# Patient Record
Sex: Male | Born: 1960 | Race: White | Hispanic: No | Smoking: Former smoker
Health system: Southern US, Community
[De-identification: ages and names within clinical notes are randomized; demographics above are authoritative.]

## PROBLEM LIST (undated history)

## (undated) DIAGNOSIS — Z86010 Personal history of colonic polyps: Secondary | ICD-10-CM

## (undated) DIAGNOSIS — I639 Cerebral infarction, unspecified: Secondary | ICD-10-CM

## (undated) DIAGNOSIS — E785 Hyperlipidemia, unspecified: Secondary | ICD-10-CM

## (undated) DIAGNOSIS — I10 Essential (primary) hypertension: Secondary | ICD-10-CM

## (undated) DIAGNOSIS — K254 Chronic or unspecified gastric ulcer with hemorrhage: Secondary | ICD-10-CM

## (undated) DIAGNOSIS — K3189 Other diseases of stomach and duodenum: Secondary | ICD-10-CM

## (undated) DIAGNOSIS — K219 Gastro-esophageal reflux disease without esophagitis: Secondary | ICD-10-CM

## (undated) DIAGNOSIS — M1990 Unspecified osteoarthritis, unspecified site: Secondary | ICD-10-CM

## (undated) DIAGNOSIS — F1910 Other psychoactive substance abuse, uncomplicated: Secondary | ICD-10-CM

## (undated) DIAGNOSIS — G4730 Sleep apnea, unspecified: Secondary | ICD-10-CM

## (undated) DIAGNOSIS — K5730 Diverticulosis of large intestine without perforation or abscess without bleeding: Secondary | ICD-10-CM

## (undated) DIAGNOSIS — D649 Anemia, unspecified: Secondary | ICD-10-CM

## (undated) HISTORY — DX: Cerebral infarction, unspecified: I63.9

## (undated) HISTORY — DX: Essential (primary) hypertension: I10

## (undated) HISTORY — DX: Diverticulosis of large intestine without perforation or abscess without bleeding: K57.30

## (undated) HISTORY — DX: Anemia, unspecified: D64.9

## (undated) HISTORY — PX: NASAL FRACTURE SURGERY: SHX718

## (undated) HISTORY — DX: Hyperlipidemia, unspecified: E78.5

## (undated) HISTORY — DX: Sleep apnea, unspecified: G47.30

## (undated) HISTORY — DX: Personal history of colonic polyps: Z86.010

## (undated) HISTORY — DX: Unspecified osteoarthritis, unspecified site: M19.90

## (undated) HISTORY — DX: Gastro-esophageal reflux disease without esophagitis: K21.9

## (undated) HISTORY — DX: Chronic or unspecified gastric ulcer with hemorrhage: K25.4

## (undated) HISTORY — DX: Other diseases of stomach and duodenum: K31.89

## (undated) HISTORY — PX: WISDOM TOOTH EXTRACTION: SHX21

---
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
## 2004-11-20 ENCOUNTER — Ambulatory Visit: Payer: Self-pay | Admitting: Internal Medicine

## 2004-11-21 ENCOUNTER — Ambulatory Visit: Payer: Self-pay | Admitting: Internal Medicine

## 2004-12-06 ENCOUNTER — Ambulatory Visit: Payer: Self-pay | Admitting: Internal Medicine

## 2005-02-20 ENCOUNTER — Ambulatory Visit: Payer: Self-pay | Admitting: Internal Medicine

## 2005-08-31 ENCOUNTER — Ambulatory Visit: Payer: Self-pay | Admitting: Internal Medicine

## 2005-09-20 ENCOUNTER — Ambulatory Visit: Payer: Self-pay | Admitting: Internal Medicine

## 2006-07-15 ENCOUNTER — Ambulatory Visit: Payer: Self-pay | Admitting: Internal Medicine

## 2006-07-15 LAB — CONVERTED CEMR LAB
ALT: 39 units/L (ref 0–40)
AST: 29 units/L (ref 0–37)
Alkaline Phosphatase: 75 units/L (ref 39–117)
BUN: 15 mg/dL (ref 6–23)
Bilirubin, Direct: 0.1 mg/dL (ref 0.0–0.3)
Creatinine, Ser: 1 mg/dL (ref 0.4–1.5)
HDL: 47.6 mg/dL (ref >39.0)
Hgb A1c MFr Bld: 5.3 % (ref 4.6–6.0)
Microalb Creat Ratio: 5.2 mg/g (ref 0.0–30.0)
Microalb, Ur: 0.8 mg/dL (ref 0.0–1.9)
Potassium: 4.6 meq/L (ref 3.5–5.1)
Total Bilirubin: 1 mg/dL (ref 0.3–1.2)
Total CK: 293 units/L (ref 7–195)
Total Protein: 7 g/dL (ref 6.0–8.3)
Triglyceride fasting, serum: 284 mg/dL (ref 0–149)
VLDL: 57 mg/dL — ABNORMAL HIGH (ref 0–40)

## 2006-08-29 ENCOUNTER — Ambulatory Visit: Payer: Self-pay | Admitting: Internal Medicine

## 2006-10-18 ENCOUNTER — Ambulatory Visit: Payer: Self-pay | Admitting: Family Medicine

## 2006-11-08 ENCOUNTER — Ambulatory Visit: Payer: Self-pay | Admitting: Internal Medicine

## 2006-11-18 ENCOUNTER — Emergency Department (HOSPITAL_COMMUNITY): Admission: EM | Admit: 2006-11-18 | Discharge: 2006-11-18 | Payer: Self-pay | Admitting: Emergency Medicine

## 2006-11-20 ENCOUNTER — Ambulatory Visit: Payer: Self-pay | Admitting: Internal Medicine

## 2006-11-26 ENCOUNTER — Ambulatory Visit: Payer: Self-pay

## 2006-11-26 ENCOUNTER — Ambulatory Visit: Payer: Self-pay | Admitting: Internal Medicine

## 2006-11-26 HISTORY — PX: CAROTID ENDARTERECTOMY: SUR193

## 2006-11-27 ENCOUNTER — Ambulatory Visit: Payer: Self-pay | Admitting: Internal Medicine

## 2006-11-27 LAB — CONVERTED CEMR LAB
AST: 26 units/L (ref 0–37)
Total CHOL/HDL Ratio: 5.7
VLDL: 48 mg/dL — ABNORMAL HIGH (ref 0–40)

## 2006-12-03 ENCOUNTER — Ambulatory Visit: Payer: Self-pay | Admitting: Vascular Surgery

## 2006-12-04 ENCOUNTER — Inpatient Hospital Stay (HOSPITAL_COMMUNITY): Admission: RE | Admit: 2006-12-04 | Discharge: 2006-12-05 | Payer: Self-pay | Admitting: Vascular Surgery

## 2006-12-04 ENCOUNTER — Encounter (INDEPENDENT_AMBULATORY_CARE_PROVIDER_SITE_OTHER): Payer: Self-pay | Admitting: Specialist

## 2006-12-04 ENCOUNTER — Ambulatory Visit: Payer: Self-pay | Admitting: Vascular Surgery

## 2007-01-13 DIAGNOSIS — E782 Mixed hyperlipidemia: Secondary | ICD-10-CM

## 2007-01-21 ENCOUNTER — Ambulatory Visit: Payer: Self-pay | Admitting: Vascular Surgery

## 2007-01-21 ENCOUNTER — Encounter: Payer: Self-pay | Admitting: Internal Medicine

## 2007-08-11 ENCOUNTER — Encounter: Payer: Self-pay | Admitting: Internal Medicine

## 2007-08-11 ENCOUNTER — Ambulatory Visit: Payer: Self-pay | Admitting: Vascular Surgery

## 2007-11-27 ENCOUNTER — Telehealth (INDEPENDENT_AMBULATORY_CARE_PROVIDER_SITE_OTHER): Payer: Self-pay | Admitting: *Deleted

## 2008-06-29 ENCOUNTER — Ambulatory Visit: Payer: Self-pay | Admitting: Internal Medicine

## 2008-08-13 ENCOUNTER — Ambulatory Visit: Payer: Self-pay | Admitting: Vascular Surgery

## 2008-08-24 ENCOUNTER — Telehealth (INDEPENDENT_AMBULATORY_CARE_PROVIDER_SITE_OTHER): Payer: Self-pay | Admitting: *Deleted

## 2008-09-01 ENCOUNTER — Encounter (INDEPENDENT_AMBULATORY_CARE_PROVIDER_SITE_OTHER): Payer: Self-pay | Admitting: *Deleted

## 2008-09-07 ENCOUNTER — Ambulatory Visit: Payer: Self-pay | Admitting: Internal Medicine

## 2008-09-21 ENCOUNTER — Encounter (INDEPENDENT_AMBULATORY_CARE_PROVIDER_SITE_OTHER): Payer: Self-pay | Admitting: *Deleted

## 2008-09-21 LAB — CONVERTED CEMR LAB
ALT: 31 units/L (ref 0–53)
AST: 29 units/L (ref 0–37)
Albumin: 4.1 g/dL (ref 3.5–5.2)
Alkaline Phosphatase: 77 units/L (ref 39–117)
BUN: 15 mg/dL (ref 6–23)
Chloride: 106 meq/L (ref 96–112)
Cholesterol: 267 mg/dL (ref 0–200)
Eosinophils Relative: 1.6 % (ref 0.0–5.0)
Glucose, Bld: 95 mg/dL (ref 70–99)
HCT: 42.6 % (ref 39.0–52.0)
Hemoglobin, Urine: NEGATIVE
Monocytes Relative: 9.7 % (ref 3.0–12.0)
Neutrophils Relative %: 57.1 % (ref 43.0–77.0)
Nitrite: NEGATIVE
Platelets: 237 10*3/uL (ref 150–400)
Potassium: 5 meq/L (ref 3.5–5.1)
RBC: 4.54 M/uL (ref 4.22–5.81)
Total CHOL/HDL Ratio: 6
Total Protein, Urine: NEGATIVE mg/dL
Total Protein: 7 g/dL (ref 6.0–8.3)
Triglycerides: 209 mg/dL (ref 0–149)
VLDL: 42 mg/dL — ABNORMAL HIGH (ref 0–40)
WBC: 8.2 10*3/uL (ref 4.5–10.5)

## 2009-02-16 ENCOUNTER — Ambulatory Visit: Payer: Self-pay | Admitting: Internal Medicine

## 2009-02-16 DIAGNOSIS — H906 Mixed conductive and sensorineural hearing loss, bilateral: Secondary | ICD-10-CM | POA: Insufficient documentation

## 2009-02-18 ENCOUNTER — Encounter (INDEPENDENT_AMBULATORY_CARE_PROVIDER_SITE_OTHER): Payer: Self-pay | Admitting: *Deleted

## 2009-03-21 ENCOUNTER — Encounter: Payer: Self-pay | Admitting: Internal Medicine

## 2009-05-01 ENCOUNTER — Ambulatory Visit (HOSPITAL_BASED_OUTPATIENT_CLINIC_OR_DEPARTMENT_OTHER): Admission: RE | Admit: 2009-05-01 | Discharge: 2009-05-01 | Payer: Self-pay | Admitting: Otolaryngology

## 2009-05-06 ENCOUNTER — Ambulatory Visit: Payer: Self-pay | Admitting: Internal Medicine

## 2009-07-01 ENCOUNTER — Telehealth (INDEPENDENT_AMBULATORY_CARE_PROVIDER_SITE_OTHER): Payer: Self-pay | Admitting: *Deleted

## 2009-08-27 DIAGNOSIS — K3189 Other diseases of stomach and duodenum: Secondary | ICD-10-CM

## 2009-08-27 DIAGNOSIS — K254 Chronic or unspecified gastric ulcer with hemorrhage: Secondary | ICD-10-CM

## 2009-08-27 HISTORY — DX: Chronic or unspecified gastric ulcer with hemorrhage: K31.89

## 2009-08-27 HISTORY — PX: COLONOSCOPY: SHX174

## 2009-08-27 HISTORY — DX: Chronic or unspecified gastric ulcer with hemorrhage: K25.4

## 2009-08-30 ENCOUNTER — Telehealth: Payer: Self-pay | Admitting: Internal Medicine

## 2009-09-05 ENCOUNTER — Encounter: Payer: Self-pay | Admitting: Internal Medicine

## 2010-01-13 ENCOUNTER — Telehealth: Payer: Self-pay | Admitting: Internal Medicine

## 2010-01-13 ENCOUNTER — Ambulatory Visit: Payer: Self-pay | Admitting: Internal Medicine

## 2010-01-13 DIAGNOSIS — D649 Anemia, unspecified: Secondary | ICD-10-CM

## 2010-01-13 DIAGNOSIS — G4730 Sleep apnea, unspecified: Secondary | ICD-10-CM | POA: Insufficient documentation

## 2010-01-13 DIAGNOSIS — R0989 Other specified symptoms and signs involving the circulatory and respiratory systems: Secondary | ICD-10-CM

## 2010-01-13 DIAGNOSIS — I10 Essential (primary) hypertension: Secondary | ICD-10-CM | POA: Insufficient documentation

## 2010-01-16 ENCOUNTER — Ambulatory Visit: Payer: Self-pay | Admitting: Internal Medicine

## 2010-01-16 ENCOUNTER — Encounter (INDEPENDENT_AMBULATORY_CARE_PROVIDER_SITE_OTHER): Payer: Self-pay | Admitting: *Deleted

## 2010-01-17 ENCOUNTER — Telehealth (INDEPENDENT_AMBULATORY_CARE_PROVIDER_SITE_OTHER): Payer: Self-pay | Admitting: *Deleted

## 2010-01-17 ENCOUNTER — Ambulatory Visit: Payer: Self-pay | Admitting: Gastroenterology

## 2010-01-17 ENCOUNTER — Encounter (INDEPENDENT_AMBULATORY_CARE_PROVIDER_SITE_OTHER): Payer: Self-pay | Admitting: *Deleted

## 2010-01-17 LAB — CONVERTED CEMR LAB
Basophils Relative: 0.4 % (ref 0.0–3.0)
Eosinophils Relative: 0 % (ref 0.0–5.0)
Folate: 9.5 ng/mL
HCT: 21.3 % — CL (ref 39.0–52.0)
Hemoglobin: 7.1 g/dL — CL (ref 13.0–17.0)
Lymphs Abs: 1.8 10*3/uL (ref 0.7–4.0)
Monocytes Relative: 8.2 % (ref 3.0–12.0)
Neutro Abs: 5.7 10*3/uL (ref 1.4–7.7)
RBC: 2.49 M/uL — ABNORMAL LOW (ref 4.22–5.81)
RDW: 18.9 % — ABNORMAL HIGH (ref 11.5–14.6)
Vitamin B-12: 384 pg/mL (ref 211–911)
WBC: 8.1 10*3/uL (ref 4.5–10.5)

## 2010-01-27 ENCOUNTER — Ambulatory Visit: Payer: Self-pay | Admitting: Gastroenterology

## 2010-01-27 LAB — HM COLONOSCOPY

## 2010-01-30 DIAGNOSIS — Z860100 Personal history of colon polyps, unspecified: Secondary | ICD-10-CM

## 2010-01-30 HISTORY — DX: Personal history of colon polyps, unspecified: Z86.0100

## 2010-01-30 HISTORY — DX: Personal history of colonic polyps: Z86.010

## 2010-02-01 ENCOUNTER — Telehealth: Payer: Self-pay | Admitting: Gastroenterology

## 2010-02-01 ENCOUNTER — Encounter: Payer: Self-pay | Admitting: Gastroenterology

## 2010-02-02 DIAGNOSIS — K5730 Diverticulosis of large intestine without perforation or abscess without bleeding: Secondary | ICD-10-CM | POA: Insufficient documentation

## 2010-02-09 ENCOUNTER — Telehealth (INDEPENDENT_AMBULATORY_CARE_PROVIDER_SITE_OTHER): Payer: Self-pay | Admitting: *Deleted

## 2010-02-10 ENCOUNTER — Ambulatory Visit: Payer: Self-pay | Admitting: Cardiology

## 2010-02-14 ENCOUNTER — Telehealth (INDEPENDENT_AMBULATORY_CARE_PROVIDER_SITE_OTHER): Payer: Self-pay | Admitting: *Deleted

## 2010-02-28 ENCOUNTER — Ambulatory Visit: Payer: Self-pay | Admitting: Gastroenterology

## 2010-02-28 DIAGNOSIS — Z860100 Personal history of colon polyps, unspecified: Secondary | ICD-10-CM | POA: Insufficient documentation

## 2010-02-28 DIAGNOSIS — K250 Acute gastric ulcer with hemorrhage: Secondary | ICD-10-CM | POA: Insufficient documentation

## 2010-03-01 LAB — CONVERTED CEMR LAB
Basophils Relative: 0.6 % (ref 0.0–3.0)
Eosinophils Absolute: 0 10*3/uL (ref 0.0–0.7)
Eosinophils Relative: 0.2 % (ref 0.0–5.0)
Ferritin: 27.6 ng/mL (ref 22.0–322.0)
Folate: 11.2 ng/mL
Lymphocytes Relative: 22.3 % (ref 12.0–46.0)
Neutrophils Relative %: 66.9 % (ref 43.0–77.0)
Platelets: 249 10*3/uL (ref 150.0–400.0)
RBC: 4.48 M/uL (ref 4.22–5.81)
Saturation Ratios: 60.6 % — ABNORMAL HIGH (ref 20.0–50.0)
Transferrin: 279.2 mg/dL (ref 212.0–360.0)
Vitamin B-12: 410 pg/mL (ref 211–911)
WBC: 8.9 10*3/uL (ref 4.5–10.5)

## 2010-03-09 ENCOUNTER — Encounter: Payer: Self-pay | Admitting: Gastroenterology

## 2010-03-09 ENCOUNTER — Telehealth: Payer: Self-pay | Admitting: Gastroenterology

## 2010-04-06 ENCOUNTER — Ambulatory Visit: Payer: Self-pay | Admitting: Internal Medicine

## 2010-06-28 ENCOUNTER — Ambulatory Visit: Payer: Self-pay | Admitting: Internal Medicine

## 2010-07-03 LAB — CONVERTED CEMR LAB
Alkaline Phosphatase: 81 units/L (ref 39–117)
Bilirubin, Direct: 0.1 mg/dL (ref 0.0–0.3)
Direct LDL: 215.4 mg/dL
Total Bilirubin: 0.7 mg/dL (ref 0.3–1.2)
Total Protein: 7 g/dL (ref 6.0–8.3)
VLDL: 37.2 mg/dL (ref 0.0–40.0)

## 2010-09-17 ENCOUNTER — Encounter: Payer: Self-pay | Admitting: Internal Medicine

## 2010-09-24 LAB — CONVERTED CEMR LAB
Alkaline Phosphatase: 85 units/L (ref 39–117)
Basophils Relative: 0.6 % (ref 0.0–3.0)
Bilirubin, Direct: 0 mg/dL (ref 0.0–0.3)
CO2: 29 meq/L (ref 19–32)
Calcium: 8.7 mg/dL (ref 8.4–10.5)
Cholesterol: 267 mg/dL — ABNORMAL HIGH (ref 0–200)
Creatinine, Ser: 0.9 mg/dL (ref 0.4–1.5)
Direct LDL: 154.4 mg/dL
Eosinophils Absolute: 0.1 10*3/uL (ref 0.0–0.7)
HDL: 59.9 mg/dL (ref >39.00)
Lymphocytes Relative: 29 % (ref 12.0–46.0)
MCHC: 33.7 g/dL (ref 30.0–36.0)
Neutrophils Relative %: 60.3 % (ref 43.0–77.0)
RBC: 2.49 M/uL — ABNORMAL LOW (ref 4.22–5.81)
Total CHOL/HDL Ratio: 4
Total Protein: 6.4 g/dL (ref 6.0–8.3)
Triglycerides: 172 mg/dL — ABNORMAL HIGH (ref 0.0–149.0)
VLDL: 34.4 mg/dL (ref 0.0–40.0)
WBC: 9.2 10*3/uL (ref 4.5–10.5)

## 2010-09-28 NOTE — Letter (Signed)
Summary: Patient Notice- Polyp Results  [facility]  [address] [place], [place] [address]   Phone: [phone]  Fax: [phone]        February 01, 2010 MRN: [id number]    Martin Archer [address] [place], [place]  [address]    Dear [name],  I am pleased to inform you that the colon polyp(s) removed during your recent colonoscopy was (were) found to be benign (no cancer detected) upon pathologic examination.  I recommend you have a repeat colonoscopy examination in 5_ years to look for recurrent polyps, as having colon polyps increases your risk for having recurrent polyps or even colon cancer in the future.  Should you develop new or worsening symptoms of abdominal pain, bowel habit changes or bleeding from the rectum or bowels, please schedule an evaluation with either your primary care physician or with me.  Additional information/recommendations:  __ No further action with gastroenterology is needed at this time. Please      follow-up with your primary care physician for your other healthcare      needs.  __ Please call [phone] to schedule a return visit to review your      situation.  __ Please keep your follow-up visit as already scheduled.  _xx_ Continue treatment plan as outlined the day of your exam.  Please call us if you are having persistent problems or have questions about your condition that have not been fully answered at this time.  Sincerely,  Sable Feil MD [facility]  This letter has been electronically signed by your physician.  Appended Document: Patient Notice- Polyp Results letter mailed.

## 2010-09-28 NOTE — Letter (Signed)
Summary: Patient Notice-Endo Biopsy Results  [facility]  [place], [place] [address]   Phone: [phone]  Fax: [phone]        February 01, 2010 MRN: [id number]    Martin Archer [address] [place], [place]  [address]    Dear [name],  I am pleased to inform you that the biopsies taken during your recent endoscopic examination did not show any evidence of cancer upon pathologic examination.  Additional information/recommendations:  __No further action is needed at this time.  Please follow-up with      your primary care physician for your other healthcare needs.  __ Please call [phone] to schedule a return visit to review      your condition.  _xx_ Continue with the treatment plan as outlined on the day of your      exam.  __ You should have a repeat endoscopic examination for this problem              in _ months/years.   Please call us if you are having persistent problems or have questions about your condition that have not been fully answered at this time.  Sincerely,  Sable Feil MD [facility]  This letter has been electronically signed by your physician.  Appended Document: Patient Notice-Endo Biopsy Results letter mailed.

## 2010-09-28 NOTE — Assessment & Plan Note (Signed)
Summary: DISCUSS DIET SUPPLEMENT AND WEIGHT GAIN/KB   Vital Signs:  Patient profile:   50 year old male Height:      67.75 inches Weight:      189 pounds BMI:     29.05 Pulse rate:   72 / minute Resp:     14 per minute BP sitting:   116 / 64  (left arm) Cuff size:   large  Vitals Entered By: Georgette Dover CMA (June 28, 2010 8:14 AM) CC: Discuss weight , Lipid Management   Primary Care Provider:  Unice Cobble, MD  CC:  Discuss weight  and Lipid Management.  History of Present Illness: Hyperlipidemia Follow-Up      This is a 50 year old man who presents for Hyperlipidemia follow-up. He stopped Crestor when the Lipids improved .Serial Lipids reviewed to 2006; dramatic improvement in TG ( 732 in 2006 & 172 most recently).The patient denies the following symptoms: chest pain/pressure, exercise intolerance, dypsnea, palpitations, syncope, and pedal edema.  Dietary compliance has been "terrible , excess sweets since I quit drinking"..  The patient reports exercising 3-4X per week as walking 28mpd .  Adjunctive measures currently used by the patient include ASA and fish oil supplements.   HTN has resolved.  Lipid Management History:      Positive NCEP/ATP III risk factors include male age 52 years old or older, family history for ischemic heart disease (females less than 29 years old & males less than 63 years old), hypertension, and peripheral vascular disease.  Negative NCEP/ATP III risk factors include non-diabetic, non-tobacco-user status, no ASHD (atherosclerotic heart disease), no prior stroke/TIA, and no history of aortic aneurysm.     Current Medications (verified): 1)  Fluoxetine Hcl 10 Mg Tabs (Fluoxetine Hcl) .Marland Kitchen.. 1 By Mouth Once Daily 2)  Tramadol Hcl 50 Mg Tabs (Tramadol Hcl) .Marland Kitchen.. 1 By Mouth Q 8 Hrs As Needed Pain 3)  Celebrex 200 Mg Caps (Celecoxib) .Marland Kitchen.. 1 Tab By Mouth Once or Twice A Day and Needed 4)  Chlordiazepoxide Hcl 5 Mg Caps (Chlordiazepoxide Hcl) .Marland Kitchen.. 1 Three  Times A Day As Needed  Allergies: 1)  ! Zoloft (Sertraline Hcl)  Past History:  Past Medical History: Hyperlipidemia: NMR 2006: TC 296, LDL could not be measured due to TG ( 3666/3188),HDL 37, TG 732 ; LDL 154 & TG 172 in 12/2009 elevated  Homocysteine; PMH of elevated  CPK on  Lipitor. Framingham Study LDL goal = < 100. Hypertension Sleep Apnea Diverticulosis  Past Surgical History: Inguinal herniorrhaphy; Wisdom Teeth Extraction; Nasal fracture set in hospital  L Carotid endarterectomy 11/2006, Dr [name]  Kellie Simmering  Family History: Father:  DM,HTN,CVA Mother: MI @ age 59,CVA, S/P carotid endarterectomy X 3 Siblings: bro: MI @ 19; PGF: DM;MGF : MI in 38s, ?cancer No FH of Colon Cancer:  Physical Exam  General:  well-nourished;alert,appropriate and cooperative throughout examination Lungs:  Normal respiratory effort, chest expands symmetrically. Lungs are clear to auscultation, no crackles or wheezes. Heart:  Normal rate and regular rhythm. S1 and S2 normal without gallop, murmur, click, rub .S4 . Pulses:  R and L carotid,radial,dorsalis pedis and posterior tibial pulses are full and equal bilaterally. L carotid bruit   Impression & Recommendations:  Problem # 1:  HYPERLIPIDEMIA (ICD-272.4)  Orders: Venipuncture [id number]) TLB-Lipid Panel (80061-LIPID) TLB-Hepatic/Liver Function Pnl (80076-HEPATIC) TLB-A1C / Hgb A1C (Glycohemoglobin) (83036-A1C)  Problem # 2:  DIABETES MELLITUS, FAMILY HX (ICD-V18.0)  Orders: Venipuncture [id number]) TLB-A1C / Hgb A1C (Glycohemoglobin) (83036-A1C)  Complete Medication  List: 1)  Fluoxetine Hcl 10 Mg Tabs (Fluoxetine hcl) .Marland Kitchen.. 1 by mouth once daily 2)  Tramadol Hcl 50 Mg Tabs (Tramadol hcl) .Marland Kitchen.. 1 by mouth q 8 hrs as needed pain 3)  Celebrex 200 Mg Caps (Celecoxib) .Marland Kitchen.. 1 tab by mouth once or twice a day and needed 4)  Chlordiazepoxide Hcl 5 Mg Caps (Chlordiazepoxide hcl) .Marland Kitchen.. 1 three times a day as needed  Other Orders: Admin 1st Vaccine  [id number]) Flu Vaccine 52yrs + [id number])  Lipid Assessment/Plan:      Based on NCEP/ATP III, the patient's risk factor category is "history of coronary disease, peripheral vascular disease, cerebrovascular disease, or aortic aneurysm".  The patient's lipid goals are as follows: Total cholesterol goal is 200; LDL cholesterol goal is 100; HDL cholesterol goal is 40; Triglyceride goal is 150.    Patient Instructions: 1)  Consider THe New Sugar Busters low carb program; avoid HFCS sugar  & hyperglycemic carbs as discussed.   Orders Added: 1)  Admin 1st Vaccine [id number] 2)  Flu Vaccine 74yrs + [id number] 3)  Est. Patient Level III [id number] 4)  Venipuncture [36415] 5)  TLB-Lipid Panel [80061-LIPID] 6)  TLB-Hepatic/Liver Function Pnl [80076-HEPATIC] 7)  TLB-A1C / Hgb A1C (Glycohemoglobin) [83036-A1C] Flu Vaccine Consent Questions     Do you have a history of severe allergic reactions to this vaccine? no    Any prior history of allergic reactions to egg and/or gelatin? no    Do you have a sensitivity to the preservative Thimersol? no    Do you have a past history of Guillan-Barre Syndrome? no    Do you currently have an acute febrile illness? no    Have you ever had a severe reaction to latex? no    Vaccine information given and explained to patient? yes    Are you currently pregnant? no    Lot Number:[id number]   Exp Date:02/24/2011   Site Given  Right Deltoid IMmin 1st Vaccine [90471] 2)  Flu Vaccine 52yrs + [id number]     .lbflu    Appended Document: DISCUSS DIET SUPPLEMENT AND WEIGHT GAIN/KB

## 2010-09-28 NOTE — Letter (Signed)
Summary: [facility] Instructions  [facility]  [place], [place] [address]   Phone: [phone]  Fax: [phone]       LENDON LANTIS    Jan 15, 1971    MRN: [id number]        Procedure Day /Date: Friday, 01/27/10     Arrival Time: 2:30      Procedure Time: 3:30     Location of Procedure:                    _ X_  [facility] (4th Floor)                         [place]   Starting 5 days prior to your procedure 01/22/10 do not eat nuts, seeds, popcorn, corn, beans, peas,  salads, or any raw vegetables.  Do not take any fiber supplements (e.g. Metamucil, Citrucel, and Benefiber).  THE DAY BEFORE YOUR PROCEDURE         DATE: 01/26/10   DAY: Thursday  1.  Drink clear liquids the entire day-NO SOLID FOOD  2.  Do not drink anything colored red or purple.  Avoid juices with pulp.  No orange juice.  3.  Drink at least 64 oz. (8 glasses) of fluid/clear liquids during the day to prevent dehydration and help the prep work efficiently.  CLEAR LIQUIDS INCLUDE: Water Jello Ice Popsicles Tea (sugar ok, no milk/cream) Powdered fruit flavored drinks Coffee (sugar ok, no milk/cream) Gatorade Juice: apple, white grape, white cranberry  Lemonade Clear bullion, consomm, broth Carbonated beverages (any kind) Strained chicken noodle soup Hard Candy                             4.  In the morning, mix first dose of MoviPrep solution:    Empty 1 Pouch A and 1 Pouch B into the disposable container    Add lukewarm drinking water to the top line of the container. Mix to dissolve    Refrigerate (mixed solution should be used within 24 hrs)  5.  Begin drinking the prep at 5:00 p.m. The MoviPrep container is divided by 4 marks.   Every 15 minutes drink the solution down to the next mark (approximately 8 oz) until the full liter is complete.   6.  Follow completed prep with 16 oz of clear liquid of your choice (Nothing  red or purple).  Continue to drink clear liquids until bedtime.  7.  Before going to bed, mix second dose of MoviPrep solution:    Empty 1 Pouch A and 1 Pouch B into the disposable container    Add lukewarm drinking water to the top line of the container. Mix to dissolve    Refrigerate  THE DAY OF YOUR PROCEDURE      DATE: 01/27/10   DAY: Friday  Beginning at 10:30 a.m. (5 hours before procedure):         1. Every 15 minutes, drink the solution down to the next mark (approx 8 oz) until the full liter is complete.  2. Follow completed prep with 16 oz. of clear liquid of your choice.    3. You may drink clear liquids until 1:30 (2 HOURS BEFORE PROCEDURE).   MEDICATION INSTRUCTIONS  Unless otherwise instructed, you should take regular prescription medications with a small sip of water   as early as possible the  morning of your procedure.                    OTHER INSTRUCTIONS  You will need a responsible adult at least 52 years of age to accompany you and drive you home.   This person must remain in the waiting room during your procedure.  Wear loose fitting clothing that is easily removed.  Leave jewelry and other valuables at home.  However, you may wish to bring a book to read or  an iPod/MP3 player to listen to music as you wait for your procedure to start.  Remove all body piercing jewelry and leave at home.  Total time from sign-in until discharge is approximately 2-3 hours.  You should go home directly after your procedure and rest.  You can resume normal activities the  day after your procedure.  The day of your procedure you should not:   Drive   Make legal decisions   Operate machinery   Drink alcohol   Return to work  You will receive specific instructions about eating, activities and medications before you leave.    The above instructions have been reviewed and explained to me by   _______________________    I fully understand and can  verbalize these instructions _____________________________ Date _________

## 2010-09-28 NOTE — Progress Notes (Signed)
Summary: Lab Results  Phone Note Outgoing Call   Call placed by: Georgette Dover,  Jan 17, 2010 8:24 AM Call placed to: Patient Summary of Call: Left message on VM informing patient of lab results(copy to be mailed):  Severre iron deficiency anemia is stable. Stay on Omeprazole two times a day pre meals. OTC iron 325 mg  daily until seen by GI consultant. Martin Archer  Jan 17, 2010 8:25 AM

## 2010-09-28 NOTE — Procedures (Signed)
Summary: Colonoscopy  Patient: Martin Archer Note: All result statuses are Final unless otherwise noted.  Tests: (1) Colonoscopy (COL)   COL Colonoscopy           [facility]     [place],   [address]           COLONOSCOPY PROCEDURE REPORT           PATIENT:  Kiernan, Bartlette  MR#:  [id number]     BIRTHDATE:  September 25, 1960, 49 yrs. old  GENDER:  male     ENDOSCOPIST:  Loralee Pacas. Sharlett Iles, MD, [facility]     REF. BY:     PROCEDURE DATE:  01/27/2010     PROCEDURE:  Colonoscopy with snare polypectomy     ASA CLASS:  Class II     INDICATIONS:  FOBT positive stool, Iron Deficiency Anemia     MEDICATIONS:   Fentanyl 75 mcg IV, Versed 7 mg IV           DESCRIPTION OF PROCEDURE:   After the risks benefits and     alternatives of the procedure were thoroughly explained, informed     consent was obtained.  Digital rectal exam was performed and     revealed no abnormalities.   The [id number] endoscope was     introduced through the anus and advanced to the cecum, which was     identified by both the appendix and ileocecal valve, without     limitations.  The quality of the prep was excellent, using     MoviPrep.  The instrument was then slowly withdrawn as the colon     was fully examined.     <<PROCEDUREIMAGES>>           FINDINGS:  Mild diverticulosis was found in the sigmoid to     descending colon segments.  A sessile polyp was found. 33mm flat     rectal polyp hot snare excised.   Retroflexed views in the rectum     revealed no abnormalities.    The scope was then withdrawn from     the patient and the procedure completed.           COMPLICATIONS:  None     ENDOSCOPIC IMPRESSION:     1) Mild diverticulosis in the sigmoid to descending colon     segments     2) Sessile polyp     r/o adenoma.     RECOMMENDATIONS:     1) high fiber diet     2) Repeat Colonoscopy in 5 years.     3) My office will arrange for you to have a CT scan of abdomen   and pelvis.     REPEAT EXAM:  No           ______________________________     Loralee Pacas. Sharlett Iles, MD, [facility]           CC:  Hendricks Limes, MD           n.     Lorrin MaisMarland Kitchen   Loralee Pacas. Patterson at 01/27/2010 03:57 PM           Pham, Quillian Quince, [id number]  Note: An exclamation mark (!) indicates a result that was not dispersed into the flowsheet. Document Creation Date: 01/27/2010 3:58 PM _______________________________________________________________________  (1) Order result status: Final Collection or observation date-time: 01/27/2010 15:49 Requested date-time:  Receipt date-time:  Reported date-time:  Referring Physician:   Ordering Physician: Verl Blalock [phone]) Specimen Source:  Source: Tawanna Cooler Order Number: [id number] Lab site:   Appended Document: Colonoscopy    Clinical Lists Changes  Problems: Added new problem of DIVERTICULOSIS, COLON (ICD-562.10) Orders: Added new Referral order of CT Abdomen/Pelvis with Contrast (CT Abd/Pelvis w/con) - Signed

## 2010-09-28 NOTE — Progress Notes (Signed)
Summary: Lab Results   Phone Note Outgoing Call Call back at [facility] Phone [phone]   Call placed by: Georgette Dover,  Jan 13, 2010 5:46 PM Call placed to: Patient Summary of Call: Dr.Hopper was informed of [place]. Per Dr.Hopper contact patient and have him complete stool cards and call for appointment to recheck Iron levels on Monday.   I left message on VM (Home Number) informing patient of Dr.Hopper's comment(s). Patient is to call for appointment to recheck labs on Monday./Chrae Malloy  Jan 13, 2010 5:48 PM   Follow-up for Phone Call        I left message on home phone again this am. 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 The stool was normal by observation. 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 CBC & dif, iron panel, B12 & folate level 05/23.GI consult . To ER if symptoms recur(dizzy, chest pain,SOB , melena or frank bleeding) Prilosec OTC bid until seen by GI.  Additional Follow-up by: Unice Cobble MD,  Jan 14, 2010 9:54 AM  New Problems: ANEMIA, SEVERE (ICD-285.9)   New Problems: ANEMIA, SEVERE (ICD-285.9)

## 2010-09-28 NOTE — Assessment & Plan Note (Signed)
Summary: cpx//lch   Vital Signs:  Patient profile:   50 year old male Height:      67.75 inches Weight:      179.0 pounds BMI:     27.52 Temp:     98.8 degrees F oral Pulse rate:   64 / minute Resp:     14 per minute BP sitting:   104 / 62  (left arm) Cuff size:   large  Vitals Entered By: Georgette Dover (Jan 13, 2010 11:30 AM)  CC: CPX with fasting labs , General Medical Evaluation, Lipid Management Comments REVIEWED MED LIST, PATIENT AGREED DOSE AND INSTRUCTION CORRECT    CC:  CPX with fasting labs , General Medical Evaluation, and Lipid Management.  History of Present Illness: Mr. dottery is here for a physical; he has had some fatigue & DOE beginning 3-4  weeks ago in context of stress.Symptoms have essentially resolved  with rest & meditation. He has been off Crestor & Benazepril several months due to cost. Carotid US not repeated for > 18 months; "I didn't want to pay $600".No colonoscopy to date; [facility] reviewed.  Lipid Management History:      Positive NCEP/ATP III risk factors include male age 30 years old or older, family history for ischemic heart disease (females less than 94 years old), hypertension, and peripheral vascular disease.  Negative NCEP/ATP III risk factors include non-diabetic, non-tobacco-user status, no ASHD (atherosclerotic heart disease), no prior stroke/TIA, and no history of aortic aneurysm.     Preventive Screening-Counseling & Management  Alcohol-Tobacco     Smoking Status: quit  Caffeine-Diet-Exercise     Does Patient Exercise: no  Allergies: 1)  ! Zoloft (Sertraline Hcl)  Past History:  Past Medical History: Hyperlipidemia ; elevated  Homocysteine; PMH of elevated  CPK on  Lipitor Hypertension Sleep Apnea  Past Surgical History: Inguinal herniorrhaphy; Wisdom Teeth Extraction; Nasal fracture set in hospital  L Carotid endarterectomy 11/2006, Dr Kellie Simmering  Family History: Father:  DM,HTN,CVA Mother: MI @ age 3,CVA, S/P carotid  endarterectomy X 3 Siblings: bro: MI @ 43; PGF: DM;MGF : MI in 78s, ?cancer  Social History: Occupation:Self Employed Former Smoker: quit 06/2006 Alcohol use-no Regular exercise-no Smoking Status:  quit Does Patient Exercise:  no  Review of Systems General:  Complains of sleep disorder; denies chills, fatigue, fever, sweats, and weight loss. Eyes:  Denies blurring, double vision, and vision loss-both eyes. ENT:  Complains of decreased hearing and ringing in ears; denies hoarseness; Chronic symptoms, no progression. CV:  Denies bluish discoloration of lips or nails, chest pain or discomfort, difficulty breathing at night, difficulty breathing while lying down, leg cramps with exertion, palpitations, shortness of breath with exertion, swelling of feet, and swelling of hands. Resp:  Denies chest pain with inspiration, coughing up blood, excessive snoring, hypersomnolence, morning headaches, pleuritic, and sputum productive; On CPAP with control. GI:  Denies abdominal pain, bloody stools, dark tarry stools, and indigestion. GU:  Denies discharge, dysuria, and hematuria. MS:  Complains of low back pain; denies joint pain, mid back pain, and thoracic pain; Rx: stretching , Aleve as needed . Derm:  Denies changes in nail beds, dryness, hair loss, and lesion(s). Neuro:  Complains of numbness and tingling; Intermittent N&T in fingertips in cold w/o frank Raynaud's. Psych:  Denies anxiety, depression, easily angered, easily tearful, and irritability. Endo:  Denies cold intolerance, excessive hunger, excessive thirst, excessive urination, and heat intolerance. Heme:  Denies abnormal bruising and bleeding. Allergy:  Denies itching eyes and sneezing.  Physical Exam  General:  well-nourished, alert,appropriate and cooperative throughout examination Head:  Normocephalic and atraumatic without obvious abnormalities. No apparent alopecia  Eyes:  No corneal or conjunctival inflammation noted. Perrla.  Funduscopic exam benign, without hemorrhages, exudates or papilledema.  Ears:  External ear exam shows no significant lesions or deformities.  Otoscopic examination reveals clear canals, tympanic membranes are intact bilaterally without bulging, retraction, inflammation or discharge. Hearing is grossly normal bilaterally. Nose:  External nasal examination shows no deformity or inflammation. Nasal mucosa are pink and moist without lesions or exudates. Mouth:  Oral mucosa and oropharynx without lesions or exudates.  Teeth in good repair. Neck:  No deformities, masses, or tenderness noted. Lungs:  Normal respiratory effort, chest expands symmetrically. Lungs are clear to auscultation, no crackles or wheezes. Heart:  Normal rate and regular rhythm. S1 and S2 normal without gallop, murmur, click, rub .S4 Abdomen:  Bowel sounds positive,abdomen soft and non-tender without masses, organomegaly or hernias noted. Rectal:  No external abnormalities noted. Normal sphincter tone. No rectal masses or tenderness. Genitalia:  Testes bilaterally descended without nodularity, tenderness or masses. No scrotal masses or lesions. No penis lesions or urethral discharge. Prostate:  Prostate gland firm and smooth, no enlargement, nodularity, tenderness, mass, asymmetry or induration. Msk:  No deformity or scoliosis noted of thoracic or lumbar spine.   Pulses:  R and L carotid,radial,dorsalis pedis and posterior tibial pulses are full and equal bilaterally. Brisk L carotid bruit @ op site Extremities:  No clubbing, cyanosis, edema, or deformity noted with normal full range of motion of all joints.   Neurologic:  alert & oriented X3 and DTRs symmetrical and normal.   Skin:  Intact without suspicious lesions or rashes Cervical Nodes:  No lymphadenopathy noted Axillary Nodes:  No palpable lymphadenopathy Inguinal Nodes:  No significant adenopathy Psych:  memory intact for recent and remote, normally interactive, good eye  contact, not anxious appearing, and not depressed appearing.      Impression & Recommendations:  Problem # 1:  ROUTINE GENERAL MEDICAL EXAM@HEALTH  CARE FACL (ICD-V70.0)  Orders: EKG w/ Interpretation (93000) Venipuncture HR:[id number]) TLB-Lipid Panel (80061-LIPID) TLB-BMP (Basic Metabolic Panel-BMET) (99991111) TLB-CBC Platelet - w/Differential (85025-CBCD) TLB-Hepatic/Liver Function Pnl (80076-HEPATIC) TLB-TSH (Thyroid Stimulating Hormone) (84443-TSH) TLB-PSA (Prostate Specific Antigen) (84153-PSA)  Problem # 2:  HYPERTENSION (ICD-401.9)  controlled off meds The following medications were removed from the medication list:    Benazepril Hcl 40 Mg Tabs (Benazepril hcl) .Marland Kitchen... Take one half tablet daily  Orders: EKG w/ Interpretation (93000)  Problem # 3:  HYPERLIPIDEMIA (ICD-272.4)  ? status The following medications were removed from the medication list:    Crestor 20 Mg Tabs (Rosuvastatin calcium) .Marland Kitchen... 1 by mouth at bedtime , must have fasting office visit for additionel refills  Orders: Venipuncture HR:[id number]) TLB-Lipid Panel (80061-LIPID)  Problem # 4:  CAROTID BRUIT, LEFT (ICD-785.9) S/P endarterectomy 2008, last Doppler 08/2008  Problem # 5:  SLEEP APNEA (ICD-780.57) controlled  Complete Medication List: 1)  Venlafaxine Hcl 75 Mg Xr24h-cap (Venlafaxine hcl) .Marland Kitchen.. 1 once daily  Lipid Assessment/Plan:      Based on NCEP/ATP III, the patient's risk factor category is "history of coronary disease, peripheral vascular disease, cerebrovascular disease, or aortic aneurysm".  The patient's lipid goals are as follows: Total cholesterol goal is 200; LDL cholesterol goal is 100; HDL cholesterol goal is 40; Triglyceride goal is 150.    Patient Instructions: 1)  Consider repeating carotid Doppler.It is important that you exercise regularly at least 20 minutes 5  times a week. If you develop chest pain, have severe difficulty breathing, or feel very tired , stop exercising  immediately and seek medical attention.Schedule a colonoscopy  to help detect colon cancer as discussed.Take an Aspirin every day.   Immunization History:  Tetanus/Td Immunization History:    Tetanus/Td:  historical (08/27/2001)

## 2010-09-28 NOTE — Letter (Signed)
Summary: Primary Care Consult Scheduled Letter  [facility] [place], [place] [address]   Phone: [phone]  Fax: [phone]      01/16/2010 MRN: [id number]  Martin Archer [address] [place], [place]  [address]    Dear Martin Archer,    We have scheduled an appointment for you.  At the recommendation of Dr. Unice Cobble, we have scheduled you for a Screening Colonoscopy with [facility].  Your initial Pre-visit with a Nurse is on 02-15-2010 at 1:30pm.  Your Screening Colonoscopy is on 03-08-2010 arrive 9:30am with Dr. Carlean Purl.  Their address is [address], [place] [address]. The office phone number is [phone].  If this appointment day and time is not convenient for you, please feel free to call the office of the doctor you are being referred to at the number listed above and reschedule the appointment.    It is important for you to keep your scheduled appointments. We are here to make sure you are given good patient care.   Thank you,    Renee, Patient Care Coordinator [place] [facility]

## 2010-09-28 NOTE — Progress Notes (Signed)
Summary: Effexor too expensive  Phone Note Call from Patient Call back at Home Phone [phone] Call back at Work Phone [phone]   Caller: Patient Summary of Call: Patient went to go pick up rx for generic Effexor an the cost went up and patient would like to change to a more affordable med. Chrae Malloy  February 09, 2010 2:51 PM    I called the patient and left message on his voicemail informed him to check his formulary (Meds covered by insurance company) by calling the number on the back of his insurance card or by going to their website, once patient has that info he can let us know and we will send a new rx./Chrae [facility]  February 09, 2010 2:53 PM   Follow-up for Phone Call        I made a follow-up call informing patient to contact us as soon as he gets the information about a covered med on his plan and we will go from there. Follow-up by: Georgette Dover,  February 10, 2010 12:34 PM

## 2010-09-28 NOTE — Assessment & Plan Note (Signed)
Summary: follow up/cbs   Vital Signs:  Patient profile:   50 year old male Weight:      175.6 pounds Pulse rate:   76 / minute Resp:     15 per minute BP sitting:   130 / 78  (left arm) Cuff size:   regular  Vitals Entered By: [place] (April 06, 2010 10:48 AM) CC: 1.) Follow-up on chlosterol  2.) Discuss getting a new RX   Primary Care Provider:  Unice Cobble, MD  CC:  1.) Follow-up on chlosterol  2.) Discuss getting a new RX.  History of Present Illness:  Hyperlipidemia Follow-Up      This is a 50 year old man who presents for Hyperlipidemia follow-up.  LDL was 154 & TG 172 in 12/2009. The patient denies the following symptoms: chest pain/pressure, exercise intolerance, dypsnea, palpitations, syncope, and pedal edema.  Dietary compliance has been good.  The patient reports no exercise.  Adjunctive measures currently used by the patient include ASA.    Current Medications (verified): 1)  Fluoxetine Hcl 10 Mg Tabs (Fluoxetine Hcl) .Marland Kitchen.. 1 By Mouth Once Daily 2)  Tramadol Hcl 50 Mg Tabs (Tramadol Hcl) .Marland Kitchen.. 1 By Mouth Q 8 Hrs As Needed Pain 3)  Celebrex 200 Mg Caps (Celecoxib) .Marland Kitchen.. 1 Tab By Mouth Once or Twice A Day and Needed  Allergies: 1)  ! Zoloft (Sertraline Hcl)  Past History:  Past Medical History: Hyperlipidemia: NMR 2006: TC 296, LDL could not be measured due to TG ( 3666/3188),HDL 37, TG 732 ; LDL 154 & TG 172 in 12/2009 elevated  Homocysteine; PMH of elevated  CPK on  Lipitor Hypertension Sleep Apnea Diverticulosis  Physical Exam  General:  well-nourished; alert,appropriate and cooperative throughout examination Lungs:  Normal respiratory effort, chest expands symmetrically. Lungs are clear to auscultation, no crackles or wheezes. Heart:  Normal rate and regular rhythm. S1 and S2 normal without gallop, murmur, click, rub or other extra sounds. Pulses:  R and L carotid,radial,dorsalis pedis and posterior tibial pulses are full and equal bilaterally.  Faint L carotid bruit Extremities:  No clubbing, cyanosis, edema.   Impression & Recommendations:  Problem # 1:  HYPERLIPIDEMIA (B2193296.4)  Problem # 2:  DIABETES MELLITUS, FAMILY HX (ICD-V18.0) F, P uncle & PGF  Complete Medication List: 1)  Fluoxetine Hcl 10 Mg Tabs (Fluoxetine hcl) .Marland Kitchen.. 1 by mouth once daily 2)  Tramadol Hcl 50 Mg Tabs (Tramadol hcl) .Marland Kitchen.. 1 by mouth q 8 hrs as needed pain 3)  Celebrex 200 Mg Caps (Celecoxib) .Marland Kitchen.. 1 tab by mouth once or twice a day and needed  Patient Instructions: 1)  Consume < 40 grams of HFCS sugar/ day as discussed.Please schedule a follow-up fasting Lab  appointment in 1 month for NMR Lipoprofile Lipid Panel  , ICD-9:272.4, 277.7 2)  HbgA1C ICD-9: V18.0.

## 2010-09-28 NOTE — Assessment & Plan Note (Signed)
Summary: SEVERE ANEMIA.Marland KitchenMarland KitchenEM    History of Present Illness Visit Type: Initial Consult Primary GI MD: Verl Blalock MD FACP FAGA Primary Provider: Unice Cobble, MD Requesting Provider: Unice Cobble, MD Chief Complaint: Severe anemia History of Present Illness:   Very Pleasant 50 year old Caucasian male referred by Dr. Unice Cobble for evaluation of new onset anemia with 2% iron saturation and hemoglobin of 7.1.  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 Tinnie Gens in in April 2008. He apparently has hyperlipidemia but is not on anti-hyperlipidemic therapy. He also has chronic low back pain and takes Aleve on a regular daily basis. He recently was prescribed Meloxicam 7.5 mg twice a day.  He denies any gastrointestinal symptomatology except for occasional black tarry stools. He has no dyspepsia, indigestion, reflux symptoms, change in bowel habits, hematochezia, or abdominal pain. Gives no history of known hepatitis, pancreatitis, other gastrointestinal issues, his family history is noncontributory. Review of chart shows a normal hemoglobin in January 2010. He currently describes fatigue, shortness of breath exertion, but no systemic complaints otherwise. He does not take frequent antacids, follows a regular diet, has had no anorexia or weight loss. He denies any cardiovascular, pulmonary, or vascular issues otherwise. He has not had previous endoscopy, colonoscopy, or barium studies of his gut.   GI Review of Systems      Denies abdominal pain, acid reflux, belching, bloating, chest pain, dysphagia with liquids, dysphagia with solids, heartburn, loss of appetite, nausea, vomiting, vomiting blood, weight loss, and  weight gain.      Reports black tarry stools.     Denies anal fissure, change in bowel habit, constipation, diarrhea, diverticulosis, fecal incontinence, heme positive  stool, hemorrhoids, irritable bowel syndrome, jaundice, light color stool, liver problems, rectal bleeding, and  rectal pain.    Current Medications (verified): 1)  Venlafaxine Hcl 75 Mg Xr24h-Cap (Venlafaxine Hcl) .Marland Kitchen.. 1 Once Daily 2)  Meloxicam 7.5 Mg Tabs (Meloxicam) .Marland Kitchen.. 1 Two Times A Day As Needed For Back Pain  Allergies (verified): 1)  ! Zoloft (Sertraline Hcl)  Past History:  Past medical, surgical, family and social histories (including risk factors) reviewed for relevance to current acute and chronic problems.  Past Medical History: Reviewed history from 01/13/2010 and no changes required. Hyperlipidemia ; elevated  Homocysteine; PMH of elevated  CPK on  Lipitor Hypertension Sleep Apnea  Past Surgical History: Reviewed history from 01/13/2010 and no changes required. Inguinal herniorrhaphy; Wisdom Teeth Extraction; Nasal fracture set in hospital  L Carotid endarterectomy 11/2006, Dr Kellie Simmering  Family History: Reviewed history from 01/13/2010 and no changes required. Father:  DM,HTN,CVA Mother: MI @ age 38,CVA, S/P carotid endarterectomy X 3 Siblings: bro: MI @ 38; PGF: DM;MGF : MI in 34s, ?cancer  Social History: Reviewed history from 01/13/2010 and no changes required. Occupation:Self Employed Former Smoker: quit 06/2006 Alcohol use-no Regular exercise-no  Review of Systems       The patient complains of allergy/sinus, anemia, back pain, fatigue, muscle pains/cramps, shortness of breath, and swelling of feet/legs.  The patient denies anxiety-new, arthritis/joint pain, blood in urine, breast changes/lumps, change in vision, confusion, cough, coughing up blood, depression-new, fainting, fever, headaches-new, hearing problems, heart murmur, heart rhythm changes, itching, menstrual pain, night sweats, nosebleeds, pregnancy symptoms, skin rash, sleeping problems, sore throat, swollen lymph glands, thirst - excessive , urination - excessive , urination changes/pain, urine  leakage, vision changes, and voice change.    Vital  Signs:  Patient profile:   50 year old male Height:      67.75 inches Weight:      178.13 pounds BMI:     27.38 Pulse rate:   80 / minute Pulse rhythm:   regular BP sitting:   120 / 76  (right arm) Cuff size:   regular  Vitals Entered By: June McMurray [place] Deborra Medina) (Jan 17, 2010 2:04 PM)  Physical Exam  General:  Well developed, well nourished, no acute distress.healthy appearing.  Pale appearing but with no stigmata of chronic liver disease. There no vascular ectasias on his lips, fingertips, or ear lobes.. Head:  Normocephalic and atraumatic. Eyes:  PERRLA, no icterus.exam deferred to patient's ophthalmologist.   Neck:  Supple; no masses or thyromegaly. Lungs:  Clear throughout to auscultation. Heart:  Regular rate and rhythm; no murmurs, rubs,  or bruits. Rectal:  Normal exam.hemocult negative.   Prostate:  .normal size prostate.   Msk:  Symmetrical with no gross deformities. Normal posture. Pulses:  Normal pulses noted. Extremities:  No clubbing, cyanosis, edema or deformities noted. Neurologic:  Alert and  oriented x4;  grossly normal neurologically. Skin:  Intact without significant lesions or rashes. Cervical Nodes:  No significant cervical adenopathy. Psych:  Alert and cooperative. Normal mood and affect.   Impression & Recommendations:  Problem # 1:  ANEMIA, SEVERE (ICD-285.9) Assessment Deteriorated His iron deficiency and associated anemia has occurred over the last 1-1/2 years. He does give a history of periodic black tarry stools, but his stool today is guaiac negative. I suspect he has a gastric ulcer from chronic NSAID use, and have discontinued all NSAIDs and meloxicam. We will substitute p.r.n. tramadol for pain, 50 mg every 6-8 hours. I scheduled him for endoscopic exam, and placed him on AcipHex 20 mg a day. He has been started on tandem iron replacement twice a day additionally. Colonoscopy has been scheduled  also to exclude colonic polyposis. Surprisingly, he is asymptomatic except for mild fatigue, and I do not think he needs an urgent transfusion. He has been advised that tandem will turn his stool black.  Problem # 2:  CAROTID BRUIT, LEFT (P7382067.9) Assessment: Improved This problem is being evaluated by Drs. Linna Darner and McDermott.  Problem # 3:  SLEEP APNEA (ICD-780.57) Assessment: Improved  Problem # 4:  HYPERTENSION (ICD-401.9) Assessment: Improved Blood Pressure today is 120/76 and pulse is 80 and regular. Is not on antihypertensive therapy at this time.  Patient Instructions: 1)  Begin Aciphex daily. 2)  Begin Tandem two times a day. 3)  Stop all arthritis medications, Mobic, ibuprofen ect. 4)  You are scheduled for a colonoscopy and endoscopy 5)  The medication list was reviewed and reconciled.  All changed / newly prescribed medications were explained.  A complete medication list was provided to the patient / caregiver. 6)  Copy sent to : Dr. Unice Cobble and Dr. Tinnie Gens  7)  Please continue current medications.  8)  Colonoscopy and Flexible Sigmoidoscopy brochure given.  9)  Conscious Sedation brochure given.  10)  Upper Endoscopy brochure given. 11)     Appended Document: SEVERE ANEMIA.Marland KitchenMarland KitchenEM    Clinical Lists Changes  Medications: Removed medication of MELOXICAM 7.5 MG TABS (MELOXICAM) 1 two times a day as needed for back pain Added new medication of TRAMADOL HCL 50 MG TABS (TRAMADOL HCL) 1 by mouth q 8 hrs as needed pain - Signed Added new medication of ACIPHEX 20 MG  TBEC (RABEPRAZOLE SODIUM) Take 1 each  day 30 minutes before meals Added new medication of TANDEM 162-115.2 MG CAPS (FERROUS FUM-IRON POLYSACCH) two times a day - Signed Added new medication of MOVIPREP 100 GM  SOLR (PEG-KCL-NACL-NASULF-NA ASC-C) As per prep instructions. - Signed Rx of TRAMADOL HCL 50 MG TABS (TRAMADOL HCL) 1 by mouth q 8 hrs as needed pain;  #60 x 3;  Signed;  Entered by: Alberteen Spindle  RN;  Authorized by: Sable Feil MD [facility];  Method used: Electronically to [facility]. [id number]*, [address], [place], [place], [place]  [address], Ph: [phone], Fax: [phone] Rx of TANDEM 162-115.2 MG CAPS (FERROUS FUM-IRON POLYSACCH) two times a day;  #60 x 6;  Signed;  Entered by: Alberteen Spindle RN;  Authorized by: Sable Feil MD [facility];  Method used: Electronically to [facility]. [id number]*, [address], [place], [place], [place]  [address], Ph: [phone], Fax: [phone] Rx of MOVIPREP 100 GM  SOLR (PEG-KCL-NACL-NASULF-NA ASC-C) As per prep instructions.;  #1 x 0;  Signed;  Entered by: Alberteen Spindle RN;  Authorized by: Sable Feil MD [facility];  Method used: Electronically to [facility]. [id number]*, [address], [place], [place], [place]  [address], Ph: [phone], Fax: [phone] Orders: Added new Test order of Colon/Endo (Colon/Endo) - Signed    Prescriptions: MOVIPREP 100 GM  SOLR (PEG-KCL-NACL-NASULF-NA ASC-C) As per prep instructions.  #1 x 0   Entered by:   Alberteen Spindle RN   Authorized by:   Sable Feil MD [facility]   Signed by:   Alberteen Spindle RN on 01/17/2010   Method used:   Electronically to        [facility]. [phone]* (retail)       [place], [place]  [address]       Ph: [phone]       Fax: [phone]   RxID:   [id number] TANDEM 162-115.2 MG CAPS (FERROUS FUM-IRON POLYSACCH) two times a day  #60 x 6   Entered by:   Alberteen Spindle RN   Authorized by:   Sable Feil MD [facility]   Signed by:   Alberteen Spindle RN on 01/17/2010   Method used:   Electronically to        [facility]. [phone]* (retail)       [place], [place]  [address]       Ph: [phone]       Fax: [phone]   RxID:   [id number] TRAMADOL HCL 50 MG TABS (TRAMADOL HCL) 1 by mouth q 8 hrs as needed pain  #60 x 3   Entered  by:   Alberteen Spindle RN   Authorized by:   Sable Feil MD [facility]   Signed by:   Alberteen Spindle RN on 01/17/2010   Method used:   Electronically to        [facility]. [phone]* (retail)       [address]       [place], [place]  [address]       Ph: [phone]       Fax: [phone]   RxID:   [id number]

## 2010-09-28 NOTE — Miscellaneous (Signed)
Summary: tandem and aciphex prescription  Clinical Lists Changes  Medications: Added new medication of ACIPHEX 20 MG  TBEC (RABEPRAZOLE SODIUM) Take 1 each day 30 minutes before meals - Signed Added new medication of TANDEM PLUS 162-115.2-1 MG  CAPS (FEFUM-FEPO-FA-B CMP-C-ZN-MN-CU) Take one capsule by mouth daily - Signed Rx of ACIPHEX 20 MG  TBEC (RABEPRAZOLE SODIUM) Take 1 each day 30 minutes before meals;  #30 x 11;  Signed;  Entered by: Joylene John RN;  Authorized by: Sable Feil MD [facility];  Method used: Electronically to [facility]. [id number]*, [address], [place], [place], [place]  [address], Ph: [phone], Fax: [phone] Rx of TANDEM PLUS 162-115.2-1 MG  CAPS (FEFUM-FEPO-FA-B CMP-C-ZN-MN-CU) Take one capsule by mouth daily;  #30 x 11;  Signed;  Entered by: Joylene John RN;  Authorized by: Sable Feil MD [facility];  Method used: Electronically to [facility]. [id number]*, [address] [place], [place], [place]  [address], Ph: [phone], Fax: [phone]    Prescriptions: TANDEM PLUS 162-115.2-1 MG  CAPS (FEFUM-FEPO-FA-B CMP-C-ZN-MN-CU) Take one capsule by mouth daily  #30 x 11   Entered by:   Joylene John RN   Authorized by:   Sable Feil MD [facility]   Signed by:   Joylene John RN on 01/27/2010   Method used:   Electronically to        [facility]. [phone]* (retail)       [place], [place]  [address]       Ph: [phone]       Fax: [phone]   RxID:   [id number] ACIPHEX 20 MG  TBEC (RABEPRAZOLE SODIUM) Take 1 each day 30 minutes before meals  #30 x 11   Entered by:   Joylene John RN   Authorized by:   Sable Feil MD [facility]   Signed by:   Joylene John RN on 01/27/2010   Method used:   Electronically to        [facility]. [phone]* (retail)       [address]       [place], [place]  [address]       Ph: [phone]       Fax: [phone]  RxID:   [id number]

## 2010-09-28 NOTE — Medication Information (Signed)
Summary: Prior Authorization & Denial for Cymbalta/[facility]  Prior Authorization & Denial for Cymbalta/[facility]   Imported By: Edmonia James 09/13/2009 13:59:10  _____________________________________________________________________  External Attachment:    Type:   Image     Comment:   External Document

## 2010-09-28 NOTE — Miscellaneous (Signed)
Summary: clotest  Clinical Lists Changes  Orders: Added new Test order of TLB-H Pylori Screen Gastric Biopsy (83013-CLOTEST) - Signed 

## 2010-09-28 NOTE — Medication Information (Signed)
Summary: Celebrex DENIED/[facility]  Celebrex DENIED/[facility]   Imported By: Phillis Knack 03/15/2010 09:40:10  _____________________________________________________________________  External Attachment:    Type:   Image     Comment:   External Document

## 2010-09-28 NOTE — Assessment & Plan Note (Signed)
Summary: Follow up one month/dfs    History of Present Illness Visit Type: Follow-up Visit Primary GI MD: Verl Blalock MD [place] Primary Provider: Unice Cobble, MD Requesting Provider: Unice Cobble, MD Chief Complaint: 1 month follow up, c/o loose stools and light in color History of Present Illness:   This patient is a 50 year old white male with chronic diverticulosis is currently asymptomatic in terms of any GI complaints. Recent evaluation was negative including CT scan of the abdomen and pelvis, upper and lower GI endoscopic exams and lab testing in early June of this year. 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   GI Review of Systems      Denies abdominal pain, acid reflux, belching, bloating, chest pain, dysphagia with liquids, dysphagia with solids, heartburn, loss of appetite, nausea, vomiting, vomiting blood, weight loss, and  weight gain.        Denies anal fissure, black tarry stools, change in bowel habit, constipation, diarrhea, diverticulosis, fecal incontinence, heme positive stool, hemorrhoids, irritable bowel syndrome, jaundice, light color stool, liver problems, rectal bleeding, and  rectal pain.    Current Medications (verified): 1)  Fluoxetine Hcl 10 Mg Tabs (Fluoxetine Hcl) .Marland Kitchen.. 1 By Mouth Once Daily 2)  Tramadol Hcl 50 Mg Tabs (Tramadol Hcl) .Marland Kitchen.. 1 By Mouth Q 8 Hrs As Needed Pain 3)  Aciphex 20 Mg  Tbec (Rabeprazole Sodium) .... Take 1 Each Day 30 Minutes Before Meals 4)  Tandem Plus 162-115.2-1 Mg  Caps (Fefum-Fepo-Fa-B Cmp-C-Zn-Mn-Cu) .... Take One Capsule By Mouth Daily  Allergies (verified): 1)  ! Zoloft (Sertraline Hcl)  Past History:  Family History: Last updated: 02/28/2010 Father:  DM,HTN,CVA Mother: MI @ age 4,CVA, S/P carotid endarterectomy X 3 Siblings: bro: MI @ 23; PGF: DM;MGF : MI in 90s, ?cancer No FH of Colon Cancer:  Social History: Last  updated: 02/28/2010 Occupation:Self Employed  salesman Former Smoker: quit 06/2006 Alcohol use-no Regular exercise-no  Past medical, surgical, family and social histories (including risk factors) reviewed for relevance to current acute and chronic problems.  Past Medical History: Hyperlipidemia ; elevated  Homocysteine; PMH of elevated  CPK on  Lipitor Hypertension Sleep Apnea Diverticulosis  Past Surgical History: Reviewed history from 01/13/2010 and no changes required. Inguinal herniorrhaphy; Wisdom Teeth Extraction; Nasal fracture set in hospital  L Carotid endarterectomy 11/2006, Dr Kellie Simmering  Family History: Reviewed history from 01/13/2010 and no changes required. Father:  DM,HTN,CVA Mother: MI @ age 87,CVA, S/P carotid endarterectomy X 3 Siblings: bro: MI @ 39; PGF: DM;MGF : MI in 18s, ?cancer No FH of Colon Cancer:  Social History: Reviewed history from 01/13/2010 and no changes required. Occupation:Self Employed  salesman Former Smoker: quit 06/2006 Alcohol use-no Regular exercise-no  Review of Systems  The patient denies allergy/sinus, anemia, anxiety-new, arthritis/joint pain, back pain, blood in urine, breast changes/lumps, change in vision, confusion, cough, coughing up blood, depression-new, fainting, fatigue, fever, headaches-new, hearing problems, heart murmur, heart rhythm changes, itching, menstrual pain, muscle pains/cramps, night sweats, nosebleeds, pregnancy symptoms, shortness of breath, skin rash, sleeping problems, sore throat, swelling of feet/legs, swollen lymph glands, thirst - excessive , urination - excessive , urination changes/pain, urine leakage, vision changes, and voice change.    Vital Signs:  Patient profile:   50 year old male Height:      67.75 inches Weight:      177 pounds BMI:     27.21 BSA:     1.94  Pulse rate:   80 / minute Pulse rhythm:   regular BP sitting:   130 / 86  (left arm)  Vitals Entered By: Masury Deborra Medina)  (February 28, 2010 8:49 AM)  Physical Exam  General:  Well developed, well nourished, no acute distress.healthy appearing.   Head:  Normocephalic and atraumatic. Eyes:  PERRLA, no icterus.exam deferred to patient's ophthalmologist.   Abdomen:  Soft, nontender and nondistended. No masses, hepatosplenomegaly or hernias noted. Normal bowel sounds. Psych:  Alert and cooperative. Normal mood and affect.   Impression & Recommendations:  Problem # 1:  ACUTE GASTRIC ULCER W/HEMORRHAGE AND OBSTRUCTION (ICD-531.01) Assessment Improved NSAID-induced ulcer has resolved with PPI therapy and he currently is asymptomatic. H. pylori exam and CT scan were negative. We will try Celebrex 200 mg one to 2 times a day as tolerated. Repeat labs also have been ordered.Continue AcipHex 20 mg a day for another month. Orders: TLB-CBC Platelet - w/Differential (85025-CBCD) TLB-B12, Serum-Total ONLY [id number]) TLB-Ferritin (AB-123456789) TLB-Folic Acid (Folate) (XX123456) TLB-IBC Pnl (Iron/FE;Transferrin) (83550-IBC)  Problem # 2:  DIVERTICULOSIS, COLON (ICD-562.10) Assessment: Improved high-fiber diet as tolerated.  Problem # 3:  ANEMIA, SEVERE (ICD-285.9) Assessment: Improved Repeat Labs Ordered. Medication adjustment per lab reports. Orders: TLB-CBC Platelet - w/Differential (85025-CBCD) TLB-B12, Serum-Total ONLY [id number]) TLB-Ferritin (AB-123456789) TLB-Folic Acid (Folate) (XX123456) TLB-IBC Pnl (Iron/FE;Transferrin) (83550-IBC)  Problem # 4:  PERSONAL HX COLONIC POLYPS (ICD-V12.72) followup as per clinical protocol. Orders: TLB-CBC Platelet - w/Differential (85025-CBCD) TLB-B12, Serum-Total ONLY [id number]) TLB-Ferritin (AB-123456789) TLB-Folic Acid (Folate) (XX123456) TLB-IBC Pnl (Iron/FE;Transferrin) (83550-IBC)  Patient Instructions: 1)  Continue Aciphex. 2)  Begin Celebrex once or twice a day as needed for back pain. 3)  Please go to the basement for lab work. 4)  The medication list was  reviewed and reconciled.  All changed / newly prescribed medications were explained.  A complete medication list was provided to the patient / caregiver. 5)  Copy sent to : Dr. Unice Cobble 6)  Please continue current medications.  Prescriptions: CELEBREX 200 MG CAPS (CELECOXIB) 1 tab by mouth once or twice a day and needed  #60 x 3   Entered by:   Alberteen Spindle RN   Authorized by:   Sable Feil MD [facility]   Signed by:   Alberteen Spindle RN on 02/28/2010   Method used:   Electronically to        [facility]. [phone]* (retail)       [address]       [place], [place]  [address]       Ph: [phone]       Fax: [phone]   RxID:   [id number]

## 2010-09-28 NOTE — Procedures (Signed)
Summary: Upper Endoscopy  Patient: [name] Note: All result statuses are Final unless otherwise noted.  Tests: (1) Upper Endoscopy (EGD)   EGD Upper Endoscopy       [facility].     [place]  [address]           ENDOSCOPY PROCEDURE REPORT           PATIENT:  Martin Archer, Martin Archer  MR#:  [id number]     BIRTHDATE:  Jul 12, 1961, 49 yrs. old  GENDER:  male           ENDOSCOPIST:  Loralee Pacas. Sharlett Iles, MD, [facility]     Referred by:           PROCEDURE DATE:  01/27/2010     PROCEDURE:  EGD with biopsy     ASA CLASS:  Class II     INDICATIONS:  FOBT + stool, iron deficiency anemia, melena HEAVY     NSAID USE.           MEDICATIONS:   There was residual sedation effect present from     prior procedure., Fentanyl 25 mcg IV, Versed 3 mg IV     TOPICAL ANESTHETIC:  Exactacain Spray           DESCRIPTION OF PROCEDURE:   After the risks benefits and     alternatives of the procedure were thoroughly explained, informed     consent was obtained.  The [facility] [id number] endoscope was     introduced through the mouth and advanced to the second portion of     the duodenum, limited by Retained food in the stomach.   The     instrument was slowly withdrawn as the mucosa was fully examined.     <<PROCEDUREIMAGES>>           An ulcer was found. 5MM HEALING PREPYLORIC ULCER WITH EDEMA OF     PYLORIC CHANNEL AND RETAINED DEBRIS AND DRIED HEME IN FUNDAL     AREA.I COULD NOT SEE ALL OF FUNDAL AREA.NO ACTIVE BLEEDING NOTED.     CLO AND REGULAR BIOPSIES DONE.  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     Retained food was present in the fundus.  The duodenal bulb was     normal in appearance, as was the postbulbar duodenum.  The     esophagus and gastroesophageal junction were completely normal in     appearance.    DEBRIS.  The scope was then withdrawn from the     patient and the procedure completed.           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     RECOMMENDATIONS:     1) Avoid NSAIDS for two weeks     2) Await biopsy results     3) Rx CLO if positive     4) continue PPI     5) follow-up: office 1 month(s)           REPEAT EXAM:  No  ______________________________     Loralee Pacas. Sharlett Iles, MD, [facility]           CC:  Hendricks Limes, MD           n.     Lorrin MaisMarland Kitchen   Loralee Pacas. Shaterra Sanzone at 01/27/2010 04:17 PM           Lengyel, Quillian Quince, [phone]  Note: An exclamation mark (!) indicates a result that was not dispersed into the flowsheet. Document Creation Date: 01/27/2010 4:18 PM _______________________________________________________________________  (1) Order result status: Final Collection or observation date-time: 01/27/2010 16:06 Requested date-time:  Receipt date-time:  Reported date-time:  Referring Physician:   Ordering Physician: Verl Blalock [phone]) Specimen Source:  Source: Tawanna Cooler Order Number: [id number] Lab site:

## 2010-09-28 NOTE — Medication Information (Signed)
Summary: Celebrex DENIED/[facility]  Celebrex DENIED/[facility]   Imported By: Phillis Knack 03/22/2010 14:52:43  _____________________________________________________________________  External Attachment:    Type:   Image     Comment:   External Document

## 2010-09-28 NOTE — Progress Notes (Signed)
Summary: prior authorization Denied  Phone Note Refill Request Message from:  Fax from Pharmacy on [facility] [address] fax [phone]  Refills Requested: Medication #1:  CYMBALTA 30 MG CPEP 1 by mouth once daily. prior authorization [id number]  Initial call taken by: Silva Bandy,  August 30, 2009 11:26 AM  Follow-up for Phone Call        prior auth in process [facility] [employer] awaiting fax............Marland KitchenFelecia Deloach CMA  August 31, 2009 8:36 AM prior auth faxed back awaiting response...........Marland KitchenFelecia Deloach CMA  September 01, 2009 9:19 AM   Additional Follow-up for Phone Call Additional follow up Details #1::        I called Aurea Graff and followed up on Prior Auth Status.   I was told Josem Kaufmann was denied and I requested a letter be sent for documentation.    Additional Follow-up by: Georgette Dover,  September 07, 2009 10:53 AM    Additional Follow-up for Phone Call Additional follow up Details #2::    Spoke with patient, he tried Zoloft in the past. Patient said the Zoloft caused some stomach issues. Patient sais he is willing to try something else.  Dr.hopper please futher advise  Follow-up by: Georgette Dover,  September 07, 2009 1:21 PM  Additional Follow-up for Phone Call Additional follow up Details #3:: Details for Additional Follow-up Action Taken: see Venlafaxine Rx  Left message on machine informing patient alternative was selected and forwarded to the pharmacy./[facility]  September 07, 2009 2:28 PM  Additional Follow-up by: Unice Cobble MD,  September 07, 2009 2:05 PM  New/Updated Medications: VENLAFAXINE HCL 75 MG XR24H-CAP (VENLAFAXINE HCL) 1 once daily Prescriptions: VENLAFAXINE HCL 75 MG XR24H-CAP (VENLAFAXINE HCL) 1 once daily  #30 x 5   Entered and Authorized by:   Unice Cobble MD   Signed by:   Georgette Dover on 09/07/2009   Method used:   Printed then faxed to ...       [facility] [phone]* (retail)       [address]       [place], [place]  [address]       Ph: [phone]       Fax: [phone]   RxID:   [id number]

## 2010-09-28 NOTE — Progress Notes (Signed)
Summary: Medication Concerns   Phone Note Call from Patient Call back at Home Phone [phone]   Caller: Patient Summary of Call: Patient faxed over information from the pharmacy about meds covered for Dr.Hopper to select alternative for Effexor  Patient aware message will be fowarded to Dr.Hopper and when he response we will send rx to [facility] and call home number and inform New RX called in. Patient said he is leaving to go out of town and needs this done quickly Initial call taken by: Georgette Dover,  February 14, 2010 10:15 AM  Follow-up for Phone Call        Patient called back and stated he didnt understand why this has taken 4 days to address his wife got confirmation that this came over Thursday  I explained to patient that Dr.Hopper has not reviewed list due to seeing patient and overload in paperwork and I will have him address quickly and get back in touch with him, patient  hung up Follow-up by: Georgette Dover,  February 14, 2010 10:21 AM  Additional Follow-up for Phone Call Additional follow up Details #1::        Per Dr.Hopper, Fluoxetine 10 mg #30 RX5  I called patient's wife at [phone] and left message on her VM informing her RX to be sent in now and apologized for the delay Additional Follow-up by: Georgette Dover,  February 14, 2010 10:27 AM    New/Updated Medications: FLUOXETINE HCL 10 MG TABS (FLUOXETINE HCL) 1 by mouth once daily Prescriptions: FLUOXETINE HCL 10 MG TABS (FLUOXETINE HCL) 1 by mouth once daily  #30 x 5   Entered by:   Georgette Dover   Authorized by:   Unice Cobble MD   Signed by:   Georgette Dover on 02/14/2010   Method used:   Electronically to        [facility]. [phone]* (retail)       [address]       [place], [place]  [address]       Ph: [phone]       Fax: [phone]   RxID:   [id number]

## 2010-09-28 NOTE — Progress Notes (Signed)
Summary: Ct scan and return OV   Phone Note Outgoing Call   Summary of Call: LM for pt to call.  Pt needs to be set up for CT scan of abd and pelvis and return OV in one month. per Dr. Sharlett Iles.  See procedure reoprts from 01/27/10. Initial call taken by: Alberteen Spindle RN,  February 01, 2010 9:10 AM  Follow-up for Phone Call        CT sch for 02/10/10 at 9 am.  LM for pt to call.   Butch Penny Surface RN  February 02, 2010 1:07 PM  Answering machine, did not leave another message. Butch Penny Surface RN  February 02, 2010 4:43 PM  LM on home phone and at work number. Contrast and instrustions at front desk.  Butch Penny Surface RN  February 03, 2010 2:11 PM  Lm for pt to call.   Follow-up by: Alberteen Spindle RN,  February 07, 2010 9:15 AM  Additional Follow-up for Phone Call Additional follow up Details #1::        Talked with pt.  Pt informed of appt for CT scan.  Instructions given. Follow up appt made. Additional Follow-up by: Alberteen Spindle RN,  February 07, 2010 10:30 AM

## 2010-09-28 NOTE — Progress Notes (Signed)
   Phone Note Outgoing Call   Call placed by: Hope Pigeon CMA,  March 09, 2010 3:04 PM Call placed to: Insurer Summary of Call: [employer] [phone] and talked to Mickel Baas and per Mickel Baas prior authorization is being reviewed and will take 48-72 hours to get approval.. Explained to Mickel Baas about patients Gastric Ulcer w Hemorrhage and had problems with other NSAIDS.      Appended Document:  Received response back from [facility] and patients prescription is not covered. If patient wants prescription he has to pay out of pocket for it because we are not a primary care doctor.

## 2010-11-16 ENCOUNTER — Other Ambulatory Visit: Payer: Self-pay | Admitting: Internal Medicine

## 2010-12-12 ENCOUNTER — Other Ambulatory Visit: Payer: Self-pay | Admitting: Physical Medicine and Rehabilitation

## 2010-12-12 DIAGNOSIS — M545 Low back pain: Secondary | ICD-10-CM

## 2010-12-14 ENCOUNTER — Ambulatory Visit
Admission: RE | Admit: 2010-12-14 | Discharge: 2010-12-14 | Disposition: A | Discharge status: Home / Self Care | Payer: BC Managed Care – PPO | Source: Ambulatory Visit | Attending: Physical Medicine and Rehabilitation | Admitting: Physical Medicine and Rehabilitation

## 2010-12-14 DIAGNOSIS — M545 Low back pain: Secondary | ICD-10-CM

## 2011-01-09 NOTE — Assessment & Plan Note (Signed)
OFFICE VISIT   Martin Archer, Martin Archer  DOB:  24-Nov-1960                                       08/11/2007  [id number]   Martin Archer underwent left carotid endarterectomy by me April 9th for  severe left internal carotid stenosis with a recent syncopal episode. He  has no new recurrent neurologic symptoms including hemiparesis, aphasia,  amaurosis fugax, diplopia, blurry vision or other syncopal episodes  since his surgery. He has continued to take one aspirin per day. He also  denies any chest pain, dyspnea on exertion, PND, orthopnea. He has no  claudication symptoms and has been feeling well.   PHYSICAL EXAMINATION:  VITAL SIGNS: Blood pressure 134/94, heart rate  89, respirations 12.  GENERAL: He is a healthy appearing middle-aged male in no apparent  distress. Carotid pulses 3+ and no audible bruits. Left neck incision  remains well healed.  NEUROLOGIC: Normal.  CHEST: Clear to auscultation.  CARDIOVASCULAR: Regular rhythm. No murmurs audible.  ABDOMEN: Soft, nontender with no masses palpable.  EXTREMITIES: He has 3+ femoral, popliteal and posterior tibial pulses  palpable bilaterally.   Carotid Duplex exam in our office reveals widely patent left internal  carotid artery at the endarterectomy site and a widely patent right  internal carotid artery. Interestingly the ultrasound done at [facility] in April had significant elevation of velocities in the right  carotid system up to 190 cm/second consistent with a 60-70% stenosis  which we did not see today. He has discontinued his smoking which was  about a pack a day for 20 years. I have encouraged him  to continue along this track and we will see him in 6 months for  followup carotid Duplex exam unless he has any symptoms in the interim.   Martin Archer, M.D.  Electronically Signed   JDL/MEDQ  D:  08/11/2007  T:  08/12/2007  Job:  [id number]   cc:   Darrick Penna. Linna Darner, MD,FACP,FCCP

## 2011-01-09 NOTE — Procedures (Signed)
CAROTID DUPLEX EXAM   INDICATION:  Follow-up evaluation of known carotid artery disease.   HISTORY:  Diabetes:  No.  Cardiac:  No.  Hypertension:  Yes.  Smoking:  Recently quit.  Previous Surgery:  Left carotid endarterectomy with Dacron patch  angioplasty on December 04, 2006 by Dr. Kellie Simmering.  CV History:  Patient reports some numbness at the surgical site.  Amaurosis Fugax No paresthesias No, Hemiparesis No                                       RIGHT             LEFT  Brachial systolic pressure:         136               138  Brachial Doppler waveforms:         Triphasic         Triphasic  Vertebral direction of flow:        Antegrade         Antegrade  DUPLEX VELOCITIES (cm/sec)  CCA peak systolic                   64                83  ECA peak systolic                   97                123456  ICA peak systolic                   69                65  ICA end diastolic                   25                21  PLAQUE MORPHOLOGY:                  Soft              None  PLAQUE AMOUNT:                      Mild              None  PLAQUE LOCATION:                    Proximal ICA      None   IMPRESSION:  1. 20-39% right internal carotid artery stenosis.  2. No left internal carotid artery stenosis, status post      endarterectomy.   ___________________________________________  Nelda Severe Kellie Simmering, M.D.   MC/MEDQ  D:  08/11/2007  T:  08/12/2007  Job:  [id number]

## 2011-01-09 NOTE — Procedures (Signed)
CAROTID DUPLEX EXAM   INDICATION:  Followup evaluation of known carotid artery disease.   HISTORY:  Diabetes:  No.  Cardiac:  No.  Hypertension:  Yes.  Smoking:  No.  Previous Surgery:  Left carotid endarterectomy with Dacron patch  angioplasty on 12/04/2006 by Dr. Kellie Simmering.  CV History:  No.  Amaurosis Fugax No, Paresthesias No, Hemiparesis No                                       RIGHT             LEFT  Brachial systolic pressure:         146               160  Brachial Doppler waveforms:         Within normal limits                Within normal limits  Vertebral direction of flow:        Antegrade         Antegrade  DUPLEX VELOCITIES (cm/sec)  CCA peak systolic                   97                95  ECA peak systolic                   119               [id number]  ICA peak systolic                   70                74  ICA end diastolic                   34                25  PLAQUE MORPHOLOGY:                  Heterogenous  PLAQUE AMOUNT:                      Mild  PLAQUE LOCATION:                    Bifurcation ICA   IMPRESSION:  1. 20-39% stenosis of the right internal carotid artery.  2. Patent left internal carotid artery with no evidence of restenosis.   ___________________________________________  Nelda Severe Kellie Simmering, M.D.   AC/MEDQ  D:  08/13/2008  T:  08/13/2008  Job:  [id number]
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
## 2011-03-04 ENCOUNTER — Telehealth: Payer: Self-pay | Admitting: Gastroenterology

## 2011-03-04 NOTE — Telephone Encounter (Signed)
Wife called today: he had black loose stool, vomiting dark material.  Slightly dizzy.  I strongly advised going to ER.  She isn't sure he'll be willing and so I told her at least he should stop NSAIDs (was taking celebrex and ASA recently) start BID PPI (OTC).

## 2011-03-04 NOTE — Telephone Encounter (Signed)
Call monday

## 2011-03-05 ENCOUNTER — Ambulatory Visit (INDEPENDENT_AMBULATORY_CARE_PROVIDER_SITE_OTHER): Payer: BC Managed Care – PPO | Admitting: Gastroenterology

## 2011-03-05 ENCOUNTER — Other Ambulatory Visit: Payer: Self-pay | Admitting: Gastroenterology

## 2011-03-05 ENCOUNTER — Other Ambulatory Visit (INDEPENDENT_AMBULATORY_CARE_PROVIDER_SITE_OTHER): Payer: BC Managed Care – PPO

## 2011-03-05 ENCOUNTER — Telehealth: Payer: Self-pay | Admitting: Gastroenterology

## 2011-03-05 ENCOUNTER — Encounter: Payer: Self-pay | Admitting: Gastroenterology

## 2011-03-05 DIAGNOSIS — K920 Hematemesis: Secondary | ICD-10-CM

## 2011-03-05 DIAGNOSIS — K259 Gastric ulcer, unspecified as acute or chronic, without hemorrhage or perforation: Secondary | ICD-10-CM

## 2011-03-05 DIAGNOSIS — K921 Melena: Secondary | ICD-10-CM

## 2011-03-05 LAB — IBC PANEL: Iron: 34 ug/dL — ABNORMAL LOW (ref 42–165)

## 2011-03-05 LAB — TSH: TSH: 1.51 u[IU]/mL (ref 0.35–5.50)

## 2011-03-05 LAB — BASIC METABOLIC PANEL
CO2: 27 mEq/L (ref 19–32)
Calcium: 9.2 mg/dL (ref 8.4–10.5)
Creatinine, Ser: 0.8 mg/dL (ref 0.4–1.5)

## 2011-03-05 LAB — CBC WITH DIFFERENTIAL/PLATELET
Basophils Absolute: 0 10*3/uL (ref 0.0–0.1)
Basophils Relative: 0.4 % (ref 0.0–3.0)
Eosinophils Absolute: 0 10*3/uL (ref 0.0–0.7)
Eosinophils Relative: 0 % (ref 0.0–5.0)
Hemoglobin: 8.8 g/dL — ABNORMAL LOW (ref 13.0–17.0)
Lymphocytes Relative: 28.7 % (ref 12.0–46.0)
Lymphs Abs: 2.7 10*3/uL (ref 0.7–4.0)
MCHC: 34.1 g/dL (ref 30.0–36.0)
MCV: 85.4 fl (ref 78.0–100.0)
Neutrophils Relative %: 61.9 % (ref 43.0–77.0)
RBC: 3.04 Mil/uL — ABNORMAL LOW (ref 4.22–5.81)
WBC: 9.5 10*3/uL (ref 4.5–10.5)

## 2011-03-05 LAB — HEPATIC FUNCTION PANEL
ALT: 28 U/L (ref 0–53)
Total Bilirubin: 0.4 mg/dL (ref 0.3–1.2)

## 2011-03-05 LAB — FERRITIN: Ferritin: 40.1 ng/mL (ref 22.0–322.0)

## 2011-03-05 LAB — LIPASE: Lipase: 30 U/L (ref 11.0–59.0)

## 2011-03-05 MED ORDER — DEXLANSOPRAZOLE 60 MG PO CPDR: 60.0000 mg | DELAYED_RELEASE_CAPSULE | Freq: Every day | ORAL | Status: DC

## 2011-03-05 NOTE — Telephone Encounter (Signed)
Patient states that he worked outside and had black stools and vomited black yesterday. His stool is still black this morning. He started  PPI but is taking two in the morning not one bid. He will come in today to be seen at 3pm

## 2011-03-05 NOTE — Patient Instructions (Signed)
Please go to the basement today for your labs.  We have given you Dexilant samples, please take one tablet 30 min before breakfast. Make office visit for 2-3 weeks.

## 2011-03-05 NOTE — Progress Notes (Signed)
This is a 50 year old Caucasian male who had the sudden onset yesterday of emesis of coffee ground material with some melanotic stools. He called the physician on call and was placed on over-the-counter Prilosec twice a day with discontinuation of his Celebrex which he takes for chronic back pain. He currently doing well and denies any dizziness, cardiovascular or general medical issues. He also takes when necessary tramadol for pain. He had a formed bowel movement today that was dark but not tarry. The patient does smoke and uses ethanol in heavy amounts. However, he denies a history of pancreatitis or hepatitis. Previous endoscopy one year ago did show bleeding prepyloric ulceration with negative biopsies for H. pylori. He denies gastrointestinal issues otherwise such as abdominal pain, nausea vomiting, fever chills.  Current Medications, Allergies, Past Medical History, Past Surgical History, Family History and Social History were reviewed in [employer] record.  Pertinent Review of Systems Negative   Physical Exam: Awake and alert in no distress appearing his stated age. I cannot appreciate stigmata of chronic liver disease. His chest entirely clear he is in a regular rhythm without murmurs gallops or rubs. His abdomen shows no organomegaly, masses or tenderness. Bowel sounds are normal. Rectal exam shows dark stool which is guaiac positive. Extremities are unremarkable mental status is normal.    Assessment and Plan: Probable exacerbation of his NSAID-induced prepyloric ulceration. I have stopped all NSAIDs including Celebrex, placed him on Dexilant 60 mg a day, have ordered CBC and labs for review, and we'll see him back in 2-3 weeks' time. He is to call if he has further bleeding or difficulties. He may need followup endoscopic exam. Liver profile, amylase and lipase ordered per his history of periodic binge alcohol abuse. Does not sound like he currently has hepatitis or  pancreatitis. However, I've urged avoidance of ethanol pending therapy as mentioned above. Encounter Diagnosis  Name Primary?  . Gastric ulcer Yes

## 2011-03-06 ENCOUNTER — Telehealth: Payer: Self-pay | Admitting: *Deleted

## 2011-03-06 DIAGNOSIS — D649 Anemia, unspecified: Secondary | ICD-10-CM

## 2011-03-06 NOTE — Telephone Encounter (Signed)
Message copied by Sheral Flow on Tue Mar 06, 2011 11:43 AM ------      Message from: Sharlett Iles, DAVID R      Created: Tue Mar 06, 2011 10:25 AM       REPEAT CBC 1 WEEK.Marland KitchenMarland KitchenMarland Kitchen

## 2011-03-06 NOTE — Telephone Encounter (Signed)
Pt aware he will come for cbc 03/12/2011

## 2011-03-12 ENCOUNTER — Other Ambulatory Visit (INDEPENDENT_AMBULATORY_CARE_PROVIDER_SITE_OTHER): Payer: BC Managed Care – PPO

## 2011-03-12 DIAGNOSIS — D649 Anemia, unspecified: Secondary | ICD-10-CM

## 2011-03-12 LAB — CBC WITH DIFFERENTIAL/PLATELET
Basophils Relative: 0.3 % (ref 0.0–3.0)
Eosinophils Relative: 0 % (ref 0.0–5.0)
HCT: 26.6 % — ABNORMAL LOW (ref 39.0–52.0)
Hemoglobin: 8.8 g/dL — ABNORMAL LOW (ref 13.0–17.0)
Lymphs Abs: 1.9 10*3/uL (ref 0.7–4.0)
Monocytes Relative: 12.1 % — ABNORMAL HIGH (ref 3.0–12.0)
Neutro Abs: 6.5 10*3/uL (ref 1.4–7.7)
RBC: 3.13 Mil/uL — ABNORMAL LOW (ref 4.22–5.81)
RDW: 18.1 % — ABNORMAL HIGH (ref 11.5–14.6)
WBC: 9.6 10*3/uL (ref 4.5–10.5)

## 2011-03-26 ENCOUNTER — Telehealth: Payer: Self-pay | Admitting: Gastroenterology

## 2011-03-26 MED ORDER — DEXLANSOPRAZOLE 60 MG PO CPDR: 60.0000 mg | DELAYED_RELEASE_CAPSULE | Freq: Every day | ORAL | Status: DC

## 2011-03-26 NOTE — Telephone Encounter (Signed)
Left message that he should continue the medication and i have sent an rx to his pharmacy

## 2011-03-27 ENCOUNTER — Other Ambulatory Visit: Payer: Self-pay | Admitting: *Deleted

## 2011-03-27 ENCOUNTER — Other Ambulatory Visit: Payer: Self-pay | Admitting: Gastroenterology

## 2011-03-27 MED ORDER — DEXLANSOPRAZOLE 60 MG PO CPDR: 60.0000 mg | DELAYED_RELEASE_CAPSULE | Freq: Every day | ORAL | Status: DC

## 2011-03-27 MED ORDER — ESOMEPRAZOLE MAGNESIUM 40 MG PO CPDR: 40.0000 mg | DELAYED_RELEASE_CAPSULE | Freq: Every day | ORAL | Status: DC

## 2011-03-27 NOTE — Telephone Encounter (Signed)
rx for Nexium sent due to insurance it will only cover Nexium or generic PPI not Dexilant. Pt aware and Nexium sent to the pharm.

## 2011-03-27 NOTE — Telephone Encounter (Signed)
rx resent  

## 2011-04-03 ENCOUNTER — Ambulatory Visit: Payer: BC Managed Care – PPO | Admitting: Gastroenterology

## 2011-04-13 ENCOUNTER — Telehealth: Payer: Self-pay | Admitting: *Deleted

## 2011-04-13 ENCOUNTER — Encounter: Payer: Self-pay | Admitting: Gastroenterology

## 2011-04-13 ENCOUNTER — Ambulatory Visit (INDEPENDENT_AMBULATORY_CARE_PROVIDER_SITE_OTHER): Payer: BC Managed Care – PPO | Admitting: Gastroenterology

## 2011-04-13 ENCOUNTER — Other Ambulatory Visit (INDEPENDENT_AMBULATORY_CARE_PROVIDER_SITE_OTHER): Payer: BC Managed Care – PPO

## 2011-04-13 VITALS — BP 126/82 | HR 80 | Ht 68.0 in | Wt 185.0 lb

## 2011-04-13 DIAGNOSIS — D649 Anemia, unspecified: Secondary | ICD-10-CM

## 2011-04-13 LAB — CBC WITH DIFFERENTIAL/PLATELET
Basophils Absolute: 0.1 10*3/uL (ref 0.0–0.1)
Basophils Relative: 0.9 % (ref 0.0–3.0)
Eosinophils Absolute: 0 10*3/uL (ref 0.0–0.7)
Lymphocytes Relative: 29.3 % (ref 12.0–46.0)
MCHC: 31.3 g/dL (ref 30.0–36.0)
MCV: 76.6 fl — ABNORMAL LOW (ref 78.0–100.0)
Monocytes Absolute: 0.9 10*3/uL (ref 0.1–1.0)
Neutro Abs: 4.4 10*3/uL (ref 1.4–7.7)
Neutrophils Relative %: 57.7 % (ref 43.0–77.0)
RDW: 20.2 % — ABNORMAL HIGH (ref 11.5–14.6)

## 2011-04-13 LAB — IBC PANEL
Saturation Ratios: 4.5 % — ABNORMAL LOW (ref 20.0–50.0)
Transferrin: 314.9 mg/dL (ref 212.0–360.0)

## 2011-04-13 NOTE — Progress Notes (Signed)
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 He currently is asymptomatic all Nexium 40 mg a day, Celebrex 200 mg a day, and when necessary tramadol. Previous evaluations for H. pylori have been negative. He also has been on oral iron replacement therapy. He specifically denies melena, hematochezia, nausea vomiting, hepatobiliary or general medical problems.    Current Medications, Allergies, Past Medical History, Past Surgical History, Family History and Social History were reviewed in [employer] record.   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 He currently is doing extremely well on the above mentioned regime. We will continue current medications and repeat his CBC and iron studies. I've asked him to avoid regular insulin as otherwise in the call us if he has future problems. Encounter Diagnosis  Name Primary?  Marland Kitchen Anemia Yes

## 2011-04-13 NOTE — Telephone Encounter (Signed)
Message copied by Lance Morin on Fri Apr 13, 2011  4:32 PM ------      Message from: Sharlett Iles, [place] R      Created: Fri Apr 13, 2011  3:42 PM       NEEDS IV IRON INFUSION...Marland Kitchen

## 2011-04-13 NOTE — Patient Instructions (Signed)
Please go to the basement today for your labs.   

## 2011-04-16 NOTE — Telephone Encounter (Signed)
Late Entry  04/13/11 1700      Spoke with pt to inform him per Dr Sharlett Iles, he needs IV Iron infusions. Pt wasn't sure he had been taking PO iron long enough for it's efficacy. Our records show he was to take Tandem, but he's not sure his insurance would pay for it. Pt will call back on 04/16/11 to discuss.

## 2011-04-17 ENCOUNTER — Telehealth: Payer: Self-pay | Admitting: Gastroenterology

## 2011-04-17 DIAGNOSIS — D649 Anemia, unspecified: Secondary | ICD-10-CM

## 2011-04-17 MED ORDER — FERROUS FUM-IRON POLYSACCH 162-115.2 MG PO CAPS: 1.0000 | ORAL_CAPSULE | Freq: Every day | ORAL | Status: DC

## 2011-04-17 NOTE — Telephone Encounter (Signed)
Notified pt Dr Sharlett Iles stated it's ok to try the Tandem for about 6 weeks and then recheck his Ferritin, IBC and CBC. I will call him the week he needs to have his labs drawn and remind him; pt stated understanding.

## 2011-04-17 NOTE — Telephone Encounter (Signed)
Spoke with the pt on 04/13/11 to inform him he needs IV Iron infusions d/t his labs; Dr Sharlett Iles determined the oral iron wasn't helping. On Friday, pt stated he would call me back , but he didn't think he was taking any iron supplements. Pt called this am to report the only med he was taking from [place] is Nexium. He would like to try the Tandem and have his levels rechecked to see if it helps. Is this OK Dr Sharlett Iles and when should we order labs again? Thanks.

## 2011-04-17 NOTE — Telephone Encounter (Signed)
Message copied by Lance Morin on Tue Apr 17, 2011  9:30 AM ------      Message from: Sharlett Iles, [place] R      Created: Fri Apr 13, 2011  3:42 PM       NEEDS IV IRON INFUSION...Marland Kitchen

## 2011-04-17 NOTE — Telephone Encounter (Signed)
ok 

## 2011-05-21 ENCOUNTER — Telehealth: Payer: Self-pay | Admitting: *Deleted

## 2011-05-21 DIAGNOSIS — D649 Anemia, unspecified: Secondary | ICD-10-CM

## 2011-05-21 NOTE — Telephone Encounter (Signed)
lmom for pt to call back. If he took the Tandem, he needs repeat labs around 05/28/11.

## 2011-05-22 NOTE — Telephone Encounter (Signed)
Spoke with pt who stated he is taking the Tandem. Labs ordered and pt will come in 05/28/11 or later; pt stated understanding.

## 2011-08-16 ENCOUNTER — Other Ambulatory Visit: Payer: Self-pay | Admitting: Gastroenterology

## 2011-08-16 NOTE — Telephone Encounter (Signed)
lmom for pt to call back. He was given Tandem and was supposed to come in around October, 2012 to check it's efficacy.

## 2011-09-03 ENCOUNTER — Other Ambulatory Visit: Payer: Self-pay | Admitting: Internal Medicine

## 2011-09-04 NOTE — Telephone Encounter (Signed)
Patient needs to schedule an annual exam

## 2011-11-18 ENCOUNTER — Other Ambulatory Visit: Payer: Self-pay | Admitting: Internal Medicine

## 2012-04-08 ENCOUNTER — Encounter: Payer: Self-pay | Admitting: Internal Medicine

## 2012-04-08 ENCOUNTER — Ambulatory Visit (INDEPENDENT_AMBULATORY_CARE_PROVIDER_SITE_OTHER): Payer: BC Managed Care – PPO | Admitting: Internal Medicine

## 2012-04-08 VITALS — BP 122/86 | HR 97 | Temp 98.6°F | Resp 14 | Ht 67.0 in | Wt 187.4 lb

## 2012-04-08 DIAGNOSIS — E785 Hyperlipidemia, unspecified: Secondary | ICD-10-CM

## 2012-04-08 DIAGNOSIS — Z0000 Encounter for general adult medical examination without abnormal findings: Secondary | ICD-10-CM

## 2012-04-08 NOTE — Progress Notes (Signed)
  Subjective:    Patient ID: Martin Archer, male    DOB: [birth year], 51 y.o.   MRN: [id number]  HPI [name]  is here for a physical; he denies acute issues.                   Review of Systems Patient reports no  vision changes,anorexia, fever ,adenopathy, persistant / recurrent hoarseness, swallowing issues, chest pain,palpitations, edema,persistant / recurrent cough, hemoptysis, dyspnea(rest, exertional, paroxysmal nocturnal), gastrointestinal  bleeding (melena, rectal bleeding), abdominal pain, excessive heart burn, GU symptoms( dysuria, hematuria, pyuria, voiding/incontinence  issues) syncope, focal weakness, memory loss,numbness & tingling, skin/hair/nail changes,or  abnormal bruising/bleeding.  He did have a 16 pound weight gain, but he is attempting to address this with low carb diet program  He  has chronic low back problems. Dr. Nelva Bush has performed epidural steroid injections and prescribed Norco as needed. He is not on nonsteroidals because of his history of GI bleed.  He is on fluoxetine with good control of depression.     Objective:   Physical ExamGen.: Healthy and well-nourished in appearance. Alert, appropriate and cooperative throughout exam. Head: Normocephalic without obvious abnormalities; no alopecia  Eyes: No corneal or conjunctival inflammation noted. Pupils equal round reactive to light and accommodation. Fundal exam is benign without hemorrhages, exudate, papilledema. Extraocular motion intact. Vision grossly normal. Ears:  Hearing aids bilaterally. Nose: External nasal exam reveals no deformity or inflammation. Nasal mucosa are pink and moist. No lesions or exudates noted.   Mouth: Oral mucosa and oropharynx reveal no lesions or exudates. Teeth in good repair. Neck: No deformities, masses, or tenderness noted. Range of motion & Thyroid  normal Lungs: Normal respiratory effort; chest expands symmetrically. Lungs are clear to auscultation without rales, wheezes,  or increased work of breathing. Heart: Normal rate and rhythm. Normal S1 and S2. No gallop, click, or rub. S4 w/o murmur. Abdomen: Bowel sounds normal; abdomen soft and nontender. No masses, organomegaly or hernias noted. Genitalia/ DRE: Genitalia normal except for left varices . Right prostate is normal; left is upper limits of normal. No nodules or induration present Musculoskeletal/extremities: No deformity or scoliosis noted of  the thoracic or lumbar spine. No clubbing, cyanosis, edema, or deformity noted. Range of motion  normal .Tone & strength  normal.Joints normal. Nail health  good. Vascular: Carotid, radial artery, dorsalis pedis and  posterior tibial pulses are full and equal. Very faint L carotid  bruit present. Neurologic: Alert and oriented x3. Deep tendon reflexes symmetrical and normal.          Skin: Intact without suspicious lesions or rashes. Lymph: No cervical, axillary, or inguinal lymphadenopathy present. Psych: Mood and affect are normal. Normally interactive                                                                                         Assessment & Plan:  #1 comprehensive physical exam; no acute findings #2 see Problem List with Assessments & Recommendations Plan: see Orders

## 2012-04-08 NOTE — Patient Instructions (Addendum)
Preventive Health Care: Exercise at least 30-45 minutes a day,  3-4 days a week.  Eat a low-fat diet with lots of fruits and vegetables, up to 7-9 servings per day.  Consume less than 40 grams of sugar per day from foods & drinks with High Fructose Corn Sugar as # 1,2,3 or # 4 on label. Please  schedule fasting Labs : BMET,Lipids, hepatic panel, CBC & dif, TSH. PLEASE BRING THESE INSTRUCTIONS TO FOLLOW UP  LAB APPOINTMENT.This will guarantee correct labs are drawn, eliminating need for repeat blood sampling ( needle sticks ! ). Diagnoses /Codes: V70.0.  Please do not use Q-tips as we discussed. Should wax build up occur, please put 2-3 drops of mineral oil in the ear at night and cover the canal with a  cotton ball.In the morning fill the canal with hydrogen peroxide & leave  for 10-15 minutes.Following this shower and use the thinnest washrag available to wick out the wax.  Please try to go on My Chart  to allow me to release the results directly to you.

## 2012-04-09 ENCOUNTER — Other Ambulatory Visit: Payer: BC Managed Care – PPO

## 2012-05-19 ENCOUNTER — Ambulatory Visit (INDEPENDENT_AMBULATORY_CARE_PROVIDER_SITE_OTHER): Payer: BC Managed Care – PPO | Admitting: Internal Medicine

## 2012-05-19 ENCOUNTER — Encounter: Payer: Self-pay | Admitting: Internal Medicine

## 2012-05-19 VITALS — BP 124/78 | HR 101 | Temp 98.4°F | Wt 183.8 lb

## 2012-05-19 DIAGNOSIS — J209 Acute bronchitis, unspecified: Secondary | ICD-10-CM

## 2012-05-19 DIAGNOSIS — J019 Acute sinusitis, unspecified: Secondary | ICD-10-CM

## 2012-05-19 MED ORDER — HYDROCODONE-HOMATROPINE 5-1.5 MG/5ML PO SYRP: 5.0000 mL | ORAL_SOLUTION | Freq: Four times a day (QID) | ORAL | Status: DC | PRN

## 2012-05-19 MED ORDER — AMOXICILLIN 500 MG PO CAPS: 500.0000 mg | ORAL_CAPSULE | Freq: Three times a day (TID) | ORAL | Status: DC

## 2012-05-19 NOTE — Progress Notes (Signed)
  Subjective:    Patient ID: Martin Archer, male    DOB: 11-12-1960, 51 y.o.   MRN: [id number]  HPI Symptoms began as laryngitis and scratchy throat 05/15/12; these symptoms have persisted.  As of 9/20 he described "flulike symptoms" with nausea, headache, and nonproductive cough.  He describes malaise and stayed in bed most of the weekend. He felt somewhat better last night, but symptoms have recurred.  He also has had cold sweats.  History the symptoms with aspirin, Robitussin cough syrup, and Mucinex PM with some benefit     Review of Systems He has had frontal pressure/pain more so than in the maxillary sinuses. He has had some yellow nasal discharge.     Objective:   Physical Exam General appearance:good health ;well nourished; no acute distress or increased work of breathing is present.  No  lymphadenopathy about the head, neck, or axilla noted.   Eyes: No conjunctival inflammation or lid edema is present.   Ears:  External ear exam shows no significant lesions or deformities.  Otoscopic examination reveals clear canals, tympanic membranes are intact bilaterally without bulging, retraction, inflammation or discharge.Decreased hearing; not wearing aids  Nose:  External nasal examination shows no deformity or inflammation. Nasal mucosa are pink and moist without lesions or exudates. No septal dislocation or deviation.No obstruction to airflow.   Oral exam: Dental hygiene is good; lips and gums are healthy appearing.There is mild oropharyngeal erythema ; exudate noted. Slightly hoarse  Neck:  No deformities,  masses, or tenderness noted.     Heart:  Normal rate and regular rhythm. S1 and S2 normal without gallop, murmur, click, rub . S 4 w/oextra sounds.   Lungs:Chest clear to auscultation; no wheezes, rhonchi,rales ,or rubs present.No increased work of breathing.    Extremities:  No cyanosis, edema, or clubbing  noted    Skin: Warm but slightly dampy w/o jaundice or  tenting.          Assessment & Plan:  #1 sinusitis #2 bronchitis Plan: See orders and recommendations

## 2012-05-19 NOTE — Patient Instructions (Addendum)
Plain Mucinex for thick secretions ;force NON dairy fluids . Use a Neti pot daily as needed for sinus congestion; going from open side to congested side . Nasal cleansing in the shower as discussed. Make sure that all residual soap is removed to prevent irritation.

## 2012-07-22 ENCOUNTER — Telehealth: Payer: Self-pay | Admitting: Internal Medicine

## 2012-07-22 NOTE — Telephone Encounter (Signed)
Lmovm for pt to call office.

## 2012-07-22 NOTE — Telephone Encounter (Signed)
Message copied by Dorian Pod on Tue Jul 22, 2012  9:34 AM ------      Message from: Hendricks Limes      Created: Sun Jul 20, 2012  2:07 PM       I am unable to complete his wellness form as updated cholesterol and glucose values are  required. These had been requested in August      Please  schedule fasting Labs : BMET,Lipids, hepatic panel, TSH. V70.0

## 2012-07-29 NOTE — Telephone Encounter (Signed)
Pt spouse called to check on status of wellness form. I advised her we would not be able to complete it without the patient having labs done. Pts spouse voiced understanding.

## 2012-11-27 ENCOUNTER — Encounter: Payer: Self-pay | Admitting: Internal Medicine

## 2012-11-27 ENCOUNTER — Ambulatory Visit (INDEPENDENT_AMBULATORY_CARE_PROVIDER_SITE_OTHER): Payer: BC Managed Care – PPO | Admitting: Internal Medicine

## 2012-11-27 ENCOUNTER — Telehealth: Payer: Self-pay | Admitting: Internal Medicine

## 2012-11-27 VITALS — BP 128/80 | HR 101 | Temp 98.2°F | Wt 188.0 lb

## 2012-11-27 DIAGNOSIS — R197 Diarrhea, unspecified: Secondary | ICD-10-CM

## 2012-11-27 DIAGNOSIS — A084 Viral intestinal infection, unspecified: Secondary | ICD-10-CM

## 2012-11-27 DIAGNOSIS — A088 Other specified intestinal infections: Secondary | ICD-10-CM

## 2012-11-27 DIAGNOSIS — J310 Chronic rhinitis: Secondary | ICD-10-CM

## 2012-11-27 NOTE — Telephone Encounter (Signed)
Patient Information:  Caller Name: Linna Hoff  Phone: [phone]  Patient: Martin Archer  Gender: Male  DOB: Nov 18, 1960  Age: 52 Years  PCP: Unice Cobble  Office Follow Up:  Does the office need to follow up with this patient?: Yes  Instructions For The Office: Error message when tried to schedule appointment with Dr. Linna Darner.  "The PM session's visit type limit for ACUTE with HOPPER, Darrick Penna has been reached. The maximum number available for this session is 4."  Please follow up with patient regarding appoitnment; did not see any available with other providers.  Thank you.   Symptoms  Reason For Call & Symptoms: Patient calling about diarrhea onset 11/26/12, sweating, nasal congestion and cough.  Estimated 6 episodes diarrhea per day; resolved with Imodium. Subjective fever with chills. Cough interferes with sleep.  Advised  See Today or Tomorrow in Office per Cough protocol due to Continuous coughing interferes with sleep per nursing judgment.  Reviewed Health History In EMR: Yes  Reviewed Medications In EMR: Yes  Reviewed Allergies In EMR: Yes  Reviewed Surgeries / Procedures: Yes  Date of Onset of Symptoms: 11/25/2012  Treatments Tried: Imodium  Treatments Tried Worked: Yes  Any Fever: Yes  Fever Taken: Tactile  Fever Time Of Reading: 12:26:28  Fever Last Reading: N/A  Guideline(s) Used:  Colds  Cough  Disposition Per Guideline:   See Today or Tomorrow in Office  Reason For Disposition Reached:   Patient wants to be seen  Advice Given:  N/A  Patient Will Follow Care Advice:  YES

## 2012-11-27 NOTE — Patient Instructions (Addendum)
Plain Mucinex (NOT D) for thick secretions ;force NON dairy fluids .   Nasal cleansing in the shower as discussed with lather of mild shampoo.After 10 seconds wash off lather while  exhaling through nostrils. Make sure that all residual soap is removed to prevent irritation.  Use a Neti pot daily only  as needed for significant sinus congestion; going from open side to congested side . Plain Allegra (NOT D )  160 daily , Loratidine 10 mg , OR Zyrtec 10 mg @ bedtime  as needed for itchy eyes & sneezing.   Stay on clear liquids for 48-72 hours or until bowels are normal.This would include  jello, sherbert (NOT ice cream), Lipton's chicken noodle soup(NOT cream based soups),Gatorade Lite, flat Ginger ale (without High Fructose Corn Syrup),dry toast or crackers, baked potato.No milk , dairy or grease until bowels are formed. Align , a [employer] , daily if stools are loose. Immodium AD for frankly watery stool. Report increasing pain, fever or rectal bleeding.  If you activate the  My Chart system; lab & Xray results will be released directly  to you as soon as I review & address these through the computer. If you choose not to sign up for My Chart within 36 hours of labs being drawn; results will be reviewed & interpretation added before being copied & mailed, causing a delay in getting the results to you.If you do not receive that report within 7-10 days ,please call. Additionally you can use this system to gain direct  access to your records  if  out of town or @ an office of a  physician who is not in  the My Chart network.  This improves continuity of care & places you in control of your medical record.

## 2012-11-27 NOTE — Telephone Encounter (Signed)
Stay on clear liquids for 48-72 hours or until bowels are normal.This would include  jello, sherbert (NOT ice cream), Lipton's chicken noodle soup(NOT cream based soups),Gatorade Lite, flat Ginger ale (without High Fructose Corn Syrup),dry toast or crackers, baked potato.No milk , dairy or grease until bowels are formed. Align , a [employer] , daily if stools are loose. Immodium AD for frankly watery stool. Report increasing pain, fever or rectal bleeding

## 2012-11-27 NOTE — Progress Notes (Signed)
  Subjective:    Patient ID: Martin Archer, male    DOB: [birth year], 52 y.o.   MRN: [id number]  HPI  Symptoms began on 11/26/12 with dry cough. Pt reports it has progressed to sore throat, headache, facial pain, clear nasal purulence and fatigue. Diarrhea began on 11/26/12 with 6 stools relieved with one dose of Immodium AD.  Clear liquid stools began again this morning and patient has taken one dose of immodium today with partial response.  He has had chills & sweats.   He has not received flu shot.  Does not smoke ; no PMH of perennial allergies.  Past medical history of bleeding ulcer and diverticulosis   Review of Systems Denies dental pain, earache & otic discharge. Denies wheezing, & asthma. Denies fever & itchy, watery eyes.      Objective:   Physical Exam General appearance: In no distress;adequately nourished; no acute distress or increased work of breathing is present.  No  lymphadenopathy about the head, neck, or axilla noted.   Eyes: No conjunctival inflammation or lid edema is present. There is no scleral icterus.  Ears:  External ear exam shows no significant lesions or deformities.  Otoscopic examination reveals clear canals, tympanic membranes are intact bilaterally without bulging, retraction, inflammation or discharge.  Nose:  External nasal examination shows no deformity or inflammation. Nasal mucosa are pink and moist without lesions or exudates. No septal dislocation or deviation.No obstruction to airflow.   Oral exam: Dental hygiene is good; lips and gums are healthy appearing.There is no oropharyngeal erythema or exudate noted. Tongue moist  Neck:  No deformities,  masses, or tenderness noted.    Heart:  Slightly fast rate and regular rhythm. S1 and S2 normal without gallop, murmur, click, rub or other extra sounds. S4  Lungs:Chest clear to auscultation; no wheezes, rhonchi,rales ,or rubs present.No increased work of breathing.    Abdomen: Bowel sounds  hyperactive. No masses, organomegaly, or tenderness   Extremities:  No cyanosis, edema, or clubbing  noted    Skin: Warm but slightly diaphoretic w/o jaundice or tenting.        Assessment & Plan:  #1 gastroenteritis, most likely viral with secondary upper respiratory tract symptoms. Clinically C. diff is not suggested although he did take antibiotics within the last 90 days. Clinically no evidence of acute diverticulitis or active ulcer.  Plan: Aggressive nasal hygiene recommended to prevent secondary rhinosinusitis. Symptomatic therapy as delineated. See lab orders

## 2012-11-28 ENCOUNTER — Telehealth: Payer: Self-pay

## 2012-11-28 LAB — CBC WITH DIFFERENTIAL/PLATELET
Eosinophils Relative: 0.3 % (ref 0.0–5.0)
HCT: 46.2 % (ref 39.0–52.0)
Hemoglobin: 16 g/dL (ref 13.0–17.0)
Lymphs Abs: 1.8 10*3/uL (ref 0.7–4.0)
Monocytes Relative: 9 % (ref 3.0–12.0)
Neutro Abs: 9.8 10*3/uL — ABNORMAL HIGH (ref 1.4–7.7)
Platelets: 277 10*3/uL (ref 150.0–400.0)
RBC: 4.86 Mil/uL (ref 4.22–5.81)
WBC: 12.7 10*3/uL — ABNORMAL HIGH (ref 4.5–10.5)

## 2012-11-28 LAB — BASIC METABOLIC PANEL
CO2: 27 mEq/L (ref 19–32)
Calcium: 9.9 mg/dL (ref 8.4–10.5)
Glucose, Bld: 100 mg/dL — ABNORMAL HIGH (ref 70–99)
Sodium: 135 mEq/L (ref 135–145)

## 2012-11-28 NOTE — Telephone Encounter (Signed)
Message copied by Logan Bores on Fri Nov 28, 2012  4:18 PM ------      Message from: Hendricks Limes      Created: Fri Nov 28, 2012  3:05 PM       White blood count is mildly elevated; please report any progression of symptoms such as fever, diarrhea or purulent secretions from the head or chest. A nonfasting because of 100 is not significant. All other values are normal. ------

## 2012-11-28 NOTE — Telephone Encounter (Signed)
Patient called back and understands voicemail message left for him about labs.  He has no questions or concerns.

## 2012-11-28 NOTE — Telephone Encounter (Signed)
Noted  

## 2012-11-28 NOTE — Telephone Encounter (Signed)
Left message on voicemail with Dr.Hopper's response to lab values. Patient to call if questions or concerns. Copy of report to be mailed to patient

## 2013-06-16 ENCOUNTER — Encounter (HOSPITAL_COMMUNITY): Payer: Self-pay | Admitting: Emergency Medicine

## 2013-06-16 DIAGNOSIS — K209 Esophagitis, unspecified without bleeding: Secondary | ICD-10-CM | POA: Diagnosis present

## 2013-06-16 DIAGNOSIS — D62 Acute posthemorrhagic anemia: Secondary | ICD-10-CM | POA: Diagnosis present

## 2013-06-16 DIAGNOSIS — Z7982 Long term (current) use of aspirin: Secondary | ICD-10-CM

## 2013-06-16 DIAGNOSIS — Z79899 Other long term (current) drug therapy: Secondary | ICD-10-CM

## 2013-06-16 DIAGNOSIS — R55 Syncope and collapse: Secondary | ICD-10-CM | POA: Diagnosis present

## 2013-06-16 DIAGNOSIS — M545 Low back pain, unspecified: Secondary | ICD-10-CM | POA: Diagnosis present

## 2013-06-16 DIAGNOSIS — E785 Hyperlipidemia, unspecified: Secondary | ICD-10-CM | POA: Diagnosis present

## 2013-06-16 DIAGNOSIS — Z8249 Family history of ischemic heart disease and other diseases of the circulatory system: Secondary | ICD-10-CM

## 2013-06-16 DIAGNOSIS — G8929 Other chronic pain: Secondary | ICD-10-CM | POA: Diagnosis present

## 2013-06-16 DIAGNOSIS — Z860100 Personal history of colon polyps, unspecified: Secondary | ICD-10-CM

## 2013-06-16 DIAGNOSIS — Z87891 Personal history of nicotine dependence: Secondary | ICD-10-CM

## 2013-06-16 DIAGNOSIS — K250 Acute gastric ulcer with hemorrhage: Principal | ICD-10-CM | POA: Diagnosis present

## 2013-06-16 DIAGNOSIS — K311 Adult hypertrophic pyloric stenosis: Secondary | ICD-10-CM | POA: Diagnosis present

## 2013-06-16 DIAGNOSIS — I10 Essential (primary) hypertension: Secondary | ICD-10-CM | POA: Diagnosis present

## 2013-06-16 DIAGNOSIS — M129 Arthropathy, unspecified: Secondary | ICD-10-CM | POA: Diagnosis present

## 2013-06-16 NOTE — ED Notes (Addendum)
[facility] with recent history of URI, today while sitting at meeting at 8 pm became diaphoretic, dizzy, nauseated and almost passed out, took BP and reports hypotension at the time. This AM episode of dark black hard stools positive for occult blood at [facility]. Denies pain at his time, denies abdominal cramping. Denies diarhhea, dneis dizziness.

## 2013-06-17 ENCOUNTER — Encounter (HOSPITAL_COMMUNITY)
Admission: EM | Disposition: A | Discharge status: Home / Self Care | Payer: Self-pay | Source: Home / Self Care | Attending: Internal Medicine

## 2013-06-17 ENCOUNTER — Encounter (HOSPITAL_COMMUNITY): Payer: Self-pay | Admitting: Internal Medicine

## 2013-06-17 ENCOUNTER — Inpatient Hospital Stay (HOSPITAL_COMMUNITY)
Admission: EM | Admit: 2013-06-17 | Discharge: 2013-06-18 | DRG: 378 | Disposition: A | Discharge status: Home / Self Care | Payer: BC Managed Care – PPO | Attending: Internal Medicine | Admitting: Internal Medicine

## 2013-06-17 DIAGNOSIS — R0989 Other specified symptoms and signs involving the circulatory and respiratory systems: Secondary | ICD-10-CM

## 2013-06-17 DIAGNOSIS — E785 Hyperlipidemia, unspecified: Secondary | ICD-10-CM

## 2013-06-17 DIAGNOSIS — Z860100 Personal history of colon polyps, unspecified: Secondary | ICD-10-CM

## 2013-06-17 DIAGNOSIS — R55 Syncope and collapse: Secondary | ICD-10-CM

## 2013-06-17 DIAGNOSIS — M549 Dorsalgia, unspecified: Secondary | ICD-10-CM

## 2013-06-17 DIAGNOSIS — K250 Acute gastric ulcer with hemorrhage: Principal | ICD-10-CM

## 2013-06-17 DIAGNOSIS — K922 Gastrointestinal hemorrhage, unspecified: Secondary | ICD-10-CM

## 2013-06-17 DIAGNOSIS — H906 Mixed conductive and sensorineural hearing loss, bilateral: Secondary | ICD-10-CM

## 2013-06-17 DIAGNOSIS — G8929 Other chronic pain: Secondary | ICD-10-CM

## 2013-06-17 DIAGNOSIS — G4730 Sleep apnea, unspecified: Secondary | ICD-10-CM

## 2013-06-17 DIAGNOSIS — E782 Mixed hyperlipidemia: Secondary | ICD-10-CM | POA: Diagnosis present

## 2013-06-17 DIAGNOSIS — I10 Essential (primary) hypertension: Secondary | ICD-10-CM

## 2013-06-17 DIAGNOSIS — K5730 Diverticulosis of large intestine without perforation or abscess without bleeding: Secondary | ICD-10-CM

## 2013-06-17 DIAGNOSIS — F17200 Nicotine dependence, unspecified, uncomplicated: Secondary | ICD-10-CM | POA: Diagnosis present

## 2013-06-17 DIAGNOSIS — D649 Anemia, unspecified: Secondary | ICD-10-CM

## 2013-06-17 HISTORY — PX: ESOPHAGOGASTRODUODENOSCOPY: SHX5428

## 2013-06-17 LAB — POCT I-STAT TROPONIN I

## 2013-06-17 LAB — CBC
HCT: 28.6 % — ABNORMAL LOW (ref 39.0–52.0)
HCT: 26.9 % — ABNORMAL LOW (ref 39.0–52.0)
Hemoglobin: 9.7 g/dL — ABNORMAL LOW (ref 13.0–17.0)
Hemoglobin: 9 g/dL — ABNORMAL LOW (ref 13.0–17.0)
MCH: 32.8 pg (ref 26.0–34.0)
MCH: 32.8 pg (ref 26.0–34.0)
MCHC: 33.9 g/dL (ref 30.0–36.0)
MCHC: 33.9 g/dL (ref 30.0–36.0)
MCHC: 33.5 g/dL (ref 30.0–36.0)
MCV: 96.6 fL (ref 78.0–100.0)
MCV: 96.5 fL (ref 78.0–100.0)
Platelets: 196 10*3/uL (ref 150–400)
Platelets: 199 10*3/uL (ref 150–400)
RBC: 2.87 MIL/uL — ABNORMAL LOW (ref 4.22–5.81)
RDW: 12.6 % (ref 11.5–15.5)
RDW: 12.6 % (ref 11.5–15.5)
RDW: 12.6 % (ref 11.5–15.5)
WBC: 9.5 10*3/uL (ref 4.0–10.5)
WBC: 10.6 10*3/uL — ABNORMAL HIGH (ref 4.0–10.5)

## 2013-06-17 LAB — CBC WITH DIFFERENTIAL/PLATELET
Eosinophils Absolute: 0.1 10*3/uL (ref 0.0–0.7)
Eosinophils Relative: 1 % (ref 0–5)
HCT: 34.3 % — ABNORMAL LOW (ref 39.0–52.0)
Hemoglobin: 11.7 g/dL — ABNORMAL LOW (ref 13.0–17.0)
Lymphs Abs: 3 10*3/uL (ref 0.7–4.0)
MCH: 33.4 pg (ref 26.0–34.0)
MCV: 98 fL (ref 78.0–100.0)
Monocytes Absolute: 1.5 10*3/uL — ABNORMAL HIGH (ref 0.1–1.0)
Monocytes Relative: 10 % (ref 3–12)
Neutro Abs: 10 10*3/uL — ABNORMAL HIGH (ref 1.7–7.7)
Platelets: 270 10*3/uL (ref 150–400)
RBC: 3.5 MIL/uL — ABNORMAL LOW (ref 4.22–5.81)
RDW: 12.6 % (ref 11.5–15.5)
WBC: 14.6 10*3/uL — ABNORMAL HIGH (ref 4.0–10.5)

## 2013-06-17 LAB — MRSA PCR SCREENING: MRSA by PCR: NEGATIVE

## 2013-06-17 LAB — HEPATIC FUNCTION PANEL
ALT: 22 U/L (ref 0–53)
AST: 26 U/L (ref 0–37)
Alkaline Phosphatase: 85 U/L (ref 39–117)
Bilirubin, Direct: 0.1 mg/dL (ref 0.0–0.3)
Total Bilirubin: 0.5 mg/dL (ref 0.3–1.2)

## 2013-06-17 LAB — TYPE AND SCREEN: ABO/RH(D): O POS

## 2013-06-17 LAB — BASIC METABOLIC PANEL
BUN: 64 mg/dL — ABNORMAL HIGH (ref 6–23)
Calcium: 9.4 mg/dL (ref 8.4–10.5)
Creatinine, Ser: 1.17 mg/dL (ref 0.50–1.35)
GFR calc non Af Amer: 69 mL/min — ABNORMAL LOW (ref >90)
Glucose, Bld: 109 mg/dL — ABNORMAL HIGH (ref 70–99)
Sodium: 137 mEq/L (ref 135–145)

## 2013-06-17 SURGERY — EGD (ESOPHAGOGASTRODUODENOSCOPY)
Anesthesia: Moderate Sedation | Laterality: Left

## 2013-06-17 MED ORDER — BUTAMBEN-TETRACAINE-BENZOCAINE 2-2-14 % EX AERO
INHALATION_SPRAY | CUTANEOUS | Status: DC | PRN
  Administered 2013-06-17: 2 via TOPICAL

## 2013-06-17 MED ORDER — MIDAZOLAM HCL 5 MG/ML IJ SOLN
INTRAMUSCULAR | Status: AC
  Filled 2013-06-17: qty 2

## 2013-06-17 MED ORDER — SODIUM CHLORIDE 0.9 % IV BOLUS (SEPSIS)
1000.0000 mL | Freq: Once | INTRAVENOUS | Status: AC
  Administered 2013-06-17: 1000 mL via INTRAVENOUS

## 2013-06-17 MED ORDER — PANTOPRAZOLE SODIUM 40 MG IV SOLR: 80.0000 mg | Freq: Once | INTRAVENOUS | Status: DC

## 2013-06-17 MED ORDER — FENTANYL CITRATE 0.05 MG/ML IJ SOLN
INTRAMUSCULAR | Status: DC | PRN
  Administered 2013-06-17: 25 ug via INTRAVENOUS

## 2013-06-17 MED ORDER — SODIUM CHLORIDE 0.9 % IV SOLN: 80.0000 mg | Freq: Once | INTRAVENOUS | Status: DC

## 2013-06-17 MED ORDER — SODIUM CHLORIDE 0.9 % IV SOLN
8.0000 mg/h | INTRAVENOUS | Status: DC
  Administered 2013-06-17: 8 mg/h via INTRAVENOUS
  Filled 2013-06-17 (×2): qty 80

## 2013-06-17 MED ORDER — SODIUM CHLORIDE 0.9 % IV SOLN
INTRAVENOUS | Status: DC
  Administered 2013-06-17: 1000 mL via INTRAVENOUS
  Administered 2013-06-18: 12:00:00 via INTRAVENOUS

## 2013-06-17 MED ORDER — METOPROLOL SUCCINATE ER 100 MG PO TB24
100.0000 mg | ORAL_TABLET | Freq: Every day | ORAL | Status: DC
  Administered 2013-06-17: 100 mg via ORAL
  Filled 2013-06-17 (×2): qty 1

## 2013-06-17 MED ORDER — PANTOPRAZOLE SODIUM 40 MG IV SOLR
40.0000 mg | Freq: Two times a day (BID) | INTRAVENOUS | Status: AC
  Administered 2013-06-17 – 2013-06-18 (×2): 40 mg via INTRAVENOUS
  Filled 2013-06-17 (×2): qty 40

## 2013-06-17 MED ORDER — FENTANYL CITRATE 0.05 MG/ML IJ SOLN
INTRAMUSCULAR | Status: AC
  Filled 2013-06-17: qty 2

## 2013-06-17 MED ORDER — DIAZEPAM 5 MG/ML IJ SOLN: 10.0000 mg | Freq: Once | INTRAMUSCULAR | Status: DC | PRN

## 2013-06-17 MED ORDER — PANTOPRAZOLE SODIUM 40 MG PO TBEC
40.0000 mg | DELAYED_RELEASE_TABLET | Freq: Two times a day (BID) | ORAL | Status: DC
  Administered 2013-06-18: 40 mg via ORAL
  Filled 2013-06-17: qty 1

## 2013-06-17 MED ORDER — MIDAZOLAM HCL 10 MG/2ML IJ SOLN
INTRAMUSCULAR | Status: DC | PRN
  Administered 2013-06-17 (×2): 1 mg via INTRAVENOUS
  Administered 2013-06-17: 2 mg via INTRAVENOUS

## 2013-06-17 MED ORDER — ONDANSETRON HCL 4 MG PO TABS: 4.0000 mg | ORAL_TABLET | Freq: Four times a day (QID) | ORAL | Status: DC | PRN

## 2013-06-17 MED ORDER — GABAPENTIN 100 MG PO CAPS
100.0000 mg | ORAL_CAPSULE | Freq: Three times a day (TID) | ORAL | Status: DC
  Administered 2013-06-17 – 2013-06-18 (×4): 100 mg via ORAL
  Filled 2013-06-17 (×6): qty 1

## 2013-06-17 MED ORDER — FLUOXETINE HCL 20 MG PO CAPS
20.0000 mg | ORAL_CAPSULE | Freq: Every day | ORAL | Status: DC
  Administered 2013-06-17 – 2013-06-18 (×2): 20 mg via ORAL
  Filled 2013-06-17 (×2): qty 1

## 2013-06-17 MED ORDER — PANTOPRAZOLE SODIUM 40 MG IV SOLR: 40.0000 mg | Freq: Two times a day (BID) | INTRAVENOUS | Status: DC

## 2013-06-17 MED ORDER — DIPHENHYDRAMINE HCL 50 MG/ML IJ SOLN
INTRAMUSCULAR | Status: AC
  Filled 2013-06-17: qty 1

## 2013-06-17 MED ORDER — SODIUM CHLORIDE 0.9 % IV SOLN
80.0000 mg | INTRAVENOUS | Status: AC
  Administered 2013-06-17: 80 mg via INTRAVENOUS
  Filled 2013-06-17: qty 80

## 2013-06-17 MED ORDER — DEXTROSE-NACL 5-0.9 % IV SOLN
INTRAVENOUS | Status: DC
  Administered 2013-06-17: 07:00:00 via INTRAVENOUS

## 2013-06-17 MED ORDER — ONDANSETRON HCL 4 MG/2ML IJ SOLN: 4.0000 mg | Freq: Four times a day (QID) | INTRAMUSCULAR | Status: DC | PRN

## 2013-06-17 MED ORDER — NICOTINE 14 MG/24HR TD PT24
14.0000 mg | MEDICATED_PATCH | TRANSDERMAL | Status: DC
  Administered 2013-06-17: 14 mg via TRANSDERMAL
  Filled 2013-06-17 (×2): qty 1

## 2013-06-17 MED ORDER — SODIUM CHLORIDE 0.9 % IJ SOLN
3.0000 mL | Freq: Two times a day (BID) | INTRAMUSCULAR | Status: DC
  Administered 2013-06-17 (×2): 3 mL via INTRAVENOUS

## 2013-06-17 NOTE — Progress Notes (Signed)
[facility] Progress Note [place] TEAM 1 - Stepdown/ICU TEAM   Martin Archer [id number] DOB: 1960/09/19 DOA: 06/17/2013 PCP: Unice Cobble, MD  Admit HPI / Brief Narrative: 52 y.o. male with hx of bleeding gastric ulcer in 2011 (Dr Sharlett Iles of [facility]), Hx of chronic back pain on chronic NSAIDS, hx of HTN, ETOH, chronic anemia, hyperlipidemia, s/p Left carotid endarterectomy, not on iron supplement or taking Pepto Bismol, presented to the ER with epigastric pain and melanotic stool for one day. He also had been feeling lightheaded and was presyncopal.  Evalatuion in the ER included a Hb of 11.7 g/DL, with stable hemodynamics. His BUN was elevated to 64 with normal Cr. In the ER, he was guaic positive with black stool. [facility] was asked to admit him for UGIB, after several instances where he was going to sign out AMA.   Assessment/Plan:  UGIB - prior hx of gastric ulcer  Symptomatic Acute blood loss anemia   Sleep apnea  HTN  HLD   PVD s/p CEA 2008  Chronic low back pain  Code Status: FULL Family Communication: no family present at time of exam Disposition Plan: SDU  Consultants: GI  Procedures: EGD 10/22 - Large clean-based antral ulcer with mild pyloric stenosis. No visible vessel, adherent clot or active bleeding seen.  Antibiotics: none  DVT prophylaxis: SCDs  HPI/Subjective: Pt seen for f/u visit.  Objective: Blood pressure 101/80, pulse 80, temperature 98.3 F (36.8 C), temperature source Oral, resp. rate 14, height 5\' 8"  (1.727 m), weight 86.5 kg (190 lb 11.2 oz), SpO2 96.00%.  Intake/Output Summary (Last 24 hours) at 06/17/13 1455 Last data filed at 06/17/13 1351  Gross per 24 hour  Intake 394.67 ml  Output      0 ml  Net 394.67 ml   Exam: F/U exam completed  Data Reviewed: Basic Metabolic Panel:  Recent Labs Lab 06/16/13 2351  NA 137  K 4.8  CL 96  CO2 32  GLUCOSE 109*  BUN 64*  CREATININE 1.17   CALCIUM 9.4   Liver Function Tests:  Recent Labs Lab 06/17/13 0415  AST 26  ALT 22  ALKPHOS 85  BILITOT 0.5  PROT 7.3  ALBUMIN 3.9    Recent Labs Lab 06/17/13 0415  LIPASE 41   CBC:  Recent Labs Lab 06/16/13 2351 06/17/13 0720 06/17/13 1022  WBC 14.6* 10.8* 9.5  NEUTROABS 10.0*  --   --   HGB 11.7* 9.7* 9.4*  HCT 34.3* 28.6* 27.7*  MCV 98.0 96.6 96.5  PLT 270 200 196    Recent Results (from the past 240 hour(s))  MRSA PCR SCREENING     Status: None   Collection Time    06/17/13  6:25 AM      Result Value Range Status   MRSA by PCR NEGATIVE  NEGATIVE Final   Comment:            The GeneXpert MRSA Assay (FDA     approved for NASAL specimens     only), is one component of a     comprehensive MRSA colonization     surveillance program. It is not     intended to diagnose MRSA     infection nor to guide or     monitor treatment for     MRSA infections.     Studies:  Recent x-ray studies have been reviewed in detail by the Attending Physician  Scheduled Meds:  Scheduled Meds: . FLUoxetine  20 mg  Oral Daily  . gabapentin  100 mg Oral TID  . metoprolol succinate  100 mg Oral Daily  . [START ON 06/18/2013] pantoprazole  40 mg Oral BID  . pantoprazole (PROTONIX) IV  40 mg Intravenous Q12H  . sodium chloride  3 mL Intravenous Q12H    Time spent on care of this patient: 25+ mins   [place]  [phone] Pager - Text Page per Shea Evans as per below:  On-Call/Text Page:      [email or web address]      password [email or web address]  If 7PM-7AM, please contact night-coverage [email or web address] Password [email or web address] 06/17/2013, 2:55 PM   LOS: 0 days

## 2013-06-17 NOTE — H&P (Signed)
[facility] History and Physical  Martin Archer [id number] DOB: Jul 04, 1961    PCP:   Unice Cobble, MD   Chief Complaint: Black stool and epigastric burning pain.  HPI: Martin Archer is an 52 y.o. male with hx of bleeding gastric ulcer in 2011 (Dr Sharlett Iles of [facility]), Hx of chronic back pain on chronic NSAIDS, though he hadn't taken it for quite a while, hx of HTN, ETOH, chronic anemia, hyperlipidemia, s/p Left carotid endarterectomy, not on iron supplement or taking Pepto Bismol, presents to the ER with epigastric pain and melanotic stool for one day.  He also has been feeling lightheaded and was presyncopal.  Evalatuion in the ER included a Hb of 11.7 g/DL, with stable hemodynamics. His BUN was elevated to 64 with normal Cr.  In the ER, he was guaic positive with black stool.  Hospitalist was asked to admit him for UGIB, after several instances where he was going to sign out AMA.    Rewiew of Systems:  Constitutional: Negative for malaise, fever and chills. No significant weight loss or weight gain Eyes: Negative for eye pain, redness and discharge, diplopia, visual changes, or flashes of light. ENMT: Negative for ear pain, hoarseness, nasal congestion, sinus pressure and sore throat. No headaches; tinnitus, drooling, or problem swallowing. Cardiovascular: Negative for chest pain, palpitations, diaphoresis, dyspnea and peripheral edema. ; No orthopnea, PND Respiratory: Negative for cough, hemoptysis, wheezing and stridor. No pleuritic chestpain. Gastrointestinal: Negative for nausea, vomiting, diarrhea, constipation,  blood in stool, hematemesis, jaundice and rectal bleeding.    Genitourinary: Negative for frequency, dysuria, incontinence,flank pain and hematuria; Musculoskeletal: Negative for back pain and neck pain. Negative for swelling and trauma.;  Skin: . Negative for pruritus, rash, abrasions, bruising and skin lesion.; ulcerations Neuro: Negative for  headache, lightheadedness and neck stiffness. Negative for weakness, altered level of consciousness , altered mental status, extremity weakness, burning feet, involuntary movement, seizure and syncope.  Psych: negative for anxiety, depression, insomnia, tearfulness, panic attacks, hallucinations, paranoia, suicidal or homicidal ideation.   Past Medical History  Diagnosis Date  . Gastric ulcer with hemorrhage and obstruction 2011    Dr [facility]  . Anemia, unspecified   . Personal history of colonic polyps 01/30/2010    hyperplastic rectal  . Diverticulosis of colon (without mention of hemorrhage)   . Sleep apnea     on CPAP; [facility]  . Hypertension   . Hyperlipemia   . Arthritis     Past Surgical History  Procedure Laterality Date  . Inguinal hernia repair  1963  . Wisdom tooth extraction    . Nasal fracture surgery    . Carotid endarterectomy  11/2006    left Dr Victorino Dike    Medications:  HOME MEDS: Prior to Admission medications   Medication Sig Start Date End Date Taking? Authorizing Provider  aspirin EC 81 MG tablet Take 81 mg by mouth daily.   Yes Historical Provider, MD  diazepam (VALIUM) 5 MG/ML injection Inject 10 mg into the vein once as needed (as needed for withdrawal seizure).   Yes Historical Provider, MD  furosemide (LASIX) 40 MG tablet Take 40 mg by mouth daily.   Yes Historical Provider, MD  gabapentin (NEURONTIN) 100 MG capsule Take 100 mg by mouth 3 (three) times daily.   Yes Historical Provider, MD  ibuprofen (ADVIL,MOTRIN) 600 MG tablet Take 600 mg by mouth every 6 (six) hours as needed for pain or fever.   Yes Historical Provider, MD  ketorolac (TORADOL) 10 MG tablet Take 10 mg by mouth every 8 (eight) hours as needed for pain.   Yes Historical Provider, MD  lisinopril (PRINIVIL,ZESTRIL) 20 MG tablet Take 20 mg by mouth daily.   Yes Historical Provider, MD  loperamide (IMODIUM A-D) 2 MG tablet Take 2 mg by mouth 4 (four) times daily  as needed for diarrhea or loose stools.   Yes Historical Provider, MD  metoprolol succinate (TOPROL-XL) 100 MG 24 hr tablet Take 100 mg by mouth daily. Take with or immediately following a meal.   Yes Historical Provider, MD  Multiple Vitamins-Minerals (MULTIVITAL PO) Take 1 tablet by mouth daily. Adult multivitamin with folic acid   Yes Historical Provider, MD  Omega-3 Fatty Acids (FISH OIL PO) Take 3 g by mouth daily.   Yes Historical Provider, MD  ondansetron (ZOFRAN) 8 MG tablet Take 8 mg by mouth every 12 (twelve) hours as needed for nausea.   Yes Historical Provider, MD  OVER THE COUNTER MEDICATION Take 1 lozenge by mouth every 4 (four) hours as needed (Benzocaine sore throat lozenges).   Yes Historical Provider, MD  thiamine (VITAMIN B-1) 100 MG tablet Take 100 mg by mouth daily.   Yes Historical Provider, MD  esomeprazole (NEXIUM) 40 MG capsule Take 1 capsule (40 mg total) by mouth daily. 03/27/11 08/27/97  Sable Feil, MD  FLUoxetine (PROZAC) 20 MG capsule Take 20 mg by mouth daily.    Historical Provider, MD     Allergies:  Allergies  Allergen Reactions  . Sertraline Hcl     REACTION: diarrhea    Social History:   reports that he quit smoking about 6 years ago. He has never used smokeless tobacco. He reports that he drinks alcohol. He reports that he does not use illicit drugs.  Family History: Family History  Problem Relation Age of Onset  . Diabetes Father   . Hypertension Father   . Stroke Father     in late 84s  . Heart attack Mother     in 90s  . Heart attack Brother 65  . Diabetes Paternal Grandfather   . Cancer Maternal Grandfather     unknown type  . Heart attack Maternal Grandfather 76  . Colon cancer Neg Hx   . Stroke Paternal Uncle     in late 67s     Physical Exam: Filed Vitals:   06/16/13 2347 06/17/13 0153 06/17/13 0513  BP: 121/77 133/78 98/68  Pulse: 87 79 75  Temp: 98.7 F (37.1 C)    TempSrc: Oral    Resp: 15 16   Weight: 86.297 kg  (190 lb 4 oz)    SpO2: 97% 98% 96%   Blood pressure 98/68, pulse 75, temperature 98.7 F (37.1 C), temperature source Oral, resp. rate 16, weight 86.297 kg (190 lb 4 oz), SpO2 96.00%.  GEN:  Pleasant patient lying in the stretcher in no acute distress; cooperative with exam. PSYCH:  alert and oriented x4; does not appear anxious or depressed; affect is appropriate. HEENT: Mucous membranes pink and anicteric; PERRLA; EOM intact; no cervical lymphadenopathy nor thyromegaly or carotid bruit; no JVD; There were no stridor. Neck is very supple. Breasts:: Not examined CHEST WALL: No tenderness CHEST: Normal respiration, clear to auscultation bilaterally.  HEART: Regular rate and rhythm.  There are no murmur, rub, or gallops.   BACK: No kyphosis or scoliosis; no CVA tenderness ABDOMEN: soft and non-tender; no masses, no organomegaly, normal abdominal bowel sounds; no pannus; no intertriginous candida.  There is no rebound and no distention. Rectal Exam: Not done EXTREMITIES: No bone or joint deformity; age-appropriate arthropathy of the hands and knees; no edema; no ulcerations.  There is no calf tenderness. Genitalia: not examined PULSES: 2+ and symmetric SKIN: Normal hydration no rash or ulceration CNS: Cranial nerves 2-12 grossly intact no focal lateralizing neurologic deficit.  Speech is fluent; uvula elevated with phonation, facial symmetry and tongue midline. DTR are normal bilaterally, cerebella exam is intact, barbinski is negative and strengths are equaled bilaterally.  No sensory loss.   Labs on Admission:  Basic Metabolic Panel:  Recent Labs Lab 06/16/13 2351  NA 137  K 4.8  CL 96  CO2 32  GLUCOSE 109*  BUN 64*  CREATININE 1.17  CALCIUM 9.4   Liver Function Tests:  Recent Labs Lab 06/17/13 0415  AST 26  ALT 22  ALKPHOS 85  BILITOT 0.5  PROT 7.3  ALBUMIN 3.9    Recent Labs Lab 06/17/13 0415  LIPASE 41   No results found for this basename: AMMONIA,  in the last  168 hours CBC:  Recent Labs Lab 06/16/13 2351  WBC 14.6*  NEUTROABS 10.0*  HGB 11.7*  HCT 34.3*  MCV 98.0  PLT 270   Cardiac Enzymes: No results found for this basename: CKTOTAL, CKMB, CKMBINDEX, TROPONINI,  in the last 168 hours  CBG: No results found for this basename: GLUCAP,  in the last 168 hours   Radiological Exams on Admission: No results found.  EKG:   NSR without acute ischemic changes.  Assessment/Plan Present on Admission:  . Upper GI bleed . HYPERTENSION . HYPERLIPIDEMIA . SLEEP APNEA . Back pain, chronic  PLAN:  Will admit him to SDU for upper GI Bleed.  Will type and screen, but hold off on transfusion.  Will follow H/H every 6 hours.  Start IV Protonix and drip.  Hold Lasix, Lisinopril, but will continue Betablocker.  GI has been consulted as per EDP, and will make him NPO for upper endoscopy.  His other medicines will be continued.  Will hold ASA, and all NSAIDS.  He is stable, full code, and will be admitted to [facility] service.  Thank you for allowing me to participate in his care.  Other plans as per orders.  Code Status: FULL Haskel Khan, MD. [facility] Pager [phone] 7pm to 7am.  06/17/2013, 5:17 AM

## 2013-06-17 NOTE — ED Notes (Signed)
Attempted to call report to floor RN; RN unavailable will call back.

## 2013-06-17 NOTE — Progress Notes (Signed)
Utilization Review Completed.  

## 2013-06-17 NOTE — ED Notes (Signed)
Pt advised he will not allow any nursing care or interventions until he speaks with MD. Pt states he has a lot of questions for Md. Md made aware.

## 2013-06-17 NOTE — ED Provider Notes (Signed)
CSN: [id number]     Arrival date & time 06/16/13  2330 History   First MD Initiated Contact with Patient 06/17/13 0206     Chief Complaint  Patient presents with  . Near Syncope  . Melena   (Consider location/radiation/quality/duration/timing/severity/associated sxs/prior Treatment) HPI Patient with a history of peptic ulcer disease and chronic alcoholism presents with near syncopal episode this evening. Patient states he has not drank any alcohol recently but has used ibuprofen intermittently. He noticed black stool this morning and again this evening. States he was at a meeting and became lightheaded, nauseated and his vision started to tunnel. He also complains of mild epigastric pain. He has no focal weakness or numbness. States he did not actually lose consciousness.  Past Medical History  Diagnosis Date  . Gastric ulcer with hemorrhage and obstruction 2011    [facility]  . Anemia, unspecified   . Personal history of colonic polyps 01/30/2010    hyperplastic rectal  . Diverticulosis of colon (without mention of hemorrhage)   . Sleep apnea     on CPAP; [facility]  . Hypertension   . Hyperlipemia   . Arthritis    Past Surgical History  Procedure Laterality Date  . Inguinal hernia repair  1963  . Wisdom tooth extraction    . Nasal fracture surgery    . Carotid endarterectomy  11/2006    left Dr Victorino Dike   Family History  Problem Relation Age of Onset  . Diabetes Father   . Hypertension Father   . Stroke Father     in late 24s  . Heart attack Mother     in 42s  . Heart attack Brother 80  . Diabetes Paternal Grandfather   . Cancer Maternal Grandfather     unknown type  . Heart attack Maternal Grandfather 76  . Colon cancer Neg Hx   . Stroke Paternal Uncle     in late 57s   History  Substance Use Topics  . Smoking status: Former Smoker    Quit date: 06/27/2006  . Smokeless tobacco: Never Used  . Alcohol Use: Yes     Comment: [facility] at  [facility].     Review of Systems  Constitutional: Positive for fatigue. Negative for fever and chills.  Respiratory: Negative for cough and shortness of breath.   Cardiovascular: Negative for chest pain.  Gastrointestinal: Positive for nausea, abdominal pain and blood in stool. Negative for vomiting, diarrhea and constipation.  Genitourinary: Negative for dysuria, frequency, hematuria and flank pain.  Musculoskeletal: Negative for back pain, gait problem, myalgias, neck pain and neck stiffness.  Skin: Negative for rash and wound.  Neurological: Positive for dizziness and light-headedness. Negative for weakness, numbness and headaches.  All other systems reviewed and are negative.    Allergies  Sertraline hcl  Home Medications   Current Outpatient Rx  Name  Route  Sig  Dispense  Refill  . aspirin EC 81 MG tablet   Oral   Take 81 mg by mouth daily.         . diazepam (VALIUM) 5 MG/ML injection   Intravenous   Inject 10 mg into the vein once as needed (as needed for withdrawal seizure).         . furosemide (LASIX) 40 MG tablet   Oral   Take 40 mg by mouth daily.         Marland Kitchen gabapentin (NEURONTIN) 100 MG capsule   Oral  Take 100 mg by mouth 3 (three) times daily.         Marland Kitchen ibuprofen (ADVIL,MOTRIN) 600 MG tablet   Oral   Take 600 mg by mouth every 6 (six) hours as needed for pain or fever.         Marland Kitchen ketorolac (TORADOL) 10 MG tablet   Oral   Take 10 mg by mouth every 8 (eight) hours as needed for pain.         Marland Kitchen lisinopril (PRINIVIL,ZESTRIL) 20 MG tablet   Oral   Take 20 mg by mouth daily.         Marland Kitchen loperamide (IMODIUM A-D) 2 MG tablet   Oral   Take 2 mg by mouth 4 (four) times daily as needed for diarrhea or loose stools.         . metoprolol succinate (TOPROL-XL) 100 MG 24 hr tablet   Oral   Take 100 mg by mouth daily. Take with or immediately following a meal.         . Multiple Vitamins-Minerals (MULTIVITAL PO)   Oral    Take 1 tablet by mouth daily. Adult multivitamin with folic acid         . Omega-3 Fatty Acids (FISH OIL PO)   Oral   Take 3 g by mouth daily.         . ondansetron (ZOFRAN) 8 MG tablet   Oral   Take 8 mg by mouth every 12 (twelve) hours as needed for nausea.         Marland Kitchen OVER THE COUNTER MEDICATION   Oral   Take 1 lozenge by mouth every 4 (four) hours as needed (Benzocaine sore throat lozenges).         . thiamine (VITAMIN B-1) 100 MG tablet   Oral   Take 100 mg by mouth daily.         Marland Kitchen esomeprazole (NEXIUM) 40 MG capsule   Oral   Take 1 capsule (40 mg total) by mouth daily.   30 capsule   6   . FLUoxetine (PROZAC) 20 MG capsule   Oral   Take 20 mg by mouth daily.          BP 133/78  Pulse 79  Temp(Src) 98.7 F (37.1 C) (Oral)  Resp 16  Wt 190 lb 4 oz (86.297 kg)  BMI 29.79 kg/m2  SpO2 98% Physical Exam  Nursing note and vitals reviewed. Constitutional: He is oriented to person, place, and time. He appears well-developed and well-nourished. No distress.  HENT:  Head: Normocephalic and atraumatic.  Mouth/Throat: Oropharynx is clear and moist.  Eyes: EOM are normal. Pupils are equal, round, and reactive to light.  Neck: Normal range of motion. Neck supple.  Cardiovascular: Normal rate and regular rhythm.   Pulmonary/Chest: Effort normal and breath sounds normal. No respiratory distress. He has no wheezes. He has no rales.  Abdominal: Soft. Bowel sounds are normal. He exhibits no distension and no mass. There is tenderness (tenderness to palpation in the left upper quadrant and epigastrium.). There is no rebound and no guarding.  Genitourinary: Guaiac positive stool.  Patient has melanotic stool is guaiac positive.  Musculoskeletal: Normal range of motion. He exhibits no edema and no tenderness.  Neurological: He is alert and oriented to person, place, and time.  Patient is alert and oriented x3 with clear, goal oriented speech. Patient has 5/5 motor in all  extremities. Sensation is intact to light touch.  Skin: Skin is warm  and dry. No rash noted. No erythema.  Psychiatric: He has a normal mood and affect. His behavior is normal.    ED Course  Procedures (including critical care time) Labs Review Labs Reviewed  CBC WITH DIFFERENTIAL - Abnormal; Notable for the following:    WBC 14.6 (*)    RBC 3.50 (*)    Hemoglobin 11.7 (*)    HCT 34.3 (*)    Neutro Abs 10.0 (*)    Monocytes Absolute 1.5 (*)    All other components within normal limits  BASIC METABOLIC PANEL - Abnormal; Notable for the following:    Glucose, Bld 109 (*)    BUN 64 (*)    GFR calc non Af Amer 69 (*)    GFR calc Af Amer 80 (*)    All other components within normal limits  OCCULT BLOOD, POC DEVICE - Abnormal; Notable for the following:    Fecal Occult Bld POSITIVE (*)    All other components within normal limits  HEPATIC FUNCTION PANEL  LIPASE, BLOOD  POCT I-STAT TROPONIN I  TYPE AND SCREEN   Imaging Review No results found.  EKG Interpretation     Ventricular Rate:  87 PR Interval:  122 QRS Duration: 92 QT Interval:  396 QTC Calculation: 476 R Axis:   61 Text Interpretation:  Normal sinus rhythm Nonspecific T wave abnormality Prolonged QT Abnormal ECG           CRITICAL CARE Performed by: Lita Mains, Bellami Farrelly Total critical care time: 30 min Critical care time was exclusive of separately billable procedures and treating other patients. Critical care was necessary to treat or prevent imminent or life-threatening deterioration. Critical care was time spent personally by me on the following activities: development of treatment plan with patient and/or surrogate as well as nursing, discussions with consultants, evaluation of patient's response to treatment, examination of patient, obtaining history from patient or surrogate, ordering and performing treatments and interventions, ordering and review of laboratory studies, ordering and review of radiographic  studies, pulse oximetry and re-evaluation of patient's condition.  I spent an extensive amount of time in the patient's room explaining the necessity for admission. I explained that his hemoglobin had dropped from previous levels. I explained that he may need a blood transfusion and may need to see a gastroenterologist during this hospitalization. I explained that if he were to leave [facility] and it could result in his death or disability. The patient has intermittently been refusing certain tests or medications. He is told the nursing staff several times that he wants to leave and then recants.   Discussed with Dr. Marin Comment. Will admit the patient to step down unit. I also spoke with Dr. Amedeo Plenty of gastroenterology. He will see the patient this morning. Patient's vital signs remained stable in the emergency department.  Julianne Rice, MD 06/17/13 [phone]

## 2013-06-17 NOTE — Op Note (Signed)
[facility] [place], [id number]   ENDOSCOPY PROCEDURE REPORT  PATIENT: Martin Archer, Martin Archer.  MR#: [id number] BIRTHDATE: March 26, 1961 , 52  yrs. old GENDER: Male ENDOSCOPIST: Arta Silence, MD REFERRED BY:  [facility] PROCEDURE DATE:  06/17/2013 PROCEDURE:  EGD, diagnostic ASA CLASS:     Class II INDICATIONS:  melena, anemia. MEDICATIONS: Fentanyl 25 mcg IV and Versed 4 mg IV TOPICAL ANESTHETIC: Cetacaine Spray  DESCRIPTION OF PROCEDURE: After the risks benefits and alternatives of the procedure were thoroughly explained, informed consent was obtained.  The Pentax Gastroscope [id number] endoscope was introduced through the mouth and advanced to the second portion of the duodenum. Without limitations.  The instrument was slowly withdrawn as the mucosa was fully examined.     Findings:  Large deep but clean-based ulcer in antrum extending to level of pyloric channel.  Some mild pyloric stenosis, related to ulcer.  Some old clots in stomach, obscuring some views of fundus. No visible vessel or active bleeding from ulcer.  Mild distal esophagitis.  Otherwise normal to second portion of the duodenum          The scope was then withdrawn from the patient and the procedure completed.  ENDOSCOPIC IMPRESSION:     As above.  Large clean-based antral ulcer with mild pyloric stenosis.  No visible vessel, adherent clot or active bleeding seen.  Ulcer is highly likely NSAID-related.   RECOMMENDATIONS:     1.  Watch for potential complications of procedure. 2.  Full liquid diet, advance as tolerated. 3.  BID PPI for 8 weeks. 4.  Repeat EGD in 3 months to assess for ulcer healing. 5.  H. pylori serologies.  NO NSAIDS. 6.  If no further troubles, hopefully able to go home tomorrow.   eSigned:  Arta Silence, MD 06/17/2013 11:36 AM   CC:

## 2013-06-17 NOTE — ED Notes (Signed)
Pt states that he was a a [facility] and has been there for three days.  Pt reports he was in a metting today and started feeling weak, and when they checked his BP it was low.  Pt states that he has noticed black blood in his stool this morning.

## 2013-06-17 NOTE — ED Notes (Signed)
Md spoke with pt and advised of medical situation. Pt continues to refuse any nursing care at this time. Pt states he needs to speak with wife or mother to make decisions for him. Pt has not made a decision if he will leave or stay requested privacy while he phones family member. Will reassess in 10-15 min.

## 2013-06-17 NOTE — Consult Note (Signed)
[facility] Gastroenterology Consultation Note  Referring Provider: Dr. Orvan Falconer [facility]) Primary Care Physician:  Unice Cobble, MD  Reason for Consultation: melena, anemia  HPI: Martin Archer is a 52 y.o. male admitted with progressive weakness and black stool.  Started yesterday.  Some epigastric pain.  Recently started taking NSAIDs again.  Has history of NSAID-related pyloric channel seen on EGD in June 2011 by Dr. Sharlett Iles.   Past Medical History  Diagnosis Date  . Gastric ulcer with hemorrhage and obstruction 2011    Dr [facility]  . Anemia, unspecified   . Personal history of colonic polyps 01/30/2010    hyperplastic rectal  . Diverticulosis of colon (without mention of hemorrhage)   . Sleep apnea     on CPAP; [facility]  . Hypertension   . Hyperlipemia   . Arthritis     Past Surgical History  Procedure Laterality Date  . Inguinal hernia repair  1963  . Wisdom tooth extraction    . Nasal fracture surgery    . Carotid endarterectomy  11/2006    left Dr Victorino Dike    Prior to Admission medications   Medication Sig Start Date End Date Taking? Authorizing Provider  aspirin EC 81 MG tablet Take 81 mg by mouth daily.   Yes Historical Provider, MD  diazepam (VALIUM) 5 MG/ML injection Inject 10 mg into the vein once as needed (as needed for withdrawal seizure).   Yes Historical Provider, MD  furosemide (LASIX) 40 MG tablet Take 40 mg by mouth daily.   Yes Historical Provider, MD  gabapentin (NEURONTIN) 100 MG capsule Take 100 mg by mouth 3 (three) times daily.   Yes Historical Provider, MD  ibuprofen (ADVIL,MOTRIN) 600 MG tablet Take 600 mg by mouth every 6 (six) hours as needed for pain or fever.   Yes Historical Provider, MD  ketorolac (TORADOL) 10 MG tablet Take 10 mg by mouth every 8 (eight) hours as needed for pain.   Yes Historical Provider, MD  lisinopril (PRINIVIL,ZESTRIL) 20 MG tablet Take 20 mg by mouth daily.   Yes Historical Provider, MD   loperamide (IMODIUM A-D) 2 MG tablet Take 2 mg by mouth 4 (four) times daily as needed for diarrhea or loose stools.   Yes Historical Provider, MD  metoprolol succinate (TOPROL-XL) 100 MG 24 hr tablet Take 100 mg by mouth daily. Take with or immediately following a meal.   Yes Historical Provider, MD  Multiple Vitamins-Minerals (MULTIVITAL PO) Take 1 tablet by mouth daily. Adult multivitamin with folic acid   Yes Historical Provider, MD  Omega-3 Fatty Acids (FISH OIL PO) Take 3 g by mouth daily.   Yes Historical Provider, MD  ondansetron (ZOFRAN) 8 MG tablet Take 8 mg by mouth every 12 (twelve) hours as needed for nausea.   Yes Historical Provider, MD  OVER THE COUNTER MEDICATION Take 1 lozenge by mouth every 4 (four) hours as needed (Benzocaine sore throat lozenges).   Yes Historical Provider, MD  thiamine (VITAMIN B-1) 100 MG tablet Take 100 mg by mouth daily.   Yes Historical Provider, MD  esomeprazole (NEXIUM) 40 MG capsule Take 1 capsule (40 mg total) by mouth daily. 03/27/11 08/27/97  Sable Feil, MD  FLUoxetine (PROZAC) 20 MG capsule Take 20 mg by mouth daily.    Historical Provider, MD    Current Facility-Administered Medications  Medication Dose Route Frequency Provider Last Rate Last Dose  . dextrose 5 %-0.9 % sodium chloride infusion   Intravenous Continuous Collier Salina  Marin Comment, MD 100 mL/hr at 06/17/13 0705    . diazepam (VALIUM) injection 10 mg  10 mg Intravenous Once PRN Orvan Falconer, MD      . FLUoxetine (PROZAC) capsule 20 mg  20 mg Oral Daily Orvan Falconer, MD      . gabapentin (NEURONTIN) capsule 100 mg  100 mg Oral TID Orvan Falconer, MD      . metoprolol succinate (TOPROL-XL) 24 hr tablet 100 mg  100 mg Oral Daily Orvan Falconer, MD      . ondansetron [facility]) tablet 4 mg  4 mg Oral Q6H PRN Orvan Falconer, MD       Or  . ondansetron [facility]) injection 4 mg  4 mg Intravenous Q6H PRN Orvan Falconer, MD      . pantoprazole (PROTONIX) 80 mg in sodium chloride 0.9 % 250 mL infusion  8 mg/hr Intravenous Continuous Orvan Falconer, MD 25 mL/hr at 06/17/13 0705 8 mg/hr at 06/17/13 0705  . [START ON 06/20/2013] pantoprazole (PROTONIX) injection 40 mg  40 mg Intravenous Q12H Orvan Falconer, MD      . sodium chloride 0.9 % injection 3 mL  3 mL Intravenous Q12H Orvan Falconer, MD        Allergies as of 06/16/2013 - Review Complete 06/16/2013  Allergen Reaction Noted  . Sertraline hcl  07/01/2009    Family History  Problem Relation Age of Onset  . Diabetes Father   . Hypertension Father   . Stroke Father     in late 81s  . Heart attack Mother     in 37s  . Heart attack Brother 60  . Diabetes Paternal Grandfather   . Cancer Maternal Grandfather     unknown type  . Heart attack Maternal Grandfather 76  . Colon cancer Neg Hx   . Stroke Paternal Uncle     in late 31s    History   Social History  . Marital Status: Married    Spouse Name: N/A    Number of Children: N/A  . Years of Education: N/A   Occupational History  . self employeed sales    Social History Main Topics  . Smoking status: Former Smoker    Quit date: 06/27/2006  . Smokeless tobacco: Never Used  . Alcohol Use: Yes     Comment: currently at [facility] for treatment.   . Drug Use: No  . Sexual Activity: Not on file   Other Topics Concern  . Not on file   Social History Narrative  . No narrative on file    Review of Systems: ROS Dr. Marin Comment 06/17/13 reviewed and I agree  Physical Exam: Vital signs in last 24 hours: Temp:  [97.8 F (36.6 C)-98.7 F (37.1 C)] 98.4 F (36.9 C) (10/22 0730) Pulse Rate:  [73-87] 74 (10/22 0730) Resp:  [13-16] 13 (10/22 0730) BP: (88-133)/(64-78) 88/64 mmHg (10/22 0730) SpO2:  [93 %-98 %] 93 % (10/22 0730) Weight:  [86.297 kg (190 lb 4 oz)-86.5 kg (190 lb 11.2 oz)] 86.5 kg (190 lb 11.2 oz) (10/22 0620)   General:   Alert,  Well-developed, well-nourished, pleasant and cooperative in NAD Head:  Normocephalic and atraumatic. Eyes:  Sclera clear, no icterus.   Conjunctiva somewhat dry Ears:  Normal  auditory acuity. Nose:  No deformity, discharge,  or lesions. Mouth:  No deformity or lesions.  Oropharynx pale and somewhat dry Neck:  Supple; no masses or thyromegaly. Lungs:  Clear throughout to auscultation.   No wheezes, crackles, or rhonchi.  No acute distress. Heart:  Regular rate and rhythm; no murmurs, clicks, rubs,  or gallops. Abdomen:  Soft, mild epigastric tenderness and nondistended. No masses, hepatosplenomegaly or hernias noted. Normal bowel sounds, without guarding, and without rebound.     Msk:  Symmetrical without gross deformities. Normal posture. Pulses:  Normal pulses noted. Extremities:  Without clubbing or edema. Neurologic:  Alert and  oriented x4;  Diffusely weak, otherwise grossly normal neurologically. Skin:  Intact without significant lesions or rashes. Psych:  Alert and cooperative. Normal mood and affect.   Lab Results:  Recent Labs  06/16/13 2351 06/17/13 0720  WBC 14.6* 10.8*  HGB 11.7* 9.7*  HCT 34.3* 28.6*  PLT 270 200   BMET  Recent Labs  06/16/13 2351  NA 137  K 4.8  CL 96  CO2 32  GLUCOSE 109*  BUN 64*  CREATININE 1.17  CALCIUM 9.4   LFT  Recent Labs  06/17/13 0415  PROT 7.3  ALBUMIN 3.9  AST 26  ALT 22  ALKPHOS 85  BILITOT 0.5  BILIDIR 0.1  IBILI 0.4   PT/INR No results found for this basename: LABPROT, INR,  in the last 72 hours  Studies/Results: No results found.  Impression:  1.  Melena.  Hemodynamically stable. 2.  Abdominal pain without peritonitis. 3.  Acute blood loss anemia with elevated BUN.  Suspect NSAID peptic ulcer.  Plan:  1.  PPI. 2.  Clear liquids until noon ok, then NPO. 3.  EGD mid afternoon today. 4.  Patient and wife are agreeable for current course of action.   LOS: 0 days   Joycie Aerts M  06/17/2013, 9:58 AM

## 2013-06-17 NOTE — Interval H&P Note (Signed)
History and Physical Interval Note:  06/17/2013 11:12 AM  Martin Archer  has presented today for surgery, with the diagnosis of anemia, melena  The various methods of treatment have been discussed with the patient and family. After consideration of risks, benefits and other options for treatment, the patient has consented to  Procedure(s): ESOPHAGOGASTRODUODENOSCOPY (EGD) (Left) as a surgical intervention .  The patient's history has been reviewed, patient examined, no change in status, stable for surgery.  I have reviewed the patient's chart and labs.  Questions were answered to the patient's satisfaction.     Martin Archer M  Assessment:  1.  Melena 2.  Acute blood loss anemia.  Suspect NSAID-related peptic ulcer.  Plan:  1.  Endoscopy. 2.  PPI. 3.  Risks (bleeding, infection, bowel perforation that could require surgery, sedation-related changes in cardiopulmonary systems), benefits (identification and possible treatment of source of symptoms, exclusion of certain causes of symptoms), and alternatives (watchful waiting, radiographic imaging studies, empiric medical treatment) of upper endoscopy (EGD) were explained to patient/family in detail and patient wishes to proceed.

## 2013-06-17 NOTE — H&P (View-Only) (Signed)
[facility] Gastroenterology Consultation Note  Referring Provider: Dr. Orvan Falconer [facility]) Primary Care Physician:  Unice Cobble, MD  Reason for Consultation: melena, anemia  HPI: Martin Archer is a 52 y.o. male admitted with progressive weakness and black stool.  Started yesterday.  Some epigastric pain.  Recently started taking NSAIDs again.  Has history of NSAID-related pyloric channel seen on EGD in June 2011 by Dr. Sharlett Iles.   Past Medical History  Diagnosis Date  . Gastric ulcer with hemorrhage and obstruction 2011    Dr [facility]  . Anemia, unspecified   . Personal history of colonic polyps 01/30/2010    hyperplastic rectal  . Diverticulosis of colon (without mention of hemorrhage)   . Sleep apnea     on CPAP; [facility]  . Hypertension   . Hyperlipemia   . Arthritis     Past Surgical History  Procedure Laterality Date  . Inguinal hernia repair  1963  . Wisdom tooth extraction    . Nasal fracture surgery    . Carotid endarterectomy  11/2006    left Dr Victorino Dike    Prior to Admission medications   Medication Sig Start Date End Date Taking? Authorizing Provider  aspirin EC 81 MG tablet Take 81 mg by mouth daily.   Yes Historical Provider, MD  diazepam (VALIUM) 5 MG/ML injection Inject 10 mg into the vein once as needed (as needed for withdrawal seizure).   Yes Historical Provider, MD  furosemide (LASIX) 40 MG tablet Take 40 mg by mouth daily.   Yes Historical Provider, MD  gabapentin (NEURONTIN) 100 MG capsule Take 100 mg by mouth 3 (three) times daily.   Yes Historical Provider, MD  ibuprofen (ADVIL,MOTRIN) 600 MG tablet Take 600 mg by mouth every 6 (six) hours as needed for pain or fever.   Yes Historical Provider, MD  ketorolac (TORADOL) 10 MG tablet Take 10 mg by mouth every 8 (eight) hours as needed for pain.   Yes Historical Provider, MD  lisinopril (PRINIVIL,ZESTRIL) 20 MG tablet Take 20 mg by mouth daily.   Yes Historical Provider, MD   loperamide (IMODIUM A-D) 2 MG tablet Take 2 mg by mouth 4 (four) times daily as needed for diarrhea or loose stools.   Yes Historical Provider, MD  metoprolol succinate (TOPROL-XL) 100 MG 24 hr tablet Take 100 mg by mouth daily. Take with or immediately following a meal.   Yes Historical Provider, MD  Multiple Vitamins-Minerals (MULTIVITAL PO) Take 1 tablet by mouth daily. Adult multivitamin with folic acid   Yes Historical Provider, MD  Omega-3 Fatty Acids (FISH OIL PO) Take 3 g by mouth daily.   Yes Historical Provider, MD  ondansetron (ZOFRAN) 8 MG tablet Take 8 mg by mouth every 12 (twelve) hours as needed for nausea.   Yes Historical Provider, MD  OVER THE COUNTER MEDICATION Take 1 lozenge by mouth every 4 (four) hours as needed (Benzocaine sore throat lozenges).   Yes Historical Provider, MD  thiamine (VITAMIN B-1) 100 MG tablet Take 100 mg by mouth daily.   Yes Historical Provider, MD  esomeprazole (NEXIUM) 40 MG capsule Take 1 capsule (40 mg total) by mouth daily. 03/27/11 08/27/97  Sable Feil, MD  FLUoxetine (PROZAC) 20 MG capsule Take 20 mg by mouth daily.    Historical Provider, MD    Current Facility-Administered Medications  Medication Dose Route Frequency Provider Last Rate Last Dose  . dextrose 5 %-0.9 % sodium chloride infusion   Intravenous Continuous Collier Salina  Marin Comment, MD 100 mL/hr at 06/17/13 0705    . diazepam (VALIUM) injection 10 mg  10 mg Intravenous Once PRN Orvan Falconer, MD      . FLUoxetine (PROZAC) capsule 20 mg  20 mg Oral Daily Orvan Falconer, MD      . gabapentin (NEURONTIN) capsule 100 mg  100 mg Oral TID Orvan Falconer, MD      . metoprolol succinate (TOPROL-XL) 24 hr tablet 100 mg  100 mg Oral Daily Orvan Falconer, MD      . ondansetron [facility]) tablet 4 mg  4 mg Oral Q6H PRN Orvan Falconer, MD       Or  . ondansetron [facility]) injection 4 mg  4 mg Intravenous Q6H PRN Orvan Falconer, MD      . pantoprazole (PROTONIX) 80 mg in sodium chloride 0.9 % 250 mL infusion  8 mg/hr Intravenous Continuous Orvan Falconer, MD 25 mL/hr at 06/17/13 0705 8 mg/hr at 06/17/13 0705  . [START ON 06/20/2013] pantoprazole (PROTONIX) injection 40 mg  40 mg Intravenous Q12H Orvan Falconer, MD      . sodium chloride 0.9 % injection 3 mL  3 mL Intravenous Q12H Orvan Falconer, MD        Allergies as of 06/16/2013 - Review Complete 06/16/2013  Allergen Reaction Noted  . Sertraline hcl  07/01/2009    Family History  Problem Relation Age of Onset  . Diabetes Father   . Hypertension Father   . Stroke Father     in late 39s  . Heart attack Mother     in 75s  . Heart attack Brother 72  . Diabetes Paternal Grandfather   . Cancer Maternal Grandfather     unknown type  . Heart attack Maternal Grandfather 76  . Colon cancer Neg Hx   . Stroke Paternal Uncle     in late 57s    History   Social History  . Marital Status: Married    Spouse Name: N/A    Number of Children: N/A  . Years of Education: N/A   Occupational History  . self employeed sales    Social History Main Topics  . Smoking status: Former Smoker    Quit date: 06/27/2006  . Smokeless tobacco: Never Used  . Alcohol Use: Yes     Comment: currently at [facility] for treatment.   . Drug Use: No  . Sexual Activity: Not on file   Other Topics Concern  . Not on file   Social History Narrative  . No narrative on file    Review of Systems: ROS Dr. Marin Comment 06/17/13 reviewed and I agree  Physical Exam: Vital signs in last 24 hours: Temp:  [97.8 F (36.6 C)-98.7 F (37.1 C)] 98.4 F (36.9 C) (10/22 0730) Pulse Rate:  [73-87] 74 (10/22 0730) Resp:  [13-16] 13 (10/22 0730) BP: (88-133)/(64-78) 88/64 mmHg (10/22 0730) SpO2:  [93 %-98 %] 93 % (10/22 0730) Weight:  [86.297 kg (190 lb 4 oz)-86.5 kg (190 lb 11.2 oz)] 86.5 kg (190 lb 11.2 oz) (10/22 0620)   General:   Alert,  Well-developed, well-nourished, pleasant and cooperative in NAD Head:  Normocephalic and atraumatic. Eyes:  Sclera clear, no icterus.   Conjunctiva somewhat dry Ears:  Normal  auditory acuity. Nose:  No deformity, discharge,  or lesions. Mouth:  No deformity or lesions.  Oropharynx pale and somewhat dry Neck:  Supple; no masses or thyromegaly. Lungs:  Clear throughout to auscultation.   No wheezes, crackles, or rhonchi.  No acute distress. Heart:  Regular rate and rhythm; no murmurs, clicks, rubs,  or gallops. Abdomen:  Soft, mild epigastric tenderness and nondistended. No masses, hepatosplenomegaly or hernias noted. Normal bowel sounds, without guarding, and without rebound.     Msk:  Symmetrical without gross deformities. Normal posture. Pulses:  Normal pulses noted. Extremities:  Without clubbing or edema. Neurologic:  Alert and  oriented x4;  Diffusely weak, otherwise grossly normal neurologically. Skin:  Intact without significant lesions or rashes. Psych:  Alert and cooperative. Normal mood and affect.   Lab Results:  Recent Labs  06/16/13 2351 06/17/13 0720  WBC 14.6* 10.8*  HGB 11.7* 9.7*  HCT 34.3* 28.6*  PLT 270 200   BMET  Recent Labs  06/16/13 2351  NA 137  K 4.8  CL 96  CO2 32  GLUCOSE 109*  BUN 64*  CREATININE 1.17  CALCIUM 9.4   LFT  Recent Labs  06/17/13 0415  PROT 7.3  ALBUMIN 3.9  AST 26  ALT 22  ALKPHOS 85  BILITOT 0.5  BILIDIR 0.1  IBILI 0.4   PT/INR No results found for this basename: LABPROT, INR,  in the last 72 hours  Studies/Results: No results found.  Impression:  1.  Melena.  Hemodynamically stable. 2.  Abdominal pain without peritonitis. 3.  Acute blood loss anemia with elevated BUN.  Suspect NSAID peptic ulcer.  Plan:  1.  PPI. 2.  Clear liquids until noon ok, then NPO. 3.  EGD mid afternoon today. 4.  Patient and wife are agreeable for current course of action.   LOS: 0 days   Zsofia Prout M  06/17/2013, 9:58 AM

## 2013-06-18 ENCOUNTER — Encounter (HOSPITAL_COMMUNITY): Payer: Self-pay | Admitting: Gastroenterology

## 2013-06-18 DIAGNOSIS — K250 Acute gastric ulcer with hemorrhage: Principal | ICD-10-CM

## 2013-06-18 DIAGNOSIS — F17200 Nicotine dependence, unspecified, uncomplicated: Secondary | ICD-10-CM | POA: Diagnosis present

## 2013-06-18 LAB — BASIC METABOLIC PANEL
Calcium: 8.8 mg/dL (ref 8.4–10.5)
Chloride: 104 mEq/L (ref 96–112)
GFR calc Af Amer: 88 mL/min — ABNORMAL LOW (ref >90)
GFR calc non Af Amer: 76 mL/min — ABNORMAL LOW (ref >90)
Glucose, Bld: 90 mg/dL (ref 70–99)
Potassium: 4.1 mEq/L (ref 3.5–5.1)
Sodium: 139 mEq/L (ref 135–145)

## 2013-06-18 LAB — HELICOBACTER PYLORI ABS-IGG+IGA, BLD
H Pylori IgA: 1.6 U/mL (ref <9.0)
H Pylori IgG: <0.4 {ISR}

## 2013-06-18 LAB — CBC
HCT: 28.3 % — ABNORMAL LOW (ref 39.0–52.0)
Hemoglobin: 9.6 g/dL — ABNORMAL LOW (ref 13.0–17.0)
MCH: 33.2 pg (ref 26.0–34.0)
Platelets: 210 10*3/uL (ref 150–400)
RBC: 2.89 MIL/uL — ABNORMAL LOW (ref 4.22–5.81)
WBC: 9.4 10*3/uL (ref 4.0–10.5)

## 2013-06-18 MED ORDER — METOPROLOL SUCCINATE ER 25 MG PO TB24: 12.5000 mg | ORAL_TABLET | Freq: Every day | ORAL | Status: DC

## 2013-06-18 MED ORDER — ACETAMINOPHEN 325 MG PO TABS
650.0000 mg | ORAL_TABLET | ORAL | Status: DC | PRN
  Administered 2013-06-18: 650 mg via ORAL
  Filled 2013-06-18: qty 2

## 2013-06-18 MED ORDER — METOPROLOL TARTRATE 12.5 MG HALF TABLET
12.5000 mg | ORAL_TABLET | Freq: Two times a day (BID) | ORAL | Status: DC
  Administered 2013-06-18: 12.5 mg via ORAL
  Filled 2013-06-18 (×2): qty 1

## 2013-06-18 MED ORDER — PANTOPRAZOLE SODIUM 40 MG PO TBEC: 40.0000 mg | DELAYED_RELEASE_TABLET | Freq: Two times a day (BID) | ORAL | Status: DC

## 2013-06-18 MED ORDER — HYDROCODONE-ACETAMINOPHEN 5-300 MG PO TABS: 1.0000 | ORAL_TABLET | Freq: Four times a day (QID) | ORAL | Status: DC | PRN

## 2013-06-18 MED ORDER — SENNOSIDES-DOCUSATE SODIUM 8.6-50 MG PO TABS: 1.0000 | ORAL_TABLET | Freq: Every day | ORAL | Status: DC

## 2013-06-18 MED ORDER — NICOTINE 7 MG/24HR TD PT24: 1.0000 | MEDICATED_PATCH | TRANSDERMAL | Status: DC

## 2013-06-18 MED ORDER — NICOTINE 14 MG/24HR TD PT24: 1.0000 | MEDICATED_PATCH | TRANSDERMAL | Status: DC

## 2013-06-18 NOTE — Progress Notes (Signed)
Subjective: No further bleeding. Tolerating diet. No abdominal pain.  Objective: Vital signs in last 24 hours: Temp:  [97.8 F (36.6 C)-99.1 F (37.3 C)] 98.9 F (37.2 C) (10/23 0435) Pulse Rate:  [50-94] 94 (10/23 0730) Resp:  [12-36] 18 (10/23 0730) BP: (52-121)/(39-93) 95/71 mmHg (10/23 0730) SpO2:  [76 %-100 %] 94 % (10/23 0730) Weight:  [86.5 kg (190 lb 11.2 oz)] 86.5 kg (190 lb 11.2 oz) (10/23 0435) Weight change: 0.203 kg (7.2 oz) Last BM Date: 06/17/13  PE: GEN:  NAD ABD:  Soft  Lab Results: CBC    Component Value Date/Time   WBC 9.4 06/18/2013 0500   RBC 2.89* 06/18/2013 0500   HGB 9.6* 06/18/2013 0500   HCT 28.3* 06/18/2013 0500   PLT 210 06/18/2013 0500   MCV 97.9 06/18/2013 0500   MCH 33.2 06/18/2013 0500   MCHC 33.9 06/18/2013 0500   RDW 12.5 06/18/2013 0500   LYMPHSABS 3.0 06/16/2013 2351   MONOABS 1.5* 06/16/2013 2351   EOSABS 0.1 06/16/2013 2351   BASOSABS 0.0 06/16/2013 2351   Assessment:  1.  NSAID-related gastric ulcer with melena and acute blood loss anemia.  No further bleeding.  Plan:  1.   No NSAIDs unless absolutely required. 2.   Protonix 40 mg po bid for at least the next three months. 3.   Awaiting H. Pylori serologies. 4.   Advance diet. 5.  Ok to discharge today from GI perspective; can follow-up with me (Dr. Paulita Fujita, [facility] [phone]) or Dr. Sharlett Iles ([facility]) in the next few weeks.  Will need repeat endoscopy in 3 months to assess for ulcer healing. 6.  Will sign-off; please call with questions; thank you for the consult.   Martin Archer 06/18/2013, 8:39 AM

## 2013-06-18 NOTE — Progress Notes (Signed)
Report given to [place].  Patient will be transported by wife.

## 2013-06-18 NOTE — Discharge Summary (Signed)
Physician Discharge Summary  Martin Archer [id number] DOB: May 25, 1961 DOA: 06/17/2013  PCP: Unice Cobble, MD  Admit date: 06/17/2013 Discharge date: 06/18/2013  Time spent: >45 minutes  Recommendations for Outpatient Follow-up:  1. Ensure pt is not Iron deficient   Discharge Diagnoses:  Principal Problem:   Upper GI bleed Active Problems:   HYPERLIPIDEMIA   HYPERTENSION   ACUTE GASTRIC ULCER W/HEMORRHAGE AND OBSTRUCTION   SLEEP APNEA   Back pain, chronic   Nicotine dependence   Discharge Condition: stable  Diet recommendation: heart healthy/ blanc  Filed Weights   06/16/13 2347 06/17/13 0620 06/18/13 0435  Weight: 86.297 kg (190 lb 4 oz) 86.5 kg (190 lb 11.2 oz) 86.5 kg (190 lb 11.2 oz)    History of present illness:  52 y.o. male with hx of bleeding gastric ulcer in 2011 (Dr Sharlett Iles of [facility]), Hx of chronic back pain on chronic NSAIDS, hx of HTN, ETOH, chronic anemia, hyperlipidemia, s/p Left carotid endarterectomy, not on iron supplement or taking Pepto Bismol, presented to the ER with epigastric pain and melanotic stool for one day. He also had been feeling lightheaded and was presyncopal.  Evalatuion in the ER included a Hb of 11.7 g/DL, with stable hemodynamics. His BUN was elevated to 64 with normal Cr. In the ER, he was guaic positive with black stool. [facility] was asked to admit him for UGIB, after several instances where he was going to sign out AMA.   Hospital Course:  UGIB - prior hx of gastric ulcer  - due to use of NSAIDS (ibuprofen and toradol) for back pain  - s/p EGD on 10/22 - BID Protonix for 8 wks - H Pylori serology negative - f/u with GI - repeat EGD in 3 mo  Symptomatic Acute blood loss anemia  - Hgb dropped dfrom 11- 9 - no transfusion needed- pt asymptomatic  Back pain - f/u with ortho for medications to manage it - will d/c with a prescription for Vicodin for now.   Sleep apnea   HTN   HLD   PVD s/p CEA  2008   Chronic low back pain  Chronic Alcohol abuse  - currently at [facility]   Discharge Exam: Filed Vitals:   06/18/13 1122  BP: 99/71  Pulse: 79  Temp: 98.2 F (36.8 C)  Resp: 14    General: AAO x3 , no distress Cardiovascular: RRR, no murmurs Respiratory: CTA b/l   Discharge Instructions      Discharge Orders   Future Orders Complete By Expires   Diet - low sodium heart healthy  As directed    Scheduling Instructions:     Bland, no caffeine, no alcohol No NSAIDS   Increase activity slowly  As directed        Medication List    STOP taking these medications       aspirin EC 81 MG tablet     esomeprazole 40 MG capsule  Commonly known as:  NEXIUM     ibuprofen 600 MG tablet  Commonly known as:  ADVIL,MOTRIN     ketorolac 10 MG tablet  Commonly known as:  TORADOL     lisinopril 20 MG tablet  Commonly known as:  PRINIVIL,ZESTRIL     loperamide 2 MG tablet  Commonly known as:  IMODIUM A-D     ondansetron 8 MG tablet  Commonly known as:  ZOFRAN      TAKE these medications       diazepam 5 MG/ML  injection  Commonly known as:  VALIUM  Inject 10 mg into the vein once as needed (as needed for withdrawal seizure).     FISH OIL PO  Take 3 g by mouth daily.     FLUoxetine 20 MG capsule  Commonly known as:  PROZAC  Take 20 mg by mouth daily.     furosemide 40 MG tablet  Commonly known as:  LASIX  Take 40 mg by mouth daily.     gabapentin 100 MG capsule  Commonly known as:  NEURONTIN  Take 100 mg by mouth 3 (three) times daily.     Hydrocodone-Acetaminophen 5-300 MG Tabs  Commonly known as:  VICODIN  Take 1 tablet by mouth every 6 (six) hours as needed.     metoprolol succinate 25 MG 24 hr tablet  Commonly known as:  TOPROL XL  Take 0.5 tablets (12.5 mg total) by mouth daily.     MULTIVITAL PO  Take 1 tablet by mouth daily. Adult multivitamin with folic acid     nicotine 14 mg/24hr patch  Commonly known as:  NICODERM CQ - dosed in  mg/24 hours  Place 1 patch onto the skin daily.     nicotine 7 mg/24hr patch  Commonly known as:  NICODERM CQ  Place 1 patch onto the skin daily.     OVER THE COUNTER MEDICATION  Take 1 lozenge by mouth every 4 (four) hours as needed (Benzocaine sore throat lozenges).     pantoprazole 40 MG tablet  Commonly known as:  PROTONIX  Take 1 tablet (40 mg total) by mouth 2 (two) times daily.     senna-docusate 8.6-50 MG per tablet  Commonly known as:  Senokot-S  Take 1 tablet by mouth daily.     thiamine 100 MG tablet  Commonly known as:  VITAMIN B-1  Take 100 mg by mouth daily.       Allergies  Allergen Reactions  . Sertraline Hcl     REACTION: diarrhea   Follow-up Information   Follow up with Landry Dyke, MD In 3 weeks.   Specialty:  Gastroenterology   Contact information:   [id number] [place] [address] [phone]       Follow up with Unice Cobble, MD. Schedule an appointment as soon as possible for a visit in 2 weeks.   Specialty:  Internal Medicine   Contact information:   [phone] [address] [facility] [address] [place] [address] [phone]        The results of significant diagnostics from this hospitalization (including imaging, microbiology, ancillary and laboratory) are listed below for reference.    Significant Diagnostic Studies: No results found.  Microbiology: Recent Results (from the past 240 hour(s))  MRSA PCR SCREENING     Status: None   Collection Time    06/17/13  6:25 AM      Result Value Range Status   MRSA by PCR NEGATIVE  NEGATIVE Final   Comment:            The GeneXpert MRSA Assay (FDA     approved for NASAL specimens     only), is one component of a     comprehensive MRSA colonization     surveillance program. It is not     intended to diagnose MRSA     infection nor to guide or     monitor treatment for     MRSA infections.     Labs: Basic Metabolic Panel:  Recent Labs Lab  06/16/13 2351 06/18/13 0500  NA 137 139  K 4.8 4.1  CL 96 104  CO2 32 27  GLUCOSE 109* 90  BUN 64* 18  CREATININE 1.17 1.08  CALCIUM 9.4 8.8   Liver Function Tests:  Recent Labs Lab 06/17/13 0415  AST 26  ALT 22  ALKPHOS 85  BILITOT 0.5  PROT 7.3  ALBUMIN 3.9    Recent Labs Lab 06/17/13 0415  LIPASE 41   No results found for this basename: AMMONIA,  in the last 168 hours CBC:  Recent Labs Lab 06/16/13 2351 06/17/13 0720 06/17/13 1022 06/17/13 1925 06/18/13 0500  WBC 14.6* 10.8* 9.5 10.6* 9.4  NEUTROABS 10.0*  --   --   --   --   HGB 11.7* 9.7* 9.4* 9.0* 9.6*  HCT 34.3* 28.6* 27.7* 26.9* 28.3*  MCV 98.0 96.6 96.5 98.2 97.9  PLT 270 200 196 199 210   Cardiac Enzymes: No results found for this basename: CKTOTAL, CKMB, CKMBINDEX, TROPONINI,  in the last 168 hours BNP: BNP (last 3 results) No results found for this basename: PROBNP,  in the last 8760 hours CBG: No results found for this basename: GLUCAP,  in the last 168 hours     Signed:  [place]  [facility] 06/18/2013, 2:11 PM

## 2013-06-18 NOTE — Progress Notes (Signed)
Patient is discharged by Charge Nurse, Primary Nurse is made aware and updated on patient's discharge.

## 2013-06-18 NOTE — Clinical Social Work Note (Signed)
CSW received consult to facilitate discharge for pt back to [facility]. CSW faxed clinicals to nurses' station at [facility], per nurse request, and has provided nurse at [facility] with pt's current nurse at [facility] phone number so nurses can coordinate discharge.    Ky Barban, MSW, [facility] Clinical Social Worker [phone]

## 2013-07-14 ENCOUNTER — Encounter: Payer: Self-pay | Admitting: Internal Medicine

## 2013-07-14 ENCOUNTER — Ambulatory Visit (INDEPENDENT_AMBULATORY_CARE_PROVIDER_SITE_OTHER): Payer: BC Managed Care – PPO | Admitting: Internal Medicine

## 2013-07-14 VITALS — BP 127/93 | HR 96 | Temp 98.7°F | Ht 67.5 in | Wt 188.2 lb

## 2013-07-14 DIAGNOSIS — F988 Other specified behavioral and emotional disorders with onset usually occurring in childhood and adolescence: Secondary | ICD-10-CM

## 2013-07-14 DIAGNOSIS — R609 Edema, unspecified: Secondary | ICD-10-CM

## 2013-07-14 DIAGNOSIS — F1011 Alcohol abuse, in remission: Secondary | ICD-10-CM

## 2013-07-14 DIAGNOSIS — F17200 Nicotine dependence, unspecified, uncomplicated: Secondary | ICD-10-CM

## 2013-07-14 NOTE — Progress Notes (Signed)
  Subjective:    Patient ID: Martin Archer, male    DOB: 10-05-1960, 52 y.o.   MRN: [id number]  HPI   He has had ankle edema since mid October 2014. This does resolve overnight. It is not associated with calf pain.  He's had edema despite taking furosemide and HCTZ.  He was recently hospitalized with a gastric ulcer in the context of alcohol abuse, caffeine intake, and smoking.His albumin, creatinine, and liver function tests were normal. He has quit drinking and is now in a program at [facility].  He is not on amlodipine. He has had increased salt intake in the program     Review of Systems  He also denies chest pain, palpitations, or paroxysmal nocturnal dyspnea. Initially he had dyspnea on exertion but this is improved with a recent exercise program. ADD reassessed @ [facility]. He wants to pursue medication for this & possibly for tobacco weaning.     Objective:   Physical Exam Appears well-nourished & in no acute distress  L carotid bruit & op scar present.No neck pain distention present at 10 - 15 degrees. Thyroid normal to palpation  Heart rhythm and rate are normal with no significant murmurs or gallops.  Chest is clear with no increased work of breathing  There is no evidence of aortic aneurysm or renal artery bruits  Abdomen soft with no organomegaly or masses. No HJR  No clubbing, cyanosis or edema present.  PTP pulses are intact . Decreased DPP . Feet cool  No ischemic skin changes are present . Nails healthy with chronic fungal changes   Alert and oriented. Strength, tone, DTRs reflexes normal          Assessment & Plan:  #1 edema probably related to venous insufficiency & recent increased sodium intake #2 ADD #3 smoker See recommendations. If edema persists despite current recommendations; reassessment of sleep apnea indicated

## 2013-07-14 NOTE — Patient Instructions (Signed)
Your ideal BP goal = AVERAGE < 135/85.Minimal goal is average < 140/90. Avoid ingestion of  excess salt/sodium.Cook with pepper & other spices . Use the salt substitute "No Salt" OR the Mrs Deliah Boston products to season food @ the table. Avoid foods which taste salty or "vinegary" as their sodium content will be high. Wear over the calf support hose if you are standing for prolonged periods of  time or working on your feet for prolonged periods of time.

## 2013-07-14 NOTE — Progress Notes (Signed)
Pre visit review using our clinic review tool, if applicable. No additional management support is needed unless otherwise documented below in the visit note. 

## 2013-07-15 NOTE — Assessment & Plan Note (Signed)
I defer to Psychologist who diagnosed this as to treatment

## 2013-07-15 NOTE — Assessment & Plan Note (Signed)
?   Consider Wellbutrin in view of smoking & ADD; but I defer to his Psychologist as to this therapy or Chantix. Alcohol issues are paramount @ this time

## 2013-07-15 NOTE — Assessment & Plan Note (Signed)
Focus on alcohol abstinence through [facility]'s program. Defer tobacco weaning until this resolved completely.

## 2013-09-17 ENCOUNTER — Encounter: Payer: Self-pay | Admitting: Gastroenterology

## 2013-10-02 ENCOUNTER — Other Ambulatory Visit: Payer: Self-pay | Admitting: Internal Medicine

## 2013-10-02 ENCOUNTER — Encounter: Payer: Self-pay | Admitting: Internal Medicine

## 2013-10-02 ENCOUNTER — Ambulatory Visit (INDEPENDENT_AMBULATORY_CARE_PROVIDER_SITE_OTHER): Payer: BC Managed Care – PPO | Admitting: Internal Medicine

## 2013-10-02 ENCOUNTER — Encounter: Payer: Self-pay | Admitting: Gastroenterology

## 2013-10-02 ENCOUNTER — Ambulatory Visit (INDEPENDENT_AMBULATORY_CARE_PROVIDER_SITE_OTHER): Payer: BC Managed Care – PPO | Admitting: Gastroenterology

## 2013-10-02 ENCOUNTER — Other Ambulatory Visit (INDEPENDENT_AMBULATORY_CARE_PROVIDER_SITE_OTHER): Payer: BC Managed Care – PPO

## 2013-10-02 VITALS — BP 130/92 | HR 90 | Temp 97.4°F | Resp 13 | Wt 195.1 lb

## 2013-10-02 VITALS — BP 130/78 | HR 88 | Ht 68.0 in | Wt 196.0 lb

## 2013-10-02 DIAGNOSIS — F17200 Nicotine dependence, unspecified, uncomplicated: Secondary | ICD-10-CM

## 2013-10-02 DIAGNOSIS — F1021 Alcohol dependence, in remission: Secondary | ICD-10-CM

## 2013-10-02 DIAGNOSIS — M549 Dorsalgia, unspecified: Secondary | ICD-10-CM

## 2013-10-02 DIAGNOSIS — F1020 Alcohol dependence, uncomplicated: Secondary | ICD-10-CM

## 2013-10-02 DIAGNOSIS — I10 Essential (primary) hypertension: Secondary | ICD-10-CM

## 2013-10-02 DIAGNOSIS — K3189 Other diseases of stomach and duodenum: Principal | ICD-10-CM

## 2013-10-02 DIAGNOSIS — E785 Hyperlipidemia, unspecified: Secondary | ICD-10-CM

## 2013-10-02 DIAGNOSIS — K254 Chronic or unspecified gastric ulcer with hemorrhage: Secondary | ICD-10-CM

## 2013-10-02 DIAGNOSIS — Z23 Encounter for immunization: Secondary | ICD-10-CM

## 2013-10-02 DIAGNOSIS — G8929 Other chronic pain: Secondary | ICD-10-CM

## 2013-10-02 DIAGNOSIS — G4730 Sleep apnea, unspecified: Secondary | ICD-10-CM

## 2013-10-02 LAB — BASIC METABOLIC PANEL
BUN: 13 mg/dL (ref 6–23)
CO2: 29 mEq/L (ref 19–32)
Calcium: 9.5 mg/dL (ref 8.4–10.5)
Chloride: 101 mEq/L (ref 96–112)
Creatinine, Ser: 1 mg/dL (ref 0.4–1.5)
GFR: 83.5 mL/min (ref >60.00)
Glucose, Bld: 77 mg/dL (ref 70–99)
Potassium: 4.3 mEq/L (ref 3.5–5.1)
Sodium: 139 mEq/L (ref 135–145)

## 2013-10-02 LAB — HEPATIC FUNCTION PANEL
ALT: 29 U/L (ref 0–53)
AST: 27 U/L (ref 0–37)
Albumin: 4.3 g/dL (ref 3.5–5.2)
Alkaline Phosphatase: 83 U/L (ref 39–117)
BILIRUBIN DIRECT: 0.1 mg/dL (ref 0.0–0.3)
BILIRUBIN TOTAL: 0.6 mg/dL (ref 0.3–1.2)
Total Protein: 7.7 g/dL (ref 6.0–8.3)

## 2013-10-02 LAB — TSH: TSH: 1.73 u[IU]/mL (ref 0.35–5.50)

## 2013-10-02 MED ORDER — PANTOPRAZOLE SODIUM 40 MG PO TBEC: 40.0000 mg | DELAYED_RELEASE_TABLET | Freq: Two times a day (BID) | ORAL | Status: DC

## 2013-10-02 NOTE — Patient Instructions (Signed)
Your next office appointment will be determined based upon review of your pending labs . Those instructions will be transmitted to you through My Chart  or by mail if you're not using this system.   Minimal Blood Pressure Goal= AVERAGE < 140/90;  Ideal is an AVERAGE < 135/85. This AVERAGE should be calculated from @ least 5-7 BP readings taken @ different times of day on different days of week. You should not respond to isolated BP readings , but rather the AVERAGE for that week .Please bring your  blood pressure cuff to office visits to verify that it is reliable.It  can also be checked against the blood pressure device at the pharmacy. Finger or wrist cuffs are not dependable; an arm cuff is.   The best exercises for the low back include freestyle swimming, stretch aerobics, and yoga. Assess response to the gabapentin one every 8 hours as needed. If it is partially beneficial, it can be increased up to a total of 3 pills every 8 hours as needed. This increase of 1 pill each dose  should take place over 72 hours at least.

## 2013-10-02 NOTE — Progress Notes (Signed)
Pre visit review using our clinic review tool, if applicable. No additional management support is needed unless otherwise documented below in the visit note. 

## 2013-10-02 NOTE — Progress Notes (Signed)
This is a 53 year old Caucasian male who was hospitalized in October with acute upper GI bleeding from an NSAID-induced gastric ulcer.  He been on pantoprazole 40 mg a day since his hospitalization he is currently asymptomatic.  Biopsies for H. pylori were negative.  He continues to use occasional Excedrin for headache, but denies use of other NSAIDs.  He was an alcoholic at the time of his admission, and is now in recovery going to 2 AA meetings a day.  His appetite is good his weight is stable.  He denies melena, hematochezia, any other upper GI or hepatobiliary or lower GI complaints.  He does take gabapentin 100 mg 3 times a day for back pain.  His primary care physician is Dr. Unice Cobble.  Current Medications, Allergies, Past Medical History, Past Surgical History, Family History and Social History were reviewed in [facility] record.  ROS: All systems were reviewed and are negative unless otherwise stated in the HPI.   Allergies  Allergen Reactions  . Adderall [Amphetamine-Dextroamphet Er]     diarrhea  . Prozac [Fluoxetine Hcl]     diarrhea  . Sertraline Hcl     REACTION: diarrhea   Outpatient Prescriptions Prior to Visit  Medication Sig Dispense Refill  . gabapentin (NEURONTIN) 100 MG capsule Take 100 mg by mouth 3 (three) times daily.      . metoprolol succinate (TOPROL XL) 25 MG 24 hr tablet Take 0.5 tablets (12.5 mg total) by mouth daily.  30 tablet  0  . metoprolol tartrate (LOPRESSOR) 25 MG tablet Take 1 tablet by mouth daily.      . mirtazapine (REMERON SOL-TAB) 15 MG disintegrating tablet Take 15 mg by mouth at bedtime.      . Multiple Vitamins-Minerals (MULTIVITAL PO) Take 1 tablet by mouth daily. Adult multivitamin with folic acid      . Omega-3 Fatty Acids (FISH OIL PO) Take 3 g by mouth daily.      . pantoprazole (PROTONIX) 40 MG tablet Take 1 tablet (40 mg total) by mouth 2 (two) times daily.  60 tablet  3  . senna-docusate (SENOKOT-S) 8.6-50 MG  per tablet Take 1 tablet by mouth daily.  30 tablet  0  . thiamine (VITAMIN B-1) 100 MG tablet Take 100 mg by mouth daily.      . traZODone (DESYREL) 50 MG tablet Take 1 tablet by mouth daily.      . furosemide (LASIX) 40 MG tablet Take 40 mg by mouth daily.      . hydrochlorothiazide (MICROZIDE) 12.5 MG capsule Take 12.5 mg by mouth daily.       No facility-administered medications prior to visit.   Past Medical History  Diagnosis Date  . Gastric ulcer with hemorrhage and obstruction 2011    Dr [facility]  . Anemia, unspecified   . Personal history of colonic polyps 01/30/2010    hyperplastic rectal  . Diverticulosis of colon (without mention of hemorrhage)   . Sleep apnea     on CPAP; [facility]  . Hypertension   . Hyperlipemia   . Arthritis    Past Surgical History  Procedure Laterality Date  . Inguinal hernia repair  1963  . Wisdom tooth extraction    . Nasal fracture surgery    . Carotid endarterectomy  11/2006    left Dr Mamie Nick  . Esophagogastroduodenoscopy Left 06/17/2013    Procedure: ESOPHAGOGASTRODUODENOSCOPY (EGD);  Surgeon: Arta Silence, MD;  Location: [facility];  Service:  Endoscopy;  Laterality: Left;   History   Social History  . Marital Status: Married    Spouse Name: N/A    Number of Children: N/A  . Years of Education: N/A   Occupational History  . self employeed sales    Social History Main Topics  . Smoking status: Former Smoker    Quit date: 06/27/2006  . Smokeless tobacco: Never Used     Comment: smoked 1976-2007 & 2009- present , up to 2 ppd, average > 1ppd  . Alcohol Use: No     Comment: currently at [facility] for treatment.   . Drug Use: No  . Sexual Activity: None   Other Topics Concern  . None   Social History Narrative  . None   Family History  Problem Relation Age of Onset  . Diabetes Father   . Hypertension Father   . Stroke Father     in late 40s  . Heart attack Mother     in 25s  .  Heart attack Brother 73  . Diabetes Paternal Grandfather   . Cancer Maternal Grandfather     unknown type  . Heart attack Maternal Grandfather 76  . Colon cancer Neg Hx   . Stroke Paternal Uncle     in late 44s            Physical Exam: Healthy-appearing patient in no distress.  Blood pressure 130/78, pulse 88 and regular, and weight 196 with a BMI of 29.81.  I cannot appreciate stigmata of chronic liver disease.  Chest is clear and he appears to be in a regular rhythm without murmurs gallops or rubs.  There is no hepatosplenomegaly, abdominal masses or tenderness.  Bowel sounds are normal.  Peripheral extremities are unremarkable and mental status is normal    Assessment and Plan: Alcoholic patient in recovery who had an upper GI hemorrhage from an NSAID-induced gastric ulcer in October of last year.  He currently is on PPI therapy and denies any GI complaints.  He is successful in his attempts to stop drinking at this time.  He does continue to smoke at least a pack of cigarettes per day.  I've scheduled for followup endoscopy at his convenience and we will repeat testing for H. pylori.  His continue other medications as listed and reviewed.  It is of note that this patient also had an NSAID induced gastric ulceration in June of 2011 had anemia and iron deficiency anemia.  Biopsies at that time also were negative for H. pylori..  Previous colonoscopy in June of 2011 showed only hyperplastic polyps.  He also suffers from ADD.  He also has a history of mild sleep apnea and CPAP machine usage.

## 2013-10-02 NOTE — Assessment & Plan Note (Signed)
F/U with Dr Nelva Bush

## 2013-10-02 NOTE — Patient Instructions (Signed)

## 2013-10-02 NOTE — Assessment & Plan Note (Signed)
Fasting NMR Lipoprofile

## 2013-10-02 NOTE — Progress Notes (Signed)
   Subjective:    Patient ID: Martin Archer, male    DOB: 09-22-1960, 53 y.o.   MRN: [id number]  HPI Blood pressure not monitored.  Compliant with anti hypertemsive medication. No lightheadedness or other adverse medication effect described.      A heart healthy diet is followed;no  exercise due to LBP.  Family history is positive for premature coronary disease. Advanced cholesterol testing invalid due to TG > 700. The statin was D/ced after improved lipids..  Low dose ASA not taken due to ulcer history.   Review of Systems Specifically denied are epistaxis , headache,chest pain, palpitations, dyspnea, or claudication. Support hose foe worn for edema.     Objective:   Physical Exam Appears healthy and well-nourished & in no acute distress  L > R carotid bruits are present.Op scar on L. No neck pain distention present at 10 - 15 degrees. Thyroid normal to palpation  Heart rhythm and rate are normal with no significant murmurs or gallops.  Chest is clear with no increased work of breathing  There is no evidence of aortic aneurysm or renal artery bruits  Abdomen soft with no organomegaly or masses. No HJR  No clubbing, cyanosis or edema present.  Pedal pulses are decreased especially DPP  No ischemic skin changes are present . Nails healthy  Alert and oriented. Strength, tone, DTRs reflexes normal          Assessment & Plan:  See Current Assessment & Plan in Problem List under specific Diagnosis

## 2013-10-05 ENCOUNTER — Encounter: Payer: Self-pay | Admitting: Gastroenterology

## 2013-10-05 ENCOUNTER — Ambulatory Visit (AMBULATORY_SURGERY_CENTER): Payer: BC Managed Care – PPO | Admitting: Gastroenterology

## 2013-10-05 ENCOUNTER — Encounter: Payer: Self-pay | Admitting: *Deleted

## 2013-10-05 ENCOUNTER — Other Ambulatory Visit: Payer: Self-pay | Admitting: Internal Medicine

## 2013-10-05 ENCOUNTER — Other Ambulatory Visit: Payer: BC Managed Care – PPO

## 2013-10-05 VITALS — BP 115/89 | HR 75 | Temp 97.6°F | Resp 17 | Ht 68.0 in | Wt 196.0 lb

## 2013-10-05 DIAGNOSIS — K3189 Other diseases of stomach and duodenum: Principal | ICD-10-CM

## 2013-10-05 DIAGNOSIS — K254 Chronic or unspecified gastric ulcer with hemorrhage: Secondary | ICD-10-CM

## 2013-10-05 LAB — NMR LIPOPROFILE WITH LIPIDS

## 2013-10-05 MED ORDER — SODIUM CHLORIDE 0.9 % IV SOLN: 500.0000 mL | INTRAVENOUS | Status: DC

## 2013-10-05 NOTE — Op Note (Signed)
[facility]  [address] [place], [id number]   ENDOSCOPY PROCEDURE REPORT  PATIENT: [name], [name].  MR#: [id number] BIRTHDATE: [birth year] , 52  yrs. old GENDER: Male ENDOSCOPIST:David Consuello Masse, MD, [facility] REFERRED BY: PROCEDURE DATE:  10/05/2013 PROCEDURE:   EGD w/ biopsy for H.pylori ASA CLASS:    Class II INDICATIONS: F/U gastric ulcer. MEDICATION: propofol (Diprivan) 200mg  IV TOPICAL ANESTHETIC:  DESCRIPTION OF PROCEDURE:   After the risks and benefits of the procedure were explained, informed consent was obtained.  The LB [id number]  endoscope was introduced through the mouth  and advanced to the second portion of the duodenum .  The instrument was slowly withdrawn as the mucosa was fully examined.      DUODENUM: The duodenal mucosa showed no abnormalities in the bulb and second portion of the duodenum.  ESOPHAGUS: The mucosa of the esophagus appeared normal.  STOMACH: A small round and shallow ulcer, ranging between 3-5 mm in size, was found on the lesser curvature of the gastric antrum. Nothing atypical appearing to lesion . CLO Bx. of antrum done. Retroflexed views revealed no abnormalities.    The scope was then withdrawn from the patient and the procedure completed.  COMPLICATIONS: There were no complications.   ENDOSCOPIC IMPRESSION: 1.   The duodenal mucosa showed no abnormalities in the bulb and second portion of the duodenum 2.   The mucosa of the esophagus appeared normal 3.   Small ulcer, ranging between 3-5 mm in size, was found on the lesser curvature of the gastric antrum.Prior antral ulcer is healing,but has not completely healed.Marland KitchenMarland Kitchen?? continued NSAID use here.  RECOMMENDATIONS: 1.  Avoid NSAIDS for two weeks 2.  Continue PPI 3.  Rx CLO if positive 4. Office f/u Dr. Fuller Plan in 6 weeks and consider repeat endoscopy to assure healing with Hx. of prior GI bleed.      _______________________________ eSignedSable Feil, MD, [facility] 10/05/2013 3:38 PM   standard discharge   PATIENT NAME:  Martin Archer. MR#: [id number]

## 2013-10-05 NOTE — Progress Notes (Signed)
A/ox3 pleased with MAC, report to Suzanne RN 

## 2013-10-05 NOTE — Progress Notes (Signed)
Called to room to assist during endoscopic procedure.  Patient ID and intended procedure confirmed with present staff. Received instructions for my participation in the procedure from the performing physician.  

## 2013-10-05 NOTE — Patient Instructions (Signed)
YOU HAD AN ENDOSCOPIC PROCEDURE TODAY AT [facility]: Refer to the procedure report that was given to you for any specific questions about what was found during the examination.  If the procedure report does not answer your questions, please call your gastroenterologist to clarify.  If you requested that your care partner not be given the details of your procedure findings, then the procedure report has been included in a sealed envelope for you to review at your convenience later.  YOU SHOULD EXPECT: Some feelings of bloating in the abdomen. Passage of more gas than usual.  Walking can help get rid of the air that was put into your GI tract during the procedure and reduce the bloating. If you had a lower endoscopy (such as a colonoscopy or flexible sigmoidoscopy) you may notice spotting of blood in your stool or on the toilet paper. If you underwent a bowel prep for your procedure, then you may not have a normal bowel movement for a few days.  DIET: Your first meal following the procedure should be a light meal and then it is ok to progress to your normal diet.  A half-sandwich or bowl of soup is an example of a good first meal.  Heavy or fried foods are harder to digest and may make you feel nauseous or bloated.  Likewise meals heavy in dairy and vegetables can cause extra gas to form and this can also increase the bloating.  Drink plenty of fluids but you should avoid alcoholic beverages for 24 hours.  ACTIVITY: Your care partner should take you home directly after the procedure.  You should plan to take it easy, moving slowly for the rest of the day.  You can resume normal activity the day after the procedure however you should NOT DRIVE or use heavy machinery for 24 hours (because of the sedation medicines used during the test).    SYMPTOMS TO REPORT IMMEDIATELY: A gastroenterologist can be reached at any hour.  During normal business hours, 8:30 AM to 5:00 PM Monday through Friday,  call [phone].  After hours and on weekends, please call the GI answering service at [phone] who will take a message and have the physician on call contact you.  AVOID NSAIDS FOR TWO WEEKS. CONTINUE ANTI-REFLUX MEDICATION.  WE WILL CALL YOU IF YOU HAVE H PYLORI INFECTION.  OFFICE FOLLOW-UP IN ABOUT 6 WEEKS WITH DR. Fuller Plan FOR ANOTHER CHECK.   Following upper endoscopy (EGD)  Vomiting of blood or coffee ground material  New chest pain or pain under the shoulder blades  Painful or persistently difficult swallowing  New shortness of breath  Fever of 100F or higher  Black, tarry-looking stools  FOLLOW UP: If any biopsies were taken you will be contacted by phone or by letter within the next 1-3 weeks.  Call your gastroenterologist if you have not heard about the biopsies in 3 weeks.  Our staff will call the home number listed on your records the next business day following your procedure to check on you and address any questions or concerns that you may have at that time regarding the information given to you following your procedure. This is a courtesy call and so if there is no answer at the home number and we have not heard from you through the emergency physician on call, we will assume that you have returned to your regular daily activities without incident.  SIGNATURES/CONFIDENTIALITY: You and/or your care partner have signed paperwork which will  be entered into your electronic medical record.  These signatures attest to the fact that that the information above on your After Visit Summary has been reviewed and is understood.  Full responsibility of the confidentiality of this discharge information lies with you and/or your care-partner. 

## 2013-10-06 ENCOUNTER — Telehealth: Payer: Self-pay | Admitting: *Deleted

## 2013-10-06 LAB — NMR LIPOPROFILE WITH LIPIDS

## 2013-10-06 LAB — HELICOBACTER PYLORI SCREEN-BIOPSY: UREASE: NEGATIVE

## 2013-10-06 NOTE — Telephone Encounter (Signed)
No answer, message left for the patient. 

## 2013-10-06 NOTE — Telephone Encounter (Signed)
Received via fax from [facility] prior auth for patient to take Pantoprazole twice daily  I called [phone] and spoke with [place]  Per [place] prior Josem Kaufmann was approved for a year Approval number: [id number]

## 2013-10-07 ENCOUNTER — Encounter: Payer: Self-pay | Admitting: Gastroenterology

## 2013-10-08 ENCOUNTER — Other Ambulatory Visit: Payer: Self-pay | Admitting: Gastroenterology

## 2013-10-12 ENCOUNTER — Other Ambulatory Visit: Payer: Self-pay | Admitting: Internal Medicine

## 2013-10-14 ENCOUNTER — Encounter: Payer: Self-pay | Admitting: Internal Medicine

## 2013-10-14 LAB — NMR LIPOPROFILE WITH LIPIDS
Cholesterol, Total: 286 mg/dL — ABNORMAL HIGH (ref <200)
HDL Particle Number: 26.1 umol/L — ABNORMAL LOW (ref >=30.5)
HDL Size: 8.6 nm — ABNORMAL LOW (ref >=9.2)
HDL-C: 35 mg/dL — ABNORMAL LOW (ref >=40)
LARGE VLDL-P: 27.5 nmol/L — AB (ref <=2.7)
LDL Particle Number: 3146 nmol/L — ABNORMAL HIGH (ref <1000)
LDL SIZE: 19.5 nm — AB (ref >20.5)
LP-IR SCORE: 84 — AB (ref <=45)
Large HDL-P: <1.3 umol/L — ABNORMAL LOW (ref >=4.8)
Small LDL Particle Number: >2300 nmol/L — ABNORMAL HIGH (ref <=527)
Triglycerides: 572 mg/dL — ABNORMAL HIGH (ref <150)
VLDL SIZE: 51.8 nm — AB (ref <=46.6)

## 2013-10-15 ENCOUNTER — Other Ambulatory Visit: Payer: Self-pay | Admitting: Internal Medicine

## 2013-10-15 ENCOUNTER — Encounter: Payer: Self-pay | Admitting: Internal Medicine

## 2013-10-15 DIAGNOSIS — E782 Mixed hyperlipidemia: Secondary | ICD-10-CM

## 2013-10-15 MED ORDER — FENOFIBRATE 145 MG PO TABS: 145.0000 mg | ORAL_TABLET | Freq: Every day | ORAL | Status: DC

## 2013-10-16 ENCOUNTER — Telehealth: Payer: Self-pay | Admitting: *Deleted

## 2013-10-16 NOTE — Telephone Encounter (Signed)
Message copied by Harl Bowie on Fri Oct 16, 2013  1:49 PM ------      Message from: Hendricks Limes      Created: Tue Oct 06, 2013  6:06 PM       Please verify NMR pending ; reschedule if not done. Thanks, Hopp       ----- Message -----         From: [facility]         Sent: 10/06/2013   3:20 PM           To: Hendricks Limes, MD                   ------

## 2013-10-16 NOTE — Telephone Encounter (Signed)
Spoke with the pt and he stated he came and had the NMR on (10-12-13).//AB/CMA

## 2013-10-21 ENCOUNTER — Ambulatory Visit: Payer: BC Managed Care – PPO | Admitting: Internal Medicine

## 2013-11-06 ENCOUNTER — Encounter: Payer: Self-pay | Admitting: Internal Medicine

## 2013-11-06 ENCOUNTER — Other Ambulatory Visit: Payer: Self-pay | Admitting: Internal Medicine

## 2013-11-06 MED ORDER — METOPROLOL TARTRATE 25 MG PO TABS: ORAL_TABLET | ORAL | Status: DC

## 2013-11-09 ENCOUNTER — Other Ambulatory Visit: Payer: Self-pay | Admitting: Internal Medicine

## 2013-11-16 ENCOUNTER — Ambulatory Visit (INDEPENDENT_AMBULATORY_CARE_PROVIDER_SITE_OTHER): Payer: BC Managed Care – PPO | Admitting: Gastroenterology

## 2013-11-16 ENCOUNTER — Other Ambulatory Visit (INDEPENDENT_AMBULATORY_CARE_PROVIDER_SITE_OTHER): Payer: BC Managed Care – PPO

## 2013-11-16 ENCOUNTER — Encounter: Payer: Self-pay | Admitting: Gastroenterology

## 2013-11-16 VITALS — BP 116/70 | HR 80 | Ht 66.5 in | Wt 192.2 lb

## 2013-11-16 DIAGNOSIS — D649 Anemia, unspecified: Secondary | ICD-10-CM

## 2013-11-16 DIAGNOSIS — K253 Acute gastric ulcer without hemorrhage or perforation: Secondary | ICD-10-CM

## 2013-11-16 LAB — CBC WITH DIFFERENTIAL/PLATELET
BASOS ABS: 0 10*3/uL (ref 0.0–0.1)
BASOS PCT: 0.4 % (ref 0.0–3.0)
Eosinophils Absolute: 0 10*3/uL (ref 0.0–0.7)
Eosinophils Relative: 0 % (ref 0.0–5.0)
HCT: 41.5 % (ref 39.0–52.0)
HEMOGLOBIN: 14 g/dL (ref 13.0–17.0)
LYMPHS PCT: 24 % (ref 12.0–46.0)
Lymphs Abs: 2.3 10*3/uL (ref 0.7–4.0)
MCHC: 33.8 g/dL (ref 30.0–36.0)
MCV: 86.5 fl (ref 78.0–100.0)
Monocytes Absolute: 0.9 10*3/uL (ref 0.1–1.0)
Monocytes Relative: 9.5 % (ref 3.0–12.0)
Neutro Abs: 6.4 10*3/uL (ref 1.4–7.7)
Neutrophils Relative %: 66.1 % (ref 43.0–77.0)
Platelets: 336 10*3/uL (ref 150.0–400.0)
RBC: 4.8 Mil/uL (ref 4.22–5.81)
RDW: 15.5 % — ABNORMAL HIGH (ref 11.5–14.6)
WBC: 9.7 10*3/uL (ref 4.5–10.5)

## 2013-11-16 NOTE — Progress Notes (Signed)
    History of Present Illness: This is a 53 year old male previously followed by Dr. Sharlett Iles. He has a small healing antral ulcer noted on EGD in February. CLOtest was negative. Following a hospitalization with a GU and upper GI bleed in the fall his hemoglobin was 9.6. Off all aspirin, NSAID and Cox 2 agents. He is maintained on pantoprazole 40 mg twice a day. He has no GI complaints.  Current Medications, Allergies, Past Medical History, Past Surgical History, Family History and Social History were reviewed in [employer] record.  Physical Exam: General: Well developed , well nourished, no acute distress Head: Normocephalic and atraumatic Eyes:  sclerae anicteric, EOMI Ears: Normal auditory acuity Mouth: No deformity or lesions Lungs: Clear throughout to auscultation Heart: Regular rate and rhythm; no murmurs, rubs or bruits Abdomen: Soft, non tender and non distended. No masses, hepatosplenomegaly or hernias noted. Normal Bowel sounds Musculoskeletal: Symmetrical with no gross deformities  Pulses:  Normal pulses noted Extremities: No clubbing, cyanosis, edema or deformities noted Neurological: Alert oriented x 4, grossly nonfocal Psychological:  Alert and cooperative. Normal mood and affect  Assessment and Recommendations:  1. Gastric ulcer, healing. Continue to avoid ASA, NSAIDs and Cox 2 agents. Continue pantoprazole 40 mg twice a day. Followup endoscopy in 3 months.  2. Anemia, likely posthemorrhagic. Repeat CBC today.

## 2013-11-16 NOTE — Patient Instructions (Addendum)
You have been given a separate informational sheet regarding your tobacco use, the importance of quitting and local resources to help you quit.  Your physician has requested that you go to the basement for the following lab work before leaving today: CBC.  You will be due for a recall Endoscopy in 01/2014. We will send you a reminder in the mail when it gets closer to that time.  Thank you for choosing me and [facility].  Pricilla Riffle. Dagoberto Ligas., MD., Marval Regal

## 2014-01-27 ENCOUNTER — Other Ambulatory Visit: Payer: Self-pay

## 2014-01-27 DIAGNOSIS — E782 Mixed hyperlipidemia: Secondary | ICD-10-CM

## 2014-01-27 MED ORDER — FENOFIBRATE 145 MG PO TABS: 145.0000 mg | ORAL_TABLET | Freq: Every day | ORAL | Status: DC

## 2014-01-27 NOTE — Telephone Encounter (Signed)
rx refill request for fenofibrate 145 mg tab po daily qty 30 x 2 rfs sent to pharmacy

## 2014-02-23 ENCOUNTER — Other Ambulatory Visit: Payer: Self-pay

## 2014-02-23 MED ORDER — METOPROLOL TARTRATE 25 MG PO TABS: 25.0000 mg | ORAL_TABLET | Freq: Two times a day (BID) | ORAL | Status: DC

## 2014-05-31 ENCOUNTER — Other Ambulatory Visit: Payer: Self-pay

## 2014-05-31 DIAGNOSIS — E782 Mixed hyperlipidemia: Secondary | ICD-10-CM

## 2014-05-31 MED ORDER — FENOFIBRATE 145 MG PO TABS: 145.0000 mg | ORAL_TABLET | Freq: Every day | ORAL | Status: DC

## 2014-06-11 ENCOUNTER — Other Ambulatory Visit: Payer: Self-pay

## 2014-09-30 ENCOUNTER — Other Ambulatory Visit: Payer: Self-pay

## 2014-09-30 MED ORDER — METOPROLOL TARTRATE 25 MG PO TABS: 25.0000 mg | ORAL_TABLET | Freq: Two times a day (BID) | ORAL | Status: DC

## 2014-12-08 ENCOUNTER — Ambulatory Visit (INDEPENDENT_AMBULATORY_CARE_PROVIDER_SITE_OTHER): Payer: BLUE CROSS/BLUE SHIELD | Admitting: Internal Medicine

## 2014-12-08 ENCOUNTER — Other Ambulatory Visit (INDEPENDENT_AMBULATORY_CARE_PROVIDER_SITE_OTHER): Payer: BLUE CROSS/BLUE SHIELD

## 2014-12-08 ENCOUNTER — Encounter: Payer: Self-pay | Admitting: Internal Medicine

## 2014-12-08 VITALS — BP 198/130 | HR 80 | Temp 97.7°F | Resp 14 | Ht 67.0 in | Wt 198.2 lb

## 2014-12-08 DIAGNOSIS — E782 Mixed hyperlipidemia: Secondary | ICD-10-CM

## 2014-12-08 DIAGNOSIS — R202 Paresthesia of skin: Secondary | ICD-10-CM | POA: Diagnosis not present

## 2014-12-08 DIAGNOSIS — I10 Essential (primary) hypertension: Secondary | ICD-10-CM

## 2014-12-08 DIAGNOSIS — R0989 Other specified symptoms and signs involving the circulatory and respiratory systems: Secondary | ICD-10-CM

## 2014-12-08 LAB — BASIC METABOLIC PANEL
BUN: 23 mg/dL (ref 6–23)
CO2: 28 mEq/L (ref 19–32)
Calcium: 9.7 mg/dL (ref 8.4–10.5)
Chloride: 103 mEq/L (ref 96–112)
Creatinine, Ser: 1.47 mg/dL (ref 0.40–1.50)
GFR: 52.69 mL/min — AB (ref >60.00)
Glucose, Bld: 105 mg/dL — ABNORMAL HIGH (ref 70–99)
Potassium: 5.1 mEq/L (ref 3.5–5.1)
SODIUM: 138 meq/L (ref 135–145)

## 2014-12-08 LAB — LDL CHOLESTEROL, DIRECT: Direct LDL: 139 mg/dL

## 2014-12-08 LAB — TSH: TSH: 1.55 u[IU]/mL (ref 0.35–4.50)

## 2014-12-08 LAB — LIPID PANEL
Cholesterol: 270 mg/dL — ABNORMAL HIGH (ref 0–200)
HDL: 41.1 mg/dL (ref >39.00)
NONHDL: 228.9
TRIGLYCERIDES: 381 mg/dL — AB (ref 0.0–149.0)
Total CHOL/HDL Ratio: 7
VLDL: 76.2 mg/dL — AB (ref 0.0–40.0)

## 2014-12-08 LAB — HEPATIC FUNCTION PANEL
ALBUMIN: 4.4 g/dL (ref 3.5–5.2)
ALT: 20 U/L (ref 0–53)
AST: 22 U/L (ref 0–37)
Alkaline Phosphatase: 76 U/L (ref 39–117)
Bilirubin, Direct: 0.1 mg/dL (ref 0.0–0.3)
Total Bilirubin: 0.5 mg/dL (ref 0.2–1.2)
Total Protein: 7.5 g/dL (ref 6.0–8.3)

## 2014-12-08 LAB — VITAMIN B12: VITAMIN B 12: 445 pg/mL (ref 211–911)

## 2014-12-08 LAB — HEMOGLOBIN A1C: Hgb A1c MFr Bld: 5.4 % (ref 4.6–6.5)

## 2014-12-08 MED ORDER — METOPROLOL TARTRATE 25 MG PO TABS: 25.0000 mg | ORAL_TABLET | Freq: Two times a day (BID) | ORAL | Status: DC

## 2014-12-08 MED ORDER — AMLODIPINE BESYLATE 5 MG PO TABS: 5.0000 mg | ORAL_TABLET | Freq: Every day | ORAL | Status: DC

## 2014-12-08 MED ORDER — LOSARTAN POTASSIUM 100 MG PO TABS: 100.0000 mg | ORAL_TABLET | Freq: Every day | ORAL | Status: DC

## 2014-12-08 NOTE — Assessment & Plan Note (Signed)
Doppler of carotids

## 2014-12-08 NOTE — Assessment & Plan Note (Signed)
Lipids, LFTs, TSH  

## 2014-12-08 NOTE — Assessment & Plan Note (Signed)
TSH,B12 level, RPR

## 2014-12-08 NOTE — Progress Notes (Signed)
Pre visit review using our clinic review tool, if applicable. No additional management support is needed unless otherwise documented below in the visit note. 

## 2014-12-08 NOTE — Assessment & Plan Note (Signed)
Blood pressure goals reviewed. BMET  Lopressor twice a day; add amlodipine and losartan. HCT not added as he is unsure of history of gout  Office visit follow-up 1 week

## 2014-12-08 NOTE — Progress Notes (Signed)
   Subjective:    Patient ID: Martin Archer, male    DOB: 21-Dec-1960, 54 y.o.   MRN: [id number]  HPI  He is here with complaint of numbness in his extremities. Numbness in the feet began 1-2 months ago. He describes a sensation as if they are "asleep". The episodes occur daily varying from minutes to an hour. He's had similar symptoms in his hands in the past week.  He also describes intermittent dizziness and lightheadedness associated with fatigue and muscle weakness. There is no associated cardio or neurologic prodrome. He denies anxiety per se but is under great deal of stress related to marital separation and a new job.  He does eat red meat but no fried foods. He does not add salt. He continues to smoke 15 cigarettes a day. He exercises playing tennis weekly w/o symptoms. He has 3 mixed drinks per night.  Blood pressure at home is in the 180s over 90s. He is only taking Lopressor once a day.  He did stop his fenofibrate. He continues his PPI.  His father had a stroke and died at 96. Both parents had hypertension. His father had diabetes as did 2 paternal uncles.  Review of Systems He denies fever, chills, sweats, change in weight. Chest pain, palpitations, tachycardia, exertional dyspnea, paroxysmal nocturnal dyspnea, claudication or edema are absent. Unexplained weight loss, abdominal pain, significant dyspepsia, dysphagia, melena, rectal bleeding, or persistently small caliber stools are denied.    Objective:   Physical Exam Pertinent or positive findings include: He has a beard and mustache.  He has markedly decreased hearing. He does have hearing aids but is not wearing them.  Arteriolar narrowing is present. He does not have any hemorrhages despite the profound hypertension.  A loud left carotid bruit is present.  Pedal pulses are slightly decreased but intact.  Sensation is intact in the hands and feet to light touch. Romberg negative. He exhibits la belle  indifference as BP related risks discussed.   General appearance :adequately nourished; in no distress. Eyes: No conjunctival inflammation or scleral icterus is present. Heart:  Normal rate and regular rhythm. S1 and S2 normal without gallop, murmur, click, rub or other extra sounds   Lungs:Chest clear to auscultation; no wheezes, rhonchi,rales ,or rubs present.No increased work of breathing.  Abdomen: bowel sounds normal, soft and non-tender without masses, organomegaly or hernias noted.  No guarding or rebound.  Vascular : all pulses equal ; no renal artery bruits present. Skin:Warm & dry.  Intact without suspicious lesions or rashes ; no tenting or jaundice  Lymphatic: No lymphadenopathy is noted about the head, neck, axilla Neuro: Strength, tone & DTRs normal.        Assessment & Plan:  See Current Assessment & Plan in Problem List under specific Diagnosis

## 2014-12-08 NOTE — Patient Instructions (Signed)
Minimal Blood Pressure Goal= AVERAGE < 140/90;  Ideal is an AVERAGE < 135/85. This AVERAGE should be calculated from @ least 5-7 BP readings taken @ different times of day on different days of week. You should not respond to isolated BP readings , but rather the AVERAGE for that week .Please bring your  blood pressure cuff to office visits to verify that it is reliable.It  can also be checked against the blood pressure device at the pharmacy. Finger or wrist cuffs are not dependable; an arm cuff is.   Office appointment in 1 week with BP readings  Lab results will be transmitted to you by My Chart   Critical results will be called.   Followup as needed for any active or acute issue. Please report any significant change in your symptoms.

## 2014-12-09 LAB — RPR

## 2014-12-15 ENCOUNTER — Encounter: Payer: Self-pay | Admitting: Internal Medicine

## 2014-12-15 ENCOUNTER — Ambulatory Visit (INDEPENDENT_AMBULATORY_CARE_PROVIDER_SITE_OTHER): Payer: BLUE CROSS/BLUE SHIELD | Admitting: Internal Medicine

## 2014-12-15 VITALS — BP 140/100 | HR 74 | Temp 98.4°F | Ht 67.0 in | Wt 196.0 lb

## 2014-12-15 DIAGNOSIS — R0989 Other specified symptoms and signs involving the circulatory and respiratory systems: Secondary | ICD-10-CM | POA: Diagnosis not present

## 2014-12-15 DIAGNOSIS — I10 Essential (primary) hypertension: Secondary | ICD-10-CM | POA: Diagnosis not present

## 2014-12-15 DIAGNOSIS — E782 Mixed hyperlipidemia: Secondary | ICD-10-CM | POA: Diagnosis not present

## 2014-12-15 NOTE — Progress Notes (Signed)
   Subjective:    Patient ID: Martin Archer, male    DOB: 06-Jan-1961, 54 y.o.   MRN: [id number]  HPI He returns for follow-up of his blood pressure. Last week it was 198/130. He is on metoprolol 25 g twice a day, losartan 100 mg daily and amlodipine 5 mg daily. He states his blood pressure is 160/110 on average at home. He did not bring in any data. He requested the results of his lab work. He had signed up for My Chart but had not employed this entity. I reviewed the data in detail and gave him a copy.  He denies any cardiovascular symptoms except some edema. He had not filled the fenofibrate sent to his pharmacy  Review of Systems  Significant headaches, epistaxis, chest pain, palpitations, exertional dyspnea, claudication, or paroxysmal nocturnal dyspnea absent. No GI symptoms , memory loss or myalgias      Objective:   Physical Exam Pertinent or positive findings include: He has a loud left carotid bruit. Rare premature auscultated. The dorsalis pedis pulses are slightly decreased.  There is a yellow discoloration over the ankle areas(he uses a tanning topical agent;"until I can get enough sun") La belle indifference when risks discussed.(Note : he has dx of ADD)  General appearance :adequately nourished; in no distress. Eyes: No conjunctival inflammation or scleral icterus is present. Oral exam:  Lips and gums are healthy appearing.There is no oropharyngeal erythema or exudate noted. Dental hygiene is good. Heart:  Normal rate and regular rhythm. S1 and S2 normal without gallop, murmur, click, or rub. Lungs:Chest clear to auscultation; no wheezes, rhonchi,rales ,or rubs present.No increased work of breathing.  Abdomen: bowel sounds normal, soft and non-tender without masses, organomegaly or hernias noted.  No guarding or rebound.  Vascular : all pulses equal  Skin:Warm & dry.  Intact without suspicious lesions or rashes ; no tenting or jaundice  Lymphatic: No  lymphadenopathy is noted about the head, neck, axilla Neuro: Strength, tone & DTRs normal.         Assessment & Plan:  #1 hypertension, dramatic improvement. Blood pressure goals and appropriate monitoring discussed. If blood pressure goal is not attained; the amlodipine will be increased to 10 mg daily. Note: BP of 140/100 was recorded 12/15/14 , not 12/16/14.  #2 dyslipidemia. Dietary interventions discussed

## 2014-12-15 NOTE — Patient Instructions (Addendum)
Minimal Blood Pressure Goal= AVERAGE < 140/90;  Ideal is an AVERAGE < 135/85. This AVERAGE should be calculated from @ least 5-7 BP readings taken @ different times of day on different days of week. You should not respond to isolated BP readings , but rather the AVERAGE for that week .Please bring your  blood pressure cuff to office visits to verify that it is reliable.It  can also be checked against the blood pressure device at the pharmacy. Finger or wrist cuffs are not dependable; an arm cuff is.  If blood pressure does not average less than 140/90 over the next month; increase amlodipine to 10 mg daily (2 of the 5 mg pills).  The most common cause of elevated triglycerides (TG) is the ingestion of sugar from high fructose corn syrup sources added to processed foods & drinks.  Eat a low-fat diet with lots of fruits and vegetables, up to 7-9 servings per day. Consume less than 40  Grams (preferably ZERO) of sugar per day from foods & drinks with High Fructose Corn Syrup (HFCS) sugar as #1,2,3 or # 4 on label.[employer], [employer] [place] [employer] do not carry products with HFCS.

## 2014-12-15 NOTE — Progress Notes (Signed)
Pre visit review using our clinic review tool, if applicable. No additional management support is needed unless otherwise documented below in the visit note. 

## 2015-01-07 ENCOUNTER — Other Ambulatory Visit: Payer: Self-pay | Admitting: Internal Medicine

## 2015-01-07 DIAGNOSIS — E782 Mixed hyperlipidemia: Secondary | ICD-10-CM

## 2015-01-07 DIAGNOSIS — R0989 Other specified symptoms and signs involving the circulatory and respiratory systems: Secondary | ICD-10-CM

## 2015-01-13 ENCOUNTER — Encounter (HOSPITAL_COMMUNITY): Payer: BLUE CROSS/BLUE SHIELD

## 2015-01-15 ENCOUNTER — Inpatient Hospital Stay (HOSPITAL_COMMUNITY): Payer: 59

## 2015-01-15 ENCOUNTER — Inpatient Hospital Stay (HOSPITAL_COMMUNITY)
Admission: EM | Admit: 2015-01-15 | Discharge: 2015-01-20 | DRG: 038 | Disposition: A | Discharge status: Home / Self Care | Payer: 59 | Attending: Vascular Surgery | Admitting: Vascular Surgery

## 2015-01-15 ENCOUNTER — Encounter (HOSPITAL_COMMUNITY): Payer: Self-pay | Admitting: Emergency Medicine

## 2015-01-15 DIAGNOSIS — E782 Mixed hyperlipidemia: Secondary | ICD-10-CM | POA: Diagnosis present

## 2015-01-15 DIAGNOSIS — I82C12 Acute embolism and thrombosis of left internal jugular vein: Secondary | ICD-10-CM | POA: Diagnosis present

## 2015-01-15 DIAGNOSIS — G459 Transient cerebral ischemic attack, unspecified: Secondary | ICD-10-CM

## 2015-01-15 DIAGNOSIS — I10 Essential (primary) hypertension: Secondary | ICD-10-CM | POA: Diagnosis present

## 2015-01-15 DIAGNOSIS — R0989 Other specified symptoms and signs involving the circulatory and respiratory systems: Secondary | ICD-10-CM

## 2015-01-15 DIAGNOSIS — I700 Atherosclerosis of aorta: Secondary | ICD-10-CM | POA: Diagnosis present

## 2015-01-15 DIAGNOSIS — I82C19 Acute embolism and thrombosis of unspecified internal jugular vein: Secondary | ICD-10-CM | POA: Diagnosis not present

## 2015-01-15 DIAGNOSIS — M1990 Unspecified osteoarthritis, unspecified site: Secondary | ICD-10-CM | POA: Diagnosis present

## 2015-01-15 DIAGNOSIS — G451 Carotid artery syndrome (hemispheric): Secondary | ICD-10-CM | POA: Diagnosis not present

## 2015-01-15 DIAGNOSIS — Z79891 Long term (current) use of opiate analgesic: Secondary | ICD-10-CM

## 2015-01-15 DIAGNOSIS — G4730 Sleep apnea, unspecified: Secondary | ICD-10-CM | POA: Diagnosis present

## 2015-01-15 DIAGNOSIS — I63311 Cerebral infarction due to thrombosis of right middle cerebral artery: Secondary | ICD-10-CM

## 2015-01-15 DIAGNOSIS — Z888 Allergy status to other drugs, medicaments and biological substances status: Secondary | ICD-10-CM | POA: Diagnosis not present

## 2015-01-15 DIAGNOSIS — Z79899 Other long term (current) drug therapy: Secondary | ICD-10-CM

## 2015-01-15 DIAGNOSIS — I6529 Occlusion and stenosis of unspecified carotid artery: Secondary | ICD-10-CM | POA: Diagnosis present

## 2015-01-15 DIAGNOSIS — I6523 Occlusion and stenosis of bilateral carotid arteries: Principal | ICD-10-CM | POA: Diagnosis present

## 2015-01-15 DIAGNOSIS — I82409 Acute embolism and thrombosis of unspecified deep veins of unspecified lower extremity: Secondary | ICD-10-CM | POA: Insufficient documentation

## 2015-01-15 DIAGNOSIS — I82890 Acute embolism and thrombosis of other specified veins: Secondary | ICD-10-CM

## 2015-01-15 DIAGNOSIS — Z6829 Body mass index (BMI) 29.0-29.9, adult: Secondary | ICD-10-CM

## 2015-01-15 DIAGNOSIS — M549 Dorsalgia, unspecified: Secondary | ICD-10-CM | POA: Diagnosis present

## 2015-01-15 DIAGNOSIS — Z823 Family history of stroke: Secondary | ICD-10-CM

## 2015-01-15 DIAGNOSIS — G8929 Other chronic pain: Secondary | ICD-10-CM | POA: Diagnosis present

## 2015-01-15 DIAGNOSIS — R4781 Slurred speech: Secondary | ICD-10-CM | POA: Diagnosis present

## 2015-01-15 DIAGNOSIS — I639 Cerebral infarction, unspecified: Secondary | ICD-10-CM | POA: Diagnosis present

## 2015-01-15 DIAGNOSIS — I6521 Occlusion and stenosis of right carotid artery: Secondary | ICD-10-CM | POA: Diagnosis not present

## 2015-01-15 DIAGNOSIS — R202 Paresthesia of skin: Secondary | ICD-10-CM

## 2015-01-15 DIAGNOSIS — F17210 Nicotine dependence, cigarettes, uncomplicated: Secondary | ICD-10-CM | POA: Diagnosis present

## 2015-01-15 DIAGNOSIS — R200 Anesthesia of skin: Secondary | ICD-10-CM | POA: Diagnosis present

## 2015-01-15 DIAGNOSIS — I8290 Acute embolism and thrombosis of unspecified vein: Secondary | ICD-10-CM | POA: Diagnosis not present

## 2015-01-15 DIAGNOSIS — I82403 Acute embolism and thrombosis of unspecified deep veins of lower extremity, bilateral: Secondary | ICD-10-CM | POA: Diagnosis not present

## 2015-01-15 DIAGNOSIS — I6789 Other cerebrovascular disease: Secondary | ICD-10-CM | POA: Diagnosis not present

## 2015-01-15 DIAGNOSIS — I720 Aneurysm of carotid artery: Secondary | ICD-10-CM | POA: Diagnosis present

## 2015-01-15 DIAGNOSIS — E669 Obesity, unspecified: Secondary | ICD-10-CM | POA: Diagnosis present

## 2015-01-15 LAB — CBC
HCT: 40.8 % (ref 39.0–52.0)
HEMOGLOBIN: 13.7 g/dL (ref 13.0–17.0)
MCH: 31.6 pg (ref 26.0–34.0)
MCHC: 33.6 g/dL (ref 30.0–36.0)
MCV: 94.2 fL (ref 78.0–100.0)
Platelets: 255 10*3/uL (ref 150–400)
RBC: 4.33 MIL/uL (ref 4.22–5.81)
RDW: 12.8 % (ref 11.5–15.5)
WBC: 9.2 10*3/uL (ref 4.0–10.5)

## 2015-01-15 LAB — DIFFERENTIAL
Basophils Absolute: 0 10*3/uL (ref 0.0–0.1)
Basophils Relative: 0 % (ref 0–1)
Eosinophils Absolute: 0 10*3/uL (ref 0.0–0.7)
Eosinophils Relative: 0 % (ref 0–5)
Lymphocytes Relative: 23 % (ref 12–46)
Lymphs Abs: 2.1 10*3/uL (ref 0.7–4.0)
MONOS PCT: 11 % (ref 3–12)
Monocytes Absolute: 1 10*3/uL (ref 0.1–1.0)
NEUTROS ABS: 6.1 10*3/uL (ref 1.7–7.7)
Neutrophils Relative %: 66 % (ref 43–77)

## 2015-01-15 LAB — URINALYSIS, ROUTINE W REFLEX MICROSCOPIC
Bilirubin Urine: NEGATIVE
Glucose, UA: 100 mg/dL — AB
HGB URINE DIPSTICK: NEGATIVE
KETONES UR: NEGATIVE mg/dL
Leukocytes, UA: NEGATIVE
NITRITE: NEGATIVE
Protein, ur: NEGATIVE mg/dL
SPECIFIC GRAVITY, URINE: 1.017 (ref 1.005–1.030)
Urobilinogen, UA: 0.2 mg/dL (ref 0.0–1.0)
pH: 5.5 (ref 5.0–8.0)

## 2015-01-15 LAB — I-STAT CHEM 8, ED
BUN: 27 mg/dL — ABNORMAL HIGH (ref 6–20)
Calcium, Ion: 1.18 mmol/L (ref 1.12–1.23)
Chloride: 106 mmol/L (ref 101–111)
Creatinine, Ser: 1.2 mg/dL (ref 0.61–1.24)
Glucose, Bld: 127 mg/dL — ABNORMAL HIGH (ref 65–99)
HCT: 42 % (ref 39.0–52.0)
Hemoglobin: 14.3 g/dL (ref 13.0–17.0)
Potassium: 4 mmol/L (ref 3.5–5.1)
SODIUM: 140 mmol/L (ref 135–145)
TCO2: 22 mmol/L (ref 0–100)

## 2015-01-15 LAB — RAPID URINE DRUG SCREEN, HOSP PERFORMED
Amphetamines: NOT DETECTED
Barbiturates: NOT DETECTED
Benzodiazepines: NOT DETECTED
Cocaine: NOT DETECTED
Opiates: POSITIVE — AB
TETRAHYDROCANNABINOL: POSITIVE — AB

## 2015-01-15 LAB — COMPREHENSIVE METABOLIC PANEL
ALK PHOS: 90 U/L (ref 38–126)
ALT: 25 U/L (ref 17–63)
ANION GAP: 11 (ref 5–15)
AST: 30 U/L (ref 15–41)
Albumin: 4.1 g/dL (ref 3.5–5.0)
BUN: 27 mg/dL — ABNORMAL HIGH (ref 6–20)
CO2: 25 mmol/L (ref 22–32)
Calcium: 9.5 mg/dL (ref 8.9–10.3)
Chloride: 103 mmol/L (ref 101–111)
Creatinine, Ser: 1.17 mg/dL (ref 0.61–1.24)
GFR calc non Af Amer: >60 mL/min (ref >60)
GLUCOSE: 127 mg/dL — AB (ref 65–99)
POTASSIUM: 4 mmol/L (ref 3.5–5.1)
SODIUM: 139 mmol/L (ref 135–145)
Total Bilirubin: 0.5 mg/dL (ref 0.3–1.2)
Total Protein: 7 g/dL (ref 6.5–8.1)

## 2015-01-15 LAB — I-STAT TROPONIN, ED: Troponin i, poc: 0 ng/mL (ref 0.00–0.08)

## 2015-01-15 LAB — APTT: aPTT: 23 seconds — ABNORMAL LOW (ref 24–37)

## 2015-01-15 LAB — ETHANOL

## 2015-01-15 LAB — PROTIME-INR
INR: 0.93 (ref 0.00–1.49)
PROTHROMBIN TIME: 12.7 s (ref 11.6–15.2)

## 2015-01-15 MED ORDER — HYDRALAZINE HCL 20 MG/ML IJ SOLN
5.0000 mg | Freq: Once | INTRAMUSCULAR | Status: AC
  Administered 2015-01-15: 5 mg via INTRAVENOUS
  Filled 2015-01-15: qty 1

## 2015-01-15 MED ORDER — ENOXAPARIN SODIUM 40 MG/0.4ML ~~LOC~~ SOLN
40.0000 mg | SUBCUTANEOUS | Status: DC
  Administered 2015-01-15: 40 mg via SUBCUTANEOUS
  Filled 2015-01-15: qty 0.4

## 2015-01-15 MED ORDER — ASPIRIN 325 MG PO TABS
325.0000 mg | ORAL_TABLET | Freq: Every day | ORAL | Status: DC
Start: 2015-01-15 — End: 2015-01-16
  Administered 2015-01-15 – 2015-01-16 (×2): 325 mg via ORAL
  Filled 2015-01-15 (×2): qty 1

## 2015-01-15 MED ORDER — ASPIRIN 300 MG RE SUPP: 300.0000 mg | Freq: Every day | RECTAL | Status: DC

## 2015-01-15 MED ORDER — FENOFIBRATE 160 MG PO TABS
160.0000 mg | ORAL_TABLET | Freq: Every day | ORAL | Status: DC
  Administered 2015-01-16 – 2015-01-20 (×4): 160 mg via ORAL
  Filled 2015-01-15 (×5): qty 1

## 2015-01-15 MED ORDER — PANTOPRAZOLE SODIUM 20 MG PO TBEC
20.0000 mg | DELAYED_RELEASE_TABLET | Freq: Every day | ORAL | Status: DC
  Administered 2015-01-16 – 2015-01-20 (×4): 20 mg via ORAL
  Filled 2015-01-15 (×7): qty 1

## 2015-01-15 MED ORDER — STROKE: EARLY STAGES OF RECOVERY BOOK
Freq: Once | Status: AC
  Administered 2015-01-15: 21:00:00
  Filled 2015-01-15: qty 1

## 2015-01-15 NOTE — ED Provider Notes (Signed)
CSN: [id number]     Arrival date & time 01/15/15  [id number] History   First MD Initiated Contact with Patient 01/15/15 [phone]     Chief Complaint  Patient presents with  . left sided numbness      (Consider location/radiation/quality/duration/timing/severity/associated sxs/prior Treatment) HPI   Martin Archer is a 54yo male PMH of hypertension, hyperlipidemia presenting today with strokelike symptoms. Patient states his symptoms began last night at 8 PM. He describes a facial droop and left-sided numbness. He states over the past month he's had these problems intermittently. He is also experienced some dizziness. It is also currently a smoker. Nothing has made his symptoms better or worse.  He denies other neurological symptoms.  He denies any recent fevers or infections. Patient has no further complaints.  10 Systems reviewed and are negative for acute change except as noted in the HPI.   Past Medical History  Diagnosis Date  . Gastric ulcer with hemorrhage and obstruction 2011    Dr [facility]  . Anemia, unspecified   . Personal history of colonic polyps 01/30/2010    hyperplastic rectal  . Diverticulosis of colon (without mention of hemorrhage)   . Sleep apnea     on CPAP; [facility]  . Hypertension   . Hyperlipemia   . Arthritis    Past Surgical History  Procedure Laterality Date  . Inguinal hernia repair  1963  . Wisdom tooth extraction    . Nasal fracture surgery    . Carotid endarterectomy  11/2006    left Dr Mamie Nick  . Esophagogastroduodenoscopy Left 06/17/2013    Procedure: ESOPHAGOGASTRODUODENOSCOPY (EGD);  Surgeon: Arta Silence, MD;  Location: [facility] ENDOSCOPY;  Service: Endoscopy;  Laterality: Left;   Family History  Problem Relation Age of Onset  . Diabetes Father   . Hypertension Father   . Stroke Father     in late 53s  . Heart attack Mother     in 9s  . Heart attack Brother 13  . Diabetes Paternal Grandfather   . Cancer Maternal  Grandfather     unknown type  . Heart attack Maternal Grandfather 76  . Colon cancer Neg Hx   . Esophageal cancer Neg Hx   . Stomach cancer Neg Hx   . Stroke Paternal Uncle     in late 21s   History  Substance Use Topics  . Smoking status: Current Every Day Smoker -- 1.00 packs/day    Types: Cigarettes  . Smokeless tobacco: Never Used     Comment: smoked 1976-2007 & 2009- present , up to 2 ppd, average > 1ppd  . Alcohol Use: No     Comment: currently at [facility] for treatment.     Review of Systems    Allergies  Adderall; Prozac; and Sertraline hcl  Home Medications   Prior to Admission medications   Medication Sig Start Date End Date Taking? Authorizing Provider  amLODipine (NORVASC) 5 MG tablet Take 1 tablet (5 mg total) by mouth daily. 12/08/14   Hendricks Limes, MD  fenofibrate (TRICOR) 145 MG tablet Take 1 tablet (145 mg total) by mouth daily. 05/31/14   Hendricks Limes, MD  losartan (COZAAR) 100 MG tablet Take 1 tablet (100 mg total) by mouth daily. 12/08/14   Hendricks Limes, MD  metoprolol tartrate (LOPRESSOR) 25 MG tablet Take 1 tablet (25 mg total) by mouth 2 (two) times daily. 12/08/14   Hendricks Limes, MD  Multiple Vitamins-Minerals (MULTIVITAL PO) Take  1 tablet by mouth daily. Adult multivitamin with folic acid    Historical Provider, MD  Omega-3 Fatty Acids (FISH OIL PO) Take 3 g by mouth daily.    Historical Provider, MD  thiamine (VITAMIN B-1) 100 MG tablet Take 100 mg by mouth daily.    Historical Provider, MD   BP 159/115 mmHg  Pulse 84  Temp(Src) 98.6 F (37 C) (Oral)  Resp 18  SpO2 94% Physical Exam  Constitutional: He is oriented to person, place, and time. Vital signs are normal. He appears well-developed and well-nourished.  Non-toxic appearance. He does not appear ill. No distress.  HENT:  Head: Normocephalic and atraumatic.  Nose: Nose normal.  Mouth/Throat: Oropharynx is clear and moist. No oropharyngeal exudate.  Eyes: Conjunctivae  and EOM are normal. Pupils are equal, round, and reactive to light. No scleral icterus.  Neck: Normal range of motion. Neck supple. No tracheal deviation, no edema, no erythema and normal range of motion present. No thyroid mass and no thyromegaly present.  Cardiovascular: Normal rate, regular rhythm, S1 normal, S2 normal, normal heart sounds, intact distal pulses and normal pulses.  Exam reveals no gallop and no friction rub.   No murmur heard. Pulses:      Radial pulses are 2+ on the right side, and 2+ on the left side.       Dorsalis pedis pulses are 2+ on the right side, and 2+ on the left side.  Pulmonary/Chest: Effort normal and breath sounds normal. No respiratory distress. He has no wheezes. He has no rhonchi. He has no rales.  Abdominal: Soft. Normal appearance and bowel sounds are normal. He exhibits no distension, no ascites and no mass. There is no hepatosplenomegaly. There is no tenderness. There is no rebound, no guarding and no CVA tenderness.  Musculoskeletal: Normal range of motion. He exhibits no edema or tenderness.  Lymphadenopathy:    He has no cervical adenopathy.  Neurological: He is alert and oriented to person, place, and time. He has normal strength. No cranial nerve deficit or sensory deficit. He exhibits normal muscle tone.  Left upper and lower extremity is 3 out of 5 strength. Right upper and lower extremities 5 out of 5 strength. There is decreased sensation on the left side.  Skin: Skin is warm, dry and intact. No petechiae and no rash noted. He is not diaphoretic. No erythema. No pallor.  Psychiatric: He has a normal mood and affect. His behavior is normal. Judgment normal.  Nursing note and vitals reviewed.   ED Course  Procedures (including critical care time) Labs Review Labs Reviewed  APTT - Abnormal; Notable for the following:    aPTT 23 (*)    All other components within normal limits  COMPREHENSIVE METABOLIC PANEL - Abnormal; Notable for the  following:    Glucose, Bld 127 (*)    BUN 27 (*)    All other components within normal limits  URINE RAPID DRUG SCREEN (HOSP PERFORMED) - Abnormal; Notable for the following:    Opiates POSITIVE (*)    Tetrahydrocannabinol POSITIVE (*)    All other components within normal limits  URINALYSIS, ROUTINE W REFLEX MICROSCOPIC - Abnormal; Notable for the following:    Glucose, UA 100 (*)    All other components within normal limits  I-STAT CHEM 8, ED - Abnormal; Notable for the following:    BUN 27 (*)    Glucose, Bld 127 (*)    All other components within normal limits  ETHANOL  PROTIME-INR  CBC  DIFFERENTIAL  I-STAT TROPOININ, ED    Imaging Review Ct Head Wo Contrast  01/15/2015   CLINICAL DATA:  Acute onset of left-sided weakness and numbness approximately 12 hr ago, subsequently resolved. Patient states that the symptoms of occurred intermittently over the past several months.  EXAM: CT HEAD WITHOUT CONTRAST  TECHNIQUE: Contiguous axial images were obtained from the base of the skull through the vertex without intravenous contrast.  COMPARISON:  11/18/2006.  FINDINGS: Ventricular system normal in size and appearance for age. No significant atrophy. Since the prior examination in 2008, the patient has developed low-attenuation within the pons and midbrain, and possibly also in the medulla. No evidence of generalized white matter changes. No new brain parenchymal abnormalities elsewhere. No acute hemorrhage or hematoma. No extra-axial fluid collections. No visible mass lesion. No evidence of acute stroke.  No skull fracture or other focal osseous abnormality involving the skull. Visualized paranasal sinuses, bilateral mastoid air cells and bilateral middle ear cavities well-aerated. Mild bilateral carotid siphon atherosclerosis.  IMPRESSION: 1. Possible ischemic changes in the pons, midbrain, and medulla without associated hemorrhage. 2. Age advanced mild bilateral carotid siphon atherosclerosis.  MRI of the brain without contrast may be helpful in further evaluation to confirm or deny the above findings.   Electronically Signed   By: Evangeline Dakin M.D.   On: 01/15/2015 08:13   [name]  01/15/2015   CLINICAL DATA:  Stroke.  Abnormal CT head  EXAM: MRI HEAD WITHOUT CONTRAST  MRA HEAD WITHOUT CONTRAST  TECHNIQUE: Multiplanar, multiecho pulse sequences of the brain and surrounding structures were obtained without intravenous contrast. Angiographic images of the head were obtained using MRA technique without contrast.  COMPARISON:  CT head 01/15/2015  FINDINGS: MRI HEAD FINDINGS  Negative for acute infarct. CT abnormalities in the brainstem are felt to be artifact on CT as this area appears normal on MRI. This is compatible with beam hardening artifact on CT.  Ventricle size normal.  Cerebral volume normal.  Pituitary not enlarged. Craniocervical junction normal. Paranasal sinuses are clear. Mastoid sinuses are clear.  Negative for acute or chronic ischemia. Negative for demyelinating disease.  Hyperintense signal in the left internal carotid artery compatible with occlusion.  Negative for intracranial hemorrhage.  No mass or edema identified.  MRA HEAD FINDINGS  Decreased signal distal left vertebral artery which is likely due to atherosclerotic disease and moderate stenosis. Distal right vertebral artery patent to the basilar. AICA patent bilaterally. Basilar widely patent. Superior cerebellar and posterior cerebral arteries patent bilaterally.  Right cavernous carotid shows mild atherosclerotic disease without significant stenosis. 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 Left anterior and middle cerebral arteries are patent and supplied via the anterior communicating artery and a small left posterior communicating artery which is patent. Right posterior communicating artery also patent   Negative for cerebral aneurysm.  IMPRESSION: Negative for acute or chronic ischemic change. CT finding in the brainstem is an artifact.  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   Electronically Signed   By: Franchot Gallo M.D.   On: 01/15/2015 12:42   Mr Jodene Nam Head/brain Wo Cm  01/15/2015   CLINICAL DATA:  Stroke.  Abnormal CT head  EXAM: MRI HEAD WITHOUT CONTRAST  MRA HEAD WITHOUT CONTRAST  TECHNIQUE: Multiplanar, multiecho pulse sequences of the brain and surrounding structures were obtained without intravenous  contrast. Angiographic images of the head were obtained using MRA technique without contrast.  COMPARISON:  CT head 01/15/2015  FINDINGS: MRI HEAD FINDINGS  Negative for acute infarct. CT abnormalities in the brainstem are felt to be artifact on CT as this area appears normal on MRI. This is compatible with beam hardening artifact on CT.  Ventricle size normal.  Cerebral volume normal.  Pituitary not enlarged. Craniocervical junction normal. Paranasal sinuses are clear. Mastoid sinuses are clear.  Negative for acute or chronic ischemia. Negative for demyelinating disease.  Hyperintense signal in the left internal carotid artery compatible with occlusion.  Negative for intracranial hemorrhage.  No mass or edema identified.  MRA HEAD FINDINGS  Decreased signal distal left vertebral artery which is likely due to atherosclerotic disease and moderate stenosis. Distal right vertebral artery patent to the basilar. AICA patent bilaterally. Basilar widely patent. Superior cerebellar and posterior cerebral arteries patent bilaterally.  Right cavernous carotid shows mild atherosclerotic disease without significant stenosis. 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 Left anterior and middle cerebral arteries are patent and supplied via the  anterior communicating artery and a small left posterior communicating artery which is patent. Right posterior communicating artery also patent  Negative for cerebral aneurysm.  IMPRESSION: Negative for acute or chronic ischemic change. CT finding in the brainstem is an artifact.  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   Electronically Signed   By: Franchot Gallo M.D.   On: 01/15/2015 12:42     EKG Interpretation   Date/Time:  Saturday Jan 15 2015 06:47:57 EDT Ventricular Rate:  83 PR Interval:  135 QRS Duration: 106 QT Interval:  381 QTC Calculation: 448 R Axis:   15 Text Interpretation:  Sinus rhythm Probable left atrial enlargement No  significant change since last tracing Confirmed by Glynn Octave  [phone]) on 01/15/2015 6:52:35 AM      MDM   Final diagnoses:  None   Patient presents to the ED for facial droop and left-sided numbness. His symptoms seem to be minor. Facial droop has resolved in the emergency department. He continues to be weak on the left side. Stroke workup has been ordered, neurology was paged.  Patient symptoms began last night at 8pm, he is well outside the window for tpa.  Neurology recommends for the patient to be transferred to [facility] and admitted to [facility]ist for continued management.  CT scan does not show any acute bleed, but reveals possible ischemic changes.   Everlene Balls, MD 01/15/15 1530

## 2015-01-15 NOTE — ED Notes (Signed)
Pt transported to CT at present time. 

## 2015-01-15 NOTE — ED Notes (Signed)
Pt from home complains of left sided numbness and facial droop since last night around 2000. Hx HTN.  Pt alert and oriented. Over the past month he has experienced dizziness.

## 2015-01-15 NOTE — H&P (Addendum)
PCP:   Unice Cobble, MD   Chief Complaint:  Numbness of left arm and leg  HPI:  54 year old male who  has a past medical history of Gastric ulcer with hemorrhage and obstruction (2011); Anemia, unspecified; Personal history of colonic polyps (01/30/2010); Diverticulosis of colon (without mention of hemorrhage); Sleep apnea; Hypertension; Hyperlipemia; and Arthritis. Today came to the hospital with chief complaint of numbness of left arm and leg, patient also had left-sided facial droop last night. But did not come to the hospital as he thought it would get better for its own. As per patient he has been having these episodes for past few months where he would get weakness of the right side and numbness of left side. But these episodes would usually last for 30 minutes or so and then resolve. He did not seek medical attention for these. Patient has a history of left carotid endarterectomy 8 years ago. He does not take any aspirin. At this time patient's symptoms have resolved except some numbness in the left arm and leg. He also had slurred speech last night also complains of blurred vision when these episodes occur. He denies seizure-like activity, denies passing out, no nausea vomiting or diarrhea. No chest pain or shortness of breath. No fever dysuria urgency or frequency of urination. In the ED, ED physician discussed with the neurologist who recommended the patient was transferred to [facility] for further stroke workup.  Allergies:   Allergies  Allergen Reactions  . Adderall [Amphetamine-Dextroamphet Er]     diarrhea  . Prozac [Fluoxetine Hcl]     diarrhea  . Sertraline Hcl     REACTION: diarrhea      Past Medical History  Diagnosis Date  . Gastric ulcer with hemorrhage and obstruction 2011    Dr [facility]  . Anemia, unspecified   . Personal history of colonic polyps 01/30/2010    hyperplastic rectal  . Diverticulosis of colon (without mention of hemorrhage)    . Sleep apnea     on CPAP; [facility]  . Hypertension   . Hyperlipemia   . Arthritis     Past Surgical History  Procedure Laterality Date  . Inguinal hernia repair  1963  . Wisdom tooth extraction    . Nasal fracture surgery    . Carotid endarterectomy  11/2006    left Dr Mamie Nick  . Esophagogastroduodenoscopy Left 06/17/2013    Procedure: ESOPHAGOGASTRODUODENOSCOPY (EGD);  Surgeon: Arta Silence, MD;  Location: [facility] ENDOSCOPY;  Service: Endoscopy;  Laterality: Left;    Prior to Admission medications   Medication Sig Start Date End Date Taking? Authorizing Provider  amLODipine (NORVASC) 5 MG tablet Take 1 tablet (5 mg total) by mouth daily. 12/08/14   Hendricks Limes, MD  fenofibrate (TRICOR) 145 MG tablet Take 1 tablet (145 mg total) by mouth daily. 05/31/14   Hendricks Limes, MD  HYDROcodone-acetaminophen (NORCO/VICODIN) 5-325 MG per tablet  01/07/15   Historical Provider, MD  losartan (COZAAR) 100 MG tablet Take 1 tablet (100 mg total) by mouth daily. 12/08/14   Hendricks Limes, MD  metoprolol tartrate (LOPRESSOR) 25 MG tablet Take 1 tablet (25 mg total) by mouth 2 (two) times daily. 12/08/14   Hendricks Limes, MD  Multiple Vitamins-Minerals (MULTIVITAL PO) Take 1 tablet by mouth daily. Adult multivitamin with folic acid    Historical Provider, MD  naproxen (NAPROSYN) 500 MG tablet  01/07/15   Historical Provider, MD  Omega-3 Fatty Acids ([place]  OIL PO) Take 3 g by mouth daily.    Historical Provider, MD  thiamine (VITAMIN B-1) 100 MG tablet Take 100 mg by mouth daily.    Historical Provider, MD    Social History:  reports that he has been smoking Cigarettes.  He has been smoking about 1.00 pack per day. He has never used smokeless tobacco. He reports that he does not drink alcohol or use illicit drugs.  Family History  Problem Relation Age of Onset  . Diabetes Father   . Hypertension Father   . Stroke Father     in late 23s  . Heart attack Mother     in 55s  .  Heart attack Brother 20  . Diabetes Paternal Grandfather   . Cancer Maternal Grandfather     unknown type  . Heart attack Maternal Grandfather 76  . Colon cancer Neg Hx   . Esophageal cancer Neg Hx   . Stomach cancer Neg Hx   . Stroke Paternal Uncle     in late 4s     All the positives are listed in BOLD  Review of Systems:  HEENT: Headache, blurred vision, runny nose, sore throat Neck: Hypothyroidism, hyperthyroidism,,lymphadenopathy Chest : Shortness of breath, history of COPD, Asthma Heart : Chest pain, history of coronary arterey disease, hypertension GI:  Nausea, vomiting, diarrhea, constipation, GERD GU: Dysuria, urgency, frequency of urination, hematuria Neuro: Stroke, seizures, syncope, TIA Psych: Depression, anxiety, hallucinations   Physical Exam: Blood pressure 146/108, pulse 74, temperature 98.6 F (37 C), temperature source Oral, resp. rate 19, SpO2 96 %. Constitutional:   Patient is a well-developed and well-nourished male* in no acute distress and cooperative with exam. Head: Normocephalic and atraumatic Mouth: Mucus membranes moist Eyes: PERRL, EOMI, conjunctivae normal Neck: Supple, No Thyromegaly Cardiovascular: RRR, S1 normal, S2 normal Pulmonary/Chest: CTAB, no wheezes, rales, or rhonchi Abdominal: Soft. Non-tender, non-distended, bowel sounds are normal, no masses, organomegaly, or guarding present.   Neurological:  Mental Status:  Alert. Speech normal, no dysarthria. Able to follow 3 step commands. Oriented x 3 Cranial Nerves:  II:  Visual fields grossly intact.  III/IV/VI:  Extraocular movements intact. Pupils reactive bilaterally. No ptosis V/VII:  No  facial droop. facial light touch sensation normal bilaterally.  VIII:  Grossly intact.  IX/X:  Grossly normal gag.  XI:  Grossly normal  XII:  Midline tongue extension normal.  Motor:  RUE  5/5  LUE  5/5.  5/5 RLE and 5-/5 LLE  Sensory:  Light touch intact throughout on right, slight  decrease in sensory touch on left DTRs:  3+ in the upper extremities, 3+ in lower extremities  Extremities : No Cyanosis, Clubbing or Edema  Labs on Admission:  Basic Metabolic Panel:  Recent Labs Lab 01/15/15 0651 01/15/15 0657  NA 139 140  K 4.0 4.0  CL 103 106  CO2 25  --   GLUCOSE 127* 127*  BUN 27* 27*  CREATININE 1.17 1.20  CALCIUM 9.5  --    Liver Function Tests:  Recent Labs Lab 01/15/15 0651  AST 30  ALT 25  ALKPHOS 90  BILITOT 0.5  PROT 7.0  ALBUMIN 4.1   No results for input(s): LIPASE, AMYLASE in the last 168 hours. No results for input(s): AMMONIA in the last 168 hours. CBC:  Recent Labs Lab 01/15/15 0651 01/15/15 0657  WBC 9.2  --   NEUTROABS 6.1  --   HGB 13.7 14.3  HCT 40.8 42.0  MCV 94.2  --  PLT 255  --      EKG: Independently reviewed. Sinus rhythm   Assessment/Plan Active Problems:   Mixed hyperlipidemia   Essential hypertension   Numbness and tingling of left arm and leg  Numbness of left arm and leg Patient still has mild numbness of left arm and leg, will obtain CT head to rule out underlying stroke. Also transfer the patient to [facility] for further stroke workup. Will start aspirin, check lipid profile, MRI/MRA brain, echo, carotid Dopplers. PT OT consultation.  Hypertension Will hold anti-hypertensive medications currently patient takes amlodipine, metoprolol, Cozaar for permissive hypertension.  Hyperlipidemia Continue fenofibrate. Check lipid profile.  Patient will be transferred to [facility], Dr Eulogio Bear has accepted the patient.  Code status: Full code  Family discussion: Admission, patients condition and plan of care including tests being ordered have been discussed with the patient and his wife at bedside* who indicate understanding and agree with the plan and Code Status.   Time Spent on Admission: 60 minutes  [facility] Pager: [phone] 01/15/2015, 7:46 AM  If 7PM-7AM,  please contact night-coverage  [email or web address]  Password [email or web address]

## 2015-01-15 NOTE — ED Notes (Signed)
Pt reports L sided numbness, weakness onset at 2000 last night. Pt denies symptoms at present time but reports discomfort in L side face. No numbness present upon examination. Pt continues to report the same intermittent symptoms for the past few months. Pt in NAD at this time. Will continue to monitor

## 2015-01-15 NOTE — ED Notes (Signed)
Pt returned from CT °

## 2015-01-15 NOTE — Progress Notes (Signed)
VASCULAR LAB PRELIMINARY  PRELIMINARY  PRELIMINARY  PRELIMINARY  Carotid duplex completed.    Preliminary report:  Right > 80% ICA stenosis at the level of the distal bulb into the proximal ICA. Left - There appears to be an occlusion of the common carotid and Internal carotid artery. The right vertebral artery is antegrade and the left carotid artery has a " bunny sign " Consistent with a more proximal stenosis.  Martin Archer, RVS 01/15/2015, 1:33 PM

## 2015-01-15 NOTE — Consult Note (Signed)
Referring Physician: ED    Chief Complaint: left sided numbness-weakness  HPI:                                                                                                                                         Martin Archer is an 54 y.o. male with a past medical history significant for HTN, hyperlipidemia, s/p left CEA, OSA, gastric ulcer with hemorrhage and obstruction, diverticulosis of colon, transferred to [facility] for further evaluation of the above stated symptoms. He initially presented to [facility] with complains of numbness-weakness of the left face-arm-leg last night and upon awakening today.  Patient indicated that last night he noticed a numb sensation of his left face-arm-leg accompanied by weakness of arm and leg that lasted a couple of hours and subsided. He said that his sister did notice some droopiness of the left face. Did not seek immediate medical attention, but became concerned this morning around 4 am when the symptoms came back again. Said that the weakness is gone but still having some numbness of the left arm and left foot. It is worth mentioning that he reports 5 or 6 similar episodes within the last couple of months. Complains of a mild HA and blurriness of vision, but denies vertigo, double vision, difficulty swallowing, slurred speech, imbalance, language impairment of confusion. CT brain was personally reviewed and showed no acute abnormality. Date last known well: 01/14/15 Time last known well: unable to determine tPA Given: no, symptoms resolved   Past Medical History  Diagnosis Date  . Gastric ulcer with hemorrhage and obstruction 2011    Dr Fidel[facility]  . Anemia, unspecified   . Personal history of colonic polyps 01/30/2010    hyperplastic rectal  . Diverticulosis of colon (without mention of hemorrhage)   . Sleep apnea     on CPAP; [facility]  . Hypertension   . Hyperlipemia   . Arthritis     Past Surgical History  Procedure  Laterality Date  . Inguinal hernia repair  1963  . Wisdom tooth extraction    . Nasal fracture surgery    . Carotid endarterectomy  11/2006    left Dr Mamie Nick  . Esophagogastroduodenoscopy Left 06/17/2013    Procedure: ESOPHAGOGASTRODUODENOSCOPY (EGD);  Surgeon: Arta Silence, MD;  Location[facility];  Service: Endoscopy;  Laterality: Left;    Family History  Problem Relation Age of Onset  . Diabetes Father   . Hypertension Father   . Stroke Father     in late 13s  . Heart attack Mother     in 62s  . Heart attack Brother 97  . Diabetes Paternal Grandfather   . Cancer Maternal Grandfather     unknown type  . Heart attack Maternal Grandfather 76  . Colon cancer Neg Hx   . Esophageal cancer Neg Hx   . Stomach cancer Neg Hx   .  Stroke Paternal Uncle     in late 76s   Social History:  reports that he has been smoking Cigarettes.  He has been smoking about 1.00 pack per day. He has never used smokeless tobacco. He reports that he does not drink alcohol or use illicit drugs. Family history: no epilepsy, brain tumors, or brain aneurysms. Allergies:  Allergies  Allergen Reactions  . Adderall [Amphetamine-Dextroamphet Er]     diarrhea  . Prozac [Fluoxetine Hcl]     diarrhea  . Sertraline Hcl     REACTION: diarrhea    Medications:                                                                                                                           Scheduled: .  stroke: mapping our early stages of recovery book   Does not apply Once  . aspirin  300 mg Rectal Daily   Or  . aspirin  325 mg Oral Daily  . enoxaparin (LOVENOX) injection  40 mg Subcutaneous Q24H  . fenofibrate  160 mg Oral Daily    ROS:                                                                                                                                       History obtained from chart review and the patient  General ROS: negative for - chills, fatigue, fever, night sweats, weight gain or  weight loss Psychological ROS: negative for - behavioral disorder, hallucinations, memory difficulties, mood swings or suicidal ideation Ophthalmic ROS: negative for - double vision, eye pain or loss of vision ENT ROS: negative for - epistaxis, nasal discharge, oral lesions, sore throat, tinnitus or vertigo Allergy and Immunology ROS: negative for - hives or itchy/watery eyes Hematological and Lymphatic ROS: negative for - bleeding problems, bruising or swollen lymph nodes Endocrine ROS: negative for - galactorrhea, hair pattern changes, polydipsia/polyuria or temperature intolerance Respiratory ROS: negative for - cough, hemoptysis, shortness of breath or wheezing Cardiovascular ROS: negative for - chest pain, dyspnea on exertion, edema or irregular heartbeat Gastrointestinal ROS: negative for - abdominal pain, diarrhea, hematemesis, nausea/vomiting or stool incontinence Genito-Urinary ROS: negative for - dysuria, hematuria, incontinence or urinary frequency/urgency Musculoskeletal ROS: negative for - joint swelling or muscular weakness Neurological ROS: as noted in HPI Dermatological ROS: negative for rash and skin lesion changes   Physical exam: pleasant male  in no apparent distress. Blood pressure 151/91, pulse 67, temperature 98.2 F (36.8 C), temperature source Oral, resp. rate 18, SpO2 96 %. Head: normocephalic. Neck: supple, no bruits, no JVD. Cardiac: no murmurs. Lungs: clear. Abdomen: soft, no tender, no mass. Extremities: no edema. CV: pulses palpable throughout  Skin: no rash Neurologic Examination:                                                                                                      General: Mental Status: Alert, oriented, thought content appropriate.  Speech fluent without evidence of aphasia.  Able to follow 3 step commands without difficulty. Cranial Nerves: II: Discs flat bilaterally; Visual fields grossly normal, pupils equal, round, reactive to light  and accommodation III,IV, VI: ptosis not present, extra-ocular motions intact bilaterally V,VII: smile symmetric, facial light touch sensation normal bilaterally VIII: hearing normal bilaterally IX,X: uvula rises symmetrically XI: bilateral shoulder shrug XII: midline tongue extension without atrophy or fasciculations  Motor: Right : Upper extremity   5/5    Left:     Upper extremity   5/5  Lower extremity   5/5     Lower extremity   5/5 Tone and bulk:normal tone throughout; no atrophy noted Sensory: Pinprick and light touch slightly decreased left arm-foot Deep Tendon Reflexes:  Right: Upper Extremity   Left: Upper extremity   biceps (C-5 to C-6) 2/4   biceps (C-5 to C-6) 2/4 tricep (C7) 2/4    triceps (C7) 2/4 Brachioradialis (C6) 2/4  Brachioradialis (C6) 2/4  Lower Extremity Lower Extremity  quadriceps (L-2 to L-4) 2/4   quadriceps (L-2 to L-4) 2/4 Achilles (S1) 2/4   Achilles (S1) 2/4  Plantars: Right: downgoing   Left: downgoing Cerebellar: normal finger-to-nose,  normal heel-to-shin test Gait:  No tested due to multiple leads.      Results for orders placed or performed during the hospital encounter of 01/15/15 (from the past 48 hour(s))  Ethanol     Status: None   Collection Time: 01/15/15  6:51 AM  Result Value Ref Range   Alcohol, Ethyl (B) <5 <5 mg/dL    Comment:        LOWEST DETECTABLE LIMIT FOR SERUM ALCOHOL IS 11 mg/dL FOR MEDICAL PURPOSES ONLY   Protime-INR     Status: None   Collection Time: 01/15/15  6:51 AM  Result Value Ref Range   Prothrombin Time 12.7 11.6 - 15.2 seconds   INR 0.93 0.00 - 1.49  APTT     Status: Abnormal   Collection Time: 01/15/15  6:51 AM  Result Value Ref Range   aPTT 23 (L) 24 - 37 seconds  CBC     Status: None   Collection Time: 01/15/15  6:51 AM  Result Value Ref Range   WBC 9.2 4.0 - 10.5 K/uL   RBC 4.33 4.22 - 5.81 MIL/uL   Hemoglobin 13.7 13.0 - 17.0 g/dL   HCT 40.8 39.0 - 52.0 %   MCV 94.2 78.0 - 100.0 fL    MCH 31.6 26.0 - 34.0 pg   MCHC 33.6 30.0 -  36.0 g/dL   RDW 12.8 11.5 - 15.5 %   Platelets 255 150 - 400 K/uL  Differential     Status: None   Collection Time: 01/15/15  6:51 AM  Result Value Ref Range   Neutrophils Relative % 66 43 - 77 %   Neutro Abs 6.1 1.7 - 7.7 K/uL   Lymphocytes Relative 23 12 - 46 %   Lymphs Abs 2.1 0.7 - 4.0 K/uL   Monocytes Relative 11 3 - 12 %   Monocytes Absolute 1.0 0.1 - 1.0 K/uL   Eosinophils Relative 0 0 - 5 %   Eosinophils Absolute 0.0 0.0 - 0.7 K/uL   Basophils Relative 0 0 - 1 %   Basophils Absolute 0.0 0.0 - 0.1 K/uL  Comprehensive metabolic panel     Status: Abnormal   Collection Time: 01/15/15  6:51 AM  Result Value Ref Range   Sodium 139 135 - 145 mmol/L   Potassium 4.0 3.5 - 5.1 mmol/L   Chloride 103 101 - 111 mmol/L   CO2 25 22 - 32 mmol/L   Glucose, Bld 127 (H) 65 - 99 mg/dL   BUN 27 (H) 6 - 20 mg/dL   Creatinine, Ser 1.17 0.61 - 1.24 mg/dL   Calcium 9.5 8.9 - 10.3 mg/dL   Total Protein 7.0 6.5 - 8.1 g/dL   Albumin 4.1 3.5 - 5.0 g/dL   AST 30 15 - 41 U/L   ALT 25 17 - 63 U/L   Alkaline Phosphatase 90 38 - 126 U/L   Total Bilirubin 0.5 0.3 - 1.2 mg/dL   GFR calc non Af Amer >60 >60 mL/min   GFR calc Af Amer >60 >60 mL/min    Comment: (NOTE) The eGFR has been calculated using the CKD EPI equation. This calculation has not been validated in all clinical situations. eGFR's persistently <60 mL/min signify possible Chronic Kidney Disease.    Anion gap 11 5 - 15  I-Stat Chem 8, ED  (not at [facility], [facility])     Status: Abnormal   Collection Time: 01/15/15  6:57 AM  Result Value Ref Range   Sodium 140 135 - 145 mmol/L   Potassium 4.0 3.5 - 5.1 mmol/L   Chloride 106 101 - 111 mmol/L   BUN 27 (H) 6 - 20 mg/dL   Creatinine, Ser 1.20 0.61 - 1.24 mg/dL   Glucose, Bld 127 (H) 65 - 99 mg/dL   Calcium, Ion 1.18 1.12 - 1.23 mmol/L   TCO2 22 0 - 100 mmol/L   Hemoglobin 14.3 13.0 - 17.0 g/dL   HCT 42.0 39.0 - 52.0 %  I-stat troponin, ED (not at  [facility], [facility])     Status: None   Collection Time: 01/15/15  7:05 AM  Result Value Ref Range   Troponin i, poc 0.00 0.00 - 0.08 ng/mL   Comment 3            Comment: Due to the release kinetics of cTnI, a negative result within the first hours of the onset of symptoms does not rule out myocardial infarction with certainty. If myocardial infarction is still suspected, repeat the test at appropriate intervals.   Urine Drug Screen     Status: Abnormal   Collection Time: 01/15/15  8:25 AM  Result Value Ref Range   Opiates POSITIVE (A) NONE DETECTED   Cocaine NONE DETECTED NONE DETECTED   Benzodiazepines NONE DETECTED NONE DETECTED   Amphetamines NONE DETECTED NONE DETECTED   Tetrahydrocannabinol POSITIVE (A) NONE DETECTED  Barbiturates NONE DETECTED NONE DETECTED    Comment:        DRUG SCREEN FOR MEDICAL PURPOSES ONLY.  IF CONFIRMATION IS NEEDED FOR ANY PURPOSE, NOTIFY LAB WITHIN 5 DAYS.        LOWEST DETECTABLE LIMITS FOR URINE DRUG SCREEN Drug Class       Cutoff (ng/mL) Amphetamine      1000 Barbiturate      200 Benzodiazepine   149 Tricyclics       702 Opiates          300 Cocaine          300 THC              50   Urinalysis, Routine w reflex microscopic     Status: Abnormal   Collection Time: 01/15/15  8:25 AM  Result Value Ref Range   Color, Urine YELLOW YELLOW   APPearance CLEAR CLEAR   Specific Gravity, Urine 1.017 1.005 - 1.030   pH 5.5 5.0 - 8.0   Glucose, UA 100 (A) NEGATIVE mg/dL   Hgb urine dipstick NEGATIVE NEGATIVE   Bilirubin Urine NEGATIVE NEGATIVE   Ketones, ur NEGATIVE NEGATIVE mg/dL   Protein, ur NEGATIVE NEGATIVE mg/dL   Urobilinogen, UA 0.2 0.0 - 1.0 mg/dL   Nitrite NEGATIVE NEGATIVE   Leukocytes, UA NEGATIVE NEGATIVE    Comment: MICROSCOPIC NOT DONE ON URINES WITH NEGATIVE PROTEIN, BLOOD, LEUKOCYTES, NITRITE, OR GLUCOSE <1000 mg/dL.   Ct Head Wo Contrast  01/15/2015   CLINICAL DATA:  Acute onset of left-sided weakness and numbness approximately  12 hr ago, subsequently resolved. Patient states that the symptoms of occurred intermittently over the past several months.  EXAM: CT HEAD WITHOUT CONTRAST  TECHNIQUE: Contiguous axial images were obtained from the base of the skull through the vertex without intravenous contrast.  COMPARISON:  11/18/2006.  FINDINGS: Ventricular system normal in size and appearance for age. No significant atrophy. Since the prior examination in 2008, the patient has developed low-attenuation within the pons and midbrain, and possibly also in the medulla. No evidence of generalized white matter changes. No new brain parenchymal abnormalities elsewhere. No acute hemorrhage or hematoma. No extra-axial fluid collections. No visible mass lesion. No evidence of acute stroke.  No skull fracture or other focal osseous abnormality involving the skull. Visualized paranasal sinuses, bilateral mastoid air cells and bilateral middle ear cavities well-aerated. Mild bilateral carotid siphon atherosclerosis.  IMPRESSION: 1. Possible ischemic changes in the pons, midbrain, and medulla without associated hemorrhage. 2. Age advanced mild bilateral carotid siphon atherosclerosis. MRI of the brain without contrast may be helpful in further evaluation to confirm or deny the above findings.   Electronically Signed   By: Evangeline Dakin M.D.   On: 01/15/2015 08:13     Assessment: 54 y.o. male with complains of transient left sided numbness-weakness yesterday and this morning. Although the weakness is gone, he reports persistent numbness left arm-foot. Right brain TIA versus small acute right subcortical infarct. CT brain was personally reviewed and showed no acute abnormality. He is not a tpa candidate due to late presentation and only mild sensory impairment at this moment. Complete TIA/ stroke work up. Patient with prior history of GI bleeding, thus need to add a proton-pump inhibitor to aspirin for secondary stroke prevention and minimize risk  of rebleeding. Stroke team will resume care tomorrow.  Stroke Risk Factors - HTN, hyperlipidemia, carotid disease.  Plan: 1. HgbA1c, fasting lipid panel 2. MRI, MRA  of the brain  without contrast 3. Echocardiogram 4. Carotid dopplers 5. Prophylactic therapy-plavix 6. Risk factor modification 7. Telemetry monitoring 8. Frequent neuro checks 9. PT/OT SLP  Dorian Pod, MD [facility] [phone]  01/15/2015, 10:45 AM

## 2015-01-16 ENCOUNTER — Inpatient Hospital Stay (HOSPITAL_COMMUNITY): Payer: 59

## 2015-01-16 DIAGNOSIS — I6522 Occlusion and stenosis of left carotid artery: Secondary | ICD-10-CM

## 2015-01-16 DIAGNOSIS — G451 Carotid artery syndrome (hemispheric): Secondary | ICD-10-CM

## 2015-01-16 DIAGNOSIS — I6521 Occlusion and stenosis of right carotid artery: Secondary | ICD-10-CM

## 2015-01-16 DIAGNOSIS — I10 Essential (primary) hypertension: Secondary | ICD-10-CM

## 2015-01-16 DIAGNOSIS — I6529 Occlusion and stenosis of unspecified carotid artery: Secondary | ICD-10-CM | POA: Diagnosis present

## 2015-01-16 DIAGNOSIS — E782 Mixed hyperlipidemia: Secondary | ICD-10-CM

## 2015-01-16 DIAGNOSIS — I82409 Acute embolism and thrombosis of unspecified deep veins of unspecified lower extremity: Secondary | ICD-10-CM

## 2015-01-16 DIAGNOSIS — I8290 Acute embolism and thrombosis of unspecified vein: Secondary | ICD-10-CM | POA: Diagnosis present

## 2015-01-16 DIAGNOSIS — G4730 Sleep apnea, unspecified: Secondary | ICD-10-CM

## 2015-01-16 DIAGNOSIS — I6523 Occlusion and stenosis of bilateral carotid arteries: Principal | ICD-10-CM

## 2015-01-16 DIAGNOSIS — I6789 Other cerebrovascular disease: Secondary | ICD-10-CM

## 2015-01-16 DIAGNOSIS — G459 Transient cerebral ischemic attack, unspecified: Secondary | ICD-10-CM | POA: Diagnosis present

## 2015-01-16 DIAGNOSIS — Z720 Tobacco use: Secondary | ICD-10-CM

## 2015-01-16 LAB — COMPREHENSIVE METABOLIC PANEL
ALK PHOS: 74 U/L (ref 38–126)
ALT: 21 U/L (ref 17–63)
AST: 26 U/L (ref 15–41)
Albumin: 3.5 g/dL (ref 3.5–5.0)
Anion gap: 9 (ref 5–15)
BILIRUBIN TOTAL: 0.8 mg/dL (ref 0.3–1.2)
BUN: 16 mg/dL (ref 6–20)
CALCIUM: 9.1 mg/dL (ref 8.9–10.3)
CHLORIDE: 101 mmol/L (ref 101–111)
CO2: 28 mmol/L (ref 22–32)
CREATININE: 1.14 mg/dL (ref 0.61–1.24)
GFR calc Af Amer: >60 mL/min (ref >60)
Glucose, Bld: 94 mg/dL (ref 65–99)
Potassium: 3.9 mmol/L (ref 3.5–5.1)
Sodium: 138 mmol/L (ref 135–145)
TOTAL PROTEIN: 6.2 g/dL — AB (ref 6.5–8.1)

## 2015-01-16 LAB — LIPID PANEL
Cholesterol: 266 mg/dL — ABNORMAL HIGH (ref 0–200)
HDL: 37 mg/dL — AB (ref >40)
LDL Cholesterol: UNDETERMINED mg/dL (ref 0–99)
TRIGLYCERIDES: 407 mg/dL — AB (ref <150)
Total CHOL/HDL Ratio: 7.2 RATIO
VLDL: UNDETERMINED mg/dL (ref 0–40)

## 2015-01-16 LAB — CBC
HEMATOCRIT: 42.2 % (ref 39.0–52.0)
HEMOGLOBIN: 14.2 g/dL (ref 13.0–17.0)
MCH: 31.2 pg (ref 26.0–34.0)
MCHC: 33.6 g/dL (ref 30.0–36.0)
MCV: 92.7 fL (ref 78.0–100.0)
PLATELETS: 243 10*3/uL (ref 150–400)
RBC: 4.55 MIL/uL (ref 4.22–5.81)
RDW: 12.9 % (ref 11.5–15.5)
WBC: 10.3 10*3/uL (ref 4.0–10.5)

## 2015-01-16 LAB — HEPARIN LEVEL (UNFRACTIONATED): HEPARIN UNFRACTIONATED: 0.14 [IU]/mL — AB (ref 0.30–0.70)

## 2015-01-16 LAB — ANTITHROMBIN III
AntiThromb III Func: 115 % (ref 75–120)
AntiThromb III Func: 118 % (ref 75–120)

## 2015-01-16 MED ORDER — IOHEXOL 350 MG/ML SOLN
50.0000 mL | Freq: Once | INTRAVENOUS | Status: AC | PRN
  Administered 2015-01-16: 50 mL via INTRAVENOUS

## 2015-01-16 MED ORDER — HYDROCODONE-ACETAMINOPHEN 5-325 MG PO TABS
1.0000 | ORAL_TABLET | Freq: Four times a day (QID) | ORAL | Status: DC | PRN
Start: 2015-01-16 — End: 2015-01-20
  Administered 2015-01-16 – 2015-01-20 (×7): 1 via ORAL
  Filled 2015-01-16 (×8): qty 1

## 2015-01-16 MED ORDER — HEPARIN (PORCINE) IN NACL 100-0.45 UNIT/ML-% IJ SOLN
1300.0000 [IU]/h | INTRAMUSCULAR | Status: DC
  Administered 2015-01-16: 1150 [IU]/h via INTRAVENOUS
  Administered 2015-01-17: 1300 [IU]/h via INTRAVENOUS
  Administered 2015-01-18: 1200 [IU]/h via INTRAVENOUS
  Administered 2015-01-18: 1300 [IU]/h via INTRAVENOUS
  Filled 2015-01-16 (×4): qty 250

## 2015-01-16 MED ORDER — SIMVASTATIN 20 MG PO TABS
20.0000 mg | ORAL_TABLET | Freq: Every day | ORAL | Status: DC
  Administered 2015-01-16 – 2015-01-19 (×4): 20 mg via ORAL
  Filled 2015-01-16 (×5): qty 1

## 2015-01-16 MED ORDER — NICOTINE 14 MG/24HR TD PT24
14.0000 mg | MEDICATED_PATCH | Freq: Every day | TRANSDERMAL | Status: DC
  Administered 2015-01-16 – 2015-01-20 (×5): 14 mg via TRANSDERMAL
  Filled 2015-01-16 (×5): qty 1

## 2015-01-16 NOTE — Evaluation (Signed)
Physical Therapy Evaluation Patient Details Name: Martin Archer MRN: [id number] DOB: Apr 26, 1961 Today's Date: 01/16/2015   History of Present Illness  54 y.o. male with complains of transient left sided numbness-weakness yesterday and this morning. Although the weakness is gone, he reports persistent numbness left arm-foot. Right brain TIA versus small acute right subcortical infarct.  Clinical Impression   Patient evaluated by Physical Therapy with no further acute PT needs identified. All education has been completed and the patient has no further questions.  See below for any follow-up Physical Therapy or equipment needs. PT is signing off. Thank you for this referral.     Follow Up Recommendations No PT follow up    Equipment Recommendations  None recommended by PT    Recommendations for Other Services       Precautions / Restrictions Precautions Precautions: None Restrictions Weight Bearing Restrictions: No      Mobility  Bed Mobility                  Transfers Overall transfer level: Independent                  Ambulation/Gait Ambulation/Gait assistance: Independent Ambulation Distance (Feet): 520 Feet Assistive device: None (and pushing IV pole) Gait Pattern/deviations: WFL(Within Functional Limits) (WNL)   Gait velocity interpretation: at or above normal speed for age/gender    Stairs            Wheelchair Mobility    Modified Rankin (Stroke Patients Only)       Balance Overall balance assessment: No apparent balance deficits (not formally assessed)                                           Pertinent Vitals/Pain Pain Assessment: No/denies pain    Home Living Family/patient expects to be discharged to:: Private residence Living Arrangements: Alone Available Help at Discharge: Family;Available PRN/intermittently Type of Home: House                Prior Function Level of Independence:  Independent               Hand Dominance   Dominant Hand: Right    Extremity/Trunk Assessment   Upper Extremity Assessment: Overall WFL for tasks assessed           Lower Extremity Assessment: Overall WFL for tasks assessed      Cervical / Trunk Assessment: Normal  Communication   Communication: No difficulties  Cognition Arousal/Alertness: Awake/alert Behavior During Therapy: WFL for tasks assessed/performed Overall Cognitive Status: Within Functional Limits for tasks assessed                      General Comments General comments (skin integrity, edema, etc.): Discussed signs and symptoms of stroke and stroke risk factors    Exercises        Assessment/Plan    PT Assessment Patent does not need any further PT services  PT Diagnosis Other (comment) (transient L sided weakness)   PT Problem List    PT Treatment Interventions     PT Goals (Current goals can be found in the Care Plan section) Acute Rehab PT Goals Patient Stated Goal: Hopes to be able to quit smoking PT Goal Formulation: All assessment and education complete, DC therapy    Frequency     Barriers to discharge  Co-evaluation               End of Session   Activity Tolerance: Patient tolerated treatment well Patient left: in chair;with call bell/phone within reach Nurse Communication: Mobility status         Time: 1150-1202 PT Time Calculation (min) (ACUTE ONLY): 12 min   Charges:   PT Evaluation $Initial PT Evaluation Tier I: 1 Procedure     PT G CodesQuin Hoop 01/16/2015, 12:38 PM  Roney Marion, [place] Pager [phone] Office [phone]

## 2015-01-16 NOTE — Progress Notes (Signed)
  Echocardiogram 2D Echocardiogram has been performed.  Martin Archer 01/16/2015, 10:31 AM

## 2015-01-16 NOTE — Progress Notes (Addendum)
    [facility] has been requested to perform a transesophageal echocardiogram on this patient for TIA. Requested by neurology. After careful review of history and examination, the risks and benefits of transesophageal echocardiogram have been explained including risks of esophageal damage, perforation (1:10,000 risk), bleeding, pharyngeal hematoma as well as other potential complications associated with conscious sedation including aspiration, arrhythmia, respiratory failure and death. Alternatives to treatment were discussed, questions were answered. Patient is willing to proceed. I am unable to schedule this over the weekend but have passed his name along to the Monday AM team. He is NPO after midnight.  Melina Copa, PA-C 01/16/2015 2:58 PM

## 2015-01-16 NOTE — Progress Notes (Signed)
STROKE TEAM PROGRESS NOTE   HISTORY Martin Archer is a 54 y.o. male with a past medical history significant for HTN, hyperlipidemia, s/p left CEA, OSA, gastric ulcer with hemorrhage and obstruction, diverticulosis of colon, transferred to [facility] for further evaluation of left-sided numbness and weakness. He initially presented to [facility] with complains of numbness-weakness of the left face-arm-leg last night and upon awakening today. Patient indicated that last night he noticed a numb sensation of his left face-arm-leg accompanied by weakness of arm and leg that lasted a couple of hours and subsided. He said that his sister did notice some droopiness of the left face. Did not seek immediate medical attention, but became concerned this morning around 4 am when the symptoms came back again. Said that the weakness is gone but still having some numbness of the left arm and left foot. It is worth mentioning that he reports 5 or 6 similar episodes within the last couple of months. Complains of a mild HA and blurriness of vision, but denies vertigo, double vision, difficulty swallowing, slurred speech, imbalance, language impairment of confusion. CT brain was personally reviewed and showed no acute abnormality.  Date last known well: 01/14/15 Time last known well: unable to determine tPA Given: no, symptoms resolved   SUBJECTIVE (INTERVAL HISTORY) The patient's wife is at the bedside. Dr Erlinda Hong reviewed the CT angiogram images with the patient and his wife and pointed out the areas of concern. The patient has a long history of tobacco use. It was strongly advised that he quit smoking. The treatment plan was discussed in detail. (See below )  OBJECTIVE Temp:  [97.7 F (36.5 C)-99 F (37.2 C)] 97.9 F (36.6 C) (05/22 0553) Pulse Rate:  [67-114] 71 (05/22 0553) Cardiac Rhythm:  [-] Normal sinus rhythm (05/21 2000) Resp:  [14-20] 18 (05/22 0553) BP: (118-151)/(83-114) 145/101 mmHg (05/22 0553) SpO2:  [93  %-98 %] 94 % (05/22 0553)  No results for input(s): GLUCAP in the last 168 hours.  Recent Labs Lab 01/15/15 0651 01/15/15 0657 01/16/15 0341  NA 139 140 138  K 4.0 4.0 3.9  CL 103 106 101  CO2 25  --  28  GLUCOSE 127* 127* 94  BUN 27* 27* 16  CREATININE 1.17 1.20 1.14  CALCIUM 9.5  --  9.1    Recent Labs Lab 01/15/15 0651 01/16/15 0341  AST 30 26  ALT 25 21  ALKPHOS 90 74  BILITOT 0.5 0.8  PROT 7.0 6.2*  ALBUMIN 4.1 3.5    Recent Labs Lab 01/15/15 0651 01/15/15 0657 01/16/15 0341  WBC 9.2  --  10.3  NEUTROABS 6.1  --   --   HGB 13.7 14.3 14.2  HCT 40.8 42.0 42.2  MCV 94.2  --  92.7  PLT 255  --  243   No results for input(s): CKTOTAL, CKMB, CKMBINDEX, TROPONINI in the last 168 hours.  Recent Labs  01/15/15 0651  LABPROT 12.7  INR 0.93    Recent Labs  01/15/15 0825  COLORURINE YELLOW  LABSPEC 1.017  PHURINE 5.5  GLUCOSEU 100*  HGBUR NEGATIVE  BILIRUBINUR NEGATIVE  KETONESUR NEGATIVE  PROTEINUR NEGATIVE  UROBILINOGEN 0.2  NITRITE NEGATIVE  LEUKOCYTESUR NEGATIVE       Component Value Date/Time   CHOL 266* 01/16/2015 0341   CHOL 286* 10/12/2013 0436   TRIG 407* 01/16/2015 0341   TRIG 572* 10/12/2013 0436   TRIG 284* 07/15/2006 1158   HDL 37* 01/16/2015 0341   HDL 35* 10/12/2013 0436  CHOLHDL 7.2 01/16/2015 0341   CHOLHDL 5.9 CALC 07/15/2006 1158   VLDL UNABLE TO CALCULATE IF TRIGLYCERIDE OVER 400 mg/dL 01/16/2015 0341   LDLCALC UNABLE TO CALCULATE IF TRIGLYCERIDE OVER 400 mg/dL 01/16/2015 0341   LDLCALC NOT CALC 10/12/2013 0436   Lab Results  Component Value Date   HGBA1C 5.4 12/08/2014      Component Value Date/Time   LABOPIA POSITIVE* 01/15/2015 0825   COCAINSCRNUR NONE DETECTED 01/15/2015 0825   LABBENZ NONE DETECTED 01/15/2015 0825   AMPHETMU NONE DETECTED 01/15/2015 0825   THCU POSITIVE* 01/15/2015 0825   LABBARB NONE DETECTED 01/15/2015 0825     Recent Labs Lab 01/15/15 0651  ETH <5   I have personally reviewed  the radiological images below and agree with the radiology interpretations.  Dg Chest 2 View 01/15/2015    No acute cardiopulmonary process.  Possible 7 mm right lower lobe nodule. Recommend correlation with chest CT in the non acute setting.     Ct Head Wo Contrast 01/15/2015    1. Possible ischemic changes in the pons, midbrain, and medulla without associated hemorrhage.  2. Age advanced mild bilateral carotid siphon atherosclerosis.  3. MRI of the brain without contrast may be helpful in further evaluation to confirm or deny the above findings.      MRI / MRA Brain Wo Contrast 01/15/2015    Negative for acute or chronic ischemic change.  CT finding in the brainstem is an artifact.   Occluded left internal carotid artery of indeterminate age   Moderately severe stenosis distal left vertebral artery.     CT angiogram of the head and neck 01/16/2015 1. Occlusion of the left common carotid artery and left internal carotid artery from the arch through the skullbase. 2. 85% diameter stenosis proximal right internal carotid artery 3. Right vertebral artery widely patent. Mild to moderate stenosis distal left vertebral artery. 4. 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 This is nonocclusive. There is also probable nonocclusive thrombus in the right jugular vein.  Carotid duplex 01/15/2015 Right > 80% ICA stenosis at the level of the distal bulb into the proximal ICA.  Left - There appears to be an occlusion of the common carotid and Internal carotid artery.  The right vertebral artery is antegrade and the left carotid artery has a " bunny sign " consistent with a more proximal stenosis  2D echo - - Procedure narrative: Transthoracic echocardiography. Image quality was adequate. The study was technically difficult, as a result of poor acoustic windows and poor sound wave transmission. - Left ventricle: The cavity size was normal.  Systolic function was normal. The estimated ejection fraction was in the range of 55% to 60%. Wall motion was normal; there were no regional wall motion abnormalities. Doppler parameters are consistent with abnormal left ventricular relaxation (grade 1 diastolic dysfunction). Doppler parameters are consistent with high ventricular filling pressure. Mild to moderate concentric left ventricular hypertrophy. - Aortic valve: Mildly to moderately calcified annulus. Mildly thickened, mildly calcified leaflets. - Aorta: MIld aortic root dilatation. Aortic root dimension: 41 mm (ED). - Mitral valve: Mildly calcified annulus. Mildly thickened leaflets . There was mild regurgitation. - Left atrium: The atrium was severely dilated. Volume/bsa, ES, (1-plane Simpson&'s, A2C): 41 ml/m^2.  UE and LE venous doppler - pending  TEE - pending  Hypercoagulable work up - pending  Pan CT - pending  PHYSICAL EXAM  Temp:  [97.9 F (36.6 C)-99 F (37.2 C)] 98.6  F (37 C) (05/22 1319) Pulse Rate:  [67-114] 79 (05/22 1319) Resp:  [16-20] 18 (05/22 1319) BP: (118-151)/(87-114) 125/87 mmHg (05/22 1319) SpO2:  [94 %-99 %] 99 % (05/22 1319) Weight:  [190 lb 7.6 oz (86.4 kg)-195 lb 15.8 oz (88.9 kg)] 190 lb 7.6 oz (86.4 kg) (05/22 1044)  General - Well nourished, well developed, in no apparent distress.  Ophthalmologic - Sharp disc margins OU.   Cardiovascular - Regular rate and rhythm with no murmur.  Mental Status -  Level of arousal and orientation to time, place, and person were intact. Language including expression, naming, repetition, comprehension was assessed and found intact. Attention span and concentration were normal. Recent and remote memory were intact. Fund of Knowledge was assessed and was intact.  Cranial Nerves II - XII - II - Visual field intact OU. III, IV, VI - Extraocular movements intact. V - Facial sensation intact bilaterally. VII - Facial movement  intact bilaterally. VIII - Hearing & vestibular intact bilaterally. X - Palate elevates symmetrically. XI - Chin turning & shoulder shrug intact bilaterally. XII - Tongue protrusion intact.  Motor Strength - The patient's strength was normal in all extremities and pronator drift was absent.  Bulk was normal and fasciculations were absent.   Motor Tone - Muscle tone was assessed at the neck and appendages and was normal.  Reflexes - The patient's reflexes were 1+ in all extremities and he had no pathological reflexes.  Sensory - Light touch, temperature/pinprick, vibration and proprioception, and Romberg testing were assessed and were symmetrical.    Coordination - The patient had normal movements in the hands and feet with no ataxia or dysmetria.  Tremor was absent.  Gait and Station - The patient's transfers, posture, gait, station, and turns were observed as normal.  ASSESSMENT/PLAN Mr. Lacarlos Respass Schnitzler is a 54 y.o. male with history of hypertension, ongoing tobacco use, hyperlipidemia, status post left carotid endarterectomy, obstructive sleep apnea, gastric ulcer with hemorrhage and obstruction, and diverticulosis presenting with multiple transient episodes of left-sided numbness and weakness as well as a mild headache, facial droop, and blurred vision. He did not receive IV t-PA due to late presentation.  TIA:  Non-dominant - right hemisphere  Resultant  resolved  MRI  No acute infarct  MRA  Left ICA occlusion  Carotid Doppler  Right >80% ICA stenosis  2-D echocardiogram - pending  CTA showed left CCA occlusion, right ICA 85%, bilateral jugular vein thrombus  LDL - unable to calculate secondary to triglycerides greater than 400  HgbA1c pending  Heparin for VTE prophylaxis  Diet Heart Room service appropriate?: Yes; Fluid consistency:: Thin  no antithrombotic prior to admission, now on heparin  Ongoing aggressive stroke risk factor management  Therapy  recommendations: No follow-up PT  Disposition: Pending  Bilateral jugular vein thrombosis  Etiology not clear  Check venous doppler of LEs and UEs - pending  Hypercoagulable work up - pending  pan CT to rule out malignancy in am - pending  On heparin drip  TEE requested - pending  Carotid occlusion/stenosis  Left CCA occlusion  S/p left ICA CEA but no occluded - likely due to emboli instead of plaque formation  Right ICA 85% stenosis  Recommend vascular surgery consult  On heparin drip   Hypertension  Home meds: Norvasc, losartan, and metoprolol  Blood pressure is running mildly high; however, patient needs elevated BP to maintain perfusion.  Antihypertensive medications on hold  BP goal 130-150 due to carotid occlusion/stenosis  Hyperlipidemia  Home meds:  No lipid lowering medications prior to admission  LDL unable to calculate, goal < 70  Zocor and fenofibrate added  Continue statin at discharge  Diabetes  HgbA1c pending, goal < 7.0  Controlled  Tobacco abuse  Current smoker  Smoking cessation counseling provided  Nicotine patch provided  Pt is willing to quit  Other Stroke Risk Factors  Cigarette smoker, advised to stop smoking  Obesity, There is no weight on file to calculate BMI.   Family hx stroke (father and uncle)  OSA - not on CPAP  Other Active Problems  Back pain - Vicodin  Diffuse thrombus  PLAN  IV heparin per pharmacy protocol for thrombus  Vascular surgery consult for right internal carotid artery stenosis  Check hypercoagulable panel  Rule out malignancy - possible nodule on chest x-ray.  CT of chest abdomen and pelvis to rule out malignancy as source of thrombus  TEE planned for Monday - cardiology aware.  Smoking cessation - nicotine patches.  Permissive hypertension in the setting of carotid stenosis.  Upper and Lower extremity venous Dopplers  Treat mixed hyperlipidemia - Zocor and  fenofibrate  Hospital day # 1  Mikey Bussing PA-C [facility] Pager [phone] 01/16/2015, 1:41 PM  I, the attending vascular neurologist, have personally obtained a history, examined the patient, evaluated laboratory data, individually viewed imaging studies and agree with radiology interpretations. I also discussed with pt and wife and Dr. Grandville Silos regarding his care plan. Together with the NP/PA, we formulated the assessment and plan of care which reflects our mutual decision.  I have made any additions or clarifications directly to the above note and agree with the findings and plan as currently documented.   54 yo M with hx of left CEA 8 years ago, HTN, HLD, smoker presented with TIA with left sided symptoms. Stroke w/u found to have left CCA occlusion, right ICA 85% stenosis, bilateral jugular vein thrombosis. Put on heparin drip. Need to have further work up with LE and UE venous doppler, TEE, pan CT, vascular surgery consultation. Mixed HLD put on statin and fibrates. Hypercoagulable work up also placed.  Rosalin Hawking, MD PhD Stroke Neurology 01/16/2015 2:31 PM   To contact Stroke Continuity provider, please refer to [email or web address] After hours, contact General Neurology

## 2015-01-16 NOTE — Evaluation (Signed)
Speech Language Pathology Evaluation Patient Details Name: Martin Archer MRN: [id number] DOB: 11-22-1960 Today's Date: 01/16/2015 Time: [id number] SLP Time Calculation (min) (ACUTE ONLY): 17 min  Problem List:  Patient Active Problem List   Diagnosis Date Noted  . TIA (transient ischemic attack) 01/16/2015  . Carotid stenosis 01/16/2015  . Thrombus: BILATERAL EXTERNAL JUGULAR VEINS per CT angio head and neck 01/16/15 01/16/2015  . Numbness and tingling of left arm and leg 01/15/2015  . CVA (cerebral infarction)   . Paresthesia 12/08/2014  . 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 ADD (attention deficit disorder) 07/14/2013  . Nicotine dependence 06/18/2013  . Back pain, chronic 06/17/2013  . ACUTE GASTRIC ULCER W/HEMORRHAGE AND OBSTRUCTION 02/28/2010  . PERSONAL HX COLONIC POLYPS 02/28/2010  . DIVERTICULOSIS, COLON 02/02/2010  . ANEMIA, SEVERE 01/13/2010  . Essential hypertension 01/13/2010  . Sleep apnea 01/13/2010  . Left carotid bruit 01/13/2010  . 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 Mixed hyperlipidemia 01/13/2007   Past Medical History:  Past Medical History  Diagnosis Date  . Gastric ulcer with hemorrhage and obstruction 2011    Dr F[facility]  . Anemia, unspecified   . Personal history of colonic polyps 01/30/2010    hyperplastic rectal  . Diverticulosis of colon (without mention of hemorrhage)   . Sleep apnea     on CPAP; [facility]  . Hypertension   . Hyperlipemia   . Arthritis    Past Surgical History:  Past Surgical History  Procedure Laterality Date  . Inguinal hernia repair  1963  . Wisdom tooth extraction    . Nasal fracture surgery    . Carotid endarterectomy  11/2006    left Dr Mamie Nick  . Esophagogastroduodenoscopy Left 06/17/2013    Procedure: ESOPHAGOGASTRODUODENOSCOPY (EGD);  Surgeon: Arta Silence, MD;  Location: [facility];  Service: Endoscopy;  Laterality: Left;   HPI:  Pt is a 54 y.o. male with PMH of  gastric ulcer with hemorrhage and obstruction, anemia, HTN- came to hospital with complaint of numbnesso f left arm and leg, also left-side facial droop. Pt reports having these episodes the past few months which last for 30 minutes and then resolve. Facial droop has since resolved. MRI/MRA negative for acute infarct but did reveal occlusion of L carotid artery which has resulted in R brain TIAs. No needs identified by PT/ OT. SLP eval ordered as part of stroke workup.   Assessment / Plan / Recommendation Clinical Impression  Pt demonstrated speech, language, and cognition skills within functional limits for tasks assessed (naming, following directions, short-term memory, attention, pragmatics/ conversation, reasoning, reading). Pt reports some difficulties with short-term memory but that this has been an issue prior to admission to hospital. No SLP needs identified- will sign off. Please re-consult if needs arise.    SLP Assessment  Patient does not need any further Speech Lanaguage Pathology Services    Follow Up Recommendations  None    Frequency and Duration        Pertinent Vitals/Pain Pain Assessment: No/denies pain   SLP Goals     SLP Evaluation Prior Functioning  Cognitive/Linguistic Baseline: Within functional limits Type of Home: House Available Help at Discharge: Family;Available PRN/intermittently Vocation: Full time employment   Cognition  Overall Cognitive Status: Within Functional Limits for tasks assessed Arousal/Alertness: Awake/alert Orientation Level: Oriented X4 Attention: Selective;Alternating Selective Attention: Appears intact Alternating Attention: Appears intact Memory: Appears intact Awareness: Appears intact Problem Solving: Appears intact Executive Function: Reasoning Reasoning: Appears intact Safety/Judgment:  Appears intact    Comprehension  Auditory Comprehension Overall Auditory Comprehension: Appears within functional limits for tasks  assessed Yes/No Questions: Within Functional Limits Commands: Within Functional Limits Conversation: Complex Reading Comprehension Reading Status: Within funtional limits    Expression Expression Primary Mode of Expression: Verbal Verbal Expression Overall Verbal Expression: Appears within functional limits for tasks assessed Initiation: No impairment Level of Generative/Spontaneous Verbalization: Conversation Repetition: No impairment Naming: No impairment Pragmatics: No impairment Non-Verbal Means of Communication: Not applicable Written Expression Dominant Hand: Right   Oral / Motor Oral Motor/Sensory Function Overall Oral Motor/Sensory Function: Appears within functional limits for tasks assessed Motor Speech Overall Motor Speech: Appears within functional limits for tasks assessed   GO     Kern Reap, MA, CCC-SLP 01/16/2015, 3:18 PM

## 2015-01-16 NOTE — Progress Notes (Signed)
ANTICOAGULATION CONSULT NOTE - Initial Consult  Pharmacy Consult for heparin Indication: DVT  Allergies  Allergen Reactions  . Adderall [Amphetamine-Dextroamphet Er]     diarrhea  . Prozac [Fluoxetine Hcl]     diarrhea  . Sertraline Hcl     REACTION: diarrhea    Patient Measurements: Height: 5\' 7"  (170.2 cm) Weight: 190 lb 7.6 oz (86.4 kg) IBW/kg (Calculated) : 66.1 Heparin Dosing Weight: 84kg  Vital Signs: Temp: 98.1 F (36.7 C) (05/22 0926) Temp Source: Oral (05/22 0926) BP: 147/102 mmHg (05/22 0926) Pulse Rate: 69 (05/22 0926)  Labs:  Recent Labs  01/15/15 0651 01/15/15 0657 01/16/15 0341  HGB 13.7 14.3 14.2  HCT 40.8 42.0 42.2  PLT 255  --  243  APTT 23*  --   --   LABPROT 12.7  --   --   INR 0.93  --   --   CREATININE 1.17 1.20 1.14    Estimated Creatinine Clearance: 75.9 mL/min (by C-G formula based on Cr of 1.14).   Medical History: Past Medical History  Diagnosis Date  . Gastric ulcer with hemorrhage and obstruction 2011    Dr [facility]  . Anemia, unspecified   . Personal history of colonic polyps 01/30/2010    hyperplastic rectal  . Diverticulosis of colon (without mention of hemorrhage)   . Sleep apnea     on CPAP; [facility]  . Hypertension   . Hyperlipemia   . Arthritis     Assessment: 54 YOM brought in 5/21 AM with numbness of left arm and leg and left-sided facial drop the night before- did not come to the hospital as he thought it would improve on its own. Was not given tPA d/t delay in arrival. Carotid ultrasound performed and revealed occulusion of common carotid and internal carotid. CT angio also revealed a nonocclusive thrombus in the left external jugular vein extending into the left internal jugular vein along with probable nonocclusive thrombus in the right jugular vein.  Patient not on anticoagulation PTA. Baseline hgb 14.2, plts 243. Baseline aPTT 23 second with PT of 12.7  Goal of Therapy:  Heparin  level 0.3-0.5 units/ml Monitor platelets by anticoagulation protocol: Yes   Plan:  -heparin NO BOLUS with recent stroke- start gtt at 1150 units/hr -heparin level in 6 hours -daily HL and CBC -follow closely for any changes in mental status/ stroke symptoms -follow for long term AC plans  Kinzie Wickes D. Francisco Ostrovsky, PharmD, BCPS Clinical Pharmacist Pager: [phone] 01/16/2015 10:50 AM

## 2015-01-16 NOTE — Evaluation (Signed)
Occupational Therapy Evaluation and Discharge Patient Details Name: Martin Archer MRN: [id number] DOB: 1961/06/10 Today's Date: 01/16/2015    History of Present Illness 54 y.o. male with complains of transient left sided numbness-weakness yesterday and this morning. Although the weakness is gone, he reports persistent numbness left arm-foot. Right brain TIA versus small acute right subcortical infarct.   Clinical Impression   PTA pt lived at home and was independent with ADLs. Pt currently at [place] with ADLs and educated on safety and fall prevention. Pt plans to f/u with eye doctor for vision assessment. No further acute OT needs.     Follow Up Recommendations  No OT follow up    Equipment Recommendations  None recommended by OT    Recommendations for Other Services       Precautions / Restrictions Precautions Precautions: None Restrictions Weight Bearing Restrictions: No      Mobility Bed Mobility                  Transfers Overall transfer level: Independent                    Balance Overall balance assessment: No apparent balance deficits (not formally assessed)                                          ADL Overall ADL's : Modified independent                                       General ADL Comments: Educated pt on safety with ADLs. Educated on signs and symptoms of stroke and importance of seeking medical care ASAP. Discussed safety with hand numbness if it recurs.      Vision Additional Comments: Vision screen [facility]. R eye has mild eyelid droop (pstosis) but pt reports this is normal. Does report blurring vision over the past few weeks and has plans to see eye doctor. Eyes moving in conjunction. No eye strain.           Pertinent Vitals/Pain Pain Assessment: No/denies pain     Hand Dominance Right   Extremity/Trunk Assessment Upper Extremity Assessment Upper Extremity Assessment:  Overall WFL for tasks assessed   Lower Extremity Assessment Lower Extremity Assessment: Overall WFL for tasks assessed   Cervical / Trunk Assessment Cervical / Trunk Assessment: Normal   Communication Communication Communication: No difficulties   Cognition Arousal/Alertness: Awake/alert Behavior During Therapy: WFL for tasks assessed/performed Overall Cognitive Status: Within Functional Limits for tasks assessed                                Home Living Family/patient expects to be discharged to:: Private residence Living Arrangements: Alone Available Help at Discharge: Family;Available PRN/intermittently Type of Home: House                                  Prior Functioning/Environment Level of Independence: Independent             OT Diagnosis: Generalized weakness    End of Session  Activity Tolerance: Patient tolerated treatment well Patient left: in bed;with call bell/phone within reach   Time: [id number] OT  Time Calculation (min): 10 min Charges:  OT General Charges $OT Visit: 1 Procedure OT Evaluation $Initial OT Evaluation Tier I: 1 Procedure G-Codes:    Juluis Rainier 15-Feb-2015, 2:19 PM  Cyndie Chime, OTR/L Occupational Therapist [phone] (pager)

## 2015-01-16 NOTE — Consult Note (Signed)
[facility] [place]  Patient name: Martin Archer MRN: [id number] DOB: 1961-05-28 Sex: male  REASON FOR CONSULT: Symptomatic carotid disease. Consult is from [facility].  HPI: Martin Archer is a 54 y.o. male who was admitted on 01/15/15 with the sudden onset of left arm and left leg weakness. He also had some left sided facial droop. The symptoms lasted around 30 minutes. He was not on ASA. He also states that at the same time he would have paralysis of his right arm that also would resolve completely after 30 minutes. He states that he's had multiple similar episodes over the last several months. These all passed proximally 30 minutes.  He underwent a Left CEA around 8 years ago by Dr. Kellie Simmering. He apparently was lost to follow up.    His risk factors for vascular disease include hypertension, hypercholesterolemia, and tobacco use. He denies any history of diabetes or family history of premature cardiovascular disease.  He denies any history of myocardial infarction, congestive heart failure,  Past Medical History  Diagnosis Date  . Gastric ulcer with hemorrhage and obstruction 2011    Dr [facility]  . Anemia, unspecified   . Personal history of colonic polyps 01/30/2010    hyperplastic rectal  . Diverticulosis of colon (without mention of hemorrhage)   . Sleep apnea     on CPAP; [facility]  . Hypertension   . Hyperlipemia   . Arthritis     Family History  Problem Relation Age of Onset  . Diabetes Father   . Hypertension Father   . Stroke Father     in late 75s  . Heart attack Mother     in 26s  . Heart attack Brother 65  . Diabetes Paternal Grandfather   . Cancer Maternal Grandfather     unknown type  . Heart attack Maternal Grandfather 76  . Colon cancer Neg Hx   . Esophageal cancer Neg Hx   . Stomach cancer Neg Hx   . Stroke Paternal Uncle     in late 25s    SOCIAL HISTORY: History  Substance Use  Topics  . Smoking status: Current Every Day Smoker -- 1.00 packs/day    Types: Cigarettes  . Smokeless tobacco: Never Used     Comment: smoked 1976-2007 & 2009- present , up to 2 ppd, average > 1ppd  . Alcohol Use: No     Comment: currently at [facility] for treatment.     Allergies  Allergen Reactions  . Adderall [Amphetamine-Dextroamphet Er]     diarrhea  . Prozac [Fluoxetine Hcl]     diarrhea  . Sertraline Hcl     REACTION: diarrhea    Current Facility-Administered Medications  Medication Dose Route Frequency Provider Last Rate Last Dose  . fenofibrate tablet 160 mg  160 mg Oral Daily Oswald Hillock, MD   160 mg at 01/16/15 1000  . heparin ADULT infusion 100 units/mL (25000 units/250 mL)  1,150 Units/hr Intravenous Continuous Lauren D Bajbus, RPH 11.5 mL/hr at 01/16/15 1100 1,150 Units/hr at 01/16/15 1100  . HYDROcodone-acetaminophen (NORCO/VICODIN) 5-325 MG per tablet 1 tablet  1 tablet Oral Q6H PRN Eugenie Filler, MD      . nicotine (NICODERM CQ - dosed in mg/24 hours) patch 14 mg  14 mg Transdermal Daily David L Rinehuls, PA-C   14 mg at 01/16/15 1045  . pantoprazole (PROTONIX) EC tablet 20 mg  20 mg Oral Daily [employer],  MD   20 mg at 01/16/15 1000  . simvastatin (ZOCOR) tablet 20 mg  20 mg Oral q1800 Eugenie Filler, MD        REVIEW OF SYSTEMS: Valu.Nieves ] denotes positive finding; [  ] denotes negative finding CARDIOVASCULAR:  [ ]  chest pain   [ ]  chest pressure   [ ]  palpitations   Valu.Nieves ] orthopnea   Valu.Nieves ] dyspnea on exertion   [ ]  claudication   [ ]  rest pain   [ ]  DVT   [ ]  phlebitis PULMONARY:   [ ]  productive cough   [ ]  asthma   Valu.Nieves ] wheezing NEUROLOGIC:   Valu.Nieves ] weakness  Valu.Nieves ] paresthesias  [ ]  aphasia  [ ]  amaurosis  Valu.Nieves ] dizziness HEMATOLOGIC:   [ ]  bleeding problems   [ ]  clotting disorders MUSCULOSKELETAL:  [ ]  joint pain   [ ]  joint swelling [ ]  leg swelling GASTROINTESTINAL: [ ]   blood in stool  [ ]   hematemesis GENITOURINARY:  [ ]   dysuria  [ ]    hematuria PSYCHIATRIC:  [ ]  history of major depression INTEGUMENTARY:  [ ]  rashes  [ ]  ulcers CONSTITUTIONAL:  [ ]  fever   [ ]  chills  PHYSICAL EXAM: Filed Vitals:   01/16/15 0553 01/16/15 0926 01/16/15 1000 01/16/15 1044  BP: 145/101 147/102    Pulse: 71 69    Temp: 97.9 F (36.6 C) 98.1 F (36.7 C)    TempSrc: Oral Oral    Resp: 18 20    Height:    5\' 7"  (1.702 m)  Weight:   195 lb 15.8 oz (88.9 kg) 190 lb 7.6 oz (86.4 kg)  SpO2: 94% 98%     Body mass index is 29.83 kg/(m^2). GENERAL: The patient is a well-nourished male, in no acute distress. The vital signs are documented above. CARDIOVASCULAR: There is a regular rate and rhythm. He has bilateral carotid bruits. He has palpable femoral pulses. I cannot palpate popliteal pulses. He has palpable dorsalis pedis and posterior tibial pulses. He has no significant lower extremity swelling. PULMONARY: There is good air exchange bilaterally without wheezing or rales. ABDOMEN: Soft and non-tender with normal pitched bowel sounds. I do not palpate an aneurysm. MUSCULOSKELETAL: There are no major deformities or cyanosis. NEUROLOGIC: No focal weakness or paresthesias are detected. SKIN: There are no ulcers or rashes noted. PSYCHIATRIC: The patient has a normal affect.  DATA:  Lab Results  Component Value Date   WBC 10.3 01/16/2015   HGB 14.2 01/16/2015   HCT 42.2 01/16/2015   MCV 92.7 01/16/2015   PLT 243 01/16/2015   Lab Results  Component Value Date   NA 138 01/16/2015   K 3.9 01/16/2015   CL 101 01/16/2015   CO2 28 01/16/2015   Lab Results  Component Value Date   CREATININE 1.14 01/16/2015   Lab Results  Component Value Date   INR 0.93 01/15/2015   Lab Results  Component Value Date   HGBA1C 5.4 12/08/2014   Carotid Duplex: > 80% right carotid stenosis. The Left ICA is occluded. There is evidence of a left vertebral artery stenosis.  CT Angio of neck: L  CCA and L ICA are occluded. 85% stenosis of proximal right  ICA. Moderate stenosis of left vertebral artery.   CT Head: Possible ischemic changes in the pons, midbrain, and medulla without associated hemorrhage. MRI suggest that this is an artifact.  MRI: No acute or chronic ischemic changes.  ECHO: results pend  MEDICAL ISSUES:  SYMPTOMATIC GREATER THAN 80% RIGHT CAROTID STENOSIS: This patient underwent previous left carotid endarterectomy in the left carotid system is now occluded. As a greater than 80% right carotid stenosis and has had right brain TIAs. I have recommended right carotid endarterectomy. He is scheduled for aTEE tomorrow. Dr. Kellie Simmering performed his previous surgery and I will discuss timing of surgery with Dr. Kellie Simmering. I have explained that without surgery his risk of stroke is approximate 10% in the next year and in 5-10% per year after that. He was not on aspirin during this event but is now on aspirin. He is also on intravenous heparin per neurology. He on a statin.  [name] [facility] [place]: [phone]

## 2015-01-16 NOTE — Progress Notes (Signed)
[facility] PROGRESS NOTE  Martin Archer [id number] DOB: [birth year] DOA: 01/15/2015 PCP: Unice Cobble, MD  Assessment/Plan: #1 probable TIA Clinical improvement. Symptoms resolved. CT head negative. MRI head no acute infarct. MRA head left ICA occlusion. Carotid Dopplers with greater than 80% right ICA stenosis. 2-D echo with normal EF with severely dilated left atrium. No source of emboli noted. CT angiogram showed left CCA occlusion, right ICA 85% stenosis, bilateral jugular venous thrombosis. LDL was unable to be Kiker secondary to triglycerides greater than 400. Hemoglobin A1c pending. Patient was not on any antithrombotic agent prior to admission. Patient currently on IV heparin for secondary stroke prophylaxis. TEE pending. Lower extremity Dopplers and upper extremity Dopplers pending. Consult with vascular surgery for further evaluation of carotid stenosis/occlusion. Neurology following and appreciate input and recommendation.  #2 bilateral jugular vein thrombosis Unknown etiology. Lower extremity and upper extremity Dopplers have been ordered per neurology. Check hypercoagulable panel. CT chest and abdomen and pelvis have been ordered per neurology to rule out malignancy. Patient has been started on IV heparin drip per neurology. TEE pending.  #3 hyperlipidemia LDL unable to be Kiker secondary to triglycerides greater than 400. Continue fenofibrate. Once that patient on a statin. Follow.  #4 carotid occlusion/stenosis Patient with a left CCA occlusion. Patient with a right ICA 85% stenosis. Patient has been started on IV heparin. Consult with vascular surgery.  #5 hypertension Blood pressure slightly high. Continue to hold antihypertensive medications for now. Goal blood pressure systolic [id number] to [id number] secondary to carotid occlusion/stenosis.  #6 tobacco abuse  tobacco cessation. Continue nicotine patch.  #7 chronic back pain Continue home pain regimen.  #8  prophylaxis On heparin for DVT prophylaxis.   Code Status: Full Family Communication: Updated patient and wife at bedside. Disposition Plan: Pending stroke evaluation probably home.   Consultants:  Neurology: Dr. Aram Beecham 01/15/2015    Procedures:  2-D echo 01/16/2015  CT head 01/15/2015  CT angiogram neck 01/16/2015  Carotid Dopplers 01/15/2015  MRI head MRA head 01/15/2015  Antibiotics:  None  HPI/Subjective: Patient denies any weakness. Patient states numbness has improved.  Objective: Filed Vitals:   01/16/15 0926  BP: 147/102  Pulse: 69  Temp: 98.1 F (36.7 C)  Resp: 20   No intake or output data in the 24 hours ending 01/16/15 1235 Filed Weights   01/16/15 1000 01/16/15 1044  Weight: 88.9 kg (195 lb 15.8 oz) 86.4 kg (190 lb 7.6 oz)    Exam:   General:  NAD  Cardiovascular: RRR  Respiratory: CTAB  Abdomen: Soft, nontender, nondistended, positive bowel sounds.  Musculoskeletal: No clubbing cyanosis or edema.  Data Reviewed: Basic Metabolic Panel:  Recent Labs Lab 01/15/15 0651 01/15/15 0657 01/16/15 0341  NA 139 140 138  K 4.0 4.0 3.9  CL 103 106 101  CO2 25  --  28  GLUCOSE 127* 127* 94  BUN 27* 27* 16  CREATININE 1.17 1.20 1.14  CALCIUM 9.5  --  9.1   Liver Function Tests:  Recent Labs Lab 01/15/15 0651 01/16/15 0341  AST 30 26  ALT 25 21  ALKPHOS 90 74  BILITOT 0.5 0.8  PROT 7.0 6.2*  ALBUMIN 4.1 3.5   No results for input(s): LIPASE, AMYLASE in the last 168 hours. No results for input(s): AMMONIA in the last 168 hours. CBC:  Recent Labs Lab 01/15/15 0651 01/15/15 0657 01/16/15 0341  WBC 9.2  --  10.3  NEUTROABS 6.1  --   --   HGB 13.7  14.3 14.2  HCT 40.8 42.0 42.2  MCV 94.2  --  92.7  PLT 255  --  243   Cardiac Enzymes: No results for input(s): CKTOTAL, CKMB, CKMBINDEX, TROPONINI in the last 168 hours. BNP (last 3 results) No results for input(s): BNP in the last 8760 hours.  ProBNP (last 3  results) No results for input(s): PROBNP in the last 8760 hours.  CBG: No results for input(s): GLUCAP in the last 168 hours.  No results found for this or any previous visit (from the past 240 hour(s)).   Studies: Dg Chest 2 View  01/15/2015   CLINICAL DATA:  Patient with left body numbness and dizziness.  EXAM: CHEST  2 VIEW  COMPARISON:  Chest radiograph 12/04/2006  FINDINGS: Monitoring leads overlie the patient. Cardiac contours upper limits of normal. No consolidative pulmonary opacities. Possible 7 mm right lower lobe nodule. No pleural effusion or pneumothorax. Regional skeleton is unremarkable.  IMPRESSION: No acute cardiopulmonary process.  Possible 7 mm right lower lobe nodule. Recommend correlation with chest CT in the non acute setting.   Electronically Signed   By: Lovey Newcomer M.D.   On: 01/15/2015 16:02   Ct Head Wo Contrast  01/15/2015   CLINICAL DATA:  Acute onset of left-sided weakness and numbness approximately 12 hr ago, subsequently resolved. Patient states that the symptoms of occurred intermittently over the past several months.  EXAM: CT HEAD WITHOUT CONTRAST  TECHNIQUE: Contiguous axial images were obtained from the base of the skull through the vertex without intravenous contrast.  COMPARISON:  11/18/2006.  FINDINGS: Ventricular system normal in size and appearance for age. No significant atrophy. Since the prior examination in 2008, the patient has developed low-attenuation within the pons and midbrain, and possibly also in the medulla. No evidence of generalized white matter changes. No new brain parenchymal abnormalities elsewhere. No acute hemorrhage or hematoma. No extra-axial fluid collections. No visible mass lesion. No evidence of acute stroke.  No skull fracture or other focal osseous abnormality involving the skull. Visualized paranasal sinuses, bilateral mastoid air cells and bilateral middle ear cavities well-aerated. Mild bilateral carotid siphon atherosclerosis.   IMPRESSION: 1. Possible ischemic changes in the pons, midbrain, and medulla without associated hemorrhage. 2. Age advanced mild bilateral carotid siphon atherosclerosis. MRI of the brain without contrast may be helpful in further evaluation to confirm or deny the above findings.   Electronically Signed   By: Evangeline Dakin M.D.   On: 01/15/2015 08:13   Ct Angio Neck W/cm &/or Wo/cm  01/16/2015   CLINICAL DATA:  Right brain TIA  EXAM: CT ANGIOGRAPHY NECK  TECHNIQUE: Multidetector CT imaging of the neck was performed using the standard protocol during bolus administration of intravenous contrast. Multiplanar CT image reconstructions and MIPs were obtained to evaluate the vascular anatomy. Carotid stenosis measurements (when applicable) are obtained utilizing NASCET criteria, using the distal internal carotid diameter as the denominator.  CONTRAST:  63mL OMNIPAQUE IOHEXOL 350 MG/ML SOLN  COMPARISON:  MRI/MRA head 01/15/2015  FINDINGS: Aortic arch: Mild atherosclerotic aortic arch. Lung apices are clear.  Right carotid system: Right common carotid artery is patent with mild atherosclerotic disease. There is calcified and noncalcified plaque in the carotid bulb. Critical stenosis measuring 85% diameter stenosis of the proximal right internal carotid artery . External carotid artery widely patent. Right internal carotid artery patent to the skullbase.  Left carotid system: Left common carotid artery is occluded at the aortic arch. Left internal carotid artery is occluded through the  skullbase with intracranial reconstitution. See MRA report from yesterday.  Vertebral arteries:Right vertebral artery widely patent to the basilar without stenosis.  Left vertebral artery demonstrates a mild to moderate stenosis at the level the foramen magnum, less severe than that suggested on MRA. There is also mild stenosis of the distal left vertebral artery before the basilar.  Jugular vein: Thrombosis in the left external jugular  vein extending into the lower left jugular vein. This is nonocclusive. There is also filling defect in the right internal jugular vein felt to be thrombus, less likely non-opacified blood mixing with opacified blood.  Skeleton: No acute bony change.  Other neck: Negative for adenopathy in the neck.  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  85% diameter stenosis proximal right internal carotid artery  Right vertebral artery widely patent. Mild to moderate stenosis distal left vertebral artery.  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 This is nonocclusive. There is also probable nonocclusive thrombus in the right jugular vein.   Electronically Signed   By: Franchot Gallo M.D.   On: 01/16/2015 08:41   [name]  01/15/2015   CLINICAL DATA:  Stroke.  Abnormal CT head  EXAM: MRI HEAD WITHOUT CONTRAST  MRA HEAD WITHOUT CONTRAST  TECHNIQUE: Multiplanar, multiecho pulse sequences of the brain and surrounding structures were obtained without intravenous contrast. Angiographic images of the head were obtained using MRA technique without contrast.  COMPARISON:  CT head 01/15/2015  FINDINGS: MRI HEAD FINDINGS  Negative for acute infarct. CT abnormalities in the brainstem are felt to be artifact on CT as this area appears normal on MRI. This is compatible with beam hardening artifact on CT.  Ventricle size normal.  Cerebral volume normal.  Pituitary not enlarged. Craniocervical junction normal. Paranasal sinuses are clear. Mastoid sinuses are clear.  Negative for acute or chronic ischemia. Negative for demyelinating disease.  Hyperintense signal in the left internal carotid artery compatible with occlusion.  Negative for intracranial hemorrhage.  No mass or edema identified.  MRA HEAD FINDINGS  Decreased signal distal left vertebral artery which is likely due to atherosclerotic  disease and moderate stenosis. Distal right vertebral artery patent to the basilar. AICA patent bilaterally. Basilar widely patent. Superior cerebellar and posterior cerebral arteries patent bilaterally.  Right cavernous carotid shows mild atherosclerotic disease without significant stenosis. 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 Left anterior and middle cerebral arteries are patent and supplied via the anterior communicating artery and a small left posterior communicating artery which is patent. Right posterior communicating artery also patent  Negative for cerebral aneurysm.  IMPRESSION: Negative for acute or chronic ischemic change. CT finding in the brainstem is an artifact.  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   Electronically Signed   By: Franchot Gallo M.D.   On: 01/15/2015 12:42   Mr Jodene Nam Head/brain Wo Cm  01/15/2015   CLINICAL DATA:  Stroke.  Abnormal CT head  EXAM: MRI HEAD WITHOUT CONTRAST  MRA HEAD WITHOUT CONTRAST  TECHNIQUE: Multiplanar, multiecho pulse sequences of the brain and surrounding structures were obtained without intravenous contrast. Angiographic images of the head were obtained using MRA technique without contrast.  COMPARISON:  CT head 01/15/2015  FINDINGS: MRI HEAD FINDINGS  Negative for acute infarct.  CT abnormalities in the brainstem are felt to be artifact on CT as this area appears normal on MRI. This is compatible with beam hardening artifact on CT.  Ventricle size normal.  Cerebral volume normal.  Pituitary not enlarged. Craniocervical junction normal. Paranasal sinuses are clear. Mastoid sinuses are clear.  Negative for acute or chronic ischemia. Negative for demyelinating disease.  Hyperintense signal in the left internal carotid artery compatible with occlusion.  Negative for intracranial  hemorrhage.  No mass or edema identified.  MRA HEAD FINDINGS  Decreased signal distal left vertebral artery which is likely due to atherosclerotic disease and moderate stenosis. Distal right vertebral artery patent to the basilar. AICA patent bilaterally. Basilar widely patent. Superior cerebellar and posterior cerebral arteries patent bilaterally.  Right cavernous carotid shows mild atherosclerotic disease without significant stenosis. 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 Left anterior and middle cerebral arteries are patent and supplied via the anterior communicating artery and a small left posterior communicating artery which is patent. Right posterior communicating artery also patent  Negative for cerebral aneurysm.  IMPRESSION: Negative for acute or chronic ischemic change. CT finding in the brainstem is an artifact.  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   Electronically Signed   By: Franchot Gallo M.D.   On: 01/15/2015 12:42    Scheduled Meds: . fenofibrate  160 mg Oral Daily  . nicotine  14 mg Transdermal Daily  . pantoprazole  20 mg Oral Daily  . simvastatin  20 mg Oral q1800   Continuous Infusions: . heparin 1,150 Units/hr (01/16/15 1100)    Principal Problem:   TIA (transient ischemic attack) Active Problems:   Mixed hyperlipidemia   Essential hypertension   Sleep apnea   Numbness and tingling of left arm and leg   CVA (cerebral infarction)   Carotid stenosis   Thrombus: BILATERAL EXTERNAL JUGULAR VEINS per CT angio head and neck 01/16/15    Time spent: 92 minutes    Martin Archer,Martin M.D. [facility] Pager [phone]. If 7PM-7AM, please contact night-coverage at [email or web address], password [facility] 01/16/2015, 12:35 PM  LOS: 1 day

## 2015-01-16 NOTE — Progress Notes (Signed)
[place] for heparin Indication: DVT  Allergies  Allergen Reactions  . Adderall [Amphetamine-Dextroamphet Er]     diarrhea  . Prozac [Fluoxetine Hcl]     diarrhea  . Sertraline Hcl     REACTION: diarrhea    Patient Measurements: Height: 5\' 7"  (170.2 cm) Weight: 190 lb 7.6 oz (86.4 kg) IBW/kg (Calculated) : 66.1 Heparin Dosing Weight: 84kg  Vital Signs: Temp: 98 F (36.7 C) (05/22 1755) Temp Source: Oral (05/22 1755) BP: 156/99 mmHg (05/22 1755) Pulse Rate: 78 (05/22 1755)  Labs:  Recent Labs  01/15/15 0651 01/15/15 0657 01/16/15 0341 01/16/15 1640  HGB 13.7 14.3 14.2  --   HCT 40.8 42.0 42.2  --   PLT 255  --  243  --   APTT 23*  --   --   --   LABPROT 12.7  --   --   --   INR 0.93  --   --   --   HEPARINUNFRC  --   --   --  0.14*  CREATININE 1.17 1.20 1.14  --     Estimated Creatinine Clearance: 75.9 mL/min (by C-G formula based on Cr of 1.14).   Assessment: 54 YOM brought in 5/21 AM with numbness of left arm and leg and left-sided facial drop the night before- did not come to the hospital as he thought it would improve on its own. Was not given tPA d/t delay in arrival. Carotid ultrasound performed and revealed occulusion of common carotid and internal carotid. CT angio also revealed a nonocclusive thrombus in the left external jugular vein extending into the left internal jugular vein along with probable nonocclusive thrombus in the right jugular vein.  Patient not on anticoagulation PTA. Started on heparin earlier today.  Initial heparin level low at 0.14 units/mL. Vascular planning for carotidectomy this admission. No bleeding noted  Goal of Therapy:  Heparin level 0.3-0.5 units/ml Monitor platelets by anticoagulation protocol: Yes   Plan:  -heparin NO BOLUS with recent stroke- increase gtt to 1300 units/hr -heparin level in 6 hours atr 0100 -daily HL and CBC -follow closely for any changes in mental status/  stroke symptoms -follow for long term AC plans  Jaana Brodt D. Matti Killingsworth, PharmD, BCPS Clinical Pharmacist Pager: [phone] 01/16/2015 6:30 PM

## 2015-01-17 ENCOUNTER — Encounter (HOSPITAL_COMMUNITY): Payer: Self-pay | Admitting: *Deleted

## 2015-01-17 ENCOUNTER — Encounter (HOSPITAL_COMMUNITY)
Admission: EM | Disposition: A | Discharge status: Home / Self Care | Payer: Self-pay | Source: Home / Self Care | Attending: Internal Medicine

## 2015-01-17 ENCOUNTER — Inpatient Hospital Stay (HOSPITAL_COMMUNITY): Payer: 59

## 2015-01-17 ENCOUNTER — Inpatient Hospital Stay (HOSPITAL_COMMUNITY)
Admit: 2015-01-17 | Discharge: 2015-01-17 | Disposition: A | Discharge status: Home / Self Care | Payer: 59 | Attending: Cardiology | Admitting: Cardiology

## 2015-01-17 DIAGNOSIS — I700 Atherosclerosis of aorta: Secondary | ICD-10-CM

## 2015-01-17 DIAGNOSIS — R202 Paresthesia of skin: Secondary | ICD-10-CM

## 2015-01-17 DIAGNOSIS — I639 Cerebral infarction, unspecified: Secondary | ICD-10-CM | POA: Insufficient documentation

## 2015-01-17 HISTORY — PX: TEE WITHOUT CARDIOVERSION: SHX5443

## 2015-01-17 LAB — CBC
HCT: 40.7 % (ref 39.0–52.0)
HEMOGLOBIN: 13.8 g/dL (ref 13.0–17.0)
MCH: 31.4 pg (ref 26.0–34.0)
MCHC: 33.9 g/dL (ref 30.0–36.0)
MCV: 92.7 fL (ref 78.0–100.0)
PLATELETS: 226 10*3/uL (ref 150–400)
RBC: 4.39 MIL/uL (ref 4.22–5.81)
RDW: 12.8 % (ref 11.5–15.5)
WBC: 9.5 10*3/uL (ref 4.0–10.5)

## 2015-01-17 LAB — HEPARIN LEVEL (UNFRACTIONATED)
HEPARIN UNFRACTIONATED: 0.37 [IU]/mL (ref 0.30–0.70)
HEPARIN UNFRACTIONATED: 0.3 [IU]/mL (ref 0.30–0.70)

## 2015-01-17 LAB — BASIC METABOLIC PANEL
Anion gap: 9 (ref 5–15)
Anion gap: 10 (ref 5–15)
BUN: 16 mg/dL (ref 6–20)
BUN: 15 mg/dL (ref 6–20)
CO2: 29 mmol/L (ref 22–32)
CO2: 27 mmol/L (ref 22–32)
CREATININE: 1.09 mg/dL (ref 0.61–1.24)
Calcium: 8.9 mg/dL (ref 8.9–10.3)
Calcium: 9.3 mg/dL (ref 8.9–10.3)
Chloride: 99 mmol/L — ABNORMAL LOW (ref 101–111)
Chloride: 100 mmol/L — ABNORMAL LOW (ref 101–111)
Creatinine, Ser: 1.11 mg/dL (ref 0.61–1.24)
GFR calc non Af Amer: >60 mL/min (ref >60)
GLUCOSE: 136 mg/dL — AB (ref 65–99)
GLUCOSE: 105 mg/dL — AB (ref 65–99)
Potassium: 3.9 mmol/L (ref 3.5–5.1)
Potassium: 3.7 mmol/L (ref 3.5–5.1)
SODIUM: 137 mmol/L (ref 135–145)
Sodium: 137 mmol/L (ref 135–145)

## 2015-01-17 LAB — HEMOGLOBIN A1C
HEMOGLOBIN A1C: 5.6 % (ref 4.8–5.6)
Mean Plasma Glucose: 114 mg/dL

## 2015-01-17 LAB — SEDIMENTATION RATE: SED RATE: 16 mm/h (ref 0–16)

## 2015-01-17 LAB — PROTIME-INR
INR: 1.07 (ref 0.00–1.49)
PROTHROMBIN TIME: 14.1 s (ref 11.6–15.2)

## 2015-01-17 LAB — FOLATE: FOLATE: 11.5 ng/mL (ref >5.9)

## 2015-01-17 SURGERY — ECHOCARDIOGRAM, TRANSESOPHAGEAL
Anesthesia: Moderate Sedation

## 2015-01-17 MED ORDER — SODIUM CHLORIDE 0.9 % IV SOLN: INTRAVENOUS | Status: DC

## 2015-01-17 MED ORDER — LOSARTAN POTASSIUM 50 MG PO TABS: 100.0000 mg | ORAL_TABLET | Freq: Every day | ORAL | Status: DC

## 2015-01-17 MED ORDER — HYDRALAZINE HCL 20 MG/ML IJ SOLN
5.0000 mg | Freq: Once | INTRAMUSCULAR | Status: AC
  Administered 2015-01-17: 5 mg via INTRAVENOUS
  Filled 2015-01-17: qty 1

## 2015-01-17 MED ORDER — IOHEXOL 300 MG/ML  SOLN
100.0000 mL | Freq: Once | INTRAMUSCULAR | Status: AC | PRN
  Administered 2015-01-17: 100 mL via INTRAVENOUS

## 2015-01-17 MED ORDER — BUTAMBEN-TETRACAINE-BENZOCAINE 2-2-14 % EX AERO
INHALATION_SPRAY | CUTANEOUS | Status: DC | PRN
  Administered 2015-01-17: 2 via TOPICAL

## 2015-01-17 MED ORDER — FENTANYL CITRATE (PF) 100 MCG/2ML IJ SOLN
INTRAMUSCULAR | Status: AC
  Filled 2015-01-17: qty 2

## 2015-01-17 MED ORDER — MIDAZOLAM HCL 10 MG/2ML IJ SOLN
INTRAMUSCULAR | Status: DC | PRN
  Administered 2015-01-17: 2 mg via INTRAVENOUS
  Administered 2015-01-17: 1 mg via INTRAVENOUS

## 2015-01-17 MED ORDER — FENTANYL CITRATE (PF) 100 MCG/2ML IJ SOLN
INTRAMUSCULAR | Status: DC | PRN
  Administered 2015-01-17 (×2): 25 ug via INTRAVENOUS

## 2015-01-17 MED ORDER — MIDAZOLAM HCL 5 MG/ML IJ SOLN
INTRAMUSCULAR | Status: AC
  Filled 2015-01-17: qty 2

## 2015-01-17 MED ORDER — AMLODIPINE BESYLATE 5 MG PO TABS: 5.0000 mg | ORAL_TABLET | Freq: Every day | ORAL | Status: DC

## 2015-01-17 MED ORDER — DIPHENHYDRAMINE HCL 50 MG/ML IJ SOLN
INTRAMUSCULAR | Status: AC
  Filled 2015-01-17: qty 1

## 2015-01-17 NOTE — Progress Notes (Signed)
STROKE TEAM PROGRESS NOTE   HISTORY Martin Archer is a 54 y.o. male with a past medical history significant for HTN, hyperlipidemia, s/p left CEA, OSA, gastric ulcer with hemorrhage and obstruction, diverticulosis of colon, transferred to [facility] for further evaluation of left-sided numbness and weakness. He initially presented to [facility] with complains of numbness-weakness of the left face-arm-leg last night and upon awakening today. Patient indicated that last night he noticed a numb sensation of his left face-arm-leg accompanied by weakness of arm and leg that lasted a couple of hours and subsided. (01/14/2015, time unclear). He said that his sister did notice some droopiness of the left face. Did not seek immediate medical attention, but became concerned this morning around 4 am when the symptoms came back again (LKW 01/15/2015 0400). Said that the weakness is gone but still having some numbness of the left arm and left foot. It is worth mentioning that he reports 5 or 6 similar episodes within the last couple of months. Complains of a mild HA and blurriness of vision, but denies vertigo, double vision, difficulty swallowing, slurred speech, imbalance, language impairment of confusion. CT brain was personally reviewed and showed no acute abnormality. He was not a tPA candidate as symptoms resolved.   SUBJECTIVE (INTERVAL HISTORY) No family at bedside. Patient up in chair at the bedside. On heparin drip. Reports vascular surgeon has been this am and plans surgery this Wed.   OBJECTIVE Temp:  [97.7 F (36.5 C)-98.6 F (37 C)] 97.7 F (36.5 C) (05/23 0934) Pulse Rate:  [73-82] 82 (05/23 0934) Cardiac Rhythm:  [-] Normal sinus rhythm (05/22 2015) Resp:  [17-20] 18 (05/23 0934) BP: (125-156)/(87-125) 136/102 mmHg (05/23 0934) SpO2:  [96 %-100 %] 100 % (05/23 0934)  No results for input(s): GLUCAP in the last 168 hours.  Recent Labs Lab 01/15/15 0651 01/15/15 0657 01/16/15 0341  01/17/15 0045  NA 139 140 138 137  K 4.0 4.0 3.9 3.9  CL 103 106 101 99*  CO2 25  --  28 29  GLUCOSE 127* 127* 94 105*  BUN 27* 27* 16 16  CREATININE 1.17 1.20 1.14 1.11  CALCIUM 9.5  --  9.1 8.9    Recent Labs Lab 01/15/15 0651 01/16/15 0341  AST 30 26  ALT 25 21  ALKPHOS 90 74  BILITOT 0.5 0.8  PROT 7.0 6.2*  ALBUMIN 4.1 3.5    Recent Labs Lab 01/15/15 0651 01/15/15 0657 01/16/15 0341 01/17/15 0045  WBC 9.2  --  10.3 9.5  NEUTROABS 6.1  --   --   --   HGB 13.7 14.3 14.2 13.8  HCT 40.8 42.0 42.2 40.7  MCV 94.2  --  92.7 92.7  PLT 255  --  243 226   No results for input(s): CKTOTAL, CKMB, CKMBINDEX, TROPONINI in the last 168 hours.  Recent Labs  01/15/15 0651  LABPROT 12.7  INR 0.93    Recent Labs  01/15/15 0825  COLORURINE YELLOW  LABSPEC 1.017  PHURINE 5.5  GLUCOSEU 100*  HGBUR NEGATIVE  BILIRUBINUR NEGATIVE  KETONESUR NEGATIVE  PROTEINUR NEGATIVE  UROBILINOGEN 0.2  NITRITE NEGATIVE  LEUKOCYTESUR NEGATIVE       Component Value Date/Time   CHOL 266* 01/16/2015 0341   CHOL 286* 10/12/2013 0436   TRIG 407* 01/16/2015 0341   TRIG 572* 10/12/2013 0436   TRIG 284* 07/15/2006 1158   HDL 37* 01/16/2015 0341   HDL 35* 10/12/2013 0436   CHOLHDL 7.2 01/16/2015 0341   CHOLHDL 5.9  CALC 07/15/2006 1158   VLDL UNABLE TO CALCULATE IF TRIGLYCERIDE OVER 400 mg/dL 01/16/2015 0341   LDLCALC UNABLE TO CALCULATE IF TRIGLYCERIDE OVER 400 mg/dL 01/16/2015 0341   LDLCALC NOT CALC 10/12/2013 0436   Lab Results  Component Value Date   HGBA1C 5.4 12/08/2014      Component Value Date/Time   LABOPIA POSITIVE* 01/15/2015 0825   COCAINSCRNUR NONE DETECTED 01/15/2015 0825   LABBENZ NONE DETECTED 01/15/2015 0825   AMPHETMU NONE DETECTED 01/15/2015 0825   THCU POSITIVE* 01/15/2015 0825   LABBARB NONE DETECTED 01/15/2015 0825     Recent Labs Lab 01/15/15 0651  ETH <5    Dg Chest 2 View 01/15/2015    No acute cardiopulmonary process.  Possible 7 mm right  lower lobe nodule. Recommend correlation with chest CT in the non acute setting.     Ct Head Wo Contrast 01/15/2015    1. Possible ischemic changes in the pons, midbrain, and medulla without associated hemorrhage.  2. Age advanced mild bilateral carotid siphon atherosclerosis.  3. MRI of the brain without contrast may be helpful in further evaluation to confirm or deny the above findings.     MRI / MRA Brain Wo Contrast 01/15/2015    Negative for acute or chronic ischemic change.  CT finding in the brainstem is an artifact.   Occluded left internal carotid artery of indeterminate age   Moderately severe stenosis distal left vertebral artery.     CT angiogram of the head and neck 01/16/2015 1. Occlusion of the left common carotid artery and left internal carotid artery from the arch through the skullbase. 2. 85% diameter stenosis proximal right internal carotid artery 3. Right vertebral artery widely patent. Mild to moderate stenosis distal left vertebral artery. 4. 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 This is nonocclusive. There is also probable nonocclusive thrombus in the right jugular vein.  Carotid duplex 01/15/2015 Right > 80% ICA stenosis at the level of the distal bulb into the proximal ICA.  Left - There appears to be an occlusion of the common carotid and Internal carotid artery.  The right vertebral artery is antegrade and the left carotid artery has a " bunny sign " consistent with a more proximal stenosis  2D echo - Procedure narrative: Transthoracic echocardiography. Imagequality was adequate. The study was technically difficult, as aresult of poor acoustic windows and poor sound wave transmission. - Left ventricle: The cavity size was normal. Systolic function wasnormal. The estimated ejection fraction was in the range of 55%to 60%. Wall motion was normal; there were no regional wallmotion abnormalities. Doppler  parameters are consistent withabnormal left ventricular relaxation (grade 1 diastolicdysfunction). Doppler parameters are consistent with highventricular filling pressure. Mild to moderate concentric leftventricular hypertrophy. - Aortic valve: Mildly to moderately calcified annulus. Mildlythickened, mildly calcified leaflets. - Aorta: MIld aortic root dilatation. Aortic root dimension: 41 mm(ED). - Mitral valve: Mildly calcified annulus. Mildly thickened leaflets. There was mild regurgitation. - Left atrium: The atrium was severely dilated. Volume/bsa, ES,(1-plane Simpson&'s, A2C): 41 ml/m^2.  UE and LE venous doppler pending   TEE pending   Hypercoagulable work up pending   Pan CT pending   PHYSICAL EXAM  Temp:  [97.7 F (36.5 C)-98.6 F (37 C)] 97.7 F (36.5 C) (05/23 0934) Pulse Rate:  [73-82] 82 (05/23 0934) Resp:  [17-20] 18 (05/23 0934) BP: (125-156)/(87-125) 136/102 mmHg (05/23 0934) SpO2:  [96 %-100 %] 100 % (05/23 0934)  General - Well  nourished, well developed, in no apparent distress.  Ophthalmologic - Sharp disc margins OU.   Cardiovascular - Regular rate and rhythm with no murmur.  Mental Status -  Level of arousal and orientation to time, place, and person were intact. Language including expression, naming, repetition, comprehension was assessed and found intact. Attention span and concentration were normal. Recent and remote memory were intact. Fund of Knowledge was assessed and was intact.  Cranial Nerves II - XII - II - Visual field intact OU. III, IV, VI - Extraocular movements intact. V - Facial sensation intact bilaterally. VII - Facial movement intact bilaterally. VIII - Hearing & vestibular intact bilaterally. X - Palate elevates symmetrically. XI - Chin turning & shoulder shrug intact bilaterally. XII - Tongue protrusion intact.  Motor Strength - The patient's strength was normal in all extremities and pronator drift was absent.  Bulk was  normal and fasciculations were absent.   Motor Tone - Muscle tone was assessed at the neck and appendages and was normal.  Reflexes - The patient's reflexes were 1+ in all extremities and he had no pathological reflexes.  Sensory - Light touch, temperature/pinprick, vibration and proprioception, and Romberg testing were assessed and were symmetrical.    Coordination - The patient had normal movements in the hands and feet with no ataxia or dysmetria.  Tremor was absent.  Gait and Station - The patient's transfers, posture, gait, station, and turns were observed as normal.  ASSESSMENT/PLAN Mr. Om Barna Yonker is a 54 y.o. male with history of hypertension, ongoing tobacco use, hyperlipidemia, status post left carotid endarterectomy, obstructive sleep apnea, gastric ulcer with hemorrhage and obstruction, and diverticulosis presenting with multiple transient episodes of left-sided numbness and weakness as well as a mild headache, facial droop, and blurred vision. He did not receive IV t-PA due to late presentation.  TIA:  Non-dominant - right hemisphere  Resultant  resolved  MRI  No acute infarct  MRA  Left ICA occlusion  Carotid Doppler  Right >80% ICA stenosis  2-D echocardiogram no source of embolus  CTA showed left CCA occlusion, right ICA 85%, bilateral jugular vein thrombus  TEE today at 3p  LDL - unable to calculate secondary to triglycerides greater than 400  HgbA1c pending   Heparin for VTE prophylaxis Diet NPO time specified Except for: Sips with Meds  no antithrombotic prior to admission, now on heparin  Ongoing aggressive stroke risk factor management  Therapy recommendations: No follow-up PT  Disposition: Pending  Bilateral jugular vein thrombosis  Etiology not clear  Check venous doppler of LEs and UEs pending   Hypercoagulable work up thus far negative, workup pending  pan CT to rule out malignancy pending   On heparin drip  IV heparin per  pharmacy protocol for thrombus  Carotid occlusion/stenosis  Left CCA occlusion  S/p left ICA CEA but no occluded - likely due to emboli instead of plaque formation  Right ICA 85% stenosis  Vascular surgery consulted for right internal carotid artery stenosis - plans R CEA next Wed by Dr. Kellie Simmering  On heparin drip   Hypertension  Home meds: Norvasc, losartan, and metoprolol  Blood pressure is running mildly high; however, patient needs elevated BP to maintain perfusion.  Antihypertensive medications on hold  BP goal 130-150 due to carotid occlusion/stenosis  Hyperlipidemia  Home meds:  No lipid lowering medications prior to admission  LDL unable to calculate, goal < 70  Zocor and fenofibrate added  Continue statin at discharge  Diabetes  HgbA1c pending, goal < 7.0  Controlled  Tobacco abuse  Current smoker  Smoking cessation counseling provided  Nicotine patch provided  Pt is willing to quit  Other Stroke Risk Factors  Cigarette smoker, advised to stop smoking  UDS positive for opiates and THC. On Vicodin.  Obesity, Body mass index is 29.83 kg/(m^2).   Family hx stroke (father and uncle)  OSA - not on CPAP  Other Active Problems  Back pain - Vicodin  Diffuse thrombus  PLAN  Check hypercoagulable panel  Rule out malignancy - possible nodule on chest x-ray.  CT of chest abdomen and pelvis to rule out malignancy as source of thrombus  TEE planned for Monday - cardiology aware.  Smoking cessation - nicotine patches.  Permissive hypertension in the setting of carotid stenosis.  Upper and Lower extremity venous Dopplers  Treat mixed hyperlipidemia - Zocor and fenofibrate  Hospital day # 2  54 yo M with hx of left CEA 8 years ago, HTN, HLD, smoker presented with TIA with left sided symptoms. Stroke w/u found to have left CCA occlusion, right ICA 85% stenosis, bilateral jugular vein thrombosis. Put on heparin drip. Need to have further work  up with LE and UE venous doppler, TEE, pan CT. Mixed HLD put on statin and fibrates. Hypercoagulable work up also placed. Dr. Kellie Simmering has seen pt and will do right CEA on Wednesday.  Rosalin Hawking, MD PhD Stroke Neurology 01/17/2015 2:12 PM  To contact Stroke Continuity provider, please refer to [email or web address] After hours, contact General Neurology

## 2015-01-17 NOTE — Progress Notes (Deleted)
Echocardiogram Echocardiogram Transesophageal has been performed.  Martin Archer 01/17/2015, 3:30 PM

## 2015-01-17 NOTE — Progress Notes (Signed)
Echocardiogram Echocardiogram Transesophageal has been performed.  Martin Archer 01/17/2015, 3:45 PM

## 2015-01-17 NOTE — Op Note (Signed)
INDICATIONS: stroke  PROCEDURE:   Informed consent was obtained prior to the procedure. The risks, benefits and alternatives for the procedure were discussed and the patient comprehended these risks.  Risks include, but are not limited to, cough, sore throat, vomiting, nausea, somnolence, esophageal and stomach trauma or perforation, bleeding, low blood pressure, aspiration, pneumonia, infection, trauma to the teeth and death.    After a procedural time-out, the oropharynx was anesthetized with 20% benzocaine spray. The patient was given 3 mg versed and 50 mcg fentanyl for moderate sedation.   The transesophageal probe was inserted in the esophagus and stomach without difficulty and multiple views were obtained.  The patient was kept under observation until the patient left the procedure room.  The patient left the procedure room in stable condition.   Agitated microbubble saline contrast was administered.  COMPLICATIONS:    There were no immediate complications.  FINDINGS:  Normal LV function, mild to moderate LVH. No intracardiac mass/thrombus/vegetation. No right to left intracardiac shunt. Severe atherosclerosis of the aortic arch. A particularly large ulcerated plaque is seen in the distal arch, across from the origin of the arch vessels. There is a large and highly mobile component, likely source of embolic events.  RECOMMENDATIONS:     Aggressive lowering of serum cholesterol levels and treatment of other atherosclerosis risk factors..  Time Spent Directly with the Patient:  60 minutes   Anjulie Dipierro 01/17/2015, 3:16 PM

## 2015-01-17 NOTE — Progress Notes (Signed)
[facility] PROGRESS NOTE  Martin Archer [id number] DOB: [birth year] DOA: 01/15/2015 PCP: Unice Cobble, MD  Assessment/Plan: #1 probable TIA Clinical improvement. Symptoms resolved. CT head negative. MRI head no acute infarct. MRA head left ICA occlusion. Carotid Dopplers with greater than 80% right ICA stenosis. 2-D echo with normal EF with severely dilated left atrium. No source of emboli noted. CT angiogram showed left CCA occlusion, right ICA 85% stenosis, bilateral jugular venous thrombosis. LDL was unable to be Kiker secondary to triglycerides greater than 400. Hemoglobin A1c pending. Patient was not on any antithrombotic agent prior to admission. Patient currently on IV heparin for secondary stroke prophylaxis. TEE with normal LV function mild to moderate LVH, no intracardiac mass/thrombus/vegetation, no right-to-left intracardiac shunt. Severe atherosclerosis of the aortic arch. Particularly large ulcerated plaque seen in the distal arch across from the origin of the arch vessels. Large and highly mobile component likely source of embolic events. Lower extremity Dopplers and upper extremity Dopplers pending. Patient has been seen by vascular surgery and patient for right carotid endarterectomy on Wednesday. Neurology following and appreciate input and recommendation.  #2 bilateral jugular vein thrombosis Unknown etiology. Lower extremity and upper extremity Dopplers have been ordered per neurology. Hypercoagulable panel pending. CT chest and abdomen and pelvis negative for acute abnormality. Patient has been started on IV heparin drip per neurology. TEE with normal LV function mild to moderate LVH, no intracardiac mass/thrombus/vegetation, no right-to-left intracardiac shunt. Severe atherosclerosis of the aortic arch. Particularly large ulcerated plaque seen in the distal arch across from the origin of the arch vessels. Large and highly mobile component likely source of embolic  events.    #3 hyperlipidemia LDL unable to be calculated secondary to triglycerides greater than 400. Continue fenofibrate and statin. Follow.  #4 carotid occlusion/stenosis Patient with a left CCA occlusion. Patient with a right ICA 85% stenosis. Patient has been started on IV heparin. Consult with vascular surgery.  #5 hypertension Blood pressure slightly high. Continue to hold antihypertensive medications for now. Goal blood pressure systolic [id number] to [id number] secondary to carotid occlusion/stenosis.  #6 tobacco abuse  tobacco cessation. Continue nicotine patch.  #7 chronic back pain Continue home pain regimen.  #8 prophylaxis On heparin for DVT prophylaxis.   Code Status: Full Family Communication: Updated patient and wife at bedside. Disposition Plan: Pending stroke evaluation probably home.   Consultants:  Neurology: Dr. Aram Beecham 01/15/2015    Procedures:  2-D echo 01/16/2015  CT head 01/15/2015  CT angiogram neck 01/16/2015  Carotid Dopplers 01/15/2015  MRI head MRA head 01/15/2015  TEE 01/17/15  CT chest abdomen and pelvis 01/17/2015  Antibiotics:  None  HPI/Subjective: Patient states numbness has improved. Patient recently returned from TEE. No complaints.  Objective: Filed Vitals:   01/17/15 1740  BP: 130/97  Pulse: 95  Temp: 98.1 F (36.7 C)  Resp: 18    Intake/Output Summary (Last 24 hours) at 01/17/15 1837 Last data filed at 01/17/15 1700  Gross per 24 hour  Intake    360 ml  Output      0 ml  Net    360 ml   Filed Weights   01/16/15 1000 01/16/15 1044  Weight: 88.9 kg (195 lb 15.8 oz) 86.4 kg (190 lb 7.6 oz)    Exam:   General:  NAD  Cardiovascular: RRR  Respiratory: CTAB  Abdomen: Soft, nontender, nondistended, positive bowel sounds.  Musculoskeletal: No clubbing cyanosis or edema.  Data Reviewed: Basic Metabolic Panel:  Recent Labs Lab 01/15/15 [phone]  01/15/15 0657 01/16/15 0341 01/17/15 0045  NA 139 140 138 137   K 4.0 4.0 3.9 3.9  CL 103 106 101 99*  CO2 25  --  28 29  GLUCOSE 127* 127* 94 105*  BUN 27* 27* 16 16  CREATININE 1.17 1.20 1.14 1.11  CALCIUM 9.5  --  9.1 8.9   Liver Function Tests:  Recent Labs Lab 01/15/15 0651 01/16/15 0341  AST 30 26  ALT 25 21  ALKPHOS 90 74  BILITOT 0.5 0.8  PROT 7.0 6.2*  ALBUMIN 4.1 3.5   No results for input(s): LIPASE, AMYLASE in the last 168 hours. No results for input(s): AMMONIA in the last 168 hours. CBC:  Recent Labs Lab 01/15/15 0651 01/15/15 0657 01/16/15 0341 01/17/15 0045  WBC 9.2  --  10.3 9.5  NEUTROABS 6.1  --   --   --   HGB 13.7 14.3 14.2 13.8  HCT 40.8 42.0 42.2 40.7  MCV 94.2  --  92.7 92.7  PLT 255  --  243 226   Cardiac Enzymes: No results for input(s): CKTOTAL, CKMB, CKMBINDEX, TROPONINI in the last 168 hours. BNP (last 3 results) No results for input(s): BNP in the last 8760 hours.  ProBNP (last 3 results) No results for input(s): PROBNP in the last 8760 hours.  CBG: No results for input(s): GLUCAP in the last 168 hours.  No results found for this or any previous visit (from the past 240 hour(s)).   Studies: Ct Angio Neck W/cm &/or Wo/cm  01/16/2015   CLINICAL DATA:  Right brain TIA  EXAM: CT ANGIOGRAPHY NECK  TECHNIQUE: Multidetector CT imaging of the neck was performed using the standard protocol during bolus administration of intravenous contrast. Multiplanar CT image reconstructions and MIPs were obtained to evaluate the vascular anatomy. Carotid stenosis measurements (when applicable) are obtained utilizing NASCET criteria, using the distal internal carotid diameter as the denominator.  CONTRAST:  30mL OMNIPAQUE IOHEXOL 350 MG/ML SOLN  COMPARISON:  MRI/MRA head 01/15/2015  FINDINGS: Aortic arch: Mild atherosclerotic aortic arch. Lung apices are clear.  Right carotid system: Right common carotid artery is patent with mild atherosclerotic disease. There is calcified and noncalcified plaque in the carotid bulb.  Critical stenosis measuring 85% diameter stenosis of the proximal right internal carotid artery . External carotid artery widely patent. Right internal carotid artery patent to the skullbase.  Left carotid system: Left common carotid artery is occluded at the aortic arch. Left internal carotid artery is occluded through the skullbase with intracranial reconstitution. See MRA report from yesterday.  Vertebral arteries:Right vertebral artery widely patent to the basilar without stenosis.  Left vertebral artery demonstrates a mild to moderate stenosis at the level the foramen magnum, less severe than that suggested on MRA. There is also mild stenosis of the distal left vertebral artery before the basilar.  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 This is nonocclusive. There is also filling defect in the right internal jugular vein felt to be thrombus, less likely non-opacified blood mixing with opacified blood.  Skeleton: No acute bony change.  Other neck: Negative for adenopathy in the neck.  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  85% diameter stenosis proximal right internal carotid artery  Right vertebral artery widely patent. Mild to moderate stenosis distal left vertebral artery.  Thrombus in the left external jugular vein extending into the  left internal jugular vein. This is nonocclusive. There is also probable nonocclusive thrombus in the right jugular vein.   Electronically Signed   By: Franchot Gallo M.D.   On: 01/16/2015 08:41   Ct Chest W Contrast  01/17/2015   CLINICAL DATA:  Right lower lobe pulmonary nodule. Long-term smoking history.  EXAM: CT CHEST, ABDOMEN, AND PELVIS WITH CONTRAST  TECHNIQUE: Multidetector CT imaging of the chest, abdomen and pelvis was performed following the standard protocol during bolus administration of intravenous contrast.   CONTRAST:  168mL OMNIPAQUE IOHEXOL 300 MG/ML  SOLN  COMPARISON:  02/10/2010 CT abdomen/pelvis, chest radiograph 01/15/2015, neck CTA 01/16/2015  FINDINGS: CT CHEST FINDINGS  Mediastinum/Nodes: Left common carotid arterial occlusion reidentified. Heart size is normal. No pericardial effusion. Mild atheromatous aortic and coronary arterial calcification. No lymphadenopathy.  Lungs/Pleura: No pulmonary parenchymal nodule, mass, or consolidation is identified. No specific poorly to the previously questioned finding, possibly previously representing nipple shadow. No pleural effusion.  Upper abdomen: Please see dedicated report below.  Musculoskeletal: No acute osseous abnormality.  CT ABDOMEN AND PELVIS FINDINGS  Lower chest:  Please see dedicated report above.  Hepatobiliary: Liver and gallbladder appear normal.  Pancreas: Normal  Spleen: Normal  Adrenals/Urinary Tract: Adrenal glands are unremarkable. Left renal cortical scarring noted. Right kidney is unremarkable. No perinephric fluid or hydroureteronephrosis. No radiopaque renal, ureteral, or bladder calculus.  Stomach/Bowel: Normal-appearing appendix. No bowel wall thickening or focal segmental dilatation is identified. Colonic diverticuli noted without evidence for diverticulitis.  Vascular/Lymphatic: Moderate atheromatous aortic calcification without aneurysm. No lymphadenopathy.  Other: No free air or fluid.  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 1.6 cm anterolisthesis L5 on S1. Remote appearing sclerotic L5 compression deformity posteriorly approximately 30 percent vertebral body height. No acute osseous abnormality.  IMPRESSION: No CT correlate to the previously questioned right lower lobe pulmonary parenchymal nodule, which may have represented nipple shadow or something overlying the patient.  No acute abnormality within the chest, abdomen, or pelvis.   Electronically Signed   By: Conchita Paris M.D.   On: 01/17/2015 12:55    Ct Abdomen Pelvis W Contrast  01/17/2015   CLINICAL DATA:  Right lower lobe pulmonary nodule. Long-term smoking history.  EXAM: CT CHEST, ABDOMEN, AND PELVIS WITH CONTRAST  TECHNIQUE: Multidetector CT imaging of the chest, abdomen and pelvis was performed following the standard protocol during bolus administration of intravenous contrast.  CONTRAST:  169mL OMNIPAQUE IOHEXOL 300 MG/ML  SOLN  COMPARISON:  02/10/2010 CT abdomen/pelvis, chest radiograph 01/15/2015, neck CTA 01/16/2015  FINDINGS: CT CHEST FINDINGS  Mediastinum/Nodes: Left common carotid arterial occlusion reidentified. Heart size is normal. No pericardial effusion. Mild atheromatous aortic and coronary arterial calcification. No lymphadenopathy.  Lungs/Pleura: No pulmonary parenchymal nodule, mass, or consolidation is identified. No specific poorly to the previously questioned finding, possibly previously representing nipple shadow. No pleural effusion.  Upper abdomen: Please see dedicated report below.  Musculoskeletal: No acute osseous abnormality.  CT ABDOMEN AND PELVIS FINDINGS  Lower chest:  Please see dedicated report above.  Hepatobiliary: Liver and gallbladder appear normal.  Pancreas: Normal  Spleen: Normal  Adrenals/Urinary Tract: Adrenal glands are unremarkable. Left renal cortical scarring noted. Right kidney is unremarkable. No perinephric fluid or hydroureteronephrosis. No radiopaque renal, ureteral, or bladder calculus.  Stomach/Bowel: Normal-appearing appendix. No bowel wall thickening or focal segmental dilatation is identified. Colonic diverticuli noted without evidence for diverticulitis.  Vascular/Lymphatic: Moderate atheromatous aortic calcification without aneurysm. No lymphadenopathy.  Other: No free air or fluid.  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 1.6 cm anterolisthesis L5 on S1. Remote appearing sclerotic L5 compression deformity posteriorly approximately 30 percent vertebral body height.  No acute osseous abnormality.  IMPRESSION: No CT correlate to the previously questioned right lower lobe pulmonary parenchymal nodule, which may have represented nipple shadow or something overlying the patient.  No acute abnormality within the chest, abdomen, or pelvis.   Electronically Signed   By: Conchita Paris M.D.   On: 01/17/2015 12:55    Scheduled Meds: . fenofibrate  160 mg Oral Daily  . nicotine  14 mg Transdermal Daily  . pantoprazole  20 mg Oral Daily  . simvastatin  20 mg Oral q1800   Continuous Infusions: . heparin 1,300 Units/hr (01/17/15 0706)    Principal Problem:   TIA (transient ischemic attack) Active Problems:   Mixed hyperlipidemia   Essential hypertension   Sleep apnea   Numbness and tingling of left arm and leg   CVA (cerebral infarction)   Carotid stenosis   Thrombus: BILATERAL EXTERNAL JUGULAR VEINS per CT angio head and neck 01/16/15   DVT (deep venous thrombosis)   Stroke   Aortic arch atherosclerosis    Time spent: 24 minutes    THOMPSON,Caymen M.D. [facility] Pager [phone]. If 7PM-7AM, please contact night-coverage at [email or web address], password [facility] 01/17/2015, 6:37 PM  LOS: 2 days

## 2015-01-17 NOTE — Progress Notes (Signed)
ANTICOAGULATION CONSULT NOTE - FOLLOW UP  Pharmacy Consult:  Jeparin Indication: DVT in the left jugular vein  Allergies  Allergen Reactions  . Adderall [Amphetamine-Dextroamphet Er]     diarrhea  . Prozac [Fluoxetine Hcl]     diarrhea  . Sertraline Hcl     REACTION: diarrhea    Patient Measurements: Height: 5\' 7"  (170.2 cm) Weight: 190 lb 7.6 oz (86.4 kg) IBW/kg (Calculated) : 66.1 Heparin Dosing Weight: 84 kg  Vital Signs: Temp: 98 F (36.7 C) (05/23 0525) Temp Source: Oral (05/23 0525) BP: 132/101 mmHg (05/23 0525) Pulse Rate: 81 (05/23 0525)  Labs:  Recent Labs  01/15/15 0651 01/15/15 0657 01/16/15 0341 01/16/15 1640 01/17/15 0045 01/17/15 0533  HGB 13.7 14.3 14.2  --  13.8  --   HCT 40.8 42.0 42.2  --  40.7  --   PLT 255  --  243  --  226  --   APTT 23*  --   --   --   --   --   LABPROT 12.7  --   --   --   --   --   INR 0.93  --   --   --   --   --   HEPARINUNFRC  --   --   --  0.14* 0.30 0.37  CREATININE 1.17 1.20 1.14  --  1.11  --     Estimated Creatinine Clearance: 78 mL/min (by C-G formula based on Cr of 1.11).     Assessment: 5 YOM presented on 01/15/15 with numbness of the left arm and leg, and left-sided facial droop the night before.  Carotid ultrasound revealed occulusion of common carotid and internal carotid. CT angio also revealed a non-occlusive thrombus in the left external jugular vein extending into the left internal jugular vein along with probable non-occlusive thrombus in the right jugular vein.  Patient to continue on IV heparin.  Heparin level therapeutic and no bleeding reported. CBC stable.   Goal of Therapy:  Heparin level 0.3-0.5 units/ml Monitor platelets by anticoagulation protocol: Yes    Plan:  - Continue heparin gtt at 1300 units/hr - Daily HL / CBC - F/U long-term AC, resume home meds as able, nutrition    Pearline Yerby D. Mina Marble, PharmD, BCPS Pager:  [phone] 01/17/2015, 7:21 AM

## 2015-01-17 NOTE — Progress Notes (Deleted)
Received pt from the ed; patient ambulated into the room and used the restroom; Patients v/s were stable and patient had no complaints of pain or distress.  Patient was placed on bedrest but patient is noncompliant with this order and continues to ambulate around the room unpacking his things and using the phone.  Hygiene was offered and refused by patient.  Patient was oriented to the room and floor.

## 2015-01-17 NOTE — Interval H&P Note (Signed)
History and Physical Interval Note:  01/17/2015 2:16 PM  Martin Archer  has presented today for surgery, with the diagnosis of stroke  The various methods of treatment have been discussed with the patient and family. After consideration of risks, benefits and other options for treatment, the patient has consented to  Procedure(s): TRANSESOPHAGEAL ECHOCARDIOGRAM (TEE) (N/A) as a surgical intervention .  The patient's history has been reviewed, patient examined, no change in status, stable for surgery.  I have reviewed the patient's chart and labs.  Questions were answered to the patient's satisfaction.     [name]

## 2015-01-17 NOTE — Progress Notes (Signed)
Chaplain responded to referral from nurse. Pt and chaplain explored feelings of fear and uncertainty. Pt cares very deeply about his two daughters and they are at the forefront of his concerns. Chaplain and pt also explored concept of God, and coping strategies. Chaplain offered prayer. Chaplain will continue to follow. Page chaplain if needed before follow up.   01/17/15 1200  Clinical Encounter Type  Visited With Patient  Visit Type Initial;Spiritual support  Referral From Nurse  Recommendations Follow Up  Spiritual Encounters  Spiritual Needs Emotional;Prayer  Stress Factors  Patient Stress Factors Major life changes  Blayre Papania, Barbette Hair, Chaplain 01/17/2015 12:52 PM

## 2015-01-17 NOTE — Progress Notes (Addendum)
ANTICOAGULATION CONSULT NOTE- follow up  Pharmacy Consult for heparin Indication: DVT in the left jugular vein  Allergies  Allergen Reactions  . Adderall [Amphetamine-Dextroamphet Er]     diarrhea  . Prozac [Fluoxetine Hcl]     diarrhea  . Sertraline Hcl     REACTION: diarrhea    Patient Measurements: Height: 5\' 7"  (170.2 cm) Weight: 190 lb 7.6 oz (86.4 kg) IBW/kg (Calculated) : 66.1 Heparin Dosing Weight: 84kg  Vital Signs: Temp: 97.7 F (36.5 C) (05/23 0133) Temp Source: Oral (05/23 0133) BP: 138/108 mmHg (05/23 0133) Pulse Rate: 73 (05/23 0133)  Labs:  Recent Labs  01/15/15 0651 01/15/15 0657 01/16/15 0341 01/16/15 1640 01/17/15 0045  HGB 13.7 14.3 14.2  --  13.8  HCT 40.8 42.0 42.2  --  40.7  PLT 255  --  243  --  226  APTT 23*  --   --   --   --   LABPROT 12.7  --   --   --   --   INR 0.93  --   --   --   --   HEPARINUNFRC  --   --   --  0.14* 0.30  CREATININE 1.17 1.20 1.14  --   --     Estimated Creatinine Clearance: 75.9 mL/min (by C-G formula based on Cr of 1.14).   Assessment: 54 YOM brought in 5/21 AM with numbness of left arm and leg and left-sided facial drop the night before- did not come to the hospital as he thought it would improve on its own. Was not given tPA d/t delay in arrival. Carotid ultrasound performed and revealed occulusion of common carotid and internal carotid. CT angio also revealed a nonocclusive thrombus in the left external jugular vein extending into the left internal jugular vein along with probable nonocclusive thrombus in the right jugular vein.  Patient not on anticoagulation PTA. Started on heparin for DVT left jugular vein on 01/16/15. Vascular planning for carotidectomy this admission.  6 hour heparin level = 0.3 on 1300 units/hr. No bleeding noted  Goal of Therapy:  Heparin level 0.3-0.5 units/ml Monitor platelets by anticoagulation protocol: Yes   Plan:  -heparin NO BOLUS with recent stroke-  Continue heparin  drip at 1300 units/hr F/u daily HL to confirm therapeutic and daily CBC -follow closely for any changes in mental status/ stroke symptoms -follow for long term [facility] plans  Nicole Cella, RPh Clinical Pharmacist Pager: [phone] 01/17/2015 1:41 AM

## 2015-01-17 NOTE — Progress Notes (Signed)
Patient ID: Martin Archer, male   DOB: May 01, 1961, 54 y.o.   MRN: [id number] Vascular Surgery Progress Note  Subjective: Patient with multiple TIAs over the last few months. Sometimes he describes transient weakness on the right side and sometimes he describes transient weakness on the left side. Also has described some left visual changes at time. Symptoms always been completely reversed. Patient had left carotid endarterectomy by me in 2008. He returned in 2009 for follow-up with widely patent left carotid system and has never returned sense. Has heavy tobacco abuse history pack a day for 40+ years. No coronary artery disease by history and no cardiac symptoms.  Objective:  Filed Vitals:   01/17/15 0525  BP: 132/101  Pulse: 81  Temp: 98 F (36.7 C)  Resp: 17    General alert and oriented 3 Neurologic exam today is normal 3+ carotid pulse palpable absent left carotid pulse.   Labs:  Recent Labs Lab 01/15/15 0657 01/16/15 0341 01/17/15 0045  CREATININE 1.20 1.14 1.11    Recent Labs Lab 01/15/15 0651 01/15/15 0657 01/16/15 0341 01/17/15 0045  NA 139 140 138 137  K 4.0 4.0 3.9 3.9  CL 103 106 101 99*  CO2 25  --  28 29  BUN 27* 27* 16 16  CREATININE 1.17 1.20 1.14 1.11  GLUCOSE 127* 127* 94 105*  CALCIUM 9.5  --  9.1 8.9    Recent Labs Lab 01/15/15 0651 01/15/15 0657 01/16/15 0341 01/17/15 0045  WBC 9.2  --  10.3 9.5  HGB 13.7 14.3 14.2 13.8  HCT 40.8 42.0 42.2 40.7  PLT 255  --  243 226    Recent Labs Lab 01/15/15 0651  INR 0.93       Imaging: Dg Chest 2 View  01/15/2015   CLINICAL DATA:  Patient with left body numbness and dizziness.  EXAM: CHEST  2 VIEW  COMPARISON:  Chest radiograph 12/04/2006  FINDINGS: Monitoring leads overlie the patient. Cardiac contours upper limits of normal. No consolidative pulmonary opacities. Possible 7 mm right lower lobe nodule. No pleural effusion or pneumothorax. Regional skeleton is unremarkable.  IMPRESSION:  No acute cardiopulmonary process.  Possible 7 mm right lower lobe nodule. Recommend correlation with chest CT in the non acute setting.   Electronically Signed   By: Lovey Newcomer M.D.   On: 01/15/2015 16:02   Ct Angio Neck W/cm &/or Wo/cm  01/16/2015   CLINICAL DATA:  Right brain TIA  EXAM: CT ANGIOGRAPHY NECK  TECHNIQUE: Multidetector CT imaging of the neck was performed using the standard protocol during bolus administration of intravenous contrast. Multiplanar CT image reconstructions and MIPs were obtained to evaluate the vascular anatomy. Carotid stenosis measurements (when applicable) are obtained utilizing NASCET criteria, using the distal internal carotid diameter as the denominator.  CONTRAST:  74mL OMNIPAQUE IOHEXOL 350 MG/ML SOLN  COMPARISON:  MRI/MRA head 01/15/2015  FINDINGS: Aortic arch: Mild atherosclerotic aortic arch. Lung apices are clear.  Right carotid system: Right common carotid artery is patent with mild atherosclerotic disease. There is calcified and noncalcified plaque in the carotid bulb. Critical stenosis measuring 85% diameter stenosis of the proximal right internal carotid artery . External carotid artery widely patent. Right internal carotid artery patent to the skullbase.  Left carotid system: Left common carotid artery is occluded at the aortic arch. Left internal carotid artery is occluded through the skullbase with intracranial reconstitution. See MRA report from yesterday.  Vertebral arteries:Right vertebral artery widely patent to the basilar without stenosis.  Left vertebral artery demonstrates a mild to moderate stenosis at the level the foramen magnum, less severe than that suggested on MRA. There is also mild stenosis of the distal left vertebral artery before the basilar.  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 This is nonocclusive. There is also filling defect in the right internal jugular vein felt to be  thrombus, less likely non-opacified blood mixing with opacified blood.  Skeleton: No acute bony change.  Other neck: Negative for adenopathy in the neck.  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  85% diameter stenosis proximal right internal carotid artery  Right vertebral artery widely patent. Mild to moderate stenosis distal left vertebral artery.  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 This is nonocclusive. There is also probable nonocclusive thrombus in the right jugular vein.   Electronically Signed   By: Franchot Gallo M.D.   On: 01/16/2015 08:41   [name]  01/15/2015   CLINICAL DATA:  Stroke.  Abnormal CT head  EXAM: MRI HEAD WITHOUT CONTRAST  MRA HEAD WITHOUT CONTRAST  TECHNIQUE: Multiplanar, multiecho pulse sequences of the brain and surrounding structures were obtained without intravenous contrast. Angiographic images of the head were obtained using MRA technique without contrast.  COMPARISON:  CT head 01/15/2015  FINDINGS: MRI HEAD FINDINGS  Negative for acute infarct. CT abnormalities in the brainstem are felt to be artifact on CT as this area appears normal on MRI. This is compatible with beam hardening artifact on CT.  Ventricle size normal.  Cerebral volume normal.  Pituitary not enlarged. Craniocervical junction normal. Paranasal sinuses are clear. Mastoid sinuses are clear.  Negative for acute or chronic ischemia. Negative for demyelinating disease.  Hyperintense signal in the left internal carotid artery compatible with occlusion.  Negative for intracranial hemorrhage.  No mass or edema identified.  MRA HEAD FINDINGS  Decreased signal distal left vertebral artery which is likely due to atherosclerotic disease and moderate stenosis. Distal right vertebral artery patent to the basilar. AICA patent bilaterally. Basilar widely patent. Superior  cerebellar and posterior cerebral arteries patent bilaterally.  Right cavernous carotid shows mild atherosclerotic disease without significant stenosis. 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 Left anterior and middle cerebral arteries are patent and supplied via the anterior communicating artery and a small left posterior communicating artery which is patent. Right posterior communicating artery also patent  Negative for cerebral aneurysm.  IMPRESSION: Negative for acute or chronic ischemic change. CT finding in the brainstem is an artifact.  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   Electronically Signed   By: Franchot Gallo M.D.   On: 01/15/2015 12:42   Mr Jodene Nam Head/brain Wo Cm  01/15/2015   CLINICAL DATA:  Stroke.  Abnormal CT head  EXAM: MRI HEAD WITHOUT CONTRAST  MRA HEAD WITHOUT CONTRAST  TECHNIQUE: Multiplanar, multiecho pulse sequences of the brain and surrounding structures were obtained without intravenous contrast. Angiographic images of the head were obtained using MRA technique without contrast.  COMPARISON:  CT head 01/15/2015  FINDINGS: MRI HEAD FINDINGS  Negative for acute infarct. CT abnormalities in the brainstem are felt to be artifact on CT as this area appears normal on MRI. This is compatible  with beam hardening artifact on CT.  Ventricle size normal.  Cerebral volume normal.  Pituitary not enlarged. Craniocervical junction normal. Paranasal sinuses are clear. Mastoid sinuses are clear.  Negative for acute or chronic ischemia. Negative for demyelinating disease.  Hyperintense signal in the left internal carotid artery compatible with occlusion.  Negative for intracranial hemorrhage.  No mass or edema identified.  MRA HEAD FINDINGS  Decreased signal distal left vertebral artery which is likely due to atherosclerotic  disease and moderate stenosis. Distal right vertebral artery patent to the basilar. AICA patent bilaterally. Basilar widely patent. Superior cerebellar and posterior cerebral arteries patent bilaterally.  Right cavernous carotid shows mild atherosclerotic disease without significant stenosis. 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 Left anterior and middle cerebral arteries are patent and supplied via the anterior communicating artery and a small left posterior communicating artery which is patent. Right posterior communicating artery also patent  Negative for cerebral aneurysm.  IMPRESSION: Negative for acute or chronic ischemic change. CT finding in the brainstem is an artifact.  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   Electronically Signed   By: Franchot Gallo M.D.   On: 01/15/2015 12:42    Assessment/Plan:   LOS: 2 days  s/p Procedure(s): TRANSESOPHAGEAL ECHOCARDIOGRAM (TEE)  Patient has 90% right ICA stenosis by CT angiography and carotid duplex exam. 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 Discussed risks and benefits with him today and he would like to proceed I scheduled him for Wednesday morning. 4 transesophageal echocardiogram today. No cardiac history. Patient also states he is ready to completely quit smoking.   Tinnie Gens, MD 01/17/2015 9:19 AM

## 2015-01-18 ENCOUNTER — Ambulatory Visit (HOSPITAL_COMMUNITY): Payer: 59

## 2015-01-18 ENCOUNTER — Inpatient Hospital Stay (HOSPITAL_COMMUNITY): Payer: 59

## 2015-01-18 ENCOUNTER — Telehealth: Payer: Self-pay | Admitting: Cardiology

## 2015-01-18 ENCOUNTER — Encounter (HOSPITAL_COMMUNITY): Payer: Self-pay | Admitting: Cardiovascular Disease

## 2015-01-18 DIAGNOSIS — I82890 Acute embolism and thrombosis of other specified veins: Secondary | ICD-10-CM

## 2015-01-18 DIAGNOSIS — I82C19 Acute embolism and thrombosis of unspecified internal jugular vein: Secondary | ICD-10-CM

## 2015-01-18 DIAGNOSIS — I82403 Acute embolism and thrombosis of unspecified deep veins of lower extremity, bilateral: Secondary | ICD-10-CM

## 2015-01-18 LAB — HEPARIN LEVEL (UNFRACTIONATED)
HEPARIN UNFRACTIONATED: 0.19 [IU]/mL — AB (ref 0.30–0.70)
Heparin Unfractionated: 0.98 IU/mL — ABNORMAL HIGH (ref 0.30–0.70)
Heparin Unfractionated: 0.18 IU/mL — ABNORMAL LOW (ref 0.30–0.70)

## 2015-01-18 LAB — CBC
HCT: 42.6 % (ref 39.0–52.0)
HEMOGLOBIN: 14.3 g/dL (ref 13.0–17.0)
MCH: 31.4 pg (ref 26.0–34.0)
MCHC: 33.6 g/dL (ref 30.0–36.0)
MCV: 93.6 fL (ref 78.0–100.0)
Platelets: 242 10*3/uL (ref 150–400)
RBC: 4.55 MIL/uL (ref 4.22–5.81)
RDW: 12.9 % (ref 11.5–15.5)
WBC: 8.9 10*3/uL (ref 4.0–10.5)

## 2015-01-18 LAB — BASIC METABOLIC PANEL
Anion gap: 7 (ref 5–15)
BUN: 15 mg/dL (ref 6–20)
CHLORIDE: 103 mmol/L (ref 101–111)
CO2: 28 mmol/L (ref 22–32)
CREATININE: 1.15 mg/dL (ref 0.61–1.24)
Calcium: 9.8 mg/dL (ref 8.9–10.3)
GFR calc Af Amer: >60 mL/min (ref >60)
Glucose, Bld: 97 mg/dL (ref 65–99)
POTASSIUM: 4.7 mmol/L (ref 3.5–5.1)
SODIUM: 138 mmol/L (ref 135–145)

## 2015-01-18 LAB — ANCA TITERS
C-ANCA: <1:20 {titer}
P-ANCA: <1:20 {titer}

## 2015-01-18 LAB — HIV ANTIBODY (ROUTINE TESTING W REFLEX): HIV SCREEN 4TH GENERATION: NONREACTIVE

## 2015-01-18 LAB — PROTEIN S ACTIVITY: Protein S Activity: 49 % — ABNORMAL LOW (ref 60–145)

## 2015-01-18 LAB — PROTEIN C ACTIVITY: PROTEIN C ACTIVITY: 178 % — AB (ref 74–151)

## 2015-01-18 LAB — PROTEIN S, TOTAL: Protein S Ag, Total: 168 % — ABNORMAL HIGH (ref 58–150)

## 2015-01-18 LAB — BETA-2-GLYCOPROTEIN I ABS, IGG/M/A: BETA 2 GLYCO I IGG: UNDETERMINED GPI IgG units

## 2015-01-18 LAB — MRSA PCR SCREENING: MRSA BY PCR: NEGATIVE

## 2015-01-18 LAB — RHEUMATOID FACTOR: Rhuematoid fact SerPl-aCnc: <7 IU/mL (ref 0.0–13.9)

## 2015-01-18 LAB — PROTEIN C, TOTAL: PROTEIN C, TOTAL: 133 % (ref 70–140)

## 2015-01-18 LAB — LUPUS ANTICOAGULANT PANEL
DRVVT: 34.8 s (ref 0.0–55.1)
PTT LA: 41.2 s (ref 0.0–50.0)

## 2015-01-18 LAB — C3 COMPLEMENT: C3 COMPLEMENT: 137 mg/dL (ref 82–167)

## 2015-01-18 LAB — C4 COMPLEMENT: Complement C4, Body Fluid: 28 mg/dL (ref 14–44)

## 2015-01-18 MED ORDER — AMLODIPINE BESYLATE 5 MG PO TABS
5.0000 mg | ORAL_TABLET | Freq: Every day | ORAL | Status: DC
  Administered 2015-01-18 – 2015-01-20 (×2): 5 mg via ORAL
  Filled 2015-01-18 (×2): qty 1

## 2015-01-18 MED ORDER — WHITE PETROLATUM GEL
Status: AC
  Administered 2015-01-18: 10:00:00
  Filled 2015-01-18: qty 1

## 2015-01-18 MED ORDER — DEXTROSE 5 % IV SOLN
1.5000 g | INTRAVENOUS | Status: AC
  Administered 2015-01-19: 1.5 g via INTRAVENOUS
  Filled 2015-01-18: qty 1.5

## 2015-01-18 NOTE — Progress Notes (Signed)
  [facility] Progress Note  01/18/2015 9:38 AM  Patient scheduled for right carotid endarterectomy for symptomatic right carotid stenosis tomorrow with Dr. Kellie Simmering. The procedure, risks and benefits were discussed at length with the patient and all his questions were answered. The patient is very motivated to quit smoking. Hold heparin on call to OR tomorrow. NPO after midnight.    Virgina Jock, PA-C [facility] Office: [phone] Pager: [phone] 01/18/2015 9:38 AM

## 2015-01-18 NOTE — Care Management Note (Signed)
Case Management Note  Patient Details  Name: Martin Archer MRN: [id number] Date of Birth: 1961/05/28  Subjective/Objective:       Pt adm on 01/15/15 with numbness Lt arm and leg.  PTA, pt resides at home with spouse, and is independent.            Action/Plan: Will follow for dc needs as pt progresses.    Expected Discharge Date:                  Expected Discharge Plan:  [place]  In-House Referral:     Discharge planning Services  CM Consult  Post Acute Care Choice:    Choice offered to:     DME Arranged:    DME Agency:     HH Arranged:    [place] Agency:     Status of Service:  In process, will continue to follow  Medicare Important Message Given:    Date Medicare IM Given:    Medicare IM give by:    Date Additional Medicare IM Given:    Additional Medicare Important Message give by:     If discussed at [place] of Stay Meetings, dates discussed:    Additional Comments:  Ella Bodo, RN 01/18/2015, 3:25 PM

## 2015-01-18 NOTE — Progress Notes (Addendum)
ANTICOAGULATION CONSULT NOTE - FOLLOW UP  Pharmacy Consult:  Heparin Indication: DVT in the left jugular vein  Allergies  Allergen Reactions  . Adderall [Amphetamine-Dextroamphet Er]     diarrhea  . Prozac [Fluoxetine Hcl]     diarrhea  . Sertraline Hcl     REACTION: diarrhea    Patient Measurements: Height: 5\' 7"  (170.2 cm) Weight: 190 lb 7.6 oz (86.4 kg) IBW/kg (Calculated) : 66.1 Heparin Dosing Weight: 84 kg  Vital Signs: Temp: 98.3 F (36.8 C) (05/24 2101) Temp Source: Oral (05/24 2101) BP: 148/107 mmHg (05/24 2101) Pulse Rate: 87 (05/24 2101)  Labs:  Recent Labs  01/16/15 0341  01/17/15 0045  01/17/15 1928 01/18/15 0613 01/18/15 1505 01/18/15 2145  HGB 14.2  --  13.8  --   --  14.3  --   --   HCT 42.2  --  40.7  --   --  42.6  --   --   PLT 243  --  226  --   --  242  --   --   LABPROT  --   --   --   --  14.1  --   --   --   INR  --   --   --   --  1.07  --   --   --   HEPARINUNFRC  --   < > 0.30  < >  --  0.98* 0.18* 0.19*  CREATININE 1.14  --  1.11  --  1.09 1.15  --   --   < > = values in this interval not displayed.  Estimated Creatinine Clearance: 75.3 mL/min (by C-G formula based on Cr of 1.15).     Assessment: 66 YOM presented on 01/15/15 with numbness of the left arm and leg, and left-sided facial droop the night before.  Carotid ultrasound revealed occulusion of common carotid and internal carotid. CT angio also revealed a non-occlusive thrombus in the left external jugular vein extending into the left internal jugular vein along with probable non-occlusive thrombus in the right jugular vein.  Patient to continue on IV heparin.  Heparin level supratherapeutic, but no bleeding reported. CBC stable. Nurse verified level was not drawn from line where heparin is infusing.  F/u HL remains SUBtherapeutic at 0.19 on heparin 1200 units/hr. Nurse reports no issues with infusion or bleeding. Heparin to be turned off at 0900 tomorrow pending OR for right  carotid endarterectomy.  Goal of Therapy:  Heparin level 0.3-0.5 units/ml Monitor platelets by anticoagulation protocol: Yes   Plan:  Increase heparin to 1250 units/hr Daily HL / CBC F/U long-term AC, resume home meds as able, nutrition  Andrey Cota. Diona Foley, PharmD Clinical Pharmacist Pager [phone]  5/24/201611:14 PM   ADDN: F/u HL remains SUBtherapeutic at 0.2 on heparin 1250 units/hr. Will increase to 1300 units/hr and stop at 0900.  Andrey Cota. Diona Foley, PharmD Clinical Pharmacist Pager [phone]

## 2015-01-18 NOTE — Progress Notes (Signed)
*  PRELIMINARY RESULTS* Vascular Ultrasound Bilateral upper extremity and lower extremity venous duplex has been completed.  Preliminary findings: No DVT or superficial thrombosis noted in bilateral upper and lower extremities.    Landry Mellow, RDMS, RVT  01/18/2015, 2:19 PM

## 2015-01-18 NOTE — Progress Notes (Addendum)
ANTICOAGULATION CONSULT NOTE - FOLLOW UP  Pharmacy Consult:  Heparin Indication: DVT in the left jugular vein  Allergies  Allergen Reactions  . Adderall [Amphetamine-Dextroamphet Er]     diarrhea  . Prozac [Fluoxetine Hcl]     diarrhea  . Sertraline Hcl     REACTION: diarrhea    Patient Measurements: Height: 5\' 7"  (170.2 cm) Weight: 190 lb 7.6 oz (86.4 kg) IBW/kg (Calculated) : 66.1 Heparin Dosing Weight: 84 kg  Vital Signs: Temp: 98 F (36.7 C) (05/24 0552) Temp Source: Oral (05/24 0552) BP: 169/102 mmHg (05/24 0552) Pulse Rate: 78 (05/24 0552)  Labs:  Recent Labs  01/16/15 0341  01/17/15 0045 01/17/15 0533 01/17/15 1928 01/18/15 0613  HGB 14.2  --  13.8  --   --  14.3  HCT 42.2  --  40.7  --   --  42.6  PLT 243  --  226  --   --  242  LABPROT  --   --   --   --  14.1  --   INR  --   --   --   --  1.07  --   HEPARINUNFRC  --   < > 0.30 0.37  --  0.98*  CREATININE 1.14  --  1.11  --  1.09 1.15  < > = values in this interval not displayed.  Estimated Creatinine Clearance: 75.3 mL/min (by C-G formula based on Cr of 1.15).     Assessment: 29 YOM presented on 01/15/15 with numbness of the left arm and leg, and left-sided facial droop the night before.  Carotid ultrasound revealed occulusion of common carotid and internal carotid. CT angio also revealed a non-occlusive thrombus in the left external jugular vein extending into the left internal jugular vein along with probable non-occlusive thrombus in the right jugular vein.  Patient to continue on IV heparin.  Heparin level supratherapeutic, but no bleeding reported. CBC stable. Nurse verified level was not drawn from line where heparin is infusing.   Goal of Therapy:  Heparin level 0.3-0.5 units/ml Monitor platelets by anticoagulation protocol: Yes    Plan:  - Decrease heparin to 1100 units/hr -Repeat HL 6 hours after change - Daily HL / CBC - F/U long-term AC, resume home meds as able, nutrition  Levester Fresh, PharmD, BCPS Clinical Pharmacist Pager [phone] 5/24/20168:18 AM   Addendum: Heparin level is now below goal at 0.18 after rate decrease this morning. Increase heparin to 1200 units/hr and recheck another 6 hour heparin level. Heparin to be turned off at 0900 tomorrow pending OR for right carotid endarterectomy.  Nena Jordan, PharmD, BCPS 01/18/2015, 3:54 PM

## 2015-01-18 NOTE — Progress Notes (Signed)
STROKE TEAM PROGRESS NOTE   HISTORY Martin Archer is a 54 y.o. male with a past medical history significant for HTN, hyperlipidemia, s/p left CEA, OSA, gastric ulcer with hemorrhage and obstruction, diverticulosis of colon, transferred to [facility] for further evaluation of left-sided numbness and weakness. He initially presented to [facility] with complains of numbness-weakness of the left face-arm-leg last night and upon awakening today. Patient indicated that last night he noticed a numb sensation of his left face-arm-leg accompanied by weakness of arm and leg that lasted a couple of hours and subsided. (01/14/2015, time unclear). He said that his sister did notice some droopiness of the left face. Did not seek immediate medical attention, but became concerned this morning around 4 am when the symptoms came back again (LKW 01/15/2015 0400). Said that the weakness is gone but still having some numbness of the left arm and left foot. It is worth mentioning that he reports 5 or 6 similar episodes within the last couple of months. Complains of a mild HA and blurriness of vision, but denies vertigo, double vision, difficulty swallowing, slurred speech, imbalance, language impairment of confusion. CT brain was personally reviewed and showed no acute abnormality. He was not a tPA candidate as symptoms resolved.   SUBJECTIVE (INTERVAL HISTORY) No family at bedside. Patient up in chair at the bedside. On heparin drip. No recurrent stroke symptoms. Pan CT no cancer and TEE negative too. Waiting for CEA tomorrow.   OBJECTIVE Temp:  [98 F (36.7 C)-98.2 F (36.8 C)] 98.2 F (36.8 C) (05/24 1443) Pulse Rate:  [78-95] 87 (05/24 1443) Cardiac Rhythm:  [-] Normal sinus rhythm;Sinus bradycardia (05/24 0400) Resp:  [18] 18 (05/24 1443) BP: (130-169)/(97-118) 135/103 mmHg (05/24 1443) SpO2:  [96 %-98 %] 98 % (05/24 1443)  No results for input(s): GLUCAP in the last 168 hours.  Recent Labs Lab 01/15/15 0651  01/15/15 0657 01/16/15 0341 01/17/15 0045 01/17/15 1928 01/18/15 0613  NA 139 140 138 137 137 138  K 4.0 4.0 3.9 3.9 3.7 4.7  CL 103 106 101 99* 100* 103  CO2 25  --  28 29 27 28   GLUCOSE 127* 127* 94 105* 136* 97  BUN 27* 27* 16 16 15 15   CREATININE 1.17 1.20 1.14 1.11 1.09 1.15  CALCIUM 9.5  --  9.1 8.9 9.3 9.8    Recent Labs Lab 01/15/15 0651 01/16/15 0341  AST 30 26  ALT 25 21  ALKPHOS 90 74  BILITOT 0.5 0.8  PROT 7.0 6.2*  ALBUMIN 4.1 3.5    Recent Labs Lab 01/15/15 0651 01/15/15 0657 01/16/15 0341 01/17/15 0045 01/18/15 0613  WBC 9.2  --  10.3 9.5 8.9  NEUTROABS 6.1  --   --   --   --   HGB 13.7 14.3 14.2 13.8 14.3  HCT 40.8 42.0 42.2 40.7 42.6  MCV 94.2  --  92.7 92.7 93.6  PLT 255  --  243 226 242   No results for input(s): CKTOTAL, CKMB, CKMBINDEX, TROPONINI in the last 168 hours.  Recent Labs  01/17/15 1928  LABPROT 14.1  INR 1.07   No results for input(s): COLORURINE, LABSPEC, PHURINE, GLUCOSEU, HGBUR, BILIRUBINUR, KETONESUR, PROTEINUR, UROBILINOGEN, NITRITE, LEUKOCYTESUR in the last 72 hours.  Invalid input(s): APPERANCEUR     Component Value Date/Time   CHOL 266* 01/16/2015 0341   CHOL 286* 10/12/2013 0436   TRIG 407* 01/16/2015 0341   TRIG 572* 10/12/2013 0436   TRIG 284* 07/15/2006 1158   HDL 37*  01/16/2015 0341   HDL 35* 10/12/2013 0436   CHOLHDL 7.2 01/16/2015 0341   CHOLHDL 5.9 CALC 07/15/2006 1158   VLDL UNABLE TO CALCULATE IF TRIGLYCERIDE OVER 400 mg/dL 01/16/2015 0341   LDLCALC UNABLE TO CALCULATE IF TRIGLYCERIDE OVER 400 mg/dL 01/16/2015 0341   LDLCALC NOT CALC 10/12/2013 0436   Lab Results  Component Value Date   HGBA1C 5.6 01/16/2015      Component Value Date/Time   LABOPIA POSITIVE* 01/15/2015 0825   COCAINSCRNUR NONE DETECTED 01/15/2015 0825   LABBENZ NONE DETECTED 01/15/2015 0825   AMPHETMU NONE DETECTED 01/15/2015 0825   THCU POSITIVE* 01/15/2015 0825   LABBARB NONE DETECTED 01/15/2015 0825     Recent  Labs Lab 01/15/15 0651  ETH <5    Dg Chest 2 View my 01/15/2015    No acute cardiopulmonary process.  Possible 7 mm right lower lobe nodule. Recommend correlation with chest CT in the non acute setting.     Ct Head Wo Contrast 01/15/2015    1. Possible ischemic changes in the pons, midbrain, and medulla without associated hemorrhage.  2. Age advanced mild bilateral carotid siphon atherosclerosis.  3. MRI of the brain without contrast may be helpful in further evaluation to confirm or deny the above findings.     MRI / MRA Brain Wo Contrast 01/15/2015    Negative for acute or chronic ischemic change.  CT finding in the brainstem is an artifact.   Occluded left internal carotid artery of indeterminate age   Moderately severe stenosis distal left vertebral artery.     CT angiogram of the head and neck 01/16/2015 1. Occlusion of the left common carotid artery and left internal carotid artery from the arch through the skullbase. 2. 85% diameter stenosis proximal right internal carotid artery 3. Right vertebral artery widely patent. Mild to moderate stenosis distal left vertebral artery. 4. 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 This is nonocclusive. There is also probable nonocclusive thrombus in the right jugular vein.  Carotid duplex 01/15/2015 Right > 80% ICA stenosis at the level of the distal bulb into the proximal ICA.  Left - There appears to be an occlusion of the common carotid and Internal carotid artery.  The right vertebral artery is antegrade and the left carotid artery has a " bunny sign " consistent with a more proximal stenosis  2D echo - Procedure narrative: Transthoracic echocardiography. Imagequality was adequate. The study was technically difficult, as aresult of poor acoustic windows and poor sound wave transmission. - Left ventricle: The cavity size was normal. Systolic function wasnormal. The estimated ejection  fraction was in the range of 55%to 60%. Wall motion was normal; there were no regional wallmotion abnormalities. Doppler parameters are consistent withabnormal left ventricular relaxation (grade 1 diastolicdysfunction). Doppler parameters are consistent with highventricular filling pressure. Mild to moderate concentric leftventricular hypertrophy. - Aortic valve: Mildly to moderately calcified annulus. Mildlythickened, mildly calcified leaflets. - Aorta: MIld aortic root dilatation. Aortic root dimension: 41 mm(ED). - Mitral valve: Mildly calcified annulus. Mildly thickened leaflets. There was mild regurgitation. - Left atrium: The atrium was severely dilated. Volume/bsa, ES,(1-plane Simpson&'s, A2C): 41 ml/m^2.  UE and LE venous doppler 01/18/2015 1. Since No DVT or superficial thrombosis noted in bilateral upper and lower extremities.   TEE  01/17/2015  FINDINGS:  Normal LV function, mild to moderate LVH. No intracardiac mass/thrombus/vegetation. No right to left intracardiac shunt. Severe atherosclerosis of the aortic arch. A particularly large ulcerated  plaque is seen in the distal arch, across from the origin of the arch vessels. There is a large and highly mobile component, likely source of embolic events.  Hypercoagulable work up pending   Pan CT  01/17/2015 No CT correlate to the previously questioned right lower lobe pulmonary parenchymal nodule, which may have represented nipple shadow or something overlying the patient. No acute abnormality within the chest, abdomen, or pelvis.  PHYSICAL EXAM  Temp:  [98 F (36.7 C)-98.2 F (36.8 C)] 98.2 F (36.8 C) (05/24 1443) Pulse Rate:  [78-95] 87 (05/24 1443) Resp:  [18] 18 (05/24 1443) BP: (130-169)/(97-118) 135/103 mmHg (05/24 1443) SpO2:  [96 %-98 %] 98 % (05/24 1443)  General - Well nourished, well developed, in no apparent distress.  Ophthalmologic - Sharp disc margins OU.   Cardiovascular - Regular rate and  rhythm with no murmur.  Mental Status -  Level of arousal and orientation to time, place, and person were intact. Language including expression, naming, repetition, comprehension was assessed and found intact. Attention span and concentration were normal. Recent and remote memory were intact. Fund of Knowledge was assessed and was intact.  Cranial Nerves II - XII - II - Visual field intact OU. III, IV, VI - Extraocular movements intact. V - Facial sensation intact bilaterally. VII - Facial movement intact bilaterally. VIII - Hearing & vestibular intact bilaterally. X - Palate elevates symmetrically. XI - Chin turning & shoulder shrug intact bilaterally. XII - Tongue protrusion intact.  Motor Strength - The patient's strength was normal in all extremities and pronator drift was absent.  Bulk was normal and fasciculations were absent.   Motor Tone - Muscle tone was assessed at the neck and appendages and was normal.  Reflexes - The patient's reflexes were 1+ in all extremities and he had no pathological reflexes.  Sensory - Light touch, temperature/pinprick, vibration and proprioception, and Romberg testing were assessed and were symmetrical.    Coordination - The patient had normal movements in the hands and feet with no ataxia or dysmetria.  Tremor was absent.  Gait and Station - The patient's transfers, posture, gait, station, and turns were observed as normal.  ASSESSMENT/PLAN Martin Archer is a 54 y.o. male with history of hypertension, ongoing tobacco use, hyperlipidemia, status post left carotid endarterectomy, obstructive sleep apnea, gastric ulcer with hemorrhage and obstruction, and diverticulosis presenting with multiple transient episodes of left-sided numbness and weakness as well as a mild headache, facial droop, and blurred vision. He did not receive IV t-PA due to late presentation.  TIA:  Non-dominant - right hemisphere  Resultant  resolved  MRI  No  acute infarct  MRA  Left ICA occlusion  Carotid Doppler  Right >80% ICA stenosis  2-D echocardiogram no source of embolus  Venous Doppler no DVT in upper and lower extremities.  Pan CT negative for malignancy.  CTA showed left CCA occlusion, right ICA 85% stenosis, bilateral jugular vein thrombus  TEE - Severe atherosclerosis of the aortic arch. A particularly large ulcerated plaque is seen in the distal arch, across from the origin of the arch vessels. There is a large and highly mobile component, likely source of embolic events.  LDL - unable to calculate secondary to triglycerides greater than 400  HgbA1c 5.6  Hypercoagulable and autoimmune workup pending   Heparin for VTE prophylaxis Diet Heart Room service appropriate?: Yes; Fluid consistency:: Thin Diet NPO time specified  no antithrombotic prior to admission, now on heparin. Consider switch to  NOACs after right CEA.  Ongoing aggressive stroke risk factor management  Therapy recommendations: No follow-up PT  Disposition: Pending  Bilateral jugular vein thrombosis  Etiology not clear  venous doppler of LEs and UEs - negative for DVT  Hypercoagulable work up thus far negative, workup pending  pan CT negative for malignancy  On heparin drip, switch to NOACs after right CEA  Carotid occlusion/stenosis  Left CCA occlusion  S/p left ICA CEA but no occluded - likely due to emboli instead of plaque formation  Right ICA 85% stenosis  Vascular surgery consulted and R CEA tomorrow by Dr. Kellie Simmering  Hypertension  Home meds: Norvasc, losartan, and metoprolol  Blood pressure is running mildly high; however, patient needs elevated BP to maintain perfusion.  Antihypertensive medications on hold  BP goal 130-150 due to carotid occlusion/stenosis  Hyperlipidemia  Home meds:  No lipid lowering medications prior to admission  LDL unable to calculate, goal < 70  Zocor and fenofibrate added  Continue statin at  discharge  Tobacco abuse  Current smoker  Smoking cessation counseling provided  Nicotine patch provided  Pt is willing to quit  Other Stroke Risk Factors  UDS positive for opiates and THC. On Vicodin.  Obesity, Body mass index is 29.83 kg/(m^2).   Family hx stroke (father and uncle)  OSA - not on CPAP  Other Active Problems  Back pain - Vicodin  Hospital day # 3   Rosalin Hawking, MD PhD Stroke Neurology 01/18/2015 4:58 PM  To contact Stroke Continuity provider, please refer to [email or web address] After hours, contact General Neurology

## 2015-01-18 NOTE — Telephone Encounter (Signed)
Needs a TOC phone Call. Thanks

## 2015-01-18 NOTE — Progress Notes (Signed)
[facility] PROGRESS NOTE  Rosalie Akey Winward [id number] DOB: 05-30-1961 DOA: 01/15/2015 PCP: Unice Cobble, MD  Assessment/Plan: #1 probable TIA Clinical improvement. Symptoms resolved. CT head negative. MRI head no acute infarct. MRA head left ICA occlusion. Carotid Dopplers with greater than 80% right ICA stenosis. 2-D echo with normal EF with severely dilated left atrium. No source of emboli noted. CT angiogram showed left CCA occlusion, right ICA 85% stenosis, bilateral jugular venous thrombosis. LDL was unable to be Kiker secondary to triglycerides greater than 400. Hemoglobin A1c 5.6. Patient was not on any antithrombotic agent prior to admission. Patient currently on IV heparin for secondary stroke prophylaxis. TEE with normal LV function mild to moderate LVH, no intracardiac mass/thrombus/vegetation, no right-to-left intracardiac shunt. Severe atherosclerosis of the aortic arch. Particularly large ulcerated plaque seen in the distal arch across from the origin of the arch vessels. Large and highly mobile component likely source of embolic events. Lower extremity Dopplers and upper extremity Dopplers negative for DVT. Patient has been seen by vascular surgery and patient for right carotid endarterectomy on Wednesday 01/18/2015. Per neurology consider changing from IV heparin to NOAC after right carotid endarterectomy. Neurology following and appreciate input and recommendation.  #2 bilateral jugular vein thrombosis Unknown etiology. Lower extremity and upper extremity Dopplers have been ordered per neurology. Hypercoagulable panel pending. CT chest and abdomen and pelvis negative for acute abnormality. Patient has been started on IV heparin drip per neurology. TEE with normal LV function mild to moderate LVH, no intracardiac mass/thrombus/vegetation, no right-to-left intracardiac shunt. Severe atherosclerosis of the aortic arch. Particularly large ulcerated plaque seen in the distal  arch across from the origin of the arch vessels. Large and highly mobile component likely source of embolic events. Continue IV heparin. Per neurology recommendations consider switching to NOACs after right carotid endarterectomy..  #3 hyperlipidemia LDL unable to be calculated secondary to triglycerides greater than 400. Continue fenofibrate and statin. Follow.  #4 symptomatic right carotid stenosis/left carotid occlusion Patient with a left CCA occlusion. Patient with a right ICA 85% stenosis. Patient has been started on IV heparin. Patient has been seen in consultation by vascular surgery and patient scheduled for right carotid endarterectomy tomorrow Wednesday, 01/19/2015 per vascular surgery. Vascular surgery following and appreciate input and recommendations.  #5 hypertension Blood pressure elevated this morning. Will resume patient's home dose Norvasc at 5 mg daily. Goal blood pressure systolic [id number] to [id number] secondary to carotid occlusion/stenosis.  #6 tobacco abuse  tobacco cessation. Continue nicotine patch.  #7 chronic back pain Continue home pain regimen.  #8 prophylaxis On heparin for DVT prophylaxis.   Code Status: Full Family Communication: Updated patient. No family at bedside. Disposition Plan: Home when medically stable post carotid endarterectomy and after stroke workup is done.   Consultants:  Neurology: Dr. Aram Beecham 01/15/2015  Cardiology:  Vascular surgery: Dr. Scot Dock 01/16/2015  Procedures:  2-D echo 01/16/2015  CT head 01/15/2015  CT angiogram neck 01/16/2015  Carotid Dopplers 01/15/2015  MRI head MRA head 01/15/2015  TEE 01/17/15  CT chest abdomen and pelvis 01/17/2015  Bilateral upper extremity Dopplers 01/18/2015  Bilateral lower extremity Dopplers 01/18/2015  Antibiotics:  None  HPI/Subjective: Patient states numbness and weakness has improved. Patient eating his dinner.  Objective: Filed Vitals:   01/18/15 1443  BP: 135/103   Pulse: 87  Temp: 98.2 F (36.8 C)  Resp: 18    Intake/Output Summary (Last 24 hours) at 01/18/15 1703 Last data filed at 01/18/15 0843  Gross per 24 hour  Intake    360 ml  Output      0 ml  Net    360 ml   Filed Weights   01/16/15 1000 01/16/15 1044  Weight: 88.9 kg (195 lb 15.8 oz) 86.4 kg (190 lb 7.6 oz)    Exam:   General:  NAD  Cardiovascular: RRR  Respiratory: CTAB  Abdomen: Soft, nontender, nondistended, positive bowel sounds.  Musculoskeletal: No clubbing cyanosis or edema.  Data Reviewed: Basic Metabolic Panel:  Recent Labs Lab 01/15/15 0651 01/15/15 0657 01/16/15 0341 01/17/15 0045 01/17/15 1928 01/18/15 0613  NA 139 140 138 137 137 138  K 4.0 4.0 3.9 3.9 3.7 4.7  CL 103 106 101 99* 100* 103  CO2 25  --  28 29 27 28   GLUCOSE 127* 127* 94 105* 136* 97  BUN 27* 27* 16 16 15 15   CREATININE 1.17 1.20 1.14 1.11 1.09 1.15  CALCIUM 9.5  --  9.1 8.9 9.3 9.8   Liver Function Tests:  Recent Labs Lab 01/15/15 0651 01/16/15 0341  AST 30 26  ALT 25 21  ALKPHOS 90 74  BILITOT 0.5 0.8  PROT 7.0 6.2*  ALBUMIN 4.1 3.5   No results for input(s): LIPASE, AMYLASE in the last 168 hours. No results for input(s): AMMONIA in the last 168 hours. CBC:  Recent Labs Lab 01/15/15 0651 01/15/15 0657 01/16/15 0341 01/17/15 0045 01/18/15 0613  WBC 9.2  --  10.3 9.5 8.9  NEUTROABS 6.1  --   --   --   --   HGB 13.7 14.3 14.2 13.8 14.3  HCT 40.8 42.0 42.2 40.7 42.6  MCV 94.2  --  92.7 92.7 93.6  PLT 255  --  243 226 242   Cardiac Enzymes: No results for input(s): CKTOTAL, CKMB, CKMBINDEX, TROPONINI in the last 168 hours. BNP (last 3 results) No results for input(s): BNP in the last 8760 hours.  ProBNP (last 3 results) No results for input(s): PROBNP in the last 8760 hours.  CBG: No results for input(s): GLUCAP in the last 168 hours.  No results found for this or any previous visit (from the past 240 hour(s)).   Studies: Ct Chest W  Contrast  01/17/2015   CLINICAL DATA:  Right lower lobe pulmonary nodule. Long-term smoking history.  EXAM: CT CHEST, ABDOMEN, AND PELVIS WITH CONTRAST  TECHNIQUE: Multidetector CT imaging of the chest, abdomen and pelvis was performed following the standard protocol during bolus administration of intravenous contrast.  CONTRAST:  159mL OMNIPAQUE IOHEXOL 300 MG/ML  SOLN  COMPARISON:  02/10/2010 CT abdomen/pelvis, chest radiograph 01/15/2015, neck CTA 01/16/2015  FINDINGS: CT CHEST FINDINGS  Mediastinum/Nodes: Left common carotid arterial occlusion reidentified. Heart size is normal. No pericardial effusion. Mild atheromatous aortic and coronary arterial calcification. No lymphadenopathy.  Lungs/Pleura: No pulmonary parenchymal nodule, mass, or consolidation is identified. No specific poorly to the previously questioned finding, possibly previously representing nipple shadow. No pleural effusion.  Upper abdomen: Please see dedicated report below.  Musculoskeletal: No acute osseous abnormality.  CT ABDOMEN AND PELVIS FINDINGS  Lower chest:  Please see dedicated report above.  Hepatobiliary: Liver and gallbladder appear normal.  Pancreas: Normal  Spleen: Normal  Adrenals/Urinary Tract: Adrenal glands are unremarkable. Left renal cortical scarring noted. Right kidney is unremarkable. No perinephric fluid or hydroureteronephrosis. No radiopaque renal, ureteral, or bladder calculus.  Stomach/Bowel: Normal-appearing appendix. No bowel wall thickening or focal segmental dilatation is identified. Colonic diverticuli noted without evidence for diverticulitis.  Vascular/Lymphatic: Moderate atheromatous aortic calcification  without aneurysm. No lymphadenopathy.  Other: No free air or fluid.  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 1.6 cm anterolisthesis L5 on S1. Remote appearing sclerotic L5 compression deformity posteriorly approximately 30 percent vertebral body height. No acute osseous  abnormality.  IMPRESSION: No CT correlate to the previously questioned right lower lobe pulmonary parenchymal nodule, which may have represented nipple shadow or something overlying the patient.  No acute abnormality within the chest, abdomen, or pelvis.   Electronically Signed   By: Conchita Paris M.D.   On: 01/17/2015 12:55   Ct Abdomen Pelvis W Contrast  01/17/2015   CLINICAL DATA:  Right lower lobe pulmonary nodule. Long-term smoking history.  EXAM: CT CHEST, ABDOMEN, AND PELVIS WITH CONTRAST  TECHNIQUE: Multidetector CT imaging of the chest, abdomen and pelvis was performed following the standard protocol during bolus administration of intravenous contrast.  CONTRAST:  155mL OMNIPAQUE IOHEXOL 300 MG/ML  SOLN  COMPARISON:  02/10/2010 CT abdomen/pelvis, chest radiograph 01/15/2015, neck CTA 01/16/2015  FINDINGS: CT CHEST FINDINGS  Mediastinum/Nodes: Left common carotid arterial occlusion reidentified. Heart size is normal. No pericardial effusion. Mild atheromatous aortic and coronary arterial calcification. No lymphadenopathy.  Lungs/Pleura: No pulmonary parenchymal nodule, mass, or consolidation is identified. No specific poorly to the previously questioned finding, possibly previously representing nipple shadow. No pleural effusion.  Upper abdomen: Please see dedicated report below.  Musculoskeletal: No acute osseous abnormality.  CT ABDOMEN AND PELVIS FINDINGS  Lower chest:  Please see dedicated report above.  Hepatobiliary: Liver and gallbladder appear normal.  Pancreas: Normal  Spleen: Normal  Adrenals/Urinary Tract: Adrenal glands are unremarkable. Left renal cortical scarring noted. Right kidney is unremarkable. No perinephric fluid or hydroureteronephrosis. No radiopaque renal, ureteral, or bladder calculus.  Stomach/Bowel: Normal-appearing appendix. No bowel wall thickening or focal segmental dilatation is identified. Colonic diverticuli noted without evidence for diverticulitis.   Vascular/Lymphatic: Moderate atheromatous aortic calcification without aneurysm. No lymphadenopathy.  Other: No free air or fluid.  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 1.6 cm anterolisthesis L5 on S1. Remote appearing sclerotic L5 compression deformity posteriorly approximately 30 percent vertebral body height. No acute osseous abnormality.  IMPRESSION: No CT correlate to the previously questioned right lower lobe pulmonary parenchymal nodule, which may have represented nipple shadow or something overlying the patient.  No acute abnormality within the chest, abdomen, or pelvis.   Electronically Signed   By: Conchita Paris M.D.   On: 01/17/2015 12:55    Scheduled Meds: . amLODipine  5 mg Oral Daily  . [START ON 01/19/2015] cefUROXime (ZINACEF)  IV  1.5 g Intravenous On Call to OR  . fenofibrate  160 mg Oral Daily  . nicotine  14 mg Transdermal Daily  . pantoprazole  20 mg Oral Daily  . simvastatin  20 mg Oral q1800   Continuous Infusions: . heparin 1,200 Units/hr (01/18/15 1605)    Principal Problem:   TIA (transient ischemic attack) Active Problems:   Mixed hyperlipidemia   Essential hypertension   Sleep apnea   Numbness and tingling of left arm and leg   CVA (cerebral infarction)   Carotid stenosis   Thrombus: BILATERAL EXTERNAL JUGULAR VEINS per CT angio head and neck 01/16/15   DVT (deep venous thrombosis)   Stroke   Aortic arch atherosclerosis    Time spent: 68 minutes    Gordie Crumby,Jashua M.D. [facility] Pager [phone]. If 7PM-7AM, please contact night-coverage at [email or web address], password [facility] 01/18/2015, 5:03 PM  LOS: 3 days

## 2015-01-19 ENCOUNTER — Encounter (HOSPITAL_COMMUNITY)
Admission: EM | Disposition: A | Discharge status: Home / Self Care | Payer: Self-pay | Source: Home / Self Care | Attending: Internal Medicine

## 2015-01-19 ENCOUNTER — Inpatient Hospital Stay (HOSPITAL_COMMUNITY): Payer: 59 | Admitting: Anesthesiology

## 2015-01-19 DIAGNOSIS — I720 Aneurysm of carotid artery: Secondary | ICD-10-CM | POA: Diagnosis present

## 2015-01-19 HISTORY — PX: ENDARTERECTOMY: SHX5162

## 2015-01-19 LAB — ANTI-DNA ANTIBODY, DOUBLE-STRANDED: ds DNA Ab: <1 IU/mL

## 2015-01-19 LAB — FACTOR 5 LEIDEN

## 2015-01-19 LAB — BASIC METABOLIC PANEL
ANION GAP: 9 (ref 5–15)
BUN: 15 mg/dL (ref 6–20)
CHLORIDE: 104 mmol/L (ref 101–111)
CO2: 25 mmol/L (ref 22–32)
CREATININE: 1.11 mg/dL (ref 0.61–1.24)
Calcium: 9.2 mg/dL (ref 8.9–10.3)
GFR calc non Af Amer: >60 mL/min (ref >60)
GLUCOSE: 91 mg/dL (ref 65–99)
Potassium: 4.4 mmol/L (ref 3.5–5.1)
SODIUM: 138 mmol/L (ref 135–145)

## 2015-01-19 LAB — CBC
HCT: 40.1 % (ref 39.0–52.0)
HEMATOCRIT: 36.6 % — AB (ref 39.0–52.0)
HEMOGLOBIN: 12.3 g/dL — AB (ref 13.0–17.0)
Hemoglobin: 13.7 g/dL (ref 13.0–17.0)
MCH: 31.8 pg (ref 26.0–34.0)
MCH: 31.3 pg (ref 26.0–34.0)
MCHC: 34.2 g/dL (ref 30.0–36.0)
MCHC: 33.6 g/dL (ref 30.0–36.0)
MCV: 93 fL (ref 78.0–100.0)
MCV: 93.1 fL (ref 78.0–100.0)
Platelets: 233 10*3/uL (ref 150–400)
Platelets: 208 10*3/uL (ref 150–400)
RBC: 4.31 MIL/uL (ref 4.22–5.81)
RBC: 3.93 MIL/uL — ABNORMAL LOW (ref 4.22–5.81)
RDW: 12.7 % (ref 11.5–15.5)
RDW: 12.7 % (ref 11.5–15.5)
WBC: 8.7 10*3/uL (ref 4.0–10.5)
WBC: 12.7 10*3/uL — AB (ref 4.0–10.5)

## 2015-01-19 LAB — CREATININE, SERUM
CREATININE: 1.06 mg/dL (ref 0.61–1.24)
GFR calc Af Amer: >60 mL/min (ref >60)
GFR calc non Af Amer: >60 mL/min (ref >60)

## 2015-01-19 LAB — CARDIOLIPIN ANTIBODIES, IGG, IGM, IGA
Anticardiolipin IgA: <9 APL U/mL (ref 0–11)
Anticardiolipin IgG: <9 GPL U/mL (ref 0–14)
Anticardiolipin IgM: <9 MPL U/mL (ref 0–12)

## 2015-01-19 LAB — SJOGRENS SYNDROME-A EXTRACTABLE NUCLEAR ANTIBODY: SSA (Ro) (ENA) Antibody, IgG: <1

## 2015-01-19 LAB — HEPARIN LEVEL (UNFRACTIONATED): HEPARIN UNFRACTIONATED: 0.2 [IU]/mL — AB (ref 0.30–0.70)

## 2015-01-19 LAB — SJOGRENS SYNDROME-B EXTRACTABLE NUCLEAR ANTIBODY: SSB (LA) (ENA) ANTIBODY, IGG: NEGATIVE

## 2015-01-19 SURGERY — ENDARTERECTOMY, CAROTID
Anesthesia: General | Laterality: Right

## 2015-01-19 MED ORDER — OXYCODONE HCL 5 MG/5ML PO SOLN: 5.0000 mg | Freq: Once | ORAL | Status: DC | PRN

## 2015-01-19 MED ORDER — MORPHINE SULFATE 2 MG/ML IJ SOLN
2.0000 mg | INTRAMUSCULAR | Status: DC | PRN
  Administered 2015-01-20: 2 mg via INTRAVENOUS
  Filled 2015-01-19 (×2): qty 1

## 2015-01-19 MED ORDER — DOCUSATE SODIUM 100 MG PO CAPS
100.0000 mg | ORAL_CAPSULE | Freq: Every day | ORAL | Status: DC
  Administered 2015-01-20: 100 mg via ORAL
  Filled 2015-01-19: qty 1

## 2015-01-19 MED ORDER — METOPROLOL TARTRATE 1 MG/ML IV SOLN: 2.0000 mg | INTRAVENOUS | Status: DC | PRN

## 2015-01-19 MED ORDER — FENTANYL CITRATE (PF) 250 MCG/5ML IJ SOLN
INTRAMUSCULAR | Status: DC | PRN
  Administered 2015-01-19: 100 ug via INTRAVENOUS
  Administered 2015-01-19: 150 ug via INTRAVENOUS

## 2015-01-19 MED ORDER — 0.9 % SODIUM CHLORIDE (POUR BTL) OPTIME
TOPICAL | Status: DC | PRN
  Administered 2015-01-19 (×2): 1000 mL

## 2015-01-19 MED ORDER — HEPARIN SODIUM (PORCINE) 1000 UNIT/ML IJ SOLN
INTRAMUSCULAR | Status: DC | PRN
  Administered 2015-01-19: 6000 [IU] via INTRAVENOUS

## 2015-01-19 MED ORDER — ASPIRIN EC 325 MG PO TBEC
325.0000 mg | DELAYED_RELEASE_TABLET | Freq: Every day | ORAL | Status: DC
  Administered 2015-01-19 – 2015-01-20 (×2): 325 mg via ORAL
  Filled 2015-01-19 (×2): qty 1

## 2015-01-19 MED ORDER — ONDANSETRON HCL 4 MG/2ML IJ SOLN: 4.0000 mg | Freq: Four times a day (QID) | INTRAMUSCULAR | Status: DC | PRN

## 2015-01-19 MED ORDER — MAGNESIUM SULFATE 2 GM/50ML IV SOLN: 2.0000 g | Freq: Every day | INTRAVENOUS | Status: DC | PRN

## 2015-01-19 MED ORDER — DEXTROSE 5 % IV SOLN
1.5000 g | Freq: Two times a day (BID) | INTRAVENOUS | Status: AC
  Administered 2015-01-19 – 2015-01-20 (×2): 1.5 g via INTRAVENOUS
  Filled 2015-01-19 (×2): qty 1.5

## 2015-01-19 MED ORDER — FENTANYL CITRATE (PF) 100 MCG/2ML IJ SOLN
INTRAMUSCULAR | Status: AC
  Administered 2015-01-19: 50 ug via INTRAVENOUS
  Filled 2015-01-19: qty 2

## 2015-01-19 MED ORDER — ENOXAPARIN SODIUM 30 MG/0.3ML ~~LOC~~ SOLN
30.0000 mg | SUBCUTANEOUS | Status: DC
  Administered 2015-01-20: 30 mg via SUBCUTANEOUS
  Filled 2015-01-19 (×2): qty 0.3

## 2015-01-19 MED ORDER — ALUM & MAG HYDROXIDE-SIMETH 200-200-20 MG/5ML PO SUSP: 15.0000 mL | ORAL | Status: DC | PRN

## 2015-01-19 MED ORDER — FENTANYL CITRATE (PF) 100 MCG/2ML IJ SOLN
INTRAMUSCULAR | Status: AC
  Filled 2015-01-19: qty 2

## 2015-01-19 MED ORDER — ONDANSETRON HCL 4 MG/2ML IJ SOLN
4.0000 mg | Freq: Four times a day (QID) | INTRAMUSCULAR | Status: DC | PRN
  Administered 2015-01-20: 4 mg via INTRAVENOUS
  Filled 2015-01-19: qty 2

## 2015-01-19 MED ORDER — HYDRALAZINE HCL 20 MG/ML IJ SOLN: 5.0000 mg | INTRAMUSCULAR | Status: DC | PRN

## 2015-01-19 MED ORDER — ACETAMINOPHEN 325 MG PO TABS
325.0000 mg | ORAL_TABLET | ORAL | Status: DC | PRN
Start: 2015-01-19 — End: 2015-01-20

## 2015-01-19 MED ORDER — ONDANSETRON HCL 4 MG/2ML IJ SOLN
INTRAMUSCULAR | Status: DC | PRN
  Administered 2015-01-19: 4 mg via INTRAVENOUS

## 2015-01-19 MED ORDER — MIDAZOLAM HCL 2 MG/2ML IJ SOLN
INTRAMUSCULAR | Status: AC
  Filled 2015-01-19: qty 2

## 2015-01-19 MED ORDER — SODIUM CHLORIDE 0.9 % IR SOLN
Status: DC | PRN
  Administered 2015-01-19: 12:00:00

## 2015-01-19 MED ORDER — SODIUM CHLORIDE 0.9 % IV SOLN
INTRAVENOUS | Status: DC
  Administered 2015-01-19: 16:00:00 via INTRAVENOUS

## 2015-01-19 MED ORDER — PROPOFOL 10 MG/ML IV BOLUS
INTRAVENOUS | Status: DC | PRN
  Administered 2015-01-19: 200 mg via INTRAVENOUS

## 2015-01-19 MED ORDER — LACTATED RINGERS IV SOLN
INTRAVENOUS | Status: DC
  Administered 2015-01-19 (×2): via INTRAVENOUS

## 2015-01-19 MED ORDER — GLYCOPYRROLATE 0.2 MG/ML IJ SOLN
INTRAMUSCULAR | Status: DC | PRN
  Administered 2015-01-19: 0.2 mg via INTRAVENOUS
  Administered 2015-01-19: .2 mg via INTRAVENOUS
  Administered 2015-01-19: .4 mg via INTRAVENOUS

## 2015-01-19 MED ORDER — LIDOCAINE HCL 4 % MT SOLN
OROMUCOSAL | Status: DC | PRN
  Administered 2015-01-19: 4 mL via TOPICAL

## 2015-01-19 MED ORDER — ARTIFICIAL TEARS OP OINT
TOPICAL_OINTMENT | OPHTHALMIC | Status: DC | PRN
  Administered 2015-01-19: 1 via OPHTHALMIC

## 2015-01-19 MED ORDER — ACETAMINOPHEN 650 MG RE SUPP: 325.0000 mg | RECTAL | Status: DC | PRN

## 2015-01-19 MED ORDER — THROMBIN 20000 UNITS EX SOLR
CUTANEOUS | Status: AC
  Filled 2015-01-19: qty 20000

## 2015-01-19 MED ORDER — LIDOCAINE HCL (CARDIAC) 20 MG/ML IV SOLN
INTRAVENOUS | Status: DC | PRN
  Administered 2015-01-19: 100 mg via INTRAVENOUS

## 2015-01-19 MED ORDER — PROTAMINE SULFATE 10 MG/ML IV SOLN
INTRAVENOUS | Status: DC | PRN
  Administered 2015-01-19 (×5): 10 mg via INTRAVENOUS

## 2015-01-19 MED ORDER — BISACODYL 10 MG RE SUPP: 10.0000 mg | Freq: Every day | RECTAL | Status: DC | PRN

## 2015-01-19 MED ORDER — MIDAZOLAM HCL 2 MG/2ML IJ SOLN
INTRAMUSCULAR | Status: AC
  Administered 2015-01-19: 0.5 mg via INTRAVENOUS
  Filled 2015-01-19: qty 2

## 2015-01-19 MED ORDER — MIDAZOLAM HCL 5 MG/5ML IJ SOLN
INTRAMUSCULAR | Status: DC | PRN
  Administered 2015-01-19: 2 mg via INTRAVENOUS

## 2015-01-19 MED ORDER — PROPOFOL 10 MG/ML IV BOLUS
INTRAVENOUS | Status: AC
  Filled 2015-01-19: qty 20

## 2015-01-19 MED ORDER — OXYCODONE HCL 5 MG PO TABS: 5.0000 mg | ORAL_TABLET | Freq: Once | ORAL | Status: DC | PRN

## 2015-01-19 MED ORDER — OXYCODONE HCL 5 MG PO TABS: 5.0000 mg | ORAL_TABLET | ORAL | Status: DC | PRN

## 2015-01-19 MED ORDER — PHENYLEPHRINE HCL 10 MG/ML IJ SOLN
10.0000 mg | INTRAVENOUS | Status: DC | PRN
  Administered 2015-01-19: 20 ug/min via INTRAVENOUS

## 2015-01-19 MED ORDER — LABETALOL HCL 5 MG/ML IV SOLN
10.0000 mg | INTRAVENOUS | Status: DC | PRN
  Administered 2015-01-20 (×3): 10 mg via INTRAVENOUS
  Filled 2015-01-19 (×3): qty 4

## 2015-01-19 MED ORDER — FENTANYL CITRATE (PF) 250 MCG/5ML IJ SOLN
INTRAMUSCULAR | Status: AC
  Filled 2015-01-19: qty 5

## 2015-01-19 MED ORDER — POTASSIUM CHLORIDE CRYS ER 20 MEQ PO TBCR: 20.0000 meq | EXTENDED_RELEASE_TABLET | Freq: Every day | ORAL | Status: DC | PRN

## 2015-01-19 MED ORDER — GUAIFENESIN-DM 100-10 MG/5ML PO SYRP: 15.0000 mL | ORAL_SOLUTION | ORAL | Status: DC | PRN

## 2015-01-19 MED ORDER — SODIUM CHLORIDE 0.9 % IV SOLN: 500.0000 mL | Freq: Once | INTRAVENOUS | Status: AC | PRN

## 2015-01-19 MED ORDER — NEOSTIGMINE METHYLSULFATE 10 MG/10ML IV SOLN
INTRAVENOUS | Status: DC | PRN
  Administered 2015-01-19: 3 mg via INTRAVENOUS

## 2015-01-19 MED ORDER — FENTANYL CITRATE (PF) 100 MCG/2ML IJ SOLN
25.0000 ug | INTRAMUSCULAR | Status: DC | PRN
  Administered 2015-01-19 (×2): 25 ug via INTRAVENOUS

## 2015-01-19 MED ORDER — PHENOL 1.4 % MT LIQD: 1.0000 | OROMUCOSAL | Status: DC | PRN

## 2015-01-19 MED ORDER — ESMOLOL HCL 10 MG/ML IV SOLN
INTRAVENOUS | Status: DC | PRN
  Administered 2015-01-19 (×2): 20 mg via INTRAVENOUS

## 2015-01-19 MED ORDER — ROCURONIUM BROMIDE 100 MG/10ML IV SOLN
INTRAVENOUS | Status: DC | PRN
  Administered 2015-01-19: 50 mg via INTRAVENOUS

## 2015-01-19 MED ORDER — SENNOSIDES-DOCUSATE SODIUM 8.6-50 MG PO TABS
1.0000 | ORAL_TABLET | Freq: Every evening | ORAL | Status: DC | PRN
  Filled 2015-01-19: qty 1

## 2015-01-19 SURGICAL SUPPLY — 47 items
CANISTER SUCTION 2500CC (MISCELLANEOUS) ×3 IMPLANT
CATH ROBINSON RED A/P 18FR (CATHETERS) ×3 IMPLANT
CATH SUCT 10FR WHISTLE TIP (CATHETERS) ×3 IMPLANT
CLIP TI MEDIUM 24 (CLIP) ×3 IMPLANT
CLIP TI WIDE RED SMALL 24 (CLIP) ×3 IMPLANT
CRADLE DONUT ADULT HEAD (MISCELLANEOUS) ×3 IMPLANT
DECANTER SPIKE VIAL GLASS SM (MISCELLANEOUS) IMPLANT
DRAIN HEMOVAC 1/8 X 5 (WOUND CARE) IMPLANT
DRSG COVADERM 4X8 (GAUZE/BANDAGES/DRESSINGS) ×2 IMPLANT
ELECT REM PT RETURN 9FT ADLT (ELECTROSURGICAL) ×3
ELECTRODE REM PT RTRN 9FT ADLT (ELECTROSURGICAL) ×1 IMPLANT
EVACUATOR SILICONE 100CC (DRAIN) IMPLANT
GAUZE SPONGE 4X4 12PLY STRL (GAUZE/BANDAGES/DRESSINGS) ×1 IMPLANT
GLOVE BIO SURGEON STRL SZ7.5 (GLOVE) ×2 IMPLANT
GLOVE BIOGEL PI IND STRL 6.5 (GLOVE) IMPLANT
GLOVE BIOGEL PI IND STRL 7.5 (GLOVE) IMPLANT
GLOVE BIOGEL PI IND STRL 8 (GLOVE) IMPLANT
GLOVE BIOGEL PI INDICATOR 6.5 (GLOVE) ×2
GLOVE BIOGEL PI INDICATOR 7.5 (GLOVE) ×2
GLOVE BIOGEL PI INDICATOR 8 (GLOVE) ×2
GLOVE ECLIPSE 7.0 STRL STRAW (GLOVE) ×4 IMPLANT
GLOVE SS BIOGEL STRL SZ 7 (GLOVE) ×1 IMPLANT
GLOVE SUPERSENSE BIOGEL SZ 7 (GLOVE) ×2
GOWN STRL REUS W/ TWL LRG LVL3 (GOWN DISPOSABLE) ×3 IMPLANT
GOWN STRL REUS W/ TWL XL LVL3 (GOWN DISPOSABLE) IMPLANT
GOWN STRL REUS W/TWL LRG LVL3 (GOWN DISPOSABLE) ×9
GOWN STRL REUS W/TWL XL LVL3 (GOWN DISPOSABLE) ×3
INSERT FOGARTY SM (MISCELLANEOUS) ×3 IMPLANT
KIT BASIN OR (CUSTOM PROCEDURE TRAY) ×3 IMPLANT
KIT ROOM TURNOVER OR (KITS) ×3 IMPLANT
NEEDLE 22X1 1/2 (OR ONLY) (NEEDLE) IMPLANT
NS IRRIG 1000ML POUR BTL (IV SOLUTION) ×6 IMPLANT
PACK CAROTID (CUSTOM PROCEDURE TRAY) ×3 IMPLANT
PAD ARMBOARD 7.5X6 YLW CONV (MISCELLANEOUS) ×6 IMPLANT
PATCH HEMASHIELD 8X75 (Vascular Products) ×2 IMPLANT
SHUNT CAROTID BYPASS 12FRX15.5 (VASCULAR PRODUCTS) IMPLANT
SPONGE INTESTINAL PEANUT (DISPOSABLE) ×1 IMPLANT
SUT PROLENE 6 0 C 1 24 (SUTURE) ×2 IMPLANT
SUT PROLENE 6 0 CC (SUTURE) ×9 IMPLANT
SUT SILK 2 0 FS (SUTURE) ×3 IMPLANT
SUT SILK 3 0 TIES 17X18 (SUTURE)
SUT SILK 3-0 18XBRD TIE BLK (SUTURE) IMPLANT
SUT VIC AB 2-0 CT1 27 (SUTURE) ×3
SUT VIC AB 2-0 CT1 TAPERPNT 27 (SUTURE) ×1 IMPLANT
SUT VIC AB 3-0 X1 27 (SUTURE) ×3 IMPLANT
SYR CONTROL 10ML LL (SYRINGE) IMPLANT
WATER STERILE IRR 1000ML POUR (IV SOLUTION) ×3 IMPLANT

## 2015-01-19 NOTE — Transfer of Care (Signed)
Immediate Anesthesia Transfer of Care Note  Patient: Martin Archer  Procedure(s) Performed: Procedure(s): RIGHT CAROTID ENDARTERECTOMY WITH PATCH ANGIOPLASTY (Right)  Patient Location: PACU  Anesthesia Type:General  Level of Consciousness: awake, alert  and oriented  Airway & Oxygen Therapy: Patient Spontanous Breathing and Patient connected to nasal cannula oxygen  Post-op Assessment: Report given to RN, Post -op Vital signs reviewed and stable and Patient moving all extremities X 4  Post vital signs: Reviewed and stable  Last Vitals:  Filed Vitals:   01/19/15 0546  BP: 140/94  Pulse: 73  Temp: 36.6 C  Resp: 18    Complications: No apparent anesthesia complications

## 2015-01-19 NOTE — Progress Notes (Signed)
       Patient alert and oriented times 3 Slight asymmetric smile , no tongue deviation Incisional dressing dry Grip 5/5 equal bil.  S/P right CEA Stable Marlaysia Lenig MAUREEN PA-C

## 2015-01-19 NOTE — Anesthesia Postprocedure Evaluation (Signed)
Anesthesia Post Note  Patient: Martin Archer  Procedure(s) Performed: Procedure(s) (LRB): RIGHT CAROTID ENDARTERECTOMY WITH PATCH ANGIOPLASTY (Right)  Anesthesia type: General  Patient location: PACU  Post pain: Pain level controlled and Adequate analgesia  Post assessment: Post-op Vital signs reviewed, Patient's Cardiovascular Status Stable, Respiratory Function Stable, Patent Airway and Pain level controlled  Last Vitals:  Filed Vitals:   01/19/15 1430  BP: 120/93  Pulse: 67  Temp:   Resp: 8    Post vital signs: Reviewed and stable  Level of consciousness: awake, alert  and oriented  Complications: No apparent anesthesia complications

## 2015-01-19 NOTE — H&P (View-Only) (Signed)
Patient ID: Martin Archer, male   DOB: 06/29/1961, 54 y.o.   MRN: [id number] Vascular Surgery Progress Note  Subjective: Patient with multiple TIAs over the last few months. Sometimes he describes transient weakness on the right side and sometimes he describes transient weakness on the left side. Also has described some left visual changes at time. Symptoms always been completely reversed. Patient had left carotid endarterectomy by me in 2008. He returned in 2009 for follow-up with widely patent left carotid system and has never returned sense. Has heavy tobacco abuse history pack a day for 40+ years. No coronary artery disease by history and no cardiac symptoms.  Objective:  Filed Vitals:   01/17/15 0525  BP: 132/101  Pulse: 81  Temp: 98 F (36.7 C)  Resp: 17    General alert and oriented 3 Neurologic exam today is normal 3+ carotid pulse palpable absent left carotid pulse.   Labs:  Recent Labs Lab 01/15/15 0657 01/16/15 0341 01/17/15 0045  CREATININE 1.20 1.14 1.11    Recent Labs Lab 01/15/15 0651 01/15/15 0657 01/16/15 0341 01/17/15 0045  NA 139 140 138 137  K 4.0 4.0 3.9 3.9  CL 103 106 101 99*  CO2 25  --  28 29  BUN 27* 27* 16 16  CREATININE 1.17 1.20 1.14 1.11  GLUCOSE 127* 127* 94 105*  CALCIUM 9.5  --  9.1 8.9    Recent Labs Lab 01/15/15 0651 01/15/15 0657 01/16/15 0341 01/17/15 0045  WBC 9.2  --  10.3 9.5  HGB 13.7 14.3 14.2 13.8  HCT 40.8 42.0 42.2 40.7  PLT 255  --  243 226    Recent Labs Lab 01/15/15 0651  INR 0.93       Imaging: Dg Chest 2 View  01/15/2015   CLINICAL DATA:  Patient with left body numbness and dizziness.  EXAM: CHEST  2 VIEW  COMPARISON:  Chest radiograph 12/04/2006  FINDINGS: Monitoring leads overlie the patient. Cardiac contours upper limits of normal. No consolidative pulmonary opacities. Possible 7 mm right lower lobe nodule. No pleural effusion or pneumothorax. Regional skeleton is unremarkable.  IMPRESSION:  No acute cardiopulmonary process.  Possible 7 mm right lower lobe nodule. Recommend correlation with chest CT in the non acute setting.   Electronically Signed   By: Lovey Newcomer M.D.   On: 01/15/2015 16:02   Ct Angio Neck W/cm &/or Wo/cm  01/16/2015   CLINICAL DATA:  Right brain TIA  EXAM: CT ANGIOGRAPHY NECK  TECHNIQUE: Multidetector CT imaging of the neck was performed using the standard protocol during bolus administration of intravenous contrast. Multiplanar CT image reconstructions and MIPs were obtained to evaluate the vascular anatomy. Carotid stenosis measurements (when applicable) are obtained utilizing NASCET criteria, using the distal internal carotid diameter as the denominator.  CONTRAST:  79mL OMNIPAQUE IOHEXOL 350 MG/ML SOLN  COMPARISON:  MRI/MRA head 01/15/2015  FINDINGS: Aortic arch: Mild atherosclerotic aortic arch. Lung apices are clear.  Right carotid system: Right common carotid artery is patent with mild atherosclerotic disease. There is calcified and noncalcified plaque in the carotid bulb. Critical stenosis measuring 85% diameter stenosis of the proximal right internal carotid artery . External carotid artery widely patent. Right internal carotid artery patent to the skullbase.  Left carotid system: Left common carotid artery is occluded at the aortic arch. Left internal carotid artery is occluded through the skullbase with intracranial reconstitution. See MRA report from yesterday.  Vertebral arteries:Right vertebral artery widely patent to the basilar without stenosis.  Left vertebral artery demonstrates a mild to moderate stenosis at the level the foramen magnum, less severe than that suggested on MRA. There is also mild stenosis of the distal left vertebral artery before the basilar.  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 This is nonocclusive. There is also filling defect in the right internal jugular vein felt to be  thrombus, less likely non-opacified blood mixing with opacified blood.  Skeleton: No acute bony change.  Other neck: Negative for adenopathy in the neck.  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  85% diameter stenosis proximal right internal carotid artery  Right vertebral artery widely patent. Mild to moderate stenosis distal left vertebral artery.  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 This is nonocclusive. There is also probable nonocclusive thrombus in the right jugular vein.   Electronically Signed   By: Franchot Gallo M.D.   On: 01/16/2015 08:41   [name]  01/15/2015   CLINICAL DATA:  Stroke.  Abnormal CT head  EXAM: MRI HEAD WITHOUT CONTRAST  MRA HEAD WITHOUT CONTRAST  TECHNIQUE: Multiplanar, multiecho pulse sequences of the brain and surrounding structures were obtained without intravenous contrast. Angiographic images of the head were obtained using MRA technique without contrast.  COMPARISON:  CT head 01/15/2015  FINDINGS: MRI HEAD FINDINGS  Negative for acute infarct. CT abnormalities in the brainstem are felt to be artifact on CT as this area appears normal on MRI. This is compatible with beam hardening artifact on CT.  Ventricle size normal.  Cerebral volume normal.  Pituitary not enlarged. Craniocervical junction normal. Paranasal sinuses are clear. Mastoid sinuses are clear.  Negative for acute or chronic ischemia. Negative for demyelinating disease.  Hyperintense signal in the left internal carotid artery compatible with occlusion.  Negative for intracranial hemorrhage.  No mass or edema identified.  MRA HEAD FINDINGS  Decreased signal distal left vertebral artery which is likely due to atherosclerotic disease and moderate stenosis. Distal right vertebral artery patent to the basilar. AICA patent bilaterally. Basilar widely patent. Superior  cerebellar and posterior cerebral arteries patent bilaterally.  Right cavernous carotid shows mild atherosclerotic disease without significant stenosis. 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 Left anterior and middle cerebral arteries are patent and supplied via the anterior communicating artery and a small left posterior communicating artery which is patent. Right posterior communicating artery also patent  Negative for cerebral aneurysm.  IMPRESSION: Negative for acute or chronic ischemic change. CT finding in the brainstem is an artifact.  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   Electronically Signed   By: Franchot Gallo M.D.   On: 01/15/2015 12:42   Mr Jodene Nam Head/brain Wo Cm  01/15/2015   CLINICAL DATA:  Stroke.  Abnormal CT head  EXAM: MRI HEAD WITHOUT CONTRAST  MRA HEAD WITHOUT CONTRAST  TECHNIQUE: Multiplanar, multiecho pulse sequences of the brain and surrounding structures were obtained without intravenous contrast. Angiographic images of the head were obtained using MRA technique without contrast.  COMPARISON:  CT head 01/15/2015  FINDINGS: MRI HEAD FINDINGS  Negative for acute infarct. CT abnormalities in the brainstem are felt to be artifact on CT as this area appears normal on MRI. This is compatible  with beam hardening artifact on CT.  Ventricle size normal.  Cerebral volume normal.  Pituitary not enlarged. Craniocervical junction normal. Paranasal sinuses are clear. Mastoid sinuses are clear.  Negative for acute or chronic ischemia. Negative for demyelinating disease.  Hyperintense signal in the left internal carotid artery compatible with occlusion.  Negative for intracranial hemorrhage.  No mass or edema identified.  MRA HEAD FINDINGS  Decreased signal distal left vertebral artery which is likely due to atherosclerotic  disease and moderate stenosis. Distal right vertebral artery patent to the basilar. AICA patent bilaterally. Basilar widely patent. Superior cerebellar and posterior cerebral arteries patent bilaterally.  Right cavernous carotid shows mild atherosclerotic disease without significant stenosis. 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 Left anterior and middle cerebral arteries are patent and supplied via the anterior communicating artery and a small left posterior communicating artery which is patent. Right posterior communicating artery also patent  Negative for cerebral aneurysm.  IMPRESSION: Negative for acute or chronic ischemic change. CT finding in the brainstem is an artifact.  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   Electronically Signed   By: Franchot Gallo M.D.   On: 01/15/2015 12:42    Assessment/Plan:   LOS: 2 days  s/p Procedure(s): TRANSESOPHAGEAL ECHOCARDIOGRAM (TEE)  Patient has 90% right ICA stenosis by CT angiography and carotid duplex exam. 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 Discussed risks and benefits with him today and he would like to proceed I scheduled him for Wednesday morning. 4 transesophageal echocardiogram today. No cardiac history. Patient also states he is ready to completely quit smoking.   Tinnie Gens, MD 01/17/2015 9:19 AM

## 2015-01-19 NOTE — Telephone Encounter (Signed)
Reviewed appointment has not been made. Forward to to scheduler. Will call patient once appointment is made.

## 2015-01-19 NOTE — Op Note (Signed)
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 Following induction of satisfactory general endotracheal anesthesia the right neck was prepped and draped in a routine sterile manner. Incision was made on the anterior border of the sternocleidomastoid muscle and carried down through the subcutaneous tissue and platysma using the Bovie. Care was taken not to injure the hypoglossal nerve.. The common internal and external carotid arteries were dissected free. There was a calcified atherosclerotic plaque at the carotid bifurcation extending up the internal carotid artery. A #10 shunt was then prepared and the patient was heparinized. The carotid vessels were occluded with vascular clamps. A longitudinal opening was made in the common carotid with a 15 blade extended up the internal carotid with the Potts scissors to a point distal to the disease. The plaque was approximately 90 % stenotic in severity. The distal vessel appeared normal. Shunt was inserted without difficulty reestablishing flow in about 2 minutes. A standard endarterectomy was performed with an eversion endarterectomy of the external carotid. The plaque feathered off  the distal internal carotid artery nicely not requiring any tacking sutures. The lumen was thoroughly irrigated with heparinized saline and loose debris all carefully removed. The arterotomy was then closed with a patch  using continuous 6-0 Prolene. Prior to completion of the  Closure the  shunt was removed after approximately 30 minutes of shunt time. Flow was then reestablished up the external branch initially followed by the internal branch. Protamine was given to her reverse the heparin.Following adequate hemostasis the wound was irrigated with saline and closed in layers with Vicryl ain a subcuticular fashion. Sterile dressing was applied and the patient taken to the recovery room in stable condition.  Tinnie Gens, MD 01/19/2015 1:44 PM

## 2015-01-19 NOTE — Telephone Encounter (Signed)
Pt is currently still in the hospital and will call once he has been discharged

## 2015-01-19 NOTE — Anesthesia Procedure Notes (Signed)
Procedure Name: Intubation Performed by: Mariea Clonts Preoxygenation: Pre-oxygenation with 100% oxygen Intubation Type: IV induction Ventilation: Mask ventilation without difficulty Laryngoscope Size: Miller and 2 Grade View: Grade I Tube type: Oral Tube size: 7.5 mm Number of attempts: 1 Placement Confirmation: ETT inserted through vocal cords under direct vision,  breath sounds checked- equal and bilateral and positive ETCO2 Secured at: 23 cm Tube secured with: Tape Dental Injury: Teeth and Oropharynx as per pre-operative assessment

## 2015-01-19 NOTE — Progress Notes (Signed)
STROKE TEAM PROGRESS NOTE    SUBJECTIVE (INTERVAL HISTORY) Wife and daughters are at bedside. Patient had a right CEA this afternoon. Currently awake alert follow commands. Heparin IV stopped, on aspirin. Discussed with Dr. early and will consider resuming anticoagulation in a.m.   OBJECTIVE Temp:  [97.6 F (36.4 C)-98.7 F (37.1 C)] 98.7 F (37.1 C) (05/25 1459) Pulse Rate:  [64-87] 64 (05/25 1459) Cardiac Rhythm:  [-] Normal sinus rhythm (05/25 1459) Resp:  [8-18] 11 (05/25 1459) BP: (117-148)/(78-107) 126/78 mmHg (05/25 1459) SpO2:  [92 %-99 %] 96 % (05/25 1459) Arterial Line BP: (151-167)/(69-82) 151/69 mmHg (05/25 1459)  No results for input(s): GLUCAP in the last 168 hours.  Recent Labs Lab 01/16/15 0341 01/17/15 0045 01/17/15 1928 01/18/15 0613 01/19/15 0515 01/19/15 1738  NA 138 137 137 138 138  --   K 3.9 3.9 3.7 4.7 4.4  --   CL 101 99* 100* 103 104  --   CO2 28 29 27 28 25   --   GLUCOSE 94 105* 136* 97 91  --   BUN 16 16 15 15 15   --   CREATININE 1.14 1.11 1.09 1.15 1.11 1.06  CALCIUM 9.1 8.9 9.3 9.8 9.2  --     Recent Labs Lab 01/15/15 0651 01/16/15 0341  AST 30 26  ALT 25 21  ALKPHOS 90 74  BILITOT 0.5 0.8  PROT 7.0 6.2*  ALBUMIN 4.1 3.5    Recent Labs Lab 01/15/15 0651  01/16/15 0341 01/17/15 0045 01/18/15 0613 01/19/15 0515 01/19/15 1738  WBC 9.2  --  10.3 9.5 8.9 8.7 12.7*  NEUTROABS 6.1  --   --   --   --   --   --   HGB 13.7  < > 14.2 13.8 14.3 13.7 12.3*  HCT 40.8  < > 42.2 40.7 42.6 40.1 36.6*  MCV 94.2  --  92.7 92.7 93.6 93.0 93.1  PLT 255  --  243 226 242 233 208  < > = values in this interval not displayed. No results for input(s): CKTOTAL, CKMB, CKMBINDEX, TROPONINI in the last 168 hours.  Recent Labs  01/17/15 1928  LABPROT 14.1  INR 1.07   No results for input(s): COLORURINE, LABSPEC, PHURINE, GLUCOSEU, HGBUR, BILIRUBINUR, KETONESUR, PROTEINUR, UROBILINOGEN, NITRITE, LEUKOCYTESUR in the last 72 hours.  Invalid  input(s): APPERANCEUR     Component Value Date/Time   CHOL 266* 01/16/2015 0341   CHOL 286* 10/12/2013 0436   TRIG 407* 01/16/2015 0341   TRIG 572* 10/12/2013 0436   TRIG 284* 07/15/2006 1158   HDL 37* 01/16/2015 0341   HDL 35* 10/12/2013 0436   CHOLHDL 7.2 01/16/2015 0341   CHOLHDL 5.9 CALC 07/15/2006 1158   VLDL UNABLE TO CALCULATE IF TRIGLYCERIDE OVER 400 mg/dL 01/16/2015 0341   LDLCALC UNABLE TO CALCULATE IF TRIGLYCERIDE OVER 400 mg/dL 01/16/2015 0341   LDLCALC NOT CALC 10/12/2013 0436   Lab Results  Component Value Date   HGBA1C 5.6 01/16/2015      Component Value Date/Time   LABOPIA POSITIVE* 01/15/2015 0825   COCAINSCRNUR NONE DETECTED 01/15/2015 0825   LABBENZ NONE DETECTED 01/15/2015 0825   AMPHETMU NONE DETECTED 01/15/2015 0825   THCU POSITIVE* 01/15/2015 0825   LABBARB NONE DETECTED 01/15/2015 0825     Recent Labs Lab 01/15/15 0651  ETH <5   I have personally reviewed the radiological images below and agree with the radiology interpretations.  Dg Chest 2 View my 01/15/2015    No acute cardiopulmonary  process.  Possible 7 mm right lower lobe nodule. Recommend correlation with chest CT in the non acute setting.     Ct Head Wo Contrast 01/15/2015    1. Possible ischemic changes in the pons, midbrain, and medulla without associated hemorrhage.  2. Age advanced mild bilateral carotid siphon atherosclerosis.  3. MRI of the brain without contrast may be helpful in further evaluation to confirm or deny the above findings.     MRI / MRA Brain Wo Contrast 01/15/2015    Negative for acute or chronic ischemic change.  CT finding in the brainstem is an artifact.   Occluded left internal carotid artery of indeterminate age   Moderately severe stenosis distal left vertebral artery.     CT angiogram of the head and neck 01/16/2015 1. Occlusion of the left common carotid artery and left internal carotid artery from the arch through the skullbase. 2. 85% diameter  stenosis proximal right internal carotid artery 3. Right vertebral artery widely patent. Mild to moderate stenosis distal left vertebral artery. 4. 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 This is nonocclusive. There is also probable nonocclusive thrombus in the right jugular vein.  Carotid duplex 01/15/2015 Right > 80% ICA stenosis at the level of the distal bulb into the proximal ICA.  Left - There appears to be an occlusion of the common carotid and Internal carotid artery.  The right vertebral artery is antegrade and the left carotid artery has a " bunny sign " consistent with a more proximal stenosis  2D echo - Procedure narrative: Transthoracic echocardiography. Imagequality was adequate. The study was technically difficult, as aresult of poor acoustic windows and poor sound wave transmission. - Left ventricle: The cavity size was normal. Systolic function wasnormal. The estimated ejection fraction was in the range of 55%to 60%. Wall motion was normal; there were no regional wallmotion abnormalities. Doppler parameters are consistent withabnormal left ventricular relaxation (grade 1 diastolicdysfunction). Doppler parameters are consistent with highventricular filling pressure. Mild to moderate concentric leftventricular hypertrophy. - Aortic valve: Mildly to moderately calcified annulus. Mildlythickened, mildly calcified leaflets. - Aorta: MIld aortic root dilatation. Aortic root dimension: 41 mm(ED). - Mitral valve: Mildly calcified annulus. Mildly thickened leaflets. There was mild regurgitation. - Left atrium: The atrium was severely dilated. Volume/bsa, ES,(1-plane Simpson&'s, A2C): 41 ml/m^2.  UE and LE venous doppler 01/18/2015 1. Since No DVT or superficial thrombosis noted in bilateral upper and lower extremities.   TEE  01/17/2015  FINDINGS:  Normal LV function, mild to moderate LVH. No intracardiac  mass/thrombus/vegetation. No right to left intracardiac shunt. Severe atherosclerosis of the aortic arch. A particularly large ulcerated plaque is seen in the distal arch, across from the origin of the arch vessels. There is a large and highly mobile component, likely source of embolic events.  Hypercoagulable work up pending   Pan CT  01/17/2015 No CT correlate to the previously questioned right lower lobe pulmonary parenchymal nodule, which may have represented nipple shadow or something overlying the patient. No acute abnormality within the chest, abdomen, or pelvis.  PHYSICAL EXAM  Temp:  [97.6 F (36.4 C)-98.7 F (37.1 C)] 98.7 F (37.1 C) (05/25 1459) Pulse Rate:  [64-87] 64 (05/25 1459) Resp:  [8-18] 11 (05/25 1459) BP: (117-148)/(78-107) 126/78 mmHg (05/25 1459) SpO2:  [92 %-99 %] 96 % (05/25 1459) Arterial Line BP: (151-167)/(69-82) 151/69 mmHg (05/25 1459)  General - Well nourished, well developed, in no apparent distress.  Ophthalmologic - Hervey Ard  disc margins OU.   Cardiovascular - Regular rate and rhythm with no murmur.  Mental Status -  Level of arousal and orientation to time, place, and person were intact. Language including expression, naming, repetition, comprehension was assessed and found intact. Attention span and concentration were normal. Recent and remote memory were intact. Fund of Knowledge was assessed and was intact.  Cranial Nerves II - XII - II - Visual field intact OU. III, IV, VI - Extraocular movements intact. V - Facial sensation intact bilaterally. VII - Facial movement intact bilaterally. VIII - Hearing & vestibular intact bilaterally. X - Palate elevates symmetrically. XI - Chin turning & shoulder shrug intact bilaterally. XII - Tongue protrusion intact.  Motor Strength - The patient's strength was normal in all extremities and pronator drift was absent.  Bulk was normal and fasciculations were absent.   Motor Tone - Muscle tone was  assessed at the neck and appendages and was normal.  Reflexes - The patient's reflexes were 1+ in all extremities and he had no pathological reflexes.  Sensory - Light touch, temperature/pinprick, vibration and proprioception, and Romberg testing were assessed and were symmetrical.    Coordination - The patient had normal movements in the hands and feet with no ataxia or dysmetria.  Tremor was absent.  Gait and Station - The patient's transfers, posture, gait, station, and turns were observed as normal.  ASSESSMENT/PLAN [name] is a 54 y.o. male with history of hypertension, ongoing tobacco use, hyperlipidemia, status post left carotid endarterectomy, obstructive sleep apnea, gastric ulcer with hemorrhage and obstruction, and diverticulosis presenting with multiple transient episodes of left-sided numbness and weakness as well as a mild headache, facial droop, and blurred vision. He did not receive IV t-PA due to late presentation.  TIA:  Non-dominant - right hemisphere  Resultant  resolved  MRI  No acute infarct  MRA  Left ICA occlusion  Carotid Doppler  Right >80% ICA stenosis  2-D echocardiogram no source of embolus  Venous Doppler no DVT in upper and lower extremities.  Pan CT negative for malignancy.  CTA showed left CCA occlusion, right ICA 85% stenosis, bilateral jugular vein thrombus  TEE - Severe atherosclerosis of the aortic arch. A particularly large ulcerated plaque is seen in the distal arch, across from the origin of the arch vessels. There is a large and highly mobile component, likely source of embolic events.  LDL - unable to calculate secondary to triglycerides greater than 400  HgbA1c 5.6  Hypercoagulable and autoimmune workup so far negative while some are pending  Heparin for VTE prophylaxis Diet clear liquid Room service appropriate?: Yes; Fluid consistency:: Thin  no antithrombotic prior to admission, now on heparin. Consider to resume  anticoagulation in the a.m. after discussion with Dr. Kellie Simmering.   Ongoing aggressive stroke risk factor management  Therapy recommendations: No follow-up PT  Disposition: Pending  Bilateral jugular vein thrombosis  Etiology not clear  venous doppler of LEs and UEs - negative for DVT  Hypercoagulable work up thus far negative, workup pending  pan CT negative for malignancy  Consider to resume anticoagulation in a.m. after discussion with Dr. Kellie Simmering  Carotid occlusion/stenosis  Left CCA occlusion  S/p left ICA CEA but no occluded - likely due to emboli instead of plaque formation  Right ICA 85% stenosis  Right CEA done today  Consider to resume anticoagulation in a.m. after discussion with Dr. Kellie Simmering  Hypertension  Home meds: Norvasc, losartan, and metoprolol  Post CEA, BP  goal normotensive  On amlodipine 5 mg  Close monitoring  Hyperlipidemia  Home meds:  No lipid lowering medications prior to admission  LDL unable to calculate, goal < 70  Zocor and fenofibrate added  Continue statin at discharge  Tobacco abuse  Current smoker  Smoking cessation counseling provided  Nicotine patch provided  Pt is willing to quit  Other Stroke Risk Factors  UDS positive for opiates and THC. On Vicodin.  Obesity, Body mass index is 29.83 kg/(m^2).   Family hx stroke (father and uncle)  OSA - not on CPAP  Other Active Problems  Back pain - Vicodin  Hospital day # 4   Rosalin Hawking, MD PhD Stroke Neurology 01/19/2015 6:35 PM  To contact Stroke Continuity provider, please refer to [email or web address] After hours, contact General Neurology

## 2015-01-19 NOTE — Anesthesia Preprocedure Evaluation (Signed)
Anesthesia Evaluation  Patient identified by MRN, date of birth, ID band Patient awake    Reviewed: Allergy & Precautions, NPO status , Patient's Chart, lab work & pertinent test results  Airway Mallampati: II   Neck ROM: full    Dental   Pulmonary sleep apnea and Continuous Positive Airway Pressure Ventilation , Current Smoker,  breath sounds clear to auscultation        Cardiovascular hypertension, + Peripheral Vascular Disease Rhythm:regular Rate:Normal     Neuro/Psych TIACVA    GI/Hepatic PUD,   Endo/Other    Renal/GU      Musculoskeletal  (+) Arthritis -,   Abdominal   Peds  Hematology   Anesthesia Other Findings   Reproductive/Obstetrics                             Anesthesia Physical Anesthesia Plan  ASA: II  Anesthesia Plan: General   Post-op Pain Management:    Induction: Intravenous  Airway Management Planned: Oral ETT  Additional Equipment: Arterial line  Intra-op Plan:   Post-operative Plan: Extubation in OR  Informed Consent: I have reviewed the patients History and Physical, chart, labs and discussed the procedure including the risks, benefits and alternatives for the proposed anesthesia with the patient or authorized representative who has indicated his/her understanding and acceptance.     Plan Discussed with: CRNA, Anesthesiologist and Surgeon  Anesthesia Plan Comments:         Anesthesia Quick Evaluation

## 2015-01-19 NOTE — Interval H&P Note (Signed)
History and Physical Interval Note:  01/19/2015 10:50 AM  Martin Archer  has presented today for surgery, with the diagnosis of Right carotid stensosis with history of cerebrovascular accident I65.239  The various methods of treatment have been discussed with the patient and family. After consideration of risks, benefits and other options for treatment, the patient has consented to  Procedure(s): ENDARTERECTOMY CAROTID (Right) as a surgical intervention .  The patient's history has been reviewed, patient examined, no change in status, stable for surgery.  I have reviewed the patient's chart and labs.  Questions were answered to the patient's satisfaction.     Tinnie Gens

## 2015-01-20 ENCOUNTER — Encounter (HOSPITAL_COMMUNITY): Payer: Self-pay | Admitting: Vascular Surgery

## 2015-01-20 ENCOUNTER — Other Ambulatory Visit: Payer: Self-pay | Admitting: Neurology

## 2015-01-20 DIAGNOSIS — G451 Carotid artery syndrome (hemispheric): Secondary | ICD-10-CM

## 2015-01-20 DIAGNOSIS — G458 Other transient cerebral ischemic attacks and related syndromes: Secondary | ICD-10-CM

## 2015-01-20 LAB — PROTHROMBIN GENE MUTATION

## 2015-01-20 LAB — CBC
HCT: 38.7 % — ABNORMAL LOW (ref 39.0–52.0)
HEMOGLOBIN: 13 g/dL (ref 13.0–17.0)
MCH: 31.5 pg (ref 26.0–34.0)
MCHC: 33.6 g/dL (ref 30.0–36.0)
MCV: 93.7 fL (ref 78.0–100.0)
PLATELETS: 212 10*3/uL (ref 150–400)
RBC: 4.13 MIL/uL — AB (ref 4.22–5.81)
RDW: 12.5 % (ref 11.5–15.5)
WBC: 10 10*3/uL (ref 4.0–10.5)

## 2015-01-20 LAB — BASIC METABOLIC PANEL
Anion gap: 9 (ref 5–15)
BUN: 12 mg/dL (ref 6–20)
CALCIUM: 8.7 mg/dL — AB (ref 8.9–10.3)
CHLORIDE: 101 mmol/L (ref 101–111)
CO2: 25 mmol/L (ref 22–32)
Creatinine, Ser: 1 mg/dL (ref 0.61–1.24)
GFR calc Af Amer: >60 mL/min (ref >60)
Glucose, Bld: 111 mg/dL — ABNORMAL HIGH (ref 65–99)
Potassium: 3.9 mmol/L (ref 3.5–5.1)
SODIUM: 135 mmol/L (ref 135–145)

## 2015-01-20 LAB — HOMOCYSTEINE: Homocysteine: 6.5 umol/L (ref 0.0–15.0)

## 2015-01-20 MED ORDER — ASPIRIN EC 81 MG PO TBEC: 81.0000 mg | DELAYED_RELEASE_TABLET | Freq: Every day | ORAL | Status: DC

## 2015-01-20 MED ORDER — ENOXAPARIN SODIUM 40 MG/0.4ML ~~LOC~~ SOLN
40.0000 mg | SUBCUTANEOUS | Status: DC
  Filled 2015-01-20: qty 0.4

## 2015-01-20 MED ORDER — METOPROLOL TARTRATE 25 MG PO TABS
25.0000 mg | ORAL_TABLET | Freq: Two times a day (BID) | ORAL | Status: DC
  Administered 2015-01-20: 25 mg via ORAL
  Filled 2015-01-20 (×2): qty 1

## 2015-01-20 MED ORDER — CLOPIDOGREL BISULFATE 75 MG PO TABS: 75.0000 mg | ORAL_TABLET | Freq: Every day | ORAL | Status: DC

## 2015-01-20 MED ORDER — HYDROCODONE-ACETAMINOPHEN 5-325 MG PO TABS: 1.0000 | ORAL_TABLET | Freq: Four times a day (QID) | ORAL | Status: DC | PRN

## 2015-01-20 MED ORDER — ATORVASTATIN CALCIUM 80 MG PO TABS: 80.0000 mg | ORAL_TABLET | Freq: Every day | ORAL | Status: DC

## 2015-01-20 NOTE — Progress Notes (Addendum)
[facility]  Subjective  - BP pressures eleavated 178/133 with a HA complaint since surgery.  He has 2 episodes of vomiting with dizziness when he got up this am and was given 2 doses Labetalol.  His pressure is 98/70 currently and his HA feels better.  He is sitting up in the chair without dizziness currently.   Objective 98/70 87 97.1 F (36.2 C) (Oral) 21 90%  Intake/Output Summary (Last 24 hours) at 01/20/15 0728 Last data filed at 01/20/15 0657  Gross per 24 hour  Intake   1980 ml  Output   2125 ml  Net   -145 ml    Grip 5/5 equal, palpable radial pulses equal bil. Incision is clean and dry right side droop due to marginal mandibular nerve paresi, no tongue deviation Heart RRR Lungs non labored breathing  Assessment/Planning: POD # right CEA  He has had a "ruff" morning, but now he is feeling better We will start his home medication this am. I will ck on him later today.   Laurence Slate [facility] 01/20/2015 7:28 AM --  Laboratory Lab Results:  Recent Labs  01/19/15 1738 01/20/15 0545  WBC 12.7* 10.0  HGB 12.3* 13.0  HCT 36.6* 38.7*  PLT 208 212   BMET  Recent Labs  01/19/15 0515 01/19/15 1738 01/20/15 0545  NA 138  --  135  K 4.4  --  3.9  CL 104  --  101  CO2 25  --  25  GLUCOSE 91  --  111*  BUN 15  --  12  CREATININE 1.11 1.06 1.00  CALCIUM 9.2  --  8.7*    COAG Lab Results  Component Value Date   INR 1.07 01/17/2015   INR 0.93 01/15/2015   No results found for: PTT Right neck incision looks good with minimal edema Neurologic exam normal with the exception of mild right marginal mandibular nerve paresis Excellent strength bilaterally  Headache has improved significantly and patient will ambulate in hall and see how appetite is Possible DC home later today if patient feeling okay Return in 2 weeks to see me

## 2015-01-20 NOTE — Progress Notes (Signed)
Rn called to pt room around 0530 with complaints of headache, nausea and dizziness with moevemnt. BP=178/133 at that time then started vomiting. Pt neuro intact with mild facial swelling and complaints of pain at incision site. Given dose of Labetalol per orders along with nausea and pain meds (see MAR).  [id number] Rechecked BP=157/106 still with headache and intermittent nausea. Second dose of Labetalol given with relief of symptoms. MD made aware with morning rounds and dayshift RN aware

## 2015-01-20 NOTE — Progress Notes (Signed)
Provided patient with discharge instructions. PIV removed. No signs of infection at surgical site.

## 2015-01-20 NOTE — Progress Notes (Signed)
STROKE TEAM PROGRESS NOTE    SUBJECTIVE (INTERVAL HISTORY) Wife is at bedside. Patient had a right CEA yesterday, doing well. Waiting for d/c today. Discussed with Dr. Scot Dock, pt does not need anticoagulation.    OBJECTIVE Temp:  [97.1 F (36.2 C)-98.6 F (37 C)] 98.3 F (36.8 C) (05/26 1153) Pulse Rate:  [83-113] 87 (05/26 0655) Cardiac Rhythm:  [-] Normal sinus rhythm (05/26 1153) Resp:  [13-21] 15 (05/26 1025) BP: (98-178)/(70-133) 102/85 mmHg (05/26 1153) SpO2:  [90 %-96 %] 94 % (05/26 1153) Arterial Line BP: (220-280)/(116-145) 220/118 mmHg (05/26 0555)  No results for input(s): GLUCAP in the last 168 hours.  Recent Labs Lab 01/17/15 0045 01/17/15 1928 01/18/15 0613 01/19/15 0515 01/19/15 1738 01/20/15 0545  NA 137 137 138 138  --  135  K 3.9 3.7 4.7 4.4  --  3.9  CL 99* 100* 103 104  --  101  CO2 29 27 28 25   --  25  GLUCOSE 105* 136* 97 91  --  111*  BUN 16 15 15 15   --  12  CREATININE 1.11 1.09 1.15 1.11 1.06 1.00  CALCIUM 8.9 9.3 9.8 9.2  --  8.7*    Recent Labs Lab 01/15/15 0651 01/16/15 0341  AST 30 26  ALT 25 21  ALKPHOS 90 74  BILITOT 0.5 0.8  PROT 7.0 6.2*  ALBUMIN 4.1 3.5    Recent Labs Lab 01/15/15 0651  01/17/15 0045 01/18/15 0613 01/19/15 0515 01/19/15 1738 01/20/15 0545  WBC 9.2  < > 9.5 8.9 8.7 12.7* 10.0  NEUTROABS 6.1  --   --   --   --   --   --   HGB 13.7  < > 13.8 14.3 13.7 12.3* 13.0  HCT 40.8  < > 40.7 42.6 40.1 36.6* 38.7*  MCV 94.2  < > 92.7 93.6 93.0 93.1 93.7  PLT 255  < > 226 242 233 208 212  < > = values in this interval not displayed. No results for input(s): CKTOTAL, CKMB, CKMBINDEX, TROPONINI in the last 168 hours. No results for input(s): LABPROT, INR in the last 72 hours. No results for input(s): COLORURINE, LABSPEC, [place], GLUCOSEU, HGBUR, BILIRUBINUR, KETONESUR, PROTEINUR, UROBILINOGEN, NITRITE, LEUKOCYTESUR in the last 72 hours.  Invalid input(s): APPERANCEUR     Component Value Date/Time   CHOL 266*  01/16/2015 0341   CHOL 286* 10/12/2013 0436   TRIG 407* 01/16/2015 0341   TRIG 572* 10/12/2013 0436   TRIG 284* 07/15/2006 1158   HDL 37* 01/16/2015 0341   HDL 35* 10/12/2013 0436   CHOLHDL 7.2 01/16/2015 0341   CHOLHDL 5.9 CALC 07/15/2006 1158   VLDL UNABLE TO CALCULATE IF TRIGLYCERIDE OVER 400 mg/dL 01/16/2015 0341   LDLCALC UNABLE TO CALCULATE IF TRIGLYCERIDE OVER 400 mg/dL 01/16/2015 0341   LDLCALC NOT CALC 10/12/2013 0436   Lab Results  Component Value Date   HGBA1C 5.6 01/16/2015      Component Value Date/Time   LABOPIA POSITIVE* 01/15/2015 0825   COCAINSCRNUR NONE DETECTED 01/15/2015 0825   LABBENZ NONE DETECTED 01/15/2015 0825   AMPHETMU NONE DETECTED 01/15/2015 0825   THCU POSITIVE* 01/15/2015 0825   LABBARB NONE DETECTED 01/15/2015 0825     Recent Labs Lab 01/15/15 0651  ETH <5   I have personally reviewed the radiological images below and agree with the radiology interpretations.  Dg Chest 2 View my 01/15/2015    No acute cardiopulmonary process.  Possible 7 mm right lower lobe nodule. Recommend correlation  with chest CT in the non acute setting.     Ct Head Wo Contrast 01/15/2015    1. Possible ischemic changes in the pons, midbrain, and medulla without associated hemorrhage.  2. Age advanced mild bilateral carotid siphon atherosclerosis.  3. MRI of the brain without contrast may be helpful in further evaluation to confirm or deny the above findings.     MRI / MRA Brain Wo Contrast 01/15/2015    Negative for acute or chronic ischemic change.  CT finding in the brainstem is an artifact.   Occluded left internal carotid artery of indeterminate age   Moderately severe stenosis distal left vertebral artery.     CT angiogram of the head and neck 01/16/2015 1. Occlusion of the left common carotid artery and left internal carotid artery from the arch through the skullbase. 2. 85% diameter stenosis proximal right internal carotid artery 3. Right vertebral  artery widely patent. Mild to moderate stenosis distal left vertebral artery. 4. 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 This is nonocclusive. There is also probable nonocclusive thrombus in the right jugular vein.  Carotid duplex 01/15/2015 Right > 80% ICA stenosis at the level of the distal bulb into the proximal ICA.  Left - There appears to be an occlusion of the common carotid and Internal carotid artery.  The right vertebral artery is antegrade and the left carotid artery has a " bunny sign " consistent with a more proximal stenosis  2D echo - Procedure narrative: Transthoracic echocardiography. Imagequality was adequate. The study was technically difficult, as aresult of poor acoustic windows and poor sound wave transmission. - Left ventricle: The cavity size was normal. Systolic function wasnormal. The estimated ejection fraction was in the range of 55%to 60%. Wall motion was normal; there were no regional wallmotion abnormalities. Doppler parameters are consistent withabnormal left ventricular relaxation (grade 1 diastolicdysfunction). Doppler parameters are consistent with highventricular filling pressure. Mild to moderate concentric leftventricular hypertrophy. - Aortic valve: Mildly to moderately calcified annulus. Mildlythickened, mildly calcified leaflets. - Aorta: MIld aortic root dilatation. Aortic root dimension: 41 mm(ED). - Mitral valve: Mildly calcified annulus. Mildly thickened leaflets. There was mild regurgitation. - Left atrium: The atrium was severely dilated. Volume/bsa, ES,(1-plane Simpson&'s, A2C): 41 ml/m^2.  UE and LE venous doppler 01/18/2015 1. Since No DVT or superficial thrombosis noted in bilateral upper and lower extremities.   TEE  01/17/2015  FINDINGS:  Normal LV function, mild to moderate LVH. No intracardiac mass/thrombus/vegetation. No right to left intracardiac shunt. Severe  atherosclerosis of the aortic arch. A particularly large ulcerated plaque is seen in the distal arch, across from the origin of the arch vessels. There is a large and highly mobile component, likely source of embolic events.  Hypercoagulable and autoimmue work up - negative so far  Pan CT  01/17/2015 No CT correlate to the previously questioned right lower lobe pulmonary parenchymal nodule, which may have represented nipple shadow or something overlying the patient. No acute abnormality within the chest, abdomen, or pelvis.  PHYSICAL EXAM  Temp:  [97.1 F (36.2 C)-98.6 F (37 C)] 98.3 F (36.8 C) (05/26 1153) Pulse Rate:  [83-113] 87 (05/26 0655) Resp:  [13-21] 15 (05/26 1025) BP: (98-178)/(70-133) 102/85 mmHg (05/26 1153) SpO2:  [90 %-96 %] 94 % (05/26 1153) Arterial Line BP: (220-280)/(116-145) 220/118 mmHg (05/26 0555)  General - Well nourished, well developed, in no apparent distress.  Ophthalmologic - Sharp disc margins OU.   Cardiovascular -  Regular rate and rhythm with no murmur.  Mental Status -  Level of arousal and orientation to time, place, and person were intact. Language including expression, naming, repetition, comprehension was assessed and found intact. Attention span and concentration were normal. Recent and remote memory were intact. Fund of Knowledge was assessed and was intact.  Cranial Nerves II - XII - II - Visual field intact OU. III, IV, VI - Extraocular movements intact. V - Facial sensation intact bilaterally. VII - Facial movement intact bilaterally. VIII - Hearing & vestibular intact bilaterally. X - Palate elevates symmetrically. XI - Chin turning & shoulder shrug intact bilaterally. XII - Tongue protrusion intact.  Motor Strength - The patient's strength was normal in all extremities and pronator drift was absent.  Bulk was normal and fasciculations were absent.   Motor Tone - Muscle tone was assessed at the neck and appendages and was  normal.  Reflexes - The patient's reflexes were 1+ in all extremities and he had no pathological reflexes.  Sensory - Light touch, temperature/pinprick, vibration and proprioception, and Romberg testing were assessed and were symmetrical.    Coordination - The patient had normal movements in the hands and feet with no ataxia or dysmetria.  Tremor was absent.  Gait and Station - The patient's transfers, posture, gait, station, and turns were observed as normal.  ASSESSMENT/PLAN [name] is a 54 y.o. male with history of hypertension, ongoing tobacco use, hyperlipidemia, status post left carotid endarterectomy, obstructive sleep apnea, gastric ulcer with hemorrhage and obstruction, and diverticulosis presenting with multiple transient episodes of left-sided numbness and weakness as well as a mild headache, facial droop, and blurred vision. He did not receive IV t-PA due to late presentation.  TIA:  Non-dominant - right hemisphere  Resultant  resolved  MRI  No acute infarct  MRA  Left ICA occlusion  Carotid Doppler  Right >80% ICA stenosis  2-D echocardiogram no source of embolus  Venous Doppler no DVT in upper and lower extremities.  Pan CT negative for malignancy.  CTA showed left CCA occlusion, right ICA 85% stenosis, bilateral jugular vein thrombus  TEE - Severe atherosclerosis of the aortic arch. A particularly large ulcerated plaque is seen in the distal arch, across from the origin of the arch vessels. There is a large and highly mobile component, likely source of embolic events.  LDL - unable to calculate secondary to triglycerides greater than 400  HgbA1c 5.6  Hypercoagulable and autoimmune workup so far negative   Heparin for VTE prophylaxis  no antithrombotic prior to admission, now on ASA. Discussed with Dr. Scot Dock and will not need anticoagulation. Recommend dural antiplatelet for 3 months and then plavix alone.     Ongoing aggressive stroke risk  factor management  Therapy recommendations: No follow-up PT  Disposition: Pending  Bilateral jugular vein thrombosis  Etiology not clear  venous doppler of LEs and UEs - negative for DVT  Hypercoagulable work up thus far negative  pan CT negative for malignancy  Dural antiplatelet for 3 months and the plaix alone    Carotid occlusion/stenosis  Left CCA occlusion  S/p left ICA CEA but no occluded - likely due to emboli instead of plaque formation  Right ICA 85% stenosis  Right CEA done   Hypertension  Home meds: Norvasc, losartan, and metoprolol  Post CEA, BP goal normotensive  On amlodipine 5 mg  Close monitoring  Hyperlipidemia  Home meds:  No lipid lowering medications prior to admission  LDL  unable to calculate, goal < 70  Zocor and fenofibrate added  Continue statin at discharge  Tobacco abuse  Current smoker  Smoking cessation counseling provided  Nicotine patch provided  Pt is willing to quit  Other Stroke Risk Factors  UDS positive for opiates and THC. On Vicodin.  Obesity, Body mass index is 29.83 kg/(m^2).   Family hx stroke (father and uncle)  OSA - not on CPAP  Other Active Problems  Back pain - Vicodin  Hospital day # 5  Neurology will sign off. Please call with questions. Pt will follow up with Dr. Erlinda Hong at [facility] in about 2 months. Thanks for the consult.  Rosalin Hawking, MD PhD Stroke Neurology 01/20/2015 11:42 PM  To contact Stroke Continuity provider, please refer to [email or web address] After hours, contact General Neurology

## 2015-01-20 NOTE — Discharge Instructions (Signed)
Information on my medicine - ELIQUIS (apixaban)  This medication education was reviewed with me or my healthcare representative as part of my discharge preparation.  The pharmacist that spoke with me during my hospital stay was:  Sylvie Mifsud Kay, RPH  Why was Eliquis prescribed for you? Eliquis was prescribed to treat blood clots that may have been found in the veins of your legs (deep vein thrombosis) or in your lungs (pulmonary embolism) and to reduce the risk of them occurring again.  What do You need to know about Eliquis ? The starting dose is 10 mg (two 5 mg tablets) taken TWICE daily for the FIRST SEVEN (7) DAYS, then  the dose is reduced to ONE 5 mg tablet taken TWICE daily.  Eliquis may be taken with or without food.   Try to take the dose about the same time in the morning and in the evening. If you have difficulty swallowing the tablet whole please discuss with your pharmacist how to take the medication safely.  Take Eliquis exactly as prescribed and DO NOT stop taking Eliquis without talking to the doctor who prescribed the medication.  Stopping may increase your risk of developing a new blood clot.  Refill your prescription before you run out.  After discharge, you should have regular check-up appointments with your healthcare provider that is prescribing your Eliquis.    What do you do if you miss a dose? If a dose of ELIQUIS is not taken at the scheduled time, take it as soon as possible on the same day and twice-daily administration should be resumed. The dose should not be doubled to make up for a missed dose.  Important Safety Information A possible side effect of Eliquis is bleeding. You should call your healthcare provider right away if you experience any of the following: Bleeding from an injury or your nose that does not stop. Unusual colored urine (red or dark brown) or unusual colored stools (red or black). Unusual bruising for unknown reasons. A serious  fall or if you hit your head (even if there is no bleeding).  Some medicines may interact with Eliquis and might increase your risk of bleeding or clotting while on Eliquis. To help avoid this, consult your healthcare provider or pharmacist prior to using any new prescription or non-prescription medications, including herbals, vitamins, non-steroidal anti-inflammatory drugs (NSAIDs) and supplements.  This website has more information on Eliquis (apixaban): http://www.eliquis.com/eliquis/home  

## 2015-01-20 NOTE — Progress Notes (Signed)
UR COMPLETED  

## 2015-01-21 NOTE — Discharge Summary (Signed)
[facility] Discharge Summary   Patient ID:  Martin Archer MRN: [id number] DOB/AGE: 56-Feb-1962 53 y.o.  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 Gastric ulcer with hemorrhage and obstruction 2011    Dr Fidela [facility]  . Anemia, unspecified   . Personal history of colonic polyps 01/30/2010    hyperplastic rectal  . Diverticulosis of colon (without mention of hemorrhage)   . Sleep apnea     on CPAP; [facility]  . Hypertension   . Hyperlipemia   . Arthritis     Procedure(s): RIGHT CAROTID ENDARTERECTOMY WITH PATCH ANGIOPLASTY  Discharged Condition: good  HPI: Martin Archer is a 54 y.o. male who was admitted on 01/15/15 with the sudden onset of left arm and left leg weakness. He also had some left sided facial droop. The symptoms lasted around 30 minutes. He was not on ASA. He also states that at the same time he would have paralysis of his right arm that also would resolve completely after 30 minutes. He states that he's had multiple similar episodes over the last several months. These all passed proximally 30 minutes.  He underwent a Left CEA around 8 years ago by Dr. Kellie Simmering. He apparently was lost to follow up.   His risk factors for vascular disease include hypertension, hypercholesterolemia, and tobacco use. He denies any history of diabetes or family history of premature cardiovascular disease.  He denies any history of myocardial infarction, congestive heart failure.   Hospital Course:  Martin Archer is a 54 y.o. male is  S/P 01/19/2015 Procedure(s): RIGHT CAROTID ENDARTERECTOMY WITH [facility] course: Neurology consulted work up for stroke.  As of 01/17/2015 Clinical improvement. Symptoms resolved. CT head negative. MRI head no acute infarct. MRA head left ICA occlusion. Carotid Dopplers with greater than 80% right ICA stenosis. 2-D echo with normal EF with severely dilated left atrium. No source of emboli noted. CT angiogram showed left CCA occlusion, right ICA 85% stenosis, bilateral jugular venous thrombosis. LDL was unable to be [place] secondary to triglycerides greater than 400. Hemoglobin A1c pending. Patient was not on any antithrombotic agent prior to admission. Patient currently on IV heparin for secondary stroke prophylaxis. TEE with normal LV function mild to moderate LVH, no intracardiac mass/thrombus/vegetation, no right-to-left intracardiac shunt. Severe atherosclerosis of the aortic arch. Particularly large ulcerated plaque seen in the distal arch across from the origin of the arch vessels. Large and highly mobile component likely source of embolic events.  CT angiogram of the head and neck 01/16/2015 1. Occlusion of the left common carotid artery and left internal carotid artery from the arch through the skullbase. 2. 85% diameter stenosis proximal right internal carotid artery 3. Right vertebral artery widely patent. Mild to moderate stenosis distal left vertebral artery. 4. 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 This is nonocclusive. There is also probable nonocclusive thrombus in the right jugular vein.  This was followed by UE and LE duplex. UE and LE venous doppler 01/18/2015  Since No DVT or superficial thrombosis noted in bilateral upper and lower extremities.  Summary: No evidence of deep  vein or superficial thrombosis involving the right upper extremity and left upper extremity.  Other specific details can be found in the  table(s) above. Prepared and Electronically Authenticated by  Gae Gallop MD He was discharged in stable condition.  Dr. Erlinda Hong discussed with Dr. Scot Dock, pt does not need anticoagulation. The patient was discharged on 01/20/2015 on Hydrocodone for pain control, metoprolol 25 mg po BID, Norvasc 5 mg QD, Lipitor 80 mg QD new at discharge, Plavix 75 QD starting 01/21/2015 new at discharge, and Aspirin 81 mg QD new at discharge.   Treatment Team:  Mal Misty, MD Jodi Marble, MD  Significant Diagnostic Studies: CBC Lab Results  Component Value Date   WBC 10.0 01/20/2015   HGB 13.0 01/20/2015   HCT 38.7* 01/20/2015   MCV 93.7 01/20/2015   PLT 212 01/20/2015    BMET    Component Value Date/Time   NA 135 01/20/2015 0545   K 3.9 01/20/2015 0545   CL 101 01/20/2015 0545   CO2 25 01/20/2015 0545   GLUCOSE 111* 01/20/2015 0545   BUN 12 01/20/2015 0545   CREATININE 1.00 01/20/2015 0545   CALCIUM 8.7* 01/20/2015 0545   GFRNONAA >60 01/20/2015 0545   GFRAA >60 01/20/2015 0545   COAG Lab Results  Component Value Date   INR 1.07 01/17/2015   INR 0.93 01/15/2015     Disposition:  Discharge to :Home Discharge Instructions    Call MD for:  redness, tenderness, or signs of infection (pain, swelling, bleeding, redness, odor or green/yellow discharge around incision site)    Complete by:  As directed      Call MD for:  severe or increased pain, loss or decreased feeling  in affected limb(s)    Complete by:  As directed      Call MD for:  temperature >100.5    Complete by:  As directed      Discharge instructions    Complete by:  As directed   You may shower     Driving Restrictions    Complete by:  As directed   No driving for 2 weeks     Increase activity slowly    Complete by:  As directed   Walk with assistance use walker or cane as needed     Lifting restrictions    Complete by:  As directed   No lifting for 6 weeks     Resume previous diet    Complete by:  As  directed             Medication List    TAKE these medications        amLODipine 5 MG tablet  Commonly known as:  NORVASC  Take 1 tablet (5 mg total) by mouth daily.     aspirin EC 81 MG tablet  Take 1 tablet (81 mg total) by mouth daily.     atorvastatin 80 MG tablet  Commonly known as:  LIPITOR  Take 1 tablet (80 mg total) by mouth daily.     clopidogrel 75 MG tablet  Commonly known as:  PLAVIX  Take 1 tablet (75 mg total) by mouth daily.     HYDROcodone-acetaminophen 5-325 MG per tablet  Commonly known as:  NORCO/VICODIN  Take 1 tablet by mouth every 6 (six) hours as needed for moderate pain.     HYDROcodone-acetaminophen 5-325 MG per tablet  Commonly known as:  NORCO/VICODIN  Take 1 tablet by mouth every 6 (six) hours as needed for moderate  pain.     losartan 100 MG tablet  Commonly known as:  COZAAR  Take 1 tablet (100 mg total) by mouth daily.     metoprolol tartrate 25 MG tablet  Commonly known as:  LOPRESSOR  Take 1 tablet (25 mg total) by mouth 2 (two) times daily.     MULTIVITAL PO  Take 1 tablet by mouth daily.     thiamine 100 MG tablet  Commonly known as:  VITAMIN B-1  Take 100 mg by mouth daily.       Verbal and written Discharge instructions given to the patient. Wound care per Discharge AVS   Signed: Laurence Slate [facility] 01/21/2015, 10:09 AM  --- For VQI Registry use --- Instructions: Press F2 to tab through selections.  Delete question if not applicable.   Modified Rankin score at D/C (0-6): Rankin Score=0  IV medication needed for:  1. Hypertension: Yes 2. Hypotension: No  Post-op Complications: No  1. Post-op CVA or TIA: No  If yes: Event classification (right eye, left eye, right cortical, left cortical, verterobasilar, other):   If yes: Timing of event (intra-op, <6 hrs post-op, >=6 hrs post-op, unknown):   2. CN injury: No  If yes: CN  injuried   3. Myocardial infarction: No  If yes: Dx by (EKG or clinical,  Troponin):   4.  CHF: No  5.  Dysrhythmia (new): No  6. Wound infection: No  7. Reperfusion symptoms: No  8. Return to OR: No  If yes: return to OR for (bleeding, neurologic, other CEA incision, other):   Discharge medications: Statin use:  Yes ASA use:  Yes Beta blocker use:  yes ACE-Inhibitor use:  No  for medical reason   P2Y12 Antagonist use: [ ]  None, [x ] Plavix, [ ]  Plasugrel, [ ]  Ticlopinine, [ ]  Ticagrelor, [ ]  Other, [ ]  No for medical reason, [ ]  Non-compliant, [ ]  Not-indicated Anti-coagulant use:  [x ] None, [ ]  Warfarin, [ ]  Rivaroxaban, [ ]  Dabigatran, [ ]  Other, [ ]  No for medical reason, [ ]  Non-compliant, [ ]  Not-indicated

## 2015-01-25 ENCOUNTER — Encounter (HOSPITAL_COMMUNITY): Payer: Self-pay | Admitting: Cardiovascular Disease

## 2015-01-26 ENCOUNTER — Telehealth: Payer: Self-pay | Admitting: Vascular Surgery

## 2015-01-26 NOTE — Telephone Encounter (Signed)
Spoke with patient to schedule, dpm

## 2015-01-26 NOTE — Telephone Encounter (Signed)
-----   Message from Mena Goes, RN sent at 01/20/2015  3:27 PM EDT ----- Regarding: Schedule   ----- Message -----    From: Ulyses Amor, PA-C    Sent: 01/20/2015   3:09 PM      To: [facility] Charge Pool  F/u in 2 weeks with Dr. Kellie Simmering s/p Right CEA

## 2015-01-28 ENCOUNTER — Other Ambulatory Visit: Payer: Self-pay | Admitting: Physician Assistant

## 2015-01-28 NOTE — Telephone Encounter (Signed)
Patient sent MyChart rx refill request for Hydrocodone- he is S/p Right CEA on 01-19-15 by Dr. Kellie Simmering.  I called him and he states that he does have 8 pills left and is taking 1 tablet every 6-8 hours for pain level of 4 on average. He is afebrile and reports no sign of drainage or infection. Wound incision is only slightly swollen. He has had no problems with chewing, swallowing or drinking fluids. His next appt with Dr. Kellie Simmering is on Tuesday 02-01-15. I told him to try and stretch his opioid medication by using Ibuprofen OTC in the daytime if possible. I also reviewed S&S of infection and he will call us back if he has any issues over the weekend. He voiced understanding and agreement of this plan. He will see Dr. Kellie Simmering on Tuesday.

## 2015-01-31 ENCOUNTER — Encounter: Payer: Self-pay | Admitting: Vascular Surgery

## 2015-02-01 ENCOUNTER — Ambulatory Visit (INDEPENDENT_AMBULATORY_CARE_PROVIDER_SITE_OTHER): Payer: Self-pay | Admitting: Vascular Surgery

## 2015-02-01 ENCOUNTER — Encounter: Payer: Self-pay | Admitting: Vascular Surgery

## 2015-02-01 ENCOUNTER — Encounter: Payer: Self-pay | Admitting: Internal Medicine

## 2015-02-01 ENCOUNTER — Ambulatory Visit (INDEPENDENT_AMBULATORY_CARE_PROVIDER_SITE_OTHER): Payer: 59 | Admitting: Internal Medicine

## 2015-02-01 VITALS — BP 152/104 | HR 72 | Temp 98.0°F | Resp 16 | Wt 193.0 lb

## 2015-02-01 VITALS — BP 134/74 | HR 83 | Resp 16 | Ht 68.0 in | Wt 193.0 lb

## 2015-02-01 DIAGNOSIS — I6521 Occlusion and stenosis of right carotid artery: Secondary | ICD-10-CM

## 2015-02-01 DIAGNOSIS — H532 Diplopia: Secondary | ICD-10-CM | POA: Diagnosis not present

## 2015-02-01 DIAGNOSIS — I10 Essential (primary) hypertension: Secondary | ICD-10-CM

## 2015-02-01 DIAGNOSIS — Z48812 Encounter for surgical aftercare following surgery on the circulatory system: Secondary | ICD-10-CM

## 2015-02-01 DIAGNOSIS — G459 Transient cerebral ischemic attack, unspecified: Secondary | ICD-10-CM

## 2015-02-01 NOTE — Patient Instructions (Signed)
Minimal Blood Pressure Goal= AVERAGE < 140/90;  Ideal is an AVERAGE < 135/85. This AVERAGE should be calculated from @ least 5-7 BP readings taken @ different times of day on different days of week. You should not respond to isolated BP readings , but rather the AVERAGE for that week .Please bring your  blood pressure cuff to office visits to verify that it is reliable.It  can also be checked against the blood pressure device at the pharmacy. Finger or wrist cuffs are not dependable; an arm cuff is.  Please restart the amlodipine and losartan. Please follow-up in 4-6 weeks with your blood pressure diary and blood pressure cuff.

## 2015-02-01 NOTE — Progress Notes (Signed)
Filed Vitals:   02/01/15 1413 02/01/15 1414  BP: 152/88 134/74  Pulse: 83 83  Resp: 16 16  Height: 5\' 8"  (1.727 m)   Weight: 193 lb (87.544 kg)

## 2015-02-01 NOTE — Progress Notes (Signed)
Subjective:     Patient ID: Martin Archer, male   DOB: 1961-02-03, 54 y.o.   MRN: [id number]  HPI this 54 year old male returns for initial follow-up regarding his right carotid endarterectomy performed by me on 01/19/2015 for a severe right ICA stenosis and a history of right brain TIAs. He has a remote history of a left carotid endarterectomy in 2008. He never returned for continuous follow-up and at some point in time silently occluded the left ICA without symptoms. He has had no recurrent right brain symptoms since his discharge from the hospital including lateralizing weakness, aphasia, neurosis fugax, diplopia, blurred vision, or syncope. He has noted some drooping of the right corner of the mouth due to known marginal mandibular nerve paresis which is slowly improving. He is taking daily aspirin.   Review of Systems     Objective:   Physical Exam BP 134/74 mmHg  Pulse 83  Resp 16  Ht 5\' 8"  (1.727 m)  Wt 193 lb (87.544 kg)  BMI 29.35 kg/m2  Gen. well-developed well-nourished male in no apparent distress alert and oriented 3 Right neck incision has healed nicely with no hematoma. 3+ carotid pulse with no audible bruits. Neurologic exam reveals mild right marginal mandibular nerve paresis-otherwise unremarkable     Assessment:     Status post right carotid endarterectomy performed on 01/19/2015 for severe right ICA stenosis with right brain TIAs-no recurrent TIAs since surgery    Plan:     Patient will return to work on 02/04/2015 Continue daily aspirin Return in 6 months for follow-up carotid duplex exam unless patient develops symptoms in the interim

## 2015-02-01 NOTE — Progress Notes (Signed)
Pre visit review using our clinic review tool, if applicable. No additional management support is needed unless otherwise documented below in the visit note. 

## 2015-02-01 NOTE — Progress Notes (Signed)
   Subjective:    Patient ID: Martin Archer, male    DOB: [birth year], 54 y.o.   MRN: [id number]  HPI He was hospitalized 5/21-5/26/16 with left arm and left leg weakness and left sided facial droop. Symptoms lasted approximately 30 minutes. He stated that he has had multiple similar episodes over the last several months lasting approximately 30 minutes. CT and MRI did not reveal an infarct.  Right carotid endarterectomy with patch angioplasty was completed 5/25 for 85% stenosis of the right internal carotid artery. Past history includes left CEA approximately 2008.  He was discharged on Plavix. He did have an episode last night lasting less than 1 minute of weakness in the right upper extremity. He has had intermittent slight diplopia. He's not had an Ophthalmologic exam for at least 10 years.  He has been out of his amlodipine and losartan for 2-3 days. He is not monitoring his blood pressure at home.  He has quit smoking. He has smoked from 1976-01/15/15 up to 2 packs per day. He does follow a modified heart healthy diet with decreased fried food, red meat, and sodium. He's been walking 2.5 miles per day without symptoms     Review of Systems Fever, chills, sweats, or unexplained weight loss not present. No significant headaches. Mental status change or memory loss denied. Blurred vision or vision loss absent. Vertigo, near syncope or imbalance denied. There is no numbness or tingling in extremities.   No loss of control of bladder or bowels. Radicular type pain absent. No seizure stigmata.  Chest pain, palpitations, tachycardia, exertional dyspnea, paroxysmal nocturnal dyspnea, claudication or edema are absent.  Epistaxis, hemoptysis, hematuria, melena, or rectal bleeding denied. No unexplained weight loss, significant dyspepsia,dysphagia, or abdominal pain.  There is no abnormal bruising , bleeding, or difficulty stopping bleeding with injury.     Objective:   Physical  Exam  Pertinent or positive findings include: His smile is asymmetric. He has a mustache and beard. The operative site over the right carotid is well-healed. There is no sign of cellulitis. He has a left carotid bruit. He has dullness to percussion in the upper quadrants   General appearance :adequately nourished; in no distress.  Eyes: No conjunctival inflammation or scleral icterus is present.  Oral exam:  Lips and gums are healthy appearing.There is no oropharyngeal erythema or exudate noted. Dental hygiene is good.  Heart:  Normal rate and regular rhythm. S1 and S2 normal without gallop, murmur, click, rub or other extra sounds    Lungs:Chest clear to auscultation; no wheezes, rhonchi,rales ,or rubs present.No increased work of breathing.   Abdomen: bowel sounds normal, soft and non-tender without masses, organomegaly or hernias noted.  No guarding or rebound.   Vascular : all pulses equal ; no bruits present.  Skin:Warm & dry.  Intact without suspicious lesions or rashes ; no tenting or jaundice   Lymphatic: No lymphadenopathy is noted about the head, neck, axilla  Neuro: Strength, tone & DTRs normal.         Assessment & Plan:  #1 status post carotid endarterectomy for advanced occlusive disease  #2 hypertension uncontrolled, noncompliance with antihypertensives regimen  #3 intermittent diplopia  Plan: See orders/recommendations

## 2015-02-02 NOTE — Addendum Note (Signed)
Addended by: Dorthula Rue L on: 02/02/2015 02:34 PM   Modules accepted: Orders

## 2015-02-08 ENCOUNTER — Telehealth: Payer: Self-pay | Admitting: Gastroenterology

## 2015-02-08 MED ORDER — PANTOPRAZOLE SODIUM 40 MG PO TBEC: 40.0000 mg | DELAYED_RELEASE_TABLET | Freq: Two times a day (BID) | ORAL | Status: DC

## 2015-02-08 NOTE — Telephone Encounter (Signed)
Patient reports he had 2 days of black stools and has had some reflux for several weeks.  He had a carotid endarterectomy in May and was started on Plavix.  He has a history of a gastric ulcer in the antrum last year.  He stopped his PPI last year.  He does report that today his stools were not black.  I sent in a refill of his pantoprazole for BID, he is advised to remain off all NSAIDS except for his 81 mg ASA, and come for follow up on 02/23/15 with Dr. Fuller Plan.  He is advised to call me back if he develops any new symptoms or his symptoms worsen until his office visit.

## 2015-02-10 ENCOUNTER — Telehealth: Payer: Self-pay

## 2015-02-10 ENCOUNTER — Telehealth: Payer: Self-pay | Admitting: Internal Medicine

## 2015-02-10 NOTE — Telephone Encounter (Signed)
Phone call from pt.  Reported he returned to work on 02/04/15.  Stated he has had increased fatigue and dizziness since he went back to work.  Stated he knows that he has been exerting a lot, and took the last 2 days off work to rest.  Reported, since Monday, he had an episode of feeling thick-tongued, and had numbness on the right side, involving his arm and leg.  Reported able to move his arm and leg freely at this time.  Also c/o (L) eye soreness, and blurry vision @ times.  Also, reported he has had new blood pressure medications added recently; stating "I'm on about 4 blood pressure medications now."  Also, reported he noticed dark stools, and contacted his GI doctor, and was told to get back on the Protonix.  Called his PCP today to report above symptoms, and told to call the surgeon's office.  Advised pt. that he would need to be medically evaluated, to determine the cause of above symptoms.  Encouraged to go to the ER tonight.  Stated "I feel okay right now, and I don't think this is life-threatening."  Stated "I think I'll talk it over with my sister, who is with me, and have her monitor everything, and if she thinks I need to go to the hospital, then I will go to the ER."

## 2015-02-10 NOTE — Telephone Encounter (Signed)
PLEASE NOTE: All timestamps contained within this report are represented as [place] Standard Time. CONFIDENTIALTY NOTICE: This fax transmission is intended only for the addressee. It contains information that is legally privileged, confidential or otherwise protected from use or disclosure. If you are not the intended recipient, you are strictly prohibited from reviewing, disclosing, copying using or disseminating any of this information or taking any action in reliance on or regarding this information. If you have received this fax in error, please notify us immediately by telephone so that we can arrange for its return to [place]. Phone: [phone], Toll-Free: [phone], Fax: [phone] Page: 1 of 1 Call Id: [id number] [place] [facility] - Client [place] Patient Name: Martin Archer DOB: 06-24-1961 Initial Comment Caller states he has been having blurred vision and tongue feeling thicker Nurse Assessment Nurse: Malva Cogan, RN, Juliann Pulse Date/Time (Eastern Time): 02/10/2015 3:00:11 PM Confirm and document reason for call. If symptomatic, describe symptoms. ---Caller states that he had carotid artery surgery 3 wks ago & had "some nerve damage" with that surgery, has been experiencing intermittent blurring of his vision & tongue has been feeling "thick" for past 2-3 days. Speech not slurred. Caller states that he has not tried to reach his surgeon about issue, advised that since he is under care of surgeon for 6 wks post-op that he needs to call office of his surgeon. Has the patient traveled out of the country within the last 30 days? ---No Does the patient require triage? ---No Please document clinical information provided and list any resource used. ---See previous note. Guidelines Guideline Title Affirmed Question Affirmed Notes Final Disposition User

## 2015-02-11 ENCOUNTER — Telehealth: Payer: Self-pay

## 2015-02-11 NOTE — Telephone Encounter (Signed)
If vision or speech issues persist or progress ; he needs to go to ER

## 2015-02-11 NOTE — Telephone Encounter (Signed)
Patient Name: Martin Archer DOB: 27-Sep-1960 Initial Comment Caller says she spoke with the trauma nurse, the surgeon's nurse yesterday because he had pain on left and a thick tongue; was having a hard time talking and paralysis on his right side. Has not been seen. No sx now, but wants to come in to be checked. He gets Sx every time he takes his med, and has not taken it. Nurse Assessment Nurse: Ronnald Ramp, RN, Miranda Date/Time (Eastern Time): 02/11/2015 11:03:46 AM Confirm and document reason for call. If symptomatic, describe symptoms. ---Caller states he has been having blurred vision and his tongue has felt thick off and on for the last 3-4 days. He called yesterday afternoon and spoke with a nurse who advised him to contact the vascular surgeon. He talked to them and was told that this was not related to the surgery. He is calling to get an appt. Caller disconnected the call when the nurse asked for his medical history. Tried calling back x 1, someone answered and immediately hung up the phone. Has the patient traveled out of the country within the last 30 days? ---Not Applicable Does the patient require triage? ---Declined Triage Guidelines Guideline Title Affirmed Question Affirmed Notes Final Disposition User Clinical Call Ronnald Ramp, RN, Jeannetta Nap

## 2015-02-11 NOTE — Telephone Encounter (Signed)
PLEASE NOTE: All timestamps contained within this report are represented as [place] Standard Time. CONFIDENTIALTY NOTICE: This fax transmission is intended only for the addressee. It contains information that is legally privileged, confidential or otherwise protected from use or disclosure. If you are not the intended recipient, you are strictly prohibited from reviewing, disclosing, copying using or disseminating any of this information or taking any action in reliance on or regarding this information. If you have received this fax in error, please notify us immediately by telephone so that we can arrange for its return to [place]. Phone: [phone], Toll-Free: [phone], Fax: [phone] Page: 1 of 1 Call Id: [id number] [place] [facility] - Client [place] Patient Name: Martin Archer Gender: Male DOB: 03-27-1961 Age: 54 Y 29 D Return Phone Number: [phone] (Primary) Address: City/State/Zip: [place] Client [place] [facility] - Client Client Site [place] [facility] [place], [place] Type Call Call Type Triage / Clinical Relationship To Patient Self Appointment Disposition EMR Appointment Not Necessary Info pasted into Epic Yes Return Phone Number [phone] (Primary) Chief Complaint Vision - (non urgent symptoms) Initial Comment Caller states he has been having blurred vision and tongue feeling thicker Nurse Assessment Nurse: Malva Cogan, RN, Juliann Pulse Date/Time (Eastern Time): 02/10/2015 3:00:11 PM Confirm and document reason for call. If symptomatic, describe symptoms. ---Caller states that he had carotid artery surgery 3 wks ago & had "some nerve damage" with that surgery, has been experiencing intermittent blurring of his vision & tongue has been feeling "thick" for past 2-3 days. Speech not slurred. Caller states that he has not tried to reach his surgeon about issue, advised that  since he is under care of surgeon for 6 wks post-op that he needs to call office of his surgeon. Has the patient traveled out of the country within the last 30 days? ---No Does the patient require triage? ---No Please document clinical information provided and list any resource used. ---See previous note. Guidelines Guideline Title Affirmed Question Affirmed Notes Nurse Date/Time (Eastern Time) Disp. Time Eilene Ghazi Time) Disposition Final User 02/10/2015 3:00:00 PM Clinical Call Yes Malva Cogan, RN, Juliann Pulse After Care Instructions Given Call Event Type User Date / Time Description

## 2015-02-12 ENCOUNTER — Emergency Department (HOSPITAL_COMMUNITY): Payer: 59

## 2015-02-12 ENCOUNTER — Encounter (HOSPITAL_COMMUNITY): Payer: Self-pay | Admitting: Nurse Practitioner

## 2015-02-12 ENCOUNTER — Emergency Department (HOSPITAL_COMMUNITY)
Admission: EM | Admit: 2015-02-12 | Discharge: 2015-02-12 | Disposition: A | Discharge status: Home / Self Care | Payer: 59 | Attending: Emergency Medicine | Admitting: Emergency Medicine

## 2015-02-12 DIAGNOSIS — Z7902 Long term (current) use of antithrombotics/antiplatelets: Secondary | ICD-10-CM | POA: Insufficient documentation

## 2015-02-12 DIAGNOSIS — E785 Hyperlipidemia, unspecified: Secondary | ICD-10-CM | POA: Insufficient documentation

## 2015-02-12 DIAGNOSIS — R200 Anesthesia of skin: Secondary | ICD-10-CM | POA: Diagnosis present

## 2015-02-12 DIAGNOSIS — K254 Chronic or unspecified gastric ulcer with hemorrhage: Secondary | ICD-10-CM | POA: Diagnosis not present

## 2015-02-12 DIAGNOSIS — D649 Anemia, unspecified: Secondary | ICD-10-CM | POA: Diagnosis not present

## 2015-02-12 DIAGNOSIS — Z79899 Other long term (current) drug therapy: Secondary | ICD-10-CM | POA: Diagnosis not present

## 2015-02-12 DIAGNOSIS — Z720 Tobacco use: Secondary | ICD-10-CM | POA: Diagnosis not present

## 2015-02-12 DIAGNOSIS — M1990 Unspecified osteoarthritis, unspecified site: Secondary | ICD-10-CM | POA: Insufficient documentation

## 2015-02-12 DIAGNOSIS — I10 Essential (primary) hypertension: Secondary | ICD-10-CM | POA: Insufficient documentation

## 2015-02-12 DIAGNOSIS — Z7982 Long term (current) use of aspirin: Secondary | ICD-10-CM | POA: Insufficient documentation

## 2015-02-12 DIAGNOSIS — D6489 Other specified anemias: Secondary | ICD-10-CM

## 2015-02-12 DIAGNOSIS — G4730 Sleep apnea, unspecified: Secondary | ICD-10-CM | POA: Insufficient documentation

## 2015-02-12 DIAGNOSIS — Z86010 Personal history of colonic polyps: Secondary | ICD-10-CM | POA: Insufficient documentation

## 2015-02-12 LAB — CBC
HCT: 26.4 % — ABNORMAL LOW (ref 39.0–52.0)
HEMOGLOBIN: 9 g/dL — AB (ref 13.0–17.0)
MCH: 31.5 pg (ref 26.0–34.0)
MCHC: 34.1 g/dL (ref 30.0–36.0)
MCV: 92.3 fL (ref 78.0–100.0)
Platelets: 260 10*3/uL (ref 150–400)
RBC: 2.86 MIL/uL — AB (ref 4.22–5.81)
RDW: 13 % (ref 11.5–15.5)
WBC: 10 10*3/uL (ref 4.0–10.5)

## 2015-02-12 LAB — BASIC METABOLIC PANEL
Anion gap: 8 (ref 5–15)
BUN: 22 mg/dL — ABNORMAL HIGH (ref 6–20)
CALCIUM: 9.1 mg/dL (ref 8.9–10.3)
CHLORIDE: 105 mmol/L (ref 101–111)
CO2: 25 mmol/L (ref 22–32)
Creatinine, Ser: 1.41 mg/dL — ABNORMAL HIGH (ref 0.61–1.24)
GFR calc Af Amer: >60 mL/min (ref >60)
GFR calc non Af Amer: 54 mL/min — ABNORMAL LOW (ref >60)
Glucose, Bld: 108 mg/dL — ABNORMAL HIGH (ref 65–99)
Potassium: 4.1 mmol/L (ref 3.5–5.1)
SODIUM: 138 mmol/L (ref 135–145)

## 2015-02-12 LAB — I-STAT TROPONIN, ED: Troponin i, poc: 0.01 ng/mL (ref 0.00–0.08)

## 2015-02-12 MED ORDER — FERROUS SULFATE 325 (65 FE) MG PO TABS: 325.0000 mg | ORAL_TABLET | Freq: Every day | ORAL | Status: DC

## 2015-02-12 NOTE — Telephone Encounter (Signed)
Pt contacted surgeon

## 2015-02-12 NOTE — ED Provider Notes (Signed)
Physical Exam  BP 183/99 mmHg  Pulse 89  Temp(Src) 97.7 F (36.5 C) (Oral)  Resp 17  Ht 5\' 7"  (1.702 m)  Wt 190 lb 4.8 oz (86.32 kg)  BMI 29.80 kg/m2  SpO2 100%  Physical Exam  ED Course  Procedures  MDM Care assumed at sign out. Patient recently admitted for stroke on ASA, plavix. Has R arm numbness today but unclear when it started. Sign out pending MRI. MRI showed no acute stroke. Of note, patient's Hg 9. Denies melena or rectal bleeding or vomiting or hematuria. Rectal exam showed no stool or blood. Occ neg. He is already on protonix. Will have him f/u with [facility] for follow up. Start iron pills for now.   Results for orders placed or performed during the hospital encounter of 02/12/15  CBC  Result Value Ref Range   WBC 10.0 4.0 - 10.5 K/uL   RBC 2.86 (L) 4.22 - 5.81 MIL/uL   Hemoglobin 9.0 (L) 13.0 - 17.0 g/dL   HCT 26.4 (L) 39.0 - 52.0 %   MCV 92.3 78.0 - 100.0 fL   MCH 31.5 26.0 - 34.0 pg   MCHC 34.1 30.0 - 36.0 g/dL   RDW 13.0 11.5 - 15.5 %   Platelets 260 150 - 400 K/uL  Basic metabolic panel  Result Value Ref Range   Sodium 138 135 - 145 mmol/L   Potassium 4.1 3.5 - 5.1 mmol/L   Chloride 105 101 - 111 mmol/L   CO2 25 22 - 32 mmol/L   Glucose, Bld 108 (H) 65 - 99 mg/dL   BUN 22 (H) 6 - 20 mg/dL   Creatinine, Ser 1.41 (H) 0.61 - 1.24 mg/dL   Calcium 9.1 8.9 - 10.3 mg/dL   GFR calc non Af Amer 54 (L) >60 mL/min   GFR calc Af Amer >60 >60 mL/min   Anion gap 8 5 - 15  I-stat troponin, ED  (not at [facility], [facility])  Result Value Ref Range   Troponin i, poc 0.01 0.00 - 0.08 ng/mL   Comment 3           Dg Chest 2 View  01/15/2015   CLINICAL DATA:  Patient with left body numbness and dizziness.  EXAM: CHEST  2 VIEW  COMPARISON:  Chest radiograph 12/04/2006  FINDINGS: Monitoring leads overlie the patient. Cardiac contours upper limits of normal. No consolidative pulmonary opacities. Possible 7 mm right lower lobe nodule. No pleural effusion or pneumothorax. Regional  skeleton is unremarkable.  IMPRESSION: No acute cardiopulmonary process.  Possible 7 mm right lower lobe nodule. Recommend correlation with chest CT in the non acute setting.   Electronically Signed   By: Lovey Newcomer M.D.   On: 01/15/2015 16:02   Ct Head Wo Contrast  01/15/2015   CLINICAL DATA:  Acute onset of left-sided weakness and numbness approximately 12 hr ago, subsequently resolved. Patient states that the symptoms of occurred intermittently over the past several months.  EXAM: CT HEAD WITHOUT CONTRAST  TECHNIQUE: Contiguous axial images were obtained from the base of the skull through the vertex without intravenous contrast.  COMPARISON:  11/18/2006.  FINDINGS: Ventricular system normal in size and appearance for age. No significant atrophy. Since the prior examination in 2008, the patient has developed low-attenuation within the pons and midbrain, and possibly also in the medulla. No evidence of generalized white matter changes. No new brain parenchymal abnormalities elsewhere. No acute hemorrhage or hematoma. No extra-axial fluid collections. No visible mass lesion. No  evidence of acute stroke.  No skull fracture or other focal osseous abnormality involving the skull. Visualized paranasal sinuses, bilateral mastoid air cells and bilateral middle ear cavities well-aerated. Mild bilateral carotid siphon atherosclerosis.  IMPRESSION: 1. Possible ischemic changes in the pons, midbrain, and medulla without associated hemorrhage. 2. Age advanced mild bilateral carotid siphon atherosclerosis. MRI of the brain without contrast may be helpful in further evaluation to confirm or deny the above findings.   Electronically Signed   By: Evangeline Dakin M.D.   On: 01/15/2015 08:13   Ct Angio Neck W/cm &/or Wo/cm  01/16/2015   CLINICAL DATA:  Right brain TIA  EXAM: CT ANGIOGRAPHY NECK  TECHNIQUE: Multidetector CT imaging of the neck was performed using the standard protocol during bolus administration of  intravenous contrast. Multiplanar CT image reconstructions and MIPs were obtained to evaluate the vascular anatomy. Carotid stenosis measurements (when applicable) are obtained utilizing NASCET criteria, using the distal internal carotid diameter as the denominator.  CONTRAST:  48mL OMNIPAQUE IOHEXOL 350 MG/ML SOLN  COMPARISON:  MRI/MRA head 01/15/2015  FINDINGS: Aortic arch: Mild atherosclerotic aortic arch. Lung apices are clear.  Right carotid system: Right common carotid artery is patent with mild atherosclerotic disease. There is calcified and noncalcified plaque in the carotid bulb. Critical stenosis measuring 85% diameter stenosis of the proximal right internal carotid artery . External carotid artery widely patent. Right internal carotid artery patent to the skullbase.  Left carotid system: Left common carotid artery is occluded at the aortic arch. Left internal carotid artery is occluded through the skullbase with intracranial reconstitution. See MRA report from yesterday.  Vertebral arteries:Right vertebral artery widely patent to the basilar without stenosis.  Left vertebral artery demonstrates a mild to moderate stenosis at the level the foramen magnum, less severe than that suggested on MRA. There is also mild stenosis of the distal left vertebral artery before the basilar.  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 This is nonocclusive. There is also filling defect in the right internal jugular vein felt to be thrombus, less likely non-opacified blood mixing with opacified blood.  Skeleton: No acute bony change.  Other neck: Negative for adenopathy in the neck.  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  85% diameter stenosis proximal right internal carotid artery  Right vertebral artery widely patent. Mild to moderate stenosis distal left vertebral artery.   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 This is nonocclusive. There is also probable nonocclusive thrombus in the right jugular vein.   Electronically Signed   By: Franchot Gallo M.D.   On: 01/16/2015 08:41   Ct Chest W Contrast  01/17/2015   CLINICAL DATA:  Right lower lobe pulmonary nodule. Long-term smoking history.  EXAM: CT CHEST, ABDOMEN, AND PELVIS WITH CONTRAST  TECHNIQUE: Multidetector CT imaging of the chest, abdomen and pelvis was performed following the standard protocol during bolus administration of intravenous contrast.  CONTRAST:  117mL OMNIPAQUE IOHEXOL 300 MG/ML  SOLN  COMPARISON:  02/10/2010 CT abdomen/pelvis, chest radiograph 01/15/2015, neck CTA 01/16/2015  FINDINGS: CT CHEST FINDINGS  Mediastinum/Nodes: Left common carotid arterial occlusion reidentified. Heart size is normal. No pericardial effusion. Mild atheromatous aortic and coronary arterial calcification. No lymphadenopathy.  Lungs/Pleura: No pulmonary parenchymal nodule, mass, or consolidation is identified. No specific poorly to the previously questioned finding, possibly previously representing nipple  shadow. No pleural effusion.  Upper abdomen: Please see dedicated report below.  Musculoskeletal: No acute osseous abnormality.  CT ABDOMEN AND PELVIS FINDINGS  Lower chest:  Please see dedicated report above.  Hepatobiliary: Liver and gallbladder appear normal.  Pancreas: Normal  Spleen: Normal  Adrenals/Urinary Tract: Adrenal glands are unremarkable. Left renal cortical scarring noted. Right kidney is unremarkable. No perinephric fluid or hydroureteronephrosis. No radiopaque renal, ureteral, or bladder calculus.  Stomach/Bowel: Normal-appearing appendix. No bowel wall thickening or focal segmental dilatation is identified. Colonic diverticuli noted without evidence for diverticulitis.  Vascular/Lymphatic: Moderate atheromatous aortic calcification without aneurysm. No  lymphadenopathy.  Other: No free air or fluid.  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 1.6 cm anterolisthesis L5 on S1. Remote appearing sclerotic L5 compression deformity posteriorly approximately 30 percent vertebral body height. No acute osseous abnormality.  IMPRESSION: No CT correlate to the previously questioned right lower lobe pulmonary parenchymal nodule, which may have represented nipple shadow or something overlying the patient.  No acute abnormality within the chest, abdomen, or pelvis.   Electronically Signed   By: Conchita Paris M.D.   On: 01/17/2015 12:55   [name]  02/12/2015   CLINICAL DATA:  Right carotid endarterectomy 01/19/2015. Right body numbness.  EXAM: MRI HEAD WITHOUT CONTRAST  TECHNIQUE: Multiplanar, multiecho pulse sequences of the brain and surrounding structures were obtained without intravenous contrast.  COMPARISON:  MRI 01/15/2015  FINDINGS: Negative for acute infarct.  No significant chronic ischemia  Ventricle size is normal.  Cerebral volume is normal.  Negative for intracranial hemorrhage.  Negative for mass or edema  Abnormal signal in the left cavernous carotid again noted due to chronic occlusion. Right cavernous carotid appears patent.  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   Electronically Signed   By: Franchot Gallo M.D.   On: 02/12/2015 16:25   [name]  01/15/2015   CLINICAL DATA:  Stroke.  Abnormal CT head  EXAM: MRI HEAD WITHOUT CONTRAST  MRA HEAD WITHOUT CONTRAST  TECHNIQUE: Multiplanar, multiecho pulse sequences of the brain and surrounding structures were obtained without intravenous contrast. Angiographic images of the head were obtained using MRA technique without contrast.  COMPARISON:  CT head 01/15/2015  FINDINGS: MRI HEAD FINDINGS  Negative for acute infarct. CT abnormalities in the brainstem are felt to be artifact on CT as this  area appears normal on MRI. This is compatible with beam hardening artifact on CT.  Ventricle size normal.  Cerebral volume normal.  Pituitary not enlarged. Craniocervical junction normal. Paranasal sinuses are clear. Mastoid sinuses are clear.  Negative for acute or chronic ischemia. Negative for demyelinating disease.  Hyperintense signal in the left internal carotid artery compatible with occlusion.  Negative for intracranial hemorrhage.  No mass or edema identified.  MRA HEAD FINDINGS  Decreased signal distal left vertebral artery which is likely due to atherosclerotic disease and moderate stenosis. Distal right vertebral artery patent to the basilar. AICA patent bilaterally. Basilar widely patent. Superior cerebellar and posterior cerebral arteries patent bilaterally.  Right cavernous carotid shows mild atherosclerotic disease without significant stenosis. 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 Left anterior and middle cerebral arteries are patent and supplied via the anterior communicating artery and a small left posterior communicating artery which is patent. Right posterior communicating artery also patent  Negative for cerebral aneurysm.  IMPRESSION: Negative for acute or chronic ischemic change. CT finding in the brainstem is an artifact.  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   Electronically Signed   By: Franchot Gallo M.D.   On: 01/15/2015 12:42   Ct Abdomen Pelvis W Contrast  01/17/2015   CLINICAL DATA:  Right lower lobe pulmonary nodule. Long-term smoking history.  EXAM: CT CHEST, ABDOMEN, AND PELVIS WITH CONTRAST  TECHNIQUE: Multidetector CT imaging of the chest, abdomen and pelvis was performed following the standard protocol during bolus administration of intravenous contrast.  CONTRAST:  143mL OMNIPAQUE IOHEXOL 300  MG/ML  SOLN  COMPARISON:  02/10/2010 CT abdomen/pelvis, chest radiograph 01/15/2015, neck CTA 01/16/2015  FINDINGS: CT CHEST FINDINGS  Mediastinum/Nodes: Left common carotid arterial occlusion reidentified. Heart size is normal. No pericardial effusion. Mild atheromatous aortic and coronary arterial calcification. No lymphadenopathy.  Lungs/Pleura: No pulmonary parenchymal nodule, mass, or consolidation is identified. No specific poorly to the previously questioned finding, possibly previously representing nipple shadow. No pleural effusion.  Upper abdomen: Please see dedicated report below.  Musculoskeletal: No acute osseous abnormality.  CT ABDOMEN AND PELVIS FINDINGS  Lower chest:  Please see dedicated report above.  Hepatobiliary: Liver and gallbladder appear normal.  Pancreas: Normal  Spleen: Normal  Adrenals/Urinary Tract: Adrenal glands are unremarkable. Left renal cortical scarring noted. Right kidney is unremarkable. No perinephric fluid or hydroureteronephrosis. No radiopaque renal, ureteral, or bladder calculus.  Stomach/Bowel: Normal-appearing appendix. No bowel wall thickening or focal segmental dilatation is identified. Colonic diverticuli noted without evidence for diverticulitis.  Vascular/Lymphatic: Moderate atheromatous aortic calcification without aneurysm. No lymphadenopathy.  Other: No free air or fluid.  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 1.6 cm anterolisthesis L5 on S1. Remote appearing sclerotic L5 compression deformity posteriorly approximately 30 percent vertebral body height. No acute osseous abnormality.  IMPRESSION: No CT correlate to the previously questioned right lower lobe pulmonary parenchymal nodule, which may have represented nipple shadow or something overlying the patient.  No acute abnormality within the chest, abdomen, or pelvis.   Electronically Signed   By: Conchita Paris M.D.   On: 01/17/2015 12:55   Mr Jodene Nam Head/brain Wo  Cm  01/15/2015   CLINICAL DATA:  Stroke.  Abnormal CT head  EXAM: MRI HEAD WITHOUT CONTRAST  MRA HEAD WITHOUT CONTRAST  TECHNIQUE: Multiplanar, multiecho pulse sequences of the brain and surrounding structures were obtained without intravenous contrast. Angiographic images of the head were obtained using MRA technique without contrast.  COMPARISON:  CT head 01/15/2015  FINDINGS: MRI HEAD FINDINGS  Negative for acute infarct. CT abnormalities in the brainstem are felt to be artifact on CT as this area appears normal on MRI. This is compatible with beam hardening artifact on CT.  Ventricle size normal.  Cerebral volume normal.  Pituitary not enlarged. Craniocervical junction normal. Paranasal sinuses are clear. Mastoid sinuses are clear.  Negative for acute or chronic ischemia. Negative for demyelinating disease.  Hyperintense signal in the left internal carotid artery compatible with occlusion.  Negative for intracranial hemorrhage.  No mass or edema identified.  MRA HEAD FINDINGS  Decreased signal distal left vertebral artery which is likely due to atherosclerotic disease and moderate stenosis. Distal right vertebral artery patent to the basilar. AICA patent bilaterally. Basilar widely patent. Superior cerebellar and posterior cerebral arteries patent bilaterally.  Right cavernous carotid shows mild atherosclerotic disease without significant stenosis. 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 Left anterior and middle cerebral arteries are patent and supplied via the anterior communicating artery and a small left posterior communicating artery which is patent. Right posterior communicating artery also patent  Negative for cerebral aneurysm.  IMPRESSION: Negative for acute or chronic ischemic change. CT finding in the brainstem is an artifact.  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   Electronically Signed   By: Franchot Gallo M.D.   On: 01/15/2015 12:42      Wandra Arthurs, MD 02/12/15 [phone]

## 2015-02-12 NOTE — ED Notes (Signed)
He had R carotid endarterectomy on 5/25 here, he noticed numbness from R side of neck radiating down the entire R side of body starting after the procedure. He has bruising and redness to abdomen, he is unsure why and also noticed some GI bleeding recently which seemed to resolve after his GI MD started him on protonix. He is A&Ox4, breathing easily, MAE.

## 2015-02-12 NOTE — ED Provider Notes (Signed)
CSN: [id number]     Arrival date & time 02/12/15  1147 History   First MD Initiated Contact with Patient 02/12/15 1218     Chief Complaint  Patient presents with  . Numbness     HPI  Patient presents for evaluation of right upper and right LE numbness. Ultimately was able to determine that this has been intermittent and present since before her most recent admission. Had a recent admission for stroke symptoms. Found to have complete occlusion of his left carotid for he had undergone carotid endarterectomy 10 years ago had right carotid endarterectomy on 525. He presents today stating he continues to have intermittent numbness of his right arm and leg and states "I just wanted this is going to be permanent or fevers anything you can do about it".  He returned to work 8 days ago after being hospitalized in the symptoms and return upon starting back to work. States he does not have vision changes but his left eye is blurry "only when I'm in the sunlight"    Past Medical History  Diagnosis Date  . Gastric ulcer with hemorrhage and obstruction 2011    Dr [facility]  . Anemia, unspecified   . Personal history of colonic polyps 01/30/2010    hyperplastic rectal  . Diverticulosis of colon (without mention of hemorrhage)   . Sleep apnea     on CPAP; [facility]  . Hypertension   . Hyperlipemia   . Arthritis    Past Surgical History  Procedure Laterality Date  . Inguinal hernia repair  1963  . Wisdom tooth extraction    . Nasal fracture surgery    . Carotid endarterectomy  11/2006    left Dr Mamie Nick  . Esophagogastroduodenoscopy Left 06/17/2013    Procedure: ESOPHAGOGASTRODUODENOSCOPY (EGD);  Surgeon: Arta Silence, MD;  Location[facility] ENDOSCOPY;  Service: Endoscopy;  Laterality: Left;  . Endarterectomy Right 01/19/2015    Procedure: RIGHT CAROTID ENDARTERECTOMY WITH PATCH ANGIOPLASTY;  Surgeon: Mal Misty, MD;  Location: [facility];  Service: Vascular;  Laterality:  Right;  . Tee without cardioversion N/A 01/17/2015    Procedure: TRANSESOPHAGEAL ECHOCARDIOGRAM (TEE);  Surgeon: Sanda Klein, MD;  Location: [facility] ENDOSCOPY;  Service: Cardiovascular;  Laterality: N/A;   Family History  Problem Relation Age of Onset  . Diabetes Father   . Hypertension Father   . Stroke Father     in late 61s  . Heart attack Mother     in 39s  . Heart attack Brother 74  . Diabetes Paternal Grandfather   . Cancer Maternal Grandfather     unknown type  . Heart attack Maternal Grandfather 76  . Colon cancer Neg Hx   . Esophageal cancer Neg Hx   . Stomach cancer Neg Hx   . Stroke Paternal Uncle     in late 28s   History  Substance Use Topics  . Smoking status: Current Every Day Smoker -- 1.00 packs/day    Types: Cigarettes  . Smokeless tobacco: Never Used     Comment: smoked 1976-2007 & 2009- present , up to 2 ppd, average > 1ppd  . Alcohol Use: No     Comment: currently at [facility] for treatment.     Review of Systems  Constitutional: Negative for fever, chills, diaphoresis, appetite change and fatigue.  HENT: Negative for mouth sores, sore throat and trouble swallowing.   Eyes: Negative for visual disturbance.  Respiratory: Negative for cough, chest tightness, shortness of breath  and wheezing.   Cardiovascular: Negative for chest pain.  Gastrointestinal: Negative for nausea, vomiting, abdominal pain, diarrhea and abdominal distention.  Endocrine: Negative for polydipsia, polyphagia and polyuria.  Genitourinary: Negative for dysuria, frequency and hematuria.  Musculoskeletal: Negative for gait problem.  Skin: Negative for color change, pallor and rash.  Neurological: Positive for numbness. Negative for dizziness, syncope, light-headedness and headaches.  Hematological: Does not bruise/bleed easily.  Psychiatric/Behavioral: Negative for behavioral problems and confusion.      Allergies  Adderall; Prozac; and Sertraline hcl  Home Medications    Prior to Admission medications   Medication Sig Start Date End Date Taking? Authorizing Provider  amLODipine (NORVASC) 5 MG tablet Take 1 tablet (5 mg total) by mouth daily. 12/08/14  Yes Hendricks Limes, MD  aspirin EC 81 MG tablet Take 1 tablet (81 mg total) by mouth daily. 01/20/15  Yes Ulyses Amor, PA-C  atorvastatin (LIPITOR) 80 MG tablet Take 1 tablet (80 mg total) by mouth daily. 01/20/15  Yes Ulyses Amor, PA-C  clopidogrel (PLAVIX) 75 MG tablet Take 1 tablet (75 mg total) by mouth daily. 01/20/15  Yes Ulyses Amor, PA-C  HYDROcodone-acetaminophen (NORCO/VICODIN) 5-325 MG per tablet Take 1 tablet by mouth every 6 (six) hours as needed for moderate pain. 01/20/15  Yes Ulyses Amor, PA-C  losartan (COZAAR) 100 MG tablet Take 1 tablet (100 mg total) by mouth daily. 12/08/14  Yes Hendricks Limes, MD  metoprolol tartrate (LOPRESSOR) 25 MG tablet Take 1 tablet (25 mg total) by mouth 2 (two) times daily. 12/08/14  Yes Hendricks Limes, MD  Multiple Vitamins-Minerals (MULTIVITAL PO) Take 1 tablet by mouth daily.    Yes Historical Provider, MD  pantoprazole (PROTONIX) 40 MG tablet Take 1 tablet (40 mg total) by mouth 2 (two) times daily. 02/08/15  Yes Ladene Artist, MD  HYDROcodone-acetaminophen (NORCO/VICODIN) 5-325 MG per tablet Take 1 tablet by mouth every 6 (six) hours as needed for moderate pain.  01/07/15   Historical Provider, MD   BP 173/93 mmHg  Pulse 84  Temp(Src) 97.7 F (36.5 C) (Oral)  Resp 16  Ht 5\' 7"  (1.702 m)  Wt 190 lb 4.8 oz (86.32 kg)  BMI 29.80 kg/m2  SpO2 100% Physical Exam  Constitutional: He is oriented to person, place, and time. He appears well-developed and well-nourished. No distress.  HENT:  Head: Normocephalic.  Eyes: Conjunctivae are normal. Pupils are equal, round, and reactive to light. No scleral icterus.  Neck: Normal range of motion. Neck supple. No thyromegaly present.  Cardiovascular: Normal rate and regular rhythm.  Exam reveals no gallop and  no friction rub.   No murmur heard. Pulmonary/Chest: Effort normal and breath sounds normal. No respiratory distress. He has no wheezes. He has no rales.  Abdominal: Soft. Bowel sounds are normal. He exhibits no distension. There is no tenderness. There is no rebound.  Musculoskeletal: Normal range of motion.  Neurological: He is alert and oriented to person, place, and time.  Normal symmetric cranial nerves. He reports feeling of pins and needles in the right arm and leg. Normal exam no pronator drift normal strength no leg drift. He reports normal light touch.  Skin: Skin is warm and dry. No rash noted.  Psychiatric: He has a normal mood and affect. His behavior is normal.    ED Course  Procedures (including critical care time) Labs Review Labs Reviewed  CBC - Abnormal; Notable for the following:    RBC 2.86 (*)  Hemoglobin 9.0 (*)    HCT 26.4 (*)    All other components within normal limits  BASIC METABOLIC PANEL - Abnormal; Notable for the following:    Glucose, Bld 108 (*)    BUN 22 (*)    Creatinine, Ser 1.41 (*)    GFR calc non Af Amer 54 (*)    All other components within normal limits  I-STAT TROPOININ, ED    Imaging Review No results found.   EKG Interpretation   Date/Time:  Saturday February 12 2015 11:58:12 EDT Ventricular Rate:  112 PR Interval:  122 QRS Duration: 96 QT Interval:  336 QTC Calculation: 458 R Axis:   41 Text Interpretation:  Sinus tachycardia Possible Left atrial enlargement  Nonspecific ST and T wave abnormality Abnormal ECG Confirmed by Jeneen Rinks  MD,  [place] ([id number]) on 02/12/2015 11:59:18 AM      MDM   Final diagnoses:  Numbness    Patient with a normal exam still mostly with intermittent symptoms. His MRI shows no change versus most recent comparison fill his appropriate for outpatient treatment to continue on his aspirin and Plavix and primary care follow-up.    Tanna Furry, MD 02/12/15 [phone]

## 2015-02-12 NOTE — ED Notes (Signed)
Pt hooked back up to the monitor with 5 lead, BP cuff and pulse ox

## 2015-02-12 NOTE — Discharge Instructions (Signed)
Continue protonix.   See your GI doctor.   Take iron pills daily.   Recheck hemoglobin with your doctor.   Return to ER if you have worse numbness, weakness, black stools, blood in stool.

## 2015-02-14 LAB — POC OCCULT BLOOD, ED: Fecal Occult Bld: NEGATIVE

## 2015-02-15 ENCOUNTER — Telehealth: Payer: Self-pay | Admitting: *Deleted

## 2015-02-15 NOTE — Telephone Encounter (Signed)
[facility] - Client [facility] Patient Name: Martin Archer Gender: Male DOB: [birth year] Age: 54 Y 30 D Return Phone Number: [phone] (Primary) Address: [address] City/State/Zip: [place] [address] Client [facility] - Client Client Site [facility] Day Physician Unice Cobble Contact Type Call Call Type Triage / Clinical Relationship To Patient Self Return Phone Number [phone] (Primary) Chief Complaint Health information question (non symptomatic) Initial Comment Caller says she spoke with the trauma nurse, the surgeon's nurse yesterday because he had pain on left and a thick tongue; was having a hard time talking and paralysis on his right side. Has not been seen. No sx now, but wants to come in to be checked. He gets Sx every time he takes his med, and has not taken it. Nurse Assessment Nurse: Ronnald Ramp, RN, Miranda Date/Time (Eastern Time): 02/11/2015 11:03:46 AM Confirm and document reason for call. If symptomatic, describe symptoms. ---Caller states he has been having blurred vision and his tongue has felt thick off and on for the last 3-4 days. He called yesterday afternoon and spoke with a nurse who advised him to contact the vascular surgeon. He talked to them and was told that this was not related to the surgery. He is calling to get an appt. Caller disconnected the call when the nurse asked for his medical history. Tried calling back x 1, someone answered and immediately hung up the phone. Has the patient traveled out of the country within the last 30 days? ---Not Applicable Does the patient require triage? ---Declined Triage Guidelines Guideline Title Affirmed Question Affirmed Notes Nurse Date/Time ([place] Time) Disp. Time Eilene Ghazi Time) Disposition Final User 02/11/2015 11:08:53 AM Clinical Call Yes Ronnald Ramp, RN, Miranda After Care Instructions  Given Call Event Type User Date / Time Description

## 2015-02-18 ENCOUNTER — Encounter: Payer: Self-pay | Admitting: Internal Medicine

## 2015-02-18 ENCOUNTER — Ambulatory Visit: Payer: 59 | Admitting: Internal Medicine

## 2015-02-18 ENCOUNTER — Ambulatory Visit (INDEPENDENT_AMBULATORY_CARE_PROVIDER_SITE_OTHER): Payer: 59 | Admitting: Internal Medicine

## 2015-02-18 VITALS — BP 132/84 | HR 82 | Temp 97.9°F | Ht 67.0 in | Wt 190.0 lb

## 2015-02-18 DIAGNOSIS — G451 Carotid artery syndrome (hemispheric): Secondary | ICD-10-CM | POA: Diagnosis not present

## 2015-02-18 DIAGNOSIS — Z8719 Personal history of other diseases of the digestive system: Secondary | ICD-10-CM | POA: Diagnosis not present

## 2015-02-18 DIAGNOSIS — D509 Iron deficiency anemia, unspecified: Secondary | ICD-10-CM | POA: Diagnosis not present

## 2015-02-18 DIAGNOSIS — Z8711 Personal history of peptic ulcer disease: Secondary | ICD-10-CM

## 2015-02-18 NOTE — Progress Notes (Signed)
Pre visit review using our clinic review tool, if applicable. No additional management support is needed unless otherwise documented below in the visit note. 

## 2015-02-18 NOTE — Patient Instructions (Addendum)
   I will contact your Neurologist advising him of your recurrent symptoms.Hold the Plavix; please take 325 mg of aspirin daily after a full breakfast.  Minimal Blood Pressure Goal= AVERAGE < 140/90;  Ideal is an AVERAGE < 135/85. This AVERAGE should be calculated from @ least 5-7 BP readings taken @ different times of day on different days of week. You should not respond to isolated BP readings , but rather the AVERAGE for that week .Please bring your  blood pressure cuff to office visits to verify that it is reliable.It  can also be checked against the blood pressure device at the pharmacy. Finger or wrist cuffs are not dependable; an arm cuff is.

## 2015-02-18 NOTE — Progress Notes (Signed)
Subjective:    Patient ID: Martin Archer, male    DOB: 08-23-1961, 54 y.o.   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 He denies any neurocardiac prodrome prior to the symptoms. He was found to be anemic with hemoglobin of 9 and hematocrit 26.4. He was placed on iron;he now states he actually had dark stools prior to starting the iron. FOB negative in ER; he is on a PPI. He denies any other bleeding dyscrasias.  He's describing intermittent paralysis of the right upper and right lower extremity which can last seconds to minutes, typically less than 60 minutes. He states that he can go for up to 24 hours without such an episode. 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 He had right carotid endarterectomy 01/19/15.  Because of these symptoms he stopped his blood pressure pills 1 day but felt there was no change in symptoms. He stopped his Plavix on 2 separate days and stated that he did not have the intermittent paralysis symptoms.  He remains a nonsmoker.  Past history includes gastric ulcer with hemorrhage and obstruction in 2011. He also has a history of diverticulosis but without associated hemorrhage     Review of Systems Fever, chills, sweats, or unexplained weight loss not present. No significant headaches. Mental status change or memory loss denied. Blurred vision , diplopia or vision loss absent. Vertigo, near syncope or imbalance denied.   No loss of control of bladder or bowels. Radicular type pain absent. No seizure stigmata.Epistaxis, hemoptysis, hematuria,  or rectal bleeding denied.No dysphagia or abdominal pain reported. No persistently small caliber stools  There has been  no abnormal bruising or bleeding. There is no difficulty stopping bleeding with injury.    Objective:   Physical Exam  Pertinent or positive findings  include: Cranial nerve testing reveals an asymmetric smile. There is some weakness to opposition in the right lower extremity. He has a faint bruit bilaterally over the carotids greater on the left than the right. The left knee reflex is 1+ ;the right is 0.75 +. He has horizontal  linear ecchymoses over the mid upper abdomen   General appearance :adequately nourished; in no distress.  Eyes: No conjunctival inflammation or scleral icterus is present.  Oral exam:  Lips and gums are healthy appearing.There is no oropharyngeal erythema or exudate noted. Dental hygiene is good.  Heart:  Normal rate and regular rhythm. S1 and S2 normal without gallop, murmur, click, rub or other extra sounds    Lungs:Chest clear to auscultation; no wheezes, rhonchi,rales ,or rubs present.No increased work of breathing.   Abdomen: bowel sounds normal, soft and non-tender without masses, organomegaly or hernias noted.  No guarding or rebound.   Vascular : all pulses equal .  Skin:Warm & dry.  Intact without suspicious lesions or rashes ; no tenting or jaundice   Lymphatic: No lymphadenopathy is noted about the head, neck, axilla  Neuro: Strength, tone normal.He is a very poor historian; responses often do not pertain to the question and answers are often vague such "not long".         Assessment & Plan:  #1 intermittent "paralysis" of the right upper and right lower extremity. Subjectively he was better off the Plavix.  #2 anemia   #3 melena  Plan: He'll be asked to hold the Plavix for the next 48 hours and  then return for CBC and differential with iron binding panel and ferritin. During that period time he is to take a coated 325 mg aspirin after a full breakfast.  Continuing the Plavix long-term will be discussed with Dr.Xu. If he has progressive anemia GI reevaluation would be necessary prior to continuing the Plavix

## 2015-02-21 ENCOUNTER — Other Ambulatory Visit (INDEPENDENT_AMBULATORY_CARE_PROVIDER_SITE_OTHER): Payer: 59

## 2015-02-21 DIAGNOSIS — D509 Iron deficiency anemia, unspecified: Secondary | ICD-10-CM

## 2015-02-21 LAB — CBC WITH DIFFERENTIAL/PLATELET
BASOS ABS: 0 10*3/uL (ref 0.0–0.1)
Basophils Relative: 0.4 % (ref 0.0–3.0)
Eosinophils Absolute: 0 10*3/uL (ref 0.0–0.7)
Eosinophils Relative: 0.1 % (ref 0.0–5.0)
HCT: 27.9 % — ABNORMAL LOW (ref 39.0–52.0)
Hemoglobin: 9.4 g/dL — ABNORMAL LOW (ref 13.0–17.0)
LYMPHS PCT: 20 % (ref 12.0–46.0)
Lymphs Abs: 2 10*3/uL (ref 0.7–4.0)
MCHC: 33.9 g/dL (ref 30.0–36.0)
MCV: 92.9 fl (ref 78.0–100.0)
Monocytes Absolute: 1 10*3/uL (ref 0.1–1.0)
Monocytes Relative: 9.6 % (ref 3.0–12.0)
NEUTROS PCT: 69.9 % (ref 43.0–77.0)
Neutro Abs: 7.1 10*3/uL (ref 1.4–7.7)
Platelets: 288 10*3/uL (ref 150.0–400.0)
RBC: 3 Mil/uL — AB (ref 4.22–5.81)
RDW: 13.5 % (ref 11.5–15.5)
WBC: 10.2 10*3/uL (ref 4.0–10.5)

## 2015-02-21 LAB — IBC PANEL
IRON: 286 ug/dL — AB (ref 42–165)
Saturation Ratios: 92 % — ABNORMAL HIGH (ref 20.0–50.0)
Transferrin: 222 mg/dL (ref 212.0–360.0)

## 2015-02-21 LAB — FERRITIN: FERRITIN: 30.4 ng/mL (ref 22.0–322.0)

## 2015-02-23 ENCOUNTER — Ambulatory Visit: Payer: 59 | Admitting: Gastroenterology

## 2015-03-10 ENCOUNTER — Encounter: Payer: Self-pay | Admitting: Internal Medicine

## 2015-03-10 ENCOUNTER — Other Ambulatory Visit (INDEPENDENT_AMBULATORY_CARE_PROVIDER_SITE_OTHER): Payer: 59

## 2015-03-10 ENCOUNTER — Ambulatory Visit (INDEPENDENT_AMBULATORY_CARE_PROVIDER_SITE_OTHER): Payer: 59 | Admitting: Internal Medicine

## 2015-03-10 VITALS — BP 162/110 | HR 85 | Temp 98.6°F | Resp 14 | Wt 189.0 lb

## 2015-03-10 DIAGNOSIS — I10 Essential (primary) hypertension: Secondary | ICD-10-CM | POA: Diagnosis not present

## 2015-03-10 DIAGNOSIS — D500 Iron deficiency anemia secondary to blood loss (chronic): Secondary | ICD-10-CM | POA: Diagnosis not present

## 2015-03-10 LAB — CBC WITH DIFFERENTIAL/PLATELET
Basophils Absolute: 0 10*3/uL (ref 0.0–0.1)
Basophils Relative: 0.4 % (ref 0.0–3.0)
Eosinophils Absolute: 0 10*3/uL (ref 0.0–0.7)
Eosinophils Relative: 0 % (ref 0.0–5.0)
HCT: 35.2 % — ABNORMAL LOW (ref 39.0–52.0)
Hemoglobin: 11.7 g/dL — ABNORMAL LOW (ref 13.0–17.0)
LYMPHS PCT: 20.7 % (ref 12.0–46.0)
Lymphs Abs: 1.9 10*3/uL (ref 0.7–4.0)
MCHC: 33.4 g/dL (ref 30.0–36.0)
MCV: 92.3 fl (ref 78.0–100.0)
MONOS PCT: 8.9 % (ref 3.0–12.0)
Monocytes Absolute: 0.8 10*3/uL (ref 0.1–1.0)
NEUTROS ABS: 6.3 10*3/uL (ref 1.4–7.7)
NEUTROS PCT: 70 % (ref 43.0–77.0)
Platelets: 301 10*3/uL (ref 150.0–400.0)
RBC: 3.81 Mil/uL — AB (ref 4.22–5.81)
RDW: 14.5 % (ref 11.5–15.5)
WBC: 9 10*3/uL (ref 4.0–10.5)

## 2015-03-10 MED ORDER — AMLODIPINE BESYLATE 10 MG PO TABS: 10.0000 mg | ORAL_TABLET | Freq: Every day | ORAL | Status: DC

## 2015-03-10 NOTE — Patient Instructions (Signed)
Minimal Blood Pressure Goal= AVERAGE < 140/90;  Ideal is an AVERAGE < 135/85. This AVERAGE should be calculated from @ least 5-7 BP readings taken @ different times of day on different days of week. You should not respond to isolated BP readings , but rather the AVERAGE for that week .Please bring your  blood pressure cuff to office visits to verify that it is reliable.It  can also be checked against the blood pressure device at the pharmacy. Finger or wrist cuffs are not dependable; an arm cuff is.  Please increase the amlodipine to 10 mg daily. If your blood pressure continues to be above 140/90 on average after 7-10 days of this dose; increase the metoprolol to 25 mg 2 pills twice a day.

## 2015-03-10 NOTE — Progress Notes (Signed)
Pre visit review using our clinic review tool, if applicable. No additional management support is needed unless otherwise documented below in the visit note. 

## 2015-03-10 NOTE — Progress Notes (Signed)
   Subjective:    Patient ID: Martin Archer, male    DOB: 05/19/1961, 54 y.o.   MRN: [id number]  HPI  He has been compliant with his medicines rarely missing the second dose of metoprolol. His blood pressures still are in the 160s over 90s at home on average.  He feels better off the Plavix and has had no further numbness and paralysis symptoms. He remains a nonsmoker.  He is on an iron supplement with the expected changes in stool color. He has no active GI symptoms. His most recent hemoglobin and hematocrit were 9.4 and 27.9 on 6/27. He is questioning discontinuing the iron. He has some "faintness and fatigue" when out in the heat. He has lost 10 pounds purposefully.  He is eating increased fruits and vegetables and reducing "junk food" eats red meat occasionally. He is walking every other day 30-60 minutes without symptoms.  Review of Systems He has some headaches in the morning which goes away after he eats or takes Excedrin. These are improving.   Chest pain, palpitations, tachycardia, exertional dyspnea, paroxysmal nocturnal dyspnea, claudication or edema are absent.       Objective:   Physical Exam  Appears healthy and well-nourished & in no acute distress  No carotid bruits are present.No neck vein distention present at 10 - 15 degrees. Thyroid normal to palpation  Heart rhythm and rate are normal with no gallop or murmur  Chest is clear with no increased work of breathing  There is no evidence of aortic aneurysm or renal artery bruits. He has brisk left carotid bruit.  Abdomen soft with no organomegaly or masses. No HJR  No clubbing, cyanosis or edema present.  Pedal pulses are intact   No ischemic skin changes are present . Fingernails healthy   Alert and oriented. Strength, tone, DTRs reflexes normal        Assessment & Plan:  #1 hypertension uncontrolled; see orders after visit summary  #2 anemia with supernormal iron level. Discontinue iron if  the hemoglobin and hematocrit are improved significantly.

## 2015-03-11 ENCOUNTER — Telehealth: Payer: Self-pay | Admitting: Internal Medicine

## 2015-03-11 NOTE — Telephone Encounter (Signed)
Patient was in yesterday and seen his iron is low.  He has been taking ferrous sulfate 325 (65 FE) MG tablet [id number] that was prescribed to him while in the hospital. He is almost out and would like a refill Pharmacy is [facility] on [address]

## 2015-03-14 NOTE — Telephone Encounter (Signed)
Informed pt to DC iron medication

## 2015-03-14 NOTE — Telephone Encounter (Signed)
Okay to give a refill?

## 2015-03-14 NOTE — Telephone Encounter (Signed)
Iron level was normal; iron can be D/Ced

## 2015-03-15 ENCOUNTER — Encounter: Payer: Self-pay | Admitting: Internal Medicine

## 2015-03-25 ENCOUNTER — Ambulatory Visit (INDEPENDENT_AMBULATORY_CARE_PROVIDER_SITE_OTHER): Payer: 59 | Admitting: Neurology

## 2015-03-25 ENCOUNTER — Encounter: Payer: Self-pay | Admitting: Neurology

## 2015-03-25 VITALS — BP 160/90 | HR 77 | Ht 68.0 in | Wt 192.8 lb

## 2015-03-25 DIAGNOSIS — I639 Cerebral infarction, unspecified: Secondary | ICD-10-CM | POA: Diagnosis not present

## 2015-03-25 DIAGNOSIS — I63239 Cerebral infarction due to unspecified occlusion or stenosis of unspecified carotid arteries: Secondary | ICD-10-CM

## 2015-03-25 DIAGNOSIS — Z720 Tobacco use: Secondary | ICD-10-CM | POA: Diagnosis not present

## 2015-03-25 DIAGNOSIS — Z9889 Other specified postprocedural states: Secondary | ICD-10-CM

## 2015-03-25 DIAGNOSIS — E785 Hyperlipidemia, unspecified: Secondary | ICD-10-CM

## 2015-03-25 DIAGNOSIS — G451 Carotid artery syndrome (hemispheric): Secondary | ICD-10-CM | POA: Diagnosis not present

## 2015-03-25 DIAGNOSIS — I6522 Occlusion and stenosis of left carotid artery: Secondary | ICD-10-CM

## 2015-03-25 DIAGNOSIS — F17200 Nicotine dependence, unspecified, uncomplicated: Secondary | ICD-10-CM

## 2015-03-25 NOTE — Patient Instructions (Signed)
-   continue ASA and lipitor for stroke prevention - continue protonix for GI ulcer - your BP still high, increase metoprolol to 1.5 tablets twice a day. Continue norvasc and cozaar the same dose - check BP at home and record and bring over to Dr. Linna Darner for BP meds adjustment - due to your right side weakness episodes, will need to do further testing include EEG, CTA head and neck - will do blood draw today - Follow up with your primary care physician for stroke risk factor modification. Recommend maintain blood pressure goal <130/80, diabetes with hemoglobin A1c goal below 6.5% and lipids with LDL cholesterol goal below 70 mg/dL.  - follow up with GI and vascular surgery - follow up in 6 weeks.

## 2015-03-25 NOTE — Progress Notes (Signed)
STROKE NEUROLOGY FOLLOW UP NOTE  NAME: Martin Archer DOB: 03-05-1961  REASON FOR VISIT: stroke follow up HISTORY FROM: pt and chart  Today we had the pleasure of seeing Martin Archer in follow-up at our Neurology Clinic. Pt was accompanied by no one.   History Summary Martin Archer is a 54 y.o. male with history of HTN, current smoker, HLD, s/p left CEA, OSA, gastric ulcer with hemorrhage and obstruction, and diverticulosis admitted to [facility] on 01/20/15 for multiple transient episodes of left-sided numbness and weakness as well as a mild headache, facial droop, and blurred vision. MRI no infarct but MRA showed left ICA occlusion and CUS showed right ICA > 80% stenosis. Had CTA showed left CCA occlusion, right ICA 85% stenosis and bilateral jugular vein thrombus. TEE showed severe athero of aortic arch, also has large and highly mobile component. He was put on heparin drip. Vascular surgery consulted and he had right CEA done during hospitalization. His heparin was taken off before surgery and did not resume after surgery but discharged with dural antiplatelet to home.   Interval History During the interval time, the patient has been doing relatively stable. After discharge, he followed up with PCP, feeling good. However, he developed black stool and followed up with GI and put on protonix. He went to ED on 02/12/15 due to recurrent right sided numbness and weakness lasting 15-20 min each, similar to his admission above. MRI showed no acute infarct, but he was found to have Hb 9.0. He was supplied with iron. Pt followed up with PCP and black stool resolved. His plavix was discontinued, and now on ASA 325mg  only. His repeat Hb two week ago was 11.7. BP today 160/90, he stated that his BP at home always from 150-160. He is on 3 BP meds. He has quit smoking.   REVIEW OF SYSTEMS: Full 14 system review of systems performed and notable only for those listed below and in HPI above,  all others are negative:  Constitutional:   Cardiovascular:  Ear/Nose/Throat:   Skin:  Eyes:   Respiratory:   Gastroitestinal:   Genitourinary:  Hematology/Lymphatic:   Endocrine:  Musculoskeletal:   Allergy/Immunology:   Neurological:   Psychiatric:  Sleep:   The following represents the patient's updated allergies and side effects list: Allergies  Allergen Reactions  . Adderall [Amphetamine-Dextroamphet Er]     diarrhea  . Prozac [Fluoxetine Hcl]     diarrhea  . Sertraline Hcl     REACTION: diarrhea    The neurologically relevant items on the patient's problem list were reviewed on today's visit.  Neurologic Examination  A problem focused neurological exam (12 or more points of the single system neurologic examination, vital signs counts as 1 point, cranial nerves count for 8 points) was performed.  Blood pressure 160/90, pulse 77, height 5\' 8"  (1.727 m), weight 192 lb 12.8 oz (87.454 kg).  General - Well nourished, well developed, in no apparent distress.  Ophthalmologic - Sharp disc margins OU.   Cardiovascular - Regular rate and rhythm with no murmur.  Mental Status -  Level of arousal and orientation to time, place, and person were intact. Language including expression, naming, repetition, comprehension was assessed and found intact. Fund of Knowledge was assessed and was intact.  Cranial Nerves II - XII - II - Visual field intact OU. III, IV, VI - Extraocular movements intact. V - Facial sensation intact bilaterally. VII - Facial movement intact bilaterally. VIII - Hearing &  vestibular intact bilaterally. X - Palate elevates symmetrically. XI - Chin turning & shoulder shrug intact bilaterally. XII - Tongue protrusion intact.  Motor Strength - The patient's strength was normal in all extremities and pronator drift was absent.  Bulk was normal and fasciculations were absent.   Motor Tone - Muscle tone was assessed at the neck and appendages and was  normal.  Reflexes - The patient's reflexes were 1+ in all extremities and he had no pathological reflexes.  Sensory - Light touch, temperature/pinprick, vibration and proprioception, and Romberg testing were assessed and were normal.    Coordination - The patient had normal movements in the hands and feet with no ataxia or dysmetria.  Tremor was absent.  Gait and Station - The patient's transfers, posture, gait, station, and turns were observed as normal.  Data reviewed: I personally reviewed the images and agree with the radiology interpretations.  Dg Chest 2 View my 01/15/2015  No acute cardiopulmonary process. Possible 7 mm right lower lobe nodule. Recommend correlation with chest CT in the non acute setting.   Ct Head Wo Contrast 01/15/2015  1. Possible ischemic changes in the pons, midbrain, and medulla without associated hemorrhage.  2. Age advanced mild bilateral carotid siphon atherosclerosis.  3. MRI of the brain without contrast may be helpful in further evaluation to confirm or deny the above findings.   MRI / MRA Brain Wo Contrast 01/15/2015  Negative for acute or chronic ischemic change.  CT finding in the brainstem is an artifact.  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   MRI 02/12/15 - Normal MRI of the brain without contrast. 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  CT angiogram of the head and neck 01/16/2015 1. Occlusion of the left common carotid artery and left internal carotid artery from the arch through the skullbase. 2. 85% diameter stenosis proximal right internal carotid artery 3. Right vertebral artery widely patent. Mild to moderate stenosis distal left vertebral artery. 4. 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 This is nonocclusive. There is also probable nonocclusive thrombus in the right  jugular vein.  Carotid duplex 01/15/2015 Right > 80% ICA stenosis at the level of the distal bulb into the proximal ICA.  Left - There appears to be an occlusion of the common carotid and Internal carotid artery.  The right vertebral artery is antegrade and the left carotid artery has a " bunny sign " consistent with a more proximal stenosis  2D echo - Procedure narrative: Transthoracic echocardiography. Imagequality was adequate. The study was technically difficult, as aresult of poor acoustic windows and poor sound wave transmission. - Left ventricle: The cavity size was normal. Systolic function wasnormal. The estimated ejection fraction was in the range of 55%to 60%. Wall motion was normal; there were no regional wallmotion abnormalities. Doppler parameters are consistent withabnormal left ventricular relaxation (grade 1 diastolicdysfunction). Doppler parameters are consistent with highventricular filling pressure. Mild to moderate concentric leftventricular hypertrophy. - Aortic valve: Mildly to moderately calcified annulus. Mildlythickened, mildly calcified leaflets. - Aorta: MIld aortic root dilatation. Aortic root dimension: 41 mm(ED). - Mitral valve: Mildly calcified annulus. Mildly thickened leaflets. There was mild regurgitation. - Left atrium: The atrium was severely dilated. Volume/bsa, ES,(1-plane Simpson&'s, A2C): 41 ml/m^2.  UE and LE venous doppler 01/18/2015 1. Since No DVT or superficial thrombosis noted in bilateral upper and lower extremities.   TEE  01/17/2015 FINDINGS:  Normal LV function, mild to moderate LVH. No intracardiac mass/thrombus/vegetation. No right to left intracardiac shunt. Severe atherosclerosis of the aortic arch. A particularly large ulcerated plaque is seen in the distal arch, across from the origin of the arch vessels. There is a large and highly mobile component, likely source of embolic events.  Hypercoagulable and autoimmue work  up - negative so far  Pan CT  01/17/2015 No CT correlate to the previously questioned right lower lobe pulmonary parenchymal nodule, which may have represented nipple shadow or something overlying the patient. No acute abnormality within the chest, abdomen, or pelvis.  Component     Latest Ref Rng 01/16/2015        10:58 AM  Cholesterol     0 - 200 mg/dL   Triglycerides     <150 mg/dL   HDL Cholesterol     >40 mg/dL   Total CHOL/HDL Ratio        VLDL     0 - 40 mg/dL   LDL (calc)     0 - 99 mg/dL   PTT Lupus Anticoagulant     0.0 - 50.0 sec   DRVVT     0.0 - 55.1 sec   Lupus Anticoag Interp        Beta-2 Glyco I IgG        Beta-2-Glycoprotein I IgM        Beta-2-Glycoprotein I IgA        Anticardiolipin IgG     0 - 14 GPL U/mL   Anticardiolipin IgM     0 - 12 MPL U/mL   Anticardiolipin IgA     0 - 11 APL U/mL   C-ANCA     Neg:<1:20 titer   P-ANCA     Neg:<1:20 titer   Atypical P-ANCA titer     Neg:<1:20 titer   Hemoglobin A1C     4.8 - 5.6 %   Mean Plasma Glucose        Recommendations-F5LEID:        Comment        Recommendations-PTGENE:        Additional Information        Antithrombin Activity     75 - 120 % 115  Protein C, Total     70 - 140 %   Protein S Activity     60 - 145 %   Protein S Ag, Total     58 - 150 %   Protein C Activity     74 - 151 %   HIV Screen 4th Generation wRfx     Non Reactive   Folate     >5.9 ng/mL   ds DNA Ab        Rhuematoid fact SerPl-aCnc     0.0 - 13.9 IU/mL   SSA (Ro) (ENA) Antibody, IgG     <1.0 NEG AI   SSB (La) (ENA) Antibody, IgG     <1.0 NEG AI   C3 Complement     82 - 167 mg/dL   Complement C4, Body Fluid     14 - 44 mg/dL   Homocysteine     0.0 - 15.0 umol/L    Component     Latest Ref Rng 01/16/2015        11:35 AM  Cholesterol     0 - 200 mg/dL   Triglycerides     <150 mg/dL   HDL Cholesterol     >40 mg/dL   Total  CHOL/HDL Ratio        VLDL     0 - 40 mg/dL   LDL  (calc)     0 - 99 mg/dL   PTT Lupus Anticoagulant     0.0 - 50.0 sec 41.2  DRVVT     0.0 - 55.1 sec 34.8  Lupus Anticoag Interp      Comment:  Beta-2 Glyco I IgG      QUANTITY NOT SUFFICIENT, UNABLE TO PERFORM TEST  Beta-2-Glycoprotein I IgM      TNP  Beta-2-Glycoprotein I IgA      TNP  Anticardiolipin IgG     0 - 14 GPL U/mL <9  Anticardiolipin IgM     0 - 12 MPL U/mL <9  Anticardiolipin IgA     0 - 11 APL U/mL <9  C-ANCA     Neg:<1:20 titer   P-ANCA     Neg:<1:20 titer   Atypical P-ANCA titer     Neg:<1:20 titer   Hemoglobin A1C     4.8 - 5.6 %   Mean Plasma Glucose        Recommendations-F5LEID:      Comment  Comment      Comment  Recommendations-PTGENE:      Comment  Additional Information      Comment  Antithrombin Activity     75 - 120 % 118  Protein C, Total     70 - 140 % 133  Protein S Activity     60 - 145 % 49 (L)  Protein S Ag, Total     58 - 150 % 168 (H)  Protein C Activity     74 - 151 % 178 (H)  HIV Screen 4th Generation wRfx     Non Reactive   Folate     >5.9 ng/mL   ds DNA Ab        Rhuematoid fact SerPl-aCnc     0.0 - 13.9 IU/mL   SSA (Ro) (ENA) Antibody, IgG     <1.0 NEG AI   SSB (La) (ENA) Antibody, IgG     <1.0 NEG AI   C3 Complement     82 - 167 mg/dL   Complement C4, Body Fluid     14 - 44 mg/dL   Homocysteine     0.0 - 15.0 umol/L    Component     Latest Ref Rng 01/17/2015 01/19/2015           Cholesterol     0 - 200 mg/dL    Triglycerides     <150 mg/dL    HDL Cholesterol     >40 mg/dL    Total CHOL/HDL Ratio         VLDL     0 - 40 mg/dL    LDL (calc)     0 - 99 mg/dL    PTT Lupus Anticoagulant     0.0 - 50.0 sec    DRVVT     0.0 - 55.1 sec    Lupus Anticoag Interp         Beta-2 Glyco I IgG         Beta-2-Glycoprotein I IgM         Beta-2-Glycoprotein I IgA         Anticardiolipin IgG     0 - 14 GPL U/mL    Anticardiolipin IgM     0 - 12 MPL U/mL    Anticardiolipin IgA  0 - 11 APL U/mL     C-ANCA     Neg:<1:20 titer <1:20   P-ANCA     Neg:<1:20 titer <1:20   Atypical P-ANCA titer     Neg:<1:20 titer <1:20   Hemoglobin A1C     4.8 - 5.6 %    Mean Plasma Glucose         Recommendations-F5LEID:         Comment         Recommendations-PTGENE:         Additional Information         Antithrombin Activity     75 - 120 %    Protein C, Total     70 - 140 %    Protein S Activity     60 - 145 %    Protein S Ag, Total     58 - 150 %    Protein C Activity     74 - 151 %    HIV Screen 4th Generation wRfx     Non Reactive Non Reactive   Folate     >5.9 ng/mL 11.5   ds DNA Ab      <1   Rhuematoid fact SerPl-aCnc     0.0 - 13.9 IU/mL <7.0   SSA (Ro) (ENA) Antibody, IgG     <1.0 NEG AI <1.0 NEG   SSB (La) (ENA) Antibody, IgG     <1.0 NEG AI <1.0 NEG   C3 Complement     82 - 167 mg/dL 137   Complement C4, Body Fluid     14 - 44 mg/dL 28   Homocysteine     0.0 - 15.0 umol/L  6.5    Assessment: As you may recall, he is a 54 y.o. Caucasian male with PMH of HTN, smoker, HLD, s/p left CEA, OSA, gastric ulcer with hemorrhage and obstruction, and diverticulosis admitted to [facility] on 01/20/15 for multiple transient episodes of left-sided numbness and weakness. MRI no infarct but MRA showed left ICA occlusion and CUS showed right ICA > 80% stenosis. Had CTA showed left CCA occlusion, right ICA 85% stenosis and bilateral jugular vein thrombus. TEE showed severe athero of aortic arch, also has large and highly mobile component. He was put on heparin drip. Vascular surgery consulted and he had right CEA done during hospitalization. His heparin was taken off before surgery and did not resume after surgery but discharged with dural antiplatelet to home. During the interval time, pt experience mild GIB , did not require blood transusion. His dural antiplatelet changed to ASA alone. Due to concerns of clotting disorder (left CEA now reoccluded, right ICA 85% stenosis, bilateral jugular vein  thrombosis, and some abnormal labs), will need to further evaluation, including CTA neck and head, TCD bubble study, and EEG. Blood draw for hyercoagulable repeat some labels.   Plan:  - continue ASA and lipitor for stroke prevention - continue protonix for GI ulcer - your BP still high, increase metoprolol to 1.5 tablets twice a day. Continue norvasc and cozaar the same dose - check BP at home and record and bring over to Dr. Linna Darner for BP meds adjustment - due to your right side weakness episodes, will need to do further testing include EEG, CTA head and neck - repeat some abnormal labs for anticoagulation.  - Follow up with your primary care physician for stroke risk factor modification. Recommend maintain blood pressure goal <130/80, diabetes with hemoglobin A1c goal below 6.5% and lipids  with LDL cholesterol goal below 70 mg/dL.  - follow up with GI and vascular surgery - follow up in 6 weeks  I spent more than 25 minutes of face to face time with the patient. Greater than 50% of time was spent in counseling and coordination of care.   Orders Placed This Encounter  Procedures  . CT Angio Neck W/Cm &/Or Wo/Cm    Standing Status: Future     Number of Occurrences:      Standing Expiration Date: 05/25/2016    Order Specific Question:  Reason for Exam (SYMPTOM  OR DIAGNOSIS REQUIRED)    Answer:  TIA    Order Specific Question:  Preferred imaging location?    Answer:  Internal  . CT Angio Head W/Cm &/Or Wo Cm    Standing Status: Future     Number of Occurrences:      Standing Expiration Date: 05/25/2016    Order Specific Question:  Reason for Exam (SYMPTOM  OR DIAGNOSIS REQUIRED)    Answer:  TIA    Order Specific Question:  Preferred imaging location?    Answer:  Internal  . [place] TCD WITHMONITORING    Standing Status: Future     Number of Occurrences:      Standing Expiration Date: 09/25/2015    Order Specific Question:  Reason for Exam (SYMPTOM  OR DIAGNOSIS REQUIRED)    Answer:   TIA frequent    Order Specific Question:  Preferred imaging location?    Answer:  Internal  . Lipid panel  . Protein S activity  . Beta-2 Glycoprotein I Ab,G/M  . CBC (no diff)  . Protein S, total  . Basic metabolic panel  . EEG adult    Standing Status: Future     Number of Occurrences:      Standing Expiration Date: 03/24/2016    No orders of the defined types were placed in this encounter.    Patient Instructions  - continue ASA and lipitor for stroke prevention - continue protonix for GI ulcer - your BP still high, increase metoprolol to 1.5 tablets twice a day. Continue norvasc and cozaar the same dose - check BP at home and record and bring over to Dr. Linna Darner for BP meds adjustment - due to your right side weakness episodes, will need to do further testing include EEG, CTA head and neck - will do blood draw today - Follow up with your primary care physician for stroke risk factor modification. Recommend maintain blood pressure goal <130/80, diabetes with hemoglobin A1c goal below 6.5% and lipids with LDL cholesterol goal below 70 mg/dL.  - follow up with GI and vascular surgery - follow up in 6 weeks.     Rosalin Hawking, MD PhD [facility] [address], [place], [place] [address] [phone]

## 2015-03-28 ENCOUNTER — Ambulatory Visit (INDEPENDENT_AMBULATORY_CARE_PROVIDER_SITE_OTHER): Payer: 59 | Admitting: Neurology

## 2015-03-28 DIAGNOSIS — G451 Carotid artery syndrome (hemispheric): Secondary | ICD-10-CM

## 2015-03-28 NOTE — Procedures (Signed)
    History:  Martin Archer is a 54 year old gentleman with a history of a left internal carotid artery occlusion, 85% no some the right internal carotid artery requiring an endarterectomy. He has had recurring events of right sided numbness and weakness lasting 15-20 minutes. He is being evaluated for this issue.  This is a routine EEG. No skull defects are noted. Medications include Norvasc, aspirin, Lipitor, hydrocodone, losartan, metoprolol, multivitamins, and Protonix.   EEG classification: Normal awake  Description of the recording: The background rhythms of this recording consists of a fairly well modulated medium amplitude alpha rhythm of 9 Hz that is reactive to eye opening and closure. As the record progresses, the patient appears to remain in the waking state throughout the recording. Photic stimulation was not performed. Hyperventilation was performed, resulting in a minimal buildup of the background rhythm activities without significant slowing seen. At no time during the recording does there appear to be evidence of spike or spike wave discharges or evidence of focal slowing. EKG monitor shows no evidence of cardiac rhythm abnormalities with a heart rate of 84.  Impression: This is a normal EEG recording in the waking state. No evidence of ictal or interictal discharges are seen.

## 2015-03-29 ENCOUNTER — Telehealth: Payer: Self-pay | Admitting: *Deleted

## 2015-03-29 LAB — LIPID PANEL
CHOLESTEROL TOTAL: 203 mg/dL — AB (ref 100–199)
Chol/HDL Ratio: 3.9 ratio units (ref 0.0–5.0)
HDL: 52 mg/dL (ref >39)
LDL Calculated: 83 mg/dL (ref 0–99)
Triglycerides: 341 mg/dL — ABNORMAL HIGH (ref 0–149)
VLDL Cholesterol Cal: 68 mg/dL — ABNORMAL HIGH (ref 5–40)

## 2015-03-29 LAB — BASIC METABOLIC PANEL
BUN / CREAT RATIO: 22 — AB (ref 9–20)
BUN: 22 mg/dL (ref 6–24)
CHLORIDE: 100 mmol/L (ref 97–108)
CO2: 24 mmol/L (ref 18–29)
Calcium: 9.3 mg/dL (ref 8.7–10.2)
Creatinine, Ser: 1.02 mg/dL (ref 0.76–1.27)
GFR calc non Af Amer: 82 mL/min/{1.73_m2} (ref >59)
GFR, EST AFRICAN AMERICAN: 95 mL/min/{1.73_m2} (ref >59)
GLUCOSE: 97 mg/dL (ref 65–99)
POTASSIUM: 5.5 mmol/L — AB (ref 3.5–5.2)
Sodium: 139 mmol/L (ref 134–144)

## 2015-03-29 LAB — CBC
HEMATOCRIT: 38.7 % (ref 37.5–51.0)
HEMOGLOBIN: 12.7 g/dL (ref 12.6–17.7)
MCH: 30.2 pg (ref 26.6–33.0)
MCHC: 32.8 g/dL (ref 31.5–35.7)
MCV: 92 fL (ref 79–97)
Platelets: 260 10*3/uL (ref 150–379)
RBC: 4.2 x10E6/uL (ref 4.14–5.80)
RDW: 14.2 % (ref 12.3–15.4)
WBC: 8.7 10*3/uL (ref 3.4–10.8)

## 2015-03-29 LAB — PROTEIN S ACTIVITY: PROTEIN S ACTIVITY: 148 % — AB (ref 60–145)

## 2015-03-29 LAB — BETA-2 GLYCOPROTEIN I AB,G/M: Beta-2 Glyco I IgG: <9 GPI IgG units (ref 0–20)

## 2015-03-29 LAB — PROTEIN S, TOTAL: Protein S Ag, Total: 149 % (ref 58–150)

## 2015-03-29 NOTE — Telephone Encounter (Signed)
-----   Message from Rosalin Hawking, MD sent at 03/29/2015  3:25 PM EDT ----- Hello, I called pt on the number listed but nobody available. Please give pt a call and let him know that his blood test done the other day in our office came back and looks good. His cholesterol improved and his hemoglobin increased. Previous abnormal labs were normalized on the recheck. Let him continue current treatment plan. Thanks much.  Rosalin Hawking, MD PhD Stroke Neurology 03/29/2015 3:25 PM

## 2015-03-29 NOTE — Telephone Encounter (Signed)
LMVM for pt to return call for lab results.  

## 2015-03-29 NOTE — Telephone Encounter (Signed)
Spoke to pt and relayed lab results as well as EEG results.   (normal).  He asked about other tests ordered.   (CTA head and neck at [place], he needed to call them back).  I did not see TCD order in ofv note, so I did not mention this, although noted afterwards.

## 2015-04-15 ENCOUNTER — Telehealth: Payer: Self-pay | Admitting: Internal Medicine

## 2015-04-15 ENCOUNTER — Telehealth: Payer: Self-pay | Admitting: Emergency Medicine

## 2015-04-15 NOTE — Telephone Encounter (Signed)
Pt called back. °

## 2015-04-15 NOTE — Telephone Encounter (Signed)
Spoke with pt. Pt stated that his ankles are swollen 30 mins after being up in the morning. 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 The bottoms of his feet are numb sometimes. He was instructed to reduce to Amlodipine to 1/2 a day, reduce salt intake, and use compression socks, and to call back Monday if it is not any better over the weekend or causing pain. He stated that it is not so much a problem but more of an inconvenience.

## 2015-04-15 NOTE — Telephone Encounter (Signed)
Pt called in and said that his feet are still swelling. They stay swollen all day everyday.  It only goes down when he is in bed at night.  This is a on going problem.  What else can be done?    Best number [phone]

## 2015-04-15 NOTE — Telephone Encounter (Signed)
LVM for pt to call back.

## 2015-04-15 NOTE — Telephone Encounter (Signed)
Returned pt's call, lvm.

## 2015-04-28 ENCOUNTER — Other Ambulatory Visit: Payer: Self-pay | Admitting: Emergency Medicine

## 2015-04-28 ENCOUNTER — Telehealth: Payer: Self-pay | Admitting: Internal Medicine

## 2015-04-28 MED ORDER — ATORVASTATIN CALCIUM 80 MG PO TABS: 80.0000 mg | ORAL_TABLET | Freq: Every day | ORAL | Status: DC

## 2015-04-28 NOTE — Telephone Encounter (Signed)
Pt called in and needs refill on his  atorvastatin (LIPITOR) 80 MG tablet [id number]  [facility] on [address] number [phone]

## 2015-05-11 ENCOUNTER — Telehealth: Payer: Self-pay | Admitting: Neurology

## 2015-05-11 NOTE — Telephone Encounter (Signed)
Patient is calling regarding tests he was supposed to have had after his last OV here on 03-25-15. He states he had called [facility] but they said they do not do these tests there. I see where the patient was to have a TCD and CT of head and neck..  The patient would like a call back to discuss. I took his number then he slammed the phone down.

## 2015-05-12 NOTE — Telephone Encounter (Signed)
Lft vm for patient regarding his procedure for CI of head and neck. Nurse left vm for patient to call [facility] at [phone] to schedule appt. [facility] was unable to get in contact with pt to schedule appt.

## 2015-05-12 NOTE — Telephone Encounter (Signed)
Rn call [facility] imaging about patient having Ct of head/neck and TCD. Rn talk to [place] at [place] at [phone]. Anderson Malta stated they have orders in [facility] and tried to call the patient to schedule an appt but were unable to get in contact with him. She stated they will be doing another call this week.Anderson Malta in scheduling stated patient call himself and schedule.

## 2015-05-16 NOTE — Telephone Encounter (Signed)
Rn receive call from patient regarding his orders for CT angio neck and tcd monitoring test. Pt stated [facility] imaging does not do the tcd monitoring test. Rn stated to patient a call to [place] will be verified. PT left work number for a return call.

## 2015-05-16 NOTE — Telephone Encounter (Signed)
Rn left message for [facility] at [phone] to inquire if they do TCD monitoring test for patients. Pt has an order.

## 2015-05-16 NOTE — Telephone Encounter (Signed)
Rn call patient on his work number to tell him of appt time for CT angio neck. Rn explain he is to not have any food 4 hrs prior to 0100pm appointment. Also he is to only drink fluids. Rn explain he is have someone drive him there. Rn confirm appt with patient for 05-20-15 at 0100pm. Rn confirm with patient he does not have any diabetes, kidney issues, or allergies to contrast. Pt verbalizes appt time and the need to be npor 4 hrs prior. Rn stated to patient he can take his morning medications.

## 2015-05-16 NOTE — Telephone Encounter (Signed)
Rn call patient on work number to clarify his CT angio neck, and TCD.Patient states he still works full time. Rn stated due to the confusion, a call will be made for him at [facility].

## 2015-05-16 NOTE — Telephone Encounter (Signed)
Rn call [facility] at [phone]. Rn talk to [place] about scheduling pt for Ct angio neck. Madaline Savage ask about patients allergies, and is he was a diabetic, or any kidney issues. Rn stated per chart, pt does not have any of the above issues. Madaline Savage stated patient has to not eat any food 4 hrs prior to appt at 01:10pm, but he can take medications and drink liquids.Patient is schedule for 0130pm, but needs to arrive at 0110pm. Rn will call patient to notify him of the appt on 05-20-15 at 0110pm.

## 2015-05-16 NOTE — Telephone Encounter (Signed)
Rn call patient on his cell number at [phone]. PT is schedule for TCD on 05-26-15 at 0815. Nurse told patient to arrive at 0800am. Patient verify appt with nurse. He was schedule by Seth Bake at [employer]

## 2015-05-20 ENCOUNTER — Ambulatory Visit
Admission: RE | Admit: 2015-05-20 | Discharge: 2015-05-20 | Disposition: A | Discharge status: Home / Self Care | Payer: 59 | Source: Ambulatory Visit | Attending: Neurology | Admitting: Neurology

## 2015-05-20 DIAGNOSIS — G451 Carotid artery syndrome (hemispheric): Secondary | ICD-10-CM

## 2015-05-20 MED ORDER — IOPAMIDOL (ISOVUE-370) INJECTION 76%
100.0000 mL | Freq: Once | INTRAVENOUS | Status: AC | PRN
  Administered 2015-05-20: 100 mL via INTRAVENOUS

## 2015-05-26 ENCOUNTER — Other Ambulatory Visit: Payer: 59

## 2015-05-27 ENCOUNTER — Telehealth: Payer: Self-pay

## 2015-05-27 NOTE — Telephone Encounter (Signed)
Called patient, gave results.  ?Patient verbalized understanding.  ? ? ?

## 2015-05-27 NOTE — Telephone Encounter (Signed)
-----   Message from Rosalin Hawking, MD sent at 05/21/2015  2:12 PM EDT ----- Tried to call this pt today for his CTA results. However, he is not available on the phone. Did not leave message. Please call him and let him know that his CTA head and neck better on the right side vessel after the surgery. No change otherwise. Will wait for the TCD next week. If he has questions, please let me know. Thanks.  Rosalin Hawking, MD PhD Stroke Neurology 05/21/2015 2:12 PM

## 2015-06-02 ENCOUNTER — Ambulatory Visit (INDEPENDENT_AMBULATORY_CARE_PROVIDER_SITE_OTHER): Payer: 59

## 2015-06-02 DIAGNOSIS — G451 Carotid artery syndrome (hemispheric): Secondary | ICD-10-CM | POA: Diagnosis not present

## 2015-06-14 ENCOUNTER — Telehealth: Payer: Self-pay | Admitting: *Deleted

## 2015-06-14 NOTE — Telephone Encounter (Signed)
-----   Message from Rosalin Hawking, MD sent at 06/09/2015  4:17 PM EDT ----- Please let the pt know that his TCD emboli monitoring done the other day in our office is negative for emboli. Continue current treatment plan. Thanks.  Rosalin Hawking, MD PhD Stroke Neurology 06/09/2015 4:17 PM

## 2015-06-14 NOTE — Telephone Encounter (Signed)
I called and spoke to pt and gave him the results (negative for emboli) of the TCD study.   He verbalized understanding.

## 2015-06-29 ENCOUNTER — Encounter: Payer: Self-pay | Admitting: Internal Medicine

## 2015-06-29 ENCOUNTER — Ambulatory Visit (INDEPENDENT_AMBULATORY_CARE_PROVIDER_SITE_OTHER): Payer: 59 | Admitting: Internal Medicine

## 2015-06-29 VITALS — BP 130/90 | HR 107 | Temp 98.8°F | Ht 68.0 in | Wt 192.0 lb

## 2015-06-29 DIAGNOSIS — Z0000 Encounter for general adult medical examination without abnormal findings: Secondary | ICD-10-CM | POA: Diagnosis not present

## 2015-06-29 DIAGNOSIS — Z0001 Encounter for general adult medical examination with abnormal findings: Secondary | ICD-10-CM | POA: Insufficient documentation

## 2015-06-29 NOTE — Patient Instructions (Signed)
You had the flu shot today  Please continue all other medications as before, and refills have been done if requested.  Please have the pharmacy call with any other refills you may need.  Please continue your efforts at being more active, low cholesterol diet, and weight control.  You are otherwise up to date with prevention measures today.  Please keep your appointments with your specialists as you may have planned  You will be contacted regarding the referral for: colonoscopy  Please go to the LAB in the Basement (turn left off the elevator) for the tests to be done today  You will be contacted by phone if any changes need to be made immediately.  Otherwise, you will receive a letter about your results with an explanation, but please check with MyChart first.  Please remember to sign up for MyChart if you have not done so, as this will be important to you in the future with finding out test results, communicating by private email, and scheduling acute appointments online when needed.  Please return in 6 months, or sooner if needed

## 2015-06-29 NOTE — Assessment & Plan Note (Signed)

## 2015-06-29 NOTE — Progress Notes (Signed)
Subjective:    Patient ID: Martin Archer, male    DOB: 1960-11-25, 54 y.o.   MRN: [id number]  HPI  Here for wellness and f/u;  Overall doing ok;  Pt denies Chest pain, worsening SOB, DOE, wheezing, orthopnea, PND, worsening LE edema, palpitations, dizziness or syncope.  Pt denies neurological change such as new headache, facial or extremity weakness.  Pt denies polydipsia, polyuria, or low sugar symptoms. Pt states overall good compliance with treatment and medications, good tolerability, and has been trying to follow appropriate diet.  Pt denies worsening depressive symptoms, suicidal ideation or panic. No fever, night sweats, wt loss, loss of appetite, or other constitutional symptoms.  Pt states good ability with ADL's, has low fall risk, home safety reviewed and adequate, no other significant changes in hearing or vision, and only occasionally active with exercise. S/p recent acute stroke; no weakness residual or dizziness.  Had EEG recent per pt, due for f/u carotid u/s soon per pt. Did have incidental episode overnight several n/v, felt warm and chills bu tnot sure about fever and feels ok today.  Denies further worsening reflux, abd pain, dysphagia, n/v, bowel change or blood, some loose stool better with immodium.Marland Kitchen Has had leg cramps a few months ago at night, but last month has been negative.   Past Medical History  Diagnosis Date  . Gastric ulcer with hemorrhage and obstruction 2011    Dr [facility]  . Anemia, unspecified   . Personal history of colonic polyps 01/30/2010    hyperplastic rectal  . Diverticulosis of colon (without mention of hemorrhage)   . Sleep apnea     on CPAP; [facility]  . Hypertension   . Hyperlipemia   . Arthritis    Past Surgical History  Procedure Laterality Date  . Inguinal hernia repair  1963  . Wisdom tooth extraction    . Nasal fracture surgery    . Carotid endarterectomy  11/2006    left Dr Mamie Nick  .  Esophagogastroduodenoscopy Left 06/17/2013    Procedure: ESOPHAGOGASTRODUODENOSCOPY (EGD);  Surgeon: Arta Silence, MD;  Location: [facility] ENDOSCOPY;  Service: Endoscopy;  Laterality: Left;  . Endarterectomy Right 01/19/2015    Procedure: RIGHT CAROTID ENDARTERECTOMY WITH PATCH ANGIOPLASTY;  Surgeon: Mal Misty, MD;  Location: [place];  Service: Vascular;  Laterality: Right;  . Tee without cardioversion N/A 01/17/2015    Procedure: TRANSESOPHAGEAL ECHOCARDIOGRAM (TEE);  Surgeon: Sanda Klein, MD;  Location: [facility] ENDOSCOPY;  Service: Cardiovascular;  Laterality: N/A;    reports that he quit smoking about 6 months ago. His smoking use included Cigarettes. He smoked 1.00 pack per day. He has never used smokeless tobacco. He reports that he drinks alcohol. He reports that he does not use illicit drugs. family history includes Cancer in his maternal grandfather; Diabetes in his father and paternal grandfather; Heart attack in his mother; Heart attack (age of onset: 71) in his brother; Heart attack (age of onset: 64) in his maternal grandfather; Hypertension in his father; Stroke in his father and paternal uncle. There is no history of Colon cancer, Esophageal cancer, or Stomach cancer. Allergies  Allergen Reactions  . Adderall [Amphetamine-Dextroamphet Er]     diarrhea  . Prozac [Fluoxetine Hcl]     diarrhea  . Sertraline Hcl     REACTION: diarrhea   Current Outpatient Prescriptions on File Prior to Visit  Medication Sig Dispense Refill  . amLODipine (NORVASC) 10 MG tablet Take 1 tablet (10 mg total) by  mouth daily. 90 tablet 1  . aspirin 325 MG tablet Take 325 mg by mouth daily.    Marland Kitchen atorvastatin (LIPITOR) 80 MG tablet Take 1 tablet (80 mg total) by mouth daily. 90 tablet 1  . HYDROcodone-acetaminophen (NORCO/VICODIN) 5-325 MG per tablet Take 1 tablet by mouth every 6 (six) hours as needed for moderate pain. 30 tablet 0  . losartan (COZAAR) 100 MG tablet Take 1 tablet (100 mg total) by mouth daily.  30 tablet 2  . Multiple Vitamins-Minerals (MULTIVITAL PO) Take 1 tablet by mouth daily.     . pantoprazole (PROTONIX) 40 MG tablet Take 1 tablet (40 mg total) by mouth 2 (two) times daily. 60 tablet 1  . metoprolol tartrate (LOPRESSOR) 25 MG tablet Take 1 tablet (25 mg total) by mouth 2 (two) times daily. (Patient not taking: Reported on 06/29/2015) 180 tablet 1   No current facility-administered medications on file prior to visit.   Review of Systems Constitutional: Negative for increased diaphoresis, other activity, appetite or siginficant weight change other than noted HENT: Negative for worsening hearing loss, ear pain, facial swelling, mouth sores and neck stiffness.   Eyes: Negative for other worsening pain, redness or visual disturbance.  Respiratory: Negative for shortness of breath and wheezing  Cardiovascular: Negative for chest pain and palpitations.  Gastrointestinal: Negative for diarrhea, blood in stool, abdominal distention or other pain Genitourinary: Negative for hematuria, flank pain or change in urine volume.  Musculoskeletal: Negative for myalgias or other joint complaints.  Skin: Negative for color change and wound or drainage.  Neurological: Negative for syncope and numbness. other than noted Hematological: Negative for adenopathy. or other swelling Psychiatric/Behavioral: Negative for hallucinations, SI, self-injury, decreased concentration or other worsening agitation.      Objective:   Physical Exam BP 130/90 mmHg  Pulse 107  Temp(Src) 98.8 F (37.1 C) (Oral)  Ht 5\' 8"  (1.727 m)  Wt 192 lb (87.091 kg)  BMI 29.20 kg/m2  SpO2 96% VS noted,  Constitutional: Pt is oriented to person, place, and time. Appears well-developed and well-nourished, in no significant distress Head: Normocephalic and atraumatic.  Right Ear: External ear normal.  Left Ear: External ear normal.  Nose: Nose normal.  Mouth/Throat: Oropharynx is clear and moist.  Eyes: Conjunctivae and  EOM are normal. Pupils are equal, round, and reactive to light.  Neck: Normal range of motion. Neck supple. No JVD present. No tracheal deviation present or significant neck LA or mass Cardiovascular: Normal rate, regular rhythm, normal heart sounds and intact distal pulses.   Pulmonary/Chest: Effort normal and breath sounds without rales or wheezing  Abdominal: Soft. Bowel sounds are normal. NT. No HSM  Musculoskeletal: Normal range of motion. Exhibits no edema.  Lymphadenopathy:  Has no cervical adenopathy.  Neurological: Pt is alert and oriented to person, place, and time. Pt has normal reflexes. No cranial nerve deficit. Motor grossly intact Skin: Skin is warm and dry. No rash noted.  Psychiatric:  Has normal mood and affect. Behavior is normal.     Assessment & Plan:

## 2015-06-29 NOTE — Progress Notes (Signed)
Pre visit review using our clinic review tool, if applicable. No additional management support is needed unless otherwise documented below in the visit note. 

## 2015-06-30 ENCOUNTER — Other Ambulatory Visit (INDEPENDENT_AMBULATORY_CARE_PROVIDER_SITE_OTHER): Payer: 59

## 2015-06-30 DIAGNOSIS — Z0000 Encounter for general adult medical examination without abnormal findings: Secondary | ICD-10-CM

## 2015-06-30 DIAGNOSIS — R7989 Other specified abnormal findings of blood chemistry: Secondary | ICD-10-CM

## 2015-06-30 DIAGNOSIS — Z0189 Encounter for other specified special examinations: Secondary | ICD-10-CM | POA: Diagnosis not present

## 2015-06-30 LAB — URINALYSIS, ROUTINE W REFLEX MICROSCOPIC
Bilirubin Urine: NEGATIVE
Hgb urine dipstick: NEGATIVE
Ketones, ur: NEGATIVE
Leukocytes, UA: NEGATIVE
Nitrite: NEGATIVE
PH: 6 (ref 5.0–8.0)
RBC / HPF: NONE SEEN (ref 0–?)
SPECIFIC GRAVITY, URINE: 1.02 (ref 1.000–1.030)
TOTAL PROTEIN, URINE-UPE24: NEGATIVE
UROBILINOGEN UA: 0.2 (ref 0.0–1.0)
Urine Glucose: NEGATIVE
WBC, UA: NONE SEEN (ref 0–?)

## 2015-06-30 LAB — BASIC METABOLIC PANEL
BUN: 20 mg/dL (ref 6–23)
CALCIUM: 9.7 mg/dL (ref 8.4–10.5)
CHLORIDE: 101 meq/L (ref 96–112)
CO2: 31 meq/L (ref 19–32)
CREATININE: 1.14 mg/dL (ref 0.40–1.50)
GFR: 70.51 mL/min (ref >60.00)
Glucose, Bld: 92 mg/dL (ref 70–99)
Potassium: 4.5 mEq/L (ref 3.5–5.1)
SODIUM: 141 meq/L (ref 135–145)

## 2015-06-30 LAB — LDL CHOLESTEROL, DIRECT: LDL DIRECT: 109 mg/dL

## 2015-06-30 LAB — LIPID PANEL
CHOL/HDL RATIO: 4
Cholesterol: 227 mg/dL — ABNORMAL HIGH (ref 0–200)
HDL: 53.7 mg/dL (ref >39.00)
NONHDL: 173.62
Triglycerides: 311 mg/dL — ABNORMAL HIGH (ref 0.0–149.0)
VLDL: 62.2 mg/dL — AB (ref 0.0–40.0)

## 2015-06-30 LAB — HEPATIC FUNCTION PANEL
ALK PHOS: 124 U/L — AB (ref 39–117)
ALT: 26 U/L (ref 0–53)
AST: 24 U/L (ref 0–37)
Albumin: 4.4 g/dL (ref 3.5–5.2)
BILIRUBIN DIRECT: 0.1 mg/dL (ref 0.0–0.3)
BILIRUBIN TOTAL: 0.7 mg/dL (ref 0.2–1.2)
Total Protein: 7.4 g/dL (ref 6.0–8.3)

## 2015-06-30 LAB — CBC WITH DIFFERENTIAL/PLATELET
BASOS PCT: 0.5 % (ref 0.0–3.0)
Basophils Absolute: 0.1 10*3/uL (ref 0.0–0.1)
Eosinophils Absolute: 0 10*3/uL (ref 0.0–0.7)
Eosinophils Relative: 0.1 % (ref 0.0–5.0)
HEMATOCRIT: 43.9 % (ref 39.0–52.0)
Hemoglobin: 14.8 g/dL (ref 13.0–17.0)
LYMPHS ABS: 2 10*3/uL (ref 0.7–4.0)
LYMPHS PCT: 16.9 % (ref 12.0–46.0)
MCHC: 33.8 g/dL (ref 30.0–36.0)
MCV: 89.3 fl (ref 78.0–100.0)
MONOS PCT: 9.7 % (ref 3.0–12.0)
Monocytes Absolute: 1.1 10*3/uL — ABNORMAL HIGH (ref 0.1–1.0)
NEUTROS ABS: 8.4 10*3/uL — AB (ref 1.4–7.7)
NEUTROS PCT: 72.8 % (ref 43.0–77.0)
PLATELETS: 301 10*3/uL (ref 150.0–400.0)
RBC: 4.92 Mil/uL (ref 4.22–5.81)
RDW: 14.5 % (ref 11.5–15.5)
WBC: 11.5 10*3/uL — ABNORMAL HIGH (ref 4.0–10.5)

## 2015-06-30 LAB — TSH: TSH: 1.29 u[IU]/mL (ref 0.35–4.50)

## 2015-06-30 LAB — PSA: PSA: 0.81 ng/mL (ref 0.10–4.00)

## 2015-07-01 LAB — HEPATITIS C ANTIBODY: HCV Ab: NEGATIVE

## 2015-08-09 ENCOUNTER — Ambulatory Visit: Payer: 59 | Admitting: Vascular Surgery

## 2015-08-09 ENCOUNTER — Encounter (HOSPITAL_COMMUNITY): Payer: 59

## 2015-08-25 ENCOUNTER — Inpatient Hospital Stay (HOSPITAL_COMMUNITY): Admission: RE | Admit: 2015-08-25 | Payer: 59 | Source: Ambulatory Visit

## 2015-08-26 ENCOUNTER — Encounter: Payer: Self-pay | Admitting: Internal Medicine

## 2015-08-30 ENCOUNTER — Ambulatory Visit: Payer: 59 | Admitting: Vascular Surgery

## 2015-09-01 ENCOUNTER — Ambulatory Visit (HOSPITAL_COMMUNITY)
Admission: RE | Admit: 2015-09-01 | Discharge: 2015-09-01 | Disposition: A | Discharge status: Home / Self Care | Payer: 59 | Source: Ambulatory Visit | Attending: Vascular Surgery | Admitting: Vascular Surgery

## 2015-09-01 ENCOUNTER — Encounter (HOSPITAL_COMMUNITY): Payer: 59

## 2015-09-01 DIAGNOSIS — Z48812 Encounter for surgical aftercare following surgery on the circulatory system: Secondary | ICD-10-CM | POA: Insufficient documentation

## 2015-09-01 DIAGNOSIS — E785 Hyperlipidemia, unspecified: Secondary | ICD-10-CM | POA: Diagnosis not present

## 2015-09-01 DIAGNOSIS — I6521 Occlusion and stenosis of right carotid artery: Secondary | ICD-10-CM | POA: Insufficient documentation

## 2015-09-01 DIAGNOSIS — I10 Essential (primary) hypertension: Secondary | ICD-10-CM | POA: Diagnosis not present

## 2015-09-09 ENCOUNTER — Encounter: Payer: Self-pay | Admitting: Vascular Surgery

## 2015-09-09 ENCOUNTER — Other Ambulatory Visit: Payer: Self-pay | Admitting: Internal Medicine

## 2015-09-13 ENCOUNTER — Ambulatory Visit: Payer: 59 | Admitting: Vascular Surgery

## 2015-09-16 ENCOUNTER — Encounter: Payer: Self-pay | Admitting: Vascular Surgery

## 2015-09-18 ENCOUNTER — Emergency Department (HOSPITAL_COMMUNITY): Payer: 59

## 2015-09-18 ENCOUNTER — Encounter (HOSPITAL_COMMUNITY): Payer: Self-pay | Admitting: Emergency Medicine

## 2015-09-18 ENCOUNTER — Observation Stay (HOSPITAL_COMMUNITY)
Admission: EM | Admit: 2015-09-18 | Discharge: 2015-09-21 | Disposition: A | Discharge status: Home / Self Care | Payer: 59 | Attending: Internal Medicine | Admitting: Internal Medicine

## 2015-09-18 ENCOUNTER — Inpatient Hospital Stay (HOSPITAL_COMMUNITY): Payer: 59

## 2015-09-18 DIAGNOSIS — G4733 Obstructive sleep apnea (adult) (pediatric): Secondary | ICD-10-CM | POA: Insufficient documentation

## 2015-09-18 DIAGNOSIS — E785 Hyperlipidemia, unspecified: Secondary | ICD-10-CM | POA: Diagnosis not present

## 2015-09-18 DIAGNOSIS — R410 Disorientation, unspecified: Secondary | ICD-10-CM | POA: Diagnosis not present

## 2015-09-18 DIAGNOSIS — N182 Chronic kidney disease, stage 2 (mild): Secondary | ICD-10-CM | POA: Diagnosis not present

## 2015-09-18 DIAGNOSIS — I10 Essential (primary) hypertension: Secondary | ICD-10-CM | POA: Diagnosis not present

## 2015-09-18 DIAGNOSIS — Z7982 Long term (current) use of aspirin: Secondary | ICD-10-CM | POA: Diagnosis not present

## 2015-09-18 DIAGNOSIS — I129 Hypertensive chronic kidney disease with stage 1 through stage 4 chronic kidney disease, or unspecified chronic kidney disease: Secondary | ICD-10-CM | POA: Insufficient documentation

## 2015-09-18 DIAGNOSIS — Z87891 Personal history of nicotine dependence: Secondary | ICD-10-CM | POA: Diagnosis not present

## 2015-09-18 DIAGNOSIS — K259 Gastric ulcer, unspecified as acute or chronic, without hemorrhage or perforation: Secondary | ICD-10-CM | POA: Insufficient documentation

## 2015-09-18 DIAGNOSIS — G8191 Hemiplegia, unspecified affecting right dominant side: Secondary | ICD-10-CM | POA: Diagnosis not present

## 2015-09-18 DIAGNOSIS — G459 Transient cerebral ischemic attack, unspecified: Secondary | ICD-10-CM | POA: Diagnosis not present

## 2015-09-18 DIAGNOSIS — K921 Melena: Secondary | ICD-10-CM | POA: Insufficient documentation

## 2015-09-18 DIAGNOSIS — K920 Hematemesis: Secondary | ICD-10-CM | POA: Diagnosis not present

## 2015-09-18 DIAGNOSIS — Z8673 Personal history of transient ischemic attack (TIA), and cerebral infarction without residual deficits: Secondary | ICD-10-CM | POA: Insufficient documentation

## 2015-09-18 DIAGNOSIS — G451 Carotid artery syndrome (hemispheric): Secondary | ICD-10-CM | POA: Diagnosis not present

## 2015-09-18 DIAGNOSIS — M1990 Unspecified osteoarthritis, unspecified site: Secondary | ICD-10-CM | POA: Insufficient documentation

## 2015-09-18 DIAGNOSIS — K254 Chronic or unspecified gastric ulcer with hemorrhage: Secondary | ICD-10-CM | POA: Insufficient documentation

## 2015-09-18 DIAGNOSIS — I6521 Occlusion and stenosis of right carotid artery: Secondary | ICD-10-CM | POA: Diagnosis not present

## 2015-09-18 DIAGNOSIS — Z8719 Personal history of other diseases of the digestive system: Secondary | ICD-10-CM | POA: Insufficient documentation

## 2015-09-18 DIAGNOSIS — Z79899 Other long term (current) drug therapy: Secondary | ICD-10-CM | POA: Diagnosis not present

## 2015-09-18 DIAGNOSIS — R531 Weakness: Secondary | ICD-10-CM | POA: Diagnosis present

## 2015-09-18 DIAGNOSIS — I639 Cerebral infarction, unspecified: Secondary | ICD-10-CM

## 2015-09-18 DIAGNOSIS — D500 Iron deficiency anemia secondary to blood loss (chronic): Secondary | ICD-10-CM

## 2015-09-18 DIAGNOSIS — R110 Nausea: Secondary | ICD-10-CM

## 2015-09-18 LAB — CBC
HCT: 30.7 % — ABNORMAL LOW (ref 39.0–52.0)
Hemoglobin: 10.5 g/dL — ABNORMAL LOW (ref 13.0–17.0)
MCH: 31.7 pg (ref 26.0–34.0)
MCHC: 34.2 g/dL (ref 30.0–36.0)
MCV: 92.7 fL (ref 78.0–100.0)
Platelets: 242 10*3/uL (ref 150–400)
RBC: 3.31 MIL/uL — ABNORMAL LOW (ref 4.22–5.81)
RDW: 12.8 % (ref 11.5–15.5)
WBC: 12.8 10*3/uL — ABNORMAL HIGH (ref 4.0–10.5)

## 2015-09-18 LAB — DIFFERENTIAL
Basophils Absolute: 0 10*3/uL (ref 0.0–0.1)
Basophils Relative: 0 %
Eosinophils Absolute: 0 10*3/uL (ref 0.0–0.7)
Eosinophils Relative: 0 %
Lymphocytes Relative: 22 %
Lymphs Abs: 2.8 10*3/uL (ref 0.7–4.0)
Monocytes Absolute: 0.7 10*3/uL (ref 0.1–1.0)
Monocytes Relative: 6 %
Neutro Abs: 9.2 10*3/uL — ABNORMAL HIGH (ref 1.7–7.7)
Neutrophils Relative %: 72 %

## 2015-09-18 LAB — I-STAT CHEM 8, ED
BUN: 78 mg/dL — ABNORMAL HIGH (ref 6–20)
Calcium, Ion: 1.15 mmol/L (ref 1.12–1.23)
Chloride: 107 mmol/L (ref 101–111)
Creatinine, Ser: 1.3 mg/dL — ABNORMAL HIGH (ref 0.61–1.24)
Glucose, Bld: 132 mg/dL — ABNORMAL HIGH (ref 65–99)
HCT: 33 % — ABNORMAL LOW (ref 39.0–52.0)
Hemoglobin: 11.2 g/dL — ABNORMAL LOW (ref 13.0–17.0)
Potassium: 4.8 mmol/L (ref 3.5–5.1)
Sodium: 139 mmol/L (ref 135–145)
TCO2: 21 mmol/L (ref 0–100)

## 2015-09-18 LAB — PROTIME-INR
INR: 1.07 (ref 0.00–1.49)
Prothrombin Time: 14.1 seconds (ref 11.6–15.2)

## 2015-09-18 LAB — URINALYSIS, ROUTINE W REFLEX MICROSCOPIC
Bilirubin Urine: NEGATIVE
Bilirubin Urine: NEGATIVE
GLUCOSE, UA: NEGATIVE mg/dL
Glucose, UA: NEGATIVE mg/dL
HGB URINE DIPSTICK: NEGATIVE
Hgb urine dipstick: NEGATIVE
KETONES UR: NEGATIVE mg/dL
Ketones, ur: NEGATIVE mg/dL
LEUKOCYTES UA: NEGATIVE
Leukocytes, UA: NEGATIVE
Nitrite: NEGATIVE
Nitrite: NEGATIVE
PH: 5 (ref 5.0–8.0)
PROTEIN: NEGATIVE mg/dL
Protein, ur: NEGATIVE mg/dL
Specific Gravity, Urine: 1.021 (ref 1.005–1.030)
Specific Gravity, Urine: 1.02 (ref 1.005–1.030)
pH: 5 (ref 5.0–8.0)

## 2015-09-18 LAB — LIPASE, BLOOD: LIPASE: 25 U/L (ref 11–51)

## 2015-09-18 LAB — ETHANOL: Alcohol, Ethyl (B): <5 mg/dL (ref <5)

## 2015-09-18 LAB — RAPID URINE DRUG SCREEN, HOSP PERFORMED
Amphetamines: NOT DETECTED
Amphetamines: NOT DETECTED
BARBITURATES: NOT DETECTED
BENZODIAZEPINES: NOT DETECTED
Barbiturates: NOT DETECTED
Benzodiazepines: NOT DETECTED
Cocaine: NOT DETECTED
Cocaine: NOT DETECTED
Opiates: POSITIVE — AB
Opiates: POSITIVE — AB
Tetrahydrocannabinol: POSITIVE — AB
Tetrahydrocannabinol: POSITIVE — AB

## 2015-09-18 LAB — COMPREHENSIVE METABOLIC PANEL
ALT: 20 U/L (ref 17–63)
AST: 20 U/L (ref 15–41)
Albumin: 3.4 g/dL — ABNORMAL LOW (ref 3.5–5.0)
Alkaline Phosphatase: 68 U/L (ref 38–126)
Anion gap: 11 (ref 5–15)
BUN: 74 mg/dL — ABNORMAL HIGH (ref 6–20)
CO2: 21 mmol/L — ABNORMAL LOW (ref 22–32)
Calcium: 9 mg/dL (ref 8.9–10.3)
Chloride: 107 mmol/L (ref 101–111)
Creatinine, Ser: 1.31 mg/dL — ABNORMAL HIGH (ref 0.61–1.24)
GFR calc Af Amer: >60 mL/min (ref >60)
GFR calc non Af Amer: 59 mL/min — ABNORMAL LOW (ref >60)
Glucose, Bld: 136 mg/dL — ABNORMAL HIGH (ref 65–99)
Potassium: 4.9 mmol/L (ref 3.5–5.1)
Sodium: 139 mmol/L (ref 135–145)
Total Bilirubin: 0.4 mg/dL (ref 0.3–1.2)
Total Protein: 6 g/dL — ABNORMAL LOW (ref 6.5–8.1)

## 2015-09-18 LAB — POC OCCULT BLOOD, ED: Fecal Occult Bld: POSITIVE — AB

## 2015-09-18 LAB — I-STAT TROPONIN, ED: Troponin i, poc: 0.01 ng/mL (ref 0.00–0.08)

## 2015-09-18 LAB — AMMONIA: Ammonia: 36 umol/L — ABNORMAL HIGH (ref 9–35)

## 2015-09-18 LAB — APTT: aPTT: 20 seconds — ABNORMAL LOW (ref 24–37)

## 2015-09-18 LAB — TSH: TSH: 1.423 u[IU]/mL (ref 0.350–4.500)

## 2015-09-18 MED ORDER — ATORVASTATIN CALCIUM 80 MG PO TABS
80.0000 mg | ORAL_TABLET | Freq: Every day | ORAL | Status: DC
  Administered 2015-09-18 – 2015-09-21 (×4): 80 mg via ORAL
  Filled 2015-09-18 (×4): qty 1

## 2015-09-18 MED ORDER — HYDRALAZINE HCL 20 MG/ML IJ SOLN: 10.0000 mg | Freq: Four times a day (QID) | INTRAMUSCULAR | Status: DC | PRN

## 2015-09-18 MED ORDER — SENNOSIDES-DOCUSATE SODIUM 8.6-50 MG PO TABS
1.0000 | ORAL_TABLET | Freq: Every evening | ORAL | Status: DC | PRN
Start: 2015-09-18 — End: 2015-09-21

## 2015-09-18 MED ORDER — STROKE: EARLY STAGES OF RECOVERY BOOK
Freq: Once | Status: AC
  Administered 2015-09-18: 21:00:00
  Filled 2015-09-18: qty 1

## 2015-09-18 MED ORDER — PANTOPRAZOLE SODIUM 40 MG IV SOLR
40.0000 mg | Freq: Two times a day (BID) | INTRAVENOUS | Status: DC
  Administered 2015-09-19 – 2015-09-20 (×3): 40 mg via INTRAVENOUS
  Filled 2015-09-18 (×4): qty 40

## 2015-09-18 MED ORDER — SODIUM CHLORIDE 0.9 % IV SOLN
INTRAVENOUS | Status: AC
  Administered 2015-09-18: 21:00:00 via INTRAVENOUS

## 2015-09-18 MED ORDER — SODIUM CHLORIDE 0.9 % IV SOLN: INTRAVENOUS | Status: DC

## 2015-09-18 NOTE — ED Notes (Addendum)
Returns from MRI speaking in full sentences and able to tell the presentation of his illness today. He reports diarrhea starting at 0500; he states he felt weak on the right side and then fell in the kitchen because he could not move his right arm or leg. States he remembers getting here and hearing me questioning him, but he couldn't answer me. He has full movement of all extremities at this time, states the right side feels weird. Speaking clearly. Sister is present, she states she heard the fall and he seemed disoriented so she called EMS. She said he was alert and talking when he left with EMS, just confused.

## 2015-09-18 NOTE — ED Notes (Signed)
Patient transported to MRI 

## 2015-09-18 NOTE — ED Notes (Signed)
Hospitalist at bedside 

## 2015-09-18 NOTE — H&P (Signed)
History and Physical  Martin Archer [id number] DOB: [birth year] DOA: 09/18/2015   PCP: [name], MD  Referring Physician: ED/ Franne Forts, PA-C  Chief Complaint: R- hemiparesis, vomiting, diarrhea  HPI:  55 year old male with a history of gastric ulcer, GI bleed, TIA, carotid stenosis status post bilateral carotid endarterectomy, hyperlipidemia, hypertension presented with one-day history of vomiting and diarrhea. The patient is quite a difficult historian. He frequently changes his history even during the same interview. In addition, he has told different providers at different history. Even during the same interview, the patient would give a slightly different history if asked a similar question at different time and interview. Nevertheless, the general history, as best as can be discerned from the patient's different historical resuscitations such that he has had a one-day history of vomiting and diarrhea that began on the early morning of 09/18/2015. He stated that he had 2 episodes of emesis that was tinged with blood at home. Although he initially stated he only had 1 bowel movement, he later told me that he had 5-6 loose bowel movements today. He denied any hematochezia, melena, fevers, chills. He does complain of lower abdominal pain. He is unable to tell me how long he has had his lower abdominal pain, but states that it is recent. Apparently, the patient developed right-sided weakness to the point he was unable to stand when he went to the bathroom in the morning of 09/18/2015. His sister heard him fall and went to check on him. She could not get him up from the floor. As a result, EMS was activated. The patient's sister stated that he was confused. Apparently, when EMS arrived the patient was conversant and alert. During the trip to the hospital, the patient had abrupt mental status change to the point he was not following commands and not moving his right side. Code stroke was  activated. The patient was seen by neurology. CT of the brain was negative. In the emergency department, the patient was afebrile and hemodynamically stable. Hemoccult was positive with brown stool. WBC was 12.8. Serum creatinine 1.31. LFTs were unremarkable.  Assessment/Plan: Right hemiparesis - code stroke was activated, and neurology is following the patient  - CT brain negative  -MRI brain negative for acute findings  -The patient's deficits have completely resolved at the time he was evaluated by neurology and myself.  -Carotid ultrasound  -Echo  -LDL  -Hemoglobin A1c  -The patient had a similar presentation, but with left hemiparesis in May 2016--full stroke workup including TEE was unremarkable except for 85% right carotid stenosis for which he went right carotid endarterectomy  -allow for permissive hypertension  -Hydralazine prn SBP >210 Hematemesis  -The patient has dropped 4 g of hemoglobin since 06/30/2015  -[facility] was consulted by ED -Hemoccult positive with brown stool  -PPI twice a day  -Patient has history of gastric ulcer  -Denies NSAIDs  -hold ASA for now until cleared by GI Diarrhea  -Unclear if the patient is truly having diarrhea due to his different accounts  -C. Difficile -Stool pathogen panel  Hypertension  -Hold amlodipine, metoprolol, losartan to allow for permissive hypertension  -Evaluate for restart on 09/19/2015  Hyperlipidemia  -Continue statin  Delirium  -urine drug screen  -Urinalysis  -According to the patient's sister at the bedside, the patient is back to his baseline  -TSH  -EEG  -EKG without concerning ischemic changes  CKD stage II  -Baseline creatinine 1.1-1.4  Past Medical History  Diagnosis Date  . Gastric ulcer with hemorrhage and obstruction 2011    Dr [facility]  . Anemia, unspecified   . Personal history of colonic polyps 01/30/2010    hyperplastic rectal  . Diverticulosis of colon (without mention of  hemorrhage)   . Sleep apnea     on CPAP; [facility]  . Hypertension   . Hyperlipemia   . Arthritis    Past Surgical History  Procedure Laterality Date  . Inguinal hernia repair  1963  . Wisdom tooth extraction    . Nasal fracture surgery    . Carotid endarterectomy  11/2006    left Dr Mamie Nick  . Esophagogastroduodenoscopy Left 06/17/2013    Procedure: ESOPHAGOGASTRODUODENOSCOPY (EGD);  Surgeon: Arta Silence, MD;  Location[facility] ENDOSCOPY;  Service: Endoscopy;  Laterality: Left;  . Endarterectomy Right 01/19/2015    Procedure: RIGHT CAROTID ENDARTERECTOMY WITH PATCH ANGIOPLASTY;  Surgeon: Mal Misty, MD;  Location: [place];  Service: Vascular;  Laterality: Right;  . Tee without cardioversion N/A 01/17/2015    Procedure: TRANSESOPHAGEAL ECHOCARDIOGRAM (TEE);  Surgeon: Sanda Klein, MD;  Location: [facility] ENDOSCOPY;  Service: Cardiovascular;  Laterality: N/A;   Social History:  reports that he quit smoking about 8 months ago. His smoking use included Cigarettes. He smoked 1.00 pack per day. He has never used smokeless tobacco. He reports that he drinks alcohol. He reports that he does not use illicit drugs.   Family History  Problem Relation Age of Onset  . Diabetes Father   . Hypertension Father   . Stroke Father     in late 41s  . Heart attack Mother     in 23s  . Heart attack Brother 48  . Diabetes Paternal Grandfather   . Cancer Maternal Grandfather     unknown type  . Heart attack Maternal Grandfather 76  . Colon cancer Neg Hx   . Esophageal cancer Neg Hx   . Stomach cancer Neg Hx   . Stroke Paternal Uncle     in late 26s     Allergies  Allergen Reactions  . Adderall [Amphetamine-Dextroamphet Er] Diarrhea  . Prozac [Fluoxetine Hcl] Diarrhea  . Sertraline Hcl Diarrhea      Prior to Admission medications   Medication Sig Start Date End Date Taking? Authorizing Provider  amLODipine (NORVASC) 10 MG tablet Take 1 tablet (10 mg total) by mouth daily.  03/10/15   Hendricks Limes, MD  aspirin 325 MG tablet Take 325 mg by mouth daily.    Historical Provider, MD  atorvastatin (LIPITOR) 80 MG tablet Take 1 tablet (80 mg total) by mouth daily. 04/28/15   Hendricks Limes, MD  HYDROcodone-acetaminophen (NORCO/VICODIN) 5-325 MG per tablet Take 1 tablet by mouth every 6 (six) hours as needed for moderate pain. 01/20/15   Ulyses Amor, PA-C  losartan (COZAAR) 100 MG tablet Take 1 tablet (100 mg total) by mouth daily. 12/08/14   Hendricks Limes, MD  metoprolol tartrate (LOPRESSOR) 25 MG tablet TAKE 1 TABLET BY MOUTH TWICE DAILY 09/10/15   Biagio Borg, MD  Multiple Vitamins-Minerals (MULTIVITAL PO) Take 1 tablet by mouth daily.     Historical Provider, MD  pantoprazole (PROTONIX) 40 MG tablet Take 1 tablet (40 mg total) by mouth 2 (two) times daily. 02/08/15   Ladene Artist, MD    Review of Systems:  Constitutional:  No weight loss, night sweats, Fevers, chills, fatigue.  Head&Eyes: No headache.  No  vision loss.  No eye pain or scotoma ENT:  No Difficulty swallowing,Tooth/dental problems,Sore throat,  No ear ache, post nasal drip,  Cardio-vascular:  No chest pain, Orthopnea, PND, swelling in lower extremities,  dizziness, palpitations  GI:  No  loss of appetite, hematochezia, melena, heartburn, indigestion, Resp:  No shortness of breath with exertion or at rest. No cough. No coughing up of blood .No wheezing.No chest wall deformity  Skin:  no rash or lesions.  GU:  no dysuria, change in color of urine, no urgency or frequency. No flank pain.  Musculoskeletal:  No joint pain or swelling. No decreased range of motion. No back pain.  Psych:  No change in mood or affect. No depression or anxiety. Neurologic: No headache, no dysesthesia,  no vision loss. No syncope  Physical Exam: Filed Vitals:   09/18/15 1548 09/18/15 1600 09/18/15 1615 09/18/15 1817  BP: 141/113 115/84 140/92 158/79  Pulse: 100 98 93 109  Temp:  98.5 F (36.9 C)      TempSrc:  Oral    Resp: 16 23 22 15   SpO2: 98% 100% 100% 100%   General:  A&O x 3, NAD, nontoxic, pleasant/cooperative Head/Eye: No conjunctival hemorrhage, no icterus, [place]/AT, No nystagmus ENT:  No icterus,  No thrush, good dentition, no pharyngeal exudate Neck:  No masses, no lymphadenpathy, no bruits CV:  RRR, no rub, no gallop, no S3 Lung:  CTAB, good air movement, no wheeze, no rhonchi Abdomen: soft/NT, +BS, nondistended, no peritoneal signs Ext: No cyanosis, No rashes, No petechiae, No lymphangitis, No edema Neuro: CNII-XII intact, strength 4/5 in bilateral upper and lower extremities, no dysmetria  Labs on Admission:  Basic Metabolic Panel:  Recent Labs Lab 09/18/15 1543 09/18/15 1549  NA 139 139  K 4.9 4.8  CL 107 107  CO2 21*  --   GLUCOSE 136* 132*  BUN 74* 78*  CREATININE 1.31* 1.30*  CALCIUM 9.0  --    Liver Function Tests:  Recent Labs Lab 09/18/15 1543  AST 20  ALT 20  ALKPHOS 68  BILITOT 0.4  PROT 6.0*  ALBUMIN 3.4*   No results for input(s): LIPASE, AMYLASE in the last 168 hours.  Recent Labs Lab 09/18/15 1656  AMMONIA 36*   CBC:  Recent Labs Lab 09/18/15 1543 09/18/15 1549  WBC 12.8*  --   NEUTROABS 9.2*  --   HGB 10.5* 11.2*  HCT 30.7* 33.0*  MCV 92.7  --   PLT 242  --    Cardiac Enzymes: No results for input(s): CKTOTAL, CKMB, CKMBINDEX, TROPONINI in the last 168 hours. BNP: Invalid input(s): POCBNP CBG: No results for input(s): GLUCAP in the last 168 hours.  Radiological Exams on Admission: Ct Head Wo Contrast  09/18/2015  CLINICAL DATA:  Patient became unresponsive, aphasic with right-sided weakness and facial droop. EXAM: CT HEAD WITHOUT CONTRAST TECHNIQUE: Contiguous axial images were obtained from the base of the skull through the vertex without intravenous contrast. COMPARISON:  CT of the head 05/20/2015 FINDINGS: Brain: No evidence of acute infarction, hemorrhage, extra-axial collection, ventriculomegaly, or mass  effect. 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 Vascular: No hyperdense vessel or unexpected calcification. Skull: Negative for fracture or focal lesion. Sinuses/Orbits: No acute findings. Other: None. IMPRESSION: No acute intracranial abnormality. These results were called by telephone at the time of interpretation on 09/18/2015 at 3:46 pm to Dr. Carmin Muskrat , who verbally acknowledged these results. Electronically Signed   By: Fidela Salisbury M.D.   On: 09/18/2015  15:50   Mr Herby Abraham Contrast  09/18/2015  CLINICAL DATA:  56 year old hypertensive male with hyperlipidemia complaining of aphasia and right leg weakness. Subsequent encounter. EXAM: MRI HEAD WITHOUT CONTRAST TECHNIQUE: Multiplanar, multiecho pulse sequences of the brain and surrounding structures were obtained without intravenous contrast. COMPARISON:  09/18/2015 CT.  02/12/2015 MR. FINDINGS: Exam is motion degraded. No acute infarct or intracranial hemorrhage. Chronically occluded left internal carotid artery. Increased signal within the left middle cerebral artery branch suggestive of slow flow indicating inadequate collateral supply. This was noted on prior exam although slightly more apparent on the current exam. Small caliber intracranial vasculature may indicate changes of atherosclerosis. No intracranial mass lesion noted on this unenhanced exam. No hydrocephalus. Mild exophthalmos.  Orbital structures otherwise unremarkable. Partially empty non expanded sella incidentally noted. Cervical medullary junction unremarkable. Minimal mucosal thickening ethmoid sinus air cells. IMPRESSION: Exam is motion degraded. No acute infarct or intracranial hemorrhage. Chronically occluded left internal carotid artery. Increased signal within the left middle cerebral artery branch suggestive of slow flow indicating inadequate collateral supply. This was noted on prior exam although slightly more apparent on the current exam. Small caliber  intracranial vasculature may indicate changes of atherosclerosis. No intracranial mass lesion noted on this unenhanced exam. Mild exophthalmos. Electronically Signed   By: Genia Del M.D.   On: 09/18/2015 18:01    EKG: Independently reviewed. Sinus rhythm, no ST-T wave changes    Time spent:60 minutes Code Status:   FULL Family Communication:   Sister updated at bedside   Faven Watterson, DO  [facility] Pager [phone]  If 7PM-7AM, please contact night-coverage [email or web address] Password [facility] 09/18/2015, 6:41 PM

## 2015-09-18 NOTE — ED Provider Notes (Signed)
CSN: [id number]     Arrival date & time 09/18/15  1512 History   First MD Initiated Contact with Patient 09/18/15 1515     Chief Complaint  Patient presents with  . Weakness    HPI   55 year old male presents via EMS. EMS reports that they were called out for nausea vomiting and weakness. They report that while in the ambulance he had an abrupt onset of altered mental status, not following commands, and not moving his right side.  At time of evaluation patient was not responding to verbal stimuli, intermittently was opening his eyes and mumbling, and would not move his right upper and lower extremities. No signs of trauma on exam, a reflexive of the right lower extremity. Leg falls without resistance, right arm pulling against mind. Pupils are equal round reactive to light. Stroke initiated  Review shows patient has a history of left internal carotid artery occlusion, 85% of the right internal carotid artery requiring endarterectomy. Patient has had recurring events of right-sided numbness and weakness lasting approximately 15-20 minutes.    Past Medical History  Diagnosis Date  . Gastric ulcer with hemorrhage and obstruction 2011    Dr Fidela [facility]  . Anemia, unspecified   . Personal history of colonic polyps 01/30/2010    hyperplastic rectal  . Diverticulosis of colon (without mention of hemorrhage)   . Sleep apnea     on CPAP; [facility]  . Hypertension   . Hyperlipemia   . Arthritis    Past Surgical History  Procedure Laterality Date  . Inguinal hernia repair  1963  . Wisdom tooth extraction    . Nasal fracture surgery    . Carotid endarterectomy  11/2006    left Dr Mamie Nick  . Esophagogastroduodenoscopy Left 06/17/2013    Procedure: ESOPHAGOGASTRODUODENOSCOPY (EGD);  Surgeon: Arta Silence, MD;  Location[facility] END[facility]COPY;  Service: Endoscopy;  Laterality: Left;  . Endarterectomy Right 01/19/2015    Procedure: RIGHT CAROTID ENDARTERECTOMY WITH PATCH  ANGIOPLASTY;  Surgeon: Mal Misty, MD;  Location: [place];  Service: Vascular;  Laterality: Right;  . Tee without cardioversion N/A 01/17/2015    Procedure: TRANSESOPHAGEAL ECHOCARDIOGRAM (TEE);  Surgeon: Sanda Klein, MD;  Location: [facility] ENDOSCOPY;  Service: Cardiovascular;  Laterality: N/A;   Family History  Problem Relation Age of Onset  . Diabetes Father   . Hypertension Father   . Stroke Father     in late 35s  . Heart attack Mother     in 23s  . Heart attack Brother 87  . Diabetes Paternal Grandfather   . Cancer Maternal Grandfather     unknown type  . Heart attack Maternal Grandfather 76  . Colon cancer Neg Hx   . Esophageal cancer Neg Hx   . Stomach cancer Neg Hx   . Stroke Paternal Uncle     in late 6s   Social History  Substance Use Topics  . Smoking status: Former Smoker -- 1.00 packs/day    Types: Cigarettes    Quit date: 12/26/2014  . Smokeless tobacco: Never Used     Comment: smoked 1976-2007 & 2009- present , up to 2 ppd, average > 1ppd  . Alcohol Use: 0.0 oz/week    0 Standard drinks or equivalent per week     Comment: currently at [facility] for treatment.     Review of Systems  All other systems reviewed and are negative.     Allergies  Adderall; Prozac; Sertraline hcl; and Other  Home Medications   Prior to Admission medications   Medication Sig Start Date End Date Taking? Authorizing Provider  amLODipine (NORVASC) 10 MG tablet Take 1 tablet (10 mg total) by mouth daily. 03/10/15  Yes Hendricks Limes, MD  aspirin 325 MG tablet Take 325 mg by mouth daily. Reported on 09/18/2015   Yes Historical Provider, MD  atorvastatin (LIPITOR) 80 MG tablet Take 1 tablet (80 mg total) by mouth daily. 04/28/15  Yes Hendricks Limes, MD  losartan (COZAAR) 100 MG tablet Take 1 tablet (100 mg total) by mouth daily. 12/08/14  Yes Hendricks Limes, MD  metoprolol tartrate (LOPRESSOR) 25 MG tablet TAKE 1 TABLET BY MOUTH TWICE DAILY 09/10/15  Yes Biagio Borg, MD   Multiple Vitamins-Minerals (MULTIVITAL PO) Take 1 tablet by mouth daily.    Yes Historical Provider, MD  pantoprazole (PROTONIX) 40 MG tablet Take 1 tablet (40 mg total) by mouth 2 (two) times daily. 02/08/15  Yes Ladene Artist, MD  HYDROcodone-acetaminophen (NORCO/VICODIN) 5-325 MG per tablet Take 1 tablet by mouth every 6 (six) hours as needed for moderate pain. 01/20/15   Ulyses Amor, PA-C   BP 143/99 mmHg  Pulse 119  Temp(Src) 98.2 F (36.8 C) (Oral)  Resp 20  SpO2 99%    Physical Exam  Constitutional:  Unresponsive  Eyes: Pupils are equal, round, and reactive to light.  Cardiovascular: Normal rate, regular rhythm, normal heart sounds and intact distal pulses.  Exam reveals no gallop and no friction rub.   No murmur heard. Pulmonary/Chest: Breath sounds normal. No respiratory distress. He has no wheezes. He has no rales. He exhibits no tenderness.  Genitourinary: Guaiac positive stool.  Neurological:  Right-sided strength deficits, patient moving left upper and lower extremity, no right-sided involvement or reflex. Pupils equal round and reactive to light  Skin: Skin is warm and dry. No rash noted. No erythema. No pallor.  Nursing note and vitals reviewed.   ED Course  Procedures (including critical care time) Labs Review Labs Reviewed  APTT - Abnormal; Notable for the following:    aPTT 20 (*)    All other components within normal limits  CBC - Abnormal; Notable for the following:    WBC 12.8 (*)    RBC 3.31 (*)    Hemoglobin 10.5 (*)    HCT 30.7 (*)    All other components within normal limits  DIFFERENTIAL - Abnormal; Notable for the following:    Neutro Abs 9.2 (*)    All other components within normal limits  COMPREHENSIVE METABOLIC PANEL - Abnormal; Notable for the following:    CO2 21 (*)    Glucose, Bld 136 (*)    BUN 74 (*)    Creatinine, Ser 1.31 (*)    Total Protein 6.0 (*)    Albumin 3.4 (*)    GFR calc non Af Amer 59 (*)    All other components  within normal limits  URINE RAPID DRUG SCREEN, HOSP PERFORMED - Abnormal; Notable for the following:    Opiates POSITIVE (*)    Tetrahydrocannabinol POSITIVE (*)    All other components within normal limits  AMMONIA - Abnormal; Notable for the following:    Ammonia 36 (*)    All other components within normal limits  I-STAT CHEM 8, ED - Abnormal; Notable for the following:    BUN 78 (*)    Creatinine, Ser 1.30 (*)    Glucose, Bld 132 (*)    Hemoglobin 11.2 (*)  HCT 33.0 (*)    All other components within normal limits  POC OCCULT BLOOD, ED - Abnormal; Notable for the following:    Fecal Occult Bld POSITIVE (*)    All other components within normal limits  URINE CULTURE  GASTROINTESTINAL PANEL BY PCR, STOOL (REPLACES STOOL CULTURE)  C DIFFICILE QUICK SCREEN W PCR REFLEX  ETHANOL  PROTIME-INR  URINALYSIS, ROUTINE W REFLEX MICROSCOPIC (NOT AT [facility])  HEMOGLOBIN A1C  LIPID PANEL  LIPASE, BLOOD  URINALYSIS, ROUTINE W REFLEX MICROSCOPIC (NOT AT [facility])  URINE RAPID DRUG SCREEN, HOSP PERFORMED  TSH  I-STAT TROPOININ, ED    Imaging Review Ct Head Wo Contrast  09/18/2015  CLINICAL DATA:  Patient became unresponsive, aphasic with right-sided weakness and facial droop. EXAM: CT HEAD WITHOUT CONTRAST TECHNIQUE: Contiguous axial images were obtained from the base of the skull through the vertex without intravenous contrast. COMPARISON:  CT of the head 05/20/2015 FINDINGS: Brain: No evidence of acute infarction, hemorrhage, extra-axial collection, ventriculomegaly, or mass effect. 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 Vascular: No hyperdense vessel or unexpected calcification. Skull: Negative for fracture or focal lesion. Sinuses/Orbits: No acute findings. Other: None. IMPRESSION: No acute intracranial abnormality. These results were called by telephone at the time of interpretation on 09/18/2015 at 3:46 pm to Dr. Carmin Muskrat , who verbally acknowledged these results.  Electronically Signed   By: Fidela Salisbury M.D.   On: 09/18/2015 15:50   [name]  09/18/2015  CLINICAL DATA:  55 year old hypertensive male with hyperlipidemia complaining of aphasia and right leg weakness. Subsequent encounter. EXAM: MRI HEAD WITHOUT CONTRAST TECHNIQUE: Multiplanar, multiecho pulse sequences of the brain and surrounding structures were obtained without intravenous contrast. COMPARISON:  09/18/2015 CT.  02/12/2015 MR. FINDINGS: Exam is motion degraded. No acute infarct or intracranial hemorrhage. Chronically occluded left internal carotid artery. Increased signal within the left middle cerebral artery branch suggestive of slow flow indicating inadequate collateral supply. This was noted on prior exam although slightly more apparent on the current exam. Small caliber intracranial vasculature may indicate changes of atherosclerosis. No intracranial mass lesion noted on this unenhanced exam. No hydrocephalus. Mild exophthalmos.  Orbital structures otherwise unremarkable. Partially empty non expanded sella incidentally noted. Cervical medullary junction unremarkable. Minimal mucosal thickening ethmoid sinus air cells. IMPRESSION: Exam is motion degraded. No acute infarct or intracranial hemorrhage. Chronically occluded left internal carotid artery. Increased signal within the left middle cerebral artery branch suggestive of slow flow indicating inadequate collateral supply. This was noted on prior exam although slightly more apparent on the current exam. Small caliber intracranial vasculature may indicate changes of atherosclerosis. No intracranial mass lesion noted on this unenhanced exam. Mild exophthalmos. Electronically Signed   By: Genia Del M.D.   On: 09/18/2015 18:01   I have personally reviewed and evaluated these images and lab results as part of my medical decision-making.   EKG Interpretation   Date/Time:  Sunday September 18 2015 15:27:41 EST Ventricular Rate:   104 PR Interval:  112 QRS Duration: 98 QT Interval:  351 QTC Calculation: 462 R Axis:   45 Text Interpretation:  Pacemaker spikes or artifacts Sinus tachycardia  Ventricular premature complex Aberrant conduction of SV complex(es)  Probable left atrial enlargement Baseline wander in lead(s) V4 Sinus  tachycardia Artifact T wave abnormality abn Confirmed by Carmin Muskrat   MD [phone]) on 09/18/2015 3:33:40 PM      MDM   Final diagnoses:  Stroke [facility])    Labs:  Imaging: CT  head without  Consults:  Therapeutics:  Discharge Meds:   Assessment/Plan: 55 year old male presents with right-sided weakness, and altered mental status. This was abrupt onset. Symptoms started approximately 5 minutes before I evaluated the patient, old stroke was called immediately, patient was taken to the [place]. By the time the patient returned from the [place] he began to move his right lower extremity and right upper extremity, he continued to have altered mental status. Patient rapidly improved while here in the ED, at the time neurology evaluation patient was alert and oriented but still seems somewhat altered. The patient's wife was at bedside and reports patient has been reporting dark stools. Patient has a history of gastric ulcer. Patient is not on any prescription anticoagulants, currently takes aspirin daily.  8:25 PM  Patient currently alert and enough to provide sufficient history but still seems slightly altered. He reports that he is feeling weak prior to EMS, reports that he had right-sided "deficits, weakness" prior to EMS arrival. EMS notes that there is no mention of this. She admits to dark stools over the last several days, with no bright red per rectum.   Spoke with Dr. Carles Collet with hospitalist service who agreed to evaluate the patient for possible admission.  Consult to [facility] gastroenterology, spoke with Dr. Havery Moros who advised IV Protonix, trend H&H and they would evaluate  tomorrow morning. Instructed contact him if any hemodynamic changes occurred.             Okey Regal, PA-C 09/18/15 2025  Carmin Muskrat, MD 09/18/15 [phone]

## 2015-09-18 NOTE — Consult Note (Signed)
Referring Physician: ED    Chief Complaint: code stroke, non verbal, right sided weakness  HPI:                                                                                                                                         Martin Archer is an 55 y.o. male with a past medical history significant for HTN, HLD, OSA, gastric ulcer with GI bleed, who initially came to the ED due to weakness, vomiting, vertigo, and dark stools. EMS stated he gave a long list of symptoms in route. While in the ED he was reportedly aphasic and not moving his right leg which prompted code stroke activation. However, his deficits virtually went away after returning from CT.  Nurse mentioned that patient was not speaking on her initial assessment but " saying random words like "super" repeatedly, but not answering any questions, aphasic, crying and laughing intermittently". CT head showed no acute abnormality. Denies HA, vertigo, double vision, visual disturbances, focal weakness or numbness.  Date last known well: 09/18/15 Time last known well: 3:15 pm tPA Given: no, symptoms resolved NIHSS: 5 MRS: 0  Past Medical History  Diagnosis Date  . Gastric ulcer with hemorrhage and obstruction 2011    Dr F[facility]a [facility]  . Anemia, unspecified   . Personal history of colonic polyps 01/30/2010    hyperplastic rectal  . Diverticulosis of colon (without mention of hemorrhage)   . Sleep apnea     on CPAP; [facility]  . Hypertension   . Hyperlipemia   . Arthritis     Past Surgical History  Procedure Laterality Date  . Inguinal hernia repair  1963  . Wisdom tooth extraction    . Nasal fracture surgery    . Carotid endarterectomy  11/2006    left Dr Mamie Nick  . Esophagogastroduodenoscopy Left 06/17/2013    Procedure: ESOPHAGOGASTRODUODENOSCOPY (EGD);  Surgeon: Arta Silence, MD;  Location: [facility] ENDOSCOPY;  Service: Endoscopy;  Laterality: Left;  . Endarterectomy Right 01/19/2015     Procedure: RIGHT CAROTID ENDARTERECTOMY WITH PATCH ANGIOPLASTY;  Surgeon: Mal Misty, MD;  Location: [place];  Service: Vascular;  Laterality: Right;  . Tee without cardioversion N/A 01/17/2015    Procedure: TRANSESOPHAGEAL ECHOCARDIOGRAM (TEE);  Surgeon: Sanda Klein, MD;  Location: [facility] ENDOSCOPY;  Service: Cardiovascular;  Laterality: N/A;    Family History  Problem Relation Age of Onset  . Diabetes Father   . Hypertension Father   . Stroke Father     in late 36s  . Heart attack Mother     in 61s  . Heart attack Brother 27  . Diabetes Paternal Grandfather   . Cancer Maternal Grandfather     unknown type  . Heart attack Maternal Grandfather 76  . Colon cancer Neg Hx   . Esophageal cancer Neg Hx   . Stomach cancer Neg Hx   .  Stroke Paternal Uncle     in late 3s   Social History:  reports that he quit smoking about 8 months ago. His smoking use included Cigarettes. He smoked 1.00 pack per day. He has never used smokeless tobacco. He reports that he drinks alcohol. He reports that he does not use illicit drugs. Family history: no epilepsy, MS, or brain tumor Allergies:  Allergies  Allergen Reactions  . Adderall [Amphetamine-Dextroamphet Er]     diarrhea  . Prozac [Fluoxetine Hcl]     diarrhea  . Sertraline Hcl     REACTION: diarrhea    Medications:                                                                                                                           I have reviewed the patient's current medications.  ROS:                                                                                                                                       History obtained from chart review and the patient  General ROS: negative for - chills, fatigue, fever, night sweats, weight gain or weight loss Psychological ROS: negative for - behavioral disorder, hallucinations, memory difficulties, mood swings or suicidal ideation Ophthalmic ROS: negative for - blurry vision,  double vision, eye pain or loss of vision ENT ROS: negative for - epistaxis, nasal discharge, oral lesions, sore throat, tinnitus or vertigo Allergy and Immunology ROS: negative for - hives or itchy/watery eyes Hematological and Lymphatic ROS: negative for - bleeding problems, bruising or swollen lymph nodes Endocrine ROS: negative for - galactorrhea, hair pattern changes, polydipsia/polyuria or temperature intolerance Respiratory ROS: negative for - cough, hemoptysis, shortness of breath or wheezing Cardiovascular ROS: negative for - chest pain, dyspnea on exertion, edema or irregular heartbeat Gastrointestinal ROS: negative for - abdominal pain, diarrhea, hematemesis, nausea/vomiting or stool incontinence Genito-Urinary ROS: negative for - dysuria, hematuria, incontinence or urinary frequency/urgency Musculoskeletal ROS: negative for - joint swelling or muscular weakness Neurological ROS: as noted in HPI Dermatological ROS: negative for rash and skin lesion changes  Physical exam:  Constitutional: well developed, pleasant male in no apparent distress. Blood pressure 115/84, pulse 98, temperature 98.5 F (36.9 C), temperature source Oral, resp. rate 23, SpO2 100 %. Eyes: no jaundice or exophthalmos.  Head: normocephalic. Neck: supple, no bruits, no JVD. Cardiac: no  murmurs. Lungs: clear. Abdomen: soft, no tender, no mass. Extremities: no edema, clubbing, or cyanosis.  Skin: no rash  Neurologic Examination:                                                                                                      General: NAD Mental Status: Alert, oriented, thought content appropriate.  Speech fluent without evidence of aphasia.  Able to follow 3 step commands without difficulty. Cranial Nerves: II: Discs flat bilaterally; Visual fields grossly normal, pupils equal, round, reactive to light and accommodation III,IV, VI: ptosis not present, extra-ocular motions intact bilaterally V,VII: smile  symmetric, facial light touch sensation normal bilaterally VIII: hearing normal bilaterally IX,X: uvula rises symmetrically XI: bilateral shoulder shrug XII: midline tongue extension without atrophy or fasciculations  Motor: Right : Upper extremity   5/5    Left:     Upper extremity   5/5  Lower extremity   5/5     Lower extremity   5/5 Tone and bulk:normal tone throughout; no atrophy noted Sensory: Pinprick and light touch intact throughout, bilaterally Deep Tendon Reflexes:  Right: Upper Extremity   Left: Upper extremity   biceps (C-5 to C-6) 2/4   biceps (C-5 to C-6) 2/4 tricep (C7) 2/4    triceps (C7) 2/4 Brachioradialis (C6) 2/4  Brachioradialis (C6) 2/4  Lower Extremity Lower Extremity  quadriceps (L-2 to L-4) 2/4   quadriceps (L-2 to L-4) 2/4 Achilles (S1) 2/4   Achilles (S1) 2/4  Plantars: Right: downgoing   Left: downgoing Cerebellar: normal finger-to-nose,  normal heel-to-shin test Gait:  No tested due to multiple leads   Results for orders placed or performed during the hospital encounter of 09/18/15 (from the past 48 hour(s))  Ethanol     Status: None   Collection Time: 09/18/15  3:43 PM  Result Value Ref Range   Alcohol, Ethyl (B) <5 <5 mg/dL    Comment:        LOWEST DETECTABLE LIMIT FOR SERUM ALCOHOL IS 5 mg/dL FOR MEDICAL PURPOSES ONLY   Protime-INR     Status: None   Collection Time: 09/18/15  3:43 PM  Result Value Ref Range   Prothrombin Time 14.1 11.6 - 15.2 seconds   INR 1.07 0.00 - 1.49  APTT     Status: Abnormal   Collection Time: 09/18/15  3:43 PM  Result Value Ref Range   aPTT 20 (L) 24 - 37 seconds  CBC     Status: Abnormal   Collection Time: 09/18/15  3:43 PM  Result Value Ref Range   WBC 12.8 (H) 4.0 - 10.5 K/uL   RBC 3.31 (L) 4.22 - 5.81 MIL/uL   Hemoglobin 10.5 (L) 13.0 - 17.0 g/dL   HCT 30.7 (L) 39.0 - 52.0 %   MCV 92.7 78.0 - 100.0 fL   MCH 31.7 26.0 - 34.0 pg   MCHC 34.2 30.0 - 36.0 g/dL   RDW 12.8 11.5 - 15.5 %   Platelets  242 150 - 400 K/uL  Differential     Status: Abnormal   Collection Time: 09/18/15  3:43 PM  Result Value Ref Range   Neutrophils Relative % 72 %   Neutro Abs 9.2 (H) 1.7 - 7.7 K/uL   Lymphocytes Relative 22 %   Lymphs Abs 2.8 0.7 - 4.0 K/uL   Monocytes Relative 6 %   Monocytes Absolute 0.7 0.1 - 1.0 K/uL   Eosinophils Relative 0 %   Eosinophils Absolute 0.0 0.0 - 0.7 K/uL   Basophils Relative 0 %   Basophils Absolute 0.0 0.0 - 0.1 K/uL  Comprehensive metabolic panel     Status: Abnormal   Collection Time: 09/18/15  3:43 PM  Result Value Ref Range   Sodium 139 135 - 145 mmol/L   Potassium 4.9 3.5 - 5.1 mmol/L   Chloride 107 101 - 111 mmol/L   CO2 21 (L) 22 - 32 mmol/L   Glucose, Bld 136 (H) 65 - 99 mg/dL   BUN 74 (H) 6 - 20 mg/dL   Creatinine, Ser 1.31 (H) 0.61 - 1.24 mg/dL   Calcium 9.0 8.9 - 10.3 mg/dL   Total Protein 6.0 (L) 6.5 - 8.1 g/dL   Albumin 3.4 (L) 3.5 - 5.0 g/dL   AST 20 15 - 41 U/L   ALT 20 17 - 63 U/L   Alkaline Phosphatase 68 38 - 126 U/L   Total Bilirubin 0.4 0.3 - 1.2 mg/dL   GFR calc non Af Amer 59 (L) >60 mL/min   GFR calc Af Amer >60 >60 mL/min    Comment: (NOTE) The eGFR has been calculated using the CKD EPI equation. This calculation has not been validated in all clinical situations. eGFR's persistently <60 mL/min signify possible Chronic Kidney Disease.    Anion gap 11 5 - 15  I-stat troponin, ED (not at [facility], [facility])     Status: None   Collection Time: 09/18/15  3:47 PM  Result Value Ref Range   Troponin i, poc 0.01 0.00 - 0.08 ng/mL   Comment 3            Comment: Due to the release kinetics of cTnI, a negative result within the first hours of the onset of symptoms does not rule out myocardial infarction with certainty. If myocardial infarction is still suspected, repeat the test at appropriate intervals.   I-Stat Chem 8, ED  (not at [facility], [facility])     Status: Abnormal   Collection Time: 09/18/15  3:49 PM  Result Value Ref Range   Sodium 139  135 - 145 mmol/L   Potassium 4.8 3.5 - 5.1 mmol/L   Chloride 107 101 - 111 mmol/L   BUN 78 (H) 6 - 20 mg/dL   Creatinine, Ser 1.30 (H) 0.61 - 1.24 mg/dL   Glucose, Bld 132 (H) 65 - 99 mg/dL   Calcium, Ion 1.15 1.12 - 1.23 mmol/L   TCO2 21 0 - 100 mmol/L   Hemoglobin 11.2 (L) 13.0 - 17.0 g/dL   HCT 33.0 (L) 39.0 - 52.0 %   Ct Head Wo Contrast  09/18/2015  CLINICAL DATA:  Patient became unresponsive, aphasic with right-sided weakness and facial droop. EXAM: CT HEAD WITHOUT CONTRAST TECHNIQUE: Contiguous axial images were obtained from the base of the skull through the vertex without intravenous contrast. COMPARISON:  CT of the head 05/20/2015 FINDINGS: Brain: No evidence of acute infarction, hemorrhage, extra-axial collection, ventriculomegaly, or mass effect. 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 Vascular: No hyperdense vessel or unexpected calcification. Skull: Negative for fracture or focal lesion. Sinuses/Orbits: No acute findings. Other: None. IMPRESSION: No acute  intracranial abnormality. These results were called by telephone at the time of interpretation on 09/18/2015 at 3:46 pm to Dr. Carmin Muskrat , who verbally acknowledged these results. Electronically Signed   By: Fidela Salisbury M.D.   On: 09/18/2015 15:50     Assessment: 55 y.o. male with transient episode of language impairment and right LE weakness. Unremarkable CT brain .Rermarkably improved, I can not find objective lateralizing neurological signs on exam. ?? Left brain TIA. Admit to medicine for TIA work up. Stroke team will follow up tomorrow.  Stroke Risk Factors -  HTN, HLD  Plan: 1. HgbA1c, fasting lipid panel 2. MRI, MRA  of the brain without contrast 3. Echocardiogram 4. Carotid dopplers 5. Prophylactic therapy-aspirin 6. Risk factor modification 7. Telemetry monitoring 8. Frequent neuro checks 9. PT/OT SLP  Dorian Pod, MD [facility] [phone]  09/18/2015, 4:40 PM

## 2015-09-18 NOTE — Progress Notes (Signed)
Pt arrived to 5M03 from ED. Pt ambulated from stretcher to bed without difficulty.  Pt is alert and oriented.  Orders clarified to keep pt NPO in case of GI procedure tomorrow.  Safety measures in place.  Will continue to monitor.   Fredrich Romans, RN

## 2015-09-18 NOTE — ED Notes (Signed)
From home: called EMS for weakness, vomiting, vertigo, and dark stools. EMS stated he gave a long list of symptoms in route. On moving patient into room, he was aphasic and could not get out any words. Was not moving right leg at the time. Notified MD. Evelina Bucy to CT and on return to room moving all extremities, saying random words like "super" repeatedly, but not answering any questions, aphasic, crying and laughing intermittently.

## 2015-09-19 ENCOUNTER — Inpatient Hospital Stay (HOSPITAL_COMMUNITY): Payer: 59

## 2015-09-19 ENCOUNTER — Encounter: Payer: Self-pay | Admitting: Vascular Surgery

## 2015-09-19 ENCOUNTER — Ambulatory Visit (HOSPITAL_COMMUNITY): Payer: 59

## 2015-09-19 ENCOUNTER — Inpatient Hospital Stay (HOSPITAL_BASED_OUTPATIENT_CLINIC_OR_DEPARTMENT_OTHER): Payer: 59

## 2015-09-19 DIAGNOSIS — I10 Essential (primary) hypertension: Secondary | ICD-10-CM | POA: Diagnosis not present

## 2015-09-19 DIAGNOSIS — D500 Iron deficiency anemia secondary to blood loss (chronic): Secondary | ICD-10-CM | POA: Diagnosis not present

## 2015-09-19 DIAGNOSIS — I6789 Other cerebrovascular disease: Secondary | ICD-10-CM

## 2015-09-19 DIAGNOSIS — K921 Melena: Secondary | ICD-10-CM

## 2015-09-19 DIAGNOSIS — G8191 Hemiplegia, unspecified affecting right dominant side: Secondary | ICD-10-CM | POA: Diagnosis not present

## 2015-09-19 DIAGNOSIS — D62 Acute posthemorrhagic anemia: Secondary | ICD-10-CM | POA: Diagnosis not present

## 2015-09-19 DIAGNOSIS — E785 Hyperlipidemia, unspecified: Secondary | ICD-10-CM | POA: Diagnosis not present

## 2015-09-19 DIAGNOSIS — K920 Hematemesis: Secondary | ICD-10-CM | POA: Diagnosis not present

## 2015-09-19 LAB — URINE CULTURE: CULTURE: NO GROWTH

## 2015-09-19 LAB — BASIC METABOLIC PANEL
ANION GAP: 10 (ref 5–15)
BUN: 46 mg/dL — ABNORMAL HIGH (ref 6–20)
CALCIUM: 8.9 mg/dL (ref 8.9–10.3)
CO2: 24 mmol/L (ref 22–32)
Chloride: 107 mmol/L (ref 101–111)
Creatinine, Ser: 1.28 mg/dL — ABNORMAL HIGH (ref 0.61–1.24)
Glucose, Bld: 110 mg/dL — ABNORMAL HIGH (ref 65–99)
Potassium: 4.2 mmol/L (ref 3.5–5.1)
Sodium: 141 mmol/L (ref 135–145)

## 2015-09-19 LAB — GASTROINTESTINAL PANEL BY PCR, STOOL (REPLACES STOOL CULTURE)
ADENOVIRUS F40/41: NOT DETECTED
Astrovirus: NOT DETECTED
CYCLOSPORA CAYETANENSIS: NOT DETECTED
Campylobacter species: NOT DETECTED
Cryptosporidium: NOT DETECTED
E. COLI O157: NOT DETECTED
ENTAMOEBA HISTOLYTICA: NOT DETECTED
Enteroaggregative E coli (EAEC): NOT DETECTED
Enteropathogenic E coli (EPEC): NOT DETECTED
Enterotoxigenic E coli (ETEC): NOT DETECTED
Giardia lamblia: NOT DETECTED
Norovirus GI/GII: NOT DETECTED
PLESIMONAS SHIGELLOIDES: NOT DETECTED
ROTAVIRUS A: NOT DETECTED
SALMONELLA SPECIES: NOT DETECTED
Sapovirus (I, II, IV, and V): NOT DETECTED
Shiga like toxin producing E coli (STEC): NOT DETECTED
Shigella/Enteroinvasive E coli (EIEC): NOT DETECTED
Vibrio cholerae: NOT DETECTED
Vibrio species: NOT DETECTED
YERSINIA ENTEROCOLITICA: NOT DETECTED

## 2015-09-19 LAB — CBC
HCT: 25.3 % — ABNORMAL LOW (ref 39.0–52.0)
HEMOGLOBIN: 8.6 g/dL — AB (ref 13.0–17.0)
MCH: 31.4 pg (ref 26.0–34.0)
MCHC: 34 g/dL (ref 30.0–36.0)
MCV: 92.3 fL (ref 78.0–100.0)
Platelets: 197 10*3/uL (ref 150–400)
RBC: 2.74 MIL/uL — AB (ref 4.22–5.81)
RDW: 13 % (ref 11.5–15.5)
WBC: 9.5 10*3/uL (ref 4.0–10.5)

## 2015-09-19 LAB — LIPID PANEL
CHOLESTEROL: 179 mg/dL (ref 0–200)
HDL: 26 mg/dL — ABNORMAL LOW (ref >40)
LDL CALC: UNDETERMINED mg/dL (ref 0–99)
TRIGLYCERIDES: 617 mg/dL — AB (ref <150)
Total CHOL/HDL Ratio: 6.9 RATIO
VLDL: UNDETERMINED mg/dL (ref 0–40)

## 2015-09-19 LAB — C DIFFICILE QUICK SCREEN W PCR REFLEX
C DIFFICILE (CDIFF) INTERP: NEGATIVE
C DIFFICILE (CDIFF) TOXIN: NEGATIVE
C Diff antigen: NEGATIVE

## 2015-09-19 MED ORDER — PANTOPRAZOLE SODIUM 40 MG IV SOLR: 40.0000 mg | Freq: Two times a day (BID) | INTRAVENOUS | Status: DC

## 2015-09-19 MED ORDER — METOPROLOL TARTRATE 25 MG PO TABS
25.0000 mg | ORAL_TABLET | Freq: Two times a day (BID) | ORAL | Status: DC
  Administered 2015-09-19: 25 mg via ORAL
  Filled 2015-09-19: qty 1

## 2015-09-19 NOTE — Evaluation (Addendum)
Speech Language Pathology Evaluation Patient Details Name: Martin Archer MRN: [id number] DOB: 03-19-1961 Today's Date: 09/19/2015 Time: [id number] SLP Time Calculation (min) (ACUTE ONLY): 19 min  Problem List:  Patient Active Problem List   Diagnosis Date Noted  . Right hemiparesis ([place]) 09/18/2015  . Chronic blood loss anemia 09/18/2015  . Hematemesis 09/18/2015  . Wellness examination 06/29/2015  . History of CEA (carotid endarterectomy) 03/25/2015  . HLD (hyperlipidemia) 03/25/2015  . Tobacco use disorder 03/25/2015  . Carotid artery occlusion with infarction ([place]) 03/25/2015  . Carotid aneurysm, right ([place]) 01/19/2015  . Stroke ([place])   . Aortic arch atherosclerosis ([place])   . Carotid stenosis 01/16/2015  . Thrombus: BILATERAL EXTERNAL JUGULAR VEINS per CT angio head and neck 01/16/15 01/16/2015  . DVT (deep venous thrombosis) ([place])   . Numbness and tingling of left arm and leg 01/15/2015  . CVA (cerebral infarction)   . Paresthesia 12/08/2014  . 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 ADD (attention deficit disorder) 07/14/2013  . Nicotine dependence 06/18/2013  . Back pain, chronic 06/17/2013  . ACUTE GASTRIC ULCER W/HEMORRHAGE AND OBSTRUCTION 02/28/2010  . PERSONAL HX COLONIC POLYPS 02/28/2010  . DIVERTICULOSIS, COLON 02/02/2010  . ANEMIA, SEVERE 01/13/2010  . Essential hypertension 01/13/2010  . Sleep apnea 01/13/2010  . Left carotid bruit 01/13/2010  . 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 Mixed hyperlipidemia 01/13/2007   Past Medical History:  Past Medical History  Diagnosis Date  . Gastric ulcer with hemorrhage and obstruction 2011    Dr Fidela [facility]  . Anemia, unspecified   . Personal history of colonic polyps 01/30/2010    hyperplastic rectal  . Diverticulosis of colon (without mention of hemorrhage)   . Sleep apnea     on CPAP; [facility]  . Hypertension   . Hyperlipemia   . Arthritis    Past Surgical History:  Past  Surgical History  Procedure Laterality Date  . Inguinal hernia repair  1963  . Wisdom tooth extraction    . Nasal fracture surgery    . Carotid endarterectomy  11/2006    left Dr Mamie Nick  . Esophagogastroduodenoscopy Left 06/17/2013    Procedure: ESOPHAGOGASTRODUODENOSCOPY (EGD);  Surgeon: Arta Silence, MD;  Location: [facility] ENDOSCOPY;  Service: Endoscopy;  Laterality: Left;  . Endarterectomy Right 01/19/2015    Procedure: RIGHT CAROTID ENDARTERECTOMY WITH PATCH ANGIOPLASTY;  Surgeon: Mal Misty, MD;  Location: [facility];  Service: Vascular;  Laterality: Right;  . Tee without cardioversion N/A 01/17/2015    Procedure: TRANSESOPHAGEAL ECHOCARDIOGRAM (TEE);  Surgeon: Sanda Klein, MD;  Location[facility] END[facility]COPY;  Service: Cardiovascular;  Laterality: N/A;   HPI:  55 year old male with a history of gastric ulcer, GI bleed, TIA, carotid stenosis status post bilateral carotid endarterectomy, hyperlipidemia, hypertension presented with one-day history of vomiting and diarrhea and rt hemiparesis. MRI negative. CXR negative.    Assessment / Plan / Recommendation Clinical Impression  Patient presents with impairments in the areas of sustained attention, short term memory, and emergent and safety awareness. All are impacted by hearing impairment however appeared evident despite raising voice. Unclear baseline which was impacted by last TIA per patient and previous SLP notes although patient a poor historian with details of deficits and according to him, they had largely resolved. Recommend SLP f/u for diagnostic treatment to ensure that patient has proper level of care for d/c to maximize independence and f/u therapy if indicated (although states he does not want any f/u services).  SLP Assessment  Patient needs continued Speech Lanaguage Pathology Services    Follow Up Recommendations  Other (comment) (TBD)    Frequency and Duration min 2x/week  1 week      SLP Evaluation Prior Functioning   Type of Home: House Available Help at Discharge: Family;Available PRN/intermittently;Other (Comment) (sister) Vocation: Full time employment   Cognition  Overall Cognitive Status: Difficult to assess Arousal/Alertness: Awake/alert Orientation Level: Oriented X4 Attention: Sustained Sustained Attention: Impaired Sustained Attention Impairment: Verbal complex Memory: Impaired Memory Impairment: Storage deficit;Retrieval deficit (impacted by attention and HOH) Awareness: Impaired Awareness Impairment: Emergent impairment Problem Solving: Appears intact Behaviors: Impulsive;Poor frustration tolerance Safety/Judgment: Impaired Comments: "im not going to stay here more than another day"    Comprehension  Auditory Comprehension Overall Auditory Comprehension: Impaired Yes/No Questions: Within Functional Limits Commands: Impaired Multistep Basic Commands: 75-100% accurate Conversation: Simple Interfering Components: Attention;Hearing Visual Recognition/Discrimination Discrimination: Not tested Reading Comprehension Reading Status: Not tested    Expression Expression Primary Mode of Expression: Verbal Verbal Expression Overall Verbal Expression: Appears within functional limits for tasks assessed Written Expression Dominant Hand: Right   Oral / Motor  Oral Motor/Sensory Function Overall Oral Motor/Sensory Function: Within functional limits Motor Speech Overall Motor Speech: Appears within functional limits for tasks assessed   GO            Gabriel Rainwater MA, CCC-SLP [phone]         Copeland Lapier Meryl 09/19/2015, 12:08 PM

## 2015-09-19 NOTE — Progress Notes (Signed)
[facility]                                                                              Patient Demographics  Martin Archer, is a 55 y.o. male, DOB - 1961/03/04, [id number]  Admit date - 09/18/2015   Admitting Physician Orson Eva, MD  Outpatient Primary MD for the patient is [name], MD  LOS - 1   Chief Complaint  Patient presents with  . Weakness       Brief HPI  Per Dr. Carles Collet on 1/22 55 year old male with a history of gastric ulcer, GI bleed, TIA, carotid stenosis status post bilateral carotid endarterectomy, hyperlipidemia, hypertension presented with one-day history of vomiting and diarrhea. The patient is quite a difficult historian. He frequently changes his history even during the same interview. In addition, he has told different providers a different history. Even during the same interview, the patient would give a slightly different history if asked a similar question at different time and interview. Nevertheless, the general history, as best as can be discerned is that he had a one-day history of vomiting and diarrhea that began on the early morning of 09/18/2015. He stated that he had 2 episodes of emesis that was tinged with blood at home. Although he initially stated he only had 1 bowel movement, he later told me that he had 5-6 loose bowel movements on the day of admission. He denied any hematochezia, melena, fevers, chills. He does complain of lower abdominal pain. He is unable to tell me how long he has had his lower abdominal pain, but states that it is recent. Apparently, the patient developed right-sided weakness to the point he was unable to stand when he went to the bathroom in the morning of 09/18/2015. His sister heard him fall and went to check on him. She could not get him up from the floor. As a result, EMS was activated. The patient's sister stated that he was confused. Apparently, when EMS arrived the patient was conversant and alert. During  the trip to the hospital, the patient had abrupt mental status change to the point he was not following commands and not moving his right side. Code stroke was activated. The patient was seen by neurology. CT of the brain was negative. In the emergency department, the patient was afebrile and hemodynamically stable. Hemoccult was positive with brown stool. WBC was 12.8. Serum creatinine 1.31. LFTs were unremarkable.    Assessment & Plan    Principal problem Right hemiparesis: Completely resolved -  CT brain negative  -MRI brain negative for acute findings  -Patient had carotid Dopplers on 09/01/15, has a history of right carotid endarterectomy 01/19/15 and left carotid endarterectomy -2-D echo results pending -Lipid panel showed cholesterol 179, triglycerides 617, unable to calculate LDL, patient on Lipitor 80 mg daily, will need to add TriCor or fenofibrate, will follow neurology recommendations -Hemoglobin A1c pending -The patient had a similar presentation, but with left hemiparesis in May 2016--full stroke workup including TEE was unremarkable except for 85% right carotid stenosis for which he went right carotid endarterectomy  -allow for permissive hypertension with hydralazine as needed  Active problems Hematemesis /melanotic stools, acute blood loss anemia -Patient reported to admitting physician that he did not have melanotic stools however he reported to me during the encounter, that he had multiple black stools - Continue PPI, hold aspirin for now, GI consulted.   Diarrhea  -Unclear if the patient is truly having diarrhea due to his different accounts  -C. difficile negative, stool pathogen panel pending  Hypertension  -Hold amlodipine, metoprolol, losartan to allow for permissive hypertension   Hyperlipidemia  -Continue statin  - Awaiting neurology recommendations regarding second agent, patient is on max dose of Lipitor 80 mg daily   Delirium  -urine drug  screen Showed positive for opiates, THC - Patient refused EEG -TSH 1.4  CKD stage II  -Baseline creatinine 1.1-1.4   Code Status:Full code  Family Communication: Discussed in detail with the patient, all imaging results, lab results explained to the patient    Disposition Plan:   Time Spent in minutes 25 minutes  Procedures  MRI brain 2-D echo  Consults   Neurology  GI  DVT Prophylaxis SCD's  Medications  Scheduled Meds: . atorvastatin  80 mg Oral Daily  . pantoprazole (PROTONIX) IV  40 mg Intravenous Q12H   Continuous Infusions:  PRN Meds:.hydrALAZINE, senna-docusate   Antibiotics   Anti-infectives    None        Subjective:   Martin Archer was seen and examined today. Patient denies dizziness, chest pain, shortness of breath, abdominal pain, N/V/D/C, new weakness, numbess, tingling. No acute events overnight.  States right-sided weakness is resolved. No diarrhea overnight or bleeding. No hematemesis nausea or vomiting overnight  Objective:   Blood pressure 120/102, pulse 103, temperature 98.6 F (37 C), temperature source Oral, resp. rate 18, weight 84.324 kg (185 lb 14.4 oz), SpO2 99 %.  Wt Readings from Last 3 Encounters:  09/18/15 84.324 kg (185 lb 14.4 oz)  06/29/15 87.091 kg (192 lb)  03/25/15 87.454 kg (192 lb 12.8 oz)    No intake or output data in the 24 hours ending 09/19/15 1310  Exam  General: Alert and oriented x 3, NAD  HEENT:  PERRLA, EOMI, Anicteric Sclera, mucous membranes moist.   Neck: Supple, no JVD, no masses  CVS: S1 S2 auscultated, no rubs, murmurs or gallops. Regular rate and rhythm.  Respiratory: Clear to auscultation bilaterally, no wheezing, rales or rhonchi  Abdomen: Soft, nontender, nondistended, + bowel sounds  Ext: no cyanosis clubbing or edema  Neuro: AAOx3, Cr N's II- XII. Strength 5/5 upper and lower extremities bilaterally  Skin: No rashes  Psych: Normal affect and demeanor, alert and oriented x3      Data Review   Micro Results Recent Results (from the past 240 hour(s))  Urine culture     Status: None (Preliminary result)   Collection Time: 09/18/15  9:22 PM  Result Value Ref Range Status   Specimen Description URINE, RANDOM  Final   Special Requests NONE  Final   Culture NO GROWTH < 24 HOURS  Final   Report Status PENDING  Incomplete  C difficile quick scan w PCR reflex     Status: None   Collection Time: 09/18/15  9:22 PM  Result Value Ref Range Status   C Diff antigen NEGATIVE NEGATIVE Final   C Diff toxin NEGATIVE NEGATIVE Final   C Diff interpretation Negative for toxigenic C. difficile  Final    Radiology Reports Dg Chest 2 View  09/19/2015  CLINICAL DATA:  Status post CVA. Generalized weakness,  acute onset. Initial encounter. EXAM: CHEST  2 VIEW COMPARISON:  Chest radiograph performed 01/15/2015, and CTA of the chest performed 01/17/2015 FINDINGS: The lungs are well-aerated and clear. There is no evidence of focal opacification, pleural effusion or pneumothorax. The heart is normal in size; the mediastinal contour is within normal limits. No acute osseous abnormalities are seen. IMPRESSION: No acute cardiopulmonary process seen. Electronically Signed   By: Garald Balding M.D.   On: 09/19/2015 00:50   Ct Head Wo Contrast  09/18/2015  CLINICAL DATA:  Patient became unresponsive, aphasic with right-sided weakness and facial droop. EXAM: CT HEAD WITHOUT CONTRAST TECHNIQUE: Contiguous axial images were obtained from the base of the skull through the vertex without intravenous contrast. COMPARISON:  CT of the head 05/20/2015 FINDINGS: Brain: No evidence of acute infarction, hemorrhage, extra-axial collection, ventriculomegaly, or mass effect. 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 Vascular: No hyperdense vessel or unexpected calcification. Skull: Negative for fracture or focal lesion. Sinuses/Orbits: No acute findings. Other: None. IMPRESSION: No acute intracranial  abnormality. These results were called by telephone at the time of interpretation on 09/18/2015 at 3:46 pm to Dr. Carmin Muskrat , who verbally acknowledged these results. Electronically Signed   By: Fidela Salisbury M.D.   On: 09/18/2015 15:50   [name]  09/18/2015  CLINICAL DATA:  54 year old hypertensive male with hyperlipidemia complaining of aphasia and right leg weakness. Subsequent encounter. EXAM: MRI HEAD WITHOUT CONTRAST TECHNIQUE: Multiplanar, multiecho pulse sequences of the brain and surrounding structures were obtained without intravenous contrast. COMPARISON:  09/18/2015 CT.  02/12/2015 MR. FINDINGS: Exam is motion degraded. No acute infarct or intracranial hemorrhage. Chronically occluded left internal carotid artery. Increased signal within the left middle cerebral artery branch suggestive of slow flow indicating inadequate collateral supply. This was noted on prior exam although slightly more apparent on the current exam. Small caliber intracranial vasculature may indicate changes of atherosclerosis. No intracranial mass lesion noted on this unenhanced exam. No hydrocephalus. Mild exophthalmos.  Orbital structures otherwise unremarkable. Partially empty non expanded sella incidentally noted. Cervical medullary junction unremarkable. Minimal mucosal thickening ethmoid sinus air cells. IMPRESSION: Exam is motion degraded. No acute infarct or intracranial hemorrhage. Chronically occluded left internal carotid artery. Increased signal within the left middle cerebral artery branch suggestive of slow flow indicating inadequate collateral supply. This was noted on prior exam although slightly more apparent on the current exam. Small caliber intracranial vasculature may indicate changes of atherosclerosis. No intracranial mass lesion noted on this unenhanced exam. Mild exophthalmos. Electronically Signed   By: Genia Del M.D.   On: 09/18/2015 18:01    CBC  Recent Labs Lab  09/18/15 1543 09/18/15 1549 09/19/15 0906  WBC 12.8*  --  9.5  HGB 10.5* 11.2* 8.6*  HCT 30.7* 33.0* 25.3*  PLT 242  --  197  MCV 92.7  --  92.3  MCH 31.7  --  31.4  MCHC 34.2  --  34.0  RDW 12.8  --  13.0  LYMPHSABS 2.8  --   --   MONOABS 0.7  --   --   EOSABS 0.0  --   --   BASOSABS 0.0  --   --     Chemistries   Recent Labs Lab 09/18/15 1543 09/18/15 1549 09/19/15 0906  NA 139 139 141  K 4.9 4.8 4.2  CL 107 107 107  CO2 21*  --  24  GLUCOSE 136* 132* 110*  BUN 74* 78* 46*  CREATININE 1.31*  1.30* 1.28*  CALCIUM 9.0  --  8.9  AST 20  --   --   ALT 20  --   --   ALKPHOS 68  --   --   BILITOT 0.4  --   --    ------------------------------------------------------------------------------------------------------------------ estimated creatinine clearance is 68.2 mL/min (by C-G formula based on Cr of 1.28). ------------------------------------------------------------------------------------------------------------------ No results for input(s): HGBA1C in the last 72 hours. ------------------------------------------------------------------------------------------------------------------  Recent Labs  09/19/15 0906  CHOL 179  HDL 26*  LDLCALC UNABLE TO CALCULATE IF TRIGLYCERIDE OVER 400 mg/dL  TRIG 617*  CHOLHDL 6.9   ------------------------------------------------------------------------------------------------------------------  Recent Labs  09/18/15 2108  TSH 1.423   ------------------------------------------------------------------------------------------------------------------ No results for input(s): VITAMINB12, FOLATE, FERRITIN, TIBC, IRON, RETICCTPCT in the last 72 hours.  Coagulation profile  Recent Labs Lab 09/18/15 1543  INR 1.07    No results for input(s): DDIMER in the last 72 hours.  Cardiac Enzymes No results for input(s): CKMB, TROPONINI, MYOGLOBIN in the last 168 hours.  Invalid input(s):  CK ------------------------------------------------------------------------------------------------------------------ Invalid input(s): POCBNP  No results for input(s): GLUCAP in the last 72 hours.   Kenyetta Fife M.D. [facility] 09/19/2015, 1:10 PM  Pager: [phone] Between 7am to 7pm - call Pager - [phone]  After 7pm go to [email or web address] - password [email or web address]  Call night coverage person covering after 7pm

## 2015-09-19 NOTE — Consult Note (Signed)
[facility] Consult: 12:54 PM 09/19/2015  LOS: 1 day    Referring Provider: Dr Tana Coast.    Primary Care Physician:  [name], MD Primary Gastroenterologist:  Dr Sharlett Iles then Dr Fuller Plan.     Reason for Consultation:  Anemia.   HPI: Martin Archer is a 55 y.o. male.    Hx htn.  HLD.  Carotic stensois, s/p bil CEA left 2008, right 2016. EF 55 to 60% and grade 1 distolilc dysfunction on 12/2014 Echo.  Squamous cell skin cancer.  Hx NSAID gastric ulcers 09/2013 EGD to follow up gastric ulcer.  3-5 mm shallow antral ulcer at lesser curvature.  Not atypical appearing. 05/2013 EGD with Dr Paulita Fujita.  Large, clean based antral ulcer, mild pyloric stenosis, likely NSAID injury. 01/2010 EGD healing 5 mm prepuloric ulcer, dried blood, no VV or active bleeding.  Edema at pyloric channel, food in fundus. Path negative for H pylori, intestinal metaplasia.   01/2010 Colonoscopy screening study.  Mild sigmoid tics.  76mm sessile sigmoid. polyp (path: HP).  Repeat colonoscopy screening 2021. Pt on BID Protonix 40 mg and says he has not run out of med.  Patient last seen by Dr. Fuller Plan in 10/2013.  Plan was to repeat upper endoscopy 01/2014   C/o left sided weakness and on 12/2014 CT angio had occludded left carotid, 85% right carotid stenosis, non-occlusive thrombus in left external jugular as well as ? Of same in right jugular vein.  Underwent right CEA on 01/19/15.  Placed on PLavix and 325 ASA. In 01/2015 was c/o right arm and leg intermittent paralysis. Plavix was discontinued ~ 01/2015, 325 ASA continued.  Po iron stopped 02/2015 as iron studies normal.  Imaging also shows severe athero disease of aortic arch.   Anemia, Hgb 9 noted by Dr Linna Darner 01/2015.  Placed on po iron.  Pt told MD stools were dark before starting po iron so started on  PPI.  Po iron was discontinued in late 01/2015 after iron studies revealed iron level high at 286, ferritin level of 30, iron saturation high at 92.   Hemoglobin rechecked in late July was 12.7. It was 14.8 on 06/30/2015. Yesterday 1/22 it was 10.5. This morning it is 8.6. His MCV has repeatedly been in the low 90s dating back to 01/2015.  On Friday and Saturday, so starting 1/20, the patient felt fatigue and weakness, like he was "coming down with something ".  Early a.m. 1/22, Sunday he awoke with cold sweats and proceeded to pass loose, black stools several times. Later he became dizzy. He did not have any syncope. He called EMS and was transported to the emergency room.  Stool was brown and tested heme positive per emergency room physician.  No black stools have been documented since his arrival in the emergency room yesterday. He was having the right sided paresis which has been plaguing him now for many months. CT scan of the brain was negative. He is to be seen by the neurologist today but there is question as to whether he has been having TIAs.  At arrival to the emergency room patient had a BUN of 74/creatinine 1.3 this compares with BUN/creat 20/1.1 on 06/30/15. Patient's coags are normal. FOBT is positive.  C. difficile is negative and hospitalist also ordered a stool pathogen panel.  Patient says baseline bowel movements were once daily, brown, formed up until this weekend. His appetite was also within normal limits up until the weekend. Patient denies use of NSAIDs, though he is taking the full dose aspirin. 4 lumbar spine pain, he takes hydrocodone periodically as needed. 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 Patient has never had blood transfusion per his recall. He hasn't seen any blood in his stool. No abdominal or epigastric pain.    Past Medical History  Diagnosis Date  . Gastric ulcer with  hemorrhage and obstruction 2011    Dr [facility]  . Anemia, unspecified   . Personal history of colonic polyps 01/30/2010    hyperplastic rectal  . Diverticulosis of colon (without mention of hemorrhage)   . Sleep apnea     on CPAP; [facility]  . Hypertension   . Hyperlipemia   . Arthritis     Past Surgical History  Procedure Laterality Date  . Inguinal hernia repair  1963  . Wisdom tooth extraction    . Nasal fracture surgery    . Carotid endarterectomy  11/2006    left Dr Mamie Nick  . Esophagogastroduodenoscopy Left 06/17/2013    Procedure: ESOPHAGOGASTRODUODENOSCOPY (EGD);  Surgeon: Arta Silence, MD;  Location: [facility] ENDOSCOPY;  Service: Endoscopy;  Laterality: Left;  . Endarterectomy Right 01/19/2015    Procedure: RIGHT CAROTID ENDARTERECTOMY WITH PATCH ANGIOPLASTY;  Surgeon: Mal Misty, MD;  Location: [place];  Service: Vascular;  Laterality: Right;  . Tee without cardioversion N/A 01/17/2015    Procedure: TRANSESOPHAGEAL ECHOCARDIOGRAM (TEE);  Surgeon: Sanda Klein, MD;  Location: [facility] ENDOSCOPY;  Service: Cardiovascular;  Laterality: N/A;    Prior to Admission medications   Medication Sig Start Date End Date Taking? Authorizing Provider  amLODipine (NORVASC) 10 MG tablet Take 1 tablet (10 mg total) by mouth daily. 03/10/15  Yes Hendricks Limes, MD  aspirin 325 MG tablet Take 325 mg by mouth daily. Reported on 09/18/2015   Yes Historical Provider, MD  atorvastatin (LIPITOR) 80 MG tablet Take 1 tablet (80 mg total) by mouth daily. 04/28/15  Yes Hendricks Limes, MD  losartan (COZAAR) 100 MG tablet Take 1 tablet (100 mg total) by mouth daily. 12/08/14  Yes Hendricks Limes, MD  metoprolol tartrate (LOPRESSOR) 25 MG tablet TAKE 1 TABLET BY MOUTH TWICE DAILY 09/10/15  Yes Biagio Borg, MD  Multiple Vitamins-Minerals (MULTIVITAL PO) Take 1 tablet by mouth daily.    Yes Historical Provider, MD  pantoprazole (PROTONIX) 40 MG tablet Take 1 tablet (40 mg total) by mouth  2 (two) times daily. 02/08/15  Yes Ladene Artist, MD  HYDROcodone-acetaminophen (NORCO/VICODIN) 5-325 MG per tablet Take 1 tablet by mouth every 6 (six) hours as needed for moderate pain. 01/20/15   Ulyses Amor, PA-C    Scheduled Meds: . atorvastatin  80 mg Oral Daily  . pantoprazole (PROTONIX) IV  40 mg Intravenous Q12H   Infusions:   PRN Meds: hydrALAZINE, senna-docusate   Allergies as of 09/18/2015 - Review Complete 09/18/2015  Allergen Reaction Noted  . Adderall [amphetamine-dextroamphet er] Diarrhea 07/14/2013  . Prozac [fluoxetine hcl] Diarrhea 07/14/2013  .  Sertraline hcl Diarrhea 07/01/2009  . Other Diarrhea 09/18/2015    Family History  Problem Relation Age of Onset  . Diabetes Father   . Hypertension Father   . Stroke Father     in late 91s  . Heart attack Mother     in 2s  . Heart attack Brother 61  . Diabetes Paternal Grandfather   . Cancer Maternal Grandfather     unknown type  . Heart attack Maternal Grandfather 76  . Colon cancer Neg Hx   . Esophageal cancer Neg Hx   . Stomach cancer Neg Hx   . Stroke Paternal Uncle     in late 61s    Social History   Social History  . Marital Status: Legally Separated    Spouse Name: N/A  . Number of Children: N/A  . Years of Education: N/A   Occupational History  . self employeed sales    Social History Main Topics  . Smoking status: Former Smoker -- 1.00 packs/day    Types: Cigarettes    Quit date: 12/26/2014  . Smokeless tobacco: Never Used     Comment: smoked 1976-2007 & 2009- present , up to 2 ppd, average > 1ppd  . Alcohol Use: 0.0 oz/week    0 Standard drinks or equivalent per week     Comment: currently at [facility] for treatment.   . Drug Use: No  . Sexual Activity: Not on file   Other Topics Concern  . Not on file   Social History Narrative    REVIEW OF SYSTEMS: Constitutional:  Stable weight. Generally able to perform all ADLs and go to work. ENT:  No nose bleeds Pulm:  No  cough, no shortness of breath. CV:  No palpitations, no LE edema. No PND. GU:  No hematuria, no frequency GI:  Per HPI Heme:  Per HPI   Transfusions:  Per HPI Neuro:  No headaches, no peripheral tingling or numbness Derm:  No itching, no rash or sores.  Endocrine:  No sweats or chills.  No polyuria or dysuria Immunization:  Reviewed the history. Do not see a current flu vaccine but did not inquire with the patient himself as to flu vaccination status. Travel:  None beyond local counties in last few months.    PHYSICAL EXAM: Vital signs in last 24 hours: Filed Vitals:   09/19/15 0600 09/19/15 0758  BP: 122/84 120/102  Pulse: 94 103  Temp:  98.6 F (37 C)  Resp: 16 18   Wt Readings from Last 3 Encounters:  09/18/15 84.324 kg (185 lb 14.4 oz)  06/29/15 87.091 kg (192 lb)  03/25/15 87.454 kg (192 lb 12.8 oz)    General: Patient is a somewhat unwell, anxious WM appearing his stated age. He does not look acutely ill. Head:  No swelling or facial asymmetry.  Eyes:  No scleral icterus, conjunctiva is pale. Ears:  Somewhat HOH. No hearing aid in place.  Nose:  No discharge or congestion. Mouth:  Tongue is midline. Mucosa is clear, moist and pink. Dentition fair. Neck:  No JVD, no thyromegaly. No masses. Lungs:  Clear to auscultation bilaterally. No cough. No shortness of breath. Heart: RRR. No MRG. S1/S2 audible. Abdomen:  Soft. Without masses or organomegaly. No bruits. No hernias. Bowel sounds active..   Rectal: Rectal exam was not repeated. Yesterday in the emergency room the stool was brown but FOBT positive.   Musc/Skeltl: No joint swelling, erythema or gross deformity. Extremities:  No CCE.  Neurologic:  Limb strength is full and equal bilaterally in the upper and lower extremities. No tremor. He is oriented 3. Skin:  No telangiectasia, rashes, suspicious lesions or sores. Tattoos:  None seen. Nodes:  No cervical or inguinal adenopathy.   Psych:  Anxious,  cooperative.  Intake/Output from previous day:   Intake/Output this shift:    LAB RESULTS:  Recent Labs  09/18/15 1543 09/18/15 1549 09/19/15 0906  WBC 12.8*  --  9.5  HGB 10.5* 11.2* 8.6*  HCT 30.7* 33.0* 25.3*  PLT 242  --  197   BMET Lab Results  Component Value Date   NA 141 09/19/2015   NA 139 09/18/2015   NA 139 09/18/2015   K 4.2 09/19/2015   K 4.8 09/18/2015   K 4.9 09/18/2015   CL 107 09/19/2015   CL 107 09/18/2015   CL 107 09/18/2015   CO2 24 09/19/2015   CO2 21* 09/18/2015   CO2 31 06/30/2015   GLUCOSE 110* 09/19/2015   GLUCOSE 132* 09/18/2015   GLUCOSE 136* 09/18/2015   BUN 46* 09/19/2015   BUN 78* 09/18/2015   BUN 74* 09/18/2015   CREATININE 1.28* 09/19/2015   CREATININE 1.30* 09/18/2015   CREATININE 1.31* 09/18/2015   CALCIUM 8.9 09/19/2015   CALCIUM 9.0 09/18/2015   CALCIUM 9.7 06/30/2015   LFT  Recent Labs  09/18/15 1543  PROT 6.0*  ALBUMIN 3.4*  AST 20  ALT 20  ALKPHOS 68  BILITOT 0.4   PT/INR Lab Results  Component Value Date   INR 1.07 09/18/2015   INR 1.07 01/17/2015   INR 0.93 01/15/2015   Hepatitis Panel No results for input(s): HEPBSAG, HCVAB, HEPAIGM, HEPBIGM in the last 72 hours. C-Diff No components found for: CDIFF Lipase     Component Value Date/Time   LIPASE 25 09/18/2015 2108    Drugs of Abuse     Component Value Date/Time   LABOPIA POSITIVE* 09/18/2015 2122   COCAINSCRNUR NONE DETECTED 09/18/2015 2122   LABBENZ NONE DETECTED 09/18/2015 2122   AMPHETMU NONE DETECTED 09/18/2015 2122   THCU POSITIVE* 09/18/2015 2122   LABBARB NONE DETECTED 09/18/2015 2122     RADIOLOGY STUDIES: Dg Chest 2 View  09/19/2015  CLINICAL DATA:  Status post CVA. Generalized weakness, acute onset. Initial encounter. EXAM: CHEST  2 VIEW COMPARISON:  Chest radiograph performed 01/15/2015, and CTA of the chest performed 01/17/2015 FINDINGS: The lungs are well-aerated and clear. There is no evidence of focal opacification,  pleural effusion or pneumothorax. The heart is normal in size; the mediastinal contour is within normal limits. No acute osseous abnormalities are seen. IMPRESSION: No acute cardiopulmonary process seen. Electronically Signed   By: Garald Balding M.D.   On: 09/19/2015 00:50   Ct Head Wo Contrast  09/18/2015  CLINICAL DATA:  Patient became unresponsive, aphasic with right-sided weakness and facial droop. EXAM: CT HEAD WITHOUT CONTRAST TECHNIQUE: Contiguous axial images were obtained from the base of the skull through the vertex without intravenous contrast. COMPARISON:  CT of the head 05/20/2015 FINDINGS: Brain: No evidence of acute infarction, hemorrhage, extra-axial collection, ventriculomegaly, or mass effect. 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 Vascular: No hyperdense vessel or unexpected calcification. Skull: Negative for fracture or focal lesion. Sinuses/Orbits: No acute findings. Other: None. IMPRESSION: No acute intracranial abnormality. These results were called by telephone at the time of interpretation on 09/18/2015 at 3:46 pm to Dr. Carmin Muskrat , who verbally acknowledged these results. Electronically Signed   By: Linwood Dibbles.D.  On: 09/18/2015 15:50   [name]  09/18/2015  CLINICAL DATA:  55 year old hypertensive male with hyperlipidemia complaining of aphasia and right leg weakness. Subsequent encounter. EXAM: MRI HEAD WITHOUT CONTRAST TECHNIQUE: Multiplanar, multiecho pulse sequences of the brain and surrounding structures were obtained without intravenous contrast. COMPARISON:  09/18/2015 CT.  02/12/2015 MR. FINDINGS: Exam is motion degraded. No acute infarct or intracranial hemorrhage. Chronically occluded left internal carotid artery. Increased signal within the left middle cerebral artery branch suggestive of slow flow indicating inadequate collateral supply. This was noted on prior exam although slightly more apparent on the current exam. Small caliber  intracranial vasculature may indicate changes of atherosclerosis. No intracranial mass lesion noted on this unenhanced exam. No hydrocephalus. Mild exophthalmos.  Orbital structures otherwise unremarkable. Partially empty non expanded sella incidentally noted. Cervical medullary junction unremarkable. Minimal mucosal thickening ethmoid sinus air cells. IMPRESSION: Exam is motion degraded. No acute infarct or intracranial hemorrhage. Chronically occluded left internal carotid artery. Increased signal within the left middle cerebral artery branch suggestive of slow flow indicating inadequate collateral supply. This was noted on prior exam although slightly more apparent on the current exam. Small caliber intracranial vasculature may indicate changes of atherosclerosis. No intracranial mass lesion noted on this unenhanced exam. Mild exophthalmos. Electronically Signed   By: Genia Del M.D.   On: 09/18/2015 18:01    ENDOSCOPIC STUDIES: Per HPI  IMPRESSION:   *  Recurrent GI bleeding in patient with history of NSAID-induced gastric antral ulcers. Patient has been compliant with twice daily PPI but for several months has been taking full dose aspirin as therapy for cerebral/peripheral vascular disease.  Given his history as well as the sudden increase in BUN, suspect upper GI bleed   *  ABL anemia.    PLAN:     *  Needs EGD. Will arrange this for tomorrow, right now it is slated for 1:30 p.m..  Ok to eat full liquids tonight, clears in AM.  Stop enteric precautions now, C diff is negative and the frequent stools are likely  due to GI bleed, very much doubt this is an infectious process.    Azucena Freed  09/19/2015, 12:54 PM Pager: [phone]      Attending physician's note   I have taken a history, examined the patient and reviewed the chart. I agree with the Advanced Practitioner's note, impression and recommendations. Melena and ABL anemia. Suspected UGI bleed in patient with a history of  NSAID induced gastric ulcers. Monitor Hb. Continue PPI. EGD tomorrow.   Lucio Edward, MD [facility] [phone] Mon-Fri 8a-5p [phone] after 5p, weekends, holidays

## 2015-09-19 NOTE — Progress Notes (Signed)
Martin Archer has refused his EEG. Nurse will follow up with ordering physician

## 2015-09-19 NOTE — Evaluation (Signed)
Physical Therapy Evaluation Patient Details Name: Martin Archer MRN: [id number] DOB: November 19, 1960 Today's Date: 09/19/2015   History of Present Illness  55 year old male with a history of gastric ulcer, GI bleed, TIA, carotid stenosis status post bilateral carotid endarterectomy, hyperlipidemia, hypertension presented with one-day history of vomiting and diarrhea and rt hemiparesis.   Clinical Impression  Pt admitted with above diagnosis. Pt currently with functional limitations due to the deficits listed below (see PT Problem List).  Pt will benefit from skilled PT to increase their independence and safety with mobility to allow discharge to the venue listed below. Patient doing well with initial mobility, supervision with ambulation for safety. Anticipate patient will progress quickly and D/C home to his sisters home. Patient reports that his sister will be around 90% of the time if needed.        Follow Up Recommendations No PT follow up;Supervision - Intermittent    Equipment Recommendations  None recommended by PT    Recommendations for Other Services       Precautions / Restrictions Precautions Precautions: None Restrictions Weight Bearing Restrictions: No      Mobility  Bed Mobility Overal bed mobility: Modified Independent;Needs Assistance Bed Mobility: Supine to Sit     Supine to sit: Supervision (supervision for safety)     General bed mobility comments: using rails  Transfers Overall transfer level: Needs assistance Equipment used: None Transfers: Sit to/from Stand Sit to Stand: Supervision         General transfer comment: supervision for safety  Ambulation/Gait Ambulation/Gait assistance: Supervision Ambulation Distance (Feet): 40 Feet Assistive device: None Gait Pattern/deviations: WFL(Within Functional Limits) Gait velocity: WFL   General Gait Details: minimal instability with ambulation.   Stairs            Wheelchair Mobility     Modified Rankin (Stroke Patients Only)       Balance Overall balance assessment: Needs assistance Sitting-balance support: No upper extremity supported Sitting balance-Leahy Scale: Good     Standing balance support: No upper extremity supported Standing balance-Leahy Scale: Fair                               Pertinent Vitals/Pain Pain Assessment: No/denies pain    Home Living Family/patient expects to be discharged to:: Private residence Living Arrangements: Other relatives Available Help at Discharge: Family;Available PRN/intermittently;Other (Comment) (sister) Type of Home: House Home Access: Stairs to enter Entrance Stairs-Rails: Right Entrance Stairs-Number of Steps: 2-3 Home Layout: One level Home Equipment: None      Prior Function Level of Independence: Independent               Hand Dominance        Extremity/Trunk Assessment   Upper Extremity Assessment: Overall WFL for tasks assessed           Lower Extremity Assessment: Overall WFL for tasks assessed         Communication   Communication: No difficulties  Cognition Arousal/Alertness: Awake/alert Behavior During Therapy: WFL for tasks assessed/performed Overall Cognitive Status: Within Functional Limits for tasks assessed                      General Comments      Exercises        Assessment/Plan    PT Assessment    PT Diagnosis Difficulty walking   PT Problem List    PT Treatment Interventions  PT Goals (Current goals can be found in the Care Plan section) Acute Rehab PT Goals Patient Stated Goal: get this figured out and go home PT Goal Formulation: With patient Time For Goal Achievement: 10/03/15 Potential to Achieve Goals: Good    Frequency     Barriers to discharge        Co-evaluation               End of Session Equipment Utilized During Treatment: Gait belt Activity Tolerance: Patient tolerated treatment well Patient  left: in chair;with call bell/phone within reach;with chair alarm set Nurse Communication: Mobility status         Time: [id number] PT Time Calculation (min) (ACUTE ONLY): 25 min   Charges:   PT Evaluation $PT Eval Moderate Complexity: 1 Procedure PT Treatments $Gait Training: 8-22 mins   PT G Codes:        Cassell Clement, PT, CSCS Pager [phone] Office [phone]  09/19/2015, 11:32 AM

## 2015-09-19 NOTE — Progress Notes (Signed)
STROKE TEAM PROGRESS NOTE   HISTORY OF PRESENT ILLNESS Martin Archer is an 55 y.o. male with a past medical history significant for HTN, HLD, OSA, gastric ulcer with GI bleed, who initially came to the ED due to weakness, vomiting, vertigo, and dark stools. EMS stated he gave a long list of symptoms in route. While in the ED he was reportedly aphasic and not moving his right leg which prompted code stroke activation. However, his deficits virtually went away after returning from CT. Nurse mentioned that patient was not speaking on her initial assessment but " saying random words like "super" repeatedly, but not answering any questions, aphasic, crying and laughing intermittently". CT head showed no acute abnormality. Denies HA, vertigo, double vision, visual disturbances, focal weakness or numbness. He was last known well 09/18/15 at 3:15 pm. NIHSS: 5. MRS: 0. Patient was not administered TPA secondary to symptoms resolved. He was admitted for further evaluation and treatment.   SUBJECTIVE (INTERVAL HISTORY) No family is at the bedside.  He is sitting up in the chair. Overall he feels his condition is stable. He is aggravated. Wants to have his carotid open. Does not understand why we do not know when the occlusion occurred. Asking to have it opened.   OBJECTIVE Temp:  [98.2 F (36.8 C)-99.1 F (37.3 C)] 98.6 F (37 C) (01/23 0758) Pulse Rate:  [93-122] 103 (01/23 0758) Cardiac Rhythm:  [-] Normal sinus rhythm (01/23 0726) Resp:  [15-23] 18 (01/23 0758) BP: (107-158)/(65-113) 120/102 mmHg (01/23 0758) SpO2:  [93 %-100 %] 99 % (01/23 0758) Weight:  [84.324 kg (185 lb 14.4 oz)] 84.324 kg (185 lb 14.4 oz) (01/22 2036)  CBC:   Recent Labs Lab 09/18/15 1543 09/18/15 1549 09/19/15 0906  WBC 12.8*  --  9.5  NEUTROABS 9.2*  --   --   HGB 10.5* 11.2* 8.6*  HCT 30.7* 33.0* 25.3*  MCV 92.7  --  92.3  PLT 242  --  [id number]    Basic Metabolic Panel:   Recent Labs Lab 09/18/15 1543  09/18/15 1549 09/19/15 0906  NA 139 139 141  K 4.9 4.8 4.2  CL 107 107 107  CO2 21*  --  24  GLUCOSE 136* 132* 110*  BUN 74* 78* 46*  CREATININE 1.31* 1.30* 1.28*  CALCIUM 9.0  --  8.9    Lipid Panel:     Component Value Date/Time   CHOL 179 09/19/2015 0906   CHOL 203* 03/25/2015 0938   CHOL 286* 10/12/2013 0436   TRIG 617* 09/19/2015 0906   TRIG 572* 10/12/2013 0436   TRIG 284* 07/15/2006 1158   HDL 26* 09/19/2015 0906   HDL 52 03/25/2015 0938   HDL 35* 10/12/2013 0436   CHOLHDL 6.9 09/19/2015 0906   CHOLHDL 3.9 03/25/2015 0938   CHOLHDL 5.9 CALC 07/15/2006 1158   VLDL UNABLE TO CALCULATE IF TRIGLYCERIDE OVER 400 mg/dL 09/19/2015 0906   LDLCALC UNABLE TO CALCULATE IF TRIGLYCERIDE OVER 400 mg/dL 09/19/2015 0906   LDLCALC 83 03/25/2015 0938   LDLCALC NOT CALC 10/12/2013 0436   HgbA1c:  Lab Results  Component Value Date   HGBA1C 5.6 01/16/2015   Urine Drug Screen:     Component Value Date/Time   LABOPIA POSITIVE* 09/18/2015 2122   COCAINSCRNUR NONE DETECTED 09/18/2015 2122   LABBENZ NONE DETECTED 09/18/2015 2122   AMPHETMU NONE DETECTED 09/18/2015 2122   THCU POSITIVE* 09/18/2015 2122   LABBARB NONE DETECTED 09/18/2015 2122      IMAGING  Dg  Chest 2 View 09/19/2015  No acute cardiopulmonary process seen.   Ct Head Wo Contrast 09/18/2015   No acute intracranial abnormality.   [name] 09/18/2015   Exam is motion degraded. No acute infarct or intracranial hemorrhage. Chronically occluded left internal carotid artery. Increased signal within the left middle cerebral artery branch suggestive of slow flow indicating inadequate collateral supply. This was noted on prior exam although slightly more apparent on the current exam. Small caliber intracranial vasculature may indicate changes of atherosclerosis. No intracranial mass lesion noted on this unenhanced exam. Mild exophthalmos.    PHYSICAL EXAM Pleasant middle-aged Caucasian male currently not in  distress. . Afebrile. Head is nontraumatic. Neck is supple without bruit.    Cardiac exam no murmur or gallop. Lungs are clear to auscultation. Distal pulses are well felt. Neurological Exam ;  Awake  Alert oriented x 3. Normal speech and language.eye movements full without nystagmus.fundi were not visualized. Vision acuity and fields appear normal. Hearing is normal. Palatal movements are normal. Face symmetric. Tongue midline. Normal strength, tone, reflexes and coordination. Normal sensation. Gait deferred. ASSESSMENT/PLAN [name] is a 55 y.o. male with history of HTN, HLD, OSA, gastric ulcer with GI bleed presenting with weakness, vomiting, vertigo and dark stools who developed reported aphasia and hemiparesis of his right leg in the ED. He did not receive IV t-PA due to resolved symptoms.  Right hemiparesis due to hypoperfusion in setting of occluded left ICA  Resultant  Recurrent R HP  MRI  No acute stroke  Recent EEG normal  Carotid Doppler  09/01/2015 L ICA occluded, R open  2D Echo  pending   LDL unable to calculate  HgbA1c pending  SCDs for VTE prophylaxis Diet NPO time specified Except for: Sips with Meds  aspirin 325 mg daily prior to admission,  aspirin 325 mg daily now on hold  Ongoing aggressive stroke risk factor management  Therapy recommendations:  No PT needs  Disposition:  Return home  Hypertension  Stable  Hyperlipidemia  Home meds:  Lipitor 80, resumed in hospital  LDL unable to calculate, goal < 70  Continue statin at discharge  Carotid stenosis  Known left CCA occlusion  Status post right CEA May 2016, open during last carotid done this month  Other Stroke Risk Factors  Former Cigarette smoker, quit smoking 8 months ago, advised to stop smoking  ETOH use  Urine drug screen positive for opiates THC again this admission like May 2016  Hx stroke/TIA   May 2016 - right brain TIA, TEE with ulcerated plaque in aortic arch,  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 I have made any additions or clarifications directly to the above note. Agree with note above.  She presented with the packing GI bleed and the ER developed aphasia and right leg weakness likely due to a left brain TIA of hemodynamic etiology due to known chronic left carotid occlusion and hypotension and hypovolemia from acute GI hemorrhage. He remains at  risk for neurological worsening, recurrent stroke, TIA needs ongoing evaluation and aggressive risk factor modification. Recommend maximize volume status and avoid hypotension. He was also counseled to quit marijuana and smoking Antony Contras, MD Medical Director [place] Pager: [phone] 09/19/2015 4:22 PM   To contact Stroke Continuity provider, please refer to [email or web address] After hours, contact General Neurology

## 2015-09-20 ENCOUNTER — Encounter (HOSPITAL_COMMUNITY): Payer: Self-pay | Admitting: *Deleted

## 2015-09-20 ENCOUNTER — Encounter (HOSPITAL_COMMUNITY)
Admission: EM | Disposition: A | Discharge status: Home / Self Care | Payer: Self-pay | Source: Home / Self Care | Attending: Emergency Medicine

## 2015-09-20 DIAGNOSIS — K921 Melena: Secondary | ICD-10-CM | POA: Diagnosis not present

## 2015-09-20 DIAGNOSIS — K254 Chronic or unspecified gastric ulcer with hemorrhage: Secondary | ICD-10-CM | POA: Insufficient documentation

## 2015-09-20 DIAGNOSIS — I10 Essential (primary) hypertension: Secondary | ICD-10-CM | POA: Diagnosis not present

## 2015-09-20 DIAGNOSIS — K250 Acute gastric ulcer with hemorrhage: Secondary | ICD-10-CM | POA: Diagnosis not present

## 2015-09-20 DIAGNOSIS — K920 Hematemesis: Secondary | ICD-10-CM

## 2015-09-20 DIAGNOSIS — D500 Iron deficiency anemia secondary to blood loss (chronic): Secondary | ICD-10-CM | POA: Diagnosis not present

## 2015-09-20 DIAGNOSIS — E785 Hyperlipidemia, unspecified: Secondary | ICD-10-CM | POA: Diagnosis not present

## 2015-09-20 DIAGNOSIS — G8191 Hemiplegia, unspecified affecting right dominant side: Secondary | ICD-10-CM | POA: Diagnosis not present

## 2015-09-20 HISTORY — PX: ESOPHAGOGASTRODUODENOSCOPY: SHX5428

## 2015-09-20 LAB — BASIC METABOLIC PANEL WITH GFR
Anion gap: 5 (ref 5–15)
BUN: 26 mg/dL — ABNORMAL HIGH (ref 6–20)
CO2: 26 mmol/L (ref 22–32)
Calcium: 8.7 mg/dL — ABNORMAL LOW (ref 8.9–10.3)
Chloride: 107 mmol/L (ref 101–111)
Creatinine, Ser: 1.25 mg/dL — ABNORMAL HIGH (ref 0.61–1.24)
GFR calc Af Amer: >60 mL/min
GFR calc non Af Amer: >60 mL/min
Glucose, Bld: 98 mg/dL (ref 65–99)
Potassium: 3.8 mmol/L (ref 3.5–5.1)
Sodium: 138 mmol/L (ref 135–145)

## 2015-09-20 LAB — CBC
HEMATOCRIT: 22.8 % — AB (ref 39.0–52.0)
HEMOGLOBIN: 7.8 g/dL — AB (ref 13.0–17.0)
MCH: 31.5 pg (ref 26.0–34.0)
MCHC: 34.2 g/dL (ref 30.0–36.0)
MCV: 91.9 fL (ref 78.0–100.0)
Platelets: 206 10*3/uL (ref 150–400)
RBC: 2.48 MIL/uL — ABNORMAL LOW (ref 4.22–5.81)
RDW: 13.1 % (ref 11.5–15.5)
WBC: 9.4 10*3/uL (ref 4.0–10.5)

## 2015-09-20 LAB — HEMOGLOBIN AND HEMATOCRIT, BLOOD
HCT: 23.6 % — ABNORMAL LOW (ref 39.0–52.0)
HCT: 22.6 % — ABNORMAL LOW (ref 39.0–52.0)
Hemoglobin: 7.9 g/dL — ABNORMAL LOW (ref 13.0–17.0)
Hemoglobin: 7.7 g/dL — ABNORMAL LOW (ref 13.0–17.0)

## 2015-09-20 LAB — HEMOGLOBIN A1C
HEMOGLOBIN A1C: 5.5 % (ref 4.8–5.6)
MEAN PLASMA GLUCOSE: 111 mg/dL

## 2015-09-20 SURGERY — EGD (ESOPHAGOGASTRODUODENOSCOPY)
Anesthesia: Moderate Sedation

## 2015-09-20 MED ORDER — FENTANYL CITRATE (PF) 100 MCG/2ML IJ SOLN
INTRAMUSCULAR | Status: AC
  Filled 2015-09-20: qty 4

## 2015-09-20 MED ORDER — SODIUM CHLORIDE 0.9 % IV SOLN
INTRAVENOUS | Status: DC
  Administered 2015-09-20: 10:00:00 via INTRAVENOUS

## 2015-09-20 MED ORDER — DIPHENHYDRAMINE HCL 50 MG/ML IJ SOLN
INTRAMUSCULAR | Status: DC | PRN
  Administered 2015-09-20: 25 mg via INTRAVENOUS

## 2015-09-20 MED ORDER — MIDAZOLAM HCL 10 MG/2ML IJ SOLN
INTRAMUSCULAR | Status: DC | PRN
  Administered 2015-09-20 (×2): 2 mg via INTRAVENOUS
  Administered 2015-09-20: 1 mg via INTRAVENOUS
  Administered 2015-09-20: 2 mg via INTRAVENOUS

## 2015-09-20 MED ORDER — PANTOPRAZOLE SODIUM 40 MG PO TBEC
40.0000 mg | DELAYED_RELEASE_TABLET | Freq: Two times a day (BID) | ORAL | Status: DC
  Administered 2015-09-20 – 2015-09-21 (×2): 40 mg via ORAL
  Filled 2015-09-20 (×4): qty 1

## 2015-09-20 MED ORDER — DIPHENHYDRAMINE HCL 50 MG/ML IJ SOLN
INTRAMUSCULAR | Status: AC
  Filled 2015-09-20: qty 1

## 2015-09-20 MED ORDER — SODIUM CHLORIDE 0.9 % IV SOLN: INTRAVENOUS | Status: DC

## 2015-09-20 MED ORDER — MIDAZOLAM HCL 5 MG/ML IJ SOLN
INTRAMUSCULAR | Status: AC
  Filled 2015-09-20: qty 2

## 2015-09-20 MED ORDER — FENTANYL CITRATE (PF) 100 MCG/2ML IJ SOLN
INTRAMUSCULAR | Status: DC | PRN
  Administered 2015-09-20 (×3): 25 ug via INTRAVENOUS

## 2015-09-20 NOTE — Interval H&P Note (Signed)
History and Physical Interval Note:  09/20/2015 1:47 PM  Martin Archer  has presented today for surgery, with the diagnosis of GI bleed, anemia  The various methods of treatment have been discussed with the patient and family. After consideration of risks, benefits and other options for treatment, the patient has consented to  Procedure(s): ESOPHAGOGASTRODUODENOSCOPY (EGD) (N/A) as a surgical intervention .  The patient's history has been reviewed, patient examined, no change in status, stable for surgery.  I have reviewed the patient's chart and labs.  Questions were answered to the patient's satisfaction.     Pricilla Riffle. [place] Plan

## 2015-09-20 NOTE — Op Note (Signed)
[facility] [place], [id number]   ENDOSCOPY PROCEDURE REPORT  PATIENT: [name], [name]  MR#: [id number] BIRTHDATE: 10-06-1960 , 75  yrs. old GENDER: male ENDOSCOPIST: Ladene Artist, MD, [facility] REFERRED BY:  [facility] PROCEDURE DATE:  09/20/2015 PROCEDURE:  EGD, diagnostic ASA CLASS:     Class III INDICATIONS:  melena and hemocult positive stool. MEDICATIONS: Benadryl 25 mg IV, Fentanyl 75 mcg IV, Versed 7.5 mg IV, and Moderate sedation started at 1426 and ended at 1446. TOPICAL ANESTHETIC: Cetacaine Spray DESCRIPTION OF PROCEDURE: After the risks benefits and alternatives of the procedure were thoroughly explained, informed consent was obtained.  The PENTAX GASTOROSCOPE [id number] endoscope was introduced through the mouth and advanced to the second portion of the duodenum , Without limitations.  The instrument was slowly withdrawn as the mucosa was fully examined.    STOMACH: A single non-bleeding, clean-based, shallow and round ulcer measuring 6 x 75mm in size was found in the gastric antrum at the 3 o'clock position.   The stomach otherwise appeared normal. ESOPHAGUS: The mucosa of the esophagus appeared normal. DUODENUM: The duodenal mucosa showed no abnormalities.  Retroflexed views revealed no abnormalities.     The scope was then withdrawn from the patient and the procedure completed.  COMPLICATIONS: There were no immediate complications.  ENDOSCOPIC IMPRESSION: 1.   Single ulcer in the gastric antrum 2.   The EGD otherwise appeared normal  RECOMMENDATIONS: 1.  Continue PPI bid long term 2.  Ideally would like to avoid all ASA/NSAID products however if ASA daily is indicated would use enteric coated ASA at the lowest possible dose and strictly avoid any additional ASA or NSAID products long term. 3.  Repeat EGD in 3 months to assess for ulcer healing. GI signing off.  eSigned:  Ladene Artist, MD, [facility] 09/20/2015  2:52 PM

## 2015-09-20 NOTE — Progress Notes (Signed)
Pt resting in bed with eyes closed. Call light within reach. Bed in lowest position.

## 2015-09-20 NOTE — Progress Notes (Signed)
Nurse called to state that patient has refused EEG again; pt states he will do EEG as an outpatient but not in the hospital.

## 2015-09-20 NOTE — Progress Notes (Signed)
Hourly rounding. Pt resting in bed with eyes closed

## 2015-09-20 NOTE — Progress Notes (Signed)
Physical Therapy Treatment Patient Details Name: Martin Archer MRN: [id number] DOB: 18-Aug-1961 Today's Date: 09/20/2015    History of Present Illness 55 year old male with a history of gastric ulcer, GI bleed, TIA, carotid stenosis status post bilateral carotid endarterectomy, hyperlipidemia, hypertension presented with one-day history of vomiting and diarrhea and rt hemiparesis.     PT Comments    Patient continues to make great progress. No LOB noted with ambulation. Encouraged to ambulate several times a day. Will have assistance from his sisters at home. Patient safe to D/C from a mobility standpoint based on progression towards goals set on PT eval.    Follow Up Recommendations  No PT follow up;Supervision - Intermittent     Equipment Recommendations  None recommended by PT    Recommendations for Other Services       Precautions / Restrictions Precautions Precautions: None    Mobility  Bed Mobility Overal bed mobility: Modified Independent                Transfers Overall transfer level: Modified independent                  Ambulation/Gait Ambulation/Gait assistance: Supervision Ambulation Distance (Feet): 400 Feet Assistive device: None   Gait velocity: WFL   General Gait Details: Guarded gait. No LOB.    Stairs            Wheelchair Mobility    Modified Rankin (Stroke Patients Only)       Balance                                    Cognition Arousal/Alertness: Awake/alert Behavior During Therapy: WFL for tasks assessed/performed Overall Cognitive Status: Within Functional Limits for tasks assessed (slow to respond)                      Exercises      General Comments        Pertinent Vitals/Pain Pain Assessment: No/denies pain    Home Living                      Prior Function            PT Goals (current goals can now be found in the care plan section) Progress towards PT  goals: Progressing toward goals    Frequency       PT Plan Current plan remains appropriate    Co-evaluation             End of Session   Activity Tolerance: Patient tolerated treatment well Patient left: in bed;with call bell/phone within reach     Time: 1000-1020 PT Time Calculation (min) (ACUTE ONLY): 20 min  Charges:  $Gait Training: 8-22 mins                    G Codes:      Jacqualyn Posey 09/20/2015, 10:30 AM  09/20/2015 Robinette, Tonia Brooms PTA

## 2015-09-20 NOTE — Care Management Note (Signed)
Case Management Note  Patient Details  Name: Martin Archer MRN: [id number] Date of Birth: [birth year]  Subjective/Objective:                    Action/Plan: Patient was admitted with R- hemiparesis, vomiting, diarrhea. Lives at home with relatives.  Will follow for discharge needs.  Expected Discharge Date:                  Expected Discharge Plan:  Home/Self Care  In-House Referral:     Discharge planning Services     Post Acute Care Choice:    Choice offered to:     DME Arranged:    DME Agency:     HH Arranged:    HH Agency:     Status of Service:  In process, will continue to follow  Medicare Important Message Given:    Date Medicare IM Given:    Medicare IM give by:    Date Additional Medicare IM Given:    Additional Medicare Important Message give by:     If discussed at [place] of Stay Meetings, dates discussed:    Additional CommentsRolm Baptise, RN 09/20/2015, 10:07 AM [phone]

## 2015-09-20 NOTE — Progress Notes (Signed)
Pt back from endo, given sprite and vanilla ice cream. Dietary called and requested meal tray be brought up early per pt request.

## 2015-09-20 NOTE — Progress Notes (Signed)
[facility]                                                                              Patient Demographics  Martin Archer, is a 55 y.o. male, DOB - [birth year], [id number]  Admit date - 09/18/2015   Admitting Physician Orson Eva, MD  Outpatient Primary MD for the patient is [name], MD  LOS - 2   Chief Complaint  Patient presents with  . Weakness       Brief HPI  Per Dr. Carles Collet on 1/22 55 year old male with a history of gastric ulcer, GI bleed, TIA, carotid stenosis status post bilateral carotid endarterectomy, hyperlipidemia, hypertension presented with one-day history of vomiting and diarrhea. The patient is quite a difficult historian. He frequently changes his history even during the same interview. In addition, he has told different providers a different history. Even during the same interview, the patient would give a slightly different history if asked a similar question at different time and interview. Nevertheless, the general history, as best as can be discerned is that he had a one-day history of vomiting and diarrhea that began on the early morning of 09/18/2015. He stated that he had 2 episodes of emesis that was tinged with blood at home. Although he initially stated he only had 1 bowel movement, he later told me that he had 5-6 loose bowel movements on the day of admission. He denied any hematochezia, melena, fevers, chills. He does complain of lower abdominal pain. He is unable to tell me how long he has had his lower abdominal pain, but states that it is recent. Apparently, the patient developed right-sided weakness to the point he was unable to stand when he went to the bathroom in the morning of 09/18/2015. His sister heard him fall and went to check on him. She could not get him up from the floor. As a result, EMS was activated. The patient's sister stated that he was confused. Apparently, when EMS arrived the patient was conversant and alert. During  the trip to the hospital, the patient had abrupt mental status change to the point he was not following commands and not moving his right side. Code stroke was activated. The patient was seen by neurology. CT of the brain was negative. In the emergency department, the patient was afebrile and hemodynamically stable. Hemoccult was positive with brown stool. WBC was 12.8. Serum creatinine 1.31. LFTs were unremarkable.    Assessment & Plan    Principal problem Right hemiparesis: Symptoms Completely resolved, possibly TIA -  CT brain negative  - MRI brain negative for acute findings  -Patient had carotid Dopplers on 09/01/15, has a history of right carotid endarterectomy 01/19/15 and left carotid endarterectomy -2-D echo showed systolic function normal, grade 1 diastolic dysfunction -Lipid panel showed cholesterol 179, triglycerides 617, unable to calculate LDL, patient on Lipitor 80 mg daily, will need to add TriCor or fenofibrate, will follow neurology recommendations.  -Hemoglobin A1c 5.5 -The patient had a similar presentation, but with left hemiparesis in May 2016--full stroke workup including TEE was unremarkable except for 85% right carotid stenosis for which he went right carotid  endarterectomy  -allow for permissive hypertension with hydralazine as needed   Active problems Hematemesis /melanotic stools, acute blood loss anemia -- Continue PPI, hold aspirin for now, GI consulted.  - H&H every 8 hours, EGD today - Hemoglobin down to 7.8 this morning, may need a transfusion, await next H&H  Diarrhea  -Unclear if the patient is truly having diarrhea due to his different accounts  -C. difficile negative, stool pathogen panel pending  Hypertension  -Hold amlodipine, metoprolol, losartan to allow for permissive hypertension   Hyperlipidemia  -Continue statin  - Awaiting neurology recommendations regarding second agent, patient is on max dose of Lipitor 80 mg daily   Delirium   -urine drug screen Showed positive for opiates, THC - Patient refused EEG -TSH 1.4  CKD stage II  -Baseline creatinine 1.1-1.4   Code Status:Full code  Family Communication: Discussed in detail with the patient, all imaging results, lab results explained to the patient    Disposition Plan:   Time Spent in minutes 25 minutes  Procedures  MRI brain 2-D echo  Consults   Neurology  GI  DVT Prophylaxis SCD's  Medications  Scheduled Meds: . atorvastatin  80 mg Oral Daily  . pantoprazole (PROTONIX) IV  40 mg Intravenous Q12H   Continuous Infusions: . sodium chloride 75 mL/hr at 09/20/15 0937   PRN Meds:.hydrALAZINE, senna-docusate   Antibiotics   Anti-infectives    None        Subjective:   [name] was seen and examined today. Right-sided weakness resolved. Patient denies dizziness, chest pain, shortness of breath, abdominal pain, N/V/D/C, new weakness, numbess, tingling. BM this morning, per patient no melena. No nausea, vomiting or hematemesis overnight.  Objective:   Blood pressure 144/86, pulse 81, temperature 98.4 F (36.9 C), temperature source Oral, resp. rate 18, weight 84.324 kg (185 lb 14.4 oz), SpO2 99 %.  Wt Readings from Last 3 Encounters:  09/18/15 84.324 kg (185 lb 14.4 oz)  06/29/15 87.091 kg (192 lb)  03/25/15 87.454 kg (192 lb 12.8 oz)    No intake or output data in the 24 hours ending 09/20/15 1230  Exam  General: Alert and oriented x 3, NAD  HEENT:  PERRLA, EOMI  Neck: Supple, no JVD, no masses  CVS: S1 S2 clear. RRR  Respiratory: CTAB  Abdomen: Soft, nontender, nondistended, + bowel sounds  Ext: no cyanosis clubbing or edema  Neuro: AAOx3, Cr N's II- XII. Strength 5/5 upper and lower extremities bilaterally  Skin: No rashes  Psych: Normal affect and demeanor, alert and oriented x3    Data Review   Micro Results Recent Results (from the past 240 hour(s))  Urine culture     Status: None   Collection  Time: 09/18/15  9:22 PM  Result Value Ref Range Status   Specimen Description URINE, RANDOM  Final   Special Requests NONE  Final   Culture NO GROWTH 1 DAY  Final   Report Status 09/19/2015 FINAL  Final  Gastrointestinal Panel by PCR , Stool     Status: None   Collection Time: 09/18/15  9:22 PM  Result Value Ref Range Status   Campylobacter species NOT DETECTED NOT DETECTED Final   Plesimonas shigelloides NOT DETECTED NOT DETECTED Final   Salmonella species NOT DETECTED NOT DETECTED Final   Yersinia enterocolitica NOT DETECTED NOT DETECTED Final   Vibrio species NOT DETECTED NOT DETECTED Final   Vibrio cholerae NOT DETECTED NOT DETECTED Final   Enteroaggregative E coli (EAEC) NOT DETECTED NOT  DETECTED Final   Enteropathogenic E coli (EPEC) NOT DETECTED NOT DETECTED Final   Enterotoxigenic E coli (ETEC) NOT DETECTED NOT DETECTED Final   Shiga like toxin producing E coli (STEC) NOT DETECTED NOT DETECTED Final   E. coli O157 NOT DETECTED NOT DETECTED Final   Shigella/Enteroinvasive E coli (EIEC) NOT DETECTED NOT DETECTED Final   Cryptosporidium NOT DETECTED NOT DETECTED Final   Cyclospora cayetanensis NOT DETECTED NOT DETECTED Final   Entamoeba histolytica NOT DETECTED NOT DETECTED Final   Giardia lamblia NOT DETECTED NOT DETECTED Final   Adenovirus F40/41 NOT DETECTED NOT DETECTED Final   Astrovirus NOT DETECTED NOT DETECTED Final   Norovirus GI/GII NOT DETECTED NOT DETECTED Final   Rotavirus A NOT DETECTED NOT DETECTED Final   Sapovirus (I, II, IV, and V) NOT DETECTED NOT DETECTED Final  C difficile quick scan w PCR reflex     Status: None   Collection Time: 09/18/15  9:22 PM  Result Value Ref Range Status   C Diff antigen NEGATIVE NEGATIVE Final   C Diff toxin NEGATIVE NEGATIVE Final   C Diff interpretation Negative for toxigenic C. difficile  Final    Radiology Reports Dg Chest 2 View  09/19/2015  CLINICAL DATA:  Status post CVA. Generalized weakness, acute onset. Initial  encounter. EXAM: CHEST  2 VIEW COMPARISON:  Chest radiograph performed 01/15/2015, and CTA of the chest performed 01/17/2015 FINDINGS: The lungs are well-aerated and clear. There is no evidence of focal opacification, pleural effusion or pneumothorax. The heart is normal in size; the mediastinal contour is within normal limits. No acute osseous abnormalities are seen. IMPRESSION: No acute cardiopulmonary process seen. Electronically Signed   By: Garald Balding M.D.   On: 09/19/2015 00:50   Ct Head Wo Contrast  09/18/2015  CLINICAL DATA:  Patient became unresponsive, aphasic with right-sided weakness and facial droop. EXAM: CT HEAD WITHOUT CONTRAST TECHNIQUE: Contiguous axial images were obtained from the base of the skull through the vertex without intravenous contrast. COMPARISON:  CT of the head 05/20/2015 FINDINGS: Brain: No evidence of acute infarction, hemorrhage, extra-axial collection, ventriculomegaly, or mass effect. 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 Vascular: No hyperdense vessel or unexpected calcification. Skull: Negative for fracture or focal lesion. Sinuses/Orbits: No acute findings. Other: None. IMPRESSION: No acute intracranial abnormality. These results were called by telephone at the time of interpretation on 09/18/2015 at 3:46 pm to Dr. Carmin Muskrat , who verbally acknowledged these results. Electronically Signed   By: Fidela Salisbury M.D.   On: 09/18/2015 15:50   [name]  09/18/2015  CLINICAL DATA:  55 year old hypertensive male with hyperlipidemia complaining of aphasia and right leg weakness. Subsequent encounter. EXAM: MRI HEAD WITHOUT CONTRAST TECHNIQUE: Multiplanar, multiecho pulse sequences of the brain and surrounding structures were obtained without intravenous contrast. COMPARISON:  09/18/2015 CT.  02/12/2015 MR. FINDINGS: Exam is motion degraded. No acute infarct or intracranial hemorrhage. Chronically occluded left internal carotid artery.  Increased signal within the left middle cerebral artery branch suggestive of slow flow indicating inadequate collateral supply. This was noted on prior exam although slightly more apparent on the current exam. Small caliber intracranial vasculature may indicate changes of atherosclerosis. No intracranial mass lesion noted on this unenhanced exam. No hydrocephalus. Mild exophthalmos.  Orbital structures otherwise unremarkable. Partially empty non expanded sella incidentally noted. Cervical medullary junction unremarkable. Minimal mucosal thickening ethmoid sinus air cells. IMPRESSION: Exam is motion degraded. No acute infarct or intracranial hemorrhage. Chronically occluded left internal carotid  artery. Increased signal within the left middle cerebral artery branch suggestive of slow flow indicating inadequate collateral supply. This was noted on prior exam although slightly more apparent on the current exam. Small caliber intracranial vasculature may indicate changes of atherosclerosis. No intracranial mass lesion noted on this unenhanced exam. Mild exophthalmos. Electronically Signed   By: Genia Del M.D.   On: 09/18/2015 18:01    CBC  Recent Labs Lab 09/18/15 1543 09/18/15 1549 09/19/15 0906 09/20/15 0535  WBC 12.8*  --  9.5 9.4  HGB 10.5* 11.2* 8.6* 7.8*  HCT 30.7* 33.0* 25.3* 22.8*  PLT 242  --  197 206  MCV 92.7  --  92.3 91.9  MCH 31.7  --  31.4 31.5  MCHC 34.2  --  34.0 34.2  RDW 12.8  --  13.0 13.1  LYMPHSABS 2.8  --   --   --   MONOABS 0.7  --   --   --   EOSABS 0.0  --   --   --   BASOSABS 0.0  --   --   --     Chemistries   Recent Labs Lab 09/18/15 1543 09/18/15 1549 09/19/15 0906 09/20/15 0535  NA 139 139 141 138  K 4.9 4.8 4.2 3.8  CL 107 107 107 107  CO2 21*  --  24 26  GLUCOSE 136* 132* 110* 98  BUN 74* 78* 46* 26*  CREATININE 1.31* 1.30* 1.28* 1.25*  CALCIUM 9.0  --  8.9 8.7*  AST 20  --   --   --   ALT 20  --   --   --   ALKPHOS 68  --   --   --     BILITOT 0.4  --   --   --    ------------------------------------------------------------------------------------------------------------------ estimated creatinine clearance is 69.8 mL/min (by C-G formula based on Cr of 1.25). ------------------------------------------------------------------------------------------------------------------  Recent Labs  09/19/15 0906  HGBA1C 5.5   ------------------------------------------------------------------------------------------------------------------  Recent Labs  09/19/15 0906  CHOL 179  HDL 26*  LDLCALC UNABLE TO CALCULATE IF TRIGLYCERIDE OVER 400 mg/dL  TRIG 617*  CHOLHDL 6.9   ------------------------------------------------------------------------------------------------------------------  Recent Labs  09/18/15 2108  TSH 1.423   ------------------------------------------------------------------------------------------------------------------ No results for input(s): VITAMINB12, FOLATE, FERRITIN, TIBC, IRON, RETICCTPCT in the last 72 hours.  Coagulation profile  Recent Labs Lab 09/18/15 1543  INR 1.07    No results for input(s): DDIMER in the last 72 hours.  Cardiac Enzymes No results for input(s): CKMB, TROPONINI, MYOGLOBIN in the last 168 hours.  Invalid input(s): CK ------------------------------------------------------------------------------------------------------------------ Invalid input(s): POCBNP  No results for input(s): GLUCAP in the last 72 hours.   RAI,RIPUDEEP M.D. [facility] 09/20/2015, 12:30 PM  Pager: [phone] Between 7am to 7pm - call Pager - [phone]  After 7pm go to [email or web address] - password [email or web address]  Call night coverage person covering after 7pm

## 2015-09-20 NOTE — Progress Notes (Signed)
Daily Rounding Note  09/20/2015, 8:30 AM  LOS: 2 days   SUBJECTIVE:       Stool formed and brown per pt, last was ~8 AM today.  Feels well.  No speech issues or limb weakness.   OBJECTIVE:         Vital signs in last 24 hours:    Temp:  [98 F (36.7 C)-98.6 F (37 C)] 98.6 F (37 C) (01/24 0537) Pulse Rate:  [81-100] 81 (01/24 0537) Resp:  [18] 18 (01/24 0537) BP: (106-126)/(78-90) 106/83 mmHg (01/24 0537) SpO2:  [96 %-99 %] 96 % (01/24 0537) Last BM Date: 09/19/15 Filed Weights   09/18/15 2036  Weight: 84.324 kg (185 lb 14.4 oz)   General: pleasant, calmer, comfortable. Does not look ill   Heart: RRR Chest: clear bil.   No cough or labored breathing Abdomen: soft, active BS, ND, NT.  Extremities: no CCE Neuro/Psych:  No gross weakness.  Speech clear and appropriate.  Calm.   Intake/Output from previous day:    Intake/Output this shift:    Lab Results:  Recent Labs  09/18/15 1543 09/18/15 1549 09/19/15 0906 09/20/15 0535  WBC 12.8*  --  9.5 9.4  HGB 10.5* 11.2* 8.6* 7.8*  HCT 30.7* 33.0* 25.3* 22.8*  PLT 242  --  197 206   BMET  Recent Labs  09/18/15 1543 09/18/15 1549 09/19/15 0906 09/20/15 0535  NA 139 139 141 138  K 4.9 4.8 4.2 3.8  CL 107 107 107 107  CO2 21*  --  24 26  GLUCOSE 136* 132* 110* 98  BUN 74* 78* 46* 26*  CREATININE 1.31* 1.30* 1.28* 1.25*  CALCIUM 9.0  --  8.9 8.7*   LFT  Recent Labs  09/18/15 1543  PROT 6.0*  ALBUMIN 3.4*  AST 20  ALT 20  ALKPHOS 68  BILITOT 0.4   PT/INR  Recent Labs  09/18/15 1543  LABPROT 14.1  INR 1.07   Hepatitis Panel No results for input(s): HEPBSAG, HCVAB, HEPAIGM, HEPBIGM in the last 72 hours.  Studies/Results: Dg Chest 2 View  09/19/2015  CLINICAL DATA:  Status post CVA. Generalized weakness, acute onset. Initial encounter. EXAM: CHEST  2 VIEW COMPARISON:  Chest radiograph performed 01/15/2015, and CTA of the chest  performed 01/17/2015 FINDINGS: The lungs are well-aerated and clear. There is no evidence of focal opacification, pleural effusion or pneumothorax. The heart is normal in size; the mediastinal contour is within normal limits. No acute osseous abnormalities are seen. IMPRESSION: No acute cardiopulmonary process seen. Electronically Signed   By: Garald Balding M.D.   On: 09/19/2015 00:50   Ct Head Wo Contrast  09/18/2015  CLINICAL DATA:  Patient became unresponsive, aphasic with right-sided weakness and facial droop. EXAM: CT HEAD WITHOUT CONTRAST TECHNIQUE: Contiguous axial images were obtained from the base of the skull through the vertex without intravenous contrast. COMPARISON:  CT of the head 05/20/2015 FINDINGS: Brain: No evidence of acute infarction, hemorrhage, extra-axial collection, ventriculomegaly, or mass effect. 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 Vascular: No hyperdense vessel or unexpected calcification. Skull: Negative for fracture or focal lesion. Sinuses/Orbits: No acute findings. Other: None. IMPRESSION: No acute intracranial abnormality. These results were called by telephone at the time of interpretation on 09/18/2015 at 3:46 pm to Dr. Carmin Muskrat , who verbally acknowledged these results. Electronically Signed   By: Fidela Salisbury M.D.   On: 09/18/2015 15:50   [name]  09/18/2015  CLINICAL DATA:  55 year old hypertensive male with hyperlipidemia complaining of aphasia and right leg weakness. Subsequent encounter. EXAM: MRI HEAD WITHOUT CONTRAST TECHNIQUE: Multiplanar, multiecho pulse sequences of the brain and surrounding structures were obtained without intravenous contrast. COMPARISON:  09/18/2015 CT.  02/12/2015 MR. FINDINGS: Exam is motion degraded. No acute infarct or intracranial hemorrhage. Chronically occluded left internal carotid artery. Increased signal within the left middle cerebral artery branch suggestive of slow flow indicating inadequate  collateral supply. This was noted on prior exam although slightly more apparent on the current exam. Small caliber intracranial vasculature may indicate changes of atherosclerosis. No intracranial mass lesion noted on this unenhanced exam. No hydrocephalus. Mild exophthalmos.  Orbital structures otherwise unremarkable. Partially empty non expanded sella incidentally noted. Cervical medullary junction unremarkable. Minimal mucosal thickening ethmoid sinus air cells. IMPRESSION: Exam is motion degraded. No acute infarct or intracranial hemorrhage. Chronically occluded left internal carotid artery. Increased signal within the left middle cerebral artery branch suggestive of slow flow indicating inadequate collateral supply. This was noted on prior exam although slightly more apparent on the current exam. Small caliber intracranial vasculature may indicate changes of atherosclerosis. No intracranial mass lesion noted on this unenhanced exam. Mild exophthalmos. Electronically Signed   By: Genia Del M.D.   On: 09/18/2015 18:01   Scheduled Meds: . atorvastatin  80 mg Oral Daily  . pantoprazole (PROTONIX) IV  40 mg Intravenous Q12H   Continuous Infusions: . sodium chloride     PRN Meds:.hydrALAZINE, senna-docusate   ASSESMENT:   * Recurrent GI bleeding (loose, dark stools) in patient with history of NSAID-induced gastric antral ulcers. Patient compliant with twice daily PPI in conjunction with full dose aspirin as therapy for cerebral/peripheral vascular disease.  Given his history as well as the sudden increase in BUN, suspect upper GI bleed  ASA on hold BID IV Protonix in place.   * ABL anemia.   Hgb 10.5 to 8.6 to 7.8 in last 3 days.    *  Carotid artery disease.  Likely TIA on arrival, sxs resolved. ? Underlying personality/psych disorder.    PLAN   *  EGD set for 1:45 today.   CBC in AM.     Martin Archer  09/20/2015, 8:30 AM Pager: [phone]     Attending physician's note   I  have taken an interval history, reviewed the chart and examined the patient. I agree with the Advanced Practitioner's note, impression and recommendations.   Lucio Edward, MD [facility] [phone] Mon-Fri 8a-5p [phone] after 5p, weekends, holidays

## 2015-09-20 NOTE — Progress Notes (Signed)
OT Cancellation Note  Patient Details Name: Martin Archer MRN: [id number] DOB: [birth year]   Cancelled Treatment:    Reason Eval/Treat Not Completed: OT screened, no needs identified, will sign off. Pt reports he is back to his baseline, no issues with right sided weakness and he has been up and about by himself in room (has been signed off by staff to do so). He feels there will no issues with basic ADLs or IADLs.  [name] [id number] 09/20/2015, 8:39 AM

## 2015-09-20 NOTE — Progress Notes (Signed)
STROKE TEAM PROGRESS NOTE   HISTORY OF PRESENT ILLNESS Martin Archer is an 55 y.o. male with a past medical history significant for HTN, HLD, OSA, gastric ulcer with GI bleed, who initially came to the ED due to weakness, vomiting, vertigo, and dark stools. EMS stated he gave a long list of symptoms in route. While in the ED he was reportedly aphasic and not moving his right leg which prompted code stroke activation. However, his deficits virtually went away after returning from CT. Nurse mentioned that patient was not speaking on her initial assessment but " saying random words like "super" repeatedly, but not answering any questions, aphasic, crying and laughing intermittently". CT head showed no acute abnormality. Denies HA, vertigo, double vision, visual disturbances, focal weakness or numbness. He was last known well 09/18/15 at 3:15 pm. NIHSS: 5. MRS: 0. Patient was not administered TPA secondary to symptoms resolved. He was admitted for further evaluation and treatment.   SUBJECTIVE (INTERVAL HISTORY) No family is at the bedside.   . Overall he feels his condition is stable.   OBJECTIVE Temp:  [98.4 F (36.9 C)-98.6 F (37 C)] 98.4 F (36.9 C) (01/24 1021) Pulse Rate:  [78-84] 83 (01/24 1440) Cardiac Rhythm:  [-] Normal sinus rhythm (01/24 0743) Resp:  [15-18] 16 (01/24 1440) BP: (102-144)/(69-98) 110/85 mmHg (01/24 1440) SpO2:  [96 %-100 %] 100 % (01/24 1440)  CBC:   Recent Labs Lab 09/18/15 1543  09/19/15 0906 09/20/15 0535  WBC 12.8*  --  9.5 9.4  NEUTROABS 9.2*  --   --   --   HGB 10.5*  < > 8.6* 7.8*  HCT 30.7*  < > 25.3* 22.8*  MCV 92.7  --  92.3 91.9  PLT 242  --  197 206  < > = values in this interval not displayed.  Basic Metabolic Panel:   Recent Labs Lab 09/19/15 0906 09/20/15 0535  NA 141 138  K 4.2 3.8  CL 107 107  CO2 24 26  GLUCOSE 110* 98  BUN 46* 26*  CREATININE 1.28* 1.25*  CALCIUM 8.9 8.7*    Lipid Panel:     Component Value  Date/Time   CHOL 179 09/19/2015 0906   CHOL 203* 03/25/2015 0938   CHOL 286* 10/12/2013 0436   TRIG 617* 09/19/2015 0906   TRIG 572* 10/12/2013 0436   TRIG 284* 07/15/2006 1158   HDL 26* 09/19/2015 0906   HDL 52 03/25/2015 0938   HDL 35* 10/12/2013 0436   CHOLHDL 6.9 09/19/2015 0906   CHOLHDL 3.9 03/25/2015 0938   CHOLHDL 5.9 CALC 07/15/2006 1158   VLDL UNABLE TO CALCULATE IF TRIGLYCERIDE OVER 400 mg/dL 09/19/2015 0906   LDLCALC UNABLE TO CALCULATE IF TRIGLYCERIDE OVER 400 mg/dL 09/19/2015 0906   LDLCALC 83 03/25/2015 0938   LDLCALC NOT CALC 10/12/2013 0436   HgbA1c:  Lab Results  Component Value Date   HGBA1C 5.5 09/19/2015   Urine Drug Screen:     Component Value Date/Time   LABOPIA POSITIVE* 09/18/2015 2122   COCAINSCRNUR NONE DETECTED 09/18/2015 2122   LABBENZ NONE DETECTED 09/18/2015 2122   AMPHETMU NONE DETECTED 09/18/2015 2122   THCU POSITIVE* 09/18/2015 2122   LABBARB NONE DETECTED 09/18/2015 2122      IMAGING  Dg Chest 2 View 09/19/2015  No acute cardiopulmonary process seen.   Ct Head Wo Contrast 09/18/2015   No acute intracranial abnormality.   [name] 09/18/2015   Exam is motion degraded. No acute infarct or  intracranial hemorrhage. Chronically occluded left internal carotid artery. Increased signal within the left middle cerebral artery branch suggestive of slow flow indicating inadequate collateral supply. This was noted on prior exam although slightly more apparent on the current exam. Small caliber intracranial vasculature may indicate changes of atherosclerosis. No intracranial mass lesion noted on this unenhanced exam. Mild exophthalmos.    PHYSICAL EXAM Pleasant middle-aged Caucasian male currently not in distress. . Afebrile. Head is nontraumatic. Neck is supple without bruit.    Cardiac exam no murmur or gallop. Lungs are clear to auscultation. Distal pulses are well felt. Neurological Exam ;  Awake  Alert oriented x 3. Normal speech  and language.eye movements full without nystagmus.fundi were not visualized. Vision acuity and fields appear normal. Hearing is normal. Palatal movements are normal. Face symmetric. Tongue midline. Normal strength, tone, reflexes and coordination. Normal sensation. Gait deferred. ASSESSMENT/PLAN [name] is a 56 y.o. male with history of HTN, HLD, OSA, gastric ulcer with GI bleed presenting with weakness, vomiting, vertigo and dark stools who developed reported aphasia and hemiparesis of his right leg in the ED. He did not receive IV t-PA due to resolved symptoms.  Right hemiparesis due to hypoperfusion in setting of occluded left ICA  Resultant  Recurrent R HP  MRI  No acute stroke  Recent EEG normal  Carotid Doppler  09/01/2015 L ICA occluded, R open  2D Echo   Left ventricle: The cavity size was normal. There was mild concentric hypertrophy. Systolic function was normal. Wall motion was normal; there were no regional wall motion abnormalities  LDL unable to calculate  HgbA1c  5.5  SCDs for VTE prophylaxis Diet NPO time specified Except for: Sips with Meds  aspirin 325 mg daily prior to admission,  aspirin 325 mg daily now on hold  Ongoing aggressive stroke risk factor management  Therapy recommendations:  No PT needs  Disposition:  Return home  Hypertension  Stable  Hyperlipidemia  Home meds:  Lipitor 80, resumed in hospital  LDL unable to calculate, goal < 70  Continue statin at discharge  Carotid stenosis  Known left CCA occlusion  Status post right CEA May 2016, open during last carotid done this month  Other Stroke Risk Factors  Former Cigarette smoker, quit smoking 8 months ago, advised to stop smoking  ETOH use  Urine drug screen positive for opiates THC again this admission like May 2016  Hx stroke/TIA   May 2016 - right brain TIA, TEE with ulcerated plaque in aortic arch, bilateral jugular vein thrombosis and near R ICA  occlusion, placed on dual antiplatelets x 3 mos and R CEA performed  Family hx stroke (father, paternal uncle)  Obstructive sleep apnea, not on CPAP at home  Other Active Problems  Hematemesis  Diarrhea  Delirium  CKD stage II  Hospital day # [place] for Pager information 09/20/2015 2:45 PM  I have personally examined this patient, reviewed notes, independently viewed imaging studies, participated in medical decision making and plan of care. I have made any additions or clarifications directly to the above note. Agree with note above.  She presented with the packing GI bleed and the ER developed aphasia and right leg weakness likely due to a left brain TIA of hemodynamic etiology due to known chronic left carotid occlusion and hypotension and hypovolemia from acute GI hemorrhage. He remains at risk for neurological worsening, recurrent stroke, TIA needs ongoing evaluation and aggressive risk  factor modification. Recommend maximize volume status and avoid hypotension. He was also counseled to quit marijuana and smoking.F/U with Dr Erlinda Hong as outpatient in stroke clinic Antony Contras, MD Medical Director [place] Pager: [phone] 09/20/2015 2:45 PM   To contact Stroke Continuity provider, please refer to [email or web address] After hours, contact General Neurology

## 2015-09-20 NOTE — H&P (View-Only) (Signed)
[facility] Consult: 12:54 PM 09/19/2015  LOS: 1 day    Referring Provider: Dr Tana Coast.    Primary Care Physician:  [name], MD Primary Gastroenterologist:  Dr Sharlett Iles then Dr Fuller Plan.     Reason for Consultation:  Anemia.   HPI: Martin Archer is a 55 y.o. male.    Hx htn.  HLD.  Carotic stensois, s/p bil CEA left 2008, right 2016. EF 55 to 60% and grade 1 distolilc dysfunction on 12/2014 Echo.  Squamous cell skin cancer.  Hx NSAID gastric ulcers 09/2013 EGD to follow up gastric ulcer.  3-5 mm shallow antral ulcer at lesser curvature.  Not atypical appearing. 05/2013 EGD with Dr Paulita Fujita.  Large, clean based antral ulcer, mild pyloric stenosis, likely NSAID injury. 01/2010 EGD healing 5 mm prepuloric ulcer, dried blood, no VV or active bleeding.  Edema at pyloric channel, food in fundus. Path negative for H pylori, intestinal metaplasia.   01/2010 Colonoscopy screening study.  Mild sigmoid tics.  73mm sessile sigmoid. polyp (path: HP).  Repeat colonoscopy screening 2021. Pt on BID Protonix 40 mg and says he has not run out of med.  Patient last seen by Dr. Fuller Plan in 10/2013.  Plan was to repeat upper endoscopy 01/2014   C/o left sided weakness and on 12/2014 CT angio had occludded left carotid, 85% right carotid stenosis, non-occlusive thrombus in left external jugular as well as ? Of same in right jugular vein.  Underwent right CEA on 01/19/15.  Placed on PLavix and 325 ASA. In 01/2015 was c/o right arm and leg intermittent paralysis. Plavix was discontinued ~ 01/2015, 325 ASA continued.  Po iron stopped 02/2015 as iron studies normal.  Imaging also shows severe athero disease of aortic arch.   Anemia, Hgb 9 noted by Dr Linna Darner 01/2015.  Placed on po iron.  Pt told MD stools were dark before starting po iron so started on  PPI.  Po iron was discontinued in late 01/2015 after iron studies revealed iron level high at 286, ferritin level of 30, iron saturation high at 92.   Hemoglobin rechecked in late July was 12.7. It was 14.8 on 06/30/2015. Yesterday 1/22 it was 10.5. This morning it is 8.6. His MCV has repeatedly been in the low 90s dating back to 01/2015.  On Friday and Saturday, so starting 1/20, the patient felt fatigue and weakness, like he was "coming down with something ".  Early a.m. 1/22, Sunday he awoke with cold sweats and proceeded to pass loose, black stools several times. Later he became dizzy. He did not have any syncope. He called EMS and was transported to the emergency room.  Stool was brown and tested heme positive per emergency room physician.  No black stools have been documented since his arrival in the emergency room yesterday. He was having the right sided paresis which has been plaguing him now for many months. CT scan of the brain was negative. He is to be seen by the neurologist today but there is question as to whether he has been having TIAs.  At arrival to the emergency room patient had a BUN of 74/creatinine 1.3 this compares with BUN/creat 20/1.1 on 06/30/15. Patient's coags are normal. FOBT is positive.  C. difficile is negative and hospitalist also ordered a stool pathogen panel.  Patient says baseline bowel movements were once daily, brown, formed up until this weekend. His appetite was also within normal limits up until the weekend. Patient denies use of NSAIDs, though he is taking the full dose aspirin. 4 lumbar spine pain, he takes hydrocodone periodically as needed. 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 Patient has never had blood transfusion per his recall. He hasn't seen any blood in his stool. No abdominal or epigastric pain.    Past Medical History  Diagnosis Date  . Gastric ulcer with  hemorrhage and obstruction 2011    Dr F[facility]  . Anemia, unspecified   . Personal history of colonic polyps 01/30/2010    hyperplastic rectal  . Diverticulosis of colon (without mention of hemorrhage)   . Sleep apnea     on CPAP; [facility]  . Hypertension   . Hyperlipemia   . Arthritis     Past Surgical History  Procedure Laterality Date  . Inguinal hernia repair  1963  . Wisdom tooth extraction    . Nasal fracture surgery    . Carotid endarterectomy  11/2006    left Dr Mamie Nick  . Esophagogastroduodenoscopy Left 06/17/2013    Procedure: ESOPHAGOGASTRODUODENOSCOPY (EGD);  Surgeon: Arta Silence, MD;  Location: [facility] ENDOSCOPY;  Service: Endoscopy;  Laterality: Left;  . Endarterectomy Right 01/19/2015    Procedure: RIGHT CAROTID ENDARTERECTOMY WITH PATCH ANGIOPLASTY;  Surgeon: Mal Misty, MD;  Location: [place];  Service: Vascular;  Laterality: Right;  . Tee without cardioversion N/A 01/17/2015    Procedure: TRANSESOPHAGEAL ECHOCARDIOGRAM (TEE);  Surgeon: Sanda Klein, MD;  Location: [facility] ENDOSCOPY;  Service: Cardiovascular;  Laterality: N/A;    Prior to Admission medications   Medication Sig Start Date End Date Taking? Authorizing Provider  amLODipine (NORVASC) 10 MG tablet Take 1 tablet (10 mg total) by mouth daily. 03/10/15  Yes Hendricks Limes, MD  aspirin 325 MG tablet Take 325 mg by mouth daily. Reported on 09/18/2015   Yes Historical Provider, MD  atorvastatin (LIPITOR) 80 MG tablet Take 1 tablet (80 mg total) by mouth daily. 04/28/15  Yes Hendricks Limes, MD  losartan (COZAAR) 100 MG tablet Take 1 tablet (100 mg total) by mouth daily. 12/08/14  Yes Hendricks Limes, MD  metoprolol tartrate (LOPRESSOR) 25 MG tablet TAKE 1 TABLET BY MOUTH TWICE DAILY 09/10/15  Yes Biagio Borg, MD  Multiple Vitamins-Minerals (MULTIVITAL PO) Take 1 tablet by mouth daily.    Yes Historical Provider, MD  pantoprazole (PROTONIX) 40 MG tablet Take 1 tablet (40 mg total) by mouth  2 (two) times daily. 02/08/15  Yes Ladene Artist, MD  HYDROcodone-acetaminophen (NORCO/VICODIN) 5-325 MG per tablet Take 1 tablet by mouth every 6 (six) hours as needed for moderate pain. 01/20/15   Ulyses Amor, PA-C    Scheduled Meds: . atorvastatin  80 mg Oral Daily  . pantoprazole (PROTONIX) IV  40 mg Intravenous Q12H   Infusions:   PRN Meds: hydrALAZINE, senna-docusate   Allergies as of 09/18/2015 - Review Complete 09/18/2015  Allergen Reaction Noted  . Adderall [amphetamine-dextroamphet er] Diarrhea 07/14/2013  . Prozac [fluoxetine hcl] Diarrhea 07/14/2013  .  Sertraline hcl Diarrhea 07/01/2009  . Other Diarrhea 09/18/2015    Family History  Problem Relation Age of Onset  . Diabetes Father   . Hypertension Father   . Stroke Father     in late 40s  . Heart attack Mother     in 24s  . Heart attack Brother 39  . Diabetes Paternal Grandfather   . Cancer Maternal Grandfather     unknown type  . Heart attack Maternal Grandfather 76  . Colon cancer Neg Hx   . Esophageal cancer Neg Hx   . Stomach cancer Neg Hx   . Stroke Paternal Uncle     in late 76s    Social History   Social History  . Marital Status: Legally Separated    Spouse Name: N/A  . Number of Children: N/A  . Years of Education: N/A   Occupational History  . self employeed sales    Social History Main Topics  . Smoking status: Former Smoker -- 1.00 packs/day    Types: Cigarettes    Quit date: 12/26/2014  . Smokeless tobacco: Never Used     Comment: smoked 1976-2007 & 2009- present , up to 2 ppd, average > 1ppd  . Alcohol Use: 0.0 oz/week    0 Standard drinks or equivalent per week     Comment: currently at [facility] for treatment.   . Drug Use: No  . Sexual Activity: Not on file   Other Topics Concern  . Not on file   Social History Narrative    REVIEW OF SYSTEMS: Constitutional:  Stable weight. Generally able to perform all ADLs and go to work. ENT:  No nose bleeds Pulm:  No  cough, no shortness of breath. CV:  No palpitations, no LE edema. No PND. GU:  No hematuria, no frequency GI:  Per HPI Heme:  Per HPI   Transfusions:  Per HPI Neuro:  No headaches, no peripheral tingling or numbness Derm:  No itching, no rash or sores.  Endocrine:  No sweats or chills.  No polyuria or dysuria Immunization:  Reviewed the history. Do not see a current flu vaccine but did not inquire with the patient himself as to flu vaccination status. Travel:  None beyond local counties in last few months.    PHYSICAL EXAM: Vital signs in last 24 hours: Filed Vitals:   09/19/15 0600 09/19/15 0758  BP: 122/84 120/102  Pulse: 94 103  Temp:  98.6 F (37 C)  Resp: 16 18   Wt Readings from Last 3 Encounters:  09/18/15 84.324 kg (185 lb 14.4 oz)  06/29/15 87.091 kg (192 lb)  03/25/15 87.454 kg (192 lb 12.8 oz)    General: Patient is a somewhat unwell, anxious WM appearing his stated age. He does not look acutely ill. Head:  No swelling or facial asymmetry.  Eyes:  No scleral icterus, conjunctiva is pale. Ears:  Somewhat HOH. No hearing aid in place.  Nose:  No discharge or congestion. Mouth:  Tongue is midline. Mucosa is clear, moist and pink. Dentition fair. Neck:  No JVD, no thyromegaly. No masses. Lungs:  Clear to auscultation bilaterally. No cough. No shortness of breath. Heart: RRR. No MRG. S1/S2 audible. Abdomen:  Soft. Without masses or organomegaly. No bruits. No hernias. Bowel sounds active..   Rectal: Rectal exam was not repeated. Yesterday in the emergency room the stool was brown but FOBT positive.   Musc/Skeltl: No joint swelling, erythema or gross deformity. Extremities:  No CCE.  Neurologic:  Limb strength is full and equal bilaterally in the upper and lower extremities. No tremor. He is oriented 3. Skin:  No telangiectasia, rashes, suspicious lesions or sores. Tattoos:  None seen. Nodes:  No cervical or inguinal adenopathy.   Psych:  Anxious,  cooperative.  Intake/Output from previous day:   Intake/Output this shift:    LAB RESULTS:  Recent Labs  09/18/15 1543 09/18/15 1549 09/19/15 0906  WBC 12.8*  --  9.5  HGB 10.5* 11.2* 8.6*  HCT 30.7* 33.0* 25.3*  PLT 242  --  197   BMET Lab Results  Component Value Date   NA 141 09/19/2015   NA 139 09/18/2015   NA 139 09/18/2015   K 4.2 09/19/2015   K 4.8 09/18/2015   K 4.9 09/18/2015   CL 107 09/19/2015   CL 107 09/18/2015   CL 107 09/18/2015   CO2 24 09/19/2015   CO2 21* 09/18/2015   CO2 31 06/30/2015   GLUCOSE 110* 09/19/2015   GLUCOSE 132* 09/18/2015   GLUCOSE 136* 09/18/2015   BUN 46* 09/19/2015   BUN 78* 09/18/2015   BUN 74* 09/18/2015   CREATININE 1.28* 09/19/2015   CREATININE 1.30* 09/18/2015   CREATININE 1.31* 09/18/2015   CALCIUM 8.9 09/19/2015   CALCIUM 9.0 09/18/2015   CALCIUM 9.7 06/30/2015   LFT  Recent Labs  09/18/15 1543  PROT 6.0*  ALBUMIN 3.4*  AST 20  ALT 20  ALKPHOS 68  BILITOT 0.4   PT/INR Lab Results  Component Value Date   INR 1.07 09/18/2015   INR 1.07 01/17/2015   INR 0.93 01/15/2015   Hepatitis Panel No results for input(s): HEPBSAG, HCVAB, HEPAIGM, HEPBIGM in the last 72 hours. C-Diff No components found for: CDIFF Lipase     Component Value Date/Time   LIPASE 25 09/18/2015 2108    Drugs of Abuse     Component Value Date/Time   LABOPIA POSITIVE* 09/18/2015 2122   COCAINSCRNUR NONE DETECTED 09/18/2015 2122   LABBENZ NONE DETECTED 09/18/2015 2122   AMPHETMU NONE DETECTED 09/18/2015 2122   THCU POSITIVE* 09/18/2015 2122   LABBARB NONE DETECTED 09/18/2015 2122     RADIOLOGY STUDIES: Dg Chest 2 View  09/19/2015  CLINICAL DATA:  Status post CVA. Generalized weakness, acute onset. Initial encounter. EXAM: CHEST  2 VIEW COMPARISON:  Chest radiograph performed 01/15/2015, and CTA of the chest performed 01/17/2015 FINDINGS: The lungs are well-aerated and clear. There is no evidence of focal opacification,  pleural effusion or pneumothorax. The heart is normal in size; the mediastinal contour is within normal limits. No acute osseous abnormalities are seen. IMPRESSION: No acute cardiopulmonary process seen. Electronically Signed   By: Garald Balding M.D.   On: 09/19/2015 00:50   Ct Head Wo Contrast  09/18/2015  CLINICAL DATA:  Patient became unresponsive, aphasic with right-sided weakness and facial droop. EXAM: CT HEAD WITHOUT CONTRAST TECHNIQUE: Contiguous axial images were obtained from the base of the skull through the vertex without intravenous contrast. COMPARISON:  CT of the head 05/20/2015 FINDINGS: Brain: No evidence of acute infarction, hemorrhage, extra-axial collection, ventriculomegaly, or mass effect. 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 Vascular: No hyperdense vessel or unexpected calcification. Skull: Negative for fracture or focal lesion. Sinuses/Orbits: No acute findings. Other: None. IMPRESSION: No acute intracranial abnormality. These results were called by telephone at the time of interpretation on 09/18/2015 at 3:46 pm to Dr. Carmin Muskrat , who verbally acknowledged these results. Electronically Signed   By: Linwood Dibbles.D.  On: 09/18/2015 15:50   [name]  09/18/2015  CLINICAL DATA:  55 year old hypertensive male with hyperlipidemia complaining of aphasia and right leg weakness. Subsequent encounter. EXAM: MRI HEAD WITHOUT CONTRAST TECHNIQUE: Multiplanar, multiecho pulse sequences of the brain and surrounding structures were obtained without intravenous contrast. COMPARISON:  09/18/2015 CT.  02/12/2015 MR. FINDINGS: Exam is motion degraded. No acute infarct or intracranial hemorrhage. Chronically occluded left internal carotid artery. Increased signal within the left middle cerebral artery branch suggestive of slow flow indicating inadequate collateral supply. This was noted on prior exam although slightly more apparent on the current exam. Small caliber  intracranial vasculature may indicate changes of atherosclerosis. No intracranial mass lesion noted on this unenhanced exam. No hydrocephalus. Mild exophthalmos.  Orbital structures otherwise unremarkable. Partially empty non expanded sella incidentally noted. Cervical medullary junction unremarkable. Minimal mucosal thickening ethmoid sinus air cells. IMPRESSION: Exam is motion degraded. No acute infarct or intracranial hemorrhage. Chronically occluded left internal carotid artery. Increased signal within the left middle cerebral artery branch suggestive of slow flow indicating inadequate collateral supply. This was noted on prior exam although slightly more apparent on the current exam. Small caliber intracranial vasculature may indicate changes of atherosclerosis. No intracranial mass lesion noted on this unenhanced exam. Mild exophthalmos. Electronically Signed   By: Genia Del M.D.   On: 09/18/2015 18:01    ENDOSCOPIC STUDIES: Per HPI  IMPRESSION:   *  Recurrent GI bleeding in patient with history of NSAID-induced gastric antral ulcers. Patient has been compliant with twice daily PPI but for several months has been taking full dose aspirin as therapy for cerebral/peripheral vascular disease.  Given his history as well as the sudden increase in BUN, suspect upper GI bleed   *  ABL anemia.    PLAN:     *  Needs EGD. Will arrange this for tomorrow, right now it is slated for 1:30 p.m..  Ok to eat full liquids tonight, clears in AM.  Stop enteric precautions now, C diff is negative and the frequent stools are likely  due to GI bleed, very much doubt this is an infectious process.    Azucena Freed  09/19/2015, 12:54 PM Pager: [phone]      Attending physician's note   I have taken a history, examined the patient and reviewed the chart. I agree with the Advanced Practitioner's note, impression and recommendations. Melena and ABL anemia. Suspected UGI bleed in patient with a history of  NSAID induced gastric ulcers. Monitor Hb. Continue PPI. EGD tomorrow.   Lucio Edward, MD [facility] [phone] Mon-Fri 8a-5p [phone] after 5p, weekends, holidays

## 2015-09-21 ENCOUNTER — Encounter (HOSPITAL_COMMUNITY): Payer: Self-pay | Admitting: Gastroenterology

## 2015-09-21 DIAGNOSIS — I10 Essential (primary) hypertension: Secondary | ICD-10-CM | POA: Diagnosis not present

## 2015-09-21 DIAGNOSIS — K250 Acute gastric ulcer with hemorrhage: Secondary | ICD-10-CM | POA: Diagnosis not present

## 2015-09-21 DIAGNOSIS — E785 Hyperlipidemia, unspecified: Secondary | ICD-10-CM | POA: Diagnosis not present

## 2015-09-21 LAB — BASIC METABOLIC PANEL
ANION GAP: 14 (ref 5–15)
BUN: 15 mg/dL (ref 6–20)
CALCIUM: 8.3 mg/dL — AB (ref 8.9–10.3)
CO2: 25 mmol/L (ref 22–32)
Chloride: 100 mmol/L — ABNORMAL LOW (ref 101–111)
Creatinine, Ser: 1.38 mg/dL — ABNORMAL HIGH (ref 0.61–1.24)
GFR, EST NON AFRICAN AMERICAN: 56 mL/min — AB (ref >60)
Glucose, Bld: 111 mg/dL — ABNORMAL HIGH (ref 65–99)
POTASSIUM: 4.3 mmol/L (ref 3.5–5.1)
SODIUM: 139 mmol/L (ref 135–145)

## 2015-09-21 LAB — HEMOGLOBIN AND HEMATOCRIT, BLOOD
HCT: 23.9 % — ABNORMAL LOW (ref 39.0–52.0)
HEMOGLOBIN: 8.1 g/dL — AB (ref 13.0–17.0)

## 2015-09-21 MED ORDER — FENOFIBRATE 54 MG PO TABS
54.0000 mg | ORAL_TABLET | Freq: Every day | ORAL | Status: DC
  Filled 2015-09-21: qty 1

## 2015-09-21 MED ORDER — FERROUS SULFATE 325 (65 FE) MG PO TABS: 325.0000 mg | ORAL_TABLET | Freq: Two times a day (BID) | ORAL | Status: DC

## 2015-09-21 MED ORDER — PANTOPRAZOLE SODIUM 40 MG PO TBEC: 40.0000 mg | DELAYED_RELEASE_TABLET | Freq: Two times a day (BID) | ORAL | Status: DC

## 2015-09-21 MED ORDER — ASPIRIN EC 81 MG PO TBEC: 81.0000 mg | DELAYED_RELEASE_TABLET | Freq: Every day | ORAL | Status: DC

## 2015-09-21 MED ORDER — FENOFIBRATE 54 MG PO TABS: 54.0000 mg | ORAL_TABLET | Freq: Every day | ORAL | Status: DC

## 2015-09-21 MED ORDER — METOPROLOL TARTRATE 25 MG PO TABS: 12.5000 mg | ORAL_TABLET | Freq: Two times a day (BID) | ORAL | Status: DC

## 2015-09-21 NOTE — Progress Notes (Signed)
Pt discharged home with family. IV and telemetry discontinued. Discharge information given. Pt left unit via wheelchair with volunteer services at 1420. Wendee Copp

## 2015-09-21 NOTE — Discharge Summary (Signed)
PATIENT DETAILS Name: Martin Archer Age: 55 y.o. Sex: male Date of Birth: Sep 18, 1960 MRN: [id number]. Admitting Physician: Orson Eva, MD [id number] Martin Reichmann, MD  Admit Date: 09/18/2015 Discharge date: 09/21/2015  Recommendations for Outpatient Follow-up:  1. EGD biopsy results still pending-please follow 2. PPI twice a day-long term 3. Repeat endoscopy in 3 months 4. For now-ASA 81 mg. After clearance from GI-can increase back to 325 mg. 5. Triglycerides more than 600-started on TriCor-please check LFTs periodically (also on statin). Please repeat lipid panel in 3 months. 6. Currently only on metoprolol 12.5 mg twice a day-please reassess at next visit if other agents need to be added. 7. Please repeat CBC/BMET in 1 week  PRIMARY DISCHARGE DIAGNOSIS:  Active Problems:   Essential hypertension   HLD (hyperlipidemia)   Right hemiparesis (HCC)   Chronic blood loss anemia   Hematemesis   Melena   Gastric ulcer with hemorrhage      PAST MEDICAL HISTORY: Past Medical History  Diagnosis Date  . Gastric ulcer with hemorrhage and obstruction 2011    Dr [facility]  . Anemia, unspecified   . Personal history of colonic polyps 01/30/2010    hyperplastic rectal  . Diverticulosis of colon (without mention of hemorrhage)   . Sleep apnea     on CPAP; [facility]  . Hypertension   . Hyperlipemia   . Arthritis     DISCHARGE MEDICATIONS: Current Discharge Medication List    START taking these medications   Details  aspirin EC 81 MG tablet Take 1 tablet (81 mg total) by mouth daily. Qty: 30 tablet, Refills: 0    fenofibrate 54 MG tablet Take 1 tablet (54 mg total) by mouth daily. Qty: 60 tablet, Refills: 0    ferrous sulfate 325 (65 FE) MG tablet Take 1 tablet (325 mg total) by mouth 2 (two) times daily with a meal. Qty: 90 tablet, Refills: 0      CONTINUE these medications which have CHANGED   Details  metoprolol tartrate (LOPRESSOR) 25 MG tablet  Take 0.5 tablets (12.5 mg total) by mouth 2 (two) times daily. Qty: 60 tablet, Refills: 0    pantoprazole (PROTONIX) 40 MG tablet Take 1 tablet (40 mg total) by mouth 2 (two) times daily. Qty: 120 tablet, Refills: 0      CONTINUE these medications which have NOT CHANGED   Details  atorvastatin (LIPITOR) 80 MG tablet Take 1 tablet (80 mg total) by mouth daily. Qty: 90 tablet, Refills: 1    Multiple Vitamins-Minerals (MULTIVITAL PO) Take 1 tablet by mouth daily.     HYDROcodone-acetaminophen (NORCO/VICODIN) 5-325 MG per tablet Take 1 tablet by mouth every 6 (six) hours as needed for moderate pain. Qty: 30 tablet, Refills: 0      STOP taking these medications     amLODipine (NORVASC) 10 MG tablet      aspirin 325 MG tablet      losartan (COZAAR) 100 MG tablet         ALLERGIES:   Allergies  Allergen Reactions  . Adderall [Amphetamine-Dextroamphet Er] Diarrhea  . Prozac [Fluoxetine Hcl] Diarrhea  . Sertraline Hcl Diarrhea  . Other Diarrhea    Advil, ibuprofen, tylenol, (excedrin is OK)    BRIEF HPI:  See H&P, Labs, Consult and Test reports for all details in brief, patient is a 55 year old male with a history of hypertension, dyslipidemia, prior history of gastric ulcer with bleeding presented to the ED with weakness, walking vomiting and dark stools.  He was admitted for further evaluation and treatment.  CONSULTATIONS:   GI and neurology  PERTINENT RADIOLOGIC STUDIES: Dg Chest 2 View  09/19/2015  CLINICAL DATA:  Status post CVA. Generalized weakness, acute onset. Initial encounter. EXAM: CHEST  2 VIEW COMPARISON:  Chest radiograph performed 01/15/2015, and CTA of the chest performed 01/17/2015 FINDINGS: The lungs are well-aerated and clear. There is no evidence of focal opacification, pleural effusion or pneumothorax. The heart is normal in size; the mediastinal contour is within normal limits. No acute osseous abnormalities are seen. IMPRESSION: No acute cardiopulmonary  process seen. Electronically Signed   By: Garald Balding M.D.   On: 09/19/2015 00:50   Ct Head Wo Contrast  09/18/2015  CLINICAL DATA:  Patient became unresponsive, aphasic with right-sided weakness and facial droop. EXAM: CT HEAD WITHOUT CONTRAST TECHNIQUE: Contiguous axial images were obtained from the base of the skull through the vertex without intravenous contrast. COMPARISON:  CT of the head 05/20/2015 FINDINGS: Brain: No evidence of acute infarction, hemorrhage, extra-axial collection, ventriculomegaly, or mass effect. 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 Vascular: No hyperdense vessel or unexpected calcification. Skull: Negative for fracture or focal lesion. Sinuses/Orbits: No acute findings. Other: None. IMPRESSION: No acute intracranial abnormality. These results were called by telephone at the time of interpretation on 09/18/2015 at 3:46 pm to Dr. Carmin Muskrat , who verbally acknowledged these results. Electronically Signed   By: Fidela [facility] M.D.   On: 09/18/2015 15:50   [name]  09/18/2015  CLINICAL DATA:  55 year old hypertensive male with hyperlipidemia complaining of aphasia and right leg weakness. Subsequent encounter. EXAM: MRI HEAD WITHOUT CONTRAST TECHNIQUE: Multiplanar, multiecho pulse sequences of the brain and surrounding structures were obtained without intravenous contrast. COMPARISON:  09/18/2015 CT.  02/12/2015 MR. FINDINGS: Exam is motion degraded. No acute infarct or intracranial hemorrhage. Chronically occluded left internal carotid artery. Increased signal within the left middle cerebral artery branch suggestive of slow flow indicating inadequate collateral supply. This was noted on prior exam although slightly more apparent on the current exam. Small caliber intracranial vasculature may indicate changes of atherosclerosis. No intracranial mass lesion noted on this unenhanced exam. No hydrocephalus. Mild exophthalmos.  Orbital structures otherwise  unremarkable. Partially empty non expanded sella incidentally noted. Cervical medullary junction unremarkable. Minimal mucosal thickening ethmoid sinus air cells. IMPRESSION: Exam is motion degraded. No acute infarct or intracranial hemorrhage. Chronically occluded left internal carotid artery. Increased signal within the left middle cerebral artery branch suggestive of slow flow indicating inadequate collateral supply. This was noted on prior exam although slightly more apparent on the current exam. Small caliber intracranial vasculature may indicate changes of atherosclerosis. No intracranial mass lesion noted on this unenhanced exam. Mild exophthalmos. Electronically Signed   By: Genia Del M.D.   On: 09/18/2015 18:01     PERTINENT LAB RESULTS: CBC:  Recent Labs  09/19/15 0906 09/20/15 0535  09/20/15 2057 09/21/15 0526  WBC 9.5 9.4  --   --   --   HGB 8.6* 7.8*  < > 7.7* 8.1*  HCT 25.3* 22.8*  < > 22.6* 23.9*  PLT 197 206  --   --   --   < > = values in this interval not displayed. CMET CMP     Component Value Date/Time   NA 139 09/21/2015 0526   NA 139 03/25/2015 0938   K 4.3 09/21/2015 0526   CL 100* 09/21/2015 0526   CO2 25 09/21/2015 0526   GLUCOSE 111*  09/21/2015 0526   GLUCOSE 97 03/25/2015 0938   BUN 15 09/21/2015 0526   BUN 22 03/25/2015 0938   CREATININE 1.38* 09/21/2015 0526   CALCIUM 8.3* 09/21/2015 0526   PROT 6.0* 09/18/2015 1543   ALBUMIN 3.4* 09/18/2015 1543   AST 20 09/18/2015 1543   ALT 20 09/18/2015 1543   ALKPHOS 68 09/18/2015 1543   BILITOT 0.4 09/18/2015 1543   GFRNONAA 56* 09/21/2015 0526   GFRAA >60 09/21/2015 0526    GFR Estimated Creatinine Clearance: 63.2 mL/min (by C-G formula based on Cr of 1.38).  Recent Labs  09/18/15 2108  LIPASE 25   No results for input(s): CKTOTAL, CKMB, CKMBINDEX, TROPONINI in the last 72 hours. Invalid input(s): POCBNP No results for input(s): DDIMER in the last 72 hours.  Recent Labs  09/19/15 0906    HGBA1C 5.5    Recent Labs  09/19/15 0906  CHOL 179  HDL 26*  LDLCALC UNABLE TO CALCULATE IF TRIGLYCERIDE OVER 400 mg/dL  TRIG 617*  CHOLHDL 6.9    Recent Labs  09/18/15 2108  TSH 1.423   No results for input(s): VITAMINB12, FOLATE, FERRITIN, TIBC, IRON, RETICCTPCT in the last 72 hours. Coags:  Recent Labs  09/18/15 1543  INR 1.07   Microbiology: Recent Results (from the past 240 hour(s))  Urine culture     Status: None   Collection Time: 09/18/15  9:22 PM  Result Value Ref Range Status   Specimen Description URINE, RANDOM  Final   Special Requests NONE  Final   Culture NO GROWTH 1 DAY  Final   Report Status 09/19/2015 FINAL  Final  Gastrointestinal Panel by PCR , Stool     Status: None   Collection Time: 09/18/15  9:22 PM  Result Value Ref Range Status   Campylobacter species NOT DETECTED NOT DETECTED Final   Plesimonas shigelloides NOT DETECTED NOT DETECTED Final   Salmonella species NOT DETECTED NOT DETECTED Final   Yersinia enterocolitica NOT DETECTED NOT DETECTED Final   Vibrio species NOT DETECTED NOT DETECTED Final   Vibrio cholerae NOT DETECTED NOT DETECTED Final   Enteroaggregative E coli (EAEC) NOT DETECTED NOT DETECTED Final   Enteropathogenic E coli (EPEC) NOT DETECTED NOT DETECTED Final   Enterotoxigenic E coli (ETEC) NOT DETECTED NOT DETECTED Final   Shiga like toxin producing E coli (STEC) NOT DETECTED NOT DETECTED Final   E. coli O157 NOT DETECTED NOT DETECTED Final   Shigella/Enteroinvasive E coli (EIEC) NOT DETECTED NOT DETECTED Final   Cryptosporidium NOT DETECTED NOT DETECTED Final   Cyclospora cayetanensis NOT DETECTED NOT DETECTED Final   Entamoeba histolytica NOT DETECTED NOT DETECTED Final   Giardia lamblia NOT DETECTED NOT DETECTED Final   Adenovirus F40/41 NOT DETECTED NOT DETECTED Final   Astrovirus NOT DETECTED NOT DETECTED Final   Norovirus GI/GII NOT DETECTED NOT DETECTED Final   Rotavirus A NOT DETECTED NOT DETECTED Final    Sapovirus (I, II, IV, and V) NOT DETECTED NOT DETECTED Final  C difficile quick scan w PCR reflex     Status: None   Collection Time: 09/18/15  9:22 PM  Result Value Ref Range Status   C Diff antigen NEGATIVE NEGATIVE Final   C Diff toxin NEGATIVE NEGATIVE Final   C Diff interpretation Negative for toxigenic C. difficile  Final     BRIEF HOSPITAL COURSE:  TIA: Right-sided weakness has completely resolved. Likely secondary to hypoperfusion (GI loss) in setting of occluded left ICA. Underwent workup-MRI brain was negative for CVA.  Carotid Doppler on 09/01/15 showed a chronically occluded left ICA, underwent a right carotid artery endarterectomy on 01/19/15. Echocardiogram showed preserved ejection fraction without any embolic source. Lipid panel showed triglycerides  617-LDL could not be calculated. A1c 5.5. Given recent GI bleeding-aspirin has been reduced to 81 mg. Patient will be continued on statin-but will add low-dose TriCor. Please continue to follow lipid panel and LFTs closely. Spoke with Dr. Leonie Man today-no further recommendations while inpatient, okay to discharge. Reccommodations are to avoid hypotension, he is euvolemic on exam.Please note, patient is ambulating in the hallway/home by himself. Speech is clear. Nonfocal exam.  Upper GI bleed: Resolved, please note-patient had brown stools on the day of discharge. Patient was seen by gastroenterology, underwent EGD which showed a single ulcer in the gastric antrum. Recommendations are to continue PPI twice a day long-term, avoid nonsteroidal anti-inflammatory medications and to repeat EGD in 3 months for second look endoscopy. Difficult situation-given history of TIA/carotid stenosis-likely needs antiplatelet medications-therefore will decrease aspirin to 81 mg daily. Have instructed patient to avoid NSAIDs, and follow-up with GI in 3 months for repeat endoscopy.  Acute blood loss anemia: Secondary to above, GI bleeding has completely resolved.  Hemoglobin on day of discharge stable at 8.1. Place on iron supplementation. Please repeat CBC at next visit. Note-patient seems to be asymptomatic-ambulating in the room without any chest pain or shortness of breath.  Diarrhea: Resolved. As noted above-brown solid stool this morning. C. difficile PCR and GI pathogen panel negative as well.  Hypertension: Will allow some mild permissive hypertension-given TIA in the setting of chronic left ICA occlusion in a setting of hypoperfusion from GI loss. Blood pressure seems reasonably stable, will only resume metoprolol-but reduce it to 12.5 mg twice a day. Patient was instructed to follow-up with his PCP in 1 week for further optimization of his antihypertensive regimen.  Dyslipidemia: Unable to calculate LDL due to hypertriglyceridemia. Given TIA/ICA occlusion-continue statin, LFTs are normal-will start low-dose TriCor. Patient instructed to follow with his PCP for close LFT monitoring and repeat lipid panel. Patient instructed not to consume alcohol or other hepatotoxic medications without a PCP.  Delirium: Resolved. Patient refused ABG.? Etiology.   TODAY-DAY OF DISCHARGE:  Subjective:   Martin Archer today has no headache,no chest abdominal pain,no new weakness tingling or numbness, feels much better wants to go home today. He had Brown stools this morning. Is ambulating in the room without any chest pain or shortness of breath.  Objective:   Blood pressure 157/88, pulse 117, temperature 98 F (36.7 C), temperature source Oral, resp. rate 18, weight 84.324 kg (185 lb 14.4 oz), SpO2 98 %.  Intake/Output Summary (Last 24 hours) at 09/21/15 1332 Last data filed at 09/21/15 1322  Gross per 24 hour  Intake    850 ml  Output      0 ml  Net    850 ml   Filed Weights   09/18/15 2036  Weight: 84.324 kg (185 lb 14.4 oz)    Exam Awake Alert, Oriented *3, No new F.N deficits, Normal affect [place].AT,PERRAL Supple Neck,No JVD, No cervical  lymphadenopathy appriciated.  Symmetrical Chest wall movement, Good air movement bilaterally, CTAB RRR,No Gallops,Rubs or new Murmurs, No Parasternal Heave +ve B.Sounds, Abd Soft, Non tender, No organomegaly appriciated, No rebound -guarding or rigidity. No Cyanosis, Clubbing or edema, No new Rash or bruise  DISCHARGE CONDITION: Stable  DISPOSITION: Home  DISCHARGE INSTRUCTIONS:    Activity:  As tolerated   Get Medicines reviewed and  adjusted: Please take all your medications with you for your next visit with your Primary MD  Please request your Primary MD to go over all hospital tests and procedure/radiological results at the follow up, please ask your Primary MD to get all Hospital records sent to his/her office.  If you experience worsening of your admission symptoms, develop shortness of breath, life threatening emergency, suicidal or homicidal thoughts you must seek medical attention immediately by calling 911 or calling your MD immediately  if symptoms less severe.  You must read complete instructions/literature along with all the possible adverse reactions/side effects for all the Medicines you take and that have been prescribed to you. Take any new Medicines after you have completely understood and accpet all the possible adverse reactions/side effects.   Do not drive when taking Pain medications.   Do not take more than prescribed Pain, Sleep and Anxiety Medications  Special Instructions: If you have smoked or chewed Tobacco  in the last 2 yrs please stop smoking, stop any regular Alcohol  and or any Recreational drug use.  Wear Seat belts while driving.  Please note  You were cared for by a hospitalist during your hospital stay. Once you are discharged, your primary care physician will handle any further medical issues. Please note that NO REFILLS for any discharge medications will be authorized once you are discharged, as it is imperative that you return to your primary  care physician (or establish a relationship with a primary care physician if you do not have one) for your aftercare needs so that they can reassess your need for medications and monitor your lab values.  Diet recommendation: Heart Healthy diet  Discharge Instructions    Ambulatory referral to Neurology    Complete by:  As directed   Dr. Erlinda Hong requests followup in 2 months     Call MD for:    Complete by:  As directed   Dark or Bloody stools     Diet - low sodium heart healthy    Complete by:  As directed      Increase activity slowly    Complete by:  As directed            Follow-up Information    Follow up with Xu,Jindong, MD In 2 months.   Specialty:  Neurology   Why:  for hospital follow up, Stroke Clinic, Office will call you with appointment date & time   Contact information:   [address] [place] [address] [phone]       Follow up with [name], MD. Schedule an appointment as soon as possible for a visit in 1 week.   Specialties:  Internal Medicine, Radiology   Why:  Hospital follow up   Contact information:   [place] [address] [phone]       Total Time spent on discharge equals 45 minutes.  SignedOren Binet 09/21/2015 1:32 PM

## 2015-09-27 ENCOUNTER — Ambulatory Visit: Payer: 59 | Admitting: Vascular Surgery

## 2015-09-27 ENCOUNTER — Telehealth: Payer: Self-pay | Admitting: Gastroenterology

## 2015-09-27 NOTE — Telephone Encounter (Signed)
Patient was started on an oral iron supplement while inpatient.  He has a follow up appt with Dr. Jenny Reichmann this Thursday and is to have a Hgb check.  He is feeling better and has returned to work.  He reports that he is still a little weak, but about "80%".  He will call back if he has any worsening symptoms or develops new GI complaints.

## 2015-09-27 NOTE — Progress Notes (Signed)
Late entry, missed G-code.    September 21, 2015 1134  PT G-Codes **NOT FOR INPATIENT CLASS**  Functional Assessment Tool Used clinical judgment  Functional Limitation Mobility: Walking and moving around  Mobility: Walking and Moving Around Current Status [id number]) CJ  Mobility: Walking and Moving Around Goal Status [id number]) CI  Cassell Clement, PT, CSCS Pager [phone] Office [phone]

## 2015-09-27 NOTE — Progress Notes (Deleted)
Late entry, missed Gcode.   09/19/15 1134  PT G-Codes **NOT FOR INPATIENT CLASS**  Functional Assessment Tool Used clinical judgment  Functional Limitation Mobility: Walking and moving around  Mobility: Walking and Moving Around Current Status [id number]) CJ  Mobility: Walking and Moving Around Goal Status [id number]) CI  Cassell Clement, PT, CSCS Pager [phone] Office [phone]

## 2015-09-29 ENCOUNTER — Encounter: Payer: Self-pay | Admitting: Internal Medicine

## 2015-09-29 ENCOUNTER — Ambulatory Visit (INDEPENDENT_AMBULATORY_CARE_PROVIDER_SITE_OTHER): Payer: 59 | Admitting: Internal Medicine

## 2015-09-29 VITALS — BP 118/86 | HR 93 | Temp 98.0°F | Resp 20 | Ht 68.0 in | Wt 188.0 lb

## 2015-09-29 DIAGNOSIS — I10 Essential (primary) hypertension: Secondary | ICD-10-CM

## 2015-09-29 DIAGNOSIS — E785 Hyperlipidemia, unspecified: Secondary | ICD-10-CM

## 2015-09-29 DIAGNOSIS — K250 Acute gastric ulcer with hemorrhage: Secondary | ICD-10-CM | POA: Diagnosis not present

## 2015-09-29 DIAGNOSIS — R6889 Other general symptoms and signs: Secondary | ICD-10-CM

## 2015-09-29 DIAGNOSIS — Z0001 Encounter for general adult medical examination with abnormal findings: Secondary | ICD-10-CM | POA: Diagnosis not present

## 2015-09-29 NOTE — Progress Notes (Signed)
Pre visit review using our clinic review tool, if applicable. No additional management support is needed unless otherwise documented below in the visit note. 

## 2015-09-29 NOTE — Progress Notes (Signed)
Subjective:    Patient ID: Martin Archer, male    DOB: 1961-06-20, 55 y.o.   MRN: [id number]  HPI  Here to f/u recent hospn 1/22 - 1/25, with right sided weakness that resolved, upper GI bleed, ABL anemia, diarrhea, confusion now improved.  Pt denies new neurological symptoms such as new headache, or facial or extremity weakness or numbness  Denies worsening reflux, abd pain, dysphagia, n/v, bowel change or blood. Left with recommendations for PPI bid, f/u egd in 3 mo, cont asa 81 mg only, start tricor, fu BP. Declines further labs today. Lab Results  Component Value Date   WBC 9.7 09/30/2015   HGB 9.1* 09/30/2015   HCT 28.1* 09/30/2015   MCV 93.7 09/30/2015   PLT 372.0 09/30/2015   Past Medical History  Diagnosis Date  . Gastric ulcer with hemorrhage and obstruction 2011    Dr [facility]  . Anemia, unspecified   . Personal history of colonic polyps 01/30/2010    hyperplastic rectal  . Diverticulosis of colon (without mention of hemorrhage)   . Sleep apnea     on CPAP; [facility]  . Hypertension   . Hyperlipemia   . Arthritis    Past Surgical History  Procedure Laterality Date  . Inguinal hernia repair  1963  . Wisdom tooth extraction    . Nasal fracture surgery    . Carotid endarterectomy  11/2006    left Dr Mamie Nick  . Esophagogastroduodenoscopy Left 06/17/2013    Procedure: ESOPHAGOGASTRODUODENOSCOPY (EGD);  Surgeon: Arta Silence, MD;  Location: [facility] ENDOSCOPY;  Service: Endoscopy;  Laterality: Left;  . Endarterectomy Right 01/19/2015    Procedure: RIGHT CAROTID ENDARTERECTOMY WITH PATCH ANGIOPLASTY;  Surgeon: Mal Misty, MD;  Location: [facility];  Service: Vascular;  Laterality: Right;  . Tee without cardioversion N/A 01/17/2015    Procedure: TRANSESOPHAGEAL ECHOCARDIOGRAM (TEE);  Surgeon: Sanda Klein, MD;  Location: [facility] ENDOSCOPY;  Service: Cardiovascular;  Laterality: N/A;  . Esophagogastroduodenoscopy N/A 09/20/2015    Procedure:  ESOPHAGOGASTRODUODENOSCOPY (EGD);  Surgeon: Ladene Artist, MD;  Location[facility] ENDOSCOPY;  Service: Endoscopy;  Laterality: N/A;    reports that he quit smoking about 9 months ago. His smoking use included Cigarettes. He smoked 1.00 pack per day. He has never used smokeless tobacco. He reports that he drinks alcohol. He reports that he does not use illicit drugs. family history includes Cancer in his maternal grandfather; Diabetes in his father and paternal grandfather; Heart attack in his mother; Heart attack (age of onset: 80) in his brother; Heart attack (age of onset: 76) in his maternal grandfather; Hypertension in his father; Stroke in his father and paternal uncle. There is no history of Colon cancer, Esophageal cancer, or Stomach cancer. Allergies  Allergen Reactions  . Adderall [Amphetamine-Dextroamphet Er] Diarrhea  . Prozac [Fluoxetine Hcl] Diarrhea  . Sertraline Hcl Diarrhea  . Other Diarrhea    Advil, ibuprofen, tylenol, (excedrin is OK)   Current Outpatient Prescriptions on File Prior to Visit  Medication Sig Dispense Refill  . aspirin EC 81 MG tablet Take 1 tablet (81 mg total) by mouth daily. 30 tablet 0  . atorvastatin (LIPITOR) 80 MG tablet Take 1 tablet (80 mg total) by mouth daily. 90 tablet 1  . fenofibrate 54 MG tablet Take 1 tablet (54 mg total) by mouth daily. 60 tablet 0  . ferrous sulfate 325 (65 FE) MG tablet Take 1 tablet (325 mg total) by mouth 2 (two) times daily with a  meal. 90 tablet 0  . HYDROcodone-acetaminophen (NORCO/VICODIN) 5-325 MG per tablet Take 1 tablet by mouth every 6 (six) hours as needed for moderate pain. 30 tablet 0  . metoprolol tartrate (LOPRESSOR) 25 MG tablet Take 0.5 tablets (12.5 mg total) by mouth 2 (two) times daily. 60 tablet 0  . Multiple Vitamins-Minerals (MULTIVITAL PO) Take 1 tablet by mouth daily.     . pantoprazole (PROTONIX) 40 MG tablet Take 1 tablet (40 mg total) by mouth 2 (two) times daily. 120 tablet 0   No current  facility-administered medications on file prior to visit.   Review of Systems  Constitutional: Negative for unusual diaphoresis or night sweats HENT: Negative for ringing in ear or discharge Eyes: Negative for double vision or worsening visual disturbance.  Respiratory: Negative for choking and stridor.   Gastrointestinal: Negative for vomiting or other signifcant bowel change Genitourinary: Negative for hematuria or change in urine volume.  Musculoskeletal: Negative for other MSK pain or swelling Skin: Negative for color change and worsening wound.  Neurological: Negative for tremors and numbness other than noted  Psychiatric/Behavioral: Negative for decreased concentration or agitation other than above       Objective:   Physical Exam BP 118/86 mmHg  Pulse 93  Temp(Src) 98 F (36.7 C) (Oral)  Resp 20  Ht 5\' 8"  (1.727 m)  Wt 188 lb (85.276 kg)  BMI 28.59 kg/m2  SpO2 99% VS noted,  Constitutional: Pt appears in no significant distress HENT: Head: NCAT.  Right Ear: External ear normal.  Left Ear: External ear normal.  Eyes: . Pupils are equal, round, and reactive to light. Conjunctivae and EOM are normal Neck: Normal range of motion. Neck supple.  Cardiovascular: Normal rate and regular rhythm.   Pulmonary/Chest: Effort normal and breath sounds without rales or wheezing.  Abd:  Soft, NT, ND, + BS Neurological: Pt is alert. Not confused , motor grossly intact Skin: Skin is warm. No rash, no LE edema Psychiatric: Pt behavior is normal. No agitation.     Assessment & Plan:

## 2015-09-29 NOTE — Patient Instructions (Signed)
Please continue all other medications as before, and refills have been done if requested.  Please have the pharmacy call with any other refills you may need.  Please continue your efforts at being more active, low cholesterol diet, and weight control.  You will be contacted regarding the referral for: [facility]  Please keep your appointments with your specialists as you may have planned  Please go to the LAB in the Basement (turn left off the elevator) for the tests to be done tomorrow  You will be contacted by phone if any changes need to be made immediately.  Otherwise, you will receive a letter about your results with an explanation, but please check with MyChart first.  Please remember to sign up for MyChart if you have not done so, as this will be important to you in the future with finding out test results, communicating by private email, and scheduling acute appointments online when needed.  Please return in 6 months, or sooner if needed, with Lab testing done 3-5 days before

## 2015-09-30 ENCOUNTER — Other Ambulatory Visit (INDEPENDENT_AMBULATORY_CARE_PROVIDER_SITE_OTHER): Payer: 59

## 2015-09-30 DIAGNOSIS — E785 Hyperlipidemia, unspecified: Secondary | ICD-10-CM

## 2015-09-30 DIAGNOSIS — K250 Acute gastric ulcer with hemorrhage: Secondary | ICD-10-CM

## 2015-09-30 DIAGNOSIS — I10 Essential (primary) hypertension: Secondary | ICD-10-CM

## 2015-09-30 LAB — CBC WITH DIFFERENTIAL/PLATELET
BASOS PCT: 0.5 % (ref 0.0–3.0)
Basophils Absolute: 0.1 10*3/uL (ref 0.0–0.1)
EOS ABS: 0 10*3/uL (ref 0.0–0.7)
Eosinophils Relative: 0.1 % (ref 0.0–5.0)
HCT: 28.1 % — ABNORMAL LOW (ref 39.0–52.0)
HEMOGLOBIN: 9.1 g/dL — AB (ref 13.0–17.0)
LYMPHS ABS: 2.1 10*3/uL (ref 0.7–4.0)
Lymphocytes Relative: 21.4 % (ref 12.0–46.0)
MCHC: 32.5 g/dL (ref 30.0–36.0)
MCV: 93.7 fl (ref 78.0–100.0)
MONO ABS: 0.9 10*3/uL (ref 0.1–1.0)
Monocytes Relative: 9.3 % (ref 3.0–12.0)
NEUTROS PCT: 68.7 % (ref 43.0–77.0)
Neutro Abs: 6.7 10*3/uL (ref 1.4–7.7)
PLATELETS: 372 10*3/uL (ref 150.0–400.0)
RBC: 3 Mil/uL — ABNORMAL LOW (ref 4.22–5.81)
RDW: 14.4 % (ref 11.5–15.5)
WBC: 9.7 10*3/uL (ref 4.0–10.5)

## 2015-09-30 LAB — BASIC METABOLIC PANEL
BUN: 22 mg/dL (ref 6–23)
CHLORIDE: 105 meq/L (ref 96–112)
CO2: 30 meq/L (ref 19–32)
Calcium: 8.8 mg/dL (ref 8.4–10.5)
Creatinine, Ser: 1.29 mg/dL (ref 0.40–1.50)
GFR: 61.08 mL/min (ref >60.00)
GLUCOSE: 100 mg/dL — AB (ref 70–99)
POTASSIUM: 4.7 meq/L (ref 3.5–5.1)
Sodium: 140 mEq/L (ref 135–145)

## 2015-09-30 LAB — LIPID PANEL
CHOLESTEROL: 133 mg/dL (ref 0–200)
HDL: 36.7 mg/dL — ABNORMAL LOW (ref >39.00)
LDL CALC: 67 mg/dL (ref 0–99)
NonHDL: 96.76
TRIGLYCERIDES: 148 mg/dL (ref 0.0–149.0)
Total CHOL/HDL Ratio: 4
VLDL: 29.6 mg/dL (ref 0.0–40.0)

## 2015-10-02 NOTE — Assessment & Plan Note (Signed)
stable overall by history and exam, recent data reviewed with pt, and pt to continue medical treatment as before,  to f/u any worsening symptoms or concerns BP Readings from Last 3 Encounters:  09/29/15 118/86  09/21/15 157/88  06/29/15 130/90

## 2015-10-02 NOTE — Assessment & Plan Note (Signed)
On new tricor, tolerating well, for f/u lab next visit, cont low fat diet Lab Results  Component Value Date   CHOL 133 09/30/2015   CHOL 179 09/19/2015   CHOL 227* 06/30/2015   Lab Results  Component Value Date   HDL 36.70* 09/30/2015   HDL 26* 09/19/2015   HDL 53.70 06/30/2015   Lab Results  Component Value Date   LDLCALC 67 09/30/2015   LDLCALC UNABLE TO CALCULATE IF TRIGLYCERIDE OVER 400 mg/dL 09/19/2015   LDLCALC 83 03/25/2015   Lab Results  Component Value Date   TRIG 148.0 09/30/2015   TRIG 617* 09/19/2015   TRIG 311.0* 06/30/2015   Lab Results  Component Value Date   CHOLHDL 4 09/30/2015   CHOLHDL 6.9 09/19/2015   CHOLHDL 4 06/30/2015   Lab Results  Component Value Date   LDLDIRECT 109.0 06/30/2015   LDLDIRECT 139.0 12/08/2014   LDLDIRECT 215.4 06/28/2010

## 2015-10-02 NOTE — Assessment & Plan Note (Signed)
stable overall by history and exam, recent cbc data reviewed with pt, and pt to continue medical treatment as before,  to f/u any worsening symptoms or concerns, f/u GI as planned

## 2015-10-04 ENCOUNTER — Encounter (HOSPITAL_COMMUNITY): Payer: 59

## 2015-10-04 ENCOUNTER — Ambulatory Visit: Payer: 59 | Admitting: Vascular Surgery

## 2015-10-25 ENCOUNTER — Encounter: Payer: Self-pay | Admitting: Gastroenterology

## 2015-10-27 ENCOUNTER — Ambulatory Visit (INDEPENDENT_AMBULATORY_CARE_PROVIDER_SITE_OTHER): Payer: 59 | Admitting: Gastroenterology

## 2015-10-27 ENCOUNTER — Encounter: Payer: Self-pay | Admitting: Gastroenterology

## 2015-10-27 VITALS — BP 154/100 | HR 92 | Ht 66.5 in | Wt 186.4 lb

## 2015-10-27 DIAGNOSIS — D62 Acute posthemorrhagic anemia: Secondary | ICD-10-CM | POA: Diagnosis not present

## 2015-10-27 DIAGNOSIS — K259 Gastric ulcer, unspecified as acute or chronic, without hemorrhage or perforation: Secondary | ICD-10-CM

## 2015-10-27 NOTE — Progress Notes (Signed)
    History of Present Illness: This is a 55 year old male hospitalized in January for a GI bleed and anemia secondary to an antral ulcer. He has a history of recurrent NSAID induced ulcers. His hemoglobin improved to 9.1 on February 3. He notes his stool has been dark well taking iron but has not had melena or hematochezia. He notes occasional mild epigastric discomfort and occasional reflux and regurgitation systems despite remaining on a PPI twice daily.  Current Medications, Allergies, Past Medical History, Past Surgical History, Family History and Social History were reviewed in [employer] record.  Physical Exam: General: Well developed, well nourished, no acute distress Head: Normocephalic and atraumatic Eyes:  sclerae anicteric, EOMI Ears: Normal auditory acuity Mouth: No deformity or lesions Lungs: Clear throughout to auscultation Heart: Regular rate and rhythm; no murmurs, rubs or bruits Abdomen: Soft, non tender and non distended. No masses, hepatosplenomegaly or hernias noted. Normal Bowel sounds Musculoskeletal: Symmetrical with no gross deformities  Pulses:  Normal pulses noted Extremities: No clubbing, cyanosis, edema or deformities noted Neurological: Alert oriented x 4, grossly nonfocal Psychological:  Alert and cooperative. Normal mood and affect  Assessment and Recommendations:  1. Recurrent gastric ulcer. GERD. Continue PPI twice daily. Follow all standard antireflux measures. Repeat EGD in late April or early May. The risks (including bleeding, perforation, infection, missed lesions, medication reactions and possible hospitalization or surgery if complications occur), benefits, and alternatives to endoscopy with possible biopsy and possible dilation were discussed with the patient and they consent to proceed.  He is advised to strictly avoid all aspirin and NSAID products long-term except for a daily 81 mg enteric coated aspirin.  2. Anemia,  secondary to acute blood loss. Continue Fe bid with meals. Repeat CBC in 2 months.

## 2015-10-27 NOTE — Patient Instructions (Signed)
You have been scheduled for an endoscopy. Please follow written instructions given to you at your visit today. If you use inhalers (even only as needed), please bring them with you on the day of your procedure. Your physician has requested that you go to www.startemmi.com and enter the access code given to you at your visit today. This web site gives a general overview about your procedure. However, you should still follow specific instructions given to you by our office regarding your preparation for the procedure.  Patient advised to avoid spicy, acidic, citrus, chocolate, mints, fruit and fruit juices.  Limit the intake of caffeine, alcohol and Soda.  Don't exercise too soon after eating.  Don't lie down within 3-4 hours of eating.  Elevate the head of your bed.  Thank you for choosing me and [facility].  Pricilla Riffle. Dagoberto Ligas., MD., [facility] Regal

## 2015-11-04 ENCOUNTER — Telehealth: Payer: Self-pay | Admitting: Internal Medicine

## 2015-11-04 NOTE — Telephone Encounter (Signed)
Ok for OTC delsym and/or mucinex, but would consider Sat clinic OV tomorrow as well, thanks

## 2015-11-04 NOTE — Telephone Encounter (Signed)
Please advise 

## 2015-11-04 NOTE — Telephone Encounter (Signed)
Pt has a really bad cough, runny nose and is congested. He is hoping you can call something in for him.  He states he used medication in the past that has helped him. I did let him know he would need an appointment, but he still wanted me to have you call him.

## 2015-11-18 ENCOUNTER — Other Ambulatory Visit: Payer: Self-pay | Admitting: Internal Medicine

## 2015-12-23 ENCOUNTER — Encounter: Payer: Self-pay | Admitting: Gastroenterology

## 2015-12-23 ENCOUNTER — Ambulatory Visit (AMBULATORY_SURGERY_CENTER): Payer: 59 | Admitting: Gastroenterology

## 2015-12-23 VITALS — BP 104/76 | HR 75 | Temp 98.2°F | Resp 15 | Ht 66.0 in | Wt 186.0 lb

## 2015-12-23 DIAGNOSIS — K259 Gastric ulcer, unspecified as acute or chronic, without hemorrhage or perforation: Secondary | ICD-10-CM

## 2015-12-23 MED ORDER — SODIUM CHLORIDE 0.9 % IV SOLN: 500.0000 mL | INTRAVENOUS | Status: DC

## 2015-12-23 NOTE — Op Note (Signed)
[facility] Patient Name: Martin Archer Procedure Date: 12/23/2015 12:29 PM MRN: [id number] Endoscopist: Ladene Artist , MD Age: 55 Date of Birth: [birth year] Gender: Male Procedure:                Upper GI endoscopy Indications:              Follow-up of acute gastric ulcer with hemorrhage Medicines:                Monitored Anesthesia Care Procedure:                Pre-Anesthesia Assessment:                           - Prior to the procedure, a History and Physical                            was performed, and patient medications and                            allergies were reviewed. The patient's tolerance of                            previous anesthesia was also reviewed. The risks                            and benefits of the procedure and the sedation                            options and risks were discussed with the patient.                            All questions were answered, and informed consent                            was obtained. Prior Anticoagulants: The patient has                            taken no previous anticoagulant or antiplatelet                            agents. ASA Grade Assessment: II - A patient with                            mild systemic disease. After reviewing the risks                            and benefits, the patient was deemed in                            satisfactory condition to undergo the procedure.                           After obtaining informed consent, the endoscope was  passed under direct vision. Throughout the                            procedure, the patient's blood pressure, pulse, and                            oxygen saturations were monitored continuously. The                            Model GIF-[id number]) scope was introduced                            through the mouth, and advanced to the second part                            of duodenum. The upper GI endoscopy was                            accomplished without difficulty. The patient                            tolerated the procedure well. Scope In: Scope Out: Findings:                 A deformity was found in the gastric antrum at the                            site of the prior ulcer. Ulcer completely healed.                           Diffuse mild inflammation characterized by erythema                            was found in the gastric body.                           The exam of the stomach was otherwise normal.                           The duodenal bulb and second portion of the                            duodenum were normal.                           The examined esophagus was normal. Complications:            No immediate complications. Estimated Blood Loss:     Estimated blood loss: none. Impression:               - Acquired deformity in the gastric antrum.                           - Mild gastritis. Recommendation:           - Patient has a contact number available for  emergencies. The signs and symptoms of potential                            delayed complications were discussed with the                            patient. Return to normal activities tomorrow.                            Written discharge instructions were provided to the                            patient.                           - Resume previous diet.                           - Continue present medications.                           - No aspirin, ibuprofen, naproxen, or other                            non-steroidal anti-inflammatory drugs except EC ASA                            81 mg daily if indicated. Ladene Artist, MD 12/23/2015 1:40:58 PM This report has been signed electronically.

## 2015-12-23 NOTE — Progress Notes (Signed)
Patient awakening,vss,report to rn 

## 2015-12-23 NOTE — Patient Instructions (Signed)
YOU HAD AN ENDOSCOPIC PROCEDURE TODAY AT [facility]:   Refer to the procedure report that was given to you for any specific questions about what was found during the examination.  If the procedure report does not answer your questions, please call your gastroenterologist to clarify.  If you requested that your care partner not be given the details of your procedure findings, then the procedure report has been included in a sealed envelope for you to review at your convenience later.  YOU SHOULD EXPECT: Some feelings of bloating in the abdomen. Passage of more gas than usual.  Walking can help get rid of the air that was put into your GI tract during the procedure and reduce the bloating. If you had a lower endoscopy (such as a colonoscopy or flexible sigmoidoscopy) you may notice spotting of blood in your stool or on the toilet paper. If you underwent a bowel prep for your procedure, you may not have a normal bowel movement for a few days.  Please Note:  You might notice some irritation and congestion in your nose or some drainage.  This is from the oxygen used during your procedure.  There is no need for concern and it should clear up in a day or so.  SYMPTOMS TO REPORT IMMEDIATELY:   Following upper endoscopy (EGD)  Vomiting of blood or coffee ground material  New chest pain or pain under the shoulder blades  Painful or persistently difficult swallowing  New shortness of breath  Fever of 100F or higher  Black, tarry-looking stools  For urgent or emergent issues, a gastroenterologist can be reached at any hour by calling [phone].   DIET: Your first meal following the procedure should be a small meal and then it is ok to progress to your normal diet. Heavy or fried foods are harder to digest and may make you feel nauseous or bloated.  Likewise, meals heavy in dairy and vegetables can increase bloating.  Drink plenty of fluids but you should avoid alcoholic beverages for  24 hours.  ACTIVITY:  You should plan to take it easy for the rest of today and you should NOT DRIVE or use heavy machinery until tomorrow (because of the sedation medicines used during the test).    FOLLOW UP: Our staff will call the number listed on your records the next business day following your procedure to check on you and address any questions or concerns that you may have regarding the information given to you following your procedure. If we do not reach you, we will leave a message.  However, if you are feeling well and you are not experiencing any problems, there is no need to return our call.  We will assume that you have returned to your regular daily activities without incident.  If any biopsies were taken you will be contacted by phone or by letter within the next 1-3 weeks.  Please call us at [phone] if you have not heard about the biopsies in 3 weeks.    SIGNATURES/CONFIDENTIALITY: You and/or your care partner have signed paperwork which will be entered into your electronic medical record.  These signatures attest to the fact that that the information above on your After Visit Summary has been reviewed and is understood.  Full responsibility of the confidentiality of this discharge information lies with you and/or your care-partner.  Gastritis-handout given   No aspirin, ibuprofen, naproxen, or NSAIDs except aspirin 81 mg daily.

## 2015-12-26 ENCOUNTER — Telehealth: Payer: Self-pay | Admitting: *Deleted

## 2015-12-26 NOTE — Telephone Encounter (Signed)
Number identifier, left message, follow-up  

## 2015-12-29 ENCOUNTER — Ambulatory Visit: Payer: 59 | Admitting: Internal Medicine

## 2015-12-29 DIAGNOSIS — Z0289 Encounter for other administrative examinations: Secondary | ICD-10-CM

## 2015-12-31 ENCOUNTER — Other Ambulatory Visit: Payer: Self-pay | Admitting: Internal Medicine

## 2016-01-10 ENCOUNTER — Other Ambulatory Visit: Payer: Self-pay | Admitting: Gastroenterology

## 2016-06-10 IMAGING — CT CT ABD-PELV W/ CM
2 of 5 series · 3 of 46 positions shown, 4 images · IV contrast (Iodine)
Comparison: 02/10/2010 CT abdomen/pelvis, chest radiograph
01/15/2015, neck CTA 01/16/2015

CLINICAL DATA: Right lower lobe pulmonary nodule. Long-term smoking
history.

EXAM:
CT CHEST, ABDOMEN, AND PELVIS WITH CONTRAST
TECHNIQUE: Multidetector CT imaging of the chest, abdomen and pelvis was
performed following the standard protocol during bolus
administration of intravenous contrast.
CONTRAST:  100mL OMNIPAQUE IOHEXOL 300 MG/ML  SOLN

[Series 204: coronal · coronal · 0.50mm/px · 2 of 130 slices shown, 3 images]
[im 44/130  soft-tissue]
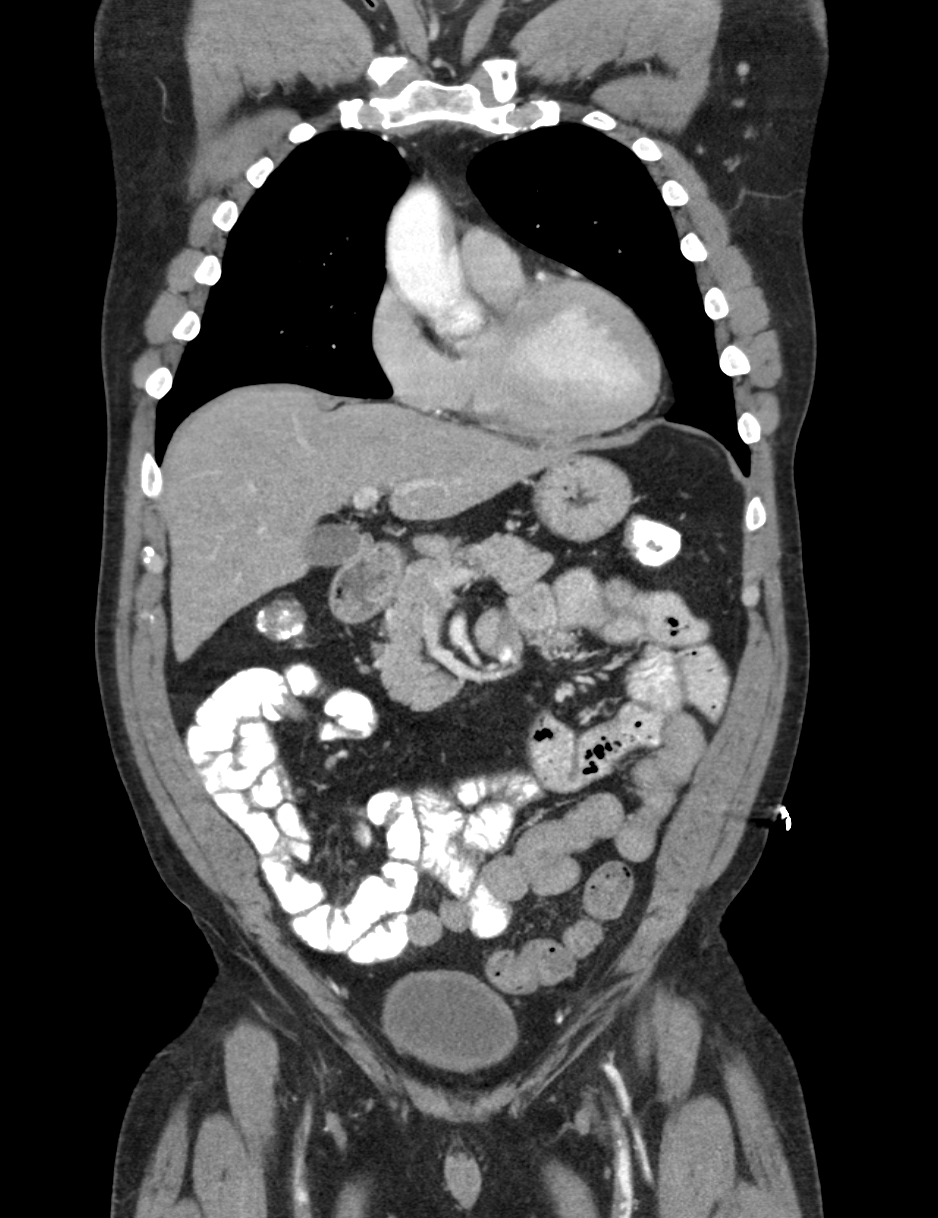
[im 44/130  bone]
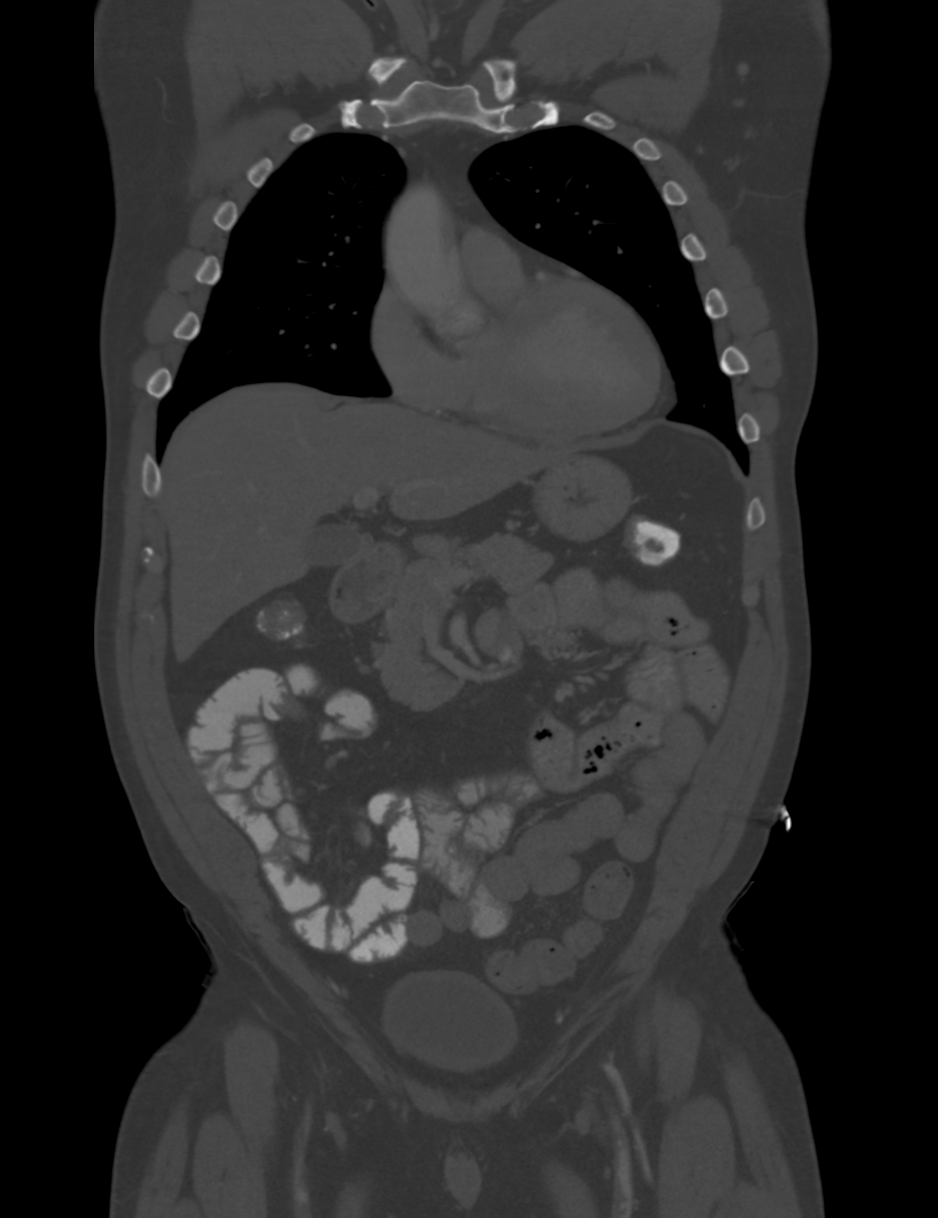
[im 101/130  soft-tissue]
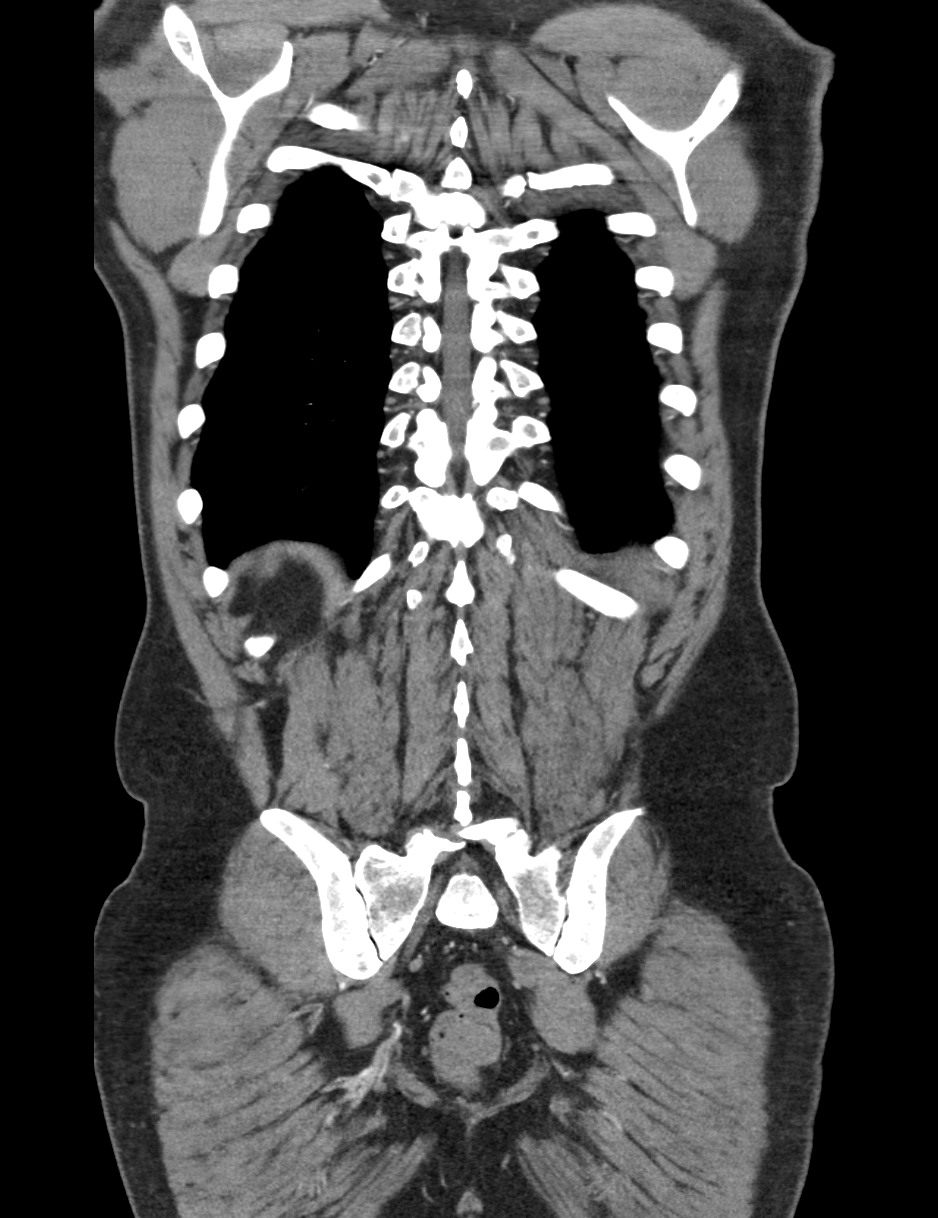

[Series 205: sagittal · sagittal · 0.50mm/px · 1 of 171 slices shown]
[im 57/171  soft-tissue]
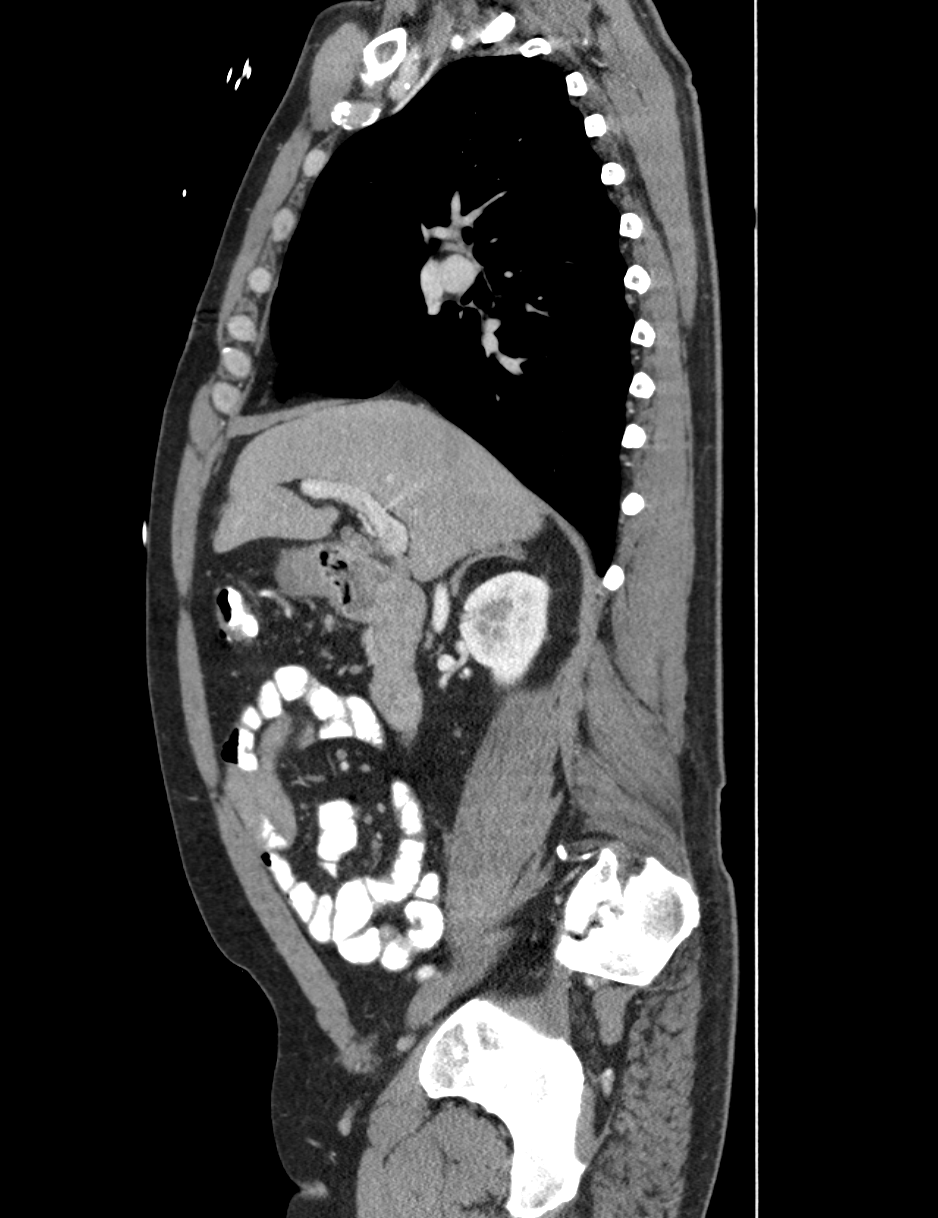

[3 of 46 positions shown; findings below may reference images not displayed]

FINDINGS: CT CHEST FINDINGS

Mediastinum/Nodes: Left common carotid arterial occlusion
reidentified. Heart size is normal. No pericardial effusion. Mild
atheromatous aortic and coronary arterial calcification. No
lymphadenopathy.

Lungs/Pleura: No pulmonary parenchymal nodule, mass, or
consolidation is identified. No specific poorly to the previously
questioned finding, possibly previously representing nipple shadow.
No pleural effusion.

Upper abdomen: Please see dedicated report below.

Musculoskeletal: No acute osseous abnormality.

CT ABDOMEN AND PELVIS FINDINGS

Lower chest:  Please see dedicated report above.

Hepatobiliary: Liver and gallbladder appear normal.

Pancreas: Normal

Spleen: Normal

Adrenals/Urinary Tract: Adrenal glands are unremarkable. Left renal
cortical scarring noted. Right kidney is unremarkable. No
perinephric fluid or hydroureteronephrosis. No radiopaque renal,
ureteral, or bladder calculus.

Stomach/Bowel: Normal-appearing appendix. No bowel wall thickening
or focal segmental dilatation is identified. Colonic diverticuli
noted without evidence for diverticulitis.

Vascular/Lymphatic: Moderate atheromatous aortic calcification
without aneurysm. No lymphadenopathy.

Other: No free air or fluid.

Musculoskeletal: Bilateral L5 pars interarticularis defects are
noted. 1.6 cm anterolisthesis L5 on S1. Remote appearing sclerotic
L5 compression deformity posteriorly approximately 30 percent
vertebral body height. No acute osseous abnormality.
IMPRESSION: No CT correlate to the previously questioned right lower lobe
pulmonary parenchymal nodule, which may have represented nipple
shadow or something overlying the patient.

No acute abnormality within the chest, abdomen, or pelvis.

## 2017-02-09 IMAGING — DX DG CHEST 2V
2 series · 2 of 2 positions shown · non-contrast
Comparison: Chest radiograph performed 01/15/2015, and CTA of the
chest performed 01/17/2015

CLINICAL DATA: Status post CVA. Generalized weakness, acute onset.
Initial encounter.

EXAM:
CHEST  2 VIEW

[chest pa]
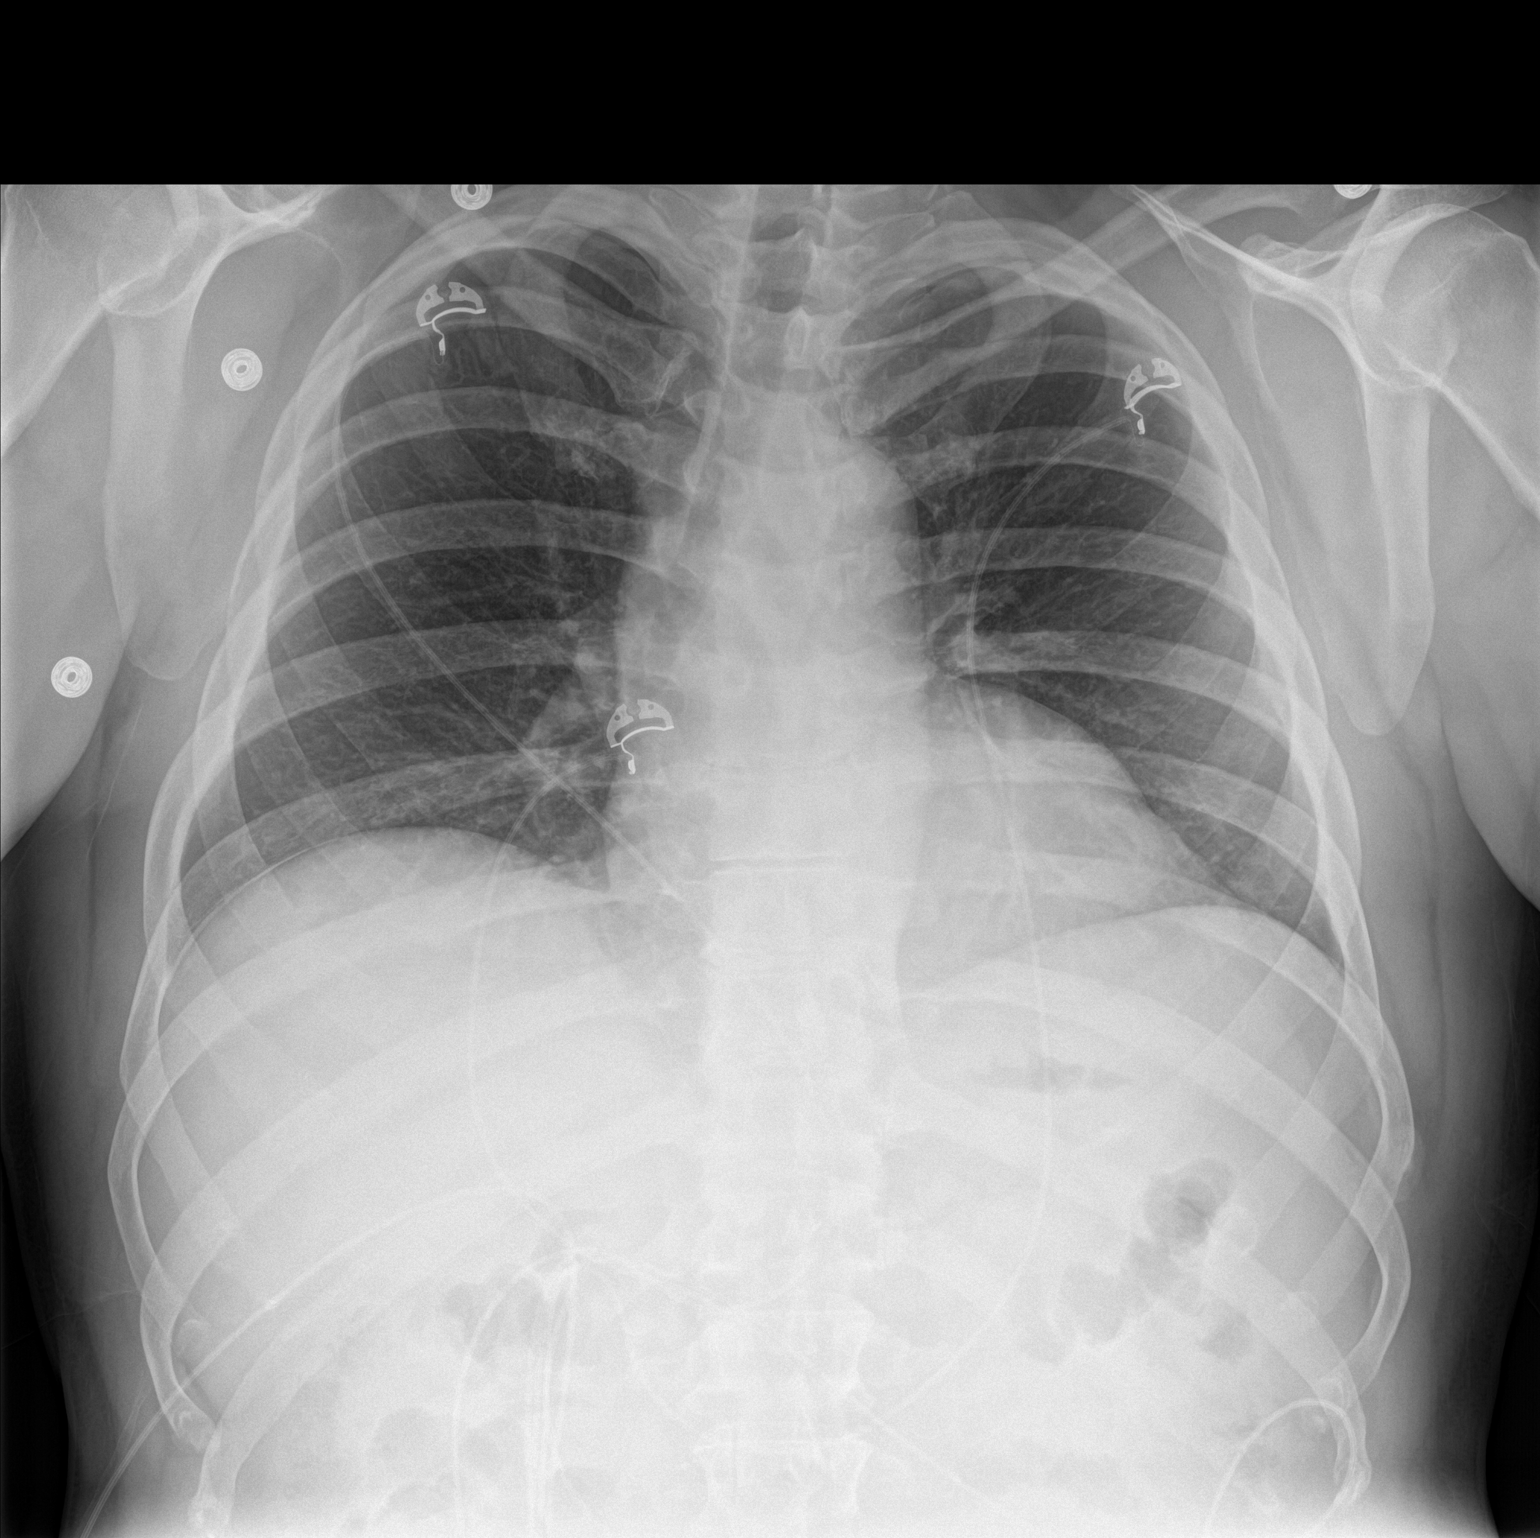

[chest lat]
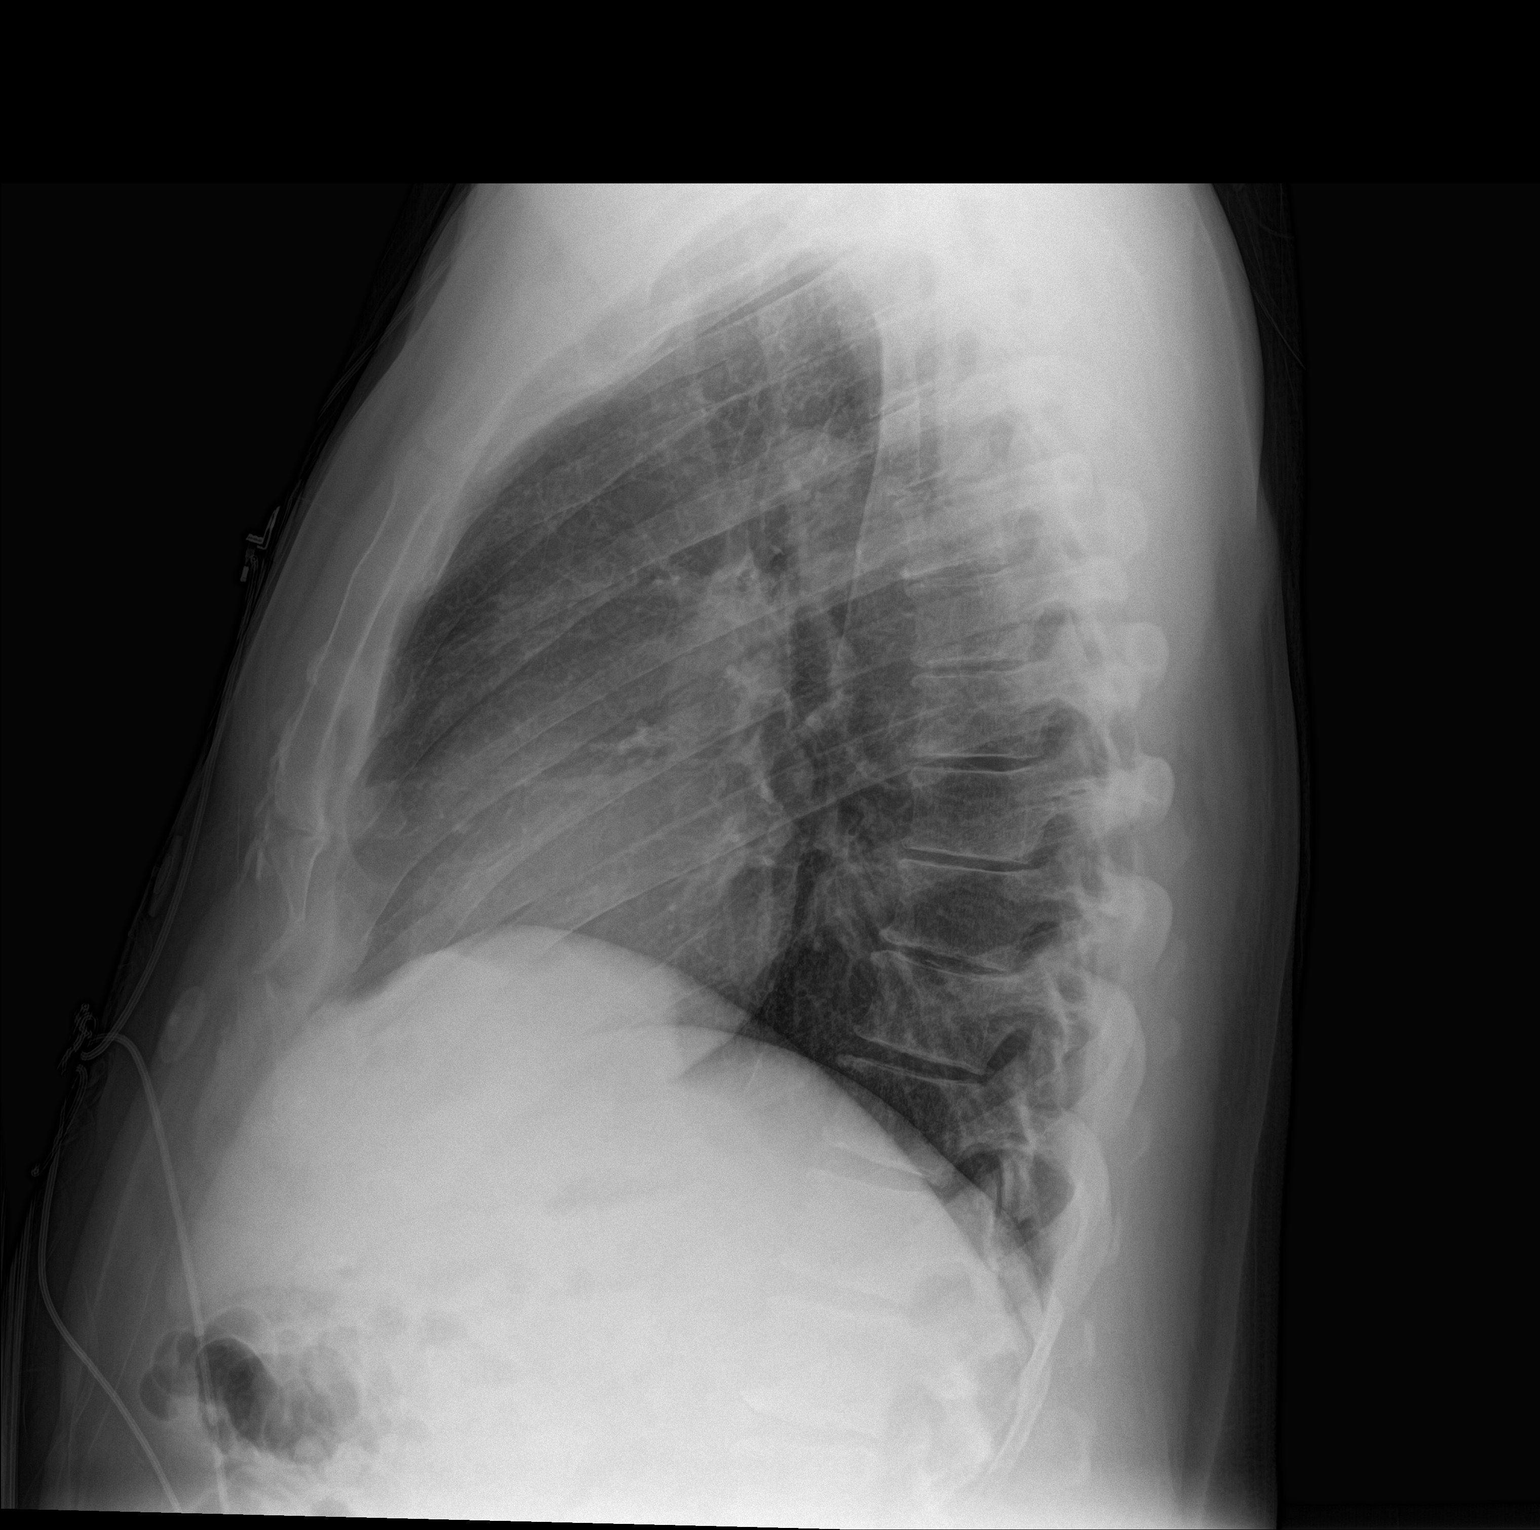

[2 of 2 positions shown; findings below may reference images not displayed]

FINDINGS: The lungs are well-aerated and clear. There is no evidence of focal
opacification, pleural effusion or pneumothorax.

The heart is normal in size; the mediastinal contour is within
normal limits. No acute osseous abnormalities are seen.
IMPRESSION: No acute cardiopulmonary process seen.

## 2017-07-23 DIAGNOSIS — F1210 Cannabis abuse, uncomplicated: Secondary | ICD-10-CM | POA: Insufficient documentation

## 2017-07-23 DIAGNOSIS — F1020 Alcohol dependence, uncomplicated: Secondary | ICD-10-CM | POA: Insufficient documentation

## 2017-07-25 DIAGNOSIS — R7401 Elevation of levels of liver transaminase levels: Secondary | ICD-10-CM | POA: Insufficient documentation

## 2017-07-25 DIAGNOSIS — R740 Nonspecific elevation of levels of transaminase and lactic acid dehydrogenase [LDH]: Secondary | ICD-10-CM

## 2017-07-26 MED ORDER — GEMFIBROZIL 600 MG PO TABS
600.00 mg | ORAL_TABLET | ORAL | Status: DC
Start: 2017-07-26 — End: 2017-07-26

## 2017-07-26 MED ORDER — ATORVASTATIN CALCIUM 40 MG PO TABS
80.00 mg | ORAL_TABLET | ORAL | Status: DC
Start: 2017-07-26 — End: 2017-07-26

## 2017-07-26 MED ORDER — PAROXETINE HCL 10 MG PO TABS
10.00 mg | ORAL_TABLET | ORAL | Status: DC
Start: 2017-07-26 — End: 2017-07-26

## 2017-07-26 MED ORDER — NICOTINE 14 MG/24HR TD PT24
1.00 | MEDICATED_PATCH | TRANSDERMAL | Status: DC
Start: 2017-07-26 — End: 2017-07-26

## 2017-07-26 MED ORDER — METOPROLOL TARTRATE 50 MG PO TABS
50.00 mg | ORAL_TABLET | ORAL | Status: DC
Start: 2017-07-26 — End: 2017-07-26

## 2017-07-26 MED ORDER — MULTI-VITAMINS PO TABS
1.00 | ORAL_TABLET | ORAL | Status: DC
Start: 2017-07-26 — End: 2017-07-26

## 2017-07-26 MED ORDER — ACETAMINOPHEN 325 MG PO TABS
650.00 mg | ORAL_TABLET | ORAL | Status: DC
Start: ? — End: 2017-07-26

## 2017-07-26 MED ORDER — PANTOPRAZOLE SODIUM 40 MG PO TBEC
40.00 mg | DELAYED_RELEASE_TABLET | ORAL | Status: DC
Start: 2017-07-26 — End: 2017-07-26

## 2017-07-26 MED ORDER — NALTREXONE HCL 50 MG PO TABS
50.00 mg | ORAL_TABLET | ORAL | Status: DC
Start: 2017-07-26 — End: 2017-07-26

## 2017-07-26 MED ORDER — METHOCARBAMOL 750 MG PO TABS
1500.00 mg | ORAL_TABLET | ORAL | Status: DC
Start: ? — End: 2017-07-26

## 2017-07-26 MED ORDER — CLONIDINE HCL 0.1 MG PO TABS
.10 mg | ORAL_TABLET | ORAL | Status: DC
Start: ? — End: 2017-07-26

## 2017-07-26 MED ORDER — LORAZEPAM 2 MG/ML IJ SOLN
2.00 mg | INTRAMUSCULAR | Status: DC
Start: ? — End: 2017-07-26

## 2017-07-26 MED ORDER — HALOPERIDOL 5 MG PO TABS
5.00 mg | ORAL_TABLET | ORAL | Status: DC
Start: ? — End: 2017-07-26

## 2017-07-26 MED ORDER — TRAZODONE HCL 100 MG PO TABS
100.00 mg | ORAL_TABLET | ORAL | Status: DC
Start: ? — End: 2017-07-26

## 2017-07-26 MED ORDER — HYDROXYZINE PAMOATE 25 MG PO CAPS
50.00 mg | ORAL_CAPSULE | ORAL | Status: DC
Start: ? — End: 2017-07-26

## 2017-07-26 MED ORDER — PROMETHAZINE HCL 25 MG PO TABS
25.00 mg | ORAL_TABLET | ORAL | Status: DC
Start: ? — End: 2017-07-26

## 2017-07-26 MED ORDER — ASPIRIN EC 81 MG PO TBEC
81.00 mg | DELAYED_RELEASE_TABLET | ORAL | Status: DC
Start: 2017-07-26 — End: 2017-07-26

## 2017-08-01 MED ORDER — HYDRALAZINE HCL 20 MG/ML IJ SOLN
10.00 mg | INTRAMUSCULAR | Status: DC
Start: ? — End: 2017-08-01

## 2017-08-01 MED ORDER — LOPERAMIDE HCL 2 MG PO CAPS
2.00 mg | ORAL_CAPSULE | ORAL | Status: DC
Start: ? — End: 2017-08-01

## 2017-08-01 MED ORDER — FUROSEMIDE 20 MG PO TABS
20.00 mg | ORAL_TABLET | ORAL | Status: DC
Start: 2017-08-02 — End: 2017-08-01

## 2017-08-01 MED ORDER — NICOTINE 7 MG/24HR TD PT24
1.00 | MEDICATED_PATCH | TRANSDERMAL | Status: DC
Start: 2017-08-02 — End: 2017-08-01

## 2017-08-01 MED ORDER — LISINOPRIL 5 MG PO TABS
10.00 mg | ORAL_TABLET | ORAL | Status: DC
Start: 2017-08-02 — End: 2017-08-01

## 2017-08-01 MED ORDER — AZITHROMYCIN 250 MG PO TABS
250.00 mg | ORAL_TABLET | ORAL | Status: DC
Start: 2017-08-02 — End: 2017-08-01

## 2017-08-01 MED ORDER — PANTOPRAZOLE SODIUM 40 MG PO TBEC
40.00 mg | DELAYED_RELEASE_TABLET | ORAL | Status: DC
Start: 2017-08-02 — End: 2017-08-01

## 2017-08-01 MED ORDER — METOPROLOL TARTRATE 25 MG PO TABS
25.00 mg | ORAL_TABLET | ORAL | Status: DC
Start: 2017-08-01 — End: 2017-08-01

## 2017-08-01 MED ORDER — NALTREXONE HCL 50 MG PO TABS
50.00 mg | ORAL_TABLET | ORAL | Status: DC
Start: 2017-08-02 — End: 2017-08-01

## 2017-08-01 MED ORDER — ATORVASTATIN CALCIUM 40 MG PO TABS
80.00 mg | ORAL_TABLET | ORAL | Status: DC
Start: 2017-08-01 — End: 2017-08-01

## 2017-08-01 MED ORDER — GENERIC EXTERNAL MEDICATION
1.00 g | Status: DC
Start: 2017-08-02 — End: 2017-08-01

## 2017-08-08 ENCOUNTER — Other Ambulatory Visit: Payer: Self-pay

## 2017-08-08 ENCOUNTER — Emergency Department (HOSPITAL_BASED_OUTPATIENT_CLINIC_OR_DEPARTMENT_OTHER)
Admission: EM | Admit: 2017-08-08 | Discharge: 2017-08-08 | Disposition: A | Discharge status: Home / Self Care | Payer: 59 | Attending: Emergency Medicine | Admitting: Emergency Medicine

## 2017-08-08 ENCOUNTER — Encounter (HOSPITAL_BASED_OUTPATIENT_CLINIC_OR_DEPARTMENT_OTHER): Payer: Self-pay | Admitting: Emergency Medicine

## 2017-08-08 DIAGNOSIS — F909 Attention-deficit hyperactivity disorder, unspecified type: Secondary | ICD-10-CM | POA: Insufficient documentation

## 2017-08-08 DIAGNOSIS — Z8673 Personal history of transient ischemic attack (TIA), and cerebral infarction without residual deficits: Secondary | ICD-10-CM | POA: Insufficient documentation

## 2017-08-08 DIAGNOSIS — I10 Essential (primary) hypertension: Secondary | ICD-10-CM | POA: Insufficient documentation

## 2017-08-08 DIAGNOSIS — Z79899 Other long term (current) drug therapy: Secondary | ICD-10-CM | POA: Insufficient documentation

## 2017-08-08 DIAGNOSIS — I2510 Atherosclerotic heart disease of native coronary artery without angina pectoris: Secondary | ICD-10-CM | POA: Insufficient documentation

## 2017-08-08 DIAGNOSIS — R55 Syncope and collapse: Secondary | ICD-10-CM

## 2017-08-08 DIAGNOSIS — R251 Tremor, unspecified: Secondary | ICD-10-CM

## 2017-08-08 DIAGNOSIS — Z87891 Personal history of nicotine dependence: Secondary | ICD-10-CM | POA: Insufficient documentation

## 2017-08-08 DIAGNOSIS — Z7982 Long term (current) use of aspirin: Secondary | ICD-10-CM | POA: Insufficient documentation

## 2017-08-08 LAB — COMPREHENSIVE METABOLIC PANEL
ALBUMIN: 3 g/dL — AB (ref 3.5–5.0)
ALT: 20 U/L (ref 17–63)
ANION GAP: 6 (ref 5–15)
AST: 23 U/L (ref 15–41)
Alkaline Phosphatase: 86 U/L (ref 38–126)
BUN: 20 mg/dL (ref 6–20)
CHLORIDE: 102 mmol/L (ref 101–111)
CO2: 30 mmol/L (ref 22–32)
Calcium: 8.7 mg/dL — ABNORMAL LOW (ref 8.9–10.3)
Creatinine, Ser: 1.49 mg/dL — ABNORMAL HIGH (ref 0.61–1.24)
GFR calc Af Amer: 58 mL/min — ABNORMAL LOW (ref >60)
GFR calc non Af Amer: 50 mL/min — ABNORMAL LOW (ref >60)
GLUCOSE: 108 mg/dL — AB (ref 65–99)
POTASSIUM: 4.5 mmol/L (ref 3.5–5.1)
Sodium: 138 mmol/L (ref 135–145)
TOTAL PROTEIN: 6.2 g/dL — AB (ref 6.5–8.1)
Total Bilirubin: 0.3 mg/dL (ref 0.3–1.2)

## 2017-08-08 LAB — CBC WITH DIFFERENTIAL/PLATELET
BASOS ABS: 0.1 10*3/uL (ref 0.0–0.1)
Basophils Relative: 1 %
EOS PCT: 3 %
Eosinophils Absolute: 0.3 10*3/uL (ref 0.0–0.7)
HEMATOCRIT: 29.2 % — AB (ref 39.0–52.0)
HEMOGLOBIN: 9.6 g/dL — AB (ref 13.0–17.0)
LYMPHS ABS: 1.8 10*3/uL (ref 0.7–4.0)
LYMPHS PCT: 21 %
MCH: 32.7 pg (ref 26.0–34.0)
MCHC: 32.9 g/dL (ref 30.0–36.0)
MCV: 99.3 fL (ref 78.0–100.0)
MONOS PCT: 13 %
Monocytes Absolute: 1.1 10*3/uL — ABNORMAL HIGH (ref 0.1–1.0)
NEUTROS ABS: 5.3 10*3/uL (ref 1.7–7.7)
Neutrophils Relative %: 62 %
Platelets: 296 10*3/uL (ref 150–400)
RBC: 2.94 MIL/uL — AB (ref 4.22–5.81)
RDW: 12.2 % (ref 11.5–15.5)
WBC: 8.6 10*3/uL (ref 4.0–10.5)

## 2017-08-08 LAB — URINALYSIS, ROUTINE W REFLEX MICROSCOPIC
Bilirubin Urine: NEGATIVE
GLUCOSE, UA: NEGATIVE mg/dL
Hgb urine dipstick: NEGATIVE
Ketones, ur: NEGATIVE mg/dL
LEUKOCYTES UA: NEGATIVE
NITRITE: NEGATIVE
PH: 6 (ref 5.0–8.0)
Protein, ur: NEGATIVE mg/dL
SPECIFIC GRAVITY, URINE: 1.025 (ref 1.005–1.030)

## 2017-08-08 MED ORDER — SODIUM CHLORIDE 0.9 % IV BOLUS (SEPSIS)
1000.0000 mL | Freq: Once | INTRAVENOUS | Status: AC
  Administered 2017-08-08: 1000 mL via INTRAVENOUS

## 2017-08-08 MED ORDER — DIPHENHYDRAMINE HCL 25 MG PO TABS: 25.0000 mg | ORAL_TABLET | Freq: Every evening | ORAL | 0 refills | Status: DC | PRN

## 2017-08-08 NOTE — ED Triage Notes (Addendum)
Patient states that he started treatment at [facility] last Friday  - the patient reports that his last ETOH drink was about 3 weeks ago. The patient reports that he has started to have fainting spells and passing out about 4 days ago. The patient reports that he " upper body seizures in the afternoon" - Denies any "spells" today reports that Chinita Pester was concerned because they are coming more frequently

## 2017-08-08 NOTE — ED Provider Notes (Signed)
[facility] EMERGENCY DEPARTMENT Provider Note   CSN: [id number] Arrival date & time: 08/08/17  1823     History   Chief Complaint Chief Complaint  Patient presents with  . Near Syncope    HPI Martin Archer is a 56 y.o. male.  HPI  56 year old male sent for evaluation of tremors and near syncope.  [facility] abuse.  Last alcohol use was 17 days ago.  Before that he was binging 24/7 for 6 months.  Patient has a history of on and off alcohol abuse.  The patient was not taking his medicines as prescribed during that time.  Now that he is in rehab he has been on his medicines metoprolol, furosemide, and lisinopril.  He has had some bilateral ankle swelling since being there that he thinks is related to his salt use from the food at the facility.  Over the last 3 days or so, he has become lightheaded often when standing.  He notices it typically early afternoon and thinks it might be related to his medicines that he is taking in the morning.  However essentially he will be sitting down for a long time in a meeting and then stand up and become lightheaded.  He will have tremors in his extremities.  It tends to be worse in the right and sometimes his right side only but often is both upper extremities and sometimes lower extremities.  He is awake and alert during these entire episodes.  Has not been having a headache.  He recently got over an acute pneumonia and finished antibiotics 2 days ago.  He denies current cough, shortness of breath, chest pain, fever, abdominal pain, vomiting.  He has had a couple episodes of loose stools over the last couple days.  He has a history of bilateral carotid artery occlusions, most recently had these evaluated about 3 years ago and is status post bilateral carotid endarterectomies.  He denies any weakness or numbness.  Past Medical History:  Diagnosis Date  . Anemia, unspecified   . Arthritis   .  Diverticulosis of colon (without mention of hemorrhage)   . Gastric ulcer with hemorrhage and obstruction 2011   Dr [facility]  . Hyperlipemia   . Hypertension   . Personal history of colonic polyps 01/30/2010   hyperplastic rectal  . Sleep apnea    on CPAP; [facility]    Patient Active Problem List   Diagnosis Date Noted  . Melena   . Gastric ulcer with hemorrhage   . Right hemiparesis ([place]) 09/18/2015  . Chronic blood loss anemia 09/18/2015  . Hematemesis 09/18/2015  . Wellness examination 06/29/2015  . History of CEA (carotid endarterectomy) 03/25/2015  . HLD (hyperlipidemia) 03/25/2015  . Tobacco use disorder 03/25/2015  . Carotid artery occlusion with infarction ([place]) 03/25/2015  . 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 Stroke ([place])   . Aortic arch atherosclerosis ([place])   . Carotid stenosis 01/16/2015  . Thrombus: BILATERAL EXTERNAL JUGULAR VEINS per CT angio head and neck 01/16/15 01/16/2015  . DVT (deep venous thrombosis) ([place])   . Numbness and tingling of left arm and leg 01/15/2015  . CVA (cerebral infarction)   . Paresthesia 12/08/2014  . 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 ADD (attention deficit disorder) 07/14/2013  . Nicotine dependence 06/18/2013  . Back pain, chronic 06/17/2013  . ACUTE GASTRIC ULCER W/HEMORRHAGE AND OBSTRUCTION 02/28/2010  . PERSONAL HX COLONIC POLYPS 02/28/2010  .  DIVERTICULOSIS, COLON 02/02/2010  . ANEMIA, SEVERE 01/13/2010  . Essential hypertension 01/13/2010  . Sleep apnea 01/13/2010  . Left carotid bruit 01/13/2010  . 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 Mixed hyperlipidemia 01/13/2007    Past Surgical History:  Procedure Laterality Date  . CAROTID ENDARTERECTOMY  11/2006   left Dr Mamie Nick  . ENDARTERECTOMY Right 01/19/2015   Procedure: RIGHT CAROTID ENDARTERECTOMY WITH PATCH ANGIOPLASTY;  Surgeon: Mal Misty, MD;  Location: [facility];  Service: Vascular;  Laterality: Right;  .  ESOPHAGOGASTRODUODENOSCOPY Left 06/17/2013   Procedure: ESOPHAGOGASTRODUODENOSCOPY (EGD);  Surgeon: Arta Silence, MD;  Location: [facility] ENDOSCOPY;  Service: Endoscopy;  Laterality: Left;  . ESOPHAGOGASTRODUODENOSCOPY N/A 09/20/2015   Procedure: ESOPHAGOGASTRODUODENOSCOPY (EGD);  Surgeon: Ladene Artist, MD;  Location: [facility] ENDOSCOPY;  Service: Endoscopy;  Laterality: N/A;  . [place]  . NASAL FRACTURE SURGERY    . TEE WITHOUT CARDIOVERSION N/A 01/17/2015   Procedure: TRANSESOPHAGEAL ECHOCARDIOGRAM (TEE);  Surgeon: Sanda Klein, MD;  Location: [facility] ENDOSCOPY;  Service: Cardiovascular;  Laterality: N/A;  . WISDOM TOOTH EXTRACTION         Home Medications    Prior to Admission medications   Medication Sig Start Date End Date Taking? Authorizing Provider  aspirin EC 81 MG tablet Take 1 tablet (81 mg total) by mouth daily. 09/21/15   Ghimire, Henreitta Leber, MD  atorvastatin (LIPITOR) 80 MG tablet TAKE 1 TABLET(80 MG) BY MOUTH DAILY 01/02/16   Biagio Borg, MD  diphenhydrAMINE (BENADRYL) 25 MG tablet Take 1-2 tablets (25-50 mg total) by mouth at bedtime as needed for sleep. 08/08/17   Sherwood Gambler, MD  fenofibrate 54 MG tablet TAKE 1 TABLET BY MOUTH DAILY 11/18/15   Biagio Borg, MD  ferrous sulfate 325 (65 FE) MG tablet TAKE 1 TABLET BY MOUTH TWICE DAILY WITH A MEAL 11/18/15   Biagio Borg, MD  HYDROcodone-acetaminophen (NORCO/VICODIN) 5-325 MG per tablet Take 1 tablet by mouth every 6 (six) hours as needed for moderate pain. 01/20/15   Ulyses Amor, PA-C  metoprolol tartrate (LOPRESSOR) 25 MG tablet Take 0.5 tablets (12.5 mg total) by mouth 2 (two) times daily. 09/21/15   Ghimire, Henreitta Leber, MD  Multiple Vitamins-Minerals (MULTIVITAL PO) Take 1 tablet by mouth daily.     [provider]  pantoprazole (PROTONIX) 40 MG tablet Take 1 tablet (40 mg total) by mouth 2 (two) times daily. 09/21/15   Ghimire, Henreitta Leber, MD  pantoprazole (PROTONIX) 40 MG tablet TAKE 1 TABLET(40 MG)  BY MOUTH TWICE DAILY 01/11/16   Ladene Artist, MD    Family History Family History  Problem Relation Age of Onset  . Diabetes Father   . Hypertension Father   . Stroke Father        in late 53s  . Heart attack Mother        in 41s  . Heart attack Brother 30  . Diabetes Paternal Grandfather   . Cancer Maternal Grandfather        unknown type  . Heart attack Maternal Grandfather 76  . Stroke Paternal Uncle        in late 17s  . Colon cancer Neg Hx   . Esophageal cancer Neg Hx   . Stomach cancer Neg Hx     Social History Social History   Tobacco Use  . Smoking status: Former Smoker    Packs/day: 1.00    Types: Cigarettes    Last attempt to quit:  12/26/2014    Years since quitting: 2.6  . Smokeless tobacco: Never Used  . Tobacco comment: smoked 1976-2007 & 2009- present , up to 2 ppd, average > 1ppd  Substance Use Topics  . Alcohol use: Yes    Alcohol/week: 0.0 oz    Comment: currently at [facility] for treatment.   . Drug use: No     Allergies   Adderall [amphetamine-dextroamphet er]; Prozac [fluoxetine hcl]; Sertraline hcl; and Other   Review of Systems Review of Systems  Constitutional: Negative for fever.  Respiratory: Negative for cough and shortness of breath.   Cardiovascular: Positive for leg swelling (ankles, bilaterally). Negative for chest pain.  Gastrointestinal: Positive for diarrhea. Negative for abdominal pain, nausea and vomiting.  Genitourinary: Negative for dysuria.  Neurological: Positive for tremors and light-headedness. Negative for syncope.  All other systems reviewed and are negative.    Physical Exam Updated Vital Signs BP (!) 132/108   Pulse 82   Temp 98.5 F (36.9 C) (Rectal)   Resp 11   Ht 5\' 7"  (1.702 m)   Wt 77.1 kg (170 lb)   SpO2 100%   BMI 26.63 kg/m   Physical Exam  Constitutional: He is oriented to person, place, and time. He appears well-developed and well-nourished. No distress.  HENT:  Head: Normocephalic  and atraumatic.  Right Ear: External ear normal.  Left Ear: External ear normal.  Nose: Nose normal.  Eyes: Right eye exhibits no discharge. Left eye exhibits no discharge.  Neck: Neck supple.  Bilateral CEA scars  Cardiovascular: Normal rate, regular rhythm and normal heart sounds.  Pulmonary/Chest: Effort normal and breath sounds normal.  Abdominal: Soft. There is no tenderness.  Musculoskeletal: He exhibits no edema.  Neurological: He is alert and oriented to person, place, and time.  CN 3-12 grossly intact. 5/5 strength in all 4 extremities. Grossly normal sensation. Normal finger to nose. Mild tremor with finger to nose  Skin: Skin is warm and dry. He is not diaphoretic.  Nursing note and vitals reviewed.    ED Treatments / Results  Labs (all labs ordered are listed, but only abnormal results are displayed) Labs Reviewed  COMPREHENSIVE METABOLIC PANEL - Abnormal; Notable for the following components:      Result Value   Glucose, Bld 108 (*)    Creatinine, Ser 1.49 (*)    Calcium 8.7 (*)    Total Protein 6.2 (*)    Albumin 3.0 (*)    GFR calc non Af Amer 50 (*)    GFR calc Af Amer 58 (*)    All other components within normal limits  CBC WITH DIFFERENTIAL/PLATELET - Abnormal; Notable for the following components:   RBC 2.94 (*)    Hemoglobin 9.6 (*)    HCT 29.2 (*)    Monocytes Absolute 1.1 (*)    All other components within normal limits  URINALYSIS, ROUTINE W REFLEX MICROSCOPIC    EKG  EKG Interpretation  Date/Time:  Thursday August 08 2017 20:37:19 EST Ventricular Rate:  83 PR Interval:    QRS Duration: 102 QT Interval:  400 QTC Calculation: 470 R Axis:   49 Text Interpretation:  Sinus rhythm Probable left atrial enlargement T wave changes no longer present compared to 2017 Confirmed by Sherwood Gambler [phone]) on 08/08/2017 9:28:22 PM       Radiology No results found.  Procedures Procedures (including critical care time)  Medications Ordered in  ED Medications  sodium chloride 0.9 % bolus 1,000 mL (0 mLs Intravenous  Stopped 08/08/17 2151)     Initial Impression / Assessment and Plan / ED Course  I have reviewed the triage vital signs and the nursing notes.  Pertinent labs & imaging results that were available during my care of the patient were reviewed by me and considered in my medical decision making (see chart for details).     Patient's workup is unremarkable.  Hemoglobin is stable as well as his electrolytes and creatinine.  Urine is clear.  He had a possible low-grade oral temperature of 100 but rectal temperature is normal.  He displays no infectious signs or symptoms currently.  His symptoms are consistent with orthostasis.  The tremor is likely related to the orthostasis but also from his alcohol abuse.  I do not think he is withdrawing given his benign vital signs and that he is over 2 weeks since his last alcohol use.  I also do not think this is seizures which is what they marked seem to be concerned about.  These are bilateral tremors and only occur when he is getting near syncopal from standing.  Given the bilateral nature, he would need to be losing consciousness for this to be a generalized seizure.  Thus I think, he can reevaluate with his PCP his medicines as well as get his carotids reevaluated as an outpatient.  However no signs of stroke or seizures now.  Neuro exam benign.  Discharge home with return precautions.  Final Clinical Impressions(s) / ED Diagnoses   Final diagnoses:  Near syncope  Tremor    ED Discharge Orders        Ordered    diphenhydrAMINE (BENADRYL) 25 MG tablet  At bedtime PRN     08/08/17 2217       Sherwood Gambler, MD 08/08/17 2327

## 2017-08-08 NOTE — ED Notes (Signed)
pt is in treatement for alcohol dependence.  No ETOH since 11/26.  Pt has had near syncopal episode today when getting up from sitting for an extended period of time.  Pt has had several episodes of near syncope while standing up from sitting in group meetings for an extended period of time.  Pt reports that he has some generalized weakness with this, some blurred vision and dizzy and shaky.  Pt does not have any of these symptoms at this time.  No CP with this.  Pt paperwork states that staff at [facility] where he is getting treatment are concerned about "seizure like activity", pt never lost consciousness, CIWA 0 and pt states that he has tremors when he has theses near syncopal episodes and feels weak. Pt was recently treated for pneumonia but feels better.

## 2017-08-08 NOTE — ED Notes (Signed)
Pt verbalizes understanding of dc instructions and denies any further needs at this time.  He is discharged to the lobby to await pick up from [facility]

## 2017-08-08 NOTE — ED Notes (Signed)
Pt ambulated around the department without difficulty, without assistance, and without dizziness or weakness.

## 2017-09-25 ENCOUNTER — Ambulatory Visit: Payer: Self-pay | Admitting: Internal Medicine

## 2017-09-26 ENCOUNTER — Ambulatory Visit (INDEPENDENT_AMBULATORY_CARE_PROVIDER_SITE_OTHER): Payer: Self-pay | Admitting: Internal Medicine

## 2017-09-26 ENCOUNTER — Encounter: Payer: Self-pay | Admitting: Internal Medicine

## 2017-09-26 ENCOUNTER — Other Ambulatory Visit (INDEPENDENT_AMBULATORY_CARE_PROVIDER_SITE_OTHER): Payer: Self-pay

## 2017-09-26 VITALS — BP 138/84 | HR 67 | Temp 98.7°F | Ht 67.0 in | Wt 183.0 lb

## 2017-09-26 DIAGNOSIS — E785 Hyperlipidemia, unspecified: Secondary | ICD-10-CM

## 2017-09-26 DIAGNOSIS — I10 Essential (primary) hypertension: Secondary | ICD-10-CM

## 2017-09-26 DIAGNOSIS — I63239 Cerebral infarction due to unspecified occlusion or stenosis of unspecified carotid arteries: Secondary | ICD-10-CM

## 2017-09-26 DIAGNOSIS — R55 Syncope and collapse: Secondary | ICD-10-CM

## 2017-09-26 DIAGNOSIS — F1011 Alcohol abuse, in remission: Secondary | ICD-10-CM

## 2017-09-26 DIAGNOSIS — N183 Chronic kidney disease, stage 3 unspecified: Secondary | ICD-10-CM

## 2017-09-26 DIAGNOSIS — N184 Chronic kidney disease, stage 4 (severe): Secondary | ICD-10-CM | POA: Insufficient documentation

## 2017-09-26 LAB — CBC WITH DIFFERENTIAL/PLATELET
BASOS PCT: 0.9 % (ref 0.0–3.0)
Basophils Absolute: 0.1 10*3/uL (ref 0.0–0.1)
EOS PCT: 2.5 % (ref 0.0–5.0)
Eosinophils Absolute: 0.2 10*3/uL (ref 0.0–0.7)
HEMATOCRIT: 35.4 % — AB (ref 39.0–52.0)
HEMOGLOBIN: 12.1 g/dL — AB (ref 13.0–17.0)
LYMPHS PCT: 24 % (ref 12.0–46.0)
Lymphs Abs: 2.1 10*3/uL (ref 0.7–4.0)
MCHC: 34.3 g/dL (ref 30.0–36.0)
MCV: 93.3 fl (ref 78.0–100.0)
Monocytes Absolute: 1 10*3/uL (ref 0.1–1.0)
Monocytes Relative: 11.5 % (ref 3.0–12.0)
Neutro Abs: 5.3 10*3/uL (ref 1.4–7.7)
Neutrophils Relative %: 61.1 % (ref 43.0–77.0)
Platelets: 293 10*3/uL (ref 150.0–400.0)
RBC: 3.79 Mil/uL — ABNORMAL LOW (ref 4.22–5.81)
RDW: 13.9 % (ref 11.5–15.5)
WBC: 8.7 10*3/uL (ref 4.0–10.5)

## 2017-09-26 MED ORDER — ATORVASTATIN CALCIUM 80 MG PO TABS: ORAL_TABLET | ORAL | 3 refills | Status: DC

## 2017-09-26 MED ORDER — NALTREXONE HCL 50 MG PO TABS
50.0000 mg | ORAL_TABLET | Freq: Every day | ORAL | 3 refills | Status: DC
Start: 2017-09-26 — End: 2018-11-07

## 2017-09-26 MED ORDER — PAROXETINE HCL 20 MG PO TABS: 20.0000 mg | ORAL_TABLET | Freq: Every day | ORAL | 3 refills | Status: DC

## 2017-09-26 MED ORDER — METOPROLOL TARTRATE 25 MG PO TABS: 12.5000 mg | ORAL_TABLET | Freq: Two times a day (BID) | ORAL | 3 refills | Status: DC

## 2017-09-26 MED ORDER — PANTOPRAZOLE SODIUM 40 MG PO TBEC
DELAYED_RELEASE_TABLET | ORAL | 3 refills | Status: DC
Start: 2017-09-26 — End: 2018-11-07

## 2017-09-26 MED ORDER — FENOFIBRATE 54 MG PO TABS: 54.0000 mg | ORAL_TABLET | Freq: Every day | ORAL | 3 refills | Status: DC

## 2017-09-26 MED ORDER — GUAIFENESIN ER 600 MG PO TB12: 1200.0000 mg | ORAL_TABLET | Freq: Two times a day (BID) | ORAL | 1 refills | Status: DC | PRN

## 2017-09-26 MED ORDER — FUROSEMIDE 20 MG PO TABS: 20.0000 mg | ORAL_TABLET | Freq: Every day | ORAL | 3 refills | Status: DC

## 2017-09-26 MED ORDER — LISINOPRIL 10 MG PO TABS: 10.0000 mg | ORAL_TABLET | Freq: Every day | ORAL | 3 refills | Status: DC

## 2017-09-26 NOTE — Patient Instructions (Signed)
OK to continue all current medications  You will be contacted regarding the referral for: carotid ultrasound  Please continue all other medications as before, and refills have been done if requested.  Please have the pharmacy call with any other refills you may need.  Please continue your efforts at being more active, low cholesterol diet, and weight control.  Please keep your appointments with your specialists as you may have planned  Please go to the LAB in the Basement (turn left off the elevator) for the tests to be done today  You will be contacted by phone if any changes need to be made immediately.  Otherwise, you will receive a letter about your results with an explanation, but please check with MyChart first.  Please remember to sign up for MyChart if you have not done so, as this will be important to you in the future with finding out test results, communicating by private email, and scheduling acute appointments online when needed.  Please return in 3 months, or sooner if needed

## 2017-09-26 NOTE — Assessment & Plan Note (Signed)
stable overall by history and exam, recent data reviewed with pt, and pt to continue medical treatment as before,  to f/u any worsening symptoms or concerns, for f/u lab today per pt request

## 2017-09-26 NOTE — Assessment & Plan Note (Signed)
Lab Results  Component Value Date   LDLCALC 67 09/30/2015  to cont statin, for f/u labs today

## 2017-09-26 NOTE — Assessment & Plan Note (Signed)
Stable per pt, to finish rehab as planned

## 2017-09-26 NOTE — Assessment & Plan Note (Signed)
stable overall by history and exam, recent data reviewed with pt, and pt to continue medical treatment as before,  to f/u any worsening symptoms or concerns BP Readings from Last 3 Encounters:  09/26/17 138/84  08/08/17 (!) 132/108  12/23/15 104/76

## 2017-09-26 NOTE — Assessment & Plan Note (Addendum)
Asympt, cont asa and statin, for f/u carotid duplex  Note:  Total time for pt hx, exam, review of record with pt in the room, determination of diagnoses and plan for further eval and tx is > 40 min, with over 50% spent in coordination and counseling of patient including the differential dx, tx, further evaluation and other management of carotid disease, HTN, HLD, Alcoholism, CKD, recent syncope

## 2017-09-26 NOTE — Progress Notes (Signed)
Subjective:    Patient ID: Martin Archer, male    DOB: 12-05-1960, 57 y.o.   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  Plans to continue to not drink.  After was hospd jan 22-25 with syncope and Pna prior to the detox, then to rehab, which may last another 2-3 wks per pt.  Throughout has had high and low BP  Still having some tremors now 2 mo into rehab, not likely ETOH related he thinks. Has had increased antiHTN meds given, including BB and ACE start; Pt denies new neurological symptoms such as new headache, or facial or extremity weakness or numbness  Is s/p CEA and due for carotid duplex f/u.   Pt denies fever, wt loss, night sweats, loss of appetite, or other constitutional symptoms   Past Medical History:  Diagnosis Date  . Anemia, unspecified   . Arthritis   . Diverticulosis of colon (without mention of hemorrhage)   . Gastric ulcer with hemorrhage and obstruction 2011   Dr [facility]  . Hyperlipemia   . Hypertension   . Personal history of colonic polyps 01/30/2010   hyperplastic rectal  . Sleep apnea    on CPAP; [facility]   Past Surgical History:  Procedure Laterality Date  . CAROTID ENDARTERECTOMY  11/2006   left Dr Mamie Nick  . ENDARTERECTOMY Right 01/19/2015   Procedure: RIGHT CAROTID ENDARTERECTOMY WITH PATCH ANGIOPLASTY;  Surgeon: Mal Misty, MD;  Location: [facility];  Service: Vascular;  Laterality: Right;  . ESOPHAGOGASTRODUODENOSCOPY Left 06/17/2013   Procedure: ESOPHAGOGASTRODUODENOSCOPY (EGD);  Surgeon: Arta Silence, MD;  Location: [facility] ENDOSCOPY;  Service: Endoscopy;  Laterality: Left;  . ESOPHAGOGASTRODUODENOSCOPY N/A 09/20/2015   Procedure:  ESOPHAGOGASTRODUODENOSCOPY (EGD);  Surgeon: Ladene Artist, MD;  Location: [facility] ENDOSCOPY;  Service: Endoscopy;  Laterality: N/A;  . [place]  . NASAL FRACTURE SURGERY    . TEE WITHOUT CARDIOVERSION N/A 01/17/2015   Procedure: TRANSESOPHAGEAL ECHOCARDIOGRAM (TEE);  Surgeon: Sanda Klein, MD;  Location: [facility] ENDOSCOPY;  Service: Cardiovascular;  Laterality: N/A;  . WISDOM TOOTH EXTRACTION      reports that he quit smoking about 2 years ago. His smoking use included cigarettes. He smoked 1.00 pack per day. he has never used smokeless tobacco. He reports that he drinks alcohol. He reports that he does not use drugs. family history includes Cancer in his maternal grandfather; Diabetes in his father and paternal grandfather; Heart attack in his mother; Heart attack (age of onset: 26) in his brother; Heart attack (age of onset: 71) in his maternal grandfather; Hypertension in his father; Stroke in his father and paternal uncle. Allergies  Allergen Reactions  . Adderall [Amphetamine-Dextroamphet Er] Diarrhea  . Prozac [Fluoxetine Hcl] Diarrhea  . Sertraline Hcl Diarrhea  . Other Diarrhea    Advil, ibuprofen, tylenol, (excedrin is OK)   Current Outpatient Medications on File Prior to Visit  Medication Sig Dispense Refill  . aspirin EC 81 MG tablet Take 1 tablet (81 mg total) by mouth daily. 30 tablet 0  . diphenhydrAMINE (BENADRYL) 25 MG tablet Take 1-2 tablets (25-50 mg total) by mouth at bedtime as needed for sleep. 20 tablet  0  . ferrous sulfate 325 (65 FE) MG tablet TAKE 1 TABLET BY MOUTH TWICE DAILY WITH A MEAL 90 tablet 1  . HYDROcodone-acetaminophen (NORCO/VICODIN) 5-325 MG per tablet Take 1 tablet by mouth every 6 (six) hours as needed for moderate pain. 30 tablet 0  . Multiple Vitamins-Minerals (MULTIVITAL PO) Take 1 tablet by mouth daily.      No current facility-administered medications on file prior to visit.    Review of Systems  Constitutional: Negative for other  unusual diaphoresis or sweats HENT: Negative for ear discharge or swelling Eyes: Negative for other worsening visual disturbances Respiratory: Negative for stridor or other swelling  Gastrointestinal: Negative for worsening distension or other blood Genitourinary: Negative for retention or other urinary change Musculoskeletal: Negative for other MSK pain or swelling Skin: Negative for color change or other new lesions Neurological: Negative for worsening tremors and other numbness  Psychiatric/Behavioral: Negative for worsening agitation or other fatigue All other system neg per pt    Objective:   Physical Exam BP 138/84   Pulse 67   Temp 98.7 F (37.1 C) (Oral)   Ht 5\' 7"  (1.702 m)   Wt 183 lb (83 kg)   SpO2 98%   BMI 28.66 kg/m  VS noted, not ill appearing Constitutional: Pt appears in NAD HENT: Head: NCAT.  Right Ear: External ear normal.  Left Ear: External ear normal.  Eyes: . Pupils are equal, round, and reactive to light. Conjunctivae and EOM are normal Nose: without d/c or deformity Neck: Neck supple. Gross normal ROM Cardiovascular: Normal rate and regular rhythm.   Pulmonary/Chest: Effort normal and breath sounds without rales or wheezing.  Abd:  Soft, NT, ND, + BS, no organomegaly Neurological: Pt is alert. At baseline orientation, motor grossly intact, no tremors noted Skin: Skin is warm. No rashes, other new lesions, no LE edema Psychiatric: Pt behavior is normal without agitation  No other exam findings Lab Results  Component Value Date   WBC 8.6 08/08/2017   HGB 9.6 (L) 08/08/2017   HCT 29.2 (L) 08/08/2017   PLT 296 08/08/2017   GLUCOSE 108 (H) 08/08/2017   CHOL 133 09/30/2015   TRIG 148.0 09/30/2015   HDL 36.70 (L) 09/30/2015   LDLDIRECT 109.0 06/30/2015   LDLCALC 67 09/30/2015   ALT 20 08/08/2017   AST 23 08/08/2017   NA 138 08/08/2017   K 4.5 08/08/2017   CL 102 08/08/2017   CREATININE 1.49 (H) 08/08/2017   BUN 20 08/08/2017   CO2 30  08/08/2017   TSH 1.423 09/18/2015   PSA 0.81 06/30/2015   INR 1.07 09/18/2015   HGBA1C 5.5 09/19/2015   MICROALBUR 0.8 07/15/2006        Assessment & Plan:

## 2017-09-26 NOTE — Assessment & Plan Note (Signed)
Etiology unclear, cont to follow

## 2017-09-27 LAB — LIPID PANEL
CHOLESTEROL: 171 mg/dL (ref 0–200)
HDL: 47 mg/dL (ref >39.00)
NonHDL: 123.99
Total CHOL/HDL Ratio: 4
Triglycerides: 298 mg/dL — ABNORMAL HIGH (ref 0.0–149.0)
VLDL: 59.6 mg/dL — ABNORMAL HIGH (ref 0.0–40.0)

## 2017-09-27 LAB — BASIC METABOLIC PANEL
BUN: 26 mg/dL — AB (ref 6–23)
CO2: 28 mEq/L (ref 19–32)
Calcium: 9.5 mg/dL (ref 8.4–10.5)
Chloride: 102 mEq/L (ref 96–112)
Creatinine, Ser: 2.08 mg/dL — ABNORMAL HIGH (ref 0.40–1.50)
GFR: 34.95 mL/min — AB (ref >60.00)
Glucose, Bld: 96 mg/dL (ref 70–99)
POTASSIUM: 4.4 meq/L (ref 3.5–5.1)
SODIUM: 141 meq/L (ref 135–145)

## 2017-09-27 LAB — HEPATIC FUNCTION PANEL
ALT: 23 U/L (ref 0–53)
AST: 22 U/L (ref 0–37)
Albumin: 4.4 g/dL (ref 3.5–5.2)
Alkaline Phosphatase: 76 U/L (ref 39–117)
Bilirubin, Direct: 0 mg/dL (ref 0.0–0.3)
TOTAL PROTEIN: 7.4 g/dL (ref 6.0–8.3)
Total Bilirubin: 0.3 mg/dL (ref 0.2–1.2)

## 2017-09-27 LAB — LDL CHOLESTEROL, DIRECT: Direct LDL: 90 mg/dL

## 2017-09-27 LAB — TSH: TSH: 3.05 u[IU]/mL (ref 0.35–4.50)

## 2017-09-28 ENCOUNTER — Other Ambulatory Visit: Payer: Self-pay | Admitting: Internal Medicine

## 2017-09-28 DIAGNOSIS — N179 Acute kidney failure, unspecified: Secondary | ICD-10-CM

## 2017-09-30 ENCOUNTER — Telehealth: Payer: Self-pay

## 2017-09-30 ENCOUNTER — Telehealth: Payer: Self-pay | Admitting: Internal Medicine

## 2017-09-30 MED ORDER — FERROUS SULFATE 325 (65 FE) MG PO TABS: ORAL_TABLET | ORAL | 1 refills | Status: DC

## 2017-09-30 NOTE — Telephone Encounter (Signed)
Copied from [place]. Topic: Quick Communication - Rx Refill/Question >> Sep 30, 2017  1:58 PM Margot Ables wrote: Martin Archer name: Martin Archer w/[facility] Can be reached: [phone]  Medication: Ferrous Sulfate 325mg  2/day 90 day supply w/refills - pt has a couple days left - send to updated pharmacy Has the patient contacted their pharmacy? Yes.  Not sent in Preferred Pharmacy (with phone number or street name): [facility], [place] - [id number] [phone] (Phone) [phone] (Fax)  Agent: Please be advised that RX refills may take up to 3 business days. We ask that you follow-up with your pharmacy.

## 2017-09-30 NOTE — Telephone Encounter (Signed)
-----   Message from Biagio Borg, MD sent at 09/28/2017  2:10 PM EST ----- Left message on MyChart, pt to cont same tx except  The test results show that your current treatment is OK, except the kidney slowing has become worse since your last function at the end of your most recent hospital stay.  Please HOLD on taking your lasix (furosemide) for total 3 days and then return for a blood test to recheck the kidney function.  You should also hear from the office about this.    Sherryl Valido to please inform pt, I will do order for next blood test

## 2017-09-30 NOTE — Telephone Encounter (Signed)
Called pt, LVM.   CRM created.  

## 2017-09-30 NOTE — Telephone Encounter (Signed)
Reviewed chart pt is up-to-date sent refills to [facility].../lmb  

## 2017-10-08 ENCOUNTER — Other Ambulatory Visit: Payer: Self-pay

## 2017-10-08 ENCOUNTER — Emergency Department (HOSPITAL_BASED_OUTPATIENT_CLINIC_OR_DEPARTMENT_OTHER)
Admission: EM | Admit: 2017-10-08 | Discharge: 2017-10-08 | Disposition: A | Discharge status: Home / Self Care | Payer: Self-pay | Attending: Emergency Medicine | Admitting: Emergency Medicine

## 2017-10-08 ENCOUNTER — Encounter (HOSPITAL_BASED_OUTPATIENT_CLINIC_OR_DEPARTMENT_OTHER): Payer: Self-pay | Admitting: Emergency Medicine

## 2017-10-08 DIAGNOSIS — Z87891 Personal history of nicotine dependence: Secondary | ICD-10-CM | POA: Insufficient documentation

## 2017-10-08 DIAGNOSIS — Z79899 Other long term (current) drug therapy: Secondary | ICD-10-CM | POA: Insufficient documentation

## 2017-10-08 DIAGNOSIS — H1032 Unspecified acute conjunctivitis, left eye: Secondary | ICD-10-CM | POA: Insufficient documentation

## 2017-10-08 DIAGNOSIS — Z7982 Long term (current) use of aspirin: Secondary | ICD-10-CM | POA: Insufficient documentation

## 2017-10-08 DIAGNOSIS — N183 Chronic kidney disease, stage 3 (moderate): Secondary | ICD-10-CM | POA: Insufficient documentation

## 2017-10-08 DIAGNOSIS — I129 Hypertensive chronic kidney disease with stage 1 through stage 4 chronic kidney disease, or unspecified chronic kidney disease: Secondary | ICD-10-CM | POA: Insufficient documentation

## 2017-10-08 DIAGNOSIS — Z8673 Personal history of transient ischemic attack (TIA), and cerebral infarction without residual deficits: Secondary | ICD-10-CM | POA: Insufficient documentation

## 2017-10-08 MED ORDER — ERYTHROMYCIN 5 MG/GM OP OINT: TOPICAL_OINTMENT | OPHTHALMIC | 0 refills | Status: DC

## 2017-10-08 NOTE — ED Provider Notes (Signed)
[facility] EMERGENCY DEPARTMENT Provider Note   CSN: [id number] Arrival date & time: 10/08/17  1929     History   Chief Complaint Chief Complaint  Patient presents with  . Eye Problem    HPI Martin Archer is a 57 y.o. male with a past medical history of hypertension, who presents to ED for evaluation of left eye redness, purulent drainage.  He has been having URI symptoms including dry cough, nasal congestion, rhinorrhea for the past 3 days.  Sick contacts at home with similar symptoms.  He has not tried any over-the-counter eyedrops to help with symptoms.  Patient does not wear contact lenses vision.  He denies any pain with EOMs, fever, swelling around eye, foreign body sensation, trauma to area, chest pain, headache, shortness of breath.  He reports compliance with his home blood pressure medication.  HPI  Past Medical History:  Diagnosis Date  . Anemia, unspecified   . Arthritis   . Diverticulosis of colon (without mention of hemorrhage)   . Gastric ulcer with hemorrhage and obstruction 2011   Dr [facility]  . Hyperlipemia   . Hypertension   . Personal history of colonic polyps 01/30/2010   hyperplastic rectal  . Sleep apnea    on CPAP; [facility]    Patient Active Problem List   Diagnosis Date Noted  . CKD (chronic kidney disease) stage 3, GFR 30-59 ml/min (HCC) 09/26/2017  . Syncope 09/26/2017  . Melena   . Gastric ulcer with hemorrhage   . Right hemiparesis ([place]) 09/18/2015  . Chronic blood loss anemia 09/18/2015  . Hematemesis 09/18/2015  . Wellness examination 06/29/2015  . History of CEA (carotid endarterectomy) 03/25/2015  . HLD (hyperlipidemia) 03/25/2015  . Tobacco use disorder 03/25/2015  . Carotid artery occlusion with infarction ([place]) 03/25/2015  . Carotid aneurysm, right ([place]) 01/19/2015  . Stroke ([place])   . Aortic arch atherosclerosis ([place])   . Carotid stenosis 01/16/2015  . Thrombus: BILATERAL EXTERNAL  JUGULAR VEINS per CT angio head and neck 01/16/15 01/16/2015  . DVT (deep venous thrombosis) ([place])   . Numbness and tingling of left arm and leg 01/15/2015  . CVA (cerebral infarction)   . Paresthesia 12/08/2014  . 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 ADD (attention deficit disorder) 07/14/2013  . Nicotine dependence 06/18/2013  . Back pain, chronic 06/17/2013  . ACUTE GASTRIC ULCER W/HEMORRHAGE AND OBSTRUCTION 02/28/2010  . PERSONAL HX COLONIC POLYPS 02/28/2010  . DIVERTICULOSIS, COLON 02/02/2010  . ANEMIA, SEVERE 01/13/2010  . Essential hypertension 01/13/2010  . Sleep apnea 01/13/2010  . Left carotid bruit 01/13/2010  . 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 CAROTID ENDARTERECTOMY  11/2006   left Dr Mamie Nick  . ENDARTERECTOMY Right 01/19/2015   Procedure: RIGHT CAROTID ENDARTERECTOMY WITH PATCH ANGIOPLASTY;  Surgeon: Mal Misty, MD;  Location: [facility];  Service: Vascular;  Laterality: Right;  . ESOPHAGOGASTRODUODENOSCOPY Left 06/17/2013   Procedure: ESOPHAGOGASTRODUODENOSCOPY (EGD);  Surgeon: Arta Silence, MD;  Location: [facility] ENDOSCOPY;  Service: Endoscopy;  Laterality: Left;  . ESOPHAGOGASTRODUODENOSCOPY N/A 09/20/2015   Procedure: ESOPHAGOGASTRODUODENOSCOPY (EGD);  Surgeon: Ladene Artist, MD;  Location: [facility] ENDOSCOPY;  Service: Endoscopy;  Laterality: N/A;  . [place]  . NASAL FRACTURE SURGERY    . TEE WITHOUT CARDIOVERSION N/A 01/17/2015   Procedure: TRANSESOPHAGEAL ECHOCARDIOGRAM (TEE);  Surgeon: Sanda [place], MD;  Location: [place];  Service: Cardiovascular;  Laterality: N/A;  .  WISDOM TOOTH EXTRACTION         Home Medications    Prior to Admission medications   Medication Sig Start Date End Date Taking? Authorizing Provider  aspirin EC 81 MG tablet Take 1 tablet (81 mg total) by mouth daily. 09/21/15   Ghimire, Henreitta Leber, MD  atorvastatin (LIPITOR) 80 MG tablet TAKE 1 TABLET(80  MG) BY MOUTH DAILY 09/26/17   Biagio Borg, MD  diphenhydrAMINE (BENADRYL) 25 MG tablet Take 1-2 tablets (25-50 mg total) by mouth at bedtime as needed for sleep. 08/08/17   Sherwood Gambler, MD  erythromycin ophthalmic ointment Place a 1/2 inch ribbon of ointment into the lower eyelid. 10/08/17   Cymone Yeske, PA-C  fenofibrate 54 MG tablet Take 1 tablet (54 mg total) by mouth daily. 09/26/17   Biagio Borg, MD  ferrous sulfate 325 (65 FE) MG tablet TAKE 1 TABLET BY MOUTH TWICE DAILY WITH A MEAL 09/30/17   Biagio Borg, MD  furosemide (LASIX) 20 MG tablet Take 1 tablet (20 mg total) by mouth daily. 09/26/17   Biagio Borg, MD  guaiFENesin (MUCINEX) 600 MG 12 hr tablet Take 2 tablets (1,200 mg total) by mouth 2 (two) times daily as needed. 09/26/17   Biagio Borg, MD  HYDROcodone-acetaminophen (NORCO/VICODIN) 5-325 MG per tablet Take 1 tablet by mouth every 6 (six) hours as needed for moderate pain. 01/20/15   Ulyses Amor, PA-C  lisinopril (PRINIVIL,ZESTRIL) 10 MG tablet Take 1 tablet (10 mg total) by mouth daily. 09/26/17   Biagio Borg, MD  metoprolol tartrate (LOPRESSOR) 25 MG tablet Take 0.5 tablets (12.5 mg total) by mouth 2 (two) times daily. 09/26/17   Biagio Borg, MD  Multiple Vitamins-Minerals (MULTIVITAL PO) Take 1 tablet by mouth daily.     [provider]  naltrexone (DEPADE) 50 MG tablet Take 1 tablet (50 mg total) by mouth daily. 09/26/17   Biagio Borg, MD  pantoprazole (PROTONIX) 40 MG tablet TAKE 1 TABLET(40 MG) BY MOUTH TWICE DAILY 09/26/17   Biagio Borg, MD  PARoxetine (PAXIL) 20 MG tablet Take 1 tablet (20 mg total) by mouth daily. 09/26/17   Biagio Borg, MD    Family History Family History  Problem Relation Age of Onset  . Diabetes Father   . Hypertension Father   . Stroke Father        in late 50s  . Heart attack Mother        in 40s  . Heart attack Brother 58  . Diabetes Paternal Grandfather   . Cancer Maternal Grandfather        unknown type  . Heart  attack Maternal Grandfather 76  . Stroke Paternal Uncle        in late 84s  . Colon cancer Neg Hx   . Esophageal cancer Neg Hx   . Stomach cancer Neg Hx     Social History Social History   Tobacco Use  . Smoking status: Former Smoker    Packs/day: 1.00    Types: Cigarettes    Last attempt to quit: 12/26/2014    Years since quitting: 2.7  . Smokeless tobacco: Never Used  . Tobacco comment: smoked 1976-2007 & 2009- present , up to 2 ppd, average > 1ppd  Substance Use Topics  . Alcohol use: Yes    Alcohol/week: 0.0 oz    Comment: currently at [facility] for treatment.   . Drug use: No     Allergies  Adderall [amphetamine-dextroamphet er]; Prozac [fluoxetine hcl]; Sertraline hcl; and Other   Review of Systems Review of Systems  Constitutional: Negative for chills and fever.  HENT: Positive for congestion and rhinorrhea.   Eyes: Positive for discharge, redness and itching. Negative for photophobia, pain and visual disturbance.  Respiratory: Positive for cough.      Physical Exam Updated Vital Signs BP (!) 144/102   Pulse 86   Temp 98.5 F (36.9 C) (Oral)   Resp 20   Ht 5\' 8"  (1.727 m)   Wt 81.6 kg (180 lb)   SpO2 100%   BMI 27.37 kg/m   Physical Exam  Constitutional: He appears well-developed and well-nourished. No distress.  Nontoxic appearing and in no acute distress.  HENT:  Head: Normocephalic and atraumatic.  Right Ear: Tympanic membrane normal.  Left Ear: Tympanic membrane normal.  Nose: Rhinorrhea present.  Mouth/Throat: No posterior oropharyngeal erythema. No tonsillar exudate.  Eyes: EOM are normal. Pupils are equal, round, and reactive to light. Left eye exhibits discharge. Left conjunctiva is injected. No scleral icterus. Right eye exhibits normal extraocular motion. Left eye exhibits normal extraocular motion.  Left eye with injected conjunctiva, no eyelid swelling or erythema or tenderness to palpation.  Mild clear tearful drainage noted.  No  foreign bodies noted.  No pain with EOMs.  No chemosis, proptosis, or consensual photophobia.   Neck: Normal range of motion.  Cardiovascular: Normal rate, regular rhythm and normal heart sounds.  Pulmonary/Chest: Effort normal and breath sounds normal. No respiratory distress.  Neurological: He is alert.  Skin: No rash noted. He is not diaphoretic.  Psychiatric: He has a normal mood and affect.  Nursing note and vitals reviewed.    ED Treatments / Results  Labs (all labs ordered are listed, but only abnormal results are displayed) Labs Reviewed - No data to display  EKG  EKG Interpretation None       Radiology No results found.  Procedures Procedures (including critical care time)  Medications Ordered in ED Medications - No data to display   Initial Impression / Assessment and Plan / ED Course  I have reviewed the triage vital signs and the nursing notes.  Pertinent labs & imaging results that were available during my care of the patient were reviewed by me and considered in my medical decision making (see chart for details).     Patient presents to ED for evaluation of left eye redness, itching and drainage since this morning.  He has been recovering from a URI the past few days.  He denies any changes in vision, foreign body duration, swelling around eye, fevers, trauma to area.  He does not wear contact lenses.  On physical exam there is evidence of conjunctivitis on the left eye with purulent drainage noted as well as conjunctival injection.  Will treat with antibiotic ointment and advised him to follow-up with specialist for further evaluation if symptoms persist. Patient's blood pressure initially elevated in the emergency department today. Patient denies headache, change in vision, numbness, weakness, chest pain, dyspnea, dizziness, or lightheadedness therefore doubt hypertensive emergency. Discussed elevated blood pressure with the patient and the need for primary  care follow up with potential need to initiate or change antihypertensive medications and or for further evaluation. Discussed return precaution signs/symptoms for hypertensive emergency as listed above with the patient.  Strict return precautions given.  Portions of this note were generated with [employer]. Dictation errors may occur despite best attempts at proofreading.   Final  Clinical Impressions(s) / ED Diagnoses   Final diagnoses:  Acute conjunctivitis of left eye, unspecified acute conjunctivitis type    ED Discharge Orders        Ordered    erythromycin ophthalmic ointment     10/08/17 2232       Delia Heady, PA-C 10/08/17 2233    Orlie Dakin, MD 10/09/17 [phone]

## 2017-10-08 NOTE — Discharge Instructions (Signed)
Use antibiotic ointment as directed. Return to ED for worsening symptoms, vision changes, trauma to area, swelling around eye or fevers.

## 2017-10-08 NOTE — ED Triage Notes (Signed)
Patient states that he has has pain and itching to his left eye x 3 hours.

## 2017-10-17 ENCOUNTER — Emergency Department (HOSPITAL_BASED_OUTPATIENT_CLINIC_OR_DEPARTMENT_OTHER)
Admission: EM | Admit: 2017-10-17 | Discharge: 2017-10-18 | Disposition: A | Discharge status: Home / Self Care | Payer: Self-pay | Attending: Emergency Medicine | Admitting: Emergency Medicine

## 2017-10-17 ENCOUNTER — Encounter (HOSPITAL_BASED_OUTPATIENT_CLINIC_OR_DEPARTMENT_OTHER): Payer: Self-pay | Admitting: Emergency Medicine

## 2017-10-17 ENCOUNTER — Other Ambulatory Visit: Payer: Self-pay

## 2017-10-17 DIAGNOSIS — R55 Syncope and collapse: Secondary | ICD-10-CM | POA: Insufficient documentation

## 2017-10-17 DIAGNOSIS — Z87891 Personal history of nicotine dependence: Secondary | ICD-10-CM | POA: Insufficient documentation

## 2017-10-17 DIAGNOSIS — Z79899 Other long term (current) drug therapy: Secondary | ICD-10-CM | POA: Insufficient documentation

## 2017-10-17 DIAGNOSIS — R7989 Other specified abnormal findings of blood chemistry: Secondary | ICD-10-CM

## 2017-10-17 DIAGNOSIS — I129 Hypertensive chronic kidney disease with stage 1 through stage 4 chronic kidney disease, or unspecified chronic kidney disease: Secondary | ICD-10-CM | POA: Insufficient documentation

## 2017-10-17 DIAGNOSIS — Z86718 Personal history of other venous thrombosis and embolism: Secondary | ICD-10-CM | POA: Insufficient documentation

## 2017-10-17 DIAGNOSIS — Z7982 Long term (current) use of aspirin: Secondary | ICD-10-CM | POA: Insufficient documentation

## 2017-10-17 DIAGNOSIS — Z8673 Personal history of transient ischemic attack (TIA), and cerebral infarction without residual deficits: Secondary | ICD-10-CM | POA: Insufficient documentation

## 2017-10-17 DIAGNOSIS — R944 Abnormal results of kidney function studies: Secondary | ICD-10-CM | POA: Insufficient documentation

## 2017-10-17 DIAGNOSIS — N183 Chronic kidney disease, stage 3 (moderate): Secondary | ICD-10-CM | POA: Insufficient documentation

## 2017-10-17 DIAGNOSIS — E785 Hyperlipidemia, unspecified: Secondary | ICD-10-CM | POA: Insufficient documentation

## 2017-10-17 LAB — CBC
HEMATOCRIT: 33.8 % — AB (ref 39.0–52.0)
HEMOGLOBIN: 11.3 g/dL — AB (ref 13.0–17.0)
MCH: 31 pg (ref 26.0–34.0)
MCHC: 33.4 g/dL (ref 30.0–36.0)
MCV: 92.6 fL (ref 78.0–100.0)
Platelets: 296 10*3/uL (ref 150–400)
RBC: 3.65 MIL/uL — ABNORMAL LOW (ref 4.22–5.81)
RDW: 12.7 % (ref 11.5–15.5)
WBC: 11 10*3/uL — ABNORMAL HIGH (ref 4.0–10.5)

## 2017-10-17 LAB — BASIC METABOLIC PANEL
Anion gap: 9 (ref 5–15)
BUN: 32 mg/dL — AB (ref 6–20)
CHLORIDE: 101 mmol/L (ref 101–111)
CO2: 28 mmol/L (ref 22–32)
CREATININE: 2.08 mg/dL — AB (ref 0.61–1.24)
Calcium: 9.1 mg/dL (ref 8.9–10.3)
GFR calc Af Amer: 39 mL/min — ABNORMAL LOW (ref >60)
GFR calc non Af Amer: 33 mL/min — ABNORMAL LOW (ref >60)
Glucose, Bld: 109 mg/dL — ABNORMAL HIGH (ref 65–99)
Potassium: 4.2 mmol/L (ref 3.5–5.1)
SODIUM: 138 mmol/L (ref 135–145)

## 2017-10-17 NOTE — ED Triage Notes (Signed)
Patient states that he was slightly lightheaded at [facility] x 5 minutes. Denies any complaints at this time  - was sent by [facility] to be evaluated

## 2017-10-17 NOTE — ED Notes (Signed)
Pt states that he was sitting for a long time during a meeting and after he stood up he felt lightheaded and had a near syncopal episode that caused him to fall onto the floor. Pt states that he did not completely loose consciousness. Pt reports he is asymptomatic at this time.

## 2017-10-17 NOTE — ED Provider Notes (Signed)
[facility] EMERGENCY DEPARTMENT Provider Note   CSN: [id number] Arrival date & time: 10/17/17  1845     History   Chief Complaint Chief Complaint  Patient presents with  . Dizziness    HPI Hanz Winterhalter Broecker is a 57 y.o. male.  Patient with history of alcohol abuse currently in [facility] for treatment, history of CHF with 40% ejection fraction in 07/2017 started on Lasix and at that time compliant, previous history of syncope, previous history of peptic ulcer disease, history of stroke and carotid endarterectomy, CKD stage III (baseline crt 1.5) --presents with near syncopal episode.  Patient was sitting at a desk this evening when he began to feel lightheaded.  He was able to stand up and go into another room where he sat down in a chair and slid to the floor.  He was assisted by someone else at the facility.  He did not have full syncope and symptoms only lasted for a couple of minutes.  He did not have any chest pain or shortness of breath with this.  He has had a recent upper respiratory tract infection.  States that he has been eating and drinking well.  He denies lower extremity swelling or having to sit up to sleep. Denies bleeding in the stools, abdominal pain, diarrhea.  The onset of this condition was acute. The course is resolved. Aggravating factors: none. Alleviating factors: none.        Past Medical History:  Diagnosis Date  . Anemia, unspecified   . Arthritis   . Diverticulosis of colon (without mention of hemorrhage)   . Gastric ulcer with hemorrhage and obstruction 2011   Dr [facility]  . Hyperlipemia   . Hypertension   . Personal history of colonic polyps 01/30/2010   hyperplastic rectal  . Sleep apnea    on CPAP; [facility]    Patient Active Problem List   Diagnosis Date Noted  . CKD (chronic kidney disease) stage 3, GFR 30-59 ml/min (HCC) 09/26/2017  . Syncope 09/26/2017  . Melena   . Gastric ulcer with hemorrhage   .  Right hemiparesis ([place]) 09/18/2015  . Chronic blood loss anemia 09/18/2015  . Hematemesis 09/18/2015  . Wellness examination 06/29/2015  . History of CEA (carotid endarterectomy) 03/25/2015  . HLD (hyperlipidemia) 03/25/2015  . Tobacco use disorder 03/25/2015  . Carotid artery occlusion with infarction ([place]) 03/25/2015  . 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 Stroke ([place])   . Aortic arch atherosclerosis ([place])   . Carotid stenosis 01/16/2015  . Thrombus: BILATERAL EXTERNAL JUGULAR VEINS per CT angio head and neck 01/16/15 01/16/2015  . DVT (deep venous thrombosis) ([place])   . Numbness and tingling of left arm and leg 01/15/2015  . CVA (cerebral infarction)   . Paresthesia 12/08/2014  . 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 ADD (attention deficit disorder) 07/14/2013  . Nicotine dependence 06/18/2013  . Back pain, chronic 06/17/2013  . ACUTE GASTRIC ULCER W/HEMORRHAGE AND OBSTRUCTION 02/28/2010  . PERSONAL HX COLONIC POLYPS 02/28/2010  . DIVERTICULOSIS, COLON 02/02/2010  . ANEMIA, SEVERE 01/13/2010  . Essential hypertension 01/13/2010  . Sleep apnea 01/13/2010  . Left carotid bruit 01/13/2010  . 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 CAROTID ENDARTERECTOMY  11/2006   left Dr Mamie Nick  . ENDARTERECTOMY Right 01/19/2015   Procedure: RIGHT CAROTID ENDARTERECTOMY WITH PATCH ANGIOPLASTY;  Surgeon: Mal Misty, MD;  Location: [place] [place];  Service: Vascular;  Laterality: Right;  . ESOPHAGOGASTRODUODENOSCOPY Left 06/17/2013   Procedure: ESOPHAGOGASTRODUODENOSCOPY (EGD);  Surgeon: Arta Silence, MD;  Location: [facility] ENDOSCOPY;  Service: Endoscopy;  Laterality: Left;  . ESOPHAGOGASTRODUODENOSCOPY N/A 09/20/2015   Procedure: ESOPHAGOGASTRODUODENOSCOPY (EGD);  Surgeon: Ladene Artist, MD;  Location: [facility] ENDOSCOPY;  Service: Endoscopy;  Laterality: N/A;  . [place]  . NASAL FRACTURE SURGERY    . TEE WITHOUT  CARDIOVERSION N/A 01/17/2015   Procedure: TRANSESOPHAGEAL ECHOCARDIOGRAM (TEE);  Surgeon: Sanda [place], MD;  Location: [facility] ENDOSCOPY;  Service: Cardiovascular;  Laterality: N/A;  . WISDOM TOOTH EXTRACTION         Home Medications    Prior to Admission medications   Medication Sig Start Date End Date Taking? Authorizing Provider  aspirin EC 81 MG tablet Take 1 tablet (81 mg total) by mouth daily. 09/21/15   Ghimire, Henreitta Leber, MD  atorvastatin (LIPITOR) 80 MG tablet TAKE 1 TABLET(80 MG) BY MOUTH DAILY 09/26/17   Biagio Borg, MD  diphenhydrAMINE (BENADRYL) 25 MG tablet Take 1-2 tablets (25-50 mg total) by mouth at bedtime as needed for sleep. 08/08/17   Sherwood Gambler, MD  erythromycin ophthalmic ointment Place a 1/2 inch ribbon of ointment into the lower eyelid. 10/08/17   Khatri, Hina, PA-C  fenofibrate 54 MG tablet Take 1 tablet (54 mg total) by mouth daily. 09/26/17   Biagio Borg, MD  ferrous sulfate 325 (65 FE) MG tablet TAKE 1 TABLET BY MOUTH TWICE DAILY WITH A MEAL 09/30/17   Biagio Borg, MD  furosemide (LASIX) 20 MG tablet Take 1 tablet (20 mg total) by mouth daily. 09/26/17   Biagio Borg, MD  guaiFENesin (MUCINEX) 600 MG 12 hr tablet Take 2 tablets (1,200 mg total) by mouth 2 (two) times daily as needed. 09/26/17   Biagio Borg, MD  HYDROcodone-acetaminophen (NORCO/VICODIN) 5-325 MG per tablet Take 1 tablet by mouth every 6 (six) hours as needed for moderate pain. 01/20/15   Ulyses Amor, PA-C  lisinopril (PRINIVIL,ZESTRIL) 10 MG tablet Take 1 tablet (10 mg total) by mouth daily. 09/26/17   Biagio Borg, MD  metoprolol tartrate (LOPRESSOR) 25 MG tablet Take 0.5 tablets (12.5 mg total) by mouth 2 (two) times daily. 09/26/17   Biagio Borg, MD  Multiple Vitamins-Minerals (MULTIVITAL PO) Take 1 tablet by mouth daily.     [provider]  naltrexone (DEPADE) 50 MG tablet Take 1 tablet (50 mg total) by mouth daily. 09/26/17   Biagio Borg, MD  pantoprazole (PROTONIX) 40 MG  tablet TAKE 1 TABLET(40 MG) BY MOUTH TWICE DAILY 09/26/17   Biagio Borg, MD  PARoxetine (PAXIL) 20 MG tablet Take 1 tablet (20 mg total) by mouth daily. 09/26/17   Biagio Borg, MD    Family History Family History  Problem Relation Age of Onset  . Diabetes Father   . Hypertension Father   . Stroke Father        in late 35s  . Heart attack Mother        in 81s  . Heart attack Brother 73  . Diabetes Paternal Grandfather   . Cancer Maternal Grandfather        unknown type  . Heart attack Maternal Grandfather 76  . Stroke Paternal Uncle        in late 58s  . Colon cancer Neg Hx   . Esophageal cancer Neg Hx   . Stomach cancer Neg Hx  Social History Social History   Tobacco Use  . Smoking status: Former Smoker    Packs/day: 1.00    Types: Cigarettes    Last attempt to quit: 12/26/2014    Years since quitting: 2.8  . Smokeless tobacco: Never Used  . Tobacco comment: smoked 1976-2007 & 2009- present , up to 2 ppd, average > 1ppd  Substance Use Topics  . Alcohol use: Yes    Alcohol/week: 0.0 oz    Comment: currently at [facility] for treatment.   . Drug use: No     Allergies   Adderall [amphetamine-dextroamphet er]; Prozac [fluoxetine hcl]; Sertraline hcl; and Other   Review of Systems Review of Systems  Constitutional: Negative for diaphoresis and fever.  HENT: Positive for congestion and voice change (URI sx).   Eyes: Negative for redness.  Respiratory: Negative for cough and shortness of breath.   Cardiovascular: Negative for chest pain, palpitations and leg swelling.  Gastrointestinal: Negative for abdominal pain, blood in stool, nausea and vomiting.  Genitourinary: Negative for dysuria.  Musculoskeletal: Negative for back pain and neck pain.  Skin: Negative for rash.  Neurological: Positive for syncope (near syncope). Negative for light-headedness.  Psychiatric/Behavioral: The patient is not nervous/anxious.      Physical Exam Updated Vital  Signs BP (!) 188/103 (BP Location: Right Arm)   Pulse 78   Temp 98.8 F (37.1 C) (Oral)   Resp 18   Ht 5\' 8"  (1.727 m)   Wt 81.6 kg (180 lb)   SpO2 97%   BMI 27.37 kg/m   Physical Exam  Constitutional: He appears well-developed and well-nourished.  HENT:  Head: Normocephalic and atraumatic.  Mouth/Throat: Oropharynx is clear and moist and mucous membranes are normal. Mucous membranes are not dry.  Eyes: Conjunctivae are normal.  Neck: Trachea normal and normal range of motion. Neck supple. Normal carotid pulses and no JVD present. No muscular tenderness present. Carotid bruit is not present. No tracheal deviation present.  Cardiovascular: Normal rate, regular rhythm, S1 normal, S2 normal, normal heart sounds and intact distal pulses. Exam reveals no distant heart sounds and no decreased pulses.  No murmur heard. Pulmonary/Chest: Effort normal and breath sounds normal. No respiratory distress. He has no wheezes. He exhibits no tenderness.  Abdominal: Soft. Normal aorta and bowel sounds are normal. There is no tenderness. There is no rebound and no guarding.  Musculoskeletal: He exhibits no edema or tenderness.  No lower extremity swelling.  Neurological: He is alert.  Skin: Skin is warm and dry. He is not diaphoretic. No cyanosis. No pallor.  Psychiatric: He has a normal mood and affect.  Nursing note and vitals reviewed.    ED Treatments / Results  Labs (all labs ordered are listed, but only abnormal results are displayed) Labs Reviewed  CBC - Abnormal; Notable for the following components:      Result Value   WBC 11.0 (*)    RBC 3.65 (*)    Hemoglobin 11.3 (*)    HCT 33.8 (*)    All other components within normal limits  BASIC METABOLIC PANEL - Abnormal; Notable for the following components:   Glucose, Bld 109 (*)    BUN 32 (*)    Creatinine, Ser 2.08 (*)    GFR calc non Af Amer 33 (*)    GFR calc Af Amer 39 (*)    All other components within normal limits     EKG  EKG Interpretation  Date/Time:  Thursday October 17 2017 19:16:06 EST  Ventricular Rate:  82 PR Interval:  138 QRS Duration: 94 QT Interval:  420 QTC Calculation: 490 R Axis:   16 Text Interpretation:  Normal sinus rhythm Possible Left atrial enlargement Prolonged QT Abnormal ECG since last tracing no significant change Confirmed by Malvin Johns [phone]) on 10/17/2017 8:39:05 PM       Radiology No results found.  Procedures Procedures (including critical care time)  Medications Ordered in ED Medications - No data to display   Initial Impression / Assessment and Plan / ED Course  I have reviewed the triage vital signs and the nursing notes.  Pertinent labs & imaging results that were available during my care of the patient were reviewed by me and considered in my medical decision making (see chart for details).     Patient seen and examined. Work-up initiated. Medications ordered. Reviewed hospitalization from [facility] 07/2017. Had echo done then showing CHF with EF=40%.   Vital signs reviewed and are as follows: BP (!) 188/103 (BP Location: Right Arm)   Pulse 78   Temp 98.8 F (37.1 C) (Oral)   Resp 18   Ht 5\' 8"  (1.727 m)   Wt 81.6 kg (180 lb)   SpO2 97%   BMI 27.37 kg/m   Patient without significant anemia or dehydration.  Negative orthostatics.  EKG unchanged.  Creatinine is slightly elevated at 2.0, however this is unchanged from 3 weeks ago.  Patient informed of all results.  He is still at baseline and feels well.  Discussed unclear etiology of his dizzy spell but likely multifactorial.  We discussed need to follow-up with his cardiologist and primary care doctor regarding chronic kidney disease and congestive heart failure.  Discussed that he should return to the emergency department for evaluation if he develops any chest pains, shortness of breath, or has additional episodes of lightheadedness or passing out completely.  Patient  verbalizes understanding and willingness to return if symptoms change.  Final Clinical Impressions(s) / ED Diagnoses   Final diagnoses:  Near syncope  Elevated serum creatinine   Patient with near syncope, no full syncope.  He had positive prodrome.  No chest pain or shortness of breath.  No clinical signs of fluid overload.  Patient was admitted in the beginning of December 2018 for syncope and had a workup during which he was diagnosed with congestive heart failure.  He has been compliant with his medications.  Slightly worse creatinine today but stable from 3 weeks ago.  Do not feel that patient needs to be readmitted due to transient dizziness with prodrome and no full syncope.  Doubt arrhythmia given this story. Patient did not have any other concerning features.  No bleeding signs or anemia.  He is at his baseline currently.  Mildly prolonged QTc (490) on EKG but patient is on no medications which would exacerbate this.  He has several chronic medical problems which will require close follow-up.  Return instructions as above.  Patient appears well.  ED Discharge Orders    None       Carlisle Cater, PA-C 10/18/17 0034    Malvin Johns, MD 10/18/17 [phone]

## 2017-10-18 ENCOUNTER — Encounter: Payer: Self-pay | Admitting: Internal Medicine

## 2017-10-18 NOTE — Discharge Instructions (Signed)
Please read and follow all provided instructions.  Your diagnoses today include:  1. Near syncope    Tests performed today include:  EKG -no changes  Blood counts and electrolytes -your kidney function is slightly high, this appears chronic, this needs to be rechecked by your primary doctor  Vital signs. See below for your results today.   Medications prescribed:   None  Take any prescribed medications only as directed.  Home care instructions:  Follow any educational materials contained in this packet.  BE VERY CAREFUL not to take multiple medicines containing Tylenol (also called acetaminophen). Doing so can lead to an overdose which can damage your liver and cause liver failure and possibly death.   Follow-up instructions: Please follow-up with your primary care provider in the next 7 days for further evaluation of your symptoms.   Return instructions:   Please return to the Emergency Department if you experience worsening symptoms.   Return if you develop chest pain, shortness of breath, if you pass out   Please return if you have any other emergent concerns.  Additional Information:  Your vital signs today were: BP (!) 188/103 (BP Location: Right Arm)    Pulse 78    Temp 98.8 F (37.1 C) (Oral)    Resp 18    Ht 5\' 8"  (1.727 m)    Wt 81.6 kg (180 lb)    SpO2 97%    BMI 27.37 kg/m  If your blood pressure (BP) was elevated above 135/85 this visit, please have this repeated by your doctor within one month. --------------

## 2017-10-19 ENCOUNTER — Other Ambulatory Visit: Payer: Self-pay

## 2017-10-19 ENCOUNTER — Encounter (HOSPITAL_BASED_OUTPATIENT_CLINIC_OR_DEPARTMENT_OTHER): Payer: Self-pay | Admitting: Emergency Medicine

## 2017-10-19 ENCOUNTER — Emergency Department (HOSPITAL_BASED_OUTPATIENT_CLINIC_OR_DEPARTMENT_OTHER)
Admission: EM | Admit: 2017-10-19 | Discharge: 2017-10-19 | Disposition: A | Discharge status: Home / Self Care | Payer: Self-pay | Attending: Emergency Medicine | Admitting: Emergency Medicine

## 2017-10-19 DIAGNOSIS — S39012A Strain of muscle, fascia and tendon of lower back, initial encounter: Secondary | ICD-10-CM | POA: Insufficient documentation

## 2017-10-19 DIAGNOSIS — Z79899 Other long term (current) drug therapy: Secondary | ICD-10-CM | POA: Insufficient documentation

## 2017-10-19 DIAGNOSIS — W19XXXA Unspecified fall, initial encounter: Secondary | ICD-10-CM

## 2017-10-19 DIAGNOSIS — S20229A Contusion of unspecified back wall of thorax, initial encounter: Secondary | ICD-10-CM | POA: Insufficient documentation

## 2017-10-19 DIAGNOSIS — I2510 Atherosclerotic heart disease of native coronary artery without angina pectoris: Secondary | ICD-10-CM | POA: Insufficient documentation

## 2017-10-19 DIAGNOSIS — N183 Chronic kidney disease, stage 3 (moderate): Secondary | ICD-10-CM | POA: Insufficient documentation

## 2017-10-19 DIAGNOSIS — W010XXA Fall on same level from slipping, tripping and stumbling without subsequent striking against object, initial encounter: Secondary | ICD-10-CM | POA: Insufficient documentation

## 2017-10-19 DIAGNOSIS — Y9389 Activity, other specified: Secondary | ICD-10-CM | POA: Insufficient documentation

## 2017-10-19 DIAGNOSIS — Z87891 Personal history of nicotine dependence: Secondary | ICD-10-CM | POA: Insufficient documentation

## 2017-10-19 DIAGNOSIS — I129 Hypertensive chronic kidney disease with stage 1 through stage 4 chronic kidney disease, or unspecified chronic kidney disease: Secondary | ICD-10-CM | POA: Insufficient documentation

## 2017-10-19 DIAGNOSIS — Y92121 Bathroom in nursing home as the place of occurrence of the external cause: Secondary | ICD-10-CM | POA: Insufficient documentation

## 2017-10-19 DIAGNOSIS — F909 Attention-deficit hyperactivity disorder, unspecified type: Secondary | ICD-10-CM | POA: Insufficient documentation

## 2017-10-19 DIAGNOSIS — Y999 Unspecified external cause status: Secondary | ICD-10-CM | POA: Insufficient documentation

## 2017-10-19 DIAGNOSIS — Z7982 Long term (current) use of aspirin: Secondary | ICD-10-CM | POA: Insufficient documentation

## 2017-10-19 MED ORDER — CYCLOBENZAPRINE HCL 10 MG PO TABS: 10.0000 mg | ORAL_TABLET | Freq: Two times a day (BID) | ORAL | 0 refills | Status: DC | PRN

## 2017-10-19 MED ORDER — ACETAMINOPHEN 500 MG PO TABS: 1000.0000 mg | ORAL_TABLET | Freq: Four times a day (QID) | ORAL | 0 refills | Status: DC | PRN

## 2017-10-19 NOTE — ED Triage Notes (Signed)
Pt is from [facility]. Reports slipping and falling on the bathroom floor. C/o low back pain.

## 2017-10-19 NOTE — ED Provider Notes (Signed)
[place] [facility] EMERGENCY DEPARTMENT Provider Note   CSN: [id number] Arrival date & time: 10/19/17  1013     History   Chief Complaint Chief Complaint  Patient presents with  . Fall    HPI Martin Archer is a 57 y.o. male.  HPI Patient reports he slipped and fell in the bathroom at about 430 this morning at [facility].  His feet went out from under him causing him to land on his lower back.  He reports 1 of the techs at the facility helped him get back up.  Since then he has been having increased pain in the low central back.  He reports he can walk.  Patient reports he also has problems with chronic lower back pain as well.  Pain is worse with certain position changes of movements.  Patient reports he chronically gets some numbness into his legs.  He does not note this is any worse or different from baseline.  No associated upper back pain or head injury.  Patient has been using ibuprofen for pain with some relief.  Ports also warm moist heat has been helping. Past Medical History:  Diagnosis Date  . Anemia, unspecified   . Arthritis   . Diverticulosis of colon (without mention of hemorrhage)   . Gastric ulcer with hemorrhage and obstruction 2011   Dr Fidel[facility]  . Hyperlipemia   . Hypertension   . Personal history of colonic polyps 01/30/2010   hyperplastic rectal  . Sleep apnea    on CPAP; [facility]    Patient Active Problem List   Diagnosis Date Noted  . CKD (chronic kidney disease) stage 3, GFR 30-59 ml/min (HCC) 09/26/2017  . Syncope 09/26/2017  . Melena   . Gastric ulcer with hemorrhage   . Right hemiparesis ([place]) 09/18/2015  . Chronic blood loss anemia 09/18/2015  . Hematemesis 09/18/2015  . Wellness examination 06/29/2015  . History of CEA (carotid endarterectomy) 03/25/2015  . HLD (hyperlipidemia) 03/25/2015  . Tobacco use disorder 03/25/2015  . Carotid artery occlusion with infarction ([place]) 03/25/2015  . 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 Stroke ([place])   . Aortic arch atherosclerosis ([place])   . Carotid stenosis 01/16/2015  . Thrombus: BILATERAL EXTERNAL JUGULAR VEINS per CT angio head and neck 01/16/15 01/16/2015  . DVT (deep venous thrombosis) ([place])   . Numbness and tingling of left arm and leg 01/15/2015  . CVA (cerebral infarction)   . Paresthesia 12/08/2014  . 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 ADD (attention deficit disorder) 07/14/2013  . Nicotine dependence 06/18/2013  . Back pain, chronic 06/17/2013  . ACUTE GASTRIC ULCER W/HEMORRHAGE AND OBSTRUCTION 02/28/2010  . PERSONAL HX COLONIC POLYPS 02/28/2010  . DIVERTICULOSIS, COLON 02/02/2010  . ANEMIA, SEVERE 01/13/2010  . Essential hypertension 01/13/2010  . Sleep apnea 01/13/2010  . Left carotid bruit 01/13/2010  . 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 CAROTID ENDARTERECTOMY  11/2006   left Dr Mamie Nick  . ENDARTERECTOMY Right 01/19/2015   Procedure: RIGHT CAROTID ENDARTERECTOMY WITH PATCH ANGIOPLASTY;  Surgeon: Mal Misty, MD;  Location: [facility];  Service: Vascular;  Laterality: Right;  . ESOPHAGOGASTRODUODENOSCOPY Left 06/17/2013   Procedure: ESOPHAGOGASTRODUODENOSCOPY (EGD);  Surgeon: Arta Silence, MD;  Location: [facility] ENDOSCOPY;  Service: Endoscopy;  Laterality: Left;  . ESOPHAGOGASTRODUODENOSCOPY N/A 09/20/2015   Procedure: ESOPHAGOGASTRODUODENOSCOPY (EGD);  Surgeon: Ladene Artist, MD;  Location: [facility] ENDOSCOPY;  Service: Endoscopy;  Laterality: N/A;  . [place]  . NASAL FRACTURE SURGERY    . TEE WITHOUT CARDIOVERSION N/A 01/17/2015   Procedure: TRANSESOPHAGEAL ECHOCARDIOGRAM (TEE);  Surgeon: Sanda Klein, MD;  Location: [facility] ENDOSCOPY;  Service: Cardiovascular;  Laterality: N/A;  . WISDOM TOOTH EXTRACTION         Home Medications    Prior to Admission medications   Medication Sig Start Date End Date Taking? Authorizing Provider    acetaminophen (TYLENOL) 500 MG tablet Take 2 tablets (1,000 mg total) by mouth every 6 (six) hours as needed. 10/19/17   Charlesetta Shanks, MD  aspirin EC 81 MG tablet Take 1 tablet (81 mg total) by mouth daily. 09/21/15   Ghimire, Henreitta Leber, MD  atorvastatin (LIPITOR) 80 MG tablet TAKE 1 TABLET(80 MG) BY MOUTH DAILY 09/26/17   Biagio Borg, MD  cyclobenzaprine (FLEXERIL) 10 MG tablet Take 1 tablet (10 mg total) by mouth 2 (two) times daily as needed for muscle spasms. 10/19/17   Charlesetta Shanks, MD  diphenhydrAMINE (BENADRYL) 25 MG tablet Take 1-2 tablets (25-50 mg total) by mouth at bedtime as needed for sleep. 08/08/17   Sherwood Gambler, MD  erythromycin ophthalmic ointment Place a 1/2 inch ribbon of ointment into the lower eyelid. 10/08/17   Khatri, Hina, PA-C  fenofibrate 54 MG tablet Take 1 tablet (54 mg total) by mouth daily. 09/26/17   Biagio Borg, MD  ferrous sulfate 325 (65 FE) MG tablet TAKE 1 TABLET BY MOUTH TWICE DAILY WITH A MEAL 09/30/17   Biagio Borg, MD  furosemide (LASIX) 20 MG tablet Take 1 tablet (20 mg total) by mouth daily. 09/26/17   Biagio Borg, MD  guaiFENesin (MUCINEX) 600 MG 12 hr tablet Take 2 tablets (1,200 mg total) by mouth 2 (two) times daily as needed. 09/26/17   Biagio Borg, MD  HYDROcodone-acetaminophen (NORCO/VICODIN) 5-325 MG per tablet Take 1 tablet by mouth every 6 (six) hours as needed for moderate pain. 01/20/15   Ulyses Amor, PA-C  lisinopril (PRINIVIL,ZESTRIL) 10 MG tablet Take 1 tablet (10 mg total) by mouth daily. 09/26/17   Biagio Borg, MD  metoprolol tartrate (LOPRESSOR) 25 MG tablet Take 0.5 tablets (12.5 mg total) by mouth 2 (two) times daily. 09/26/17   Biagio Borg, MD  Multiple Vitamins-Minerals (MULTIVITAL PO) Take 1 tablet by mouth daily.     [provider]  naltrexone (DEPADE) 50 MG tablet Take 1 tablet (50 mg total) by mouth daily. 09/26/17   Biagio Borg, MD  pantoprazole (PROTONIX) 40 MG tablet TAKE 1 TABLET(40 MG) BY MOUTH TWICE  DAILY 09/26/17   Biagio Borg, MD  PARoxetine (PAXIL) 20 MG tablet Take 1 tablet (20 mg total) by mouth daily. 09/26/17   Biagio Borg, MD    Family History Family History  Problem Relation Age of Onset  . Diabetes Father   . Hypertension Father   . Stroke Father        in late 21s  . Heart attack Mother        in 73s  . Heart attack Brother 45  . Diabetes Paternal Grandfather   . Cancer Maternal Grandfather        unknown type  . Heart attack Maternal Grandfather 76  . Stroke Paternal Uncle        in late 38s  . Colon cancer Neg Hx   . Esophageal cancer Neg Hx   . Stomach cancer Neg Hx  Social History Social History   Tobacco Use  . Smoking status: Former Smoker    Packs/day: 1.00    Types: Cigarettes    Last attempt to quit: 12/26/2014    Years since quitting: 2.8  . Smokeless tobacco: Never Used  . Tobacco comment: smoked 1976-2007 & 2009- present , up to 2 ppd, average > 1ppd  Substance Use Topics  . Alcohol use: Yes    Alcohol/week: 0.0 oz    Comment: currently at [facility] for treatment.   . Drug use: No     Allergies   Adderall [amphetamine-dextroamphet er]; Prozac [fluoxetine hcl]; Sertraline hcl; and Other   Review of Systems Review of Systems 10 Systems reviewed and are negative for acute change except as noted in the HPI.  Physical Exam Updated Vital Signs BP (!) 128/99 (BP Location: Left Arm)   Pulse 78   Temp 98.6 F (37 C) (Oral)   Resp 20   Ht 5\' 8"  (1.727 m)   Wt 81.6 kg (180 lb)   SpO2 98%   BMI 27.37 kg/m   Physical Exam  Constitutional: He is oriented to person, place, and time.  Patient is alert and nontoxic.  He is seated in the stretcher with no acute distress.  HENT:  Head: Normocephalic and atraumatic.  Eyes: Conjunctivae and EOM are normal.  Neck: Neck supple.  Cardiovascular: Normal rate, regular rhythm and normal heart sounds.  Pulmonary/Chest: Effort normal and breath sounds normal.  Abdominal: Soft. He  exhibits no distension. There is no tenderness. There is no guarding.  Musculoskeletal:  No visible bruising on the back.  Patient has area of localized tenderness to the area of low back in the sacral region.  No bony point tenderness of the mid lumbar or thoracic spine.  No deformities of the lower extremities.  Patient can stand and ambulate with steady gait.  Neurological: He is alert and oriented to person, place, and time. No cranial nerve deficit. He exhibits normal muscle tone. Coordination normal.  Skin: Skin is warm and dry.  Psychiatric: He has a normal mood and affect.     ED Treatments / Results  Labs (all labs ordered are listed, but only abnormal results are displayed) Labs Reviewed - No data to display  EKG  EKG Interpretation None       Radiology No results found.  Procedures Procedures (including critical care time)  Medications Ordered in ED Medications - No data to display   Initial Impression / Assessment and Plan / ED Course  I have reviewed the triage vital signs and the nursing notes.  Pertinent labs & imaging results that were available during my care of the patient were reviewed by me and considered in my medical decision making (see chart for details).      Final Clinical Impressions(s) / ED Diagnoses   Final diagnoses:  Fall, initial encounter  Strain of lumbar region, initial encounter  Contusion of back, unspecified laterality, initial encounter  Patient is clinically well at this time.  No signs of other additional injury.  He is neurologically intact.  Pain is central to the sacrum and very low lumbar area.  Patient is ambulatory with steady gait.  Patient does have known history of renal insufficiency.  At this time I recommend changing from ibuprofen for pain control to acetaminophen.  Will add Flexeril.  Patient is counseled on first applying cool compresses for 48 hours and then warm moist heat.  ED Discharge Orders  Ordered     acetaminophen (TYLENOL) 500 MG tablet  Every 6 hours PRN     10/19/17 1114    cyclobenzaprine (FLEXERIL) 10 MG tablet  2 times daily PRN     10/19/17 1114       Charlesetta Shanks, MD 10/19/17 1123

## 2017-12-23 ENCOUNTER — Ambulatory Visit: Payer: Self-pay | Admitting: Internal Medicine

## 2017-12-23 DIAGNOSIS — Z0289 Encounter for other administrative examinations: Secondary | ICD-10-CM

## 2018-08-27 HISTORY — PX: FOOT SURGERY: SHX648

## 2018-11-06 ENCOUNTER — Encounter: Payer: Self-pay | Admitting: Internal Medicine

## 2018-11-06 ENCOUNTER — Other Ambulatory Visit: Payer: Self-pay

## 2018-11-06 ENCOUNTER — Ambulatory Visit (INDEPENDENT_AMBULATORY_CARE_PROVIDER_SITE_OTHER): Payer: Medicaid Other | Admitting: Internal Medicine

## 2018-11-06 VITALS — BP 122/84 | HR 84 | Temp 97.7°F | Ht 68.0 in | Wt 200.0 lb

## 2018-11-06 DIAGNOSIS — I10 Essential (primary) hypertension: Secondary | ICD-10-CM | POA: Diagnosis not present

## 2018-11-06 DIAGNOSIS — E785 Hyperlipidemia, unspecified: Secondary | ICD-10-CM | POA: Diagnosis not present

## 2018-11-06 DIAGNOSIS — M549 Dorsalgia, unspecified: Secondary | ICD-10-CM | POA: Diagnosis not present

## 2018-11-06 DIAGNOSIS — G8929 Other chronic pain: Secondary | ICD-10-CM

## 2018-11-07 MED ORDER — ATORVASTATIN CALCIUM 80 MG PO TABS: ORAL_TABLET | ORAL | 3 refills | Status: DC

## 2018-11-07 MED ORDER — FENOFIBRATE 54 MG PO TABS: 54.0000 mg | ORAL_TABLET | Freq: Every day | ORAL | 3 refills | Status: DC

## 2018-11-07 MED ORDER — METOPROLOL TARTRATE 25 MG PO TABS: 12.5000 mg | ORAL_TABLET | Freq: Two times a day (BID) | ORAL | 3 refills | Status: DC

## 2018-11-07 MED ORDER — LISINOPRIL 10 MG PO TABS: 10.0000 mg | ORAL_TABLET | Freq: Every day | ORAL | 3 refills | Status: DC

## 2018-11-07 MED ORDER — PAROXETINE HCL 20 MG PO TABS: 20.0000 mg | ORAL_TABLET | Freq: Every day | ORAL | 3 refills | Status: DC

## 2018-11-07 MED ORDER — PANTOPRAZOLE SODIUM 40 MG PO TBEC: DELAYED_RELEASE_TABLET | ORAL | 3 refills | Status: DC

## 2018-11-07 MED ORDER — FUROSEMIDE 20 MG PO TABS: 20.0000 mg | ORAL_TABLET | Freq: Every day | ORAL | 3 refills | Status: DC

## 2018-11-07 NOTE — Assessment & Plan Note (Signed)
stable overall by history and exam, recent data reviewed with pt, and pt to continue medical treatment as before,  to f/u any worsening symptoms or concerns  

## 2018-11-07 NOTE — Patient Instructions (Signed)
Please continue all other medications as before, and refills have been done if requested.  Please have the pharmacy call with any other refills you may need.  Please continue your efforts at being more active, low cholesterol diet, and weight control.  You are otherwise up to date with prevention measures today.  Please keep your appointments with your specialists as you may have planned  Please go to the LAB in the Basement (turn left off the elevator) for the tests to be done for Monday  You will be contacted by phone if any changes need to be made immediately.  Otherwise, you will receive a letter about your results with an explanation, but please check with MyChart first.  Please remember to sign up for MyChart if you have not done so, as this will be important to you in the future with finding out test results, communicating by private email, and scheduling acute appointments online when needed.  Please return in 1 year for your yearly visit, or sooner if needed

## 2018-11-07 NOTE — Progress Notes (Signed)
Subjective:    Patient ID: Martin Archer, male    DOB: [birth year], 58 y.o.   MRN: [id number]  HPI   Here to f/u; overall doing ok,  Pt denies chest pain, increasing sob or doe, wheezing, orthopnea, PND, increased LE swelling, palpitations, dizziness or syncope.  Pt denies new neurological symptoms such as new headache, or facial or extremity weakness or numbness.  Pt denies polydipsia, polyuria, or low sugar episode.  Pt states overall good compliance with meds, mostly trying to follow appropriate diet, with wt overall stable,  but little exercise however. Pt continues to have recurring LBP without change in severity, bowel or bladder change, fever, wt loss,  worsening LE pain/numbness/weakness, gait change or falls. Stopped ETOH about 1.5 yrs ago, and really trying to work on diet and med compliance.  Needs refills, and lab follow up.   Past Medical History:  Diagnosis Date  . Anemia, unspecified   . Arthritis   . Diverticulosis of colon (without mention of hemorrhage)   . Gastric ulcer with hemorrhage and obstruction 2011   Dr [facility]  . Hyperlipemia   . Hypertension   . Personal history of colonic polyps 01/30/2010   hyperplastic rectal  . Sleep apnea    on CPAP; [facility]   Past Surgical History:  Procedure Laterality Date  . CAROTID ENDARTERECTOMY  11/2006   left Dr Mamie Nick  . ENDARTERECTOMY Right 01/19/2015   Procedure: RIGHT CAROTID ENDARTERECTOMY WITH PATCH ANGIOPLASTY;  Surgeon: Mal Misty, MD;  Location: [facility];  Service: Vascular;  Laterality: Right;  . ESOPHAGOGASTRODUODENOSCOPY Left 06/17/2013   Procedure: ESOPHAGOGASTRODUODENOSCOPY (EGD);  Surgeon: Arta Silence, MD;  Location[facility] ENDOSCOPY;  Service: Endoscopy;  Laterality: Left;  . ESOPHAGOGASTRODUODENOSCOPY N/A 09/20/2015   Procedure: ESOPHAGOGASTRODUODENOSCOPY (EGD);  Surgeon: Ladene Artist, MD;  Location: [facility] ENDOSCOPY;  Service: Endoscopy;  Laterality: N/A;  . [place]  . NASAL FRACTURE SURGERY    . TEE WITHOUT CARDIOVERSION N/A 01/17/2015   Procedure: TRANSESOPHAGEAL ECHOCARDIOGRAM (TEE);  Surgeon: Sanda Klein, MD;  Location: [facility] ENDOSCOPY;  Service: Cardiovascular;  Laterality: N/A;  . WISDOM TOOTH EXTRACTION      reports that he quit smoking about 3 years ago. His smoking use included cigarettes. He smoked 1.00 pack per day. He has never used smokeless tobacco. He reports current alcohol use. He reports that he does not use drugs. family history includes Cancer in his maternal grandfather; Diabetes in his father and paternal grandfather; Heart attack in his mother; Heart attack (age of onset: 63) in his brother; Heart attack (age of onset: 1) in his maternal grandfather; Hypertension in his father; Stroke in his father and paternal uncle. Allergies  Allergen Reactions  . Adderall [Amphetamine-Dextroamphet Er] Diarrhea  . Prozac [Fluoxetine Hcl] Diarrhea  . Sertraline Hcl Diarrhea  . Other Diarrhea    Advil, ibuprofen, tylenol, (excedrin is OK)   Current Outpatient Medications on File Prior to Visit  Medication Sig Dispense Refill  . acetaminophen (TYLENOL) 500 MG tablet Take 2 tablets (1,000 mg total) by mouth every 6 (six) hours as needed. 30 tablet 0  . aspirin EC 81 MG tablet Take 1 tablet (81 mg total) by mouth daily. 30 tablet 0  . atorvastatin (LIPITOR) 80 MG tablet TAKE 1 TABLET(80 MG) BY MOUTH DAILY 90 tablet 3  . cyclobenzaprine (FLEXERIL) 10 MG tablet Take 1 tablet (10 mg total) by mouth 2 (two) times daily as needed for muscle spasms. [facility]  tablet 0  . diphenhydrAMINE (BENADRYL) 25 MG tablet Take 1-2 tablets (25-50 mg total) by mouth at bedtime as needed for sleep. 20 tablet 0  . erythromycin ophthalmic ointment Place a 1/2 inch ribbon of ointment into the lower eyelid. 1 g 0  . fenofibrate 54 MG tablet Take 1 tablet (54 mg total) by mouth daily. 90 tablet 3  . ferrous sulfate 325 (65 FE) MG tablet TAKE 1 TABLET BY MOUTH TWICE  DAILY WITH A MEAL 90 tablet 1  . furosemide (LASIX) 20 MG tablet Take 1 tablet (20 mg total) by mouth daily. 90 tablet 3  . guaiFENesin (MUCINEX) 600 MG 12 hr tablet Take 2 tablets (1,200 mg total) by mouth 2 (two) times daily as needed. 60 tablet 1  . HYDROcodone-acetaminophen (NORCO/VICODIN) 5-325 MG per tablet Take 1 tablet by mouth every 6 (six) hours as needed for moderate pain. 30 tablet 0  . lisinopril (PRINIVIL,ZESTRIL) 10 MG tablet Take 1 tablet (10 mg total) by mouth daily. 90 tablet 3  . metoprolol tartrate (LOPRESSOR) 25 MG tablet Take 0.5 tablets (12.5 mg total) by mouth 2 (two) times daily. 180 tablet 3  . Multiple Vitamins-Minerals (MULTIVITAL PO) Take 1 tablet by mouth daily.     . naltrexone (DEPADE) 50 MG tablet Take 1 tablet (50 mg total) by mouth daily. 90 tablet 3  . pantoprazole (PROTONIX) 40 MG tablet TAKE 1 TABLET(40 MG) BY MOUTH TWICE DAILY 180 tablet 3  . PARoxetine (PAXIL) 20 MG tablet Take 1 tablet (20 mg total) by mouth daily. 90 tablet 3   No current facility-administered medications on file prior to visit.    Review of Systems  Constitutional: Negative for other unusual diaphoresis or sweats HENT: Negative for ear discharge or swelling Eyes: Negative for other worsening visual disturbances Respiratory: Negative for stridor or other swelling  Gastrointestinal: Negative for worsening distension or other blood Genitourinary: Negative for retention or other urinary change Musculoskeletal: Negative for other MSK pain or swelling Skin: Negative for color change or other new lesions Neurological: Negative for worsening tremors and other numbness  Psychiatric/Behavioral: Negative for worsening agitation or other fatigue All other system neg per pt    Objective:   Physical Exam BP 122/84   Pulse 84   Temp 97.7 F (36.5 C) (Oral)   Ht 5\' 8"  (1.727 m)   Wt 200 lb (90.7 kg)   SpO2 94%   BMI 30.41 kg/m  VS noted,  Constitutional: Pt appears in NAD HENT: Head:  NCAT.  Right Ear: External ear normal.  Left Ear: External ear normal.  Eyes: . Pupils are equal, round, and reactive to light. Conjunctivae and EOM are normal Nose: without d/c or deformity Neck: Neck supple. Gross normal ROM Cardiovascular: Normal rate and regular rhythm.   Pulmonary/Chest: Effort normal and breath sounds without rales or wheezing.  Abd:  Soft, NT, ND, + BS, no organomegaly Neurological: Pt is alert. At baseline orientation, motor grossly intact Skin: Skin is warm. No rashes, other new lesions, no LE edema Psychiatric: Pt behavior is normal without agitation  No other exam findings Lab Results  Component Value Date   WBC 11.0 (H) 10/17/2017   HGB 11.3 (L) 10/17/2017   HCT 33.8 (L) 10/17/2017   PLT 296 10/17/2017   GLUCOSE 109 (H) 10/17/2017   CHOL 171 09/26/2017   TRIG 298.0 (H) 09/26/2017   HDL 47.00 09/26/2017   LDLDIRECT 90.0 09/26/2017   LDLCALC 67 09/30/2015   ALT 23 09/26/2017  AST 22 09/26/2017   NA 138 10/17/2017   K 4.2 10/17/2017   CL 101 10/17/2017   CREATININE 2.08 (H) 10/17/2017   BUN 32 (H) 10/17/2017   CO2 28 10/17/2017   TSH 3.05 09/26/2017   PSA 0.81 06/30/2015   INR 1.07 09/18/2015   HGBA1C 5.5 09/19/2015   MICROALBUR 0.8 07/15/2006       Assessment & Plan:

## 2018-11-17 ENCOUNTER — Ambulatory Visit: Payer: Medicaid Other | Admitting: Podiatry

## 2018-12-11 ENCOUNTER — Ambulatory Visit: Payer: Self-pay

## 2018-12-11 NOTE — Telephone Encounter (Signed)
Incoming call from Patient. Requesting his address be changed and email address be changed.  Requested refill of medications and changed pharmacy.

## 2018-12-16 ENCOUNTER — Other Ambulatory Visit: Payer: Self-pay

## 2018-12-16 MED ORDER — ATORVASTATIN CALCIUM 80 MG PO TABS: ORAL_TABLET | ORAL | 1 refills | Status: DC

## 2018-12-16 MED ORDER — METOPROLOL TARTRATE 25 MG PO TABS: 12.5000 mg | ORAL_TABLET | Freq: Two times a day (BID) | ORAL | 1 refills | Status: DC

## 2018-12-30 ENCOUNTER — Telehealth: Payer: Self-pay | Admitting: Podiatry

## 2018-12-30 NOTE — Telephone Encounter (Signed)
Pt has called in and wanted to know if could he still be seen at 8:30am tomorrow in [place]. Office hours do start at 9:00am. It is a Dr. Jenne Campus patient and we have no availability to move him into another time slot. I wanted to know do you mind seeing the patient at 8:30am. Also this patient was very rude and disrespectful to me about the situation, I was willing to problem solve and work it out for him to be seen. I was trying to explain to him about the new office hours and how I was going to contact(you) Dr. Amalia Hailey and give him a call back. Please let me know if you can accommodate.

## 2018-12-30 NOTE — Telephone Encounter (Signed)
Yes I will see him if someone will be at the front desk to check him in... but I'll definitely see him.

## 2018-12-31 ENCOUNTER — Ambulatory Visit: Payer: Medicaid Other | Admitting: Podiatry

## 2019-01-05 ENCOUNTER — Ambulatory Visit: Payer: Medicaid Other | Admitting: Podiatry

## 2019-01-05 ENCOUNTER — Other Ambulatory Visit: Payer: Self-pay

## 2019-01-05 ENCOUNTER — Other Ambulatory Visit: Payer: Self-pay | Admitting: Podiatry

## 2019-01-05 ENCOUNTER — Ambulatory Visit: Payer: Medicaid Other

## 2019-01-05 ENCOUNTER — Encounter: Payer: Self-pay | Admitting: Podiatry

## 2019-01-05 ENCOUNTER — Ambulatory Visit (INDEPENDENT_AMBULATORY_CARE_PROVIDER_SITE_OTHER): Payer: Medicaid Other

## 2019-01-05 VITALS — Temp 97.7°F

## 2019-01-05 DIAGNOSIS — M21621 Bunionette of right foot: Secondary | ICD-10-CM

## 2019-01-05 DIAGNOSIS — M79671 Pain in right foot: Secondary | ICD-10-CM | POA: Diagnosis not present

## 2019-01-05 DIAGNOSIS — M21622 Bunionette of left foot: Secondary | ICD-10-CM

## 2019-01-05 DIAGNOSIS — M79672 Pain in left foot: Secondary | ICD-10-CM

## 2019-01-05 DIAGNOSIS — M21611 Bunion of right foot: Secondary | ICD-10-CM

## 2019-01-05 NOTE — Patient Instructions (Signed)
Pre-Operative Instructions  Congratulations, you have decided to take an important step towards improving your quality of life.  You can be assured that the doctors and staff at [facility] will be with you every step of the way.  Here are some important things you should know:  1. Plan to be at the surgery center/hospital at least 1 (one) hour prior to your scheduled time, unless otherwise directed by the surgical center/hospital staff.  You must have a responsible adult accompany you, remain during the surgery and drive you home.  Make sure you have directions to the surgical center/hospital to ensure you arrive on time. 2. If you are having surgery at [facility] or [facility], you will need a copy of your medical history and physical form from your family physician within one month prior to the date of surgery. We will give you a form for your primary physician to complete.  3. We make every effort to accommodate the date you request for surgery.  However, there are times where surgery dates or times have to be moved.  We will contact you as soon as possible if a change in schedule is required.   4. No aspirin/ibuprofen for one week before surgery.  If you are on aspirin, any non-steroidal anti-inflammatory medications (Mobic, Aleve, Ibuprofen) should not be taken seven (7) days prior to your surgery.  You make take Tylenol for pain prior to surgery.  5. Medications - If you are taking daily heart and blood pressure medications, seizure, reflux, allergy, asthma, anxiety, pain or diabetes medications, make sure you notify the surgery center/hospital before the day of surgery so they can tell you which medications you should take or avoid the day of surgery. 6. No food or drink after midnight the night before surgery unless directed otherwise by surgical center/hospital staff. 7. No alcoholic beverages 24-hours prior to surgery.  No smoking 24-hours prior or 24-hours after  surgery. 8. Wear loose pants or shorts. They should be loose enough to fit over bandages, boots, and casts. 9. Don't wear slip-on shoes. Sneakers are preferred. 10. Bring your boot with you to the surgery center/hospital.  Also bring crutches or a walker if your physician has prescribed it for you.  If you do not have this equipment, it will be provided for you after surgery. 11. If you have not been contacted by the surgery center/hospital by the day before your surgery, call to confirm the date and time of your surgery. 12. Leave-time from work may vary depending on the type of surgery you have.  Appropriate arrangements should be made prior to surgery with your employer. 13. Prescriptions will be provided immediately following surgery by your doctor.  Fill these as soon as possible after surgery and take the medication as directed. Pain medications will not be refilled on weekends and must be approved by the doctor. 14. Remove nail polish on the operative foot and avoid getting pedicures prior to surgery. 15. Wash the night before surgery.  The night before surgery wash the foot and leg well with water and the antibacterial soap provided. Be sure to pay special attention to beneath the toenails and in between the toes.  Wash for at least three (3) minutes. Rinse thoroughly with water and dry well with a towel.  Perform this wash unless told not to do so by your physician.  Enclosed: 1 Ice pack (please put in freezer the night before surgery)   1 Hibiclens skin cleaner     Pre-op instructions  If you have any questions regarding the instructions, please do not hesitate to call our office.  [place]: [address], [place], [place] [address] -- [phone]  [place]: [address] [place], [place] [address] -- [phone]  [place]: [address]  [place], [place] [address] -- [phone]  [place]: [address], [place], [place] [address] -- [phone]  Website:  [email or web address] 

## 2019-01-08 NOTE — Progress Notes (Signed)
   Subjective: 58 year old male presenting today as a new patient, referred by Dr. Gershon Mussel, with a chief complaint of pain to the dorsolateral aspect of the bilateral feet that has been ongoing for the past 1-2 years. He reports some associated numbness of the area. He states he tends to walk on the lateral aspects of his feet which increases the pain. He has tried wearing new shoes and has taken OTC pain medications with no significant relief. Patient is here for further evaluation and treatment.   Past Medical History:  Diagnosis Date  . Anemia, unspecified   . Arthritis   . Diverticulosis of colon (without mention of hemorrhage)   . Gastric ulcer with hemorrhage and obstruction 2011   Dr [facility]  . Hyperlipemia   . Hypertension   . Personal history of colonic polyps 01/30/2010   hyperplastic rectal  . Sleep apnea    on CPAP; [facility]     Objective: Physical Exam General: The patient is alert and oriented x3 in no acute distress.  Dermatology: Skin is cool, dry and supple bilateral lower extremities. Negative for open lesions or macerations.  Vascular: Palpable pedal pulses bilaterally. No edema or erythema noted. Capillary refill within normal limits.  Neurological: Epicritic and protective threshold grossly intact bilaterally.   Musculoskeletal Exam: Clinical evidence of Tailor's bunion deformity noted to the respective foot. There is a moderate pain on palpation range of motion of the fifth MPJ.   Radiographic Exam: Increased intermetatarsal angle to the fourth interspace of the respective foot. Prominent fifth metatarsal head. Joint spaces preserved.   Assessment: 1. Tailor's bunion deformity bilateral    Plan of Care:  1. Patient was evaluated. X-Rays reviewed.  2. Today we discussed the conservative versus surgical management of the presenting pathology. The patient opts for surgical management. All possible complications and details of the  procedure were explained. All patient questions were answered. No guarantees were expressed or implied. 3. Authorization for surgery was initiated today. Surgery will consist of Tailor's bunionectomy with osteotomy left. Right foot surgery will be after daughter's wedding in July.  4. Medical clearance required from his PCP.  5. CAM boot dispensed.  6. Return to clinic one week post op.   Works part-time at [employer]      Edrick Kins, DPM [facility]  Dr. Edrick Kins, [place]                                        [place], [place] [address]                Office [phone]  Fax [phone]

## 2019-02-04 ENCOUNTER — Telehealth: Payer: Self-pay | Admitting: *Deleted

## 2019-02-04 NOTE — Telephone Encounter (Signed)
"  I need to speak to you about my surgery.  It looks like I'm going to need to postpone it.  Please give me a call when you get this message."

## 2019-02-06 NOTE — Telephone Encounter (Signed)
"  I called the other day and no one has called me back.  Why is that?  I am trying to reschedule my surgery."  My apologies, I am the only scheduler for all three offices.  I was going to call you today.  "I'm scheduled for February 19, 2019.  I'd like to reschedule that to the end of July or the first of August."  Dr. Amalia Hailey can do it on April 02, 2019.  "That date will be fine."  I'll get it rescheduled.  I rescheduled his surgery from 02/19/2019 to 04/02/2019 via the surgical center's [facility] [employer] Portal.

## 2019-02-25 ENCOUNTER — Other Ambulatory Visit: Payer: Medicaid Other

## 2019-02-26 ENCOUNTER — Telehealth: Payer: Self-pay | Admitting: Podiatry

## 2019-02-26 ENCOUNTER — Encounter: Payer: Self-pay | Admitting: Podiatry

## 2019-02-26 NOTE — Telephone Encounter (Signed)
Patient needs a letter for his employer stating type of surgery and amount of time he is expected to be out. Patient will pick up letter

## 2019-03-24 ENCOUNTER — Encounter: Payer: Self-pay | Admitting: *Deleted

## 2019-03-24 NOTE — Progress Notes (Signed)
Per Dr. Amalia Hailey, I sent a surgical medical clearance request letter to D. [name]. Martin Archer is scheduled for an outpatient surgery on 04/02/2019.

## 2019-03-25 ENCOUNTER — Other Ambulatory Visit: Payer: Medicaid Other

## 2019-03-27 ENCOUNTER — Telehealth: Payer: Self-pay | Admitting: *Deleted

## 2019-03-27 NOTE — Telephone Encounter (Signed)
I left the patient a message that we received clearance from Dr. Jenny Reichmann.  Dr. Jenny Reichmann said to stop the Aspirin 81 mg, seven days before the surgery date.

## 2019-03-27 NOTE — Telephone Encounter (Signed)
Pt called back about a missed phone call. Told him what Delydia said and pt then asked what time his surgery would be. I told him to expect a call on Tuesday or Wednesday for his arrival and surgery time.

## 2019-04-01 ENCOUNTER — Other Ambulatory Visit: Payer: Medicaid Other

## 2019-04-02 ENCOUNTER — Other Ambulatory Visit: Payer: Self-pay | Admitting: Podiatry

## 2019-04-02 DIAGNOSIS — M21541 Acquired clubfoot, right foot: Secondary | ICD-10-CM

## 2019-04-02 MED ORDER — OXYCODONE-ACETAMINOPHEN 5-325 MG PO TABS: 1.0000 | ORAL_TABLET | Freq: Four times a day (QID) | ORAL | 0 refills | Status: DC | PRN

## 2019-04-02 NOTE — Progress Notes (Signed)
.  postop

## 2019-04-06 ENCOUNTER — Telehealth: Payer: Self-pay

## 2019-04-06 ENCOUNTER — Encounter: Payer: Self-pay | Admitting: Podiatry

## 2019-04-06 NOTE — Telephone Encounter (Signed)
Called pt post surgery; no answer; left VM for patient to call with any questions or concerns he may have before next POV on 04/08/2019 at 10:30am

## 2019-04-08 ENCOUNTER — Encounter: Payer: Self-pay | Admitting: Podiatry

## 2019-04-08 ENCOUNTER — Other Ambulatory Visit: Payer: Self-pay

## 2019-04-08 ENCOUNTER — Ambulatory Visit (INDEPENDENT_AMBULATORY_CARE_PROVIDER_SITE_OTHER): Payer: Medicaid Other

## 2019-04-08 ENCOUNTER — Ambulatory Visit (INDEPENDENT_AMBULATORY_CARE_PROVIDER_SITE_OTHER): Payer: Self-pay | Admitting: Podiatry

## 2019-04-08 VITALS — Temp 97.8°F

## 2019-04-08 DIAGNOSIS — Z09 Encounter for follow-up examination after completed treatment for conditions other than malignant neoplasm: Secondary | ICD-10-CM

## 2019-04-08 DIAGNOSIS — M21622 Bunionette of left foot: Secondary | ICD-10-CM | POA: Diagnosis not present

## 2019-04-12 NOTE — Progress Notes (Signed)
   Subjective:  Patient presents today status post Tailor's bunionectomy left. DOS: 04/02/2019. He states he is doing well. He denies any significant pain or modifying factors. He has been using the CAM boot as directed. Patient is here for further evaluation and treatment.    Past Medical History:  Diagnosis Date  . Anemia, unspecified   . Arthritis   . Diverticulosis of colon (without mention of hemorrhage)   . Gastric ulcer with hemorrhage and obstruction 2011   Dr [facility]  . Hyperlipemia   . Hypertension   . Personal history of colonic polyps 01/30/2010   hyperplastic rectal  . Sleep apnea    on CPAP; [facility]      Objective/Physical Exam Neurovascular status intact.  Skin incisions appear to be well coapted with sutures and staples intact. No sign of infectious process noted. No dehiscence. No active bleeding noted. Moderate edema noted to the surgical extremity.  Radiographic Exam:  Orthopedic hardware and osteotomies sites appear to be stable with routine healing.  Assessment: 1. s/p Tailor's bunionectomy left. DOS: 04/02/2019   Plan of Care:  1. Patient was evaluated. X-rays reviewed 2. Dressing changed.  3. Continue weightbearing in CAM boot.  4. At 4 weeks post op we will schedule surgery for the right foot.  5. Return to clinic in one week for staples removal.    Edrick Kins, DPM [facility]  Dr. Edrick Kins, [address] [place]                                        [place], [place] [address]                Office [phone]  Fax [phone]

## 2019-04-15 ENCOUNTER — Encounter: Payer: Self-pay | Admitting: Podiatry

## 2019-04-15 ENCOUNTER — Ambulatory Visit (INDEPENDENT_AMBULATORY_CARE_PROVIDER_SITE_OTHER): Payer: Medicaid Other | Admitting: Podiatry

## 2019-04-15 ENCOUNTER — Other Ambulatory Visit: Payer: Self-pay

## 2019-04-15 VITALS — Temp 97.3°F

## 2019-04-15 DIAGNOSIS — M21622 Bunionette of left foot: Secondary | ICD-10-CM | POA: Diagnosis not present

## 2019-04-15 DIAGNOSIS — Z09 Encounter for follow-up examination after completed treatment for conditions other than malignant neoplasm: Secondary | ICD-10-CM

## 2019-04-17 NOTE — Progress Notes (Signed)
   Subjective:  Patient presents today status post Tailor's bunionectomy left. DOS: 04/02/2019. He states he is doing well. He denies any pain or modifying factors. He has been using the CAM boot as directed. Patient is here for further evaluation and treatment.    Past Medical History:  Diagnosis Date  . Anemia, unspecified   . Arthritis   . Diverticulosis of colon (without mention of hemorrhage)   . Gastric ulcer with hemorrhage and obstruction 2011   Dr F[facility]  . Hyperlipemia   . Hypertension   . Personal history of colonic polyps 01/30/2010   hyperplastic rectal  . Sleep apnea    on CPAP; [facility]      Objective/Physical Exam Neurovascular status intact.  Skin incisions appear to be well coapted with sutures and staples intact. No sign of infectious process noted. No dehiscence. No active bleeding noted. Moderate edema noted to the surgical extremity.  Assessment: 1. s/p Tailor's bunionectomy left. DOS: 04/02/2019   Plan of Care:  1. Patient was evaluated.  2. Staples removed.  3. Compression anklet dispensed.  4. Post op shoe dispensed. Discontinue using CAM boot.  5. Return to clinic in 2 weeks for surgical consult for right foot Tailor's bunion.    Edrick Kins, DPM [facility]  Dr. Edrick Kins, [place]                                        [place], [place] [address]                Office [phone]  Fax [phone]

## 2019-04-29 ENCOUNTER — Encounter: Payer: Self-pay | Admitting: Podiatry

## 2019-04-29 ENCOUNTER — Ambulatory Visit (INDEPENDENT_AMBULATORY_CARE_PROVIDER_SITE_OTHER): Payer: Medicaid Other

## 2019-04-29 ENCOUNTER — Other Ambulatory Visit: Payer: Self-pay

## 2019-04-29 ENCOUNTER — Ambulatory Visit (INDEPENDENT_AMBULATORY_CARE_PROVIDER_SITE_OTHER): Payer: Medicaid Other | Admitting: Podiatry

## 2019-04-29 DIAGNOSIS — M21621 Bunionette of right foot: Secondary | ICD-10-CM

## 2019-04-29 DIAGNOSIS — Z09 Encounter for follow-up examination after completed treatment for conditions other than malignant neoplasm: Secondary | ICD-10-CM

## 2019-04-29 DIAGNOSIS — M21622 Bunionette of left foot: Secondary | ICD-10-CM | POA: Diagnosis not present

## 2019-04-29 NOTE — Patient Instructions (Signed)
Pre-Operative Instructions  Congratulations, you have decided to take an important step towards improving your quality of life.  You can be assured that the doctors and staff at [facility] will be with you every step of the way.  Here are some important things you should know:  1. Plan to be at the surgery center/hospital at least 1 (one) hour prior to your scheduled time, unless otherwise directed by the surgical center/hospital staff.  You must have a responsible adult accompany you, remain during the surgery and drive you home.  Make sure you have directions to the surgical center/hospital to ensure you arrive on time. 2. If you are having surgery at [facility]als, you will need a copy of your medical history and physical form from your family physician within one month prior to the date of surgery. We will give you a form for your primary physician to complete.  3. We make every effort to accommodate the date you request for surgery.  However, there are times where surgery dates or times have to be moved.  We will contact you as soon as possible if a change in schedule is required.   4. No aspirin/ibuprofen for one week before surgery.  If you are on aspirin, any non-steroidal anti-inflammatory medications (Mobic, Aleve, Ibuprofen) should not be taken seven (7) days prior to your surgery.  You make take Tylenol for pain prior to surgery.  5. Medications - If you are taking daily heart and blood pressure medications, seizure, reflux, allergy, asthma, anxiety, pain or diabetes medications, make sure you notify the surgery center/hospital before the day of surgery so they can tell you which medications you should take or avoid the day of surgery. 6. No food or drink after midnight the night before surgery unless directed otherwise by surgical center/hospital staff. 7. No alcoholic beverages 24-hours prior to surgery.  No smoking 24-hours prior or 24-hours after  surgery. 8. Wear loose pants or shorts. They should be loose enough to fit over bandages, boots, and casts. 9. Don't wear slip-on shoes. Sneakers are preferred. 10. Bring your boot with you to the surgery center/hospital.  Also bring crutches or a walker if your physician has prescribed it for you.  If you do not have this equipment, it will be provided for you after surgery. 11. If you have not been contacted by the surgery center/hospital by the day before your surgery, call to confirm the date and time of your surgery. 12. Leave-time from work may vary depending on the type of surgery you have.  Appropriate arrangements should be made prior to surgery with your employer. 13. Prescriptions will be provided immediately following surgery by your doctor.  Fill these as soon as possible after surgery and take the medication as directed. Pain medications will not be refilled on weekends and must be approved by the doctor. 14. Remove nail polish on the operative foot and avoid getting pedicures prior to surgery. 15. Wash the night before surgery.  The night before surgery wash the foot and leg well with water and the antibacterial soap provided. Be sure to pay special attention to beneath the toenails and in between the toes.  Wash for at least three (3) minutes. Rinse thoroughly with water and dry well with a towel.  Perform this wash unless told not to do so by your physician.  Enclosed: 1 Ice pack (please put in freezer the night before surgery)   1 Hibiclens skin cleaner     Pre-op instructions  If you have any questions regarding the instructions, please do not hesitate to call our office.  [place]: [address], [place], [place] [address] -- [phone]  [place]: [address] [place], [place] [address] -- [phone]  [place]: [address]  [place], [place] [address] -- [phone]  [place]: [address], [place], [place] [address] -- [phone]  Website:  [email or web address] 

## 2019-05-03 NOTE — Progress Notes (Signed)
   Subjective: 58 y.o. male presenting today s/p Tailor's bunionectomy left. DOS: 04/02/2019. He states he is doing well. He denies any significant pain except when the foot swells. He states the swelling is intermittent and minimal. He has been using the compression sleeve for treatment. He is interested in discussing surgical treatment for the Tailor's bunion of the right foot. Patient is here for further evaluation and treatment.   Past Medical History:  Diagnosis Date  . Anemia, unspecified   . Arthritis   . Diverticulosis of colon (without mention of hemorrhage)   . Gastric ulcer with hemorrhage and obstruction 2011   Dr Fidel[facility]  . Hyperlipemia   . Hypertension   . Personal history of colonic polyps 01/30/2010   hyperplastic rectal  . Sleep apnea    on CPAP; [facility]     Objective: Physical Exam General: The patient is alert and oriented x3 in no acute distress.  Dermatology: Skin is cool, dry and supple bilateral lower extremities. Negative for open lesions or macerations.  Vascular: Palpable pedal pulses bilaterally. No erythema noted. Capillary refill within normal limits.  Neurological: Epicritic and protective threshold grossly intact bilaterally.   Musculoskeletal Exam: Skin incisions appear to be well coapted with sutures and staples intact. No sign of infectious process noted. No dehiscence. No active bleeding noted. Moderate edema noted to the surgical extremity. Clinical evidence of Tailor's bunion deformity noted to the respective foot. There is a moderate pain on palpation range of motion of the fifth MPJ.   Radiographic Exam:  Orthopedic hardware and osteotomies sites appear to be stable with routine healing.   Assessment: 1. Tailor's bunion deformity right 2. S/p Tailor's bunionectomy left DOS: 04/02/2019   Plan of Care:  1. Patient was evaluated. X-Rays reviewed.  2. Discontinue using post op shoe.  3. Recommended good shoe gear.   4. Today we discussed the conservative versus surgical management of the presenting pathology. The patient opts for surgical management. All possible complications and details of the procedure were explained. All patient questions were answered. No guarantees were expressed or implied. 5. Authorization for surgery was initiated today. Surgery will consist of Tailor's bunionectomy with 5th metatarsal osteotomy right.  6. Return to clinic one week post op.      Edrick Kins, DPM [facility]  Dr. Edrick Kins, [place]                                        [place], [place] [address]                Office [phone]  Fax [phone]

## 2019-05-29 ENCOUNTER — Telehealth: Payer: Self-pay | Admitting: *Deleted

## 2019-05-29 NOTE — Telephone Encounter (Signed)
Looked on line for the medicaid card and it was verified stating the patient had insurance as of 05/29/2019. Martin Archer

## 2019-06-18 ENCOUNTER — Encounter: Payer: Self-pay | Admitting: Podiatry

## 2019-06-18 ENCOUNTER — Other Ambulatory Visit: Payer: Self-pay | Admitting: Podiatry

## 2019-06-18 DIAGNOSIS — M21541 Acquired clubfoot, right foot: Secondary | ICD-10-CM

## 2019-06-18 MED ORDER — OXYCODONE-ACETAMINOPHEN 5-325 MG PO TABS: 1.0000 | ORAL_TABLET | Freq: Four times a day (QID) | ORAL | 0 refills | Status: DC | PRN

## 2019-06-18 NOTE — Progress Notes (Signed)
Post op

## 2019-06-22 ENCOUNTER — Other Ambulatory Visit: Payer: Self-pay

## 2019-06-22 ENCOUNTER — Ambulatory Visit (INDEPENDENT_AMBULATORY_CARE_PROVIDER_SITE_OTHER): Payer: Medicaid Other | Admitting: Podiatry

## 2019-06-22 DIAGNOSIS — M21622 Bunionette of left foot: Secondary | ICD-10-CM

## 2019-06-22 DIAGNOSIS — Z09 Encounter for follow-up examination after completed treatment for conditions other than malignant neoplasm: Secondary | ICD-10-CM

## 2019-06-22 DIAGNOSIS — M21621 Bunionette of right foot: Secondary | ICD-10-CM

## 2019-06-22 MED ORDER — DOXYCYCLINE HYCLATE 100 MG PO TABS: 100.0000 mg | ORAL_TABLET | Freq: Two times a day (BID) | ORAL | 0 refills | Status: DC

## 2019-06-23 ENCOUNTER — Ambulatory Visit (INDEPENDENT_AMBULATORY_CARE_PROVIDER_SITE_OTHER): Payer: Medicaid Other

## 2019-06-23 DIAGNOSIS — M21622 Bunionette of left foot: Secondary | ICD-10-CM

## 2019-06-25 NOTE — Progress Notes (Signed)
   Subjective:  Patient presents today status post Tailor's bunionectomy right. DOS: 06/18/2019. He states he is doing well. He reports some pain the first couple days after surgery but states it has since resolved. He has bene using the CAM boot as directed. Patient is here for further evaluation and treatment.    Past Medical History:  Diagnosis Date  . Anemia, unspecified   . Arthritis   . Diverticulosis of colon (without mention of hemorrhage)   . Gastric ulcer with hemorrhage and obstruction 2011   Dr Fidela [facility]  . Hyperlipemia   . Hypertension   . Personal history of colonic polyps 01/30/2010   hyperplastic rectal  . Sleep apnea    on CPAP; [facility]      Objective/Physical Exam Neurovascular status intact.  Skin incisions appear to be well coapted with sutures and staples intact. No sign of infectious process noted. No dehiscence. No active bleeding noted. Moderate edema noted to the surgical extremity.  Radiographic Exam:  Orthopedic hardware and osteotomies sites appear to be stable with routine healing.  Assessment: 1. s/p Tailor's bunionectomy right. DOS: 06/18/2019   Plan of Care:  1. Patient was evaluated. X-rays reviewed 2. Dressing changed.  3. Continue using CAM boot weightbearing as tolerated.  4. Prescription for Doxycycline provided to patient due to mild increased erythema.  5. Return to clinic in one week.    Edrick Kins, DPM [facility]  Dr. Edrick Kins, [address] [place]                                        [place], [place] [address]                Office [phone]  Fax [phone]

## 2019-07-01 ENCOUNTER — Ambulatory Visit (INDEPENDENT_AMBULATORY_CARE_PROVIDER_SITE_OTHER): Payer: Self-pay | Admitting: Podiatry

## 2019-07-01 ENCOUNTER — Other Ambulatory Visit: Payer: Self-pay

## 2019-07-01 ENCOUNTER — Encounter: Payer: Self-pay | Admitting: Podiatry

## 2019-07-01 DIAGNOSIS — Z09 Encounter for follow-up examination after completed treatment for conditions other than malignant neoplasm: Secondary | ICD-10-CM

## 2019-07-01 DIAGNOSIS — M21621 Bunionette of right foot: Secondary | ICD-10-CM

## 2019-07-06 NOTE — Progress Notes (Signed)
   Subjective:  Patient presents today status post Tailor's bunionectomy right. DOS: 06/18/2019. He states he is doing well. He denies any significant pain or modifying factors. He has been using the CAM boot as directed. Patient is here for further evaluation and treatment.    Past Medical History:  Diagnosis Date  . Anemia, unspecified   . Arthritis   . Diverticulosis of colon (without mention of hemorrhage)   . Gastric ulcer with hemorrhage and obstruction 2011   Dr Fidela [facility]  . Hyperlipemia   . Hypertension   . Personal history of colonic polyps 01/30/2010   hyperplastic rectal  . Sleep apnea    on CPAP; [facility]      Objective/Physical Exam Neurovascular status intact.  Skin incisions appear to be well coapted with sutures and staples intact. No sign of infectious process noted. No dehiscence. No active bleeding noted. Moderate edema noted to the surgical extremity.  Assessment: 1. s/p Tailor's bunionectomy right. DOS: 06/18/2019   Plan of Care:  1. Patient was evaluated.  2. Suture removed.  3. Continue using CAM boot for two weeks.  4. Return to clinic in 2 weeks.    Edrick Kins, DPM [facility]  Dr. Edrick Kins, [place]                                        [place], [place] [address]                Office [phone]  Fax [phone]

## 2019-07-15 ENCOUNTER — Other Ambulatory Visit: Payer: Self-pay

## 2019-07-15 ENCOUNTER — Ambulatory Visit (INDEPENDENT_AMBULATORY_CARE_PROVIDER_SITE_OTHER): Payer: Medicaid Other

## 2019-07-15 ENCOUNTER — Ambulatory Visit (INDEPENDENT_AMBULATORY_CARE_PROVIDER_SITE_OTHER): Payer: Medicaid Other | Admitting: Podiatry

## 2019-07-15 DIAGNOSIS — M216X1 Other acquired deformities of right foot: Secondary | ICD-10-CM | POA: Diagnosis not present

## 2019-07-15 DIAGNOSIS — M21621 Bunionette of right foot: Secondary | ICD-10-CM

## 2019-07-15 DIAGNOSIS — Z09 Encounter for follow-up examination after completed treatment for conditions other than malignant neoplasm: Secondary | ICD-10-CM

## 2019-07-15 NOTE — Progress Notes (Signed)
DG  °

## 2019-07-16 ENCOUNTER — Telehealth: Payer: Self-pay | Admitting: Podiatry

## 2019-07-16 MED ORDER — OXYCODONE-ACETAMINOPHEN 5-325 MG PO TABS: 1.0000 | ORAL_TABLET | Freq: Four times a day (QID) | ORAL | 0 refills | Status: DC | PRN

## 2019-07-16 NOTE — Telephone Encounter (Signed)
Rx sent. I was having trouble with the computer yesterday sending the rx electronically.  - Dr. Amalia Hailey

## 2019-07-16 NOTE — Telephone Encounter (Signed)
Patient  Called to say that Dr. Amalia Hailey was suppose to call in a refill on pain meds but has not DOS 06/18/2019 would like that refill when he can get it [facility] [address]  Is the pharmacy

## 2019-07-26 NOTE — Progress Notes (Signed)
   Subjective:  Patient presents today status post Tailor's bunionectomy right. DOS: 06/18/2019. He reports continued pain in the foot. Being active on the foot increases the pain. Using compression helps alleviate his pain but causes his toes to become discolored. He has been taking Percocet for his symptoms which helps alleviate them. Patient is here for further evaluation and treatment.    Past Medical History:  Diagnosis Date  . Anemia, unspecified   . Arthritis   . Diverticulosis of colon (without mention of hemorrhage)   . Gastric ulcer with hemorrhage and obstruction 2011   Dr Fidel[facility]  . Hyperlipemia   . Hypertension   . Personal history of colonic polyps 01/30/2010   hyperplastic rectal  . Sleep apnea    on CPAP; [facility]      Objective/Physical Exam Neurovascular status intact.  Skin incisions appear to be well coapted. No sign of infectious process noted. No dehiscence. No active bleeding noted. Moderate edema noted to the surgical extremity.  Radiographic Exam:  Orthopedic hardware and osteotomies sites appear to be stable with routine healing.  Assessment: 1. s/p Tailor's bunionectomy right. DOS: 06/18/2019   Plan of Care:  1. Patient was evaluated. X-Rays reviewed.  2. Continue using post op shoe.  3. Refill prescription for Percocet 5/325 mg provided to patient.  4. Return to clinic in 2 months for final follow up X-Ray.     Edrick Kins, DPM [facility]  Dr. Edrick Kins, [place]                                        [place], [place] [address]                Office [phone]  Fax [phone]

## 2019-07-28 ENCOUNTER — Other Ambulatory Visit: Payer: Self-pay | Admitting: Podiatry

## 2019-07-28 ENCOUNTER — Telehealth: Payer: Self-pay | Admitting: Podiatry

## 2019-07-28 MED ORDER — OXYCODONE-ACETAMINOPHEN 5-325 MG PO TABS: 1.0000 | ORAL_TABLET | Freq: Four times a day (QID) | ORAL | 0 refills | Status: DC | PRN

## 2019-07-28 NOTE — Telephone Encounter (Signed)
Refill sent to pharmacy.   

## 2019-07-28 NOTE — Telephone Encounter (Signed)
Left message requesting a call back from pt with surgery foot pain status, so Dr. Amalia Hailey can prescribe the pain medication at a proper dose.

## 2019-07-28 NOTE — Progress Notes (Signed)
PRN postop pain 

## 2019-07-28 NOTE — Telephone Encounter (Signed)
Pt called states the surgery foot still has swelling and he can't walk in regular shoes more than couple 100 feet so tries to rest more and is having to work. Pt states the left foot healed better. Pt states varying pain can be sharp and occasionally severe in the right surgery foot. I told pt that he should not take the pain medication and work. Pt states understanding. I told pt I would inform Dr. Amalia Hailey and check his pharmacy later today.

## 2019-07-28 NOTE — Telephone Encounter (Signed)
Pt requesting a refill on pain meds from [facility] 06/18/2019

## 2019-09-02 ENCOUNTER — Encounter: Payer: Medicaid Other | Admitting: Podiatry

## 2019-09-14 ENCOUNTER — Ambulatory Visit (INDEPENDENT_AMBULATORY_CARE_PROVIDER_SITE_OTHER): Payer: Medicaid Other

## 2019-09-14 ENCOUNTER — Ambulatory Visit (INDEPENDENT_AMBULATORY_CARE_PROVIDER_SITE_OTHER): Payer: Medicaid Other | Admitting: Podiatry

## 2019-09-14 ENCOUNTER — Other Ambulatory Visit: Payer: Self-pay

## 2019-09-14 DIAGNOSIS — M21621 Bunionette of right foot: Secondary | ICD-10-CM

## 2019-09-14 DIAGNOSIS — M216X1 Other acquired deformities of right foot: Secondary | ICD-10-CM | POA: Diagnosis not present

## 2019-09-14 MED ORDER — MELOXICAM 15 MG PO TABS: 15.0000 mg | ORAL_TABLET | Freq: Every day | ORAL | 1 refills | Status: DC

## 2019-09-17 NOTE — Progress Notes (Signed)
   Subjective:  Patient presents today status post Tailor's bunionectomy right. DOS: 06/18/2019. He reports some continued intermittent pain but states it is not as severe as it was prior to surgery. He has been taking Percocet for treatment of the pain. Being on the foot for long periods of time increases the pain. Patient is here for further evaluation and treatment.    Past Medical History:  Diagnosis Date  . Anemia, unspecified   . Arthritis   . Diverticulosis of colon (without mention of hemorrhage)   . Gastric ulcer with hemorrhage and obstruction 2011   Dr [facility]  . Hyperlipemia   . Hypertension   . Personal history of colonic polyps 01/30/2010   hyperplastic rectal  . Sleep apnea    on CPAP; [facility]      Objective/Physical Exam Neurovascular status intact.  Skin incisions appear to be well coapted. No sign of infectious process noted. No dehiscence. No active bleeding noted. Moderate edema noted to the surgical extremity.  Radiographic Exam:  Orthopedic hardware and osteotomies sites appear to be stable with routine healing.  Assessment: 1. s/p Tailor's bunionectomy right. DOS: 06/18/2019   Plan of Care:  1. Patient was evaluated. X-Rays reviewed.  2. May resume full activity with no restrictions.  3. Recommended good shoe gear.  4. Refill prescription for Meloxicam 15 mg provided to patient.  5. Return to clinic as needed.    Edrick Kins, DPM [facility]  Dr. Edrick Kins, [address]                                        [place], [place] [address]                Office [phone]  Fax [phone]

## 2019-11-11 ENCOUNTER — Other Ambulatory Visit: Payer: Self-pay | Admitting: Internal Medicine

## 2019-11-11 NOTE — Telephone Encounter (Signed)
Please refill as per office routine med refill policy (all routine meds refilled for 3 mo or monthly per pt preference up to one year from last visit, then month to month grace period for 3 mo, then further med refills will have to be denied)  

## 2019-12-17 ENCOUNTER — Other Ambulatory Visit: Payer: Self-pay | Admitting: Internal Medicine

## 2019-12-17 NOTE — Telephone Encounter (Signed)
Please refill as per office routine med refill policy (all routine meds refilled for 3 mo or monthly per pt preference up to one year from last visit, then month to month grace period for 3 mo, then further med refills will have to be denied)  

## 2020-01-15 ENCOUNTER — Other Ambulatory Visit: Payer: Self-pay | Admitting: Internal Medicine

## 2020-01-15 NOTE — Telephone Encounter (Signed)
rx refill done 1 mo only  Please to let pt know - no further refills possible without ROV

## 2020-01-19 ENCOUNTER — Other Ambulatory Visit: Payer: Self-pay

## 2020-01-19 MED ORDER — MELOXICAM 15 MG PO TABS: 15.0000 mg | ORAL_TABLET | Freq: Every day | ORAL | 1 refills | Status: DC

## 2020-02-10 ENCOUNTER — Encounter: Payer: Self-pay | Admitting: Gastroenterology

## 2020-02-26 ENCOUNTER — Other Ambulatory Visit: Payer: Self-pay | Admitting: Internal Medicine

## 2020-02-26 ENCOUNTER — Telehealth: Payer: Self-pay

## 2020-02-26 NOTE — Telephone Encounter (Signed)
Ok to let pt know -  unfortunatley due to office med refill policy we are unable to refill any further meds since he has not been seen here in the last 12 months + 3 months grace period (total 15 mo)  Last ov was mar 2020

## 2020-02-26 NOTE — Telephone Encounter (Signed)
New message    The patient is asking for lab work before appt at end of July.

## 2020-02-26 NOTE — Telephone Encounter (Signed)
Very sorry, I am unable as his primary insurance will not pay for this

## 2020-03-01 NOTE — Telephone Encounter (Signed)
LVM to inform patient of information below.

## 2020-03-23 ENCOUNTER — Encounter: Payer: Self-pay | Admitting: Internal Medicine

## 2020-03-23 ENCOUNTER — Ambulatory Visit (INDEPENDENT_AMBULATORY_CARE_PROVIDER_SITE_OTHER): Payer: Medicare Other | Admitting: Internal Medicine

## 2020-03-23 ENCOUNTER — Other Ambulatory Visit: Payer: Self-pay

## 2020-03-23 VITALS — BP 210/120 | HR 111 | Temp 99.0°F | Ht 68.0 in | Wt 209.0 lb

## 2020-03-23 DIAGNOSIS — Z0000 Encounter for general adult medical examination without abnormal findings: Secondary | ICD-10-CM

## 2020-03-23 DIAGNOSIS — D500 Iron deficiency anemia secondary to blood loss (chronic): Secondary | ICD-10-CM

## 2020-03-23 DIAGNOSIS — G4730 Sleep apnea, unspecified: Secondary | ICD-10-CM

## 2020-03-23 DIAGNOSIS — E559 Vitamin D deficiency, unspecified: Secondary | ICD-10-CM

## 2020-03-23 DIAGNOSIS — I10 Essential (primary) hypertension: Secondary | ICD-10-CM

## 2020-03-23 DIAGNOSIS — E538 Deficiency of other specified B group vitamins: Secondary | ICD-10-CM

## 2020-03-23 DIAGNOSIS — E785 Hyperlipidemia, unspecified: Secondary | ICD-10-CM

## 2020-03-23 MED ORDER — METOPROLOL TARTRATE 25 MG PO TABS: 12.5000 mg | ORAL_TABLET | Freq: Two times a day (BID) | ORAL | 3 refills | Status: DC

## 2020-03-23 MED ORDER — ATORVASTATIN CALCIUM 80 MG PO TABS: ORAL_TABLET | ORAL | 3 refills | Status: DC

## 2020-03-23 MED ORDER — FENOFIBRATE 54 MG PO TABS: 54.0000 mg | ORAL_TABLET | Freq: Every day | ORAL | 3 refills | Status: DC

## 2020-03-23 MED ORDER — PANTOPRAZOLE SODIUM 40 MG PO TBEC: DELAYED_RELEASE_TABLET | ORAL | 3 refills | Status: DC

## 2020-03-23 MED ORDER — LISINOPRIL 10 MG PO TABS: 10.0000 mg | ORAL_TABLET | Freq: Every day | ORAL | 3 refills | Status: DC

## 2020-03-23 NOTE — Patient Instructions (Signed)
Please continue all other medications as before, and refills have been done if requested.  Please have the pharmacy call with any other refills you may need.  Please continue your efforts at being more active, low cholesterol diet, and weight control.  You are otherwise up to date with prevention measures today.  Please keep your appointments with your specialists as you may have planned  You will be contacted regarding the referral for: pulmonary  Please go to the LAB at the blood drawing area for the tests to be done  You will be contacted by phone if any changes need to be made immediately.  Otherwise, you will receive a letter about your results with an explanation, but please check with MyChart first.  Please remember to sign up for MyChart if you have not done so, as this will be important to you in the future with finding out test results, communicating by private email, and scheduling acute appointments online when needed.  Please make an Appointment to return for your 1 month to check the blood pressure

## 2020-03-23 NOTE — Assessment & Plan Note (Signed)
For pulm referral and f/u

## 2020-03-23 NOTE — Assessment & Plan Note (Signed)
No overt bleeding, for lab f/u

## 2020-03-23 NOTE — Assessment & Plan Note (Signed)
stable overall by history and exam, recent data reviewed with pt, and pt to continue medical treatment as before,  to f/u any worsening symptoms or concerns, for lab f/u and low chol diet

## 2020-03-23 NOTE — Progress Notes (Signed)
Subjective:    Patient ID: Martin Archer, male    DOB: 1960/10/11, 59 y.o.   MRN: [id number]  HPI  Here for yearly f/u;  Overall doing ok;  Pt denies Chest pain, worsening SOB, DOE, wheezing, orthopnea, PND, worsening LE edema, palpitations, dizziness or syncope.  Pt denies neurological change such as new headache, facial or extremity weakness.  Pt denies polydipsia, polyuria, or low sugar symptoms. Pt states overall good compliance with treatment and medications, good tolerability, and has been trying to follow appropriate diet.  Pt denies worsening depressive symptoms, suicidal ideation or panic. No fever, night sweats, wt loss, loss of appetite, or other constitutional symptoms.  Pt states good ability with ADL's, has low fall risk, home safety reviewed and adequate, no other significant changes in hearing or vision, and only occasionally active with exercise.  Has colonoscopy for next month.  Due for sleep apnea follow up.  Has not taken any medications for 6 wks Wt Readings from Last 3 Encounters:  03/23/20 (!) 209 lb (94.8 kg)  11/06/18 200 lb (90.7 kg)  10/19/17 180 lb (81.6 kg)  Pt not sure if had covid infection, but was ill last fall 2020 with fever and typical symptoms.   Past Medical History:  Diagnosis Date  . Anemia, unspecified   . Arthritis   . Diverticulosis of colon (without mention of hemorrhage)   . Gastric ulcer with hemorrhage and obstruction 2011   Dr [facility]  . Hyperlipemia   . Hypertension   . Personal history of colonic polyps 01/30/2010   hyperplastic rectal  . Sleep apnea    on CPAP; [facility]   Past Surgical History:  Procedure Laterality Date  . CAROTID ENDARTERECTOMY  11/2006   left Dr Mamie Nick  . ENDARTERECTOMY Right 01/19/2015   Procedure: RIGHT CAROTID ENDARTERECTOMY WITH PATCH ANGIOPLASTY;  Surgeon: Mal Misty, MD;  Location: [facility];  Service: Vascular;  Laterality: Right;  . ESOPHAGOGASTRODUODENOSCOPY Left  06/17/2013   Procedure: ESOPHAGOGASTRODUODENOSCOPY (EGD);  Surgeon: Arta Silence, MD;  Location: [facility] ENDOSCOPY;  Service: Endoscopy;  Laterality: Left;  . ESOPHAGOGASTRODUODENOSCOPY N/A 09/20/2015   Procedure: ESOPHAGOGASTRODUODENOSCOPY (EGD);  Surgeon: Ladene Artist, MD;  Location: [facility] ENDOSCOPY;  Service: Endoscopy;  Laterality: N/A;  . [place]  . NASAL FRACTURE SURGERY    . TEE WITHOUT CARDIOVERSION N/A 01/17/2015   Procedure: TRANSESOPHAGEAL ECHOCARDIOGRAM (TEE);  Surgeon: Sanda Klein, MD;  Location: [facility] ENDOSCOPY;  Service: Cardiovascular;  Laterality: N/A;  . WISDOM TOOTH EXTRACTION      reports that he quit smoking about 5 years ago. His smoking use included cigarettes. He smoked 1.00 pack per day. He has never used smokeless tobacco. He reports current alcohol use. He reports that he does not use drugs. family history includes Cancer in his maternal grandfather; Diabetes in his father and paternal grandfather; Heart attack in his mother; Heart attack (age of onset: 66) in his brother; Heart attack (age of onset: 69) in his maternal grandfather; Hypertension in his father; Stroke in his father and paternal uncle. Allergies  Allergen Reactions  . Adderall [Amphetamine-Dextroamphet Er] Diarrhea  . Prozac [Fluoxetine Hcl] Diarrhea  . Sertraline Hcl Diarrhea  . Other Diarrhea    Advil, ibuprofen, tylenol, (excedrin is OK)   Current Outpatient Medications on File Prior to Visit  Medication Sig Dispense Refill  . acetaminophen (TYLENOL) 500 MG tablet Take 2 tablets (1,000 mg total) by mouth every 6 (six) hours as needed. [place]  tablet 0  . Multiple Vitamins-Minerals (MULTIVITAL PO) Take 1 tablet by mouth daily.      No current facility-administered medications on file prior to visit.   Review of Systems All otherwise neg per pt    Objective:   Physical Exam BP (!) 210/120 (BP Location: Left Arm, Patient Position: Sitting, Cuff Size: Large)   Pulse (!) 111   Temp  99 F (37.2 C) (Oral)   Ht 5\' 8"  (1.727 m)   Wt (!) 209 lb (94.8 kg)   SpO2 96%   BMI 31.78 kg/m  VS noted,  Constitutional: Pt appears in NAD HENT: Head: NCAT.  Right Ear: External ear normal.  Left Ear: External ear normal.  Eyes: . Pupils are equal, round, and reactive to light. Conjunctivae and EOM are normal Nose: without d/c or deformity Neck: Neck supple. Gross normal ROM Cardiovascular: Normal rate and regular rhythm.   Pulmonary/Chest: Effort normal and breath sounds without rales or wheezing.  Abd:  Soft, NT, ND, + BS, no organomegaly Neurological: Pt is alert. At baseline orientation, motor grossly intact Skin: Skin is warm. No rashes, other new lesions, no LE edema Psychiatric: Pt behavior is normal without agitation  .All otherwise neg per pt Lab Results  Component Value Date   WBC 11.0 (H) 10/17/2017   HGB 11.3 (L) 10/17/2017   HCT 33.8 (L) 10/17/2017   PLT 296 10/17/2017   GLUCOSE 109 (H) 10/17/2017   CHOL 171 09/26/2017   TRIG 298.0 (H) 09/26/2017   HDL 47.00 09/26/2017   LDLDIRECT 90.0 09/26/2017   LDLCALC 67 09/30/2015   ALT 23 09/26/2017   AST 22 09/26/2017   NA 138 10/17/2017   K 4.2 10/17/2017   CL 101 10/17/2017   CREATININE 2.08 (H) 10/17/2017   BUN 32 (H) 10/17/2017   CO2 28 10/17/2017   TSH 3.05 09/26/2017   PSA 0.81 06/30/2015   INR 1.07 09/18/2015   HGBA1C 5.5 09/19/2015   MICROALBUR 0.8 07/15/2006      Assessment & Plan:

## 2020-03-23 NOTE — Assessment & Plan Note (Addendum)
Severe uncontrolled, o/w stable overall by history and exam, recent data reviewed with pt, and pt to continue medical treatment as before,  to f/u any worsening symptoms or concerns, declines ED referral, to restart meds and f/u 1 mo

## 2020-03-24 LAB — LIPID PANEL
Cholesterol: 220 mg/dL — ABNORMAL HIGH (ref <200)
HDL: 34 mg/dL — ABNORMAL LOW (ref >=40)
Non-HDL Cholesterol (Calc): 186 mg/dL (calc) — ABNORMAL HIGH (ref <130)
Total CHOL/HDL Ratio: 6.5 (calc) — ABNORMAL HIGH (ref <5.0)
Triglycerides: 447 mg/dL — ABNORMAL HIGH (ref <150)

## 2020-03-24 LAB — CBC WITH DIFFERENTIAL/PLATELET
Absolute Monocytes: 1213 cells/uL — ABNORMAL HIGH (ref 200–950)
Basophils Absolute: 75 cells/uL (ref 0–200)
Basophils Relative: 0.6 %
Eosinophils Absolute: 0 cells/uL — ABNORMAL LOW (ref 15–500)
Eosinophils Relative: 0 %
HCT: 45.7 % (ref 38.5–50.0)
Hemoglobin: 15.3 g/dL (ref 13.2–17.1)
Lymphs Abs: 2513 cells/uL (ref 850–3900)
MCH: 29.4 pg (ref 27.0–33.0)
MCHC: 33.5 g/dL (ref 32.0–36.0)
MCV: 87.9 fL (ref 80.0–100.0)
MPV: 9.7 fL (ref 7.5–12.5)
Monocytes Relative: 9.7 %
Neutro Abs: 8700 cells/uL — ABNORMAL HIGH (ref 1500–7800)
Neutrophils Relative %: 69.6 %
Platelets: 313 10*3/uL (ref 140–400)
RBC: 5.2 10*6/uL (ref 4.20–5.80)
RDW: 13.1 % (ref 11.0–15.0)
Total Lymphocyte: 20.1 %
WBC: 12.5 10*3/uL — ABNORMAL HIGH (ref 3.8–10.8)

## 2020-03-24 LAB — COMPLETE METABOLIC PANEL WITH GFR
AG Ratio: 1.6 (calc) (ref 1.0–2.5)
ALT: 21 U/L (ref 9–46)
AST: 23 U/L (ref 10–35)
Albumin: 4.3 g/dL (ref 3.6–5.1)
Alkaline phosphatase (APISO): 115 U/L (ref 35–144)
BUN/Creatinine Ratio: 16 (calc) (ref 6–22)
BUN: 25 mg/dL (ref 7–25)
CO2: 28 mmol/L (ref 20–32)
Calcium: 9.6 mg/dL (ref 8.6–10.3)
Chloride: 102 mmol/L (ref 98–110)
Creat: 1.55 mg/dL — ABNORMAL HIGH (ref 0.70–1.25)
GFR, Est African American: 55 mL/min/{1.73_m2} — ABNORMAL LOW (ref >=60)
GFR, Est Non African American: 48 mL/min/{1.73_m2} — ABNORMAL LOW (ref >=60)
Globulin: 2.7 g/dL (calc) (ref 1.9–3.7)
Glucose, Bld: 82 mg/dL (ref 65–99)
Potassium: 4.6 mmol/L (ref 3.5–5.3)
Sodium: 139 mmol/L (ref 135–146)
Total Bilirubin: 0.4 mg/dL (ref 0.2–1.2)
Total Protein: 7 g/dL (ref 6.1–8.1)

## 2020-03-24 LAB — TRANSFERRIN: Transferrin: 219 mg/dL (ref 188–341)

## 2020-03-24 LAB — PSA: PSA: 1.6 ng/mL (ref <=4.0)

## 2020-03-24 LAB — VITAMIN B12: Vitamin B-12: 533 pg/mL (ref 200–1100)

## 2020-03-24 LAB — FERRITIN: Ferritin: 54 ng/mL (ref 24–380)

## 2020-03-24 LAB — VITAMIN D 25 HYDROXY (VIT D DEFICIENCY, FRACTURES): Vit D, 25-Hydroxy: 29 ng/mL — ABNORMAL LOW (ref 30–100)

## 2020-03-24 LAB — TSH: TSH: 1.73 mIU/L (ref 0.40–4.50)

## 2020-03-31 ENCOUNTER — Other Ambulatory Visit: Payer: Self-pay

## 2020-03-31 ENCOUNTER — Ambulatory Visit (AMBULATORY_SURGERY_CENTER): Payer: Self-pay

## 2020-03-31 VITALS — Ht 68.0 in | Wt 210.0 lb

## 2020-03-31 DIAGNOSIS — D509 Iron deficiency anemia, unspecified: Secondary | ICD-10-CM

## 2020-03-31 NOTE — Progress Notes (Signed)
Patient presented for pre visit today and has requested to cancel procedure at this time as he wants to speak with the MD and discuss colonoscopy vs Cologuard for his care;  Patient has been made an appt with Dr. Fuller Plan; procedure has been cancelled in Epic;

## 2020-04-07 ENCOUNTER — Telehealth: Payer: Self-pay | Admitting: Internal Medicine

## 2020-04-07 NOTE — Telephone Encounter (Signed)
Biagio Borg, MD  03/24/2020 7:38 PM EDT Back to Top    The test results show that your current treatment is OK, as the tests are stable, except the cholesterol is quite high again. Please restart and take the lipitor as we discussed.  There is no other need for change of treatment or further evaluation based on these results, at this time.   Any other information above is not felt to be significant at this time, and can be reviewed at your next visit.   Please otherwise continue the same plan for further evaluation and treatment as discussed.   Message left for patient with Dr. Gwynn Burly results.

## 2020-04-07 NOTE — Telephone Encounter (Signed)
New message:   Pt would like a call to discuss his lab results. Please advise.

## 2020-04-12 ENCOUNTER — Encounter: Payer: Self-pay | Admitting: Internal Medicine

## 2020-04-14 ENCOUNTER — Encounter: Payer: Medicaid Other | Admitting: Gastroenterology

## 2020-04-22 ENCOUNTER — Ambulatory Visit: Payer: Medicare Other | Admitting: Internal Medicine

## 2020-04-26 ENCOUNTER — Ambulatory Visit (INDEPENDENT_AMBULATORY_CARE_PROVIDER_SITE_OTHER): Payer: Medicare Other | Admitting: Internal Medicine

## 2020-04-26 ENCOUNTER — Encounter: Payer: Self-pay | Admitting: Internal Medicine

## 2020-04-26 ENCOUNTER — Other Ambulatory Visit: Payer: Self-pay

## 2020-04-26 VITALS — BP 112/82 | HR 91 | Temp 98.7°F | Ht 68.0 in | Wt 210.0 lb

## 2020-04-26 DIAGNOSIS — I10 Essential (primary) hypertension: Secondary | ICD-10-CM

## 2020-04-26 DIAGNOSIS — M549 Dorsalgia, unspecified: Secondary | ICD-10-CM | POA: Diagnosis not present

## 2020-04-26 DIAGNOSIS — G8929 Other chronic pain: Secondary | ICD-10-CM

## 2020-04-26 DIAGNOSIS — G4730 Sleep apnea, unspecified: Secondary | ICD-10-CM

## 2020-04-26 NOTE — Progress Notes (Signed)
Subjective:    Patient ID: Martin Archer, male    DOB: 1960/12/19, 59 y.o.   MRN: [id number]  HPI  Here to f/u, with pt a difficult historian, pressured speech, and amazed that I have not been able to follow in minute detail his recent course of care over the past 18 months though I have not been directly involved.  Appears exasperated he has to give the history regarding this.  Pt states he is now disabled for lower back pain since early 2020; had flare starting Apr 06 2020 but asks for aug 15 date to start, as he had to stop working that day, is working on rest, excercises, sleeping in fetal position helps, taking tylenol only, pain level sometimes up to 10, worse to stand up and move about, unable to leave the house and has to stand and walk at work (very little sitting); had bilateral sciatica, with numbness as well to the feet, and some mild weakness, no recent falls.  Works for a [employer] whose HR recommended a leave of abscense, so as not to lose his part time job. Getting $2K per mo SSI.  Currently still not able to work due to the pain that has not improved.  Does not qualify for FMLA use as he is only part time.   Past Medical History:  Diagnosis Date  . Anemia, unspecified   . Arthritis   . Diverticulosis of colon (without mention of hemorrhage)   . Gastric ulcer with hemorrhage and obstruction 2011   Dr [facility]  . GERD (gastroesophageal reflux disease)    hx  . Hyperlipemia   . Hypertension   . Personal history of colonic polyps 01/30/2010   hyperplastic rectal  . Sleep apnea    on CPAP; [facility]  . Stroke [facility])    mini strokes-bilateral carotid endarterectomy   Past Surgical History:  Procedure Laterality Date  . CAROTID ENDARTERECTOMY  11/2006   left Dr Mamie Nick  . COLONOSCOPY  2011   DP-positive FOBT/IDA  . ENDARTERECTOMY Right 01/19/2015   Procedure: RIGHT CAROTID ENDARTERECTOMY WITH PATCH ANGIOPLASTY;  Surgeon: Mal Misty, MD;   Location: [facility];  Service: Vascular;  Laterality: Right;  . ESOPHAGOGASTRODUODENOSCOPY Left 06/17/2013   Procedure: ESOPHAGOGASTRODUODENOSCOPY (EGD);  Surgeon: Arta Silence, MD;  Location[facility] ENDOSCOPY;  Service: Endoscopy;  Laterality: Left;  . ESOPHAGOGASTRODUODENOSCOPY N/A 09/20/2015   Procedure: ESOPHAGOGASTRODUODENOSCOPY (EGD);  Surgeon: Ladene Artist, MD;  Location: [facility] ENDOSCOPY;  Service: Endoscopy;  Laterality: N/A;  . FOOT SURGERY Bilateral 2020   Taylor's foot  . [place]  . NASAL FRACTURE SURGERY  1974  . TEE WITHOUT CARDIOVERSION N/A 01/17/2015   Procedure: TRANSESOPHAGEAL ECHOCARDIOGRAM (TEE);  Surgeon: Sanda Klein, MD;  Location: [facility] ENDOSCOPY;  Service: Cardiovascular;  Laterality: N/A;  . WISDOM TOOTH EXTRACTION      reports that he has been smoking cigarettes. He has been smoking about 0.50 packs per day. He has never used smokeless tobacco. He reports previous alcohol use. He reports that he does not use drugs. family history includes Cancer in his maternal grandfather; Diabetes in his father and paternal grandfather; Heart attack in his mother; Heart attack (age of onset: 72) in his brother; Heart attack (age of onset: 4) in his maternal grandfather; Hypertension in his father; Stroke in his father and paternal uncle. Allergies  Allergen Reactions  . Adderall [Amphetamine-Dextroamphet Er] Diarrhea  . Prozac [Fluoxetine Hcl] Diarrhea  . Sertraline Hcl  Diarrhea  . Other Diarrhea    Advil, ibuprofen, tylenol, (excedrin is OK)   Current Outpatient Medications on File Prior to Visit  Medication Sig Dispense Refill  . atorvastatin (LIPITOR) 80 MG tablet TAKE 1 TABLET BY MOUTH DAILY 90 tablet 3  . lisinopril (ZESTRIL) 10 MG tablet Take 1 tablet (10 mg total) by mouth daily. 90 tablet 3  . metoprolol tartrate (LOPRESSOR) 25 MG tablet Take 0.5 tablets (12.5 mg total) by mouth 2 (two) times daily. 180 tablet 3   No current facility-administered  medications on file prior to visit.   Review of Systems All otherwise neg per pt =    Objective:   Physical Exam BP 112/82 (BP Location: Left Arm, Patient Position: Sitting, Cuff Size: Large)   Pulse 91   Temp 98.7 F (37.1 C) (Oral)   Ht 5\' 8"  (1.727 m)   Wt 210 lb (95.3 kg)   SpO2 96%   BMI 31.93 kg/m  VS noted,  Constitutional: Pt appears in NAD HENT: Head: NCAT.  Right Ear: External ear normal.  Left Ear: External ear normal.  Eyes: . Pupils are equal, round, and reactive to light. Conjunctivae and EOM are normal Nose: without d/c or deformity Neck: Neck supple. Gross normal ROM Cardiovascular: Normal rate and regular rhythm.   Pulmonary/Chest: Effort normal and breath sounds without rales or wheezing.  Abd:  Soft, NT, ND, + BS, no organomegaly Neurological: Pt is alert. At baseline orientation, motor grossly intact Skin: Skin is warm. No rashes, other new lesions, no LE edema Psychiatric: Pt behavior is normal without agitation  All otherwise neg per pt Lab Results  Component Value Date   WBC 12.5 (H) 03/23/2020   HGB 15.3 03/23/2020   HCT 45.7 03/23/2020   PLT 313 03/23/2020   GLUCOSE 82 03/23/2020   CHOL 220 (H) 03/23/2020   TRIG 447 (H) 03/23/2020   HDL 34 (L) 03/23/2020   LDLDIRECT 90.0 09/26/2017   [place]  03/23/2020     Comment:     . LDL cholesterol not calculated. Triglyceride levels greater than 400 mg/dL invalidate calculated LDL results. . Reference range: <100 . Desirable range <100 mg/dL for primary prevention;   <70 mg/dL for patients with CHD or diabetic patients  with > or = 2 CHD risk factors. Marland Kitchen LDL-C is now calculated using the Martin-Hopkins  calculation, which is a validated novel method providing  better accuracy than the Friedewald equation in the  estimation of LDL-C.  Cresenciano Genre et al. Annamaria Helling. 6222;979(89): 2061-2068  (http://education.[facility].com/faq/FAQ164)    ALT 21 03/23/2020   AST 23 03/23/2020   NA 139  03/23/2020   K 4.6 03/23/2020   CL 102 03/23/2020   CREATININE 1.55 (H) 03/23/2020   BUN 25 03/23/2020   CO2 28 03/23/2020   TSH 1.73 03/23/2020   PSA 1.6 03/23/2020   INR 1.07 09/18/2015   HGBA1C 5.5 09/19/2015   MICROALBUR 0.8 07/15/2006      Assessment & Plan:

## 2020-04-26 NOTE — Patient Instructions (Signed)
We will try to get the last MRI and office visit note from [facility] (the old [facility] [employer])  We will try to have the Leave form done by Friday  You will be contacted regarding the referral for: pulmonary and sports medicine (asap)  Please continue all other medications as before, and refills have been done if requested.  Please have the pharmacy call with any other refills you may need.  Please continue your efforts at being more active, low cholesterol diet, and weight control  Please keep your appointments with your specialists as you may have planned

## 2020-04-29 DIAGNOSIS — Z0279 Encounter for issue of other medical certificate: Secondary | ICD-10-CM

## 2020-04-29 NOTE — Telephone Encounter (Signed)
I have recived these forms.  Forms have been completed for a LOA starting 8/15 to 10/3. Placed in providers box to review and sign.

## 2020-04-29 NOTE — Telephone Encounter (Signed)
Form has been signed, Faxed to [place], Copy sent to scan &Changed for.   LVM to inform patient and original is ready to be picked up.

## 2020-05-01 ENCOUNTER — Encounter: Payer: Self-pay | Admitting: Internal Medicine

## 2020-05-01 NOTE — Assessment & Plan Note (Signed)
stable overall by history and exam, recent data reviewed with pt, and pt to continue medical treatment as before,  to f/u any worsening symptoms or concerns  

## 2020-05-01 NOTE — Assessment & Plan Note (Signed)
Due for F/u  - refer pulmonary

## 2020-05-01 NOTE — Assessment & Plan Note (Addendum)
Ok for leave of abscence agu 15 to return to work (part time) Oct 3 to give time to f/u sport med as planned; will try to get last MRI per [facility] results, declines tramadol prn  I spent 41 minutes in preparing to see the patient by review of recent labs, imaging and procedures, obtaining and reviewing separately obtained history, communicating with the patient and family or caregiver, ordering medications, tests or procedures, and documenting clinical information in the EHR including the differential Dx, treatment, and any further evaluation and other management of chronic back pain, htn, osa

## 2020-05-03 ENCOUNTER — Encounter: Payer: Self-pay | Admitting: Family Medicine

## 2020-05-03 ENCOUNTER — Other Ambulatory Visit: Payer: Self-pay

## 2020-05-03 ENCOUNTER — Ambulatory Visit (INDEPENDENT_AMBULATORY_CARE_PROVIDER_SITE_OTHER): Payer: Medicare Other | Admitting: Family Medicine

## 2020-05-03 ENCOUNTER — Ambulatory Visit (INDEPENDENT_AMBULATORY_CARE_PROVIDER_SITE_OTHER): Payer: Medicare Other

## 2020-05-03 VITALS — BP 160/90 | HR 82 | Ht 68.0 in | Wt 208.6 lb

## 2020-05-03 DIAGNOSIS — M5442 Lumbago with sciatica, left side: Secondary | ICD-10-CM

## 2020-05-03 DIAGNOSIS — G8929 Other chronic pain: Secondary | ICD-10-CM

## 2020-05-03 DIAGNOSIS — R202 Paresthesia of skin: Secondary | ICD-10-CM

## 2020-05-03 DIAGNOSIS — M5441 Lumbago with sciatica, right side: Secondary | ICD-10-CM

## 2020-05-03 DIAGNOSIS — M4317 Spondylolisthesis, lumbosacral region: Secondary | ICD-10-CM

## 2020-05-03 NOTE — Progress Notes (Signed)
X-ray lumbar spine shows pretty significant shift of the vertebrae almost 3.3 cm forward.  This is a significant amount of motion in my opinion.  Physical therapy is a good idea however I think were probably going to be moving towards MRI pretty soon.  If not better rapidly with physical therapy may consider surgery.

## 2020-05-03 NOTE — Patient Instructions (Signed)
Thank you for coming in today. Get xray on the way out.  Start PT. They will call you or you can call them.  If you have problems with physical therapy let me know.   Recheck with me in about 1 month.

## 2020-05-03 NOTE — Progress Notes (Signed)
Subjective:    I'm seeing this patient as a consultation for:  Dr. Jenny Reichmann. Note will be routed back to referring provider/PCP.  CC: Chronic low back pain  I, Martin Archer, LAT, ATC, am serving as scribe for Dr. Lynne Leader.  HPI: Pt is a 59 y/o male presenting w/ chronic low back pain who has been on disability since early 2020.Martin Archer  He reports B LE sciatica symptoms including numbness/tingling in his B LEs.  He has bothersome paresthesias as well.  He notes this problem has been ongoing for years.  He has diagnosed grade 2 spondylolisthesis on MRI from 2012.  Has not had surgery.  He had trials of physical therapy and injections.  Physical therapy most recent trial was over 10 years ago and injections were in 2015.  He thinks they helped but somewhat temporarily.  He notes he no longer is doing home exercise program.  Despite being on permanent disability he is working part-time and took a leave of absence until October of this year.  47 of care recently was at [facility].  Radiating pain: yes in B LEs to his ankles LE numbness/tingling: yes in his B feet Aggravating factors: standing and walking; prolonged sitting Treatments tried: prior epidurals; hydrocodone;   Diagnostic imaging: Lumbar spine XR- 2019  Past medical history, Surgical history, Family history, Social history, Allergies, and medications have been entered into the medical record, reviewed.   Review of Systems: No new headache, visual changes, nausea, vomiting, diarrhea, constipation, dizziness, abdominal pain, skin rash, fevers, chills, night sweats, weight loss, swollen lymph nodes, body aches, joint swelling, muscle aches, chest pain, shortness of breath, mood changes, visual or auditory hallucinations.   Objective:    Vitals:   05/03/20 0757  BP: (!) 160/90  Pulse: 82  SpO2: 97%   General: Well Developed, well nourished, and in no acute distress.  Neuro/Psych: Alert and oriented x3, extra-ocular muscles  intact, able to move all 4 extremities, sensation grossly intact. Skin: Warm and dry, no rashes noted.  Respiratory: Not using accessory muscles, speaking in full sentences, trachea midline.  Cardiovascular: Pulses palpable, no extremity edema. Abdomen: Does not appear distended. MSK: L-spine: Normal-appearing Nontender midline. Decreased lumbar range of motion including especially to extension. Lower extremity strength is intact. Reflexes are intact. Normal gait.  Lab and Radiology Results X-ray lumbar spine including flexion and extension views obtained today personally and independently reviewed Significant spondylolisthesis at L5-S1 with near complete grade 4 spondylolisthesis with extension.  Improving to approximately grade 3 with flexion. Await formal radiology review   MRI Lspine 12/14/2010 IMPRESSION:  Grade II spondylolisthesis at L5 due to bilateral pars defects.  Associated advanced disc disease at L5-S1 with moderate foraminal  stenosis bilaterally.   Impression and Recommendations:    Assessment and Plan: 59 y.o. male with chronic low back pain with lumbar radiculopathy and paresthesias.  Patient has had this condition for quite a while. This is his first visit with me.  There is a lot of information that I still do not know and I am seeking to update his imaging of his lumbar spine with x-ray today.  Additionally will obtain records from [facility] to understand better what has happened in the past.  Plan for x-ray today and trial of physical therapy.  Recheck in 1 month.  Likely will preprogressing to MRI and possibly nerve conduction study even.  We will fill out forms if needed.   Orders Placed This Encounter  Procedures  .  DG Lumbar Spine Complete W/Bend    Standing Status:   Future    Number of Occurrences:   1    Standing Expiration Date:   05/03/2021    Order Specific Question:   Reason for Exam (SYMPTOM  OR DIAGNOSIS REQUIRED)    Answer:   eval  chronic back pain and leg pain    Order Specific Question:   Preferred imaging location?    Answer:   [facility]    Order Specific Question:   Radiology Contrast Protocol - do NOT remove file path    Answer:   [email or web address]  . Ambulatory referral to Physical Therapy    Referral Priority:   Routine    Referral Type:   Physical Medicine    Referral Reason:   Specialty Services Required    Requested Specialty:   Physical Therapy   No orders of the defined types were placed in this encounter.   Discussed warning signs or symptoms. Please see discharge instructions. Patient expresses understanding.   The above documentation has been reviewed and is accurate and complete Lynne Leader, M.D.

## 2020-05-10 ENCOUNTER — Encounter: Payer: Medicare Other | Admitting: Gastroenterology

## 2020-05-12 ENCOUNTER — Other Ambulatory Visit: Payer: Self-pay

## 2020-05-12 ENCOUNTER — Ambulatory Visit: Payer: Medicare Other | Attending: Family Medicine

## 2020-05-12 DIAGNOSIS — M6281 Muscle weakness (generalized): Secondary | ICD-10-CM | POA: Diagnosis present

## 2020-05-12 DIAGNOSIS — M5441 Lumbago with sciatica, right side: Secondary | ICD-10-CM | POA: Insufficient documentation

## 2020-05-12 DIAGNOSIS — M5442 Lumbago with sciatica, left side: Secondary | ICD-10-CM | POA: Diagnosis present

## 2020-05-12 DIAGNOSIS — M4316 Spondylolisthesis, lumbar region: Secondary | ICD-10-CM | POA: Diagnosis present

## 2020-05-12 DIAGNOSIS — G8929 Other chronic pain: Secondary | ICD-10-CM | POA: Insufficient documentation

## 2020-05-12 DIAGNOSIS — R293 Abnormal posture: Secondary | ICD-10-CM | POA: Diagnosis present

## 2020-05-12 NOTE — Therapy (Signed)
[facility] [address] [place], [place], [address] Phone: [phone]   Fax:  [phone]  Physical Therapy Evaluation  Patient Details  Name: Martin Archer MRN: [id number] Date of Birth: 1961/05/20 Referring Provider (PT): Clementeen Graham, MD   Encounter Date: 05/12/2020   PT End of Session - 05/12/20 1623    Visit Number 1    Number of Visits 12    Date for PT Re-Evaluation 06/23/20    Authorization Type Medicare    Progress Note Due on Visit 10    PT Start Time 0940    PT Stop Time 1021    PT Time Calculation (min) 41 min    Activity Tolerance Patient tolerated treatment well    Behavior During Therapy [facility] for tasks assessed/performed           Past Medical History:  Diagnosis Date  . Anemia, unspecified   . Arthritis   . Diverticulosis of colon (without mention of hemorrhage)   . Gastric ulcer with hemorrhage and obstruction 2011   Dr [facility]  . GERD (gastroesophageal reflux disease)    hx  . Hyperlipemia   . Hypertension   . Personal history of colonic polyps 01/30/2010   hyperplastic rectal  . Sleep apnea    on CPAP; [facility]  . Stroke [facility])    mini strokes-bilateral carotid endarterectomy    Past Surgical History:  Procedure Laterality Date  . CAROTID ENDARTERECTOMY  11/2006   left Dr Jerilee Field  . COLONOSCOPY  2011   DP-positive FOBT/IDA  . ENDARTERECTOMY Right 01/19/2015   Procedure: RIGHT CAROTID ENDARTERECTOMY WITH PATCH ANGIOPLASTY;  Surgeon: Pryor Ochoa, MD;  Location: [facility] OR;  Service: Vascular;  Laterality: Right;  . ESOPHAGOGASTRODUODENOSCOPY Left 06/17/2013   Procedure: ESOPHAGOGASTRODUODENOSCOPY (EGD);  Surgeon: Willis Modena, MD;  Location: [facility] ENDOSCOPY;  Service: Endoscopy;  Laterality: Left;  . ESOPHAGOGASTRODUODENOSCOPY N/A 09/20/2015   Procedure: ESOPHAGOGASTRODUODENOSCOPY (EGD);  Surgeon: Meryl Dare, MD;  Location: [facility] ENDOSCOPY;  Service: Endoscopy;   Laterality: N/A;  . FOOT SURGERY Bilateral 2020   Taylor's foot  . INGUINAL HERNIA REPAIR  1963  . NASAL FRACTURE SURGERY  1974  . TEE WITHOUT CARDIOVERSION N/A 01/17/2015   Procedure: TRANSESOPHAGEAL ECHOCARDIOGRAM (TEE);  Surgeon: Thurmon Fair, MD;  Location: [facility] ENDOSCOPY;  Service: Cardiovascular;  Laterality: N/A;  . WISDOM TOOTH EXTRACTION      There were no vitals filed for this visit.    Subjective Assessment - 05/12/20 0942    Subjective Pt reports this has been an ongoing issues. His sciatica hasn't acted up as much as it used to. 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 He was treated at [facility] in the past. He hasn't done that job in [place], which has helped. He has been recommended to have surgery, but "it isn't guaranteed to help, so he is at PT". He has had epidurals which has been helpful. Due to the pandemic, he has gained almost 20# from not being as active and he is sure if he loses some weight it would help. He would like to exercise more, but he is having numbness in his feet. With the added weight, he is having difficulty.    Limitations Standing;Walking;House hold activities;Sitting;Lifting    How long can you walk comfortably? a block or 2    Diagnostic tests XR Lumbar spine: "significant shift of the vertebrae  almost 3.3 cm forward"    Patient Stated Goals To increase stamina and activity level again    Currently in Pain? Yes    Pain Score 4    has been a 12   Pain Location Back    Pain Orientation Lower    Pain Descriptors / Indicators Discomfort    Pain Type Neuropathic pain;Chronic pain    Pain Radiating Towards numbness in legs and feet    Pain Onset More than a month ago    Pain Frequency Constant    Aggravating Factors  Prolonged positions    Pain Relieving Factors stretching, Tylenol, changing position              [facility] PT Assessment - 05/12/20 0001       Assessment   Referring Provider (PT) Clementeen Graham, MD    Onset Date/Surgical Date --   chronic   Hand Dominance Right    Next MD Visit 06/02/20    Prior Therapy Yes, at [facility]   Has the patient fallen in the past 6 months No   has had dizziness   Has the patient had a decrease in activity level because of a fear of falling?  Yes    Is the patient reluctant to leave their home because of a fear of falling?  No      Prior Function   Vocation Other (comment)    Vocation Requirements semi-retirement, but leave of absence due to condition   supposed to go back to work in October   Leisure Walking. access to gym he is not using much atm      ROM / Strength   AROM / PROM / Strength AROM;Strength      AROM   AROM Assessment Site Lumbar    Lumbar Flexion 50   pain   Lumbar Extension 10   pain   Lumbar - Right Side Bend fingertips just above knee   pain   Lumbar - Left Side Bend fingertips to knee   pain   Lumbar - Right Rotation 50% limited   pain   Lumbar - Left Rotation 50% limited   pain                     Objective measurements completed on examination: See above findings.               PT Education - 05/12/20 1622    Education Details Diagonsis, Prognosis, HEP, POC    Person(s) Educated Patient    Methods Handout;Verbal cues;Tactile cues;Demonstration;Explanation    Comprehension Verbalized understanding;Returned demonstration;Verbal cues required;Tactile cues required            PT Short Term Goals - 05/12/20 1640      PT SHORT TERM GOAL #1   Title Pt will be I and compliant with initial HEP.    Time 2    Period Weeks    Status New    Target Date 05/26/20             PT Long Term Goals - 05/12/20 1643      PT LONG TERM GOAL #1   Title Pt will be I and compliant with long term HEP for maintenance and progression.    Time 6    Period Weeks    Status New    Target Date 06/23/20      PT LONG TERM GOAL  #2   Title  Pt will demo lumbar ROM WFL and pain free for ADLs and IADLs.    Time 6    Period Weeks    Status New    Target Date 06/23/20      PT LONG TERM GOAL #3   Title Pt will demo double leg lower to 45 without pain for improved lumbar support and core stabilization.    Time 6    Period Weeks    Status New    Target Date 06/23/20      PT LONG TERM GOAL #4   Title Pt will increase abilty to walk for 30 minutes, in order for exercise and weight loss.    Time 6    Period Weeks    Status New    Target Date 06/23/20      PT LONG TERM GOAL #5   Title Pt will be able to stand for 10 minutes to perform errands and household activities.    Time 6    Period Weeks    Status New    Target Date 06/23/20                  Plan - 05/12/20 0954    Clinical Impression Statement Pt is a 59 yo male who presents with 3.3 cm step off spondylolisthesis in lumbar spine and chronic LBP with sciatica. Pt demo anterior pelvic tilt, increased weight gain through belly, decreased lumbar AROM greatest into R LF and extension and painful through all AROM, and core weakness. He was edu on HEP, POC, diagnosis, and prognosis verbalizing understanding and consent to tx. He would benefit from 1-2x/week for 6 weeks to address impairments and improve QOL.    Personal Factors and Comorbidities Comorbidity 1;Comorbidity 2;Comorbidity 3+;Past/Current Experience;Time since onset of injury/illness/exacerbation    Comorbidities Arthritis, Stroke, Anemia, HLD, HTN, OSA    Examination-Activity Limitations Locomotion Level;Sit;Stand;Squat;Lift;Bend    Examination-Participation Restrictions Occupation;Shop;Community Activity    Stability/Clinical Decision Making Evolving/Moderate complexity    Clinical Decision Making Moderate    Rehab Potential Good    PT Frequency 2x / week    PT Duration 6 weeks    PT Treatment/Interventions ADLs/Self Care Home Management;Moist Heat;Iontophoresis 4mg /ml  Dexamethasone;Traction;Electrical Stimulation;Cryotherapy;Neuromuscular re-education;Balance training;Therapeutic exercise;Therapeutic activities;Functional mobility training;Spinal Manipulations;Joint Manipulations;Dry needling;Passive range of motion;Patient/family education;Manual techniques    PT Next Visit Plan Back FOTO, assess response to HEP, add further core stab    PT Home Exercise Plan see pt instructions    Consulted and Agree with Plan of Care Patient           Patient will benefit from skilled therapeutic intervention in order to improve the following deficits and impairments:  Pain, Improper body mechanics, Impaired flexibility, Hypomobility, Postural dysfunction, Decreased mobility, Decreased strength, Decreased activity tolerance, Impaired perceived functional ability, Decreased range of motion, Increased fascial restricitons  Visit Diagnosis: Spondylolisthesis of lumbar region - Plan: PT plan of care cert/re-cert  Chronic bilateral low back pain with bilateral sciatica - Plan: PT plan of care cert/re-cert  Abnormal posture - Plan: PT plan of care cert/re-cert  Muscle weakness (generalized) - Plan: PT plan of care cert/re-cert     Problem List Patient Active Problem List   Diagnosis Date Noted  . Spondylolisthesis of lumbosacral region 05/03/2020  . CKD (chronic kidney disease) stage 3, GFR 30-59 ml/min 09/26/2017  . Syncope 09/26/2017  . Elevated transaminase level 07/25/2017  . Cannabis abuse 07/23/2017  . Uncomplicated alcohol dependence ([place]) 07/23/2017  . Melena   . Gastric ulcer  with hemorrhage   . Right hemiparesis ([place]) 09/18/2015  . Chronic blood loss anemia 09/18/2015  . Hematemesis 09/18/2015  . Wellness examination 06/29/2015  . History of CEA (carotid endarterectomy) 03/25/2015  . HLD (hyperlipidemia) 03/25/2015  . Tobacco use disorder 03/25/2015  . Carotid artery occlusion with infarction ([place]) 03/25/2015  . Carotid aneurysm, right ([place])  01/19/2015  . Stroke ([place])   . Aortic arch atherosclerosis ([place])   . Carotid stenosis 01/16/2015  . Thrombus: BILATERAL EXTERNAL JUGULAR VEINS per CT angio head and neck 01/16/15 01/16/2015  . DVT (deep venous thrombosis) ([place])   . Numbness and tingling of left arm and leg 01/15/2015  . CVA (cerebral infarction)   . Paresthesia 12/08/2014  . 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 ADD (attention deficit disorder) 07/14/2013  . Nicotine dependence 06/18/2013  . Back pain, chronic 06/17/2013  . ACUTE GASTRIC ULCER W/HEMORRHAGE AND OBSTRUCTION 02/28/2010  . PERSONAL HX COLONIC POLYPS 02/28/2010  . DIVERTICULOSIS, COLON 02/02/2010  . ANEMIA, SEVERE 01/13/2010  . Essential hypertension 01/13/2010  . Sleep apnea 01/13/2010  . Left carotid bruit 01/13/2010  . MIXED HEARING LOSS BILATERAL 02/16/2009    Izell [place], PT, DPT 05/12/2020, 4:54 PM  [facility] [address] [place], [place], [address] Phone: [phone]   Fax:  [phone]  Name: Lional Icenogle MRN: [id number] Date of Birth: [birth year]

## 2020-05-24 ENCOUNTER — Encounter: Payer: Self-pay | Admitting: Gastroenterology

## 2020-05-24 ENCOUNTER — Ambulatory Visit (INDEPENDENT_AMBULATORY_CARE_PROVIDER_SITE_OTHER): Payer: Medicare Other | Admitting: Gastroenterology

## 2020-05-24 VITALS — BP 172/130 | HR 94 | Ht 68.0 in | Wt 210.0 lb

## 2020-05-24 DIAGNOSIS — Z1212 Encounter for screening for malignant neoplasm of rectum: Secondary | ICD-10-CM

## 2020-05-24 DIAGNOSIS — Z1211 Encounter for screening for malignant neoplasm of colon: Secondary | ICD-10-CM | POA: Diagnosis not present

## 2020-05-24 NOTE — Patient Instructions (Signed)
If you are age 61 or older, your body mass index should be between 23-30. Your Body mass index is 31.93 kg/m. If this is out of the aforementioned range listed, please consider follow up with your Primary Care Provider.  If you are age 44 or younger, your body mass index should be between 19-25. Your Body mass index is 31.93 kg/m. If this is out of the aformentioned range listed, please consider follow up with your Primary Care Provider.   Please give [place] a call when you decide if you want to get cologuard or a colonoscopy.  Thank you for choosing me and [facility].  Pricilla Riffle. Dagoberto Ligas., MD., Marval Regal

## 2020-05-24 NOTE — Progress Notes (Signed)
History of Present Illness: This is a 59 year old male referred by Biagio Borg, MD for the evaluation of colorectal cancer screening.  He has a history of a bleeding gastric ulcer.  Endoscopy in April 2017 confirmed healing.  He has no gastrointestinal complaints.  He underwent colonoscopy by Dr. Sharlett Iles in June 2021 with mild left colon diverticulosis and a small hyperplastic colon polyp removed.  He is interested in discussing colonoscopy versus Cologuard for colon cancer screening. Denies weight loss, abdominal pain, constipation, diarrhea, change in stool caliber, melena, hematochezia, nausea, vomiting, dysphagia, reflux symptoms, chest pain.    Allergies  Allergen Reactions  . Adderall [Amphetamine-Dextroamphet Er] Diarrhea  . Prozac [Fluoxetine Hcl] Diarrhea  . Sertraline Hcl Diarrhea  . Other Diarrhea    Advil, ibuprofen, tylenol, (excedrin is OK)   Outpatient Medications Prior to Visit  Medication Sig Dispense Refill  . acetaminophen (TYLENOL) 325 MG tablet Take 650 mg by mouth every 6 (six) hours as needed.    Marland Kitchen atorvastatin (LIPITOR) 80 MG tablet TAKE 1 TABLET BY MOUTH DAILY 90 tablet 3  . lisinopril (ZESTRIL) 10 MG tablet Take 1 tablet (10 mg total) by mouth daily. 90 tablet 3  . metoprolol tartrate (LOPRESSOR) 25 MG tablet Take 0.5 tablets (12.5 mg total) by mouth 2 (two) times daily. 180 tablet 3   No facility-administered medications prior to visit.   Past Medical History:  Diagnosis Date  . Anemia, unspecified   . Arthritis   . Diverticulosis of colon (without mention of hemorrhage)   . Gastric ulcer with hemorrhage and obstruction 2011   Dr Fidela [facility]  . GERD (gastroesophageal reflux disease)    hx  . Hyperlipemia   . Hypertension   . Personal history of colonic polyps 01/30/2010   hyperplastic rectal  . Sleep apnea    on CPAP; [facility]  . Stroke [facility])    mini strokes-bilateral carotid endarterectomy   Past Surgical History:    Procedure Laterality Date  . CAROTID ENDARTERECTOMY  11/2006   left Dr Mamie Nick  . COLONOSCOPY  2011   DP-positive FOBT/IDA  . ENDARTERECTOMY Right 01/19/2015   Procedure: RIGHT CAROTID ENDARTERECTOMY WITH PATCH ANGIOPLASTY;  Surgeon: Mal Misty, MD;  Location: [facility];  Service: Vascular;  Laterality: Right;  . ESOPHAGOGASTRODUODENOSCOPY Left 06/17/2013   Procedure: ESOPHAGOGASTRODUODENOSCOPY (EGD);  Surgeon: Arta Silence, MD;  Location: [facility] ENDOSCOPY;  Service: Endoscopy;  Laterality: Left;  . ESOPHAGOGASTRODUODENOSCOPY N/A 09/20/2015   Procedure: ESOPHAGOGASTRODUODENOSCOPY (EGD);  Surgeon: Ladene Artist, MD;  Location: [facility] ENDOSCOPY;  Service: Endoscopy;  Laterality: N/A;  . FOOT SURGERY Bilateral 2020   Taylor's foot  . [place]  . NASAL FRACTURE SURGERY  1974  . TEE WITHOUT CARDIOVERSION N/A 01/17/2015   Procedure: TRANSESOPHAGEAL ECHOCARDIOGRAM (TEE);  Surgeon: Sanda Klein, MD;  Location: [facility] ENDOSCOPY;  Service: Cardiovascular;  Laterality: N/A;  . WISDOM TOOTH EXTRACTION     Social History   Socioeconomic History  . Marital status: Legally Separated    Spouse name: Not on file  . Number of children: Not on file  . Years of education: Not on file  . Highest education level: Not on file  Occupational History  . Occupation: self Administrator, Civil Service: [employer]   Tobacco Use  . Smoking status: Current Some Day Smoker    Packs/day: 0.50    Types: Cigarettes    Last attempt to quit: 12/26/2014  Years since quitting: 5.4  . Smokeless tobacco: Never Used  . Tobacco comment: smoked 1976-2007 & 2009- present , up to 2 ppd, average > 1ppd  Vaping Use  . Vaping Use: Never used  Substance and Sexual Activity  . Alcohol use: Not Currently    Alcohol/week: 0.0 standard drinks  . Drug use: No  . Sexual activity: Not on file  Other Topics Concern  . Not on file  Social History Narrative  . Not on file   Social Determinants of Health    Financial Resource Strain:   . Difficulty of Paying Living Expenses: Not on file  Food Insecurity:   . Worried About Charity fundraiser in the Last Year: Not on file  . Ran Out of Food in the Last Year: Not on file  Transportation Needs:   . Lack of Transportation (Medical): Not on file  . Lack of Transportation (Non-Medical): Not on file  Physical Activity:   . Days of Exercise per Week: Not on file  . Minutes of Exercise per Session: Not on file  Stress:   . Feeling of Stress : Not on file  Social Connections:   . Frequency of Communication with Friends and Family: Not on file  . Frequency of Social Gatherings with Friends and Family: Not on file  . Attends Religious Services: Not on file  . Active Member of Clubs or Organizations: Not on file  . Attends Archivist Meetings: Not on file  . Marital Status: Not on file   Family History  Problem Relation Age of Onset  . Diabetes Father   . Hypertension Father   . Stroke Father        in late 7s  . Heart attack Mother        in 43s  . Heart attack Brother 39  . Diabetes Paternal Grandfather   . Cancer Maternal Grandfather        unknown type  . Heart attack Maternal Grandfather 76  . Stroke Paternal Uncle        in late 50s  . Colon cancer Neg Hx   . Esophageal cancer Neg Hx   . Stomach cancer Neg Hx   . Colon polyps Neg Hx   . Rectal cancer Neg Hx       Review of Systems: Pertinent positive and negative review of systems were noted in the above HPI section. All other review of systems were otherwise negative.   Physical Exam: General: Well developed, well nourished, no acute distress Head: Normocephalic and atraumatic Eyes:  sclerae anicteric, EOMI Ears: Normal auditory acuity Mouth: Not examined, mask on during Covid-19 pandemic Neck: Supple, no masses or thyromegaly Lungs: Clear throughout to auscultation Heart: Regular rate and rhythm; no murmurs, rubs or bruits Abdomen: Soft, non tender and  non distended. No masses, hepatosplenomegaly or hernias noted. Normal Bowel sounds Rectal: Not done Musculoskeletal: Symmetrical with no gross deformities  Skin: No lesions on visible extremities Pulses:  Normal pulses noted Extremities: No clubbing, cyanosis, edema or deformities noted Neurological: Alert oriented x 4, grossly nonfocal Cervical Nodes:  No significant cervical adenopathy Inguinal Nodes: No significant inguinal adenopathy Psychological:  Alert and cooperative. Normal mood and affect   Assessment and Recommendations:  1. Screening for colorectal cancer.  He is trying to decide between a screening colonoscopy and Cologuard.  We discussed the pros and cons of both modalities for colorectal cancer screening.  I addressed his questions to his satisfaction.  He is  still uncertain as to how he would like to proceed. He will further consider and contact us when he decides.  2.  History of bleeding gastric ulcer in 2017.  Confirmed healing by repeat EGD.  Advised to minimize or avoid NSAIDs.  Advised famotidine 40 mg daily long-term.   cc: Biagio Borg, MD [address] [place],   [address]

## 2020-05-26 ENCOUNTER — Ambulatory Visit: Payer: Medicare Other

## 2020-05-26 ENCOUNTER — Other Ambulatory Visit: Payer: Self-pay

## 2020-05-26 DIAGNOSIS — M6281 Muscle weakness (generalized): Secondary | ICD-10-CM

## 2020-05-26 DIAGNOSIS — R293 Abnormal posture: Secondary | ICD-10-CM

## 2020-05-26 DIAGNOSIS — M4316 Spondylolisthesis, lumbar region: Secondary | ICD-10-CM

## 2020-05-26 DIAGNOSIS — M5442 Lumbago with sciatica, left side: Secondary | ICD-10-CM

## 2020-05-26 NOTE — Therapy (Signed)
[place], [place], [address] Phone: [phone]   Fax:  [phone]  Physical Therapy Treatment  Patient Details  Name: Martin Archer MRN: [id number] Date of Birth: [birth year] Referring Provider (PT): Martin Leader, MD   Encounter Date: 05/26/2020   PT End of Session - 05/26/20 0938    Visit Number 2    Number of Visits 12    Date for PT Re-Evaluation 06/23/20    Authorization Type Medicare    Progress Note Due on Visit 10    PT Start Time 0933    PT Stop Time 1014    PT Time Calculation (min) 41 min    Activity Tolerance Patient tolerated treatment well    Behavior During Therapy [facility] for tasks assessed/performed           Past Medical History:  Diagnosis Date  . Anemia, unspecified   . Arthritis   . Diverticulosis of colon (without mention of hemorrhage)   . Gastric ulcer with hemorrhage and obstruction 2011   [facility]  . GERD (gastroesophageal reflux disease)    hx  . Hyperlipemia   . Hypertension   . Personal history of colonic polyps 01/30/2010   hyperplastic rectal  . Sleep apnea    on CPAP; [facility]  . Stroke [facility])    mini strokes-bilateral carotid endarterectomy    Past Surgical History:  Procedure Laterality Date  . CAROTID ENDARTERECTOMY  11/2006   left [facility]  . COLONOSCOPY  2011   DP-positive FOBT/IDA  . ENDARTERECTOMY Right 01/19/2015   Procedure: RIGHT CAROTID ENDARTERECTOMY WITH PATCH ANGIOPLASTY;  Surgeon: Martin Misty, MD;  Location: [facility];  Service: Vascular;  Laterality: Right;  . ESOPHAGOGASTRODUODENOSCOPY Left 06/17/2013   Procedure: ESOPHAGOGASTRODUODENOSCOPY (EGD);  Surgeon: Arta Silence, MD;  Location: [facility];  Service: [facility];  Laterality: Left;  . ESOPHAGOGASTRODUODENOSCOPY N/A 09/20/2015   Procedure: ESOPHAGOGASTRODUODENOSCOPY (EGD);  Surgeon: Ladene Artist, MD;  Location: [facility];  Service: [facility];   Laterality: N/A;  . [facility] SURGERY Bilateral 2020   [facility]  . [place]  . NASAL FRACTURE SURGERY  1974  . TEE WITHOUT CARDIOVERSION N/A 01/17/2015   Procedure: TRANSESOPHAGEAL ECHOCARDIOGRAM (TEE);  Surgeon: Sanda Klein, MD;  Location: [facility];  Service: Cardiovascular;  Laterality: N/A;  . WISDOM TOOTH EXTRACTION      There were no vitals filed for this visit.   Subjective Assessment - 05/26/20 0936    Subjective Pt reports he has been doing his exercises 1x/day every day. He feels they irritate him, maybe the [place]. "It tends to get sore then it goes away." He states he is taking tylenol. He has been thinking about this the last 2 weeks and is leaing toward surgery. "In all actuality, I feel better and have more stamina the last couple of weeks."    Limitations Standing;Walking;House hold activities;Sitting;Lifting    How long can you walk comfortably? a block or 2    Diagnostic tests XR Lumbar spine: "significant shift of the vertebrae almost 3.3 cm forward"    Patient Stated Goals To increase stamina and activity level again    Currently in Pain? No/denies    Pain Onset More than a month ago                             [facility] Adult PT Treatment/Exercise - 05/26/20 0001  Exercises   Exercises Lumbar      Lumbar Exercises: Stretches   Single Knee to Chest Stretch 2 reps;20 seconds    Double Knee to Chest Stretch 3 reps;10 seconds    Double Knee to Chest Stretch Limitations added slow single leg lowers for core    Lower Trunk Rotation 5 reps;10 seconds    Figure 4 Stretch 2 reps;20 seconds    Figure 4 Stretch Limitations + KTOS      Lumbar Exercises: Supine   Pelvic Tilt 10 reps;10 seconds    Bent Knee Raise 10 reps    Bent Knee Raise Limitations + PPT    Bridge 10 reps;5 seconds                    PT Short Term Goals - 05/12/20 1640      PT SHORT TERM GOAL #1   Title Pt will be I and compliant with initial  HEP.    Time 2    Period Weeks    Status New    Target Date 05/26/20             PT Long Term Goals - 05/12/20 1643      PT LONG TERM GOAL #1   Title Pt will be I and compliant with long term HEP for maintenance and progression.    Time 6    Period Weeks    Status New    Target Date 06/23/20      PT LONG TERM GOAL #2   Title Pt will demo lumbar ROM WFL and pain free for ADLs and IADLs.    Time 6    Period Weeks    Status New    Target Date 06/23/20      PT LONG TERM GOAL #3   Title Pt will demo double leg lower to 45 without pain for improved lumbar support and core stabilization.    Time 6    Period Weeks    Status New    Target Date 06/23/20      PT LONG TERM GOAL #4   Title Pt will increase abilty to walk for 30 minutes, in order for exercise and weight loss.    Time 6    Period Weeks    Status New    Target Date 06/23/20      PT LONG TERM GOAL #5   Title Pt will be able to stand for 10 minutes to perform errands and household activities.    Time 6    Period Weeks    Status New    Target Date 06/23/20                 Plan - 05/26/20 0939    Clinical Impression Statement Well tolerated tx with review of HEP and correction in form, aiding pt in no pain throughout session. Pt mildly progressed to more core strengthening with slow single leg lowers from [facility] and bridge. Advised to try to walk after performing exercises.    Personal Factors and Comorbidities Comorbidity 1;Comorbidity 2;Comorbidity 3+;Past/Current Experience;Time since onset of injury/illness/exacerbation    Comorbidities Arthritis, Stroke, Anemia, HLD, HTN, OSA    Examination-Activity Limitations Locomotion Level;Sit;Stand;Squat;Lift;Bend    Examination-Participation Restrictions Occupation;Shop;Community Activity    Stability/Clinical Decision Making Evolving/Moderate complexity    Rehab Potential Good    PT Frequency 2x / week    PT Duration 6 weeks    PT Treatment/Interventions  ADLs/Self Care Home Management;Moist Heat;Iontophoresis 4mg /ml Dexamethasone;Traction;Electrical  Stimulation;Cryotherapy;Neuromuscular re-education;Balance training;Therapeutic exercise;Therapeutic activities;Functional mobility training;Spinal Manipulations;Joint Manipulations;Dry needling;Passive range of motion;Patient/family education;Manual techniques    PT Next Visit Plan update HEP PRN, add further core stab, lumbopelvic posture and rhythm    PT Home Exercise Plan SKTC, DKTC, Figure 4 S, Pirif S, PPT    Consulted and Agree with Plan of Care Patient           Patient will benefit from skilled therapeutic intervention in order to improve the following deficits and impairments:  Pain, Improper body mechanics, Impaired flexibility, Hypomobility, Postural dysfunction, Decreased mobility, Decreased strength, Decreased activity tolerance, Impaired perceived functional ability, Decreased range of motion, Increased fascial restricitons  Visit Diagnosis: Spondylolisthesis of lumbar region  Chronic bilateral low back pain with bilateral sciatica  Abnormal posture  Muscle weakness (generalized)     Problem List Patient Active Problem List   Diagnosis Date Noted  . Spondylolisthesis of lumbosacral region 05/03/2020  . CKD (chronic kidney disease) stage 3, GFR 30-59 ml/min 09/26/2017  . Syncope 09/26/2017  . Elevated transaminase level 07/25/2017  . Cannabis abuse 07/23/2017  . Uncomplicated alcohol dependence ([employer]) 07/23/2017  . Melena   . Gastric ulcer with hemorrhage   . Right hemiparesis ([place]) 09/18/2015  . Chronic blood loss anemia 09/18/2015  . Hematemesis 09/18/2015  . Wellness examination 06/29/2015  . History of CEA (carotid endarterectomy) 03/25/2015  . HLD (hyperlipidemia) 03/25/2015  . Tobacco use disorder 03/25/2015  . Carotid artery occlusion with infarction ([place]) 03/25/2015  . Carotid aneurysm, right ([place]) 01/19/2015  . Stroke ([place])   . Aortic arch atherosclerosis  ([place])   . Carotid stenosis 01/16/2015  . Thrombus: BILATERAL EXTERNAL JUGULAR VEINS per CT angio head and neck 01/16/15 01/16/2015  . DVT (deep venous thrombosis) ([place])   . Numbness and tingling of left arm and leg 01/15/2015  . CVA (cerebral infarction)   . Paresthesia 12/08/2014  . 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 ADD (attention deficit disorder) 07/14/2013  . Nicotine dependence 06/18/2013  . Back pain, chronic 06/17/2013  . ACUTE GASTRIC ULCER W/HEMORRHAGE AND OBSTRUCTION 02/28/2010  . PERSONAL HX COLONIC POLYPS 02/28/2010  . DIVERTICULOSIS, COLON 02/02/2010  . ANEMIA, SEVERE 01/13/2010  . Essential hypertension 01/13/2010  . Sleep apnea 01/13/2010  . Left carotid bruit 01/13/2010  . MIXED HEARING LOSS BILATERAL 02/16/2009    Izell Belmar, PT, DPT 05/26/2020, 10:49 AM  [facility] [address] [place], [place], [address] Phone: [phone]   Fax:  [phone]  Name: Martin Archer MRN: [id number] Date of Birth: May 21, 1961

## 2020-05-31 ENCOUNTER — Ambulatory Visit: Payer: Medicare Other | Attending: Family Medicine

## 2020-05-31 ENCOUNTER — Other Ambulatory Visit: Payer: Self-pay

## 2020-05-31 DIAGNOSIS — G8929 Other chronic pain: Secondary | ICD-10-CM | POA: Insufficient documentation

## 2020-05-31 DIAGNOSIS — R293 Abnormal posture: Secondary | ICD-10-CM | POA: Diagnosis present

## 2020-05-31 DIAGNOSIS — M5441 Lumbago with sciatica, right side: Secondary | ICD-10-CM | POA: Diagnosis present

## 2020-05-31 DIAGNOSIS — M4316 Spondylolisthesis, lumbar region: Secondary | ICD-10-CM | POA: Diagnosis not present

## 2020-05-31 DIAGNOSIS — M5442 Lumbago with sciatica, left side: Secondary | ICD-10-CM | POA: Diagnosis present

## 2020-05-31 DIAGNOSIS — M6281 Muscle weakness (generalized): Secondary | ICD-10-CM | POA: Diagnosis present

## 2020-05-31 NOTE — Therapy (Signed)
[name], [place], [address] Phone: [phone]   Fax:  [phone]  Physical Therapy Treatment  Patient Details  Name: Martin [facility] MRN: [id number] Date of Birth: Mar 09, 1961 Referring Provider (PT): Lynne Leader, MD   Encounter Date: 05/31/2020   PT End of Session - 05/31/20 0850    Visit Number 3    Number of Visits 12    Date for PT Re-Evaluation 06/23/20    Authorization Type Medicare    Progress Note Due on Visit 10    PT Start Time 0847    PT Stop Time 0933    PT Time Calculation (min) 46 min    Activity Tolerance Patient tolerated treatment well    Behavior During Therapy [facility] for tasks assessed/performed           Past Medical History:  Diagnosis Date  . Anemia, unspecified   . Arthritis   . Diverticulosis of colon (without mention of hemorrhage)   . Gastric ulcer with hemorrhage and obstruction 2011   Dr [facility]  . GERD (gastroesophageal reflux disease)    hx  . Hyperlipemia   . Hypertension   . Personal history of colonic polyps 01/30/2010   hyperplastic rectal  . Sleep apnea    on CPAP; [facility]  . Stroke [facility])    mini strokes-bilateral carotid endarterectomy    Past Surgical History:  Procedure Laterality Date  . CAROTID ENDARTERECTOMY  11/2006   left Dr Mamie Nick  . COLONOSCOPY  2011   DP-positive FOBT/IDA  . ENDARTERECTOMY Right 01/19/2015   Procedure: RIGHT CAROTID ENDARTERECTOMY WITH PATCH ANGIOPLASTY;  Surgeon: Mal Misty, MD;  Location: [place];  Service: Vascular;  Laterality: Right;  . ESOPHAGOGASTRODUODENOSCOPY Left 06/17/2013   Procedure: ESOPHAGOGASTRODUODENOSCOPY (EGD);  Surgeon: Arta Silence, MD;  Location: [facility] ENDOSCOPY;  Service: Endoscopy;  Laterality: Left;  . ESOPHAGOGASTRODUODENOSCOPY N/A 09/20/2015   Procedure: ESOPHAGOGASTRODUODENOSCOPY (EGD);  Surgeon: Ladene Artist, MD;  Location: [facility] ENDOSCOPY;  Service: Endoscopy;   Laterality: N/A;  . FOOT SURGERY Bilateral 2020   Taylor's foot  . [place]  . NASAL FRACTURE SURGERY  1974  . TEE WITHOUT CARDIOVERSION N/A 01/17/2015   Procedure: TRANSESOPHAGEAL ECHOCARDIOGRAM (TEE);  Surgeon: Sanda Klein, MD;  Location[facility] END[facility]COPY;  Service: Cardiovascular;  Laterality: N/A;  . WISDOM TOOTH EXTRACTION      There were no vitals filed for this visit.   Subjective Assessment - 05/31/20 0850    Subjective Pt reports he had LBP yesterday, but curled up into the fetal position last night and went right to sleep. He feels lack of mobility overall.    Limitations Standing;Walking;House hold activities;Sitting;Lifting    How long can you walk comfortably? a block or 2    Diagnostic tests XR Lumbar spine: "significant shift of the vertebrae almost 3.3 cm forward"    Patient Stated Goals To increase stamina and activity level again    Currently in Pain? No/denies    Pain Onset More than a month ago                             [facility] Adult PT Treatment/Exercise - 05/31/20 0001      Lumbar Exercises: Stretches   Single Knee to Chest Stretch Right;Left;1 rep;60 seconds    Single Knee to Chest Stretch Limitations OP to thigh    Double Knee to Chest Stretch 2  reps;20 seconds    Hip Flexor Stretch Right;Left;1 rep;60 seconds    Hip Flexor Stretch Limitations OP to thigh    Figure 4 Stretch 2 reps;20 seconds    Figure 4 Stretch Limitations seated      Lumbar Exercises: Aerobic   Nustep L6 UE/LE 6'      Lumbar Exercises: Supine   Pelvic Tilt 10 reps;10 seconds    Bent Knee Raise 10 reps    Bent Knee Raise Limitations + PPT    Bridge 10 reps;5 seconds    Bridge Limitations OP to knees toward hips    Other Supine Lumbar Exercises 90/90 Tabletop holds 7x10"   lift/lower into pos'n 1 LE at a time     Lumbar Exercises: Sidelying   Other Sidelying Lumbar Exercises Open books x15 B                    PT Short Term Goals -  05/12/20 1640      PT SHORT TERM GOAL #1   Title Pt will be I and compliant with initial HEP.    Time 2    Period Weeks    Status New    Target Date 05/26/20             PT Long Term Goals - 05/12/20 1643      PT LONG TERM GOAL #1   Title Pt will be I and compliant with long term HEP for maintenance and progression.    Time 6    Period Weeks    Status New    Target Date 06/23/20      PT LONG TERM GOAL #2   Title Pt will demo lumbar ROM WFL and pain free for ADLs and IADLs.    Time 6    Period Weeks    Status New    Target Date 06/23/20      PT LONG TERM GOAL #3   Title Pt will demo double leg lower to 45 without pain for improved lumbar support and core stabilization.    Time 6    Period Weeks    Status New    Target Date 06/23/20      PT LONG TERM GOAL #4   Title Pt will increase abilty to walk for 30 minutes, in order for exercise and weight loss.    Time 6    Period Weeks    Status New    Target Date 06/23/20      PT LONG TERM GOAL #5   Title Pt will be able to stand for 10 minutes to perform errands and household activities.    Time 6    Period Weeks    Status New    Target Date 06/23/20                 Plan - 05/31/20 0850    Clinical Impression Statement Pt tolerated tx well with progression of core work to tabletop holds with notable muscle fatigue and shaking, but ability to perform with proper form and no back pain. Pt provided with further HEP updates including thomas S secondary to tight hip flexors. Will continue to progress core strength and into more challenging positions than supine as tolerated.    Personal Factors and Comorbidities Comorbidity 1;Comorbidity 2;Comorbidity 3+;Past/Current Experience;Time since onset of injury/illness/exacerbation    Comorbidities Arthritis, Stroke, Anemia, HLD, HTN, OSA    Examination-Activity Limitations Locomotion Level;Sit;Stand;Squat;Lift;Bend    Examination-Participation Restrictions  Occupation;Shop;Community Activity  Stability/Clinical Decision Making Evolving/Moderate complexity    Rehab Potential Good    PT Frequency 2x / week    PT Duration 6 weeks    PT Treatment/Interventions ADLs/Self Care Home Management;Moist Heat;Iontophoresis 4mg /ml Dexamethasone;Traction;Electrical Stimulation;Cryotherapy;Neuromuscular re-education;Balance training;Therapeutic exercise;Therapeutic activities;Functional mobility training;Spinal Manipulations;Joint Manipulations;Dry needling;Passive range of motion;Patient/family education;Manual techniques    PT Next Visit Plan update HEP PRN, progress core stab, lumbopelvic posture and rhythm    PT Home Exercise Plan SKTC, DKTC, Figure 4 S, Pirif S, PPT    Consulted and Agree with Plan of Care Patient           Patient will benefit from skilled therapeutic intervention in order to improve the following deficits and impairments:  Pain, Improper body mechanics, Impaired flexibility, Hypomobility, Postural dysfunction, Decreased mobility, Decreased strength, Decreased activity tolerance, Impaired perceived functional ability, Decreased range of motion, Increased fascial restricitons  Visit Diagnosis: Spondylolisthesis of lumbar region  Chronic bilateral low back pain with bilateral sciatica  Abnormal posture  Muscle weakness (generalized)     Problem List Patient Active Problem List   Diagnosis Date Noted  . Spondylolisthesis of lumbosacral region 05/03/2020  . CKD (chronic kidney disease) stage 3, GFR 30-59 ml/min 09/26/2017  . Syncope 09/26/2017  . Elevated transaminase level 07/25/2017  . Cannabis abuse 07/23/2017  . Uncomplicated alcohol dependence ([place]) 07/23/2017  . Melena   . Gastric ulcer with hemorrhage   . Right hemiparesis ([place]) 09/18/2015  . Chronic blood loss anemia 09/18/2015  . Hematemesis 09/18/2015  . Wellness examination 06/29/2015  . History of CEA (carotid endarterectomy) 03/25/2015  . HLD  (hyperlipidemia) 03/25/2015  . Tobacco use disorder 03/25/2015  . Carotid artery occlusion with infarction ([place]) 03/25/2015  . Carotid aneurysm, right ([place]) 01/19/2015  . Stroke ([place])   . Aortic arch atherosclerosis ([place])   . Carotid stenosis 01/16/2015  . Thrombus: BILATERAL EXTERNAL JUGULAR VEINS per CT angio head and neck 01/16/15 01/16/2015  . DVT (deep venous thrombosis) ([place])   . Numbness and tingling of left arm and leg 01/15/2015  . CVA (cerebral infarction)   . Paresthesia 12/08/2014  . 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 ADD (attention deficit disorder) 07/14/2013  . Nicotine dependence 06/18/2013  . Back pain, chronic 06/17/2013  . ACUTE GASTRIC ULCER W/HEMORRHAGE AND OBSTRUCTION 02/28/2010  . PERSONAL HX COLONIC POLYPS 02/28/2010  . DIVERTICULOSIS, COLON 02/02/2010  . ANEMIA, SEVERE 01/13/2010  . Essential hypertension 01/13/2010  . Sleep apnea 01/13/2010  . Left carotid bruit 01/13/2010  . MIXED HEARING LOSS BILATERAL 02/16/2009    Izell Dupree, PT, DPT 05/31/2020, 10:53 AM  [facility] [address] [place], [place], [address] Phone: [phone]   Fax:  [phone]  Name: Shah Insley MRN: [id number] Date of Birth: 1961/01/14

## 2020-06-02 ENCOUNTER — Encounter: Payer: Self-pay | Admitting: Family Medicine

## 2020-06-02 ENCOUNTER — Ambulatory Visit: Payer: Medicare Other | Admitting: Physical Therapy

## 2020-06-02 ENCOUNTER — Other Ambulatory Visit: Payer: Self-pay

## 2020-06-02 ENCOUNTER — Ambulatory Visit (INDEPENDENT_AMBULATORY_CARE_PROVIDER_SITE_OTHER): Payer: Medicare Other | Admitting: Family Medicine

## 2020-06-02 VITALS — BP 140/90 | HR 89 | Ht 68.0 in | Wt 210.0 lb

## 2020-06-02 DIAGNOSIS — M5441 Lumbago with sciatica, right side: Secondary | ICD-10-CM | POA: Diagnosis not present

## 2020-06-02 DIAGNOSIS — M4317 Spondylolisthesis, lumbosacral region: Secondary | ICD-10-CM | POA: Diagnosis not present

## 2020-06-02 DIAGNOSIS — R202 Paresthesia of skin: Secondary | ICD-10-CM | POA: Diagnosis not present

## 2020-06-02 DIAGNOSIS — G8929 Other chronic pain: Secondary | ICD-10-CM

## 2020-06-02 DIAGNOSIS — M5442 Lumbago with sciatica, left side: Secondary | ICD-10-CM

## 2020-06-02 NOTE — Patient Instructions (Signed)
Thank you for coming in today.  I've referred you to Dr Lynann Bologna orthopedic spine surgery at [place].  Let us know if you don't hear from them in one week.  Recheck with me as needed.

## 2020-06-02 NOTE — Progress Notes (Signed)
Rito Ehrlich, am serving as a Education administrator for Dr. Lynne Leader.  Martin [facility] is a 59 y.o. male who presents to [place] [facility] today for f/u of chronic LBP and B LE radicular pain and numbness/tingling.  He was last seen by Dr. Georgina Snell on 05/03/20 and was referred to PT of which he has completed 3 visits.  Pt is on permanent disability due to LBP.  Since his last visit, pt reports that PT is helping and he feels about high 80's low 90's% better and was even able to play golf yesterday. Patient states had no issues while playing golf as he was taught techniques to keep pressure of his back during his swing, but after the game he had some pain and took tylenol to help with that pain. Patient does want to talk to a surgeon to talk about his options.  Additionally he works at [employer]  He thinks he can return to work with a 20 pound lifting restriction.  His employer is okay with this he reports.  Diagnostic testing: Lumbar XR- 05/03/20   Pertinent review of systems: No fevers or chills  Relevant historical information: History DVT.  History of carotid aneurysm and carotid artery occlusion with cerebral infarct now much better.  Sleep apnea.   Exam:  BP 140/90 (BP Location: Left Arm, Patient Position: Sitting, Cuff Size: Large)   Pulse 89   Ht 5\' 8"  (1.727 m)   Wt 210 lb (95.3 kg)   SpO2 98%   BMI 31.93 kg/m  General: Well Developed, well nourished, and in no acute distress.   MSK: Normal gait.  Lower extremity strength is intact.    Lab and Radiology Results DG Lumbar Spine Complete W/Bend  Result Date: 05/03/2020 CLINICAL DATA:  Low back pain EXAM: LUMBAR SPINE - COMPLETE WITH BENDING VIEWS COMPARISON:  None. FINDINGS: Frontal, neutral lateral, flexion lateral, extension lateral, spot lumbosacral lateral, and bilateral oblique views were obtained. There are 5 non-rib-bearing lumbar type vertebral bodies. There is no acute fracture. On neutral lateral  imaging, there is 3.3 cm of L5 on S1. There is moderately severe disc space narrowing at L5-S1. Other disc spaces appear unremarkable. With flexion, there is 3.3 cm of anterolisthesis of L5 on S1. With extension, there is 3.3 cm of anterolisthesis of L5 on S1. No other spondylolisthesis. There is an apparent pars defect at L5 on the right. There is severe disc space narrowing at L5-S1. Other disc spaces appear unremarkable. There is no appreciable facet arthropathy. 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 3.3 cm of anterolisthesis of L5 on S1 which does not change significantly between neutral lateral, flexion lateral, and extension lateral imaging. No other spondylolisthesis. No acute fracture evident. Moderately severe disc space narrowing at L5-S1. No other appreciable disc space narrowing. No appreciable facet arthropathy. Electronically Signed   By: Lowella Grip III M.D.   On: 05/03/2020 14:32   I, Lynne Leader, personally (independently) visualized and performed the interpretation of the images attached in this note. Significant spondylolisthesis at L5-S1 with significant shift forward    Assessment and Plan: 59 y.o. male with low back pain significant improvement with physical therapy.  His paresthesias in radiculopathy have also improved.  Plan to continue home exercise program and PT.  Okay to resume work with 20 pound lifting restriction.  We will fill out return to work form for [employer] and fax today.  However given his severity of spondylolisthesis  I still think it is worth having a conversation with spine surgery regarding surgical options and prognosis.  Referral to Dr. Lynann Bologna today.  Recheck with me as needed  Orders Placed This Encounter  Procedures  . Ambulatory referral to Orthopedic Surgery    Referral Priority:   Routine    Referral Type:   Surgical    Referral Reason:   Specialty Services Required    Referred to Provider:   Phylliss Bob, MD     Requested Specialty:   Orthopedic Surgery    Number of Visits Requested:   1   No orders of the defined types were placed in this encounter.    Discussed warning signs or symptoms. Please see discharge instructions. Patient expresses understanding.   The above documentation has been reviewed and is accurate and complete Lynne Leader, M.D. Total encounter time 20 minutes including face-to-face time with the patient and, reviewing past medical record, and charting on the date of service.  Plan and options

## 2020-06-07 ENCOUNTER — Other Ambulatory Visit: Payer: Self-pay

## 2020-06-07 ENCOUNTER — Encounter: Payer: Self-pay | Admitting: Physical Therapy

## 2020-06-07 ENCOUNTER — Ambulatory Visit: Payer: Medicare Other | Admitting: Physical Therapy

## 2020-06-07 DIAGNOSIS — G8929 Other chronic pain: Secondary | ICD-10-CM

## 2020-06-07 DIAGNOSIS — R293 Abnormal posture: Secondary | ICD-10-CM

## 2020-06-07 DIAGNOSIS — M6281 Muscle weakness (generalized): Secondary | ICD-10-CM

## 2020-06-07 DIAGNOSIS — M4316 Spondylolisthesis, lumbar region: Secondary | ICD-10-CM | POA: Diagnosis not present

## 2020-06-08 NOTE — Therapy (Signed)
[place], [place], [address] Phone: [phone]   Fax:  [phone]  Physical Therapy Treatment  Patient Details  Name: Martin Archer MRN: [id number] Date of Birth: [birth year] Referring Provider (PT): Lynne Leader, MD   Encounter Date: 06/07/2020   PT End of Session - 06/08/20 0807    Visit Number 4    Number of Visits 12    Date for PT Re-Evaluation 06/23/20    Authorization Type Medicare    PT Start Time 0930    PT Stop Time 1012    PT Time Calculation (min) 42 min    Activity Tolerance Patient tolerated treatment well    Behavior During Therapy [facility] for tasks assessed/performed           Past Medical History:  Diagnosis Date  . Anemia, unspecified   . Arthritis   . Diverticulosis of colon (without mention of hemorrhage)   . Gastric ulcer with hemorrhage and obstruction 2011   Dr Fidela [facility]  . GERD (gastroesophageal reflux disease)    hx  . Hyperlipemia   . Hypertension   . Personal history of colonic polyps 01/30/2010   hyperplastic rectal  . Sleep apnea    on CPAP; [facility]  . Stroke [facility])    mini strokes-bilateral carotid endarterectomy    Past Surgical History:  Procedure Laterality Date  . CAROTID ENDARTERECTOMY  11/2006   left Dr Mamie Nick  . COLONOSCOPY  2011   DP-positive FOBT/IDA  . ENDARTERECTOMY Right 01/19/2015   Procedure: RIGHT CAROTID ENDARTERECTOMY WITH PATCH ANGIOPLASTY;  Surgeon: Mal Misty, MD;  Location: [place];  Service: Vascular;  Laterality: Right;  . ESOPHAGOGASTRODUODENOSCOPY Left 06/17/2013   Procedure: ESOPHAGOGASTRODUODENOSCOPY (EGD);  Surgeon: Arta Silence, MD;  Location: [facility] ENDOSCOPY;  Service: Endoscopy;  Laterality: Left;  . ESOPHAGOGASTRODUODENOSCOPY N/A 09/20/2015   Procedure: ESOPHAGOGASTRODUODENOSCOPY (EGD);  Surgeon: Ladene Artist, MD;  Location: [facility] ENDOSCOPY;  Service: Endoscopy;  Laterality: N/A;  . FOOT SURGERY  Bilateral 2020   Taylor's foot  . [place]  . NASAL FRACTURE SURGERY  1974  . TEE WITHOUT CARDIOVERSION N/A 01/17/2015   Procedure: TRANSESOPHAGEAL ECHOCARDIOGRAM (TEE);  Surgeon: Sanda Klein, MD;  Location: [facility] ENDOSCOPY;  Service: Cardiovascular;  Laterality: N/A;  . WISDOM TOOTH EXTRACTION      There were no vitals filed for this visit.   Subjective Assessment - 06/07/20 1455    Subjective Patient reports that he was able to play golf. He continues to have some stiffness but overall he is feeling better.    Limitations Standing;Walking;House hold activities;Sitting;Lifting    How long can you walk comfortably? a block or 2    Diagnostic tests XR Lumbar spine: "significant shift of the vertebrae almost 3.3 cm forward"    Patient Stated Goals To increase stamina and activity level again    Currently in Pain? No/denies                             [facility] Adult PT Treatment/Exercise - 06/08/20 0001      Lumbar Exercises: Standing   Other Standing Lumbar Exercises pallof press 2x10 each direction green ; chop green 2x10       Lumbar Exercises: Supine   Pelvic Tilt 10 reps;10 seconds    Bent Knee Raise 10 reps    Bent Knee Raise Limitations + PPT    Bridge 10 reps;5  seconds    Bridge Limitations OP to knees toward hips      Lumbar Exercises: Sidelying   Other Sidelying Lumbar Exercises Open books x15 B                  PT Education - 06/08/20 0806    Education Details reviewed golf specific low back training exercises    Person(s) Educated Patient    Methods Explanation;Demonstration;Tactile cues;Verbal cues    Comprehension Verbalized understanding;Returned demonstration;Verbal cues required;Tactile cues required            PT Short Term Goals - 06/08/20 0812      PT SHORT TERM GOAL #1   Title Pt will be I and compliant with initial HEP.    Time 2    Period Weeks    Status On-going    Target Date 05/26/20              PT Long Term Goals - 05/12/20 1643      PT LONG TERM GOAL #1   Title Pt will be I and compliant with long term HEP for maintenance and progression.    Time 6    Period Weeks    Status New    Target Date 06/23/20      PT LONG TERM GOAL #2   Title Pt will demo lumbar ROM WFL and pain free for ADLs and IADLs.    Time 6    Period Weeks    Status New    Target Date 06/23/20      PT LONG TERM GOAL #3   Title Pt will demo double leg lower to 45 without pain for improved lumbar support and core stabilization.    Time 6    Period Weeks    Status New    Target Date 06/23/20      PT LONG TERM GOAL #4   Title Pt will increase abilty to walk for 30 minutes, in order for exercise and weight loss.    Time 6    Period Weeks    Status New    Target Date 06/23/20      PT LONG TERM GOAL #5   Title Pt will be able to stand for 10 minutes to perform errands and household activities.    Time 6    Period Weeks    Status New    Target Date 06/23/20                 Plan - 06/08/20 0809    Clinical Impression Statement Patient is making progress. He perfromed all exercises withoutincreased pain. Therapy added chops from shoulder to hip and pallow press to improve rotational stability with golf. He was given an updated HEP.    Personal Factors and Comorbidities Comorbidity 1;Comorbidity 2;Comorbidity 3+;Past/Current Experience;Time since onset of injury/illness/exacerbation    Comorbidities Arthritis, Stroke, Anemia, HLD, HTN, OSA    Examination-Activity Limitations Locomotion Level;Sit;Stand;Squat;Lift;Bend    Examination-Participation Restrictions Occupation;Shop;Community Activity    Stability/Clinical Decision Making Evolving/Moderate complexity    Clinical Decision Making Moderate    Rehab Potential Good    PT Frequency 2x / week    PT Duration 6 weeks    PT Treatment/Interventions ADLs/Self Care Home Management;Moist Heat;Iontophoresis 4mg /ml  Dexamethasone;Traction;Electrical Stimulation;Cryotherapy;Neuromuscular re-education;Balance training;Therapeutic exercise;Therapeutic activities;Functional mobility training;Spinal Manipulations;Joint Manipulations;Dry needling;Passive range of motion;Patient/family education;Manual techniques    PT Next Visit Plan update HEP PRN, progress core stab, lumbopelvic posture and rhythm    PT Home Exercise Plan SKTC,  DKTC, Figure 4 S, Pirif S, PPT    Consulted and Agree with Plan of Care Patient           Patient will benefit from skilled therapeutic intervention in order to improve the following deficits and impairments:  Pain, Improper body mechanics, Impaired flexibility, Hypomobility, Postural dysfunction, Decreased mobility, Decreased strength, Decreased activity tolerance, Impaired perceived functional ability, Decreased range of motion, Increased fascial restricitons  Visit Diagnosis: Spondylolisthesis of lumbar region  Chronic bilateral low back pain with bilateral sciatica  Abnormal posture  Muscle weakness (generalized)     Problem List Patient Active Problem List   Diagnosis Date Noted  . Spondylolisthesis of lumbosacral region 05/03/2020  . CKD (chronic kidney disease) stage 3, GFR 30-59 ml/min 09/26/2017  . Syncope 09/26/2017  . Elevated transaminase level 07/25/2017  . Cannabis abuse 07/23/2017  . Uncomplicated alcohol dependence ([place]) 07/23/2017  . Melena   . Gastric ulcer with hemorrhage   . Right hemiparesis ([place]) 09/18/2015  . Chronic blood loss anemia 09/18/2015  . Hematemesis 09/18/2015  . Wellness examination 06/29/2015  . History of CEA (carotid endarterectomy) 03/25/2015  . HLD (hyperlipidemia) 03/25/2015  . Tobacco use disorder 03/25/2015  . Carotid artery occlusion with infarction ([place]) 03/25/2015  . Carotid aneurysm, right ([place]) 01/19/2015  . Stroke ([place])   . Aortic arch atherosclerosis ([place])   . Carotid stenosis 01/16/2015  . Thrombus: BILATERAL  EXTERNAL JUGULAR VEINS per CT angio head and neck 01/16/15 01/16/2015  . DVT (deep venous thrombosis) ([place])   . Numbness and tingling of left arm and leg 01/15/2015  . CVA (cerebral infarction)   . Paresthesia 12/08/2014  . 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 ADD (attention deficit disorder) 07/14/2013  . Nicotine dependence 06/18/2013  . Back pain, chronic 06/17/2013  . ACUTE GASTRIC ULCER W/HEMORRHAGE AND OBSTRUCTION 02/28/2010  . PERSONAL HX COLONIC POLYPS 02/28/2010  . DIVERTICULOSIS, COLON 02/02/2010  . ANEMIA, SEVERE 01/13/2010  . Essential hypertension 01/13/2010  . Sleep apnea 01/13/2010  . Left carotid bruit 01/13/2010  . MIXED HEARING LOSS BILATERAL 02/16/2009    Carney Living PT DPT  06/08/2020, 8:15 AM  [facility] [address] [place], [place], [address] Phone: [phone]   Fax:  [phone]  Name: Martin Archer MRN: [id number] Date of Birth: 03-Dec-1960

## 2020-06-09 ENCOUNTER — Ambulatory Visit: Payer: Medicare Other

## 2020-06-09 ENCOUNTER — Other Ambulatory Visit: Payer: Self-pay

## 2020-06-09 DIAGNOSIS — M6281 Muscle weakness (generalized): Secondary | ICD-10-CM

## 2020-06-09 DIAGNOSIS — M4316 Spondylolisthesis, lumbar region: Secondary | ICD-10-CM

## 2020-06-09 DIAGNOSIS — G8929 Other chronic pain: Secondary | ICD-10-CM

## 2020-06-09 DIAGNOSIS — R293 Abnormal posture: Secondary | ICD-10-CM

## 2020-06-09 NOTE — Therapy (Signed)
[place], [place], [address] Phone: [phone]   Fax:  [phone]  Physical Therapy Treatment  Patient Details  Name: Martin Archer MRN: [id number] Date of Birth: Oct 26, 1960 Referring Provider (PT): Lynne Leader, MD   Encounter Date: 06/09/2020   PT End of Session - 06/09/20 1021    Visit Number 5    Number of Visits 12    Date for PT Re-Evaluation 06/23/20    Authorization Type Medicare    PT Start Time 1017    PT Stop Time 1102    PT Time Calculation (min) 45 min    Activity Tolerance Patient tolerated treatment well    Behavior During Therapy [facility] for tasks assessed/performed           Past Medical History:  Diagnosis Date  . Anemia, unspecified   . Arthritis   . Diverticulosis of colon (without mention of hemorrhage)   . Gastric ulcer with hemorrhage and obstruction 2011   Dr Fidela [facility]  . GERD (gastroesophageal reflux disease)    hx  . Hyperlipemia   . Hypertension   . Personal history of colonic polyps 01/30/2010   hyperplastic rectal  . Sleep apnea    on CPAP; [facility]  . Stroke [facility])    mini strokes-bilateral carotid endarterectomy    Past Surgical History:  Procedure Laterality Date  . CAROTID ENDARTERECTOMY  11/2006   left Dr Mamie Nick  . COLONOSCOPY  2011   DP-positive FOBT/IDA  . ENDARTERECTOMY Right 01/19/2015   Procedure: RIGHT CAROTID ENDARTERECTOMY WITH PATCH ANGIOPLASTY;  Surgeon: Mal Misty, MD;  Location: [place];  Service: Vascular;  Laterality: Right;  . ESOPHAGOGASTRODUODENOSCOPY Left 06/17/2013   Procedure: ESOPHAGOGASTRODUODENOSCOPY (EGD);  Surgeon: Arta Silence, MD;  Location: [facility] ENDOSCOPY;  Service: Endoscopy;  Laterality: Left;  . ESOPHAGOGASTRODUODENOSCOPY N/A 09/20/2015   Procedure: ESOPHAGOGASTRODUODENOSCOPY (EGD);  Surgeon: Ladene Artist, MD;  Location: [facility] ENDOSCOPY;  Service: Endoscopy;  Laterality: N/A;  . FOOT SURGERY  Bilateral 2020   Taylor's foot  . [place]  . NASAL FRACTURE SURGERY  1974  . TEE WITHOUT CARDIOVERSION N/A 01/17/2015   Procedure: TRANSESOPHAGEAL ECHOCARDIOGRAM (TEE);  Surgeon: Sanda Klein, MD;  Location: [facility] ENDOSCOPY;  Service: Cardiovascular;  Laterality: N/A;  . WISDOM TOOTH EXTRACTION      There were no vitals filed for this visit.   Subjective Assessment - 06/09/20 1023    Subjective Pt reports no increased pain following last session. 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    Limitations Standing;Walking;House hold activities;Sitting;Lifting    How long can you walk comfortably? a block or 2    Diagnostic tests XR Lumbar spine: "significant shift of the vertebrae almost 3.3 cm forward"    Patient Stated Goals To increase stamina and activity level again    Currently in Pain? No/denies                             [facility] Adult PT Treatment/Exercise - 06/09/20 0001      Lumbar Exercises: Stretches   Single Knee to Chest Stretch Right;Left;1 rep;60 seconds    Single Knee to Chest Stretch Limitations OP to thigh    Lower Trunk Rotation 4 reps;10 seconds    Lower Trunk Rotation Limitations after TT holds      Lumbar Exercises: Aerobic   Nustep L7  UE/LE 6'      Lumbar Exercises: Standing   Other Standing Lumbar Exercises pallof press 2x10 each direction green ; chop FM 3-4.5 kg 2x10       Lumbar Exercises: Supine   Pelvic Tilt 10 reps;10 seconds    Dead Bug 10 reps    Dead Bug Limitations legs only    Bridge with [employer] 10 reps;5 seconds    Bridge with March 10 reps    Bridge with [employer] Limitations c ball ADD, slow    Other Supine Lumbar Exercises 90/90 Tabletop holds 5x15"   lift/lower into pos'n 1 LE at a time   Other Supine Lumbar Exercises 90/90 march x 16                    PT Short Term Goals - 06/08/20 7616      PT SHORT TERM GOAL #1   Title Pt will be I and  compliant with initial HEP.    Time 2    Period Weeks    Status On-going    Target Date 05/26/20             PT Long Term Goals - 05/12/20 1643      PT LONG TERM GOAL #1   Title Pt will be I and compliant with long term HEP for maintenance and progression.    Time 6    Period Weeks    Status New    Target Date 06/23/20      PT LONG TERM GOAL #2   Title Pt will demo lumbar ROM WFL and pain free for ADLs and IADLs.    Time 6    Period Weeks    Status New    Target Date 06/23/20      PT LONG TERM GOAL #3   Title Pt will demo double leg lower to 45 without pain for improved lumbar support and core stabilization.    Time 6    Period Weeks    Status New    Target Date 06/23/20      PT LONG TERM GOAL #4   Title Pt will increase abilty to walk for 30 minutes, in order for exercise and weight loss.    Time 6    Period Weeks    Status New    Target Date 06/23/20      PT LONG TERM GOAL #5   Title Pt will be able to stand for 10 minutes to perform errands and household activities.    Time 6    Period Weeks    Status New    Target Date 06/23/20                 Plan - 06/09/20 1049    Clinical Impression Statement Pt presents with improvement in core strength and endurance and has no pain with any exercises. He tolerated chops well with FM today from top and bottom.    Personal Factors and Comorbidities Comorbidity 1;Comorbidity 2;Comorbidity 3+;Past/Current Experience;Time since onset of injury/illness/exacerbation    Comorbidities Arthritis, Stroke, Anemia, HLD, HTN, OSA    Examination-Activity Limitations Locomotion Level;Sit;Stand;Squat;Lift;Bend    Examination-Participation Restrictions Occupation;Shop;Community Activity    Stability/Clinical Decision Making Evolving/Moderate complexity    Rehab Potential Good    PT Frequency 2x / week    PT Duration 6 weeks    PT Treatment/Interventions ADLs/Self Care Home Management;Moist Heat;Iontophoresis 4mg /ml  Dexamethasone;Traction;Electrical Stimulation;Cryotherapy;Neuromuscular re-education;Balance training;Therapeutic exercise;Therapeutic activities;Functional mobility training;Spinal Manipulations;Joint Manipulations;Dry needling;Passive range  of motion;Patient/family education;Manual techniques    PT Next Visit Plan update HEP PRN, progress core stab, lumbopelvic posture and rhythm    PT Home Exercise Plan SKTC, DKTC, Figure 4 S, Pirif S, PPT    Consulted and Agree with Plan of Care Patient           Patient will benefit from skilled therapeutic intervention in order to improve the following deficits and impairments:  Pain, Improper body mechanics, Impaired flexibility, Hypomobility, Postural dysfunction, Decreased mobility, Decreased strength, Decreased activity tolerance, Impaired perceived functional ability, Decreased range of motion, Increased fascial restricitons  Visit Diagnosis: Spondylolisthesis of lumbar region  Chronic bilateral low back pain with bilateral sciatica  Abnormal posture  Muscle weakness (generalized)     Problem List Patient Active Problem List   Diagnosis Date Noted  . Spondylolisthesis of lumbosacral region 05/03/2020  . CKD (chronic kidney disease) stage 3, GFR 30-59 ml/min 09/26/2017  . Syncope 09/26/2017  . Elevated transaminase level 07/25/2017  . Cannabis abuse 07/23/2017  . Uncomplicated alcohol dependence ([place]) 07/23/2017  . Melena   . Gastric ulcer with hemorrhage   . Right hemiparesis ([place]) 09/18/2015  . Chronic blood loss anemia 09/18/2015  . Hematemesis 09/18/2015  . Wellness examination 06/29/2015  . History of CEA (carotid endarterectomy) 03/25/2015  . HLD (hyperlipidemia) 03/25/2015  . Tobacco use disorder 03/25/2015  . Carotid artery occlusion with infarction ([place]) 03/25/2015  . Carotid aneurysm, right ([place]) 01/19/2015  . Stroke ([place])   . Aortic arch atherosclerosis ([place])   . Carotid stenosis 01/16/2015  . Thrombus: BILATERAL  EXTERNAL JUGULAR VEINS per CT angio head and neck 01/16/15 01/16/2015  . DVT (deep venous thrombosis) ([place])   . Numbness and tingling of left arm and leg 01/15/2015  . CVA (cerebral infarction)   . Paresthesia 12/08/2014  . 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 ADD (attention deficit disorder) 07/14/2013  . Nicotine dependence 06/18/2013  . Back pain, chronic 06/17/2013  . ACUTE GASTRIC ULCER W/HEMORRHAGE AND OBSTRUCTION 02/28/2010  . PERSONAL HX COLONIC POLYPS 02/28/2010  . DIVERTICULOSIS, COLON 02/02/2010  . ANEMIA, SEVERE 01/13/2010  . Essential hypertension 01/13/2010  . Sleep apnea 01/13/2010  . Left carotid bruit 01/13/2010  . MIXED HEARING LOSS BILATERAL 02/16/2009    Izell Franklinton, PT, DPT 06/09/2020, 1:06 PM  [facility] [address] [place], [place], [address] Phone: [phone]   Fax:  [phone]  Name: Martin Archer MRN: [id number] Date of Birth: [birth year]

## 2020-06-14 ENCOUNTER — Ambulatory Visit: Payer: Medicare Other

## 2020-06-16 ENCOUNTER — Other Ambulatory Visit: Payer: Self-pay

## 2020-06-16 ENCOUNTER — Ambulatory Visit: Payer: Medicare Other

## 2020-06-16 DIAGNOSIS — R293 Abnormal posture: Secondary | ICD-10-CM

## 2020-06-16 DIAGNOSIS — M5442 Lumbago with sciatica, left side: Secondary | ICD-10-CM

## 2020-06-16 DIAGNOSIS — M6281 Muscle weakness (generalized): Secondary | ICD-10-CM

## 2020-06-16 DIAGNOSIS — M4316 Spondylolisthesis, lumbar region: Secondary | ICD-10-CM

## 2020-06-16 DIAGNOSIS — G8929 Other chronic pain: Secondary | ICD-10-CM

## 2020-06-16 NOTE — Therapy (Addendum)
[facility] Outpatient Rehabilitation [facility] [address] [place], [place], [address] Phone: [phone]   Fax:  [phone]  Physical Therapy Treatment/Discharge   Patient Details  Name: Martin Archer MRN: [id number] Date of Birth: 1961/04/19 Referring Provider (PT): Clementeen Graham, MD   Encounter Date: 06/16/2020   PT End of Session - 06/16/20 0936    Visit Number 6    Number of Visits 12    Date for PT Re-Evaluation 06/23/20    Authorization Type Medicare    PT Start Time 0931    PT Stop Time 1013    PT Time Calculation (min) 42 min    Activity Tolerance Patient tolerated treatment well    Behavior During Therapy [facility] for tasks assessed/performed           Past Medical History:  Diagnosis Date  . Anemia, unspecified   . Arthritis   . Diverticulosis of colon (without mention of hemorrhage)   . Gastric ulcer with hemorrhage and obstruction 2011   Dr Julien Girt [facility]  . GERD (gastroesophageal reflux disease)    hx  . Hyperlipemia   . Hypertension   . Personal history of colonic polyps 01/30/2010   hyperplastic rectal  . Sleep apnea    on CPAP; [facility]  . Stroke [facility])    mini strokes-bilateral carotid endarterectomy    Past Surgical History:  Procedure Laterality Date  . CAROTID ENDARTERECTOMY  11/2006   left Dr Jerilee Field  . COLONOSCOPY  2011   DP-positive FOBT/IDA  . ENDARTERECTOMY Right 01/19/2015   Procedure: RIGHT CAROTID ENDARTERECTOMY WITH PATCH ANGIOPLASTY;  Surgeon: Pryor Ochoa, MD;  Location: [facility] OR;  Service: Vascular;  Laterality: Right;  . ESOPHAGOGASTRODUODENOSCOPY Left 06/17/2013   Procedure: ESOPHAGOGASTRODUODENOSCOPY (EGD);  Surgeon: Willis Modena, MD;  Location: [facility] ENDOSCOPY;  Service: Endoscopy;  Laterality: Left;  . ESOPHAGOGASTRODUODENOSCOPY N/A 09/20/2015   Procedure: ESOPHAGOGASTRODUODENOSCOPY (EGD);  Surgeon: Meryl Dare, MD;  Location: [facility] ENDOSCOPY;  Service: Endoscopy;  Laterality: N/A;  .  FOOT SURGERY Bilateral 2020   Taylor's foot  . INGUINAL HERNIA REPAIR  1963  . NASAL FRACTURE SURGERY  1974  . TEE WITHOUT CARDIOVERSION N/A 01/17/2015   Procedure: TRANSESOPHAGEAL ECHOCARDIOGRAM (TEE);  Surgeon: Thurmon Fair, MD;  Location: [facility] ENDOSCOPY;  Service: Cardiovascular;  Laterality: N/A;  . WISDOM TOOTH EXTRACTION      There were no vitals filed for this visit.   Subjective Assessment - 06/16/20 0935    Subjective Pt reports his back has been feeling ok.    Limitations Standing;Walking;House hold activities;Sitting;Lifting    How long can you walk comfortably? a block or 2    Diagnostic tests XR Lumbar spine: "significant shift of the vertebrae almost 3.3 cm forward"    Patient Stated Goals To increase stamina and activity level again    Currently in Pain? No/denies              [facility] PT Assessment - 06/16/20 0001      Observation/Other Assessments   Focus on Therapeutic Outcomes (FOTO)  59% ability                         [facility] Adult PT Treatment/Exercise - 06/16/20 0001      Lumbar Exercises: Stretches   Single Knee to Chest Stretch Right;Left;1 rep;60 seconds    Single Knee to Chest Stretch Limitations OP to thigh    Figure 4 Stretch 1 rep;30 seconds    Figure  4 Stretch Limitations pulling thighs toward chest      Lumbar Exercises: Aerobic   Recumbent Bike L6 5 min      Lumbar Exercises: Supine   Pelvic Tilt 10 reps;10 seconds      Lumbar Exercises: Quadruped   Madcat/Old Horse 10 reps    Madcat/Old Horse Limitations + child's pose    Opposite Arm/Leg Raise Limitations legs only x 10 ea    Plank --                    PT Short Term Goals - 06/08/20 2423      PT SHORT TERM GOAL #1   Title Pt will be I and compliant with initial HEP.    Time 2    Period Weeks    Status On-going    Target Date 05/26/20             PT Long Term Goals - 05/12/20 1643      PT LONG TERM GOAL #1   Title Pt will be I and compliant with  long term HEP for maintenance and progression.    Time 6    Period Weeks    Status New    Target Date 06/23/20      PT LONG TERM GOAL #2   Title Pt will demo lumbar ROM WFL and pain free for ADLs and IADLs.    Time 6    Period Weeks    Status New    Target Date 06/23/20      PT LONG TERM GOAL #3   Title Pt will demo double leg lower to 45 without pain for improved lumbar support and core stabilization.    Time 6    Period Weeks    Status New    Target Date 06/23/20      PT LONG TERM GOAL #4   Title Pt will increase abilty to walk for 30 minutes, in order for exercise and weight loss.    Time 6    Period Weeks    Status New    Target Date 06/23/20      PT LONG TERM GOAL #5   Title Pt will be able to stand for 10 minutes to perform errands and household activities.    Time 6    Period Weeks    Status New    Target Date 06/23/20                 Plan - 06/16/20 5361    Clinical Impression Statement Pt was agitated today at beginning of session, but did participate and review FOTO + HEP. Advised to try HEP on his own as well as walking program and return if needed in 3-4 weeks.    Personal Factors and Comorbidities Comorbidity 1;Comorbidity 2;Comorbidity 3+;Past/Current Experience;Time since onset of injury/illness/exacerbation    Comorbidities Arthritis, Stroke, Anemia, HLD, HTN, OSA    Examination-Activity Limitations Locomotion Level;Sit;Stand;Squat;Lift;Bend    Examination-Participation Restrictions Occupation;Shop;Community Activity    Stability/Clinical Decision Making Evolving/Moderate complexity    Rehab Potential Good    PT Frequency 2x / week    PT Duration 6 weeks    PT Treatment/Interventions ADLs/Self Care Home Management;Moist Heat;Iontophoresis 4mg /ml Dexamethasone;Traction;Electrical Stimulation;Cryotherapy;Neuromuscular re-education;Balance training;Therapeutic exercise;Therapeutic activities;Functional mobility training;Spinal Manipulations;Joint  Manipulations;Dry needling;Passive range of motion;Patient/family education;Manual techniques    PT Next Visit Plan Hold chart 30 d, likely [place], DKTC, Figure 4 S, Pirif S, PPT  Consulted and Agree with Plan of Care Patient           Patient will benefit from skilled therapeutic intervention in order to improve the following deficits and impairments:  Pain, Improper body mechanics, Impaired flexibility, Hypomobility, Postural dysfunction, Decreased mobility, Decreased strength, Decreased activity tolerance, Impaired perceived functional ability, Decreased range of motion, Increased fascial restricitons  Visit Diagnosis: Spondylolisthesis of lumbar region  Chronic bilateral low back pain with bilateral sciatica  Abnormal posture  Muscle weakness (generalized)  PHYSICAL THERAPY DISCHARGE SUMMARY  Visits from Start of Care: 6  Current functional level related to goals / functional outcomes: Had good program to work on at home. Had intermittent pain   Remaining deficits: Pain at times   Education / Equipment: HEP   Plan: Patient agrees to discharge.  Patient goals were partially met. Patient is being discharged due to being pleased with the current functional level.  ?????       Problem List Patient Active Problem List   Diagnosis Date Noted  . Spondylolisthesis of lumbosacral region 05/03/2020  . CKD (chronic kidney disease) stage 3, GFR 30-59 ml/min 09/26/2017  . Syncope 09/26/2017  . Elevated transaminase level 07/25/2017  . Cannabis abuse 07/23/2017  . Uncomplicated alcohol dependence ([place]) 07/23/2017  . Melena   . Gastric ulcer with hemorrhage   . Right hemiparesis ([place]) 09/18/2015  . Chronic blood loss anemia 09/18/2015  . Hematemesis 09/18/2015  . Wellness examination 06/29/2015  . History of CEA (carotid endarterectomy) 03/25/2015  . HLD (hyperlipidemia) 03/25/2015  . Tobacco use disorder 03/25/2015  . Carotid artery occlusion  with infarction ([place]) 03/25/2015  . Carotid aneurysm, right ([place]) 01/19/2015  . Stroke ([place])   . Aortic arch atherosclerosis ([place])   . Carotid stenosis 01/16/2015  . Thrombus: BILATERAL EXTERNAL JUGULAR VEINS per CT angio head and neck 01/16/15 01/16/2015  . DVT (deep venous thrombosis) ([place])   . Numbness and tingling of left arm and leg 01/15/2015  . CVA (cerebral infarction)   . Paresthesia 12/08/2014  . 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 ADD (attention deficit disorder) 07/14/2013  . Nicotine dependence 06/18/2013  . Back pain, chronic 06/17/2013  . ACUTE GASTRIC ULCER W/HEMORRHAGE AND OBSTRUCTION 02/28/2010  . PERSONAL HX COLONIC POLYPS 02/28/2010  . DIVERTICULOSIS, COLON 02/02/2010  . ANEMIA, SEVERE 01/13/2010  . Essential hypertension 01/13/2010  . Sleep apnea 01/13/2010  . Left carotid bruit 01/13/2010  . MIXED HEARING LOSS BILATERAL 02/16/2009    Izell [place], PT, DPT 06/16/2020, 10:45 AM  [facility] [address] [place], [place], [address] Phone: [phone]   Fax:  [phone]  Name: Ezequias Lard MRN: [id number] Date of Birth: 09/18/1960

## 2020-06-21 ENCOUNTER — Ambulatory Visit: Payer: Medicare Other

## 2020-06-23 ENCOUNTER — Ambulatory Visit: Payer: Medicare Other

## 2020-10-19 ENCOUNTER — Other Ambulatory Visit (HOSPITAL_BASED_OUTPATIENT_CLINIC_OR_DEPARTMENT_OTHER): Payer: Self-pay

## 2020-10-19 DIAGNOSIS — G4733 Obstructive sleep apnea (adult) (pediatric): Secondary | ICD-10-CM

## 2020-11-14 ENCOUNTER — Other Ambulatory Visit: Payer: Self-pay

## 2020-11-14 ENCOUNTER — Ambulatory Visit (HOSPITAL_BASED_OUTPATIENT_CLINIC_OR_DEPARTMENT_OTHER): Payer: Medicare Other | Attending: Otolaryngology | Admitting: Internal Medicine

## 2020-11-14 VITALS — Ht 67.0 in | Wt 200.0 lb

## 2020-11-14 DIAGNOSIS — G4733 Obstructive sleep apnea (adult) (pediatric): Secondary | ICD-10-CM

## 2020-11-14 DIAGNOSIS — R0902 Hypoxemia: Secondary | ICD-10-CM | POA: Insufficient documentation

## 2020-11-14 DIAGNOSIS — G4736 Sleep related hypoventilation in conditions classified elsewhere: Secondary | ICD-10-CM | POA: Insufficient documentation

## 2020-11-26 NOTE — Procedures (Signed)
    Patient Name: Martin Archer, Martin Archer Date: 11/14/2020 Gender: Male D.O.B: [birth year] Age (years): 59 Referring Provider: Melida Quitter Height (inches): 37 Interpreting Physician: Baird Lyons MD, ABSM Weight (lbs): 212 RPSGT: Jacolyn Reedy BMI: 33 MRN: [id number] Neck Size: 17.00  CLINICAL INFORMATION Sleep Study Type: HST Indication for sleep study: OSA Epworth Sleepiness Score: 8  SLEEP STUDY TECHNIQUE A multi-channel overnight portable sleep study was performed. The channels recorded were: nasal airflow, thoracic respiratory movement, and oxygen saturation with a pulse oximetry. Snoring was also monitored.  MEDICATIONS Patient self administered medications include: none reported.  SLEEP ARCHITECTURE Patient was studied for 402.5 minutes. The sleep efficiency was 100.0 % and the patient was supine for 27.4%. The arousal index was 0.0 per hour.  RESPIRATORY PARAMETERS The overall AHI was 54.7 per hour, with a central apnea index of 0 per hour.  The oxygen nadir was 75% during sleep.  CARDIAC DATA Mean heart rate during sleep was 98.8 bpm.  IMPRESSIONS - Severe obstructive sleep apnea occurred during this study (AHI = 54.7/h). - Oxygen desaturation was noted during this study (Min O2 = 75%). Mean O2 saturation 88%. Time with O2 saturation 89% or less was 176 minutes. - Patient snored.  DIAGNOSIS - Obstructive Sleep Apnea (G47.33) - Nocturnal Hypoxemia (G47.36)  RECOMMENDATIONS - Suggest CPAP titration sleep study to meet insurance documentation requirement if supplemental O2 is needed in addition to CPAP. Other options would be based on clinical judgment. - Be careful with alcohol, sedatives and other CNS depressants that may worsen sleep apnea and disrupt normal sleep architecture. - Sleep hygiene should be reviewed to assess factors that may improve sleep quality. - Weight management and regular exercise should be initiated or continued.  [Electronically  signed] 11/26/2020 10:14 AM  Baird Lyons MD, ABSM Diplomate, American Board of Sleep Medicine   NPI: [id number]                           [place]  ELECTRONICALLY SIGNED ON:  11/26/2020, 10:10 AM [place] PH: [phone]   FX: [phone] [place]

## 2020-12-08 ENCOUNTER — Other Ambulatory Visit: Payer: Self-pay | Admitting: Otolaryngology

## 2020-12-12 ENCOUNTER — Other Ambulatory Visit: Payer: Self-pay | Admitting: Internal Medicine

## 2020-12-12 NOTE — Telephone Encounter (Signed)
Please refill as per office routine med refill policy (all routine meds refilled for 3 mo or monthly per pt preference up to one year from last visit, then month to month grace period for 3 mo, then further med refills will have to be denied)  

## 2020-12-13 ENCOUNTER — Ambulatory Visit: Payer: Medicare Other | Admitting: Internal Medicine

## 2020-12-19 ENCOUNTER — Encounter: Payer: Self-pay | Admitting: Internal Medicine

## 2020-12-19 ENCOUNTER — Ambulatory Visit (INDEPENDENT_AMBULATORY_CARE_PROVIDER_SITE_OTHER): Payer: Medicare Other | Admitting: Internal Medicine

## 2020-12-19 ENCOUNTER — Other Ambulatory Visit: Payer: Self-pay | Admitting: Internal Medicine

## 2020-12-19 ENCOUNTER — Other Ambulatory Visit: Payer: Self-pay

## 2020-12-19 ENCOUNTER — Ambulatory Visit (INDEPENDENT_AMBULATORY_CARE_PROVIDER_SITE_OTHER): Payer: Medicare Other

## 2020-12-19 VITALS — BP 112/96 | HR 83 | Temp 98.1°F | Ht 67.0 in | Wt 203.8 lb

## 2020-12-19 DIAGNOSIS — E785 Hyperlipidemia, unspecified: Secondary | ICD-10-CM | POA: Diagnosis not present

## 2020-12-19 DIAGNOSIS — N1831 Chronic kidney disease, stage 3a: Secondary | ICD-10-CM

## 2020-12-19 DIAGNOSIS — E538 Deficiency of other specified B group vitamins: Secondary | ICD-10-CM | POA: Diagnosis not present

## 2020-12-19 DIAGNOSIS — N320 Bladder-neck obstruction: Secondary | ICD-10-CM

## 2020-12-19 DIAGNOSIS — R5383 Other fatigue: Secondary | ICD-10-CM | POA: Diagnosis not present

## 2020-12-19 DIAGNOSIS — I313 Pericardial effusion (noninflammatory): Secondary | ICD-10-CM

## 2020-12-19 DIAGNOSIS — R0600 Dyspnea, unspecified: Secondary | ICD-10-CM | POA: Diagnosis not present

## 2020-12-19 DIAGNOSIS — I3139 Other pericardial effusion (noninflammatory): Secondary | ICD-10-CM

## 2020-12-19 DIAGNOSIS — R739 Hyperglycemia, unspecified: Secondary | ICD-10-CM | POA: Diagnosis not present

## 2020-12-19 DIAGNOSIS — I10 Essential (primary) hypertension: Secondary | ICD-10-CM | POA: Diagnosis not present

## 2020-12-19 DIAGNOSIS — E559 Vitamin D deficiency, unspecified: Secondary | ICD-10-CM

## 2020-12-19 LAB — LIPID PANEL
Cholesterol: 157 mg/dL (ref 0–200)
HDL: 37.4 mg/dL — ABNORMAL LOW (ref >39.00)
LDL Cholesterol: 88 mg/dL (ref 0–99)
NonHDL: 119.85
Total CHOL/HDL Ratio: 4
Triglycerides: 161 mg/dL — ABNORMAL HIGH (ref 0.0–149.0)
VLDL: 32.2 mg/dL (ref 0.0–40.0)

## 2020-12-19 LAB — CBC WITH DIFFERENTIAL/PLATELET
Basophils Absolute: 0.1 10*3/uL (ref 0.0–0.1)
Basophils Relative: 0.8 % (ref 0.0–3.0)
Eosinophils Absolute: 0.2 10*3/uL (ref 0.0–0.7)
Eosinophils Relative: 1.4 % (ref 0.0–5.0)
HCT: 43.2 % (ref 39.0–52.0)
Hemoglobin: 14.2 g/dL (ref 13.0–17.0)
Lymphocytes Relative: 17.9 % (ref 12.0–46.0)
Lymphs Abs: 2.4 10*3/uL (ref 0.7–4.0)
MCHC: 32.9 g/dL (ref 30.0–36.0)
MCV: 88.5 fl (ref 78.0–100.0)
Monocytes Absolute: 1.4 10*3/uL — ABNORMAL HIGH (ref 0.1–1.0)
Monocytes Relative: 9.9 % (ref 3.0–12.0)
Neutro Abs: 9.5 10*3/uL — ABNORMAL HIGH (ref 1.4–7.7)
Neutrophils Relative %: 70 % (ref 43.0–77.0)
Platelets: 305 10*3/uL (ref 150.0–400.0)
RBC: 4.88 Mil/uL (ref 4.22–5.81)
RDW: 16.7 % — ABNORMAL HIGH (ref 11.5–15.5)
WBC: 13.6 10*3/uL — ABNORMAL HIGH (ref 4.0–10.5)

## 2020-12-19 LAB — URINALYSIS, ROUTINE W REFLEX MICROSCOPIC
Bilirubin Urine: NEGATIVE
Hgb urine dipstick: NEGATIVE
Ketones, ur: NEGATIVE
Leukocytes,Ua: NEGATIVE
Nitrite: NEGATIVE
RBC / HPF: NONE SEEN (ref 0–?)
Specific Gravity, Urine: >=1.03 — AB (ref 1.000–1.030)
Total Protein, Urine: 100 — AB
Urine Glucose: NEGATIVE
Urobilinogen, UA: 0.2 (ref 0.0–1.0)
pH: 5.5 (ref 5.0–8.0)

## 2020-12-19 LAB — BASIC METABOLIC PANEL
BUN: 29 mg/dL — ABNORMAL HIGH (ref 6–23)
CO2: 26 mEq/L (ref 19–32)
Calcium: 9.1 mg/dL (ref 8.4–10.5)
Chloride: 105 mEq/L (ref 96–112)
Creatinine, Ser: 1.91 mg/dL — ABNORMAL HIGH (ref 0.40–1.50)
GFR: 37.26 mL/min — ABNORMAL LOW (ref >60.00)
Glucose, Bld: 83 mg/dL (ref 70–99)
Potassium: 5.5 mEq/L — ABNORMAL HIGH (ref 3.5–5.1)
Sodium: 138 mEq/L (ref 135–145)

## 2020-12-19 LAB — VITAMIN D 25 HYDROXY (VIT D DEFICIENCY, FRACTURES): VITD: 28.43 ng/mL — ABNORMAL LOW (ref 30.00–100.00)

## 2020-12-19 LAB — HEPATIC FUNCTION PANEL
ALT: 34 U/L (ref 0–53)
AST: 20 U/L (ref 0–37)
Albumin: 3.9 g/dL (ref 3.5–5.2)
Alkaline Phosphatase: 113 U/L (ref 39–117)
Bilirubin, Direct: 0.1 mg/dL (ref 0.0–0.3)
Total Bilirubin: 0.6 mg/dL (ref 0.2–1.2)
Total Protein: 6.5 g/dL (ref 6.0–8.3)

## 2020-12-19 LAB — TSH: TSH: 1.76 u[IU]/mL (ref 0.35–4.50)

## 2020-12-19 LAB — PSA: PSA: 0.6 ng/mL (ref 0.10–4.00)

## 2020-12-19 LAB — VITAMIN B12: Vitamin B-12: 437 pg/mL (ref 211–911)

## 2020-12-19 LAB — HEMOGLOBIN A1C: Hgb A1c MFr Bld: 5.5 % (ref 4.6–6.5)

## 2020-12-19 MED ORDER — AMLODIPINE BESYLATE 5 MG PO TABS: 5.0000 mg | ORAL_TABLET | Freq: Every day | ORAL | 3 refills | Status: DC

## 2020-12-19 MED ORDER — LISINOPRIL 20 MG PO TABS: 20.0000 mg | ORAL_TABLET | Freq: Every day | ORAL | 3 refills | Status: DC

## 2020-12-19 NOTE — Progress Notes (Signed)
Patient ID: Martin Archer, male   DOB: [birth year], 60 y.o.   MRN: [id number]         Chief Complaint:: yearly exam and Office Visit (Patient c/o BP medication not "helping")         HPI:  Martin Archer is a 60 y.o. male here with recent labile BP, up to sbp 200 this am, then 130 later this AM just after taking med, but has been feeling dizzy and fatigue and wants to change BP med.  Pt denies chest pain, wheezing, orthopnea, PND, increased LE swelling, palpitations, dizziness or syncope, but has intermittent mild sob for unclear reasons, nonpositional and non exertional at times.  Does c/o ongoing fatigue, but denies signficant daytime hypersomnolence. May have had the covid infection in fall 2019.   Quit smoking.    Pt denies fever, wt loss, night sweats, loss of appetite, or other constitutional symptoms   Pt denies polydipsia, polyuria, or new focal neuro s/s.  No other new complaints.      Wt Readings from Last 3 Encounters:  12/19/20 203 lb 12.8 oz (92.4 kg)  11/14/20 200 lb (90.7 kg)  06/02/20 210 lb (95.3 kg)   BP Readings from Last 3 Encounters:  12/19/20 (!) 112/96  06/02/20 140/90  05/24/20 (!) 172/130   Immunization History  Administered Date(s) Administered  . Influenza Whole 06/28/2010  . Influenza,inj,Quad PF,6+ Mos 07/25/2017, 05/20/2019  . Moderna Sars-Covid-2 Vaccination 12/25/2019, 01/22/2020, 06/20/2020  . Pneumococcal Polysaccharide-23 08/01/2017  . Td 08/27/2001, 10/02/2013   There are no preventive care reminders to display for this patient.    Past Medical History:  Diagnosis Date  . Anemia, unspecified   . Arthritis   . Diverticulosis of colon (without mention of hemorrhage)   . Gastric ulcer with hemorrhage and obstruction 2011   Dr Fidela [facility]  . GERD (gastroesophageal reflux disease)    hx  . Hyperlipemia   . Hypertension   . Personal history of colonic polyps 01/30/2010   hyperplastic rectal  . Sleep apnea    on CPAP; [facility]  . Stroke [facility])    mini strokes-bilateral carotid endarterectomy   Past Surgical History:  Procedure Laterality Date  . CAROTID ENDARTERECTOMY  11/2006   left Dr Mamie Nick  . COLONOSCOPY  2011   DP-positive FOBT/IDA  . ENDARTERECTOMY Right 01/19/2015   Procedure: RIGHT CAROTID ENDARTERECTOMY WITH PATCH ANGIOPLASTY;  Surgeon: Mal Misty, MD;  Location: [place];  Service: Vascular;  Laterality: Right;  . ESOPHAGOGASTRODUODENOSCOPY Left 06/17/2013   Procedure: ESOPHAGOGASTRODUODENOSCOPY (EGD);  Surgeon: Arta Silence, MD;  Location: [facility] ENDOSCOPY;  Service: Endoscopy;  Laterality: Left;  . ESOPHAGOGASTRODUODENOSCOPY N/A 09/20/2015   Procedure: ESOPHAGOGASTRODUODENOSCOPY (EGD);  Surgeon: Ladene Artist, MD;  Location: [facility] ENDOSCOPY;  Service: Endoscopy;  Laterality: N/A;  . FOOT SURGERY Bilateral 2020   Taylor's foot  . [place]  . NASAL FRACTURE SURGERY  1974  . TEE WITHOUT CARDIOVERSION N/A 01/17/2015   Procedure: TRANSESOPHAGEAL ECHOCARDIOGRAM (TEE);  Surgeon: Sanda Klein, MD;  Location: [facility] ENDOSCOPY;  Service: Cardiovascular;  Laterality: N/A;  . WISDOM TOOTH EXTRACTION      reports that he quit smoking about 5 years ago. His smoking use included cigarettes. He smoked 0.50 packs per day. He has never used smokeless tobacco. He reports previous alcohol use. He reports that he does not use drugs. family history includes Cancer in his maternal grandfather; Diabetes in his father and paternal grandfather; Heart attack  in his mother; Heart attack (age of onset: 52) in his brother; Heart attack (age of onset: 42) in his maternal grandfather; Hypertension in his father; Stroke in his father and paternal uncle. Allergies  Allergen Reactions  . Adderall [Amphetamine-Dextroamphet Er] Diarrhea  . Prozac [Fluoxetine Hcl] Diarrhea  . Sertraline Hcl Diarrhea  . Other Diarrhea    Advil, ibuprofen, tylenol, (excedrin is OK)   Current Outpatient Medications on  File Prior to Visit  Medication Sig Dispense Refill  . acetaminophen (TYLENOL) 325 MG tablet Take 650 mg by mouth every 6 (six) hours as needed.    Marland Kitchen atorvastatin (LIPITOR) 80 MG tablet TAKE 1 TABLET BY MOUTH DAILY 90 tablet 3   No current facility-administered medications on file prior to visit.        ROS:  All others reviewed and negative.  Objective        PE:  BP (!) 112/96 (BP Location: Left Arm, Patient Position: Sitting, Cuff Size: Large)   Pulse 83   Temp 98.1 F (36.7 C) (Oral)   Ht '5\' 7"'$  (1.702 m)   Wt 203 lb 12.8 oz (92.4 kg)   SpO2 95%   BMI 31.92 kg/m                 Constitutional: Pt appears in NAD               HENT: Head: NCAT.                Right Ear: External ear normal.                 Left Ear: External ear normal.                Eyes: . Pupils are equal, round, and reactive to light. Conjunctivae and EOM are normal               Nose: without d/c or deformity               Neck: Neck supple. Gross normal ROM               Cardiovascular: Normal rate and regular rhythm.                 Pulmonary/Chest: Effort normal and breath sounds without rales or wheezing.                Abd:  Soft, NT, ND, + BS, no organomegaly               Neurological: Pt is alert. At baseline orientation, motor grossly intact               Skin: Skin is warm. No rashes, no other new lesions, LE edema - none               Psychiatric: Pt behavior is normal without agitation   Micro: none  Cardiac tracings I have personally interpreted today:  none  Pertinent Radiological findings (summarize): none   Lab Results  Component Value Date   WBC 13.6 (H) 12/19/2020   HGB 14.2 12/19/2020   HCT 43.2 12/19/2020   PLT 305.0 12/19/2020   GLUCOSE 83 12/19/2020   CHOL 157 12/19/2020   TRIG 161.0 (H) 12/19/2020   HDL 37.40 (L) 12/19/2020   LDLDIRECT 90.0 09/26/2017   LDLCALC 88 12/19/2020   ALT 34 12/19/2020   AST 20 12/19/2020   NA 138 12/19/2020   K 5.5 No hemolysis seen (H)  12/19/2020   CL 105 12/19/2020   CREATININE 1.91 (H) 12/19/2020   BUN 29 (H) 12/19/2020   CO2 26 12/19/2020   TSH 1.76 12/19/2020   PSA 0.60 12/19/2020   INR 1.07 09/18/2015   HGBA1C 5.5 12/19/2020   MICROALBUR 0.8 07/15/2006   Assessment/Plan:  Martin Archer is a 60 y.o. White or Caucasian [1] male with  has a past medical history of Anemia, unspecified, Arthritis, Diverticulosis of colon (without mention of hemorrhage), Gastric ulcer with hemorrhage and obstruction (2011), GERD (gastroesophageal reflux disease), Hyperlipemia, Hypertension, Personal history of colonic polyps (01/30/2010), Sleep apnea, and Stroke ([place]).  HLD (hyperlipidemia) Lab Results  Component Value Date   LDLCALC 88 12/19/2020   Stable, pt to continue current statin lipitor 80   Fatigue Etiology unclear, Exam otherwise benign, to check labs as documented, follow with expectant management   Essential hypertension Difficult to control, ok for d/c metoprolol, to increase lisinopril 20 qd and f/u BP at home and next visit  Dyspnea Exam benign, etiology unclear, for cxr  CKD (chronic kidney disease) stage 3, GFR 30-59 ml/min Lab Results  Component Value Date   CREATININE 1.91 (H) 12/19/2020   Stable overall, cont to avoid nephrotoxins   Followup: Return in about 6 months (around 06/20/2021).  [name], MD 12/27/2020 8:48 PM [facility]

## 2020-12-19 NOTE — Patient Instructions (Signed)
Ok to stop the metoprolol  Ok to increase the lisinopril to 20 mg per day  Please continue all other medications as before, and refills have been done if requested.  Please have the pharmacy call with any other refills you may need.  Please continue your efforts at being more active, low cholesterol diet, and weight control.  You are otherwise up to date with prevention measures today.  Please keep your appointments with your specialists as you may have planned  Please go to the XRAY Department in the first floor for the x-ray testing  Please go to the LAB at the blood drawing area for the tests to be done  Please remember to sign up for MyChart if you have not done so, as this will be important to you in the future with finding out test results, communicating by private email, and scheduling acute appointments online when needed.  Please make an Appointment to return in 6 months, or sooner if needed

## 2020-12-20 ENCOUNTER — Encounter (HOSPITAL_COMMUNITY): Payer: Self-pay | Admitting: *Deleted

## 2020-12-20 ENCOUNTER — Encounter: Payer: Self-pay | Admitting: Internal Medicine

## 2020-12-20 ENCOUNTER — Ambulatory Visit (HOSPITAL_COMMUNITY): Payer: Medicare Other | Attending: Cardiology

## 2020-12-20 ENCOUNTER — Telehealth (HOSPITAL_COMMUNITY): Payer: Self-pay | Admitting: *Deleted

## 2020-12-20 DIAGNOSIS — R0600 Dyspnea, unspecified: Secondary | ICD-10-CM | POA: Diagnosis present

## 2020-12-20 DIAGNOSIS — I429 Cardiomyopathy, unspecified: Secondary | ICD-10-CM

## 2020-12-20 DIAGNOSIS — I313 Pericardial effusion (noninflammatory): Secondary | ICD-10-CM | POA: Diagnosis present

## 2020-12-20 DIAGNOSIS — I3139 Other pericardial effusion (noninflammatory): Secondary | ICD-10-CM

## 2020-12-20 LAB — ECHOCARDIOGRAM COMPLETE
Area-P 1/2: 5.58 cm2
MV M vel: 5.84 m/s
MV Peak grad: 136.4 mmHg
S' Lateral: 5.1 cm

## 2020-12-20 MED ORDER — PERFLUTREN LIPID MICROSPHERE
1.0000 mL | INTRAVENOUS | Status: AC | PRN
Start: 2020-12-20 — End: 2020-12-20
  Administered 2020-12-20: 3 mL via INTRAVENOUS

## 2020-12-20 NOTE — Telephone Encounter (Signed)
Patient Seen By Dr. Quentin Ore.  Needs General Cardiology Consult ASAP.  Deliah Boston, RDCS

## 2020-12-20 NOTE — Progress Notes (Unsigned)
Patient ID: Martin Archer, male   DOB: 12-07-1960, 60 y.o.   MRN: [id number]   Urgent findings reported to Dr. Quentin Ore (DOD Urgent findings reported time *10:40 Urgent findings acknowledge *Yes Disposition of patient at discharge (if patient is discharged) *Stable, no complaints of pain or SOB.  Discharged to checkout to make appointment with cardiology ASAP.

## 2020-12-27 ENCOUNTER — Encounter: Payer: Self-pay | Admitting: Internal Medicine

## 2020-12-27 NOTE — Assessment & Plan Note (Signed)
Etiology unclear, Exam otherwise benign, to check labs as documented, follow with expectant management  

## 2020-12-27 NOTE — Assessment & Plan Note (Signed)
Lab Results  Component Value Date   LDLCALC 88 12/19/2020   Stable, pt to continue current statin lipitor 80

## 2020-12-27 NOTE — Assessment & Plan Note (Signed)
Lab Results  Component Value Date   CREATININE 1.91 (H) 12/19/2020   Stable overall, cont to avoid nephrotoxins

## 2020-12-27 NOTE — Assessment & Plan Note (Signed)
Exam benign, etiology unclear, for cxr 

## 2020-12-27 NOTE — Assessment & Plan Note (Signed)
Difficult to control, ok for d/c metoprolol, to increase lisinopril 20 qd and f/u BP at home and next visit

## 2021-01-04 ENCOUNTER — Ambulatory Visit (HOSPITAL_BASED_OUTPATIENT_CLINIC_OR_DEPARTMENT_OTHER): Admit: 2021-01-04 | Payer: Medicare Other | Admitting: Otolaryngology

## 2021-01-04 ENCOUNTER — Encounter (HOSPITAL_BASED_OUTPATIENT_CLINIC_OR_DEPARTMENT_OTHER): Payer: Self-pay

## 2021-01-04 SURGERY — DRUG INDUCED SLEEP ENDOSCOPY
Anesthesia: General

## 2021-01-11 NOTE — Progress Notes (Signed)
Patient will see cardiologist 01/17/21 and based on new patient work up can be rescheduled for drug induced endoscopy at a later date

## 2021-01-15 NOTE — H&P (View-Only) (Signed)
Cardiology Office Note   Date:  01/17/2021   ID:  Martin Archer, DOB 11-04-1960, MRN [id number]  PCP:  Biagio Borg, MD    No chief complaint on file.    Wt Readings from Last 3 Encounters:  01/17/21 203 lb 9.6 oz (92.4 kg)  12/19/20 203 lb 12.8 oz (92.4 kg)  11/14/20 200 lb (90.7 kg)       History of Present Illness: Martin Archer is a 60 y.o. male who is being seen today for the evaluation of HTN and abnormal echo at the request of Biagio Borg, MD.  Prior h/o anemia, GI bleed, ulcer, CKD.  Lisinopril was increased in early 2022 for BP control.   11/2020 echo showed: "Left ventricular ejection fraction, by estimation, is 40 to 45%. The  left ventricle has mildly decreased function. The left ventricle  demonstrates global hypokinesis. The left ventricular internal cavity size  was moderately dilated. Left ventricular  diastolic parameters are consistent with Grade III diastolic dysfunction  (restrictive). Elevated left ventricular end-diastolic pressure.  2. Right ventricular systolic function is normal. The right ventricular  size is normal. There is mildly elevated pulmonary artery systolic  pressure. The estimated right ventricular systolic pressure is Q000111Q mmHg.  3. Left atrial size was severely dilated.  4. Right atrial size was severely dilated.  5. The mitral valve is normal in structure. Mild mitral valve  regurgitation. No evidence of mitral stenosis.  6. The aortic valve has an indeterminant number of cusps. Aortic valve  regurgitation is not visualized. Mild to moderate aortic valve  sclerosis/calcification is present, without any evidence of aortic  stenosis.  7. Aortic dilatation noted. There is mild dilatation of the aortic root,  measuring 38 mm. There is mild dilatation of the ascending aorta,  measuring 43 mm.  8. The inferior vena cava is normal in size with greater than 50%  respiratory variability, suggesting right atrial  pressure of 3 mmHg.  9. Compared to prior echo, LVF has declined."  He was having fatigue over the past several months.   He thinks he was told his heart was weak in the past. Records show from [id number]: "Acute systolic congestive heart failure with ejection fraction of 40%. Continue lisinopril and metoprolol. Continue low-dose oral Lasix. Seen by cardiology. Follow-up with cardiology at [facility] at discharge. Patient will need further evaluation in the form of a repeat echo and/ or coronary angiogram. Also had elevated troponin which was likely noncardiac."  He was having alcohol abuse issues at that time.   Denies : Dizziness. Leg edema. Nitroglycerin use. Orthopnea. Palpitations. Paroxysmal nocturnal dyspnea. Shortness of breath. Syncope.   H/o PAD with bilateral carotid endarterectomies, 2008, 2015.  He has had some left sided chest pain. Not walking much.   Past Medical History:  Diagnosis Date  . Anemia, unspecified   . Arthritis   . Diverticulosis of colon (without mention of hemorrhage)   . Gastric ulcer with hemorrhage and obstruction 2011   Dr Fidel[facility]  . GERD (gastroesophageal reflux disease)    hx  . Hyperlipemia   . Hypertension   . Personal history of colonic polyps 01/30/2010   hyperplastic rectal  . Sleep apnea    on CPAP; [facility]  . Stroke [facility])    mini strokes-bilateral carotid endarterectomy    Past Surgical History:  Procedure Laterality Date  . CAROTID ENDARTERECTOMY  11/2006   left Dr Mamie Nick  . COLONOSCOPY  2011   DP-positive FOBT/IDA  . ENDARTERECTOMY Right 01/19/2015   Procedure: RIGHT CAROTID ENDARTERECTOMY WITH PATCH ANGIOPLASTY;  Surgeon: Mal Misty, MD;  Location: [facility];  Service: Vascular;  Laterality: Right;  . ESOPHAGOGASTRODUODENOSCOPY Left 06/17/2013   Procedure: ESOPHAGOGASTRODUODENOSCOPY (EGD);  Surgeon: Arta Silence, MD;  Location: [facility] ENDOSCOPY;  Service: Endoscopy;  Laterality: Left;  .  ESOPHAGOGASTRODUODENOSCOPY N/A 09/20/2015   Procedure: ESOPHAGOGASTRODUODENOSCOPY (EGD);  Surgeon: Ladene Artist, MD;  Location: [facility] ENDOSCOPY;  Service: Endoscopy;  Laterality: N/A;  . FOOT SURGERY Bilateral 2020   Taylor's foot  . [place]  . NASAL FRACTURE SURGERY  1974  . TEE WITHOUT CARDIOVERSION N/A 01/17/2015   Procedure: TRANSESOPHAGEAL ECHOCARDIOGRAM (TEE);  Surgeon: Sanda Klein, MD;  Location: [facility] ENDOSCOPY;  Service: Cardiovascular;  Laterality: N/A;  . WISDOM TOOTH EXTRACTION       Current Outpatient Medications  Medication Sig Dispense Refill  . acetaminophen (TYLENOL) 325 MG tablet Take 650 mg by mouth every 6 (six) hours as needed.    Marland Kitchen amLODipine (NORVASC) 5 MG tablet Take 1 tablet (5 mg total) by mouth daily. 90 tablet 3  . atorvastatin (LIPITOR) 80 MG tablet TAKE 1 TABLET BY MOUTH DAILY 90 tablet 3   No current facility-administered medications for this visit.    Allergies:   Adderall [amphetamine-dextroamphet er], Prozac [fluoxetine hcl], Sertraline hcl, and Other    Social History:  The patient  reports that he quit smoking about 5 years ago. His smoking use included cigarettes. He smoked 0.50 packs per day. He has never used smokeless tobacco. He reports previous alcohol use. He reports that he does not use drugs.   Family History:  The patient's family history includes Cancer in his maternal grandfather; Diabetes in his father and paternal grandfather; Heart attack in his mother; Heart attack (age of onset: 15) in his brother; Heart attack (age of onset: 11) in his maternal grandfather; Hypertension in his father; Stroke in his father and paternal uncle.    ROS:  Please see the history of present illness.   Otherwise, review of systems are positive for some anxiety with all of these new issues; staying sober for the past 4 years.   All other systems are reviewed and negative.    PHYSICAL EXAM: VS:  BP 140/80   Pulse (!) 111   Ht '5\' 8"'$   (1.727 m)   Wt 203 lb 9.6 oz (92.4 kg)   BMI 30.96 kg/m  , BMI Body mass index is 30.96 kg/m. GEN: Well nourished, well developed, in no acute distress  HEENT: normal  Neck: no JVD, carotid bruits, or masses Cardiac: tacycardic; no murmurs, rubs, or gallops,no edema  Respiratory:  clear to auscultation bilaterally, normal work of breathing GI: soft, nontender, nondistended, + BS MS: no deformity or atrophy  Skin: warm and dry, no rash; 2+ right radial pulse Neuro:  Strength and sensation are intact Psych: euthymic mood, full affect   EKG:   The ekg ordered today demonstrates sinus tach, nonspecific  ST changes   Recent Labs: 12/19/2020: ALT 34; BUN 29; Creatinine, Ser 1.91; Hemoglobin 14.2; Platelets 305.0; Potassium 5.5 No hemolysis seen; Sodium 138; TSH 1.76   Lipid Panel    Component Value Date/Time   CHOL 157 12/19/2020 1200   CHOL 203 (H) 03/25/2015 0938   CHOL 286 (H) 10/12/2013 0436   TRIG 161.0 (H) 12/19/2020 1200   TRIG 572 (H) 10/12/2013 0436   TRIG 284 (HH) 07/15/2006 1158  HDL 37.40 (L) 12/19/2020 1200   HDL 52 03/25/2015 0938   HDL 35 (L) 10/12/2013 0436   CHOLHDL 4 12/19/2020 1200   VLDL 32.2 12/19/2020 1200   LDLCALC 88 12/19/2020 1200   LDLCALC  03/23/2020 1601     Comment:     . LDL cholesterol not calculated. Triglyceride levels greater than 400 mg/dL invalidate calculated LDL results. . Reference range: <100 . Desirable range <100 mg/dL for primary prevention;   <70 mg/dL for patients with CHD or diabetic patients  with > or = 2 CHD risk factors. Marland Kitchen LDL-C is now calculated using the Martin-Hopkins  calculation, which is a validated novel method providing  better accuracy than the Friedewald equation in the  estimation of LDL-C.  Cresenciano Genre et al. Annamaria Helling. [id number]): 2061-2068  (http://education.Quest[employer].com/faq/FAQ164)    LDLCALC NOT CALC 10/12/2013 0436   LDLDIRECT 90.0 09/26/2017 1715     Other studies Reviewed: Additional  studies/ records that were reviewed today with results demonstrating: labs and prior records reviewed.   ASSESSMENT AND PLAN:  1. DEcreased LVEF/abnormal echo:  EF 40-45%.  Some atypical chest pain.  Given prior carotid disease, he is at risk for CAD. Plan for cath.    Known to have low EF in 2018.  May have been alcohol related but need to r/o CAD.  2. HTN: Will add carvedilol 6.25 mg BID.  Ultimately, will need to change amlodipine to ACE-I or Entresto.  3. Hyperlipidemia: LDL 88.   4. CRI: Cr 1.96.  Stay well hydrated.  Avoid NSAIDs.   5. Tobacco abuse: He quit smoking. 6. Carotid disease: s/p bilateral CEA.  Needs routine Dopplers. He has continued statin for many years.   The patient understands that risks include but are not limited to stroke (1 in 1000), death (1 in 62), kidney failure [usually temporary] (1 in 500), bleeding (1 in 200), allergic reaction [possibly serious] (1 in 200), and agrees to proceed.    Current medicines are reviewed at length with the patient today.  The patient concerns regarding his medicines were addressed.  The following changes have been made:  No change  Labs/ tests ordered today include:  No orders of the defined types were placed in this encounter.   Recommend 150 minutes/week of aerobic exercise Low fat, low carb, high fiber diet recommended  Disposition:   FU for cath   Signed, Larae Grooms, MD  01/17/2021 9:43 AM    [facility] [place], [place], [place]  [address] Phone: [phone]; Fax: [phone]

## 2021-01-15 NOTE — Progress Notes (Signed)
Cardiology Office Note   Date:  01/17/2021   ID:  Martin Archer, DOB 1960-09-16, MRN [id number]  PCP:  Biagio Borg, MD    No chief complaint on file.    Wt Readings from Last 3 Encounters:  01/17/21 203 lb 9.6 oz (92.4 kg)  12/19/20 203 lb 12.8 oz (92.4 kg)  11/14/20 200 lb (90.7 kg)       History of Present Illness: Martin Archer is a 60 y.o. male who is being seen today for the evaluation of HTN and abnormal echo at the request of Biagio Borg, MD.  Prior h/o anemia, GI bleed, ulcer, CKD.  Lisinopril was increased in early 2022 for BP control.   11/2020 echo showed: "Left ventricular ejection fraction, by estimation, is 40 to 45%. The  left ventricle has mildly decreased function. The left ventricle  demonstrates global hypokinesis. The left ventricular internal cavity size  was moderately dilated. Left ventricular  diastolic parameters are consistent with Grade III diastolic dysfunction  (restrictive). Elevated left ventricular end-diastolic pressure.  2. Right ventricular systolic function is normal. The right ventricular  size is normal. There is mildly elevated pulmonary artery systolic  pressure. The estimated right ventricular systolic pressure is Q000111Q mmHg.  3. Left atrial size was severely dilated.  4. Right atrial size was severely dilated.  5. The mitral valve is normal in structure. Mild mitral valve  regurgitation. No evidence of mitral stenosis.  6. The aortic valve has an indeterminant number of cusps. Aortic valve  regurgitation is not visualized. Mild to moderate aortic valve  sclerosis/calcification is present, without any evidence of aortic  stenosis.  7. Aortic dilatation noted. There is mild dilatation of the aortic root,  measuring 38 mm. There is mild dilatation of the ascending aorta,  measuring 43 mm.  8. The inferior vena cava is normal in size with greater than 50%  respiratory variability, suggesting right atrial  pressure of 3 mmHg.  9. Compared to prior echo, LVF has declined."  He was having fatigue over the past several months.   He thinks he was told his heart was weak in the past. Records show from [id number]: "Acute systolic congestive heart failure with ejection fraction of 40%. Continue lisinopril and metoprolol. Continue low-dose oral Lasix. Seen by cardiology. Follow-up with cardiology at [facility] at discharge. Patient will need further evaluation in the form of a repeat echo and/ or coronary angiogram. Also had elevated troponin which was likely noncardiac."  He was having alcohol abuse issues at that time.   Denies : Dizziness. Leg edema. Nitroglycerin use. Orthopnea. Palpitations. Paroxysmal nocturnal dyspnea. Shortness of breath. Syncope.   H/o PAD with bilateral carotid endarterectomies, 2008, 2015.  He has had some left sided chest pain. Not walking much.   Past Medical History:  Diagnosis Date  . Anemia, unspecified   . Arthritis   . Diverticulosis of colon (without mention of hemorrhage)   . Gastric ulcer with hemorrhage and obstruction 2011   Dr [facility]  . GERD (gastroesophageal reflux disease)    hx  . Hyperlipemia   . Hypertension   . Personal history of colonic polyps 01/30/2010   hyperplastic rectal  . Sleep apnea    on CPAP; [facility]  . Stroke [facility])    mini strokes-bilateral carotid endarterectomy    Past Surgical History:  Procedure Laterality Date  . CAROTID ENDARTERECTOMY  11/2006   left Dr Mamie Nick  . COLONOSCOPY  2011   DP-positive FOBT/IDA  . ENDARTERECTOMY Right 01/19/2015   Procedure: RIGHT CAROTID ENDARTERECTOMY WITH PATCH ANGIOPLASTY;  Surgeon: Mal Misty, MD;  Location: [facility];  Service: Vascular;  Laterality: Right;  . ESOPHAGOGASTRODUODENOSCOPY Left 06/17/2013   Procedure: ESOPHAGOGASTRODUODENOSCOPY (EGD);  Surgeon: Arta Silence, MD;  Location: [facility] ENDOSCOPY;  Service: Endoscopy;  Laterality: Left;  .  ESOPHAGOGASTRODUODENOSCOPY N/A 09/20/2015   Procedure: ESOPHAGOGASTRODUODENOSCOPY (EGD);  Surgeon: Ladene Artist, MD;  Location: [facility] ENDOSCOPY;  Service: Endoscopy;  Laterality: N/A;  . FOOT SURGERY Bilateral 2020   Taylor's foot  . [place]  . NASAL FRACTURE SURGERY  1974  . TEE WITHOUT CARDIOVERSION N/A 01/17/2015   Procedure: TRANSESOPHAGEAL ECHOCARDIOGRAM (TEE);  Surgeon: Sanda Klein, MD;  Location: [facility] ENDOSCOPY;  Service: Cardiovascular;  Laterality: N/A;  . WISDOM TOOTH EXTRACTION       Current Outpatient Medications  Medication Sig Dispense Refill  . acetaminophen (TYLENOL) 325 MG tablet Take 650 mg by mouth every 6 (six) hours as needed.    Marland Kitchen amLODipine (NORVASC) 5 MG tablet Take 1 tablet (5 mg total) by mouth daily. 90 tablet 3  . atorvastatin (LIPITOR) 80 MG tablet TAKE 1 TABLET BY MOUTH DAILY 90 tablet 3   No current facility-administered medications for this visit.    Allergies:   Adderall [amphetamine-dextroamphet er], Prozac [fluoxetine hcl], Sertraline hcl, and Other    Social History:  The patient  reports that he quit smoking about 5 years ago. His smoking use included cigarettes. He smoked 0.50 packs per day. He has never used smokeless tobacco. He reports previous alcohol use. He reports that he does not use drugs.   Family History:  The patient's family history includes Cancer in his maternal grandfather; Diabetes in his father and paternal grandfather; Heart attack in his mother; Heart attack (age of onset: 76) in his brother; Heart attack (age of onset: 19) in his maternal grandfather; Hypertension in his father; Stroke in his father and paternal uncle.    ROS:  Please see the history of present illness.   Otherwise, review of systems are positive for some anxiety with all of these new issues; staying sober for the past 4 years.   All other systems are reviewed and negative.    PHYSICAL EXAM: VS:  BP 140/80   Pulse (!) 111   Ht '5\' 8"'$   (1.727 m)   Wt 203 lb 9.6 oz (92.4 kg)   BMI 30.96 kg/m  , BMI Body mass index is 30.96 kg/m. GEN: Well nourished, well developed, in no acute distress  HEENT: normal  Neck: no JVD, carotid bruits, or masses Cardiac: tacycardic; no murmurs, rubs, or gallops,no edema  Respiratory:  clear to auscultation bilaterally, normal work of breathing GI: soft, nontender, nondistended, + BS MS: no deformity or atrophy  Skin: warm and dry, no rash; 2+ right radial pulse Neuro:  Strength and sensation are intact Psych: euthymic mood, full affect   EKG:   The ekg ordered today demonstrates sinus tach, nonspecific  ST changes   Recent Labs: 12/19/2020: ALT 34; BUN 29; Creatinine, Ser 1.91; Hemoglobin 14.2; Platelets 305.0; Potassium 5.5 No hemolysis seen; Sodium 138; TSH 1.76   Lipid Panel    Component Value Date/Time   CHOL 157 12/19/2020 1200   CHOL 203 (H) 03/25/2015 0938   CHOL 286 (H) 10/12/2013 0436   TRIG 161.0 (H) 12/19/2020 1200   TRIG 572 (H) 10/12/2013 0436   TRIG 284 (HH) 07/15/2006 1158  HDL 37.40 (L) 12/19/2020 1200   HDL 52 03/25/2015 0938   HDL 35 (L) 10/12/2013 0436   CHOLHDL 4 12/19/2020 1200   VLDL 32.2 12/19/2020 1200   LDLCALC 88 12/19/2020 1200   LDLCALC  03/23/2020 1601     Comment:     . LDL cholesterol not calculated. Triglyceride levels greater than 400 mg/dL invalidate calculated LDL results. . Reference range: <100 . Desirable range <100 mg/dL for primary prevention;   <70 mg/dL for patients with CHD or diabetic patients  with > or = 2 CHD risk factors. Marland Kitchen LDL-C is now calculated using the Martin-Hopkins  calculation, which is a validated novel method providing  better accuracy than the Friedewald equation in the  estimation of LDL-C.  Cresenciano Genre et al. Annamaria Helling. [id number]): 2061-2068  (http://education.[facility].com/faq/FAQ164)    LDLCALC NOT CALC 10/12/2013 0436   LDLDIRECT 90.0 09/26/2017 1715     Other studies Reviewed: Additional  studies/ records that were reviewed today with results demonstrating: labs and prior records reviewed.   ASSESSMENT AND PLAN:  1. DEcreased LVEF/abnormal echo:  EF 40-45%.  Some atypical chest pain.  Given prior carotid disease, he is at risk for CAD. Plan for cath.    Known to have low EF in 2018.  May have been alcohol related but need to r/o CAD.  2. HTN: Will add carvedilol 6.25 mg BID.  Ultimately, will need to change amlodipine to ACE-I or Entresto.  3. Hyperlipidemia: LDL 88.   4. CRI: Cr 1.96.  Stay well hydrated.  Avoid NSAIDs.   5. Tobacco abuse: He quit smoking. 6. Carotid disease: s/p bilateral CEA.  Needs routine Dopplers. He has continued statin for many years.   The patient understands that risks include but are not limited to stroke (1 in 1000), death (1 in 44), kidney failure [usually temporary] (1 in 500), bleeding (1 in 200), allergic reaction [possibly serious] (1 in 200), and agrees to proceed.    Current medicines are reviewed at length with the patient today.  The patient concerns regarding his medicines were addressed.  The following changes have been made:  No change  Labs/ tests ordered today include:  No orders of the defined types were placed in this encounter.   Recommend 150 minutes/week of aerobic exercise Low fat, low carb, high fiber diet recommended  Disposition:   FU for cath   Signed, Larae Grooms, MD  01/17/2021 9:43 AM    [facility] [place], [place], [place]  [address] Phone: [phone]; Fax: [phone]

## 2021-01-17 ENCOUNTER — Other Ambulatory Visit: Payer: Self-pay

## 2021-01-17 ENCOUNTER — Encounter: Payer: Self-pay | Admitting: Interventional Cardiology

## 2021-01-17 ENCOUNTER — Ambulatory Visit (INDEPENDENT_AMBULATORY_CARE_PROVIDER_SITE_OTHER): Payer: Medicare Other | Admitting: Interventional Cardiology

## 2021-01-17 VITALS — BP 140/80 | HR 111 | Ht 68.0 in | Wt 203.6 lb

## 2021-01-17 DIAGNOSIS — I429 Cardiomyopathy, unspecified: Secondary | ICD-10-CM

## 2021-01-17 DIAGNOSIS — I6523 Occlusion and stenosis of bilateral carotid arteries: Secondary | ICD-10-CM

## 2021-01-17 DIAGNOSIS — I10 Essential (primary) hypertension: Secondary | ICD-10-CM

## 2021-01-17 DIAGNOSIS — E782 Mixed hyperlipidemia: Secondary | ICD-10-CM | POA: Diagnosis not present

## 2021-01-17 DIAGNOSIS — N1832 Chronic kidney disease, stage 3b: Secondary | ICD-10-CM

## 2021-01-17 MED ORDER — CARVEDILOL 6.25 MG PO TABS: 6.2500 mg | ORAL_TABLET | Freq: Two times a day (BID) | ORAL | 3 refills | Status: DC

## 2021-01-17 NOTE — Patient Instructions (Signed)
Medication Instructions:  Your physician has recommended you make the following change in your medication: Start Carvedilol 6.25 mg by mouth twice daily  *If you need a refill on your cardiac medications before your next appointment, please call your pharmacy*   Lab Work: Lab work to be done today--CBC and BMP If you have labs (blood work) drawn today and your tests are completely normal, you will receive your results only by: Marland Kitchen MyChart Message (if you have MyChart) OR . A paper copy in the mail If you have any lab test that is abnormal or we need to change your treatment, we will call you to review the results.   Testing/Procedures: Your physician has requested that you have a carotid duplex. This test is an ultrasound of the carotid arteries in your neck. It looks at blood flow through these arteries that supply the brain with blood. Allow one hour for this exam. There are no restrictions or special instructions.  Your physician has requested that you have a cardiac catheterization. Cardiac catheterization is used to diagnose and/or treat various heart conditions. Doctors may recommend this procedure for a number of different reasons. The most common reason is to evaluate chest pain. Chest pain can be a symptom of coronary artery disease (CAD), and cardiac catheterization can show whether plaque is narrowing or blocking your heart's arteries. This procedure is also used to evaluate the valves, as well as measure the blood flow and oxygen levels in different parts of your heart. For further information please visit HugeFiesta.tn. Please follow instruction sheet, as given. Scheduled for June 1,2022   Follow-Up: At [facility], you and your health needs are our priority.  As part of our continuing mission to provide you with exceptional heart care, we have created designated Provider Care Teams.  These Care Teams include your primary Cardiologist (physician) and Advanced Practice  Providers (APPs -  Physician Assistants and Nurse Practitioners) who all work together to provide you with the care you need, when you need it.  We recommend signing up for the patient portal called "MyChart".  Sign up information is provided on this After Visit Summary.  MyChart is used to connect with patients for Virtual Visits (Telemedicine).  Patients are able to view lab/test results, encounter notes, upcoming appointments, etc.  Non-urgent messages can be sent to your provider as well.   To learn more about what you can do with MyChart, go to [email or web address].    Your next appointment:   To be arranged after catheterization  The format for your next appointment:   In Person  Provider:   You may see Larae Grooms, MD or one of the following Advanced Practice Providers on your designated Care Team:    Melina Copa, PA-C  Ermalinda Barrios, PA-C    Other Instructions    [facility] OFF[facility] [place], [place] [address] Dept: [phone] Loc: [place]  01/17/2021  You are scheduled for a Cardiac Catheterization on Wednesday, June 1 with Dr. Larae Grooms.  1. Please arrive at the [facility] (Main Entrance A) at [facility]: [address] [place], [place] [address] at 8:00 AM (This time is 5 hours before your procedure to ensure your preparation). Need to arrive early for IV hydration Free valet parking service is available.   Special note: Every effort is made to have your procedure done on time. Please understand that emergencies  sometimes delay scheduled procedures.  2. Diet: Do not eat solid foods after midnight.  The patient may have clear liquids until 5am upon the day of the procedure.  3. Labs:done in office on May 24  4. Medication instructions in preparation for your procedure:   Contrast Allergy: No    On the morning  of your procedure, take   Aspirin 81 mg and any morning medicines NOT listed above.  You may use sips of water.  5. Plan for one night stay--bring personal belongings. 6. Bring a current list of your medications and current insurance cards. 7. You MUST have a responsible person to drive you home. 8. Someone MUST be with you the first 24 hours after you arrive home or your discharge will be delayed. 9. Please wear clothes that are easy to get on and off and wear slip-on shoes.  Thank you for allowing [place] to care for you!   --  Invasive Cardiovascular services

## 2021-01-18 ENCOUNTER — Ambulatory Visit (HOSPITAL_BASED_OUTPATIENT_CLINIC_OR_DEPARTMENT_OTHER): Admission: RE | Admit: 2021-01-18 | Payer: Medicare Other | Source: Home / Self Care | Admitting: Otolaryngology

## 2021-01-18 ENCOUNTER — Encounter (HOSPITAL_BASED_OUTPATIENT_CLINIC_OR_DEPARTMENT_OTHER): Admission: RE | Payer: Self-pay | Source: Home / Self Care

## 2021-01-18 LAB — CBC
Hematocrit: 48.5 % (ref 37.5–51.0)
Hemoglobin: 16 g/dL (ref 13.0–17.7)
MCH: 28.8 pg (ref 26.6–33.0)
MCHC: 33 g/dL (ref 31.5–35.7)
MCV: 87 fL (ref 79–97)
Platelets: 292 10*3/uL (ref 150–450)
RBC: 5.55 x10E6/uL (ref 4.14–5.80)
RDW: 14.7 % (ref 11.6–15.4)
WBC: 13.9 10*3/uL — ABNORMAL HIGH (ref 3.4–10.8)

## 2021-01-18 LAB — BASIC METABOLIC PANEL
BUN/Creatinine Ratio: 11 (ref 10–24)
BUN: 17 mg/dL (ref 8–27)
CO2: 25 mmol/L (ref 20–29)
Calcium: 9.4 mg/dL (ref 8.6–10.2)
Chloride: 97 mmol/L (ref 96–106)
Creatinine, Ser: 1.61 mg/dL — ABNORMAL HIGH (ref 0.76–1.27)
Glucose: 87 mg/dL (ref 65–99)
Potassium: 4.5 mmol/L (ref 3.5–5.2)
Sodium: 137 mmol/L (ref 134–144)
eGFR: 48 mL/min/{1.73_m2} — ABNORMAL LOW (ref >59)

## 2021-01-18 SURGERY — DRUG INDUCED SLEEP ENDOSCOPY
Anesthesia: General

## 2021-01-24 ENCOUNTER — Ambulatory Visit (HOSPITAL_COMMUNITY)
Admission: RE | Admit: 2021-01-24 | Discharge: 2021-01-24 | Disposition: A | Discharge status: Home / Self Care | Payer: Medicare Other | Source: Ambulatory Visit | Attending: Cardiology | Admitting: Cardiology

## 2021-01-24 ENCOUNTER — Other Ambulatory Visit: Payer: Self-pay

## 2021-01-24 ENCOUNTER — Other Ambulatory Visit (HOSPITAL_COMMUNITY): Payer: Self-pay | Admitting: Interventional Cardiology

## 2021-01-24 DIAGNOSIS — I429 Cardiomyopathy, unspecified: Secondary | ICD-10-CM | POA: Insufficient documentation

## 2021-01-24 DIAGNOSIS — Z9889 Other specified postprocedural states: Secondary | ICD-10-CM

## 2021-01-24 DIAGNOSIS — I6523 Occlusion and stenosis of bilateral carotid arteries: Secondary | ICD-10-CM

## 2021-01-24 NOTE — Pre-Procedure Instructions (Signed)
Attempted to call patient regarding cath procedure for tomorrow.  Left voice mail on the following items.  Arrival time is 8:00, coming in early for extra fluids  Verified arrival time and place: [facility]) at:   No solid food after midnight prior to cath, clear liquids until 5 AM day of procedure. CONTRAST ALLERGY: unable to ask, not listed in EPIC  AM meds can be  taken pre-cath with sips of water including: ASA 81 mg   Confirmed patient has responsible adult to drive home post procedure and be with patient first 24 hours after arriving home:  You are allowed ONE visitor in the waiting room during the time you are at the hospital for your procedure. Both you and your visitor must wear a mask once you enter the hospital.   Patient reports does not currently have any symptoms concerning for COVID-19 and no household members with COVID-19 like illness.  Instructed if he or anyone in his family had covid symptoms to please contact office to reschedule.

## 2021-01-25 ENCOUNTER — Encounter (HOSPITAL_COMMUNITY)
Admission: RE | Disposition: A | Discharge status: Home / Self Care | Payer: Self-pay | Source: Home / Self Care | Attending: Interventional Cardiology

## 2021-01-25 ENCOUNTER — Other Ambulatory Visit: Payer: Self-pay

## 2021-01-25 ENCOUNTER — Ambulatory Visit (HOSPITAL_COMMUNITY)
Admission: RE | Admit: 2021-01-25 | Discharge: 2021-01-25 | Disposition: A | Discharge status: Home / Self Care | Payer: Medicare Other | Attending: Interventional Cardiology | Admitting: Interventional Cardiology

## 2021-01-25 DIAGNOSIS — I779 Disorder of arteries and arterioles, unspecified: Secondary | ICD-10-CM | POA: Insufficient documentation

## 2021-01-25 DIAGNOSIS — Z79899 Other long term (current) drug therapy: Secondary | ICD-10-CM | POA: Insufficient documentation

## 2021-01-25 DIAGNOSIS — Z888 Allergy status to other drugs, medicaments and biological substances status: Secondary | ICD-10-CM | POA: Insufficient documentation

## 2021-01-25 DIAGNOSIS — Z87891 Personal history of nicotine dependence: Secondary | ICD-10-CM | POA: Diagnosis not present

## 2021-01-25 DIAGNOSIS — N189 Chronic kidney disease, unspecified: Secondary | ICD-10-CM | POA: Diagnosis not present

## 2021-01-25 DIAGNOSIS — Z8673 Personal history of transient ischemic attack (TIA), and cerebral infarction without residual deficits: Secondary | ICD-10-CM | POA: Insufficient documentation

## 2021-01-25 DIAGNOSIS — I130 Hypertensive heart and chronic kidney disease with heart failure and stage 1 through stage 4 chronic kidney disease, or unspecified chronic kidney disease: Secondary | ICD-10-CM | POA: Insufficient documentation

## 2021-01-25 DIAGNOSIS — Z8711 Personal history of peptic ulcer disease: Secondary | ICD-10-CM | POA: Insufficient documentation

## 2021-01-25 DIAGNOSIS — I5022 Chronic systolic (congestive) heart failure: Secondary | ICD-10-CM

## 2021-01-25 DIAGNOSIS — Z8719 Personal history of other diseases of the digestive system: Secondary | ICD-10-CM | POA: Diagnosis not present

## 2021-01-25 DIAGNOSIS — I2510 Atherosclerotic heart disease of native coronary artery without angina pectoris: Secondary | ICD-10-CM | POA: Insufficient documentation

## 2021-01-25 DIAGNOSIS — I2582 Chronic total occlusion of coronary artery: Secondary | ICD-10-CM | POA: Diagnosis not present

## 2021-01-25 DIAGNOSIS — G4730 Sleep apnea, unspecified: Secondary | ICD-10-CM | POA: Insufficient documentation

## 2021-01-25 DIAGNOSIS — Z8249 Family history of ischemic heart disease and other diseases of the circulatory system: Secondary | ICD-10-CM | POA: Diagnosis not present

## 2021-01-25 DIAGNOSIS — E785 Hyperlipidemia, unspecified: Secondary | ICD-10-CM | POA: Insufficient documentation

## 2021-01-25 HISTORY — PX: CARDIAC CATHETERIZATION: SHX172

## 2021-01-25 HISTORY — PX: RIGHT/LEFT HEART CATH AND CORONARY ANGIOGRAPHY: CATH118266

## 2021-01-25 LAB — POCT I-STAT EG7
Acid-Base Excess: 2 mmol/L (ref 0.0–2.0)
Acid-Base Excess: 3 mmol/L — ABNORMAL HIGH (ref 0.0–2.0)
Bicarbonate: 28.4 mmol/L — ABNORMAL HIGH (ref 20.0–28.0)
Bicarbonate: 28.5 mmol/L — ABNORMAL HIGH (ref 20.0–28.0)
Calcium, Ion: 1.19 mmol/L (ref 1.15–1.40)
Calcium, Ion: 1.21 mmol/L (ref 1.15–1.40)
HCT: 42 % (ref 39.0–52.0)
HCT: 42 % (ref 39.0–52.0)
Hemoglobin: 14.3 g/dL (ref 13.0–17.0)
Hemoglobin: 14.3 g/dL (ref 13.0–17.0)
O2 Saturation: 66 %
O2 Saturation: 68 %
Potassium: 4 mmol/L (ref 3.5–5.1)
Potassium: 4.1 mmol/L (ref 3.5–5.1)
Sodium: 142 mmol/L (ref 135–145)
Sodium: 142 mmol/L (ref 135–145)
TCO2: 30 mmol/L (ref 22–32)
TCO2: 30 mmol/L (ref 22–32)
pCO2, Ven: 48.2 mmHg (ref 44.0–60.0)
pCO2, Ven: 49.2 mmHg (ref 44.0–60.0)
pH, Ven: 7.369 (ref 7.250–7.430)
pH, Ven: 7.381 (ref 7.250–7.430)
pO2, Ven: 36 mmHg (ref 32.0–45.0)
pO2, Ven: 37 mmHg (ref 32.0–45.0)

## 2021-01-25 LAB — POCT I-STAT 7, (LYTES, BLD GAS, ICA,H+H)
Acid-Base Excess: 2 mmol/L (ref 0.0–2.0)
Bicarbonate: 27.4 mmol/L (ref 20.0–28.0)
Calcium, Ion: 1.22 mmol/L (ref 1.15–1.40)
HCT: 43 % (ref 39.0–52.0)
Hemoglobin: 14.6 g/dL (ref 13.0–17.0)
O2 Saturation: 94 %
Potassium: 4 mmol/L (ref 3.5–5.1)
Sodium: 142 mmol/L (ref 135–145)
TCO2: 29 mmol/L (ref 22–32)
pCO2 arterial: 45.5 mmHg (ref 32.0–48.0)
pH, Arterial: 7.388 (ref 7.350–7.450)
pO2, Arterial: 73 mmHg — ABNORMAL LOW (ref 83.0–108.0)

## 2021-01-25 SURGERY — RIGHT/LEFT HEART CATH AND CORONARY ANGIOGRAPHY
Anesthesia: LOCAL

## 2021-01-25 MED ORDER — FENTANYL CITRATE (PF) 100 MCG/2ML IJ SOLN
INTRAMUSCULAR | Status: DC | PRN
  Administered 2021-01-25: 25 ug via INTRAVENOUS

## 2021-01-25 MED ORDER — SODIUM CHLORIDE 0.9% FLUSH: 3.0000 mL | INTRAVENOUS | Status: DC | PRN

## 2021-01-25 MED ORDER — HYDRALAZINE HCL 20 MG/ML IJ SOLN: 10.0000 mg | INTRAMUSCULAR | Status: DC | PRN

## 2021-01-25 MED ORDER — SODIUM CHLORIDE 0.9 % IV SOLN: INTRAVENOUS | Status: DC

## 2021-01-25 MED ORDER — AMLODIPINE BESYLATE 5 MG PO TABS
5.0000 mg | ORAL_TABLET | Freq: Once | ORAL | Status: AC
  Administered 2021-01-25: 5 mg via ORAL
  Filled 2021-01-25: qty 1

## 2021-01-25 MED ORDER — HEPARIN SODIUM (PORCINE) 1000 UNIT/ML IJ SOLN
INTRAMUSCULAR | Status: AC
  Filled 2021-01-25: qty 1

## 2021-01-25 MED ORDER — SODIUM CHLORIDE 0.9% FLUSH: 3.0000 mL | Freq: Two times a day (BID) | INTRAVENOUS | Status: DC

## 2021-01-25 MED ORDER — HEPARIN (PORCINE) IN NACL 1000-0.9 UT/500ML-% IV SOLN
INTRAVENOUS | Status: DC | PRN
  Administered 2021-01-25 (×2): 500 mL

## 2021-01-25 MED ORDER — VERAPAMIL HCL 2.5 MG/ML IV SOLN
INTRAVENOUS | Status: AC
  Filled 2021-01-25: qty 2

## 2021-01-25 MED ORDER — ASPIRIN 81 MG PO CHEW: 81.0000 mg | CHEWABLE_TABLET | ORAL | Status: DC

## 2021-01-25 MED ORDER — ACETAMINOPHEN 325 MG PO TABS: 650.0000 mg | ORAL_TABLET | ORAL | Status: DC | PRN

## 2021-01-25 MED ORDER — SODIUM CHLORIDE 0.9 % IV SOLN: 250.0000 mL | INTRAVENOUS | Status: DC | PRN

## 2021-01-25 MED ORDER — LIDOCAINE HCL (PF) 1 % IJ SOLN
INTRAMUSCULAR | Status: AC
  Filled 2021-01-25: qty 30

## 2021-01-25 MED ORDER — VERAPAMIL HCL 2.5 MG/ML IV SOLN
INTRAVENOUS | Status: DC | PRN
  Administered 2021-01-25 (×2): 10 mL via INTRA_ARTERIAL

## 2021-01-25 MED ORDER — HEPARIN (PORCINE) IN NACL 1000-0.9 UT/500ML-% IV SOLN
INTRAVENOUS | Status: AC
  Filled 2021-01-25: qty 1000

## 2021-01-25 MED ORDER — ONDANSETRON HCL 4 MG/2ML IJ SOLN: 4.0000 mg | Freq: Four times a day (QID) | INTRAMUSCULAR | Status: DC | PRN

## 2021-01-25 MED ORDER — SODIUM CHLORIDE 0.9 % IV SOLN: INTRAVENOUS | Status: AC

## 2021-01-25 MED ORDER — MIDAZOLAM HCL 2 MG/2ML IJ SOLN
INTRAMUSCULAR | Status: DC | PRN
  Administered 2021-01-25: 2 mg via INTRAVENOUS

## 2021-01-25 MED ORDER — LIDOCAINE HCL (PF) 1 % IJ SOLN
INTRAMUSCULAR | Status: DC | PRN
  Administered 2021-01-25 (×2): 2 mL

## 2021-01-25 MED ORDER — LABETALOL HCL 5 MG/ML IV SOLN: 10.0000 mg | INTRAVENOUS | Status: DC | PRN

## 2021-01-25 MED ORDER — HEPARIN SODIUM (PORCINE) 1000 UNIT/ML IJ SOLN
INTRAMUSCULAR | Status: DC | PRN
  Administered 2021-01-25: 4500 [IU] via INTRAVENOUS

## 2021-01-25 MED ORDER — FENTANYL CITRATE (PF) 100 MCG/2ML IJ SOLN
INTRAMUSCULAR | Status: AC
  Filled 2021-01-25: qty 2

## 2021-01-25 MED ORDER — MIDAZOLAM HCL 2 MG/2ML IJ SOLN
INTRAMUSCULAR | Status: AC
  Filled 2021-01-25: qty 2

## 2021-01-25 SURGICAL SUPPLY — 11 items
CATH 5FR JL3.5 JR4 ANG PIG MP (CATHETERS) ×1 IMPLANT
CATH SWAN GANZ 7F STRAIGHT (CATHETERS) ×1 IMPLANT
DEVICE RAD COMP TR BAND LRG (VASCULAR PRODUCTS) ×1 IMPLANT
GLIDESHEATH SLEND SS 6F .021 (SHEATH) ×1 IMPLANT
GLIDESHEATH SLENDER 7FR .021G (SHEATH) ×1 IMPLANT
GUIDEWIRE INQWIRE 1.5J.035X260 (WIRE) IMPLANT
INQWIRE 1.5J .035X260CM (WIRE) ×2
KIT HEART LEFT (KITS) ×2 IMPLANT
PACK CARDIAC CATHETERIZATION (CUSTOM PROCEDURE TRAY) ×2 IMPLANT
TRANSDUCER W/STOPCOCK (MISCELLANEOUS) ×2 IMPLANT
TUBING CIL FLEX 10 FLL-RA (TUBING) ×2 IMPLANT

## 2021-01-25 NOTE — Discharge Instructions (Signed)
Radial Site Care  This sheet gives you information about how to care for yourself after your procedure. Your health care provider may also give you more specific instructions. If you have problems or questions, contact your health care provider. What can I expect after the procedure? After the procedure, it is common to have:  Bruising and tenderness at the catheter insertion area. Follow these instructions at home: Medicines  Take over-the-counter and prescription medicines only as told by your health care provider. Insertion site care 1. Follow instructions from your health care provider about how to take care of your insertion site. Make sure you: ? Wash your hands with soap and water before you remove your bandage (dressing). If soap and water are not available, use hand sanitizer. ? May remove dressing in 24 hours. 2. Check your insertion site every day for signs of infection. Check for: ? Redness, swelling, or pain. ? Fluid or blood. ? Pus or a bad smell. ? Warmth. 3. Do no take baths, swim, or use a hot tub for 5 days. 4. You may shower 24-48 hours after the procedure. ? Remove the dressing and gently wash the site with plain soap and water. ? Pat the area dry with a clean towel. ? Do not rub the site. That could cause bleeding. 5. Do not apply powder or lotion to the site. Activity  1. For 24 hours after the procedure, or as directed by your health care provider: ? Do not flex or bend the affected arm. ? Do not push or pull heavy objects with the affected arm. ? Do not drive yourself home from the hospital or clinic. You may drive 24 hours after the procedure. ? Do not operate machinery or power tools. ? KEEP ARM ELEVATED THE REMAINDER OF THE DAY. 2. Do not push, pull or lift anything that is heavier than 10 lb for 5 days. 3. Ask your health care provider when it is okay to: ? Return to work or school. ? Resume usual physical activities or sports. ? Resume sexual  activity. General instructions  If the catheter site starts to bleed, raise your arm and put firm pressure on the site. If the bleeding does not stop, get help right away. This is a medical emergency.  DRINK PLENTY OF FLUIDS FOR THE NEXT 2-3 DAYS.  No alcohol consumption for 24 hours after receiving sedation.  If you went home on the same day as your procedure, a responsible adult should be with you for the first 24 hours after you arrive home.  Keep all follow-up visits as told by your health care provider. This is important. Contact a health care provider if:  You have a fever.  You have redness, swelling, or yellow drainage around your insertion site. Get help right away if:  You have unusual pain at the radial site.  The catheter insertion area swells very fast.  The insertion area is bleeding, and the bleeding does not stop when you hold steady pressure on the area.  Your arm or hand becomes pale, cool, tingly, or numb. These symptoms may represent a serious problem that is an emergency. Do not wait to see if the symptoms will go away. Get medical help right away. Call your local emergency services (911 in the [place] Do not drive yourself to the hospital. Summary  After the procedure, it is common to have bruising and tenderness at the site.  Follow instructions from your health care provider about how to take care   of your radial site wound. Check the wound every day for signs of infection.  This information is not intended to replace advice given to you by your health care provider. Make sure you discuss any questions you have with your health care provider. Document Revised: 09/18/2017 Document Reviewed: 09/18/2017 Elsevier Patient Education  2020 Elsevier Inc[employer] 

## 2021-01-25 NOTE — Progress Notes (Signed)
Pt ambulated without difficulty or bleeding. AVS instructions reviewed with pt and sister.

## 2021-01-25 NOTE — Progress Notes (Signed)
Bp elevated Rennis Harding, Rn called and informed  New orders noted.

## 2021-01-25 NOTE — Interval H&P Note (Signed)
Cath Lab Visit (complete for each Cath Lab visit)  Clinical Evaluation Leading to the Procedure:   ACS: No.  Non-ACS:    Anginal Classification: CCS II  Anti-ischemic medical therapy: Minimal Therapy (1 class of medications)  Non-Invasive Test Results: Intermediate-risk stress test findings: cardiac mortality 1-3%/year  Prior CABG: No previous CABG   Low EF by echo   History and Physical Interval Note:  01/25/2021 1:19 PM  Martin Archer  has presented today for surgery, with the diagnosis of cardiomyopathy.  The various methods of treatment have been discussed with the patient and family. After consideration of risks, benefits and other options for treatment, the patient has consented to  Procedure(s): RIGHT/LEFT HEART CATH AND CORONARY ANGIOGRAPHY (N/A) as a surgical intervention.  The patient's history has been reviewed, patient examined, no change in status, stable for surgery.  I have reviewed the patient's chart and labs.  Questions were answered to the patient's satisfaction.     Larae Grooms

## 2021-01-26 ENCOUNTER — Encounter (HOSPITAL_COMMUNITY): Payer: Self-pay | Admitting: Interventional Cardiology

## 2021-01-31 ENCOUNTER — Telehealth: Payer: Self-pay | Admitting: *Deleted

## 2021-01-31 DIAGNOSIS — Z79899 Other long term (current) drug therapy: Secondary | ICD-10-CM

## 2021-01-31 DIAGNOSIS — I5022 Chronic systolic (congestive) heart failure: Secondary | ICD-10-CM

## 2021-01-31 DIAGNOSIS — I429 Cardiomyopathy, unspecified: Secondary | ICD-10-CM

## 2021-01-31 DIAGNOSIS — I771 Stricture of artery: Secondary | ICD-10-CM

## 2021-01-31 NOTE — Telephone Encounter (Signed)
-----   Message from Jettie Booze, MD sent at 01/30/2021  9:10 PM EDT ----- Left carotid occluded; known from prior.  Left arm blockage.  Any weakness in the left arm?  Dizziness when he lifts things with his left arm?

## 2021-01-31 NOTE — Telephone Encounter (Signed)
Patient also needs follow up with PharmD for titration of heart failure medications in the next few weeks.  Dr Irish Lack would like to see patient back in 3-4 months. I placed call to patient and left message to call office

## 2021-02-01 NOTE — Telephone Encounter (Signed)
Patient returning call.

## 2021-02-01 NOTE — Telephone Encounter (Signed)
I spoke with patient and he would like to proceed with referral. He is aware appointment will be at [facility] office

## 2021-02-01 NOTE — Telephone Encounter (Signed)
I spoke with patient and reviewed carotid doppler results with him. 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  This is not occurring as frequently now as it has in the past. He has had dizziness for awhile.  Happens at random times and not associated with using left arm.  Dizziness has improved some after he stopped drinking alcohol.    He is asking about plan for blockage in his left arm. I scheduled patient to see pharmacist on June 29 at 9:30 and Dr Irish Lack on October 3 at 8:40

## 2021-02-01 NOTE — Telephone Encounter (Signed)
Left message to call office

## 2021-02-01 NOTE — Telephone Encounter (Signed)
Patient was returning call 

## 2021-02-01 NOTE — Telephone Encounter (Signed)
No intervention needed for the left carotid blockage since the other vessels provide circulation to that territory.    For the left arm blockage, can refer to Dr. Gwenlyn Found or Dr. Fletcher Anon to eval and see if intervention would be needed.  Typically, if he is not having a lot of sx, he would not need a stent.

## 2021-02-22 ENCOUNTER — Other Ambulatory Visit: Payer: Self-pay

## 2021-02-22 ENCOUNTER — Ambulatory Visit (INDEPENDENT_AMBULATORY_CARE_PROVIDER_SITE_OTHER): Payer: Medicare Other | Admitting: Pharmacist

## 2021-02-22 VITALS — BP 148/100 | HR 81 | Wt 206.0 lb

## 2021-02-22 DIAGNOSIS — I10 Essential (primary) hypertension: Secondary | ICD-10-CM | POA: Diagnosis not present

## 2021-02-22 DIAGNOSIS — I5022 Chronic systolic (congestive) heart failure: Secondary | ICD-10-CM

## 2021-02-22 DIAGNOSIS — I6523 Occlusion and stenosis of bilateral carotid arteries: Secondary | ICD-10-CM | POA: Diagnosis not present

## 2021-02-22 DIAGNOSIS — E785 Hyperlipidemia, unspecified: Secondary | ICD-10-CM

## 2021-02-22 MED ORDER — EZETIMIBE 10 MG PO TABS: 10.0000 mg | ORAL_TABLET | Freq: Every day | ORAL | 3 refills | Status: DC

## 2021-02-22 MED ORDER — ENTRESTO 24-26 MG PO TABS: 1.0000 | ORAL_TABLET | Freq: Two times a day (BID) | ORAL | 1 refills | Status: DC

## 2021-02-22 NOTE — Patient Instructions (Addendum)
Cholesterol: - Your LDL is 88 and your goal is < 70, and < 55 would be even better - Start taking ezetimibe (Zetia) '10mg'$  - 1 tablet once daily  - Continue taking atorvastatin (Lipitor) '80mg'$  - 1 tablet once daily  Blood pressure/heart: - Your blood pressure goal is < 130/5mHg - Increase carvedilol to 12.'5mg'$  - you can take 2 tablets of the 6.'25mg'$  twice daily - Stop taking amlodipine - Start taking Entresto 24-'26mg'$  - 1 tablet twice daily  Continue to monitor your blood pressure at home. Please bring in a log of your readings and your cuff again to your next visit  Follow up in 2 weeks for blood pressure check and lab work

## 2021-02-22 NOTE — Progress Notes (Signed)
Patient ID: Martin Archer                 DOB: [birth year]                      MRN: [id number]     HPI: Martin Archer is a 60 y.o. male referred by Dr. Irish Lack to pharmacy clinic for HF medication management. PMH is significant for HTN, CKD, PAD with bilateral carotid endarterectomies, GI bleed, anemia, and mildly decreased LVEF of 40-45% on 11/2020 echo with grade III diastolic dysfunction and elevated LVEDP. He underwent R/L heart cath 01/25/21 that showed 100% prox RCA stenosis, 70% 3rd mrg stenosis, 25% 1st diag stenosis, mild pulmonary HTN. LV dysfunction out of proportion to degree of CAD, medication therapy for HF recommended.  Pt presents today in good spirits. Reports tolerating medications well. Occasionally notes dizziness if he's been sitting for a long time and then stands up. Sometimes notes LE edema, used to take Lasix in the past for this, currently uses compression stockings if needed. Recently purchased a bicep BP cuff a few months ago, home readings ranging 120-140/96. Higher in office today 167/116 but manual reading was 148/100. Doesn't sit for 5-10 mins before checking BP at home, otherwise technique is appropriate. Pt advised to check BP in his right arm only due to blockage.Trying to walk more and golfs, does have some neuropathy in his feet.  Current CHF/HTN meds:  Amlodipine '5mg'$  daily Carvedilol 6.'25mg'$  BID  BP goal: <130/47mHg  Family History: The patient's family history includes Cancer in his maternal grandfather; Diabetes in his father and paternal grandfather; Heart attack in his mother; Heart attack (age of onset: 525 in his brother; Heart attack (age of onset: 783 in his maternal grandfather; Hypertension in his father; Stroke in his father and paternal uncle.   Social History: The patient  reports that he quit smoking about 5 years ago. His smoking use included cigarettes. He smoked 0.50 packs per day. He has never used smokeless tobacco. He reports  previous alcohol use. He reports that he does not use drugs.   Diet: Did not discuss - will plan to at next visit  Exercise: Plays golf, starting to walk more, trying to increase his stamina  Home BP readings: 120-140/96  Wt Readings from Last 3 Encounters:  01/25/21 200 lb (90.7 kg)  01/17/21 203 lb 9.6 oz (92.4 kg)  12/19/20 203 lb 12.8 oz (92.4 kg)   BP Readings from Last 3 Encounters:  01/25/21 118/87  01/17/21 140/80  12/19/20 (!) 112/96   Pulse Readings from Last 3 Encounters:  01/25/21 85  01/17/21 (!) 111  12/19/20 83    Renal function: CrCl cannot be calculated (Patient's most recent lab result is older than the maximum 21 days allowed.).  Past Medical History:  Diagnosis Date   Anemia, unspecified    Arthritis    Diverticulosis of colon (without mention of hemorrhage)    Gastric ulcer with hemorrhage and obstruction 2011   [facility]   GERD (gastroesophageal reflux disease)    hx   Hyperlipemia    Hypertension    Personal history of colonic polyps 01/30/2010   hyperplastic rectal   Sleep apnea    on CPAP; [facility]   Stroke ([facility]    mini strokes-bilateral carotid endarterectomy    Current Outpatient Medications on File Prior to Visit  Medication Sig Dispense Refill   acetaminophen (TYLENOL) 325 MG tablet Take 650 mg  by mouth every 6 (six) hours as needed for moderate pain or headache.     amLODipine (NORVASC) 5 MG tablet Take 1 tablet (5 mg total) by mouth daily. 90 tablet 3   atorvastatin (LIPITOR) 80 MG tablet TAKE 1 TABLET BY MOUTH DAILY (Patient taking differently: Take 80 mg by mouth daily.) 90 tablet 3   Carboxymethylcellul-Glycerin (LUBRICATING EYE DROPS OP) Place 1 drop into both eyes daily as needed (dry eyes).     carvedilol (COREG) 6.25 MG tablet Take 1 tablet (6.25 mg total) by mouth 2 (two) times daily. 180 tablet 3   doxylamine, Sleep, (UNISOM) 25 MG tablet Take 25 mg by mouth at bedtime as needed for sleep.      Multiple Vitamin (MULTIVITAMIN WITH MINERALS) TABS tablet Take 1 tablet by mouth daily.     No current facility-administered medications on file prior to visit.    Allergies  Allergen Reactions   Adderall [Amphetamine-Dextroamphet Er] Diarrhea   Prozac [Fluoxetine Hcl] Diarrhea   Sertraline Hcl Diarrhea   Advil [Ibuprofen] Diarrhea     Assessment/Plan:  1. CHF - LVEF 40-45% on 11/2020 echo with room to optimize CHF meds. Will increase carvedilol from 6.'25mg'$  to 12.'5mg'$  BID (pt has plenty at home and will double up for now, will send in new rx at next visit). Will stop amlodipine '5mg'$  daily and start Entresto 24-'26mg'$  BID, should also help with mild swelling reported by pt. Rx $4 at his pharmacy with both Medicare and Medicaid coverage. Recheck BP and BMET in 2 weeks with plan to further increase Entresto, carvedilol, and add SGLT2i therapy as able.  2. Hyperlipidemia - LDL 88 on 12/19/20, goal < 70 due to CAD and PAD, ideally < 55 if possible. Will start ezetimibe '10mg'$  daily and continue atorvastatin '80mg'$  daily. Will plan to recheck lipids in 6 weeks.  Karyme Mcconathy E. Maverick Dieudonne, PharmD, BCACP, [facility] [id number] [place], [place] [address] Phone: [phone]; Fax: [phone] 02/22/2021 10:19 AM

## 2021-03-09 ENCOUNTER — Ambulatory Visit (INDEPENDENT_AMBULATORY_CARE_PROVIDER_SITE_OTHER): Payer: Medicare Other | Admitting: Pharmacist

## 2021-03-09 ENCOUNTER — Other Ambulatory Visit: Payer: Self-pay

## 2021-03-09 VITALS — BP 152/104 | HR 82 | Wt 207.0 lb

## 2021-03-09 DIAGNOSIS — I5022 Chronic systolic (congestive) heart failure: Secondary | ICD-10-CM

## 2021-03-09 DIAGNOSIS — I10 Essential (primary) hypertension: Secondary | ICD-10-CM | POA: Diagnosis not present

## 2021-03-09 DIAGNOSIS — E785 Hyperlipidemia, unspecified: Secondary | ICD-10-CM | POA: Diagnosis not present

## 2021-03-09 LAB — BASIC METABOLIC PANEL
BUN/Creatinine Ratio: 17 (ref 10–24)
BUN: 28 mg/dL — ABNORMAL HIGH (ref 8–27)
CO2: 22 mmol/L (ref 20–29)
Calcium: 8.9 mg/dL (ref 8.6–10.2)
Chloride: 103 mmol/L (ref 96–106)
Creatinine, Ser: 1.65 mg/dL — ABNORMAL HIGH (ref 0.76–1.27)
Glucose: 106 mg/dL — ABNORMAL HIGH (ref 65–99)
Potassium: 4.6 mmol/L (ref 3.5–5.2)
Sodium: 140 mmol/L (ref 134–144)
eGFR: 47 mL/min/{1.73_m2} — ABNORMAL LOW (ref >59)

## 2021-03-09 MED ORDER — CARVEDILOL 25 MG PO TABS: 25.0000 mg | ORAL_TABLET | Freq: Two times a day (BID) | ORAL | 3 refills | Status: DC

## 2021-03-09 NOTE — Patient Instructions (Signed)
Your blood pressure goal is < 130/72mHg  Increase your carvedilol to '25mg'$  - 1 tablet twice daily  We will check your labs today and if they are stable, will increase you Entresto to 49-'51mg'$  twice daily. I'll call you tomorrow with these results to finalize your medication plan  Aim for 150 minutes of exercise each week - increase your walking as able

## 2021-03-09 NOTE — Progress Notes (Addendum)
Patient ID: Martin Archer                 DOB: 27-Nov-1960                      MRN: [id number]     HPI: [name] is a 60 y.o. male referred by Dr. Irish Lack to pharmacy clinic for HF medication management. PMH is significant for HTN, CKD, PAD with bilateral carotid endarterectomies, GI bleed, anemia, and mildly decreased LVEF of 40-45% on 11/2020 echo with grade III diastolic dysfunction and elevated LVEDP. He underwent R/L heart cath 01/25/21 that showed 100% prox RCA stenosis, 70% 3rd mrg stenosis, 25% 1st diag stenosis, mild pulmonary HTN. LV dysfunction out of proportion to degree of CAD, medication therapy for HF recommended. I saw pt on 02/22/21 when BP was elevated at 148/100. Carvedilol was increased, amlodipine was stopped, and Entresto was started for CHF. Ezetimibe was also started to target LDL goal < 70.  Pt presents today in good spirits. Reports tolerating medications well. LE edema has resolved since starting Entresto and stopping amloidpine. Denies dizziness, SOB, headache. Recently purchased a bicep BP cuff a few months ago, home readings ranging 140/100, HR 85. Did not bring home cuff today, last visit was 15-23mHg higher than clinic reading. Takes meds at 9am and 9pm, took AM doses today a bit earlier around 7am due to appt. Pt advised to check BP in his right arm only due to blockage. Trying to walk more and golfs, does have some neuropathy in his feet. Has eaten about 10 hot dogs in the past week because of 4th of July. Historically has noted more LE edema after higher sodium meals. Branded copays affordable ($4 for Entresto due to both Medicare and Medicaid insurance coverage).  Current CHF/HTN meds:  Entresto 24-'26mg'$  BID - 9am, 9pm Carvedilol 12.'5mg'$  BID - 9am, 9pm  BP goal: <130/843mg  Family History: The patient's family history includes Cancer in his maternal grandfather; Diabetes in his father and paternal grandfather; Heart attack in his mother; Heart attack  (age of onset: 5982in his brother; Heart attack (age of onset: 7683in his maternal grandfather; Hypertension in his father; Stroke in his father and paternal uncle.   Social History: The patient  reports that he quit smoking about 5 years ago. His smoking use included cigarettes. He smoked 0.50 packs per day. He has never used smokeless tobacco. He reports previous alcohol use. He reports that he does not use drugs.   Diet: Ate 3 hot dogs on 4th of July, 10 in past week. Breakfast - fruit, toast or bacon. Lunch - sandwich (tuna salad, chicken salad, tu[place] with cheese, tomato and mayo). Dinner - beef or chicken, vegetable. Snacks - ice cream. Does not add salt to food regularly. 1 cup of coffee in AM - half caff, 1 glass of unsweet tea with sugar substitute, occasionally a soda, drinks a lot of water - does add energy substitute to it. 2-3 servings of caffeine a day.   Exercise: Plays golf, walking 1/2 mile every other day with plans to increase, trying to increase his stamina  Home BP readings: 140/100  Wt Readings from Last 3 Encounters:  02/22/21 206 lb (93.4 kg)  01/25/21 200 lb (90.7 kg)  01/17/21 203 lb 9.6 oz (92.4 kg)   BP Readings from Last 3 Encounters:  02/22/21 (!) 148/100  01/25/21 118/87  01/17/21 140/80   Pulse Readings from Last 3 Encounters:  02/22/21 81  01/25/21 85  01/17/21 (!) 111    Renal function: CrCl cannot be calculated (Patient's most recent lab result is older than the maximum 21 days allowed.).  Past Medical History:  Diagnosis Date   Anemia, unspecified    Arthritis    Diverticulosis of colon (without mention of hemorrhage)    Gastric ulcer with hemorrhage and obstruction 2011   [facility]   GERD (gastroesophageal reflux disease)    hx   Hyperlipemia    Hypertension    Personal history of colonic polyps 01/30/2010   hyperplastic rectal   Sleep apnea    on CPAP; [facility]   Stroke [facility])    mini strokes-bilateral  carotid endarterectomy    Current Outpatient Medications on File Prior to Visit  Medication Sig Dispense Refill   acetaminophen (TYLENOL) 325 MG tablet Take 650 mg by mouth every 6 (six) hours as needed for moderate pain or headache.     atorvastatin (LIPITOR) 80 MG tablet TAKE 1 TABLET BY MOUTH DAILY 90 tablet 3   Carboxymethylcellul-Glycerin (LUBRICATING EYE DROPS OP) Place 1 drop into both eyes daily as needed (dry eyes).     carvedilol (COREG) 6.25 MG tablet Take 2 tablets (12.5 mg total) by mouth 2 (two) times daily. 180 tablet 3   doxylamine, Sleep, (UNISOM) 25 MG tablet Take 25 mg by mouth at bedtime as needed for sleep.     ezetimibe (ZETIA) 10 MG tablet Take 1 tablet (10 mg total) by mouth daily. 90 tablet 3   Multiple Vitamin (MULTIVITAMIN WITH MINERALS) TABS tablet Take 1 tablet by mouth daily.     sacubitril-valsartan (ENTRESTO) 24-26 MG Take 1 tablet by mouth 2 (two) times daily. 60 tablet 1   No current facility-administered medications on file prior to visit.    Allergies  Allergen Reactions   Adderall [Amphetamine-Dextroamphet Er] Diarrhea   Prozac [Fluoxetine Hcl] Diarrhea   Sertraline Hcl Diarrhea   Advil [Ibuprofen] Diarrhea     Assessment/Plan:  1. CHF - LVEF 40-45% on 11/2020 echo with room to optimize CHF meds. Will increase carvedilol to '25mg'$  BID. Checking BMET today with recent Entresto start. If labs are stable, will increase Entresto to 49-'51mg'$  BID. Will schedule f/u when labs result tomorrow. Plan to continue with Entresto titration and add SGLT2i for CHF/CKD/ASCVD benefit as well at future visit. Pt encouraged to limit sodium and caffeine in his diet and increase walking to help with BP control.  2. Hyperlipidemia - LDL 88 on 12/19/20, goal < 70 due to CAD and PAD, ideally < 55 if possible. Added ezetimibe '10mg'$  daily at last visit 2 weeks ago which he's tolerating well. Continued atorvastatin '80mg'$  daily. Will plan to recheck lipids in 4-6 weeks.  Dian Minahan E.  Lunabelle Oatley, PharmD, BCACP, [facility] [id number] [place], [place] [address] Phone: [phone]; Fax: [phone] 03/09/2021 7:14 AM

## 2021-03-10 ENCOUNTER — Telehealth: Payer: Self-pay | Admitting: Pharmacist

## 2021-03-10 MED ORDER — ENTRESTO 49-51 MG PO TABS: 1.0000 | ORAL_TABLET | Freq: Two times a day (BID) | ORAL | 2 refills | Status: DC

## 2021-03-10 NOTE — Telephone Encounter (Signed)
BMET stable on low dose Entresto, BP remained elevated at visit yesterday. Left message for pt, will plan to increase Entresto to 49-'51mg'$  BID and will need follow up BP check/BMET scheduled within 2 weeks.

## 2021-03-10 NOTE — Telephone Encounter (Addendum)
Pt called clinic back. He is agreeable to dose increase of Entresto. Still has 32 tabs left of Entresto 24-'26mg'$ . Advised he can take 2 tabs BID to use these up which will last him about a week. He's aware to then pick up new rx for 49-'51mg'$  dose of Entresto and take 1 tablet twice daily. Reviewed dose increase of carvedilol as well again with pt.  Scheduled follow up in clinic in 2 weeks for BP check and BMET. Will continue with Entresto dose titration at that time if lab work is stable, as well as addition of SGLT2i for CHF/CKD/ASCVD benefit.

## 2021-03-14 ENCOUNTER — Ambulatory Visit: Payer: Medicare Other | Admitting: Cardiovascular Disease

## 2021-03-28 ENCOUNTER — Other Ambulatory Visit: Payer: Self-pay

## 2021-03-28 ENCOUNTER — Ambulatory Visit (INDEPENDENT_AMBULATORY_CARE_PROVIDER_SITE_OTHER): Payer: Medicare Other | Admitting: Pharmacist

## 2021-03-28 VITALS — BP 148/90 | HR 78 | Wt 209.0 lb

## 2021-03-28 DIAGNOSIS — I6523 Occlusion and stenosis of bilateral carotid arteries: Secondary | ICD-10-CM

## 2021-03-28 DIAGNOSIS — I10 Essential (primary) hypertension: Secondary | ICD-10-CM | POA: Diagnosis not present

## 2021-03-28 DIAGNOSIS — I5022 Chronic systolic (congestive) heart failure: Secondary | ICD-10-CM

## 2021-03-28 LAB — BASIC METABOLIC PANEL
BUN/Creatinine Ratio: 13 (ref 10–24)
BUN: 21 mg/dL (ref 8–27)
CO2: 23 mmol/L (ref 20–29)
Calcium: 9.5 mg/dL (ref 8.6–10.2)
Chloride: 104 mmol/L (ref 96–106)
Creatinine, Ser: 1.65 mg/dL — ABNORMAL HIGH (ref 0.76–1.27)
Glucose: 80 mg/dL (ref 65–99)
Potassium: 4.8 mmol/L (ref 3.5–5.2)
Sodium: 142 mmol/L (ref 134–144)
eGFR: 47 mL/min/{1.73_m2} — ABNORMAL LOW (ref >59)

## 2021-03-28 NOTE — Progress Notes (Signed)
Patient ID: Martin Archer                 DOB: May 09, 1961                      MRN: [id number]     HPI: Jakylin Honan Onstad is a 60 y.o. male referred by Dr. Irish Lack to pharmacy clinic for HF medication management. PMH is significant for HTN, CKD, PAD with bilateral carotid endarterectomies, GI bleed, anemia, and mildly decreased LVEF of 40-45% on 11/2020 echo with grade III diastolic dysfunction and elevated LVEDP. He underwent R/L heart cath 01/25/21 that showed 100% prox RCA stenosis, 70% 3rd mrg stenosis, 25% 1st diag stenosis, mild pulmonary HTN. LV dysfunction out of proportion to degree of CAD, medication therapy for HF recommended. I pt seen on 02/22/21 when BP was elevated at 148/100. Carvedilol was increased, amlodipine was stopped, and Entresto was started for CHF. Ezetimibe was also started to target LDL goal < 70. At last visit on 7/14 blood pressure was 152/104. Carvedilol was increased to '25mg'$  BID and Entresto was increased to 49/'51mg'$  BID.   Pt presents today in good spirits. Reports tolerating medications well, no side effects. He has occasional LLE which resolves on its own. Tends to happen when he is outside in the heat. Does not check weight daily. Reports eating more carbs lately. Trying to adhere to low sodium diet. Denies dizziness, SOB, headache. Recently purchased a bicep BP cuff a few months ago, home readings ranging 130-140/100, HR 75. Did not bring home cuff today, previous visit was 15-17mHg higher than clinic reading. Pt advised to check BP in his right arm only due to blockage. Branded copays affordable ($4 for Entresto due to both Medicare and Medicaid insurance coverage).  Current CHF/HTN meds:  Entresto 49-'51mg'$  BID - 9am, 9pm Carvedilol '25mg'$  BID - 9am, 9pm  BP goal: <130/882mg  Family History: The patient's family history includes Cancer in his maternal grandfather; Diabetes in his father and paternal grandfather; Heart attack in his mother; Heart attack (age of  onset: 5932in his brother; Heart attack (age of onset: 7650in his maternal grandfather; Hypertension in his father; Stroke in his father and paternal uncle.   Social History: The patient  reports that he quit smoking about 5 years ago. His smoking use included cigarettes. He smoked 0.50 packs per day. He has never used smokeless tobacco. He reports previous alcohol use. He reports that he does not use drugs.   Diet: Breakfast - fruit, toast or bacon. Lunch - sandwich (tuna salad, chicken salad, tuKuwaitham with cheese, tomato and mayo). Dinner - beef or chicken, vegetable. Snacks - ice cream. Does not add salt to food regularly. 1 cup of coffee in AM - half caff, 1 glass of unsweet tea with sugar substitute, occasionally a soda, drinks a lot of water - does add energy substitute to it. 2-3 servings of caffeine a day.   Exercise: Plays golf, walking 1/2 mile every other day with plans to increase, trying to increase his stamina  Home BP readings: 130-140/100 HR 75-80  Wt Readings from Last 3 Encounters:  03/09/21 207 lb (93.9 kg)  02/22/21 206 lb (93.4 kg)  01/25/21 200 lb (90.7 kg)   BP Readings from Last 3 Encounters:  03/09/21 (!) 152/104  02/22/21 (!) 148/100  01/25/21 118/87   Pulse Readings from Last 3 Encounters:  03/09/21 82  02/22/21 81  01/25/21 85    Renal function: CrCl  cannot be calculated (Unknown ideal weight.).  Past Medical History:  Diagnosis Date   Anemia, unspecified    Arthritis    Diverticulosis of colon (without mention of hemorrhage)    Gastric ulcer with hemorrhage and obstruction 2011   Dr [facility]   GERD (gastroesophageal reflux disease)    hx   Hyperlipemia    Hypertension    Personal history of colonic polyps 01/30/2010   hyperplastic rectal   Sleep apnea    on CPAP; [facility]   Stroke [facility])    mini strokes-bilateral carotid endarterectomy    Current Outpatient Medications on File Prior to Visit  Medication Sig  Dispense Refill   acetaminophen (TYLENOL) 325 MG tablet Take 650 mg by mouth every 6 (six) hours as needed for moderate pain or headache.     atorvastatin (LIPITOR) 80 MG tablet TAKE 1 TABLET BY MOUTH DAILY 90 tablet 3   Carboxymethylcellul-Glycerin (LUBRICATING EYE DROPS OP) Place 1 drop into both eyes daily as needed (dry eyes).     carvedilol (COREG) 25 MG tablet Take 1 tablet (25 mg total) by mouth 2 (two) times daily. 180 tablet 3   doxylamine, Sleep, (UNISOM) 25 MG tablet Take 25 mg by mouth at bedtime as needed for sleep.     ezetimibe (ZETIA) 10 MG tablet Take 1 tablet (10 mg total) by mouth daily. 90 tablet 3   Multiple Vitamin (MULTIVITAMIN WITH MINERALS) TABS tablet Take 1 tablet by mouth daily.     sacubitril-valsartan (ENTRESTO) 49-51 MG Take 1 tablet by mouth 2 (two) times daily. 60 tablet 2   No current facility-administered medications on file prior to visit.    Allergies  Allergen Reactions   Adderall [Amphetamine-Dextroamphet Er] Diarrhea   Prozac [Fluoxetine Hcl] Diarrhea   Sertraline Hcl Diarrhea   Advil [Ibuprofen] Diarrhea     Assessment/Plan:  1. CHF - LVEF 40-45% on 11/2020 echo with room to optimize CHF meds. BP is above goal of <130/80. Checking BMET today with recent Entresto dose increase. If labs are stable, will increase Entresto to 97/103 mg BID and plan to add SGLT2i for CHF/CKD/ASCVD benefit as well at future visit. Can also use spironolactone if needs further BP lowering. Pt encouraged to limit sodium and weight himself daily at home. I have asked him to bring his home blood pressure cuff to next visit to verify accuracy again. Follow up in clinic in 2.5 weeks scheduled.  2. Hyperlipidemia - LDL 88 on 12/19/20, goal < 70 due to CAD and PAD, ideally < 55 if possible. Added ezetimibe '10mg'$  daily a little over a month ago which he's tolerating well. Continued atorvastatin '80mg'$  daily. Will plan to recheck lipids at next apt.  Ramond Dial, Pharm.D, BCPS,  [facility] [place]  [id number] [place], [place] [address]  Phone: [phone]; Fax: [phone]  03/28/2021 6:53 AM

## 2021-03-28 NOTE — Patient Instructions (Addendum)
I will call you tomorrow with your lab work and medication plan  Please bring your blood pressure cuff with you to next visit  Call me at [phone] with any questions  Start weighing yourself every day. Please call us if you gain 3 or more pounds overnight or 5 lb in one week.

## 2021-03-29 ENCOUNTER — Telehealth: Payer: Self-pay | Admitting: Pharmacist

## 2021-03-29 MED ORDER — EMPAGLIFLOZIN 10 MG PO TABS: 10.0000 mg | ORAL_TABLET | Freq: Every day | ORAL | 3 refills | Status: DC

## 2021-03-29 MED ORDER — ENTRESTO 97-103 MG PO TABS: 1.0000 | ORAL_TABLET | Freq: Two times a day (BID) | ORAL | 3 refills | Status: DC

## 2021-03-29 NOTE — Telephone Encounter (Signed)
Spoke with patient and reviewed results and medication plan. Patient in agreement.

## 2021-03-29 NOTE — Telephone Encounter (Signed)
BMP stable after increasing Entresto. Will go ahead and increase Entresto to max dose 97/'103mg'$  BID and add Jardiance '10mg'$  daily. LVM for patient to call back

## 2021-04-11 ENCOUNTER — Encounter: Payer: Self-pay | Admitting: Cardiovascular Disease

## 2021-04-11 ENCOUNTER — Other Ambulatory Visit: Payer: Self-pay

## 2021-04-11 ENCOUNTER — Ambulatory Visit (INDEPENDENT_AMBULATORY_CARE_PROVIDER_SITE_OTHER): Payer: Medicare Other | Admitting: Cardiovascular Disease

## 2021-04-11 ENCOUNTER — Other Ambulatory Visit (HOSPITAL_COMMUNITY): Payer: Self-pay | Admitting: Cardiovascular Disease

## 2021-04-11 VITALS — BP 125/60 | HR 79 | Ht 68.0 in | Wt 207.4 lb

## 2021-04-11 DIAGNOSIS — Z01812 Encounter for preprocedural laboratory examination: Secondary | ICD-10-CM

## 2021-04-11 DIAGNOSIS — Z01818 Encounter for other preprocedural examination: Secondary | ICD-10-CM

## 2021-04-11 DIAGNOSIS — I739 Peripheral vascular disease, unspecified: Secondary | ICD-10-CM

## 2021-04-11 DIAGNOSIS — I6523 Occlusion and stenosis of bilateral carotid arteries: Secondary | ICD-10-CM

## 2021-04-11 DIAGNOSIS — I5022 Chronic systolic (congestive) heart failure: Secondary | ICD-10-CM

## 2021-04-11 DIAGNOSIS — I700 Atherosclerosis of aorta: Secondary | ICD-10-CM

## 2021-04-11 DIAGNOSIS — I2510 Atherosclerotic heart disease of native coronary artery without angina pectoris: Secondary | ICD-10-CM

## 2021-04-11 DIAGNOSIS — E785 Hyperlipidemia, unspecified: Secondary | ICD-10-CM

## 2021-04-11 DIAGNOSIS — I771 Stricture of artery: Secondary | ICD-10-CM

## 2021-04-11 LAB — BASIC METABOLIC PANEL
BUN/Creatinine Ratio: 16 (ref 10–24)
BUN: 27 mg/dL (ref 8–27)
CO2: 23 mmol/L (ref 20–29)
Calcium: 9.4 mg/dL (ref 8.6–10.2)
Chloride: 102 mmol/L (ref 96–106)
Creatinine, Ser: 1.72 mg/dL — ABNORMAL HIGH (ref 0.76–1.27)
Glucose: 78 mg/dL (ref 65–99)
Potassium: 5.3 mmol/L — ABNORMAL HIGH (ref 3.5–5.2)
Sodium: 140 mmol/L (ref 134–144)
eGFR: 44 mL/min/{1.73_m2} — ABNORMAL LOW (ref >59)

## 2021-04-11 LAB — CBC
Hematocrit: 44.9 % (ref 37.5–51.0)
Hemoglobin: 15.3 g/dL (ref 13.0–17.7)
MCH: 29.9 pg (ref 26.6–33.0)
MCHC: 34.1 g/dL (ref 31.5–35.7)
MCV: 88 fL (ref 79–97)
Platelets: 257 10*3/uL (ref 150–450)
RBC: 5.11 x10E6/uL (ref 4.14–5.80)
RDW: 14.5 % (ref 11.6–15.4)
WBC: 11.4 10*3/uL — ABNORMAL HIGH (ref 3.4–10.8)

## 2021-04-11 MED ORDER — ASPIRIN EC 81 MG PO TBEC: 81.0000 mg | DELAYED_RELEASE_TABLET | Freq: Every day | ORAL | 11 refills | Status: AC

## 2021-04-11 NOTE — Progress Notes (Signed)
Cardiology Office Note   Date:  04/11/2021   ID:  Martin Archer, DOB Feb 21, 1961, MRN [id number]  PCP:  Biagio Borg, MD  Cardiologist: Dr. Irish Lack  No chief complaint on file.     History of Present Illness: Martin Archer is a 60 y.o. male who was referred by Dr. Irish Lack for evaluation management of left subclavian artery stenosis. He has known history of essential hypertension, previous tobacco use, carotid artery disease status post bilateral endarterectomy, chronic systolic heart failure, history of anemia due to GI bleed, chronic kidney disease He had an echocardiogram done in April which showed an ejection fraction of 40 to 45% with global hypokinesis, severely dilated atria, mild mitral regurgitation mildly calcified aortic valve.  He underwent a right and left cardiac catheterization in June of this year which showed chronically occluded proximal right coronary artery with left-to-right collaterals and mild nonobstructive disease on the left side.  Medical therapy was recommended.  Carotid Doppler done in May showed an occluded left carotid artery with moderate disease on the right side.  Significant stenosis noted in the left subclavian artery with 37 mm of pressure difference between the arms and retrograde flow in the left vertebral artery.  He uses both arms equally and reports significant discomfort in the left arm with exertion.  He gets numbness and weakness with inability to lift his left arm.  This is usually associated with dizziness. In addition, he complains of numbness in both feet and leg aching on both sides with exertion. No chest pain or shortness of breath. He is not diabetic and quit smoking in October of last year.  Past Medical History:  Diagnosis Date   Anemia, unspecified    Arthritis    Diverticulosis of colon (without mention of hemorrhage)    Gastric ulcer with hemorrhage and obstruction 2011   Dr [facility]   GERD  (gastroesophageal reflux disease)    hx   Hyperlipemia    Hypertension    Personal history of colonic polyps 01/30/2010   hyperplastic rectal   Sleep apnea    on CPAP;  [facility]   Stroke [facility])    mini strokes-bilateral carotid endarterectomy    Past Surgical History:  Procedure Laterality Date   CAROTID ENDARTERECTOMY  11/2006   left Dr Mamie Nick   COLONOSCOPY  2011   DP-positive FOBT/IDA   ENDARTERECTOMY Right 01/19/2015   Procedure: RIGHT CAROTID ENDARTERECTOMY WITH PATCH ANGIOPLASTY;  Surgeon: Mal Misty, MD;  Location: [facility] OR;  Service: Vascular;  Laterality: Right;   ESOPHAGOGASTRODUODENOSCOPY Left 06/17/2013   Procedure: ESOPHAGOGASTRODUODENOSCOPY (EGD);  Surgeon: Arta Silence, MD;  Location: [facility] ENDOSCOPY;  Service: Endoscopy;  Laterality: Left;   ESOPHAGOGASTRODUODENOSCOPY N/A 09/20/2015   Procedure: ESOPHAGOGASTRODUODENOSCOPY (EGD);  Surgeon: Ladene Artist, MD;  Location: [facility] ENDOSCOPY;  Service: Endoscopy;  Laterality: N/A;   FOOT SURGERY Bilateral 2020   Taylor's foot   INGUINAL HERNIA REPAIR  1963   NASAL FRACTURE SURGERY  1974   RIGHT/LEFT HEART CATH AND CORONARY ANGIOGRAPHY N/A 01/25/2021   Procedure: RIGHT/LEFT HEART CATH AND CORONARY ANGIOGRAPHY;  Surgeon: Jettie Booze, MD;  Location: [facility] LAB;  Service: Cardiovascular;  Laterality: N/A;   TEE WITHOUT CARDIOVERSION N/A 01/17/2015   Procedure: TRANSESOPHAGEAL ECHOCARDIOGRAM (TEE);  Surgeon: Sanda Klein, MD;  Location: [facility] ENDOSCOPY;  Service: Cardiovascular;  Laterality: N/A;   WISDOM TOOTH EXTRACTION       Current Outpatient Medications  Medication Sig Dispense Refill  acetaminophen (TYLENOL) 325 MG tablet Take 650 mg by mouth every 6 (six) hours as needed for moderate pain or headache.     aspirin EC 81 MG tablet Take 1 tablet (81 mg total) by mouth daily. Swallow whole. 30 tablet 11   atorvastatin (LIPITOR) 80 MG tablet TAKE 1 TABLET BY MOUTH DAILY 90 tablet 3    Carboxymethylcellul-Glycerin (LUBRICATING EYE DROPS OP) Place 1 drop into both eyes daily as needed (dry eyes).     carvedilol (COREG) 25 MG tablet Take 1 tablet (25 mg total) by mouth 2 (two) times daily. 180 tablet 3   doxylamine, Sleep, (UNISOM) 25 MG tablet Take 25 mg by mouth at bedtime as needed for sleep.     empagliflozin (JARDIANCE) 10 MG TABS tablet Take 1 tablet (10 mg total) by mouth daily before breakfast. 90 tablet 3   ezetimibe (ZETIA) 10 MG tablet Take 1 tablet (10 mg total) by mouth daily. 90 tablet 3   Multiple Vitamin (MULTIVITAMIN WITH MINERALS) TABS tablet Take 1 tablet by mouth daily.     sacubitril-valsartan (ENTRESTO) 97-103 MG Take 1 tablet by mouth 2 (two) times daily. 180 tablet 3   No current facility-administered medications for this visit.    Allergies:   Adderall [amphetamine-dextroamphet er], Prozac [fluoxetine hcl], Sertraline hcl, and Advil [ibuprofen]    Social History:  The patient  reports that he quit smoking about 5 years ago. His smoking use included cigarettes. He smoked an average of .5 packs per day. He has never used smokeless tobacco. He reports that he does not currently use alcohol. He reports that he does not use drugs.   Family History:  The patient's family history includes Cancer in his maternal grandfather; Diabetes in his father and paternal grandfather; Heart attack in his mother; Heart attack (age of onset: 44) in his brother; Heart attack (age of onset: 58) in his maternal grandfather; Hypertension in his father; Stroke in his father and paternal uncle.    ROS:  Please see the history of present illness.   Otherwise, review of systems are positive for none.   All other systems are reviewed and negative.    PHYSICAL EXAM: VS:  BP 125/60   Pulse 79   Ht '5\' 8"'$  (1.727 m)   Wt 207 lb 6.4 oz (94.1 kg)   SpO2 93%   BMI 31.54 kg/m  , BMI Body mass index is 31.54 kg/m. GEN: Well nourished, well developed, in no acute distress  HEENT:  normal  Neck: no JVD, carotid bruits, or masses Cardiac: RRR; no murmurs, rubs, or gallops,no edema  Respiratory:  clear to auscultation bilaterally, normal work of breathing GI: soft, nontender, nondistended, + BS MS: no deformity or atrophy  Skin: warm and dry, no rash Neuro:  Strength and sensation are intact Psych: euthymic mood, full affect Vascular: Radial pulses +1 on the right and 0 on the left, brachial: +1 on the right and 0 on the left.  Femoral: +1 bilaterally.  Distal pulses are not palpable with some cyanosis in the toes.   EKG:  EKG is not ordered today.    Recent Labs: 12/19/2020: ALT 34; TSH 1.76 01/17/2021: Platelets 292 01/25/2021: Hemoglobin 14.3; Hemoglobin 14.3 03/28/2021: BUN 21; Creatinine, Ser 1.65; Potassium 4.8; Sodium 142    Lipid Panel    Component Value Date/Time   CHOL 157 12/19/2020 1200   CHOL 203 (H) 03/25/2015 0938   CHOL 286 (H) 10/12/2013 0436   TRIG 161.0 (H) 12/19/2020 1200  TRIG 572 (H) 10/12/2013 0436   TRIG 284 (HH) 07/15/2006 1158   HDL 37.40 (L) 12/19/2020 1200   HDL 52 03/25/2015 0938   HDL 35 (L) 10/12/2013 0436   CHOLHDL 4 12/19/2020 1200   VLDL 32.2 12/19/2020 1200   LDLCALC 88 12/19/2020 1200   LDLCALC  03/23/2020 1601     Comment:     . LDL cholesterol not calculated. Triglyceride levels greater than 400 mg/dL invalidate calculated LDL results. . Reference range: <100 . Desirable range <100 mg/dL for primary prevention;   <70 mg/dL for patients with CHD or diabetic patients  with > or = 2 CHD risk factors. Marland Kitchen LDL-C is now calculated using the Martin-Hopkins  calculation, which is a validated novel method providing  better accuracy than the Friedewald equation in the  estimation of LDL-C.  Cresenciano Genre et al. Annamaria Helling. [id number]): 2061-2068  (http://education.[employer].com/faq/FAQ164)    LDLCALC NOT CALC 10/12/2013 0436   LDLDIRECT 90.0 09/26/2017 1715      Wt Readings from Last 3 Encounters:  04/11/21 207 lb  6.4 oz (94.1 kg)  03/28/21 209 lb (94.8 kg)  03/09/21 207 lb (93.9 kg)       No flowsheet data found.    ASSESSMENT AND PLAN:  1.  Left subclavian artery stenosis: The patient has evidence of severe left subclavian stenosis.  He has no palpable pulses in the left arm and he is symptomatic with significant claudication and symptoms of steal syndrome. Due to this, I recommend proceeding with aortic arch angiogram with left subclavian angiography and possible endovascular intervention.  I discussed the procedure in details as well as risks and benefits. He does have underlying chronic kidney disease and thus we will hydrate him for 4 hours before the procedure. Start aspirin 81 mg once daily.  2.  Carotid artery stenosis: Occluded left carotid artery with moderate disease affecting the right carotid artery.  Repeat carotid Doppler in 1 year.  3.  Coronary artery disease involving native coronary arteries without angina: He has no anginal symptoms.  Continue medical therapy.  4.  Chronic systolic heart failure: He appears to be euvolemic and currently on carvedilol, Entresto and Jardiance.  5.  Peripheral arterial disease: The patient has evidence of bilateral leg claudication with abnormal lower extremity pulses including femoral pulses.  Suspect that he has inflow disease.  I requested lower extremity arterial Doppler and aortoiliac duplex.    Disposition:   FU with me in 1 month  Signed,  Kathlyn Sacramento, MD  04/11/2021 1:31 PM    [facility]

## 2021-04-11 NOTE — H&P (View-Only) (Signed)
Cardiology Office Note   Date:  04/11/2021   ID:  Martin Archer, DOB [birth year], MRN [id number]  PCP:  Biagio Borg, MD  Cardiologist: Dr. Irish Lack  No chief complaint on file.     History of Present Illness: Martin Archer is a 60 y.o. male who was referred by Dr. Irish Lack for evaluation management of left subclavian artery stenosis. He has known history of essential hypertension, previous tobacco use, carotid artery disease status post bilateral endarterectomy, chronic systolic heart failure, history of anemia due to GI bleed, chronic kidney disease He had an echocardiogram done in April which showed an ejection fraction of 40 to 45% with global hypokinesis, severely dilated atria, mild mitral regurgitation mildly calcified aortic valve.  He underwent a right and left cardiac catheterization in June of this year which showed chronically occluded proximal right coronary artery with left-to-right collaterals and mild nonobstructive disease on the left side.  Medical therapy was recommended.  Carotid Doppler done in May showed an occluded left carotid artery with moderate disease on the right side.  Significant stenosis noted in the left subclavian artery with 37 mm of pressure difference between the arms and retrograde flow in the left vertebral artery.  He uses both arms equally and reports significant discomfort in the left arm with exertion.  He gets numbness and weakness with inability to lift his left arm.  This is usually associated with dizziness. In addition, he complains of numbness in both feet and leg aching on both sides with exertion. No chest pain or shortness of breath. He is not diabetic and quit smoking in October of last year.  Past Medical History:  Diagnosis Date   Anemia, unspecified    Arthritis    Diverticulosis of colon (without mention of hemorrhage)    Gastric ulcer with hemorrhage and obstruction 2011   Dr F[facility]   GERD  (gastroesophageal reflux disease)    hx   Hyperlipemia    Hypertension    Personal history of colonic polyps 01/30/2010   hyperplastic rectal   Sleep apnea    on CPAP; [facility]   Stroke [facility])    mini strokes-bilateral carotid endarterectomy    Past Surgical History:  Procedure Laterality Date   CAROTID ENDARTERECTOMY  11/2006   left Dr Mamie Nick   COLONOSCOPY  2011   DP-positive FOBT/IDA   ENDARTERECTOMY Right 01/19/2015   Procedure: RIGHT CAROTID ENDARTERECTOMY WITH PATCH ANGIOPLASTY;  Surgeon: Mal Misty, MD;  Location[facility] OR;  Service: Vascular;  Laterality: Right;   ESOPHAGOGASTRODUODENOSCOPY Left 06/17/2013   Procedure: ESOPHAGOGASTRODUODENOSCOPY (EGD);  Surgeon: Arta Silence, MD;  [facility] ENDOSCOPY;  Service: Endoscopy;  Laterality: Left;   ESOPHAGOGASTRODUODENOSCOPY N/A 09/20/2015   Procedure: ESOPHAGOGASTRODUODENOSCOPY (EGD);  Surgeon: Ladene Artist, MD;  Location: [facility] ENDOSCOPY;  Service: Endoscopy;  Laterality: N/A;   FOOT SURGERY Bilateral 2020   Taylor's foot   INGUINAL HERNIA REPAIR  1963   NASAL FRACTURE SURGERY  1974   RIGHT/LEFT HEART CATH AND CORONARY ANGIOGRAPHY N/A 01/25/2021   Procedure: RIGHT/LEFT HEART CATH AND CORONARY ANGIOGRAPHY;  Surgeon: Jettie Booze, MD;  Location: [place] CV LAB;  Service: Cardiovascular;  Laterality: N/A;   TEE WITHOUT CARDIOVERSION N/A 01/17/2015   Procedure: TRANSESOPHAGEAL ECHOCARDIOGRAM (TEE);  Surgeon: Sanda Klein, MD;  Location: [facility] ENDOSCOPY;  Service: Cardiovascular;  Laterality: N/A;   WISDOM TOOTH EXTRACTION       Current Outpatient Medications  Medication Sig Dispense Refill  acetaminophen (TYLENOL) 325 MG tablet Take 650 mg by mouth every 6 (six) hours as needed for moderate pain or headache.     aspirin EC 81 MG tablet Take 1 tablet (81 mg total) by mouth daily. Swallow whole. 30 tablet 11   atorvastatin (LIPITOR) 80 MG tablet TAKE 1 TABLET BY MOUTH DAILY 90 tablet 3    Carboxymethylcellul-Glycerin (LUBRICATING EYE DROPS OP) Place 1 drop into both eyes daily as needed (dry eyes).     carvedilol (COREG) 25 MG tablet Take 1 tablet (25 mg total) by mouth 2 (two) times daily. 180 tablet 3   doxylamine, Sleep, (UNISOM) 25 MG tablet Take 25 mg by mouth at bedtime as needed for sleep.     empagliflozin (JARDIANCE) 10 MG TABS tablet Take 1 tablet (10 mg total) by mouth daily before breakfast. 90 tablet 3   ezetimibe (ZETIA) 10 MG tablet Take 1 tablet (10 mg total) by mouth daily. 90 tablet 3   Multiple Vitamin (MULTIVITAMIN WITH MINERALS) TABS tablet Take 1 tablet by mouth daily.     sacubitril-valsartan (ENTRESTO) 97-103 MG Take 1 tablet by mouth 2 (two) times daily. 180 tablet 3   No current facility-administered medications for this visit.    Allergies:   Adderall [amphetamine-dextroamphet er], Prozac [fluoxetine hcl], Sertraline hcl, and Advil [ibuprofen]    Social History:  The patient  reports that he quit smoking about 5 years ago. His smoking use included cigarettes. He smoked an average of .5 packs per day. He has never used smokeless tobacco. He reports that he does not currently use alcohol. He reports that he does not use drugs.   Family History:  The patient's family history includes Cancer in his maternal grandfather; Diabetes in his father and paternal grandfather; Heart attack in his mother; Heart attack (age of onset: 72) in his brother; Heart attack (age of onset: 32) in his maternal grandfather; Hypertension in his father; Stroke in his father and paternal uncle.    ROS:  Please see the history of present illness.   Otherwise, review of systems are positive for none.   All other systems are reviewed and negative.    PHYSICAL EXAM: VS:  BP 125/60   Pulse 79   Ht '5\' 8"'$  (1.727 m)   Wt 207 lb 6.4 oz (94.1 kg)   SpO2 93%   BMI 31.54 kg/m  , BMI Body mass index is 31.54 kg/m. GEN: Well nourished, well developed, in no acute distress  HEENT:  normal  Neck: no JVD, carotid bruits, or masses Cardiac: RRR; no murmurs, rubs, or gallops,no edema  Respiratory:  clear to auscultation bilaterally, normal work of breathing GI: soft, nontender, nondistended, + BS MS: no deformity or atrophy  Skin: warm and dry, no rash Neuro:  Strength and sensation are intact Psych: euthymic mood, full affect Vascular: Radial pulses +1 on the right and 0 on the left, brachial: +1 on the right and 0 on the left.  Femoral: +1 bilaterally.  Distal pulses are not palpable with some cyanosis in the toes.   EKG:  EKG is not ordered today.    Recent Labs: 12/19/2020: ALT 34; TSH 1.76 01/17/2021: Platelets 292 01/25/2021: Hemoglobin 14.3; Hemoglobin 14.3 03/28/2021: BUN 21; Creatinine, Ser 1.65; Potassium 4.8; Sodium 142    Lipid Panel    Component Value Date/Time   CHOL 157 12/19/2020 1200   CHOL 203 (H) 03/25/2015 0938   CHOL 286 (H) 10/12/2013 0436   TRIG 161.0 (H) 12/19/2020 1200  TRIG 572 (H) 10/12/2013 0436   TRIG 284 (HH) 07/15/2006 1158   HDL 37.40 (L) 12/19/2020 1200   HDL 52 03/25/2015 0938   HDL 35 (L) 10/12/2013 0436   CHOLHDL 4 12/19/2020 1200   VLDL 32.2 12/19/2020 1200   LDLCALC 88 12/19/2020 1200   LDLCALC  03/23/2020 1601     Comment:     . LDL cholesterol not calculated. Triglyceride levels greater than 400 mg/dL invalidate calculated LDL results. . Reference range: <100 . Desirable range <100 mg/dL for primary prevention;   <70 mg/dL for patients with CHD or diabetic patients  with > or = 2 CHD risk factors. Marland Kitchen LDL-C is now calculated using the Martin-Hopkins  calculation, which is a validated novel method providing  better accuracy than the Friedewald equation in the  estimation of LDL-C.  Cresenciano Genre et al. Annamaria Helling. [id number]): 2061-2068  (http://education.[employer].com/faq/FAQ164)    LDLCALC NOT CALC 10/12/2013 0436   LDLDIRECT 90.0 09/26/2017 1715      Wt Readings from Last 3 Encounters:  04/11/21 207 lb  6.4 oz (94.1 kg)  03/28/21 209 lb (94.8 kg)  03/09/21 207 lb (93.9 kg)       No flowsheet data found.    ASSESSMENT AND PLAN:  1.  Left subclavian artery stenosis: The patient has evidence of severe left subclavian stenosis.  He has no palpable pulses in the left arm and he is symptomatic with significant claudication and symptoms of steal syndrome. Due to this, I recommend proceeding with aortic arch angiogram with left subclavian angiography and possible endovascular intervention.  I discussed the procedure in details as well as risks and benefits. He does have underlying chronic kidney disease and thus we will hydrate him for 4 hours before the procedure. Start aspirin 81 mg once daily.  2.  Carotid artery stenosis: Occluded left carotid artery with moderate disease affecting the right carotid artery.  Repeat carotid Doppler in 1 year.  3.  Coronary artery disease involving native coronary arteries without angina: He has no anginal symptoms.  Continue medical therapy.  4.  Chronic systolic heart failure: He appears to be euvolemic and currently on carvedilol, Entresto and Jardiance.  5.  Peripheral arterial disease: The patient has evidence of bilateral leg claudication with abnormal lower extremity pulses including femoral pulses.  Suspect that he has inflow disease.  I requested lower extremity arterial Doppler and aortoiliac duplex.    Disposition:   FU with me in 1 month  Signed,  Kathlyn Sacramento, MD  04/11/2021 1:31 PM    [facility]

## 2021-04-11 NOTE — Patient Instructions (Signed)
Medication Instructions:  START: ASPIRIN 81 mg DAILY  *If you need a refill on your cardiac medications before your next appointment, please call your pharmacy*  Lab Work: BMET and [place]  If you have labs (blood work) drawn today and your tests are completely normal, you will receive your results only by: [place] (if you have MyChart) OR A paper copy in the mail If you have any lab test that is abnormal or we need to change your treatment, we will call you to review the results.  Testing/Procedures:  Your physician has requested that you have a lower extremity arterial duplex BEFORE AUGUST 31st. During this test, ultrasound is used to evaluate arterial blood flow in the legs. Allow one hour for this exam. There are no restrictions or special instructions. This will take place at [place], [address].  Your physician has requested that you have an Aorta/Iliac Duplex BEFORE AUGUST 31st. This will be take place at [place], [address].  No food after 11PM the night before.  Water is OK. (Don't drink liquids if you have been instructed not to for ANOTHER test). Take two Extra-Strength Gas-X capsules at bedtime the night before test.   Take an additional two Extra-Strength Gas-X capsules three (3) hours before the test or first thing in the morning.   Avoid foods that produce bowel gas, for 24 hours prior to exam (see below).   No breakfast, no chewing gum, no smoking or carbonated beverages. Patient may take morning medications with water. Come in for test at least 15 minutes early to register.  Follow-Up: At [facility], you and your health needs are our priority.  As part of our continuing mission to provide you with exceptional heart care, we have created designated Provider Care Teams.  These Care Teams include your primary Cardiologist (physician) and Advanced Practice Providers (APPs -  Physician Assistants and Nurse Practitioners) who all work together  to provide you with the care you need, when you need it.   Your next appointment:   1 month(s)  The format for your next appointment:   In Person  Provider:   Kathlyn Sacramento, MD  Other Instructions [address] [place] [address] Dept: [phone] Loc: [place]  04/11/2021  You are scheduled for a Peripheral Angiogram on Wednesday, August 31 with Dr. Kathlyn Sacramento.  1. Please arrive at the [facility] (Main Entrance A) at [facility]: [address] [place], [place] [address] at 5:30 AM (This time is two hours before your procedure to ensure your preparation). Free valet parking service is available.   Special note: Every effort is made to have your procedure done on time. Please understand that emergencies sometimes delay scheduled procedures.  2. Diet: Do not eat solid foods after midnight.  The patient may have clear liquids until 5am upon the day of the procedure.  3. Labs: You will need to have blood drawn on Tuesday, August 16 at [place]  Open: 8am - 5pm (Lunch 12:30 - 1:30)   Phone: [phone]. You do not need to be fasting.  4. Medication instructions in preparation for your procedure:   Contrast Allergy: No  *For reference purposes while preparing patient instructions.   Delete this med list prior to printing instructions for patient.*  DO NOT TAKE JARDIANCE THE MORNING OF PROCEDURE   On  the morning of your procedure, take your Aspirin and any morning medicines NOT listed above.  You may use sips of water.  5. Plan for one night stay--bring personal belongings. 6. Bring a current list of your medications and current insurance cards. 7. You MUST have a responsible person to drive you home. 8. Someone MUST be with you the first 24 hours after you arrive home or your discharge will be  delayed. 9. Please wear clothes that are easy to get on and off and wear slip-on shoes.  Thank you for allowing [place] to care for you!   -- [facility]

## 2021-04-13 ENCOUNTER — Other Ambulatory Visit: Payer: Self-pay

## 2021-04-13 ENCOUNTER — Ambulatory Visit (INDEPENDENT_AMBULATORY_CARE_PROVIDER_SITE_OTHER): Payer: Medicare Other | Admitting: Pharmacist

## 2021-04-13 ENCOUNTER — Telehealth: Payer: Self-pay | Admitting: Pharmacist

## 2021-04-13 VITALS — BP 132/80 | HR 71 | Wt 206.6 lb

## 2021-04-13 DIAGNOSIS — I10 Essential (primary) hypertension: Secondary | ICD-10-CM | POA: Diagnosis not present

## 2021-04-13 DIAGNOSIS — I5022 Chronic systolic (congestive) heart failure: Secondary | ICD-10-CM

## 2021-04-13 DIAGNOSIS — E785 Hyperlipidemia, unspecified: Secondary | ICD-10-CM | POA: Diagnosis not present

## 2021-04-13 LAB — COMPREHENSIVE METABOLIC PANEL
ALT: 22 IU/L (ref 0–44)
AST: 24 IU/L (ref 0–40)
Albumin/Globulin Ratio: 2 (ref 1.2–2.2)
Albumin: 4.3 g/dL (ref 3.8–4.8)
Alkaline Phosphatase: 113 IU/L (ref 44–121)
BUN/Creatinine Ratio: 13 (ref 10–24)
BUN: 22 mg/dL (ref 8–27)
Bilirubin Total: 0.5 mg/dL (ref 0.0–1.2)
CO2: 22 mmol/L (ref 20–29)
Calcium: 9.3 mg/dL (ref 8.6–10.2)
Chloride: 103 mmol/L (ref 96–106)
Creatinine, Ser: 1.67 mg/dL — ABNORMAL HIGH (ref 0.76–1.27)
Globulin, Total: 2.1 g/dL (ref 1.5–4.5)
Glucose: 87 mg/dL (ref 65–99)
Potassium: 5 mmol/L (ref 3.5–5.2)
Sodium: 139 mmol/L (ref 134–144)
Total Protein: 6.4 g/dL (ref 6.0–8.5)
eGFR: 46 mL/min/{1.73_m2} — ABNORMAL LOW (ref >59)

## 2021-04-13 LAB — LIPID PANEL
Chol/HDL Ratio: 3.5 ratio (ref 0.0–5.0)
Cholesterol, Total: 124 mg/dL (ref 100–199)
HDL: 35 mg/dL — ABNORMAL LOW (ref >39)
LDL Chol Calc (NIH): 59 mg/dL (ref 0–99)
Triglycerides: 177 mg/dL — ABNORMAL HIGH (ref 0–149)
VLDL Cholesterol Cal: 30 mg/dL (ref 5–40)

## 2021-04-13 MED ORDER — ATORVASTATIN CALCIUM 80 MG PO TABS: ORAL_TABLET | ORAL | 3 refills | Status: DC

## 2021-04-13 MED ORDER — EMPAGLIFLOZIN 10 MG PO TABS
10.0000 mg | ORAL_TABLET | Freq: Every day | ORAL | 3 refills | Status: DC
Start: 2021-04-13 — End: 2022-06-13

## 2021-04-13 MED ORDER — CARVEDILOL 25 MG PO TABS: 25.0000 mg | ORAL_TABLET | Freq: Two times a day (BID) | ORAL | 3 refills | Status: DC

## 2021-04-13 NOTE — Progress Notes (Signed)
Patient ID: Martin Archer                 DOB: 1960/09/26                      MRN: [id number]     HPI: Aristidis Adler Edinger is a 60 y.o. male referred by Dr. Irish Lack to pharmacy clinic for HF medication management. PMH is significant for HTN, CKD, CAD with chronically occluded prox RCA, PAD with bilateral carotid endarterectomies, severe left subclavian artery stenosis, GI bleed, anemia, and mildly decreased LVEF of 40-45% on 11/2020 echo with grade III diastolic dysfunction and elevated LVEDP. He underwent R/L heart cath 01/25/21 that showed 100% prox RCA stenosis, 70% 3rd mrg stenosis, 25% 1st diag stenosis, mild pulmonary HTN. LV dysfunction out of proportion to degree of CAD, medication therapy for HF recommended. At last few visits with PharmD, carvedilol dose has been increased, and Entresto, Jardiance, and ezetimibe have been started.  Pt presents today in good spirits. Reports tolerating medications well. Still seeing some readings at home ~140/80 (home cuff did previously measure 15-2mHg higher than clinic reading). Pressure was well controlled at recent visit with Dr AFletcher Anon2 days ago at 125/60. Notes occasional dizziness. Played golf yesterday and is tired today, frustrated by fatigue as he wants to be more active.  Takes meds at 9am and 9pm, took AM doses today a bit earlier around 7am due to appt. Pt advised to check BP in his right arm only due to blockage. Trying to walk more and golfs, does have some neuropathy in his feet. Historically has noted more LE edema after higher sodium meals. Branded copays affordable ($4 for Entresto due to both Medicare and Medicaid insurance coverage). Pending vascular procedure with Dr AFletcher Anonon 8/31. Had some coffee with creamer this AM, otherwise is fasting.  Current CHF/HTN meds:  Entresto 97-'103mg'$  BID - 9am, 9pm Carvedilol '25mg'$  BID - 9am, 9pm Jardiance '10mg'$  daily - am  BP goal: <130/853mg  Family History: The patient's family history  includes Cancer in his maternal grandfather; Diabetes in his father and paternal grandfather; Heart attack in his mother; Heart attack (age of onset: 5940in his brother; Heart attack (age of onset: 7672in his maternal grandfather; Hypertension in his father; Stroke in his father and paternal uncle.   Social History: The patient  reports that he quit smoking about 5 years ago. His smoking use included cigarettes. He smoked 0.50 packs per day. He has never used smokeless tobacco. He reports previous alcohol use. He reports that he does not use drugs.   Diet: Breakfast - fruit, toast or bacon. Lunch - sandwich (tuna salad, chicken salad, tuKuwaitham with cheese, tomato and mayo). Dinner - beef or chicken, vegetable. Snacks - ice cream. Does not add salt to food regularly. 1 cup of coffee in AM - half caff, 1 glass of unsweet tea with sugar substitute, occasionally a soda, drinks a lot of water - does add energy substitute to it. 2-3 servings of caffeine a day.   Exercise: Plays golf, walking 1/2 mile every other day with plans to increase, trying to increase his stamina  Home BP readings: 140/100  Wt Readings from Last 3 Encounters:  04/11/21 207 lb 6.4 oz (94.1 kg)  03/28/21 209 lb (94.8 kg)  03/09/21 207 lb (93.9 kg)   BP Readings from Last 3 Encounters:  04/11/21 125/60  03/28/21 (!) 148/90  03/09/21 (!) 152/104   Pulse Readings from  Last 3 Encounters:  04/11/21 79  03/28/21 78  03/09/21 82    Renal function: Estimated Creatinine Clearance: 49.6 mL/min (A) (by C-G formula based on SCr of 1.72 mg/dL (H)).  Past Medical History:  Diagnosis Date   Anemia, unspecified    Arthritis    Diverticulosis of colon (without mention of hemorrhage)    Gastric ulcer with hemorrhage and obstruction 2011   Dr [facility]   GERD (gastroesophageal reflux disease)    hx   Hyperlipemia    Hypertension    Personal history of colonic polyps 01/30/2010   hyperplastic rectal   Sleep apnea     on CPAP; [facility]   Stroke [facility])    mini strokes-bilateral carotid endarterectomy    Current Outpatient Medications on File Prior to Visit  Medication Sig Dispense Refill   acetaminophen (TYLENOL) 325 MG tablet Take 650 mg by mouth every 6 (six) hours as needed for moderate pain or headache.     aspirin EC 81 MG tablet Take 1 tablet (81 mg total) by mouth daily. Swallow whole. 30 tablet 11   atorvastatin (LIPITOR) 80 MG tablet TAKE 1 TABLET BY MOUTH DAILY 90 tablet 3   Carboxymethylcellul-Glycerin (LUBRICATING EYE DROPS OP) Place 1 drop into both eyes daily as needed (dry eyes).     carvedilol (COREG) 25 MG tablet Take 1 tablet (25 mg total) by mouth 2 (two) times daily. 180 tablet 3   doxylamine, Sleep, (UNISOM) 25 MG tablet Take 25 mg by mouth at bedtime as needed for sleep.     empagliflozin (JARDIANCE) 10 MG TABS tablet Take 1 tablet (10 mg total) by mouth daily before breakfast. 90 tablet 3   ezetimibe (ZETIA) 10 MG tablet Take 1 tablet (10 mg total) by mouth daily. 90 tablet 3   Multiple Vitamin (MULTIVITAMIN WITH MINERALS) TABS tablet Take 1 tablet by mouth daily.     sacubitril-valsartan (ENTRESTO) 97-103 MG Take 1 tablet by mouth 2 (two) times daily. 180 tablet 3   No current facility-administered medications on file prior to visit.    Allergies  Allergen Reactions   Adderall [Amphetamine-Dextroamphet Er] Diarrhea   Prozac [Fluoxetine Hcl] Diarrhea   Sertraline Hcl Diarrhea   Advil [Ibuprofen] Diarrhea     Assessment/Plan:  1. CHF - LVEF 40-45% on 11/2020 echo, pt now currently taking Entresto 97-'103mg'$  BID, carvedilol '25mg'$  BID, and Jardiance '10mg'$  daily. K had increased to 5.3 on labs a few days ago, will recheck today. Ideally would like to start spironolactone for CHF but potassium may limit this. He is otherwise optimized on his CHF meds and BP well controlled today, very close to goal <130/59mHg.  2. Hyperlipidemia - LDL 88 on 12/19/20, goal < 70 due to CAD  and PAD, ideally < 55 if possible. Rechecking lipids today since ezetimibe '10mg'$  daily was added about 2 months ago. He continues on atorvastatin '80mg'$  daily as well. Pending lab results, can change ezetimibe to Nexlizet if further LLT is needed.  F/u with PharmD as needed.  Jeanell Mangan E. Joby Hershkowitz, PharmD, BCACP, [id number]. [address] [place] [address]Phone: [phone] Fax: [phone]/18/2022 10:36 AM

## 2021-04-13 NOTE — Patient Instructions (Signed)
Your blood pressure goal is < 130/50mHg  Continue taking your current medications  We'll give you a call tomorrow with your lab results

## 2021-04-13 NOTE — Telephone Encounter (Signed)
Called pt and left message to discuss lab results.  BMET stable, K improved to 5 but still unable to start spironolactone for CHF.   Lipids improved after adding ezetimibe '10mg'$  daily to atorvastatin '80mg'$  daily. Would consider changing ezetimibe to Nexlizet to target LDL < 55 or addition of Vascepa to lower TG and CV risk if he's willing. Has both Medicare and Medicaid so branded meds have affordable copays.

## 2021-04-14 NOTE — Telephone Encounter (Signed)
Pt pt is returning a call in regards to results

## 2021-04-14 NOTE — Telephone Encounter (Signed)
Reviewed results with patient and potential medication options. Patient would like to get through his vascular procedure and then when he follows up with Dr. In Oct consider adding one of those medications.

## 2021-04-14 NOTE — Telephone Encounter (Signed)
Patient called back and LVM. Called pt back. No answer. LVM

## 2021-04-24 ENCOUNTER — Ambulatory Visit (HOSPITAL_COMMUNITY)
Admission: RE | Admit: 2021-04-24 | Discharge: 2021-04-24 | Disposition: A | Discharge status: Home / Self Care | Payer: Medicare Other | Source: Ambulatory Visit | Attending: Cardiology | Admitting: Cardiology

## 2021-04-24 ENCOUNTER — Other Ambulatory Visit: Payer: Self-pay

## 2021-04-24 ENCOUNTER — Ambulatory Visit (HOSPITAL_BASED_OUTPATIENT_CLINIC_OR_DEPARTMENT_OTHER)
Admission: RE | Admit: 2021-04-24 | Discharge: 2021-04-24 | Disposition: A | Discharge status: Home / Self Care | Payer: Medicare Other | Source: Ambulatory Visit | Attending: Cardiovascular Disease | Admitting: Cardiovascular Disease

## 2021-04-24 DIAGNOSIS — I739 Peripheral vascular disease, unspecified: Secondary | ICD-10-CM | POA: Diagnosis present

## 2021-04-24 DIAGNOSIS — I6523 Occlusion and stenosis of bilateral carotid arteries: Secondary | ICD-10-CM | POA: Insufficient documentation

## 2021-04-24 DIAGNOSIS — E785 Hyperlipidemia, unspecified: Secondary | ICD-10-CM | POA: Diagnosis present

## 2021-04-24 DIAGNOSIS — I700 Atherosclerosis of aorta: Secondary | ICD-10-CM | POA: Insufficient documentation

## 2021-04-25 ENCOUNTER — Telehealth: Payer: Self-pay | Admitting: *Deleted

## 2021-04-25 NOTE — Telephone Encounter (Addendum)
Reviewed procedure/mask/visitor instructions with patient. 

## 2021-04-25 NOTE — Telephone Encounter (Signed)
Patient is returning call to discuss procedure instructions.

## 2021-04-25 NOTE — Telephone Encounter (Signed)
Aortic arch angiography scheduled at [facility] for: Wednesday April 26, 2021 10:30 AM [facility] Main Entrance A [facility]) at: 5:30 AM-pre-procedure hydration   No solid food after midnight prior to cath, clear liquids until 5 AM day of procedure.  Medication instructions: Hold: -Jardiance-AM of procedure -Entresto-day before and day of procedure-GFR 46  Except hold medications morning medications can be taken pre-cath with sips of water including aspirin 81 mg.    Confirmed patient has responsible adult to drive home post procedure and be with patient first 24 hours after arriving home.  Patients are allowed one visitor in the waiting room during the time they are at the hospital for their procedure. Both patient and visitor must wear a mask once they enter the hospital.   Patient reports does not currently have any symptoms concerning for COVID-19 and no household members with COVID-19 like illness.       LMTCB to review procedure instructions with patient.

## 2021-04-26 ENCOUNTER — Ambulatory Visit (HOSPITAL_COMMUNITY)
Admission: RE | Admit: 2021-04-26 | Discharge: 2021-04-27 | Disposition: A | Discharge status: Home / Self Care | Payer: Medicare Other | Attending: Cardiovascular Disease | Admitting: Cardiovascular Disease

## 2021-04-26 ENCOUNTER — Other Ambulatory Visit: Payer: Self-pay

## 2021-04-26 ENCOUNTER — Encounter (HOSPITAL_COMMUNITY): Payer: Self-pay | Admitting: Cardiovascular Disease

## 2021-04-26 ENCOUNTER — Ambulatory Visit (HOSPITAL_COMMUNITY)
Admission: RE | Disposition: A | Discharge status: Home / Self Care | Payer: Self-pay | Source: Home / Self Care | Attending: Cardiovascular Disease

## 2021-04-26 DIAGNOSIS — I771 Stricture of artery: Secondary | ICD-10-CM | POA: Diagnosis present

## 2021-04-26 DIAGNOSIS — Z7984 Long term (current) use of oral hypoglycemic drugs: Secondary | ICD-10-CM | POA: Insufficient documentation

## 2021-04-26 DIAGNOSIS — Z886 Allergy status to analgesic agent status: Secondary | ICD-10-CM | POA: Diagnosis not present

## 2021-04-26 DIAGNOSIS — Z7982 Long term (current) use of aspirin: Secondary | ICD-10-CM | POA: Insufficient documentation

## 2021-04-26 DIAGNOSIS — Z87891 Personal history of nicotine dependence: Secondary | ICD-10-CM | POA: Diagnosis not present

## 2021-04-26 DIAGNOSIS — I70213 Atherosclerosis of native arteries of extremities with intermittent claudication, bilateral legs: Secondary | ICD-10-CM | POA: Insufficient documentation

## 2021-04-26 DIAGNOSIS — I5022 Chronic systolic (congestive) heart failure: Secondary | ICD-10-CM | POA: Insufficient documentation

## 2021-04-26 DIAGNOSIS — I6523 Occlusion and stenosis of bilateral carotid arteries: Secondary | ICD-10-CM | POA: Insufficient documentation

## 2021-04-26 DIAGNOSIS — N189 Chronic kidney disease, unspecified: Secondary | ICD-10-CM | POA: Insufficient documentation

## 2021-04-26 DIAGNOSIS — I2510 Atherosclerotic heart disease of native coronary artery without angina pectoris: Secondary | ICD-10-CM | POA: Insufficient documentation

## 2021-04-26 DIAGNOSIS — I130 Hypertensive heart and chronic kidney disease with heart failure and stage 1 through stage 4 chronic kidney disease, or unspecified chronic kidney disease: Secondary | ICD-10-CM | POA: Insufficient documentation

## 2021-04-26 DIAGNOSIS — Z79899 Other long term (current) drug therapy: Secondary | ICD-10-CM | POA: Insufficient documentation

## 2021-04-26 DIAGNOSIS — Z8249 Family history of ischemic heart disease and other diseases of the circulatory system: Secondary | ICD-10-CM | POA: Diagnosis not present

## 2021-04-26 DIAGNOSIS — Z888 Allergy status to other drugs, medicaments and biological substances status: Secondary | ICD-10-CM | POA: Diagnosis not present

## 2021-04-26 HISTORY — PX: AORTIC ARCH ANGIOGRAPHY: CATH118224

## 2021-04-26 HISTORY — PX: PERIPHERAL VASCULAR INTERVENTION: CATH118257

## 2021-04-26 LAB — POCT ACTIVATED CLOTTING TIME
Activated Clotting Time: 289 seconds
Activated Clotting Time: 231 seconds
Activated Clotting Time: 173 seconds

## 2021-04-26 LAB — GLUCOSE, CAPILLARY: Glucose-Capillary: 92 mg/dL (ref 70–99)

## 2021-04-26 SURGERY — AORTIC ARCH ANGIOGRAPHY
Anesthesia: LOCAL

## 2021-04-26 MED ORDER — EZETIMIBE 10 MG PO TABS
10.0000 mg | ORAL_TABLET | Freq: Every day | ORAL | Status: DC
  Administered 2021-04-27: 10 mg via ORAL
  Filled 2021-04-26 (×2): qty 1

## 2021-04-26 MED ORDER — SODIUM CHLORIDE 0.9 % IV SOLN: 250.0000 mL | INTRAVENOUS | Status: DC | PRN

## 2021-04-26 MED ORDER — LABETALOL HCL 5 MG/ML IV SOLN
INTRAVENOUS | Status: AC
  Filled 2021-04-26: qty 4

## 2021-04-26 MED ORDER — SODIUM CHLORIDE 0.9% FLUSH: 3.0000 mL | INTRAVENOUS | Status: DC | PRN

## 2021-04-26 MED ORDER — CARVEDILOL 12.5 MG PO TABS: 25.0000 mg | ORAL_TABLET | Freq: Two times a day (BID) | ORAL | Status: DC

## 2021-04-26 MED ORDER — FENTANYL CITRATE (PF) 100 MCG/2ML IJ SOLN
INTRAMUSCULAR | Status: DC | PRN
  Administered 2021-04-26: 50 ug via INTRAVENOUS

## 2021-04-26 MED ORDER — ATORVASTATIN CALCIUM 40 MG PO TABS: 40.0000 mg | ORAL_TABLET | Freq: Every day | ORAL | Status: DC

## 2021-04-26 MED ORDER — ONDANSETRON HCL 4 MG/2ML IJ SOLN: 4.0000 mg | Freq: Four times a day (QID) | INTRAMUSCULAR | Status: DC | PRN

## 2021-04-26 MED ORDER — LABETALOL HCL 5 MG/ML IV SOLN
INTRAVENOUS | Status: DC | PRN
  Administered 2021-04-26: 20 mg via INTRAVENOUS

## 2021-04-26 MED ORDER — HEPARIN SODIUM (PORCINE) 1000 UNIT/ML IJ SOLN
INTRAMUSCULAR | Status: AC
  Filled 2021-04-26: qty 1

## 2021-04-26 MED ORDER — HEPARIN (PORCINE) IN NACL 1000-0.9 UT/500ML-% IV SOLN
INTRAVENOUS | Status: AC
  Filled 2021-04-26: qty 500

## 2021-04-26 MED ORDER — EZETIMIBE 10 MG PO TABS: 10.0000 mg | ORAL_TABLET | Freq: Every day | ORAL | Status: DC

## 2021-04-26 MED ORDER — SACUBITRIL-VALSARTAN 97-103 MG PO TABS
1.0000 | ORAL_TABLET | Freq: Two times a day (BID) | ORAL | Status: DC
  Administered 2021-04-26 – 2021-04-27 (×2): 1 via ORAL
  Filled 2021-04-26 (×3): qty 1

## 2021-04-26 MED ORDER — CARVEDILOL 25 MG PO TABS
25.0000 mg | ORAL_TABLET | Freq: Two times a day (BID) | ORAL | Status: DC
  Administered 2021-04-26 – 2021-04-27 (×2): 25 mg via ORAL
  Filled 2021-04-26 (×2): qty 1

## 2021-04-26 MED ORDER — ADULT MULTIVITAMIN W/MINERALS CH: 1.0000 | ORAL_TABLET | Freq: Every day | ORAL | Status: DC

## 2021-04-26 MED ORDER — HYDRALAZINE HCL 20 MG/ML IJ SOLN
10.0000 mg | INTRAMUSCULAR | Status: AC | PRN
Start: 2021-04-26 — End: 2021-04-27
  Administered 2021-04-26 – 2021-04-27 (×2): 10 mg via INTRAVENOUS
  Filled 2021-04-26: qty 1

## 2021-04-26 MED ORDER — FENTANYL CITRATE (PF) 100 MCG/2ML IJ SOLN
INTRAMUSCULAR | Status: AC
  Filled 2021-04-26: qty 2

## 2021-04-26 MED ORDER — IODIXANOL 320 MG/ML IV SOLN
INTRAVENOUS | Status: DC | PRN
  Administered 2021-04-26: 75 mL via INTRA_ARTERIAL

## 2021-04-26 MED ORDER — MIDAZOLAM HCL 2 MG/2ML IJ SOLN
INTRAMUSCULAR | Status: DC | PRN
  Administered 2021-04-26: 1 mg via INTRAVENOUS

## 2021-04-26 MED ORDER — ADULT MULTIVITAMIN W/MINERALS CH
1.0000 | ORAL_TABLET | Freq: Every day | ORAL | Status: DC
  Administered 2021-04-26 – 2021-04-27 (×2): 1 via ORAL
  Filled 2021-04-26 (×2): qty 1

## 2021-04-26 MED ORDER — ASPIRIN 81 MG PO CHEW: 81.0000 mg | CHEWABLE_TABLET | ORAL | Status: DC

## 2021-04-26 MED ORDER — SODIUM CHLORIDE 0.9% FLUSH: 3.0000 mL | Freq: Two times a day (BID) | INTRAVENOUS | Status: DC

## 2021-04-26 MED ORDER — ASPIRIN EC 81 MG PO TBEC
81.0000 mg | DELAYED_RELEASE_TABLET | Freq: Every day | ORAL | Status: DC
  Administered 2021-04-27: 81 mg via ORAL
  Filled 2021-04-26: qty 1

## 2021-04-26 MED ORDER — CLOPIDOGREL BISULFATE 75 MG PO TABS
75.0000 mg | ORAL_TABLET | Freq: Every day | ORAL | Status: DC
  Administered 2021-04-27: 75 mg via ORAL
  Filled 2021-04-26 (×2): qty 1

## 2021-04-26 MED ORDER — FENTANYL CITRATE (PF) 100 MCG/2ML IJ SOLN
50.0000 ug | Freq: Once | INTRAMUSCULAR | Status: AC
  Administered 2021-04-26: 50 ug via INTRAVENOUS

## 2021-04-26 MED ORDER — MIDAZOLAM HCL 2 MG/2ML IJ SOLN
INTRAMUSCULAR | Status: AC
  Filled 2021-04-26: qty 2

## 2021-04-26 MED ORDER — HEPARIN (PORCINE) IN NACL 1000-0.9 UT/500ML-% IV SOLN
INTRAVENOUS | Status: DC | PRN
  Administered 2021-04-26 (×2): 500 mL

## 2021-04-26 MED ORDER — SODIUM CHLORIDE 0.9% FLUSH
3.0000 mL | Freq: Two times a day (BID) | INTRAVENOUS | Status: DC
  Administered 2021-04-27: 3 mL via INTRAVENOUS

## 2021-04-26 MED ORDER — CLOPIDOGREL BISULFATE 300 MG PO TABS
ORAL_TABLET | ORAL | Status: AC
  Filled 2021-04-26: qty 1

## 2021-04-26 MED ORDER — ASPIRIN EC 81 MG PO TBEC: 81.0000 mg | DELAYED_RELEASE_TABLET | Freq: Every day | ORAL | Status: DC

## 2021-04-26 MED ORDER — ACETAMINOPHEN 500 MG PO TABS: 500.0000 mg | ORAL_TABLET | Freq: Four times a day (QID) | ORAL | Status: DC | PRN

## 2021-04-26 MED ORDER — LABETALOL HCL 5 MG/ML IV SOLN
10.0000 mg | INTRAVENOUS | Status: AC | PRN
Start: 2021-04-26 — End: 2021-04-26
  Administered 2021-04-26 (×4): 10 mg via INTRAVENOUS
  Filled 2021-04-26: qty 4

## 2021-04-26 MED ORDER — EMPAGLIFLOZIN 10 MG PO TABS
10.0000 mg | ORAL_TABLET | Freq: Every day | ORAL | Status: DC
  Administered 2021-04-27: 10 mg via ORAL
  Filled 2021-04-26: qty 1

## 2021-04-26 MED ORDER — SODIUM CHLORIDE 0.9 % IV SOLN: INTRAVENOUS | Status: DC

## 2021-04-26 MED ORDER — HYDRALAZINE HCL 20 MG/ML IJ SOLN
INTRAMUSCULAR | Status: AC
  Filled 2021-04-26: qty 1

## 2021-04-26 MED ORDER — ATORVASTATIN CALCIUM 40 MG PO TABS
40.0000 mg | ORAL_TABLET | Freq: Every day | ORAL | Status: DC
  Administered 2021-04-27: 40 mg via ORAL
  Filled 2021-04-26: qty 1

## 2021-04-26 MED ORDER — DOXYLAMINE SUCCINATE (SLEEP) 25 MG PO TABS
50.0000 mg | ORAL_TABLET | Freq: Every evening | ORAL | Status: DC | PRN
  Filled 2021-04-26: qty 2

## 2021-04-26 MED ORDER — SODIUM CHLORIDE 0.9 % IV SOLN
INTRAVENOUS | Status: AC
  Administered 2021-04-26: 75 mL/h via INTRAVENOUS

## 2021-04-26 MED ORDER — LIDOCAINE HCL (PF) 1 % IJ SOLN
INTRAMUSCULAR | Status: DC | PRN
  Administered 2021-04-26: 15 mL via INTRADERMAL

## 2021-04-26 MED ORDER — CLOPIDOGREL BISULFATE 300 MG PO TABS
ORAL_TABLET | ORAL | Status: DC | PRN
  Administered 2021-04-26: 300 mg via ORAL

## 2021-04-26 SURGICAL SUPPLY — 14 items
CATH ANGIO 5F PIGTAIL 100CM (CATHETERS) ×1 IMPLANT
CATH INFINITI JR4 5F (CATHETERS) ×1 IMPLANT
KIT MICROPUNCTURE NIT STIFF (SHEATH) ×1 IMPLANT
KIT PV (KITS) ×3 IMPLANT
SHEATH DESTINATION 7FR 90 (SHEATH) ×1 IMPLANT
SHEATH PINNACLE 5F 10CM (SHEATH) ×1 IMPLANT
SHEATH PINNACLE 7F 10CM (SHEATH) ×1 IMPLANT
STENT VIABAHNBX 8X29X135 (Permanent Stent) ×1 IMPLANT
SYR MEDRAD MARK 7 150ML (SYRINGE) ×3 IMPLANT
TRANSDUCER W/STOPCOCK (MISCELLANEOUS) ×3 IMPLANT
TRAY PV CATH (CUSTOM PROCEDURE TRAY) ×3 IMPLANT
WIRE HITORQ VERSACORE ST 145CM (WIRE) ×1 IMPLANT
WIRE ROSEN-J .035X260CM (WIRE) ×1 IMPLANT
WIRE VERSACORE LOC 115CM (WIRE) ×1 IMPLANT

## 2021-04-26 NOTE — Progress Notes (Signed)
Site area: right groin 7 Fr arterial sheath was removed   Site Prior to Removal:  Level 0  Pressure Applied For 20 MINUTES    Bed rest Beginning at 1500 X 6 hours  Manual:   Yes.    Patient Status During Pull:  Stable  Post Pull Groin Site:  Level 0  Post Pull Instructions Given:  Yes.    Post Pull Pulses Present:  Yes.    Dressing Applied:  Yes.    Comments:

## 2021-04-26 NOTE — Interval H&P Note (Signed)
History and Physical Interval Note:  04/26/2021 11:10 AM  Martin Archer  has presented today for surgery, with the diagnosis of carotid stenosis.  The various methods of treatment have been discussed with the patient and family. After consideration of risks, benefits and other options for treatment, the patient has consented to  Procedure(s): AORTIC ARCH ANGIOGRAPHY (N/A) as a surgical intervention.  The patient's history has been reviewed, patient examined, no change in status, stable for surgery.  I have reviewed the patient's chart and labs.  Questions were answered to the patient's satisfaction.     Kathlyn Sacramento

## 2021-04-26 NOTE — Progress Notes (Signed)
Patient transferred from cath lab at 1526hrs.  Oriented to unit and reviewed post procedure instructions. Patient verbalized uderstanding.   Right femoral site level zero.

## 2021-04-27 ENCOUNTER — Other Ambulatory Visit (HOSPITAL_COMMUNITY): Payer: Self-pay

## 2021-04-27 ENCOUNTER — Encounter (HOSPITAL_COMMUNITY): Payer: Self-pay | Admitting: Cardiovascular Disease

## 2021-04-27 ENCOUNTER — Other Ambulatory Visit: Payer: Self-pay | Admitting: *Deleted

## 2021-04-27 DIAGNOSIS — I10 Essential (primary) hypertension: Secondary | ICD-10-CM

## 2021-04-27 DIAGNOSIS — I771 Stricture of artery: Secondary | ICD-10-CM | POA: Diagnosis not present

## 2021-04-27 DIAGNOSIS — I700 Atherosclerosis of aorta: Secondary | ICD-10-CM

## 2021-04-27 MED ORDER — CLOPIDOGREL BISULFATE 75 MG PO TABS
75.0000 mg | ORAL_TABLET | Freq: Every day | ORAL | 2 refills | Status: DC
Start: 2021-04-27 — End: 2021-05-22
  Filled 2021-04-27: qty 30, 30d supply, fill #0

## 2021-04-27 MED FILL — Heparin Sodium (Porcine) Inj 1000 Unit/ML: INTRAMUSCULAR | Qty: 10 | Status: AC

## 2021-04-27 NOTE — Progress Notes (Signed)
Discharge instructions reviewed with patient with patient's daughter at bedside.  Patient voiced understanding in his own words.  PIV removed.

## 2021-04-27 NOTE — Discharge Summary (Signed)
Discharge Summary    Patient ID: Martin Archer MRN: [id number]; DOB: 14-Sep-1960  Admit date: 04/26/2021 Discharge date: 04/27/2021  PCP:  Biagio Borg, MD   [facility] Providers Cardiologist:  Larae Grooms, MD   {  Discharge Diagnoses    Active Problems:   Stenosis of left subclavian artery [facility])    Diagnostic Studies/Procedures    PV angiogram: 04/26/21  1.  Severe left subclavian artery stenosis. 2.  Successful balloon expandable covered stent stent placement to the left subclavian artery. 3.  Right common femoral artery angiography at the end of the case showed flush occlusion of the SFA with collaterals from the profunda   Recommendations: Dual antiplatelet therapy for at least 1 month. Aggressive treatment of risk factors. Given chronic kidney disease and uncontrolled hypertension at the time of the procedure, we will keep the patient overnight for gentle hydration and blood pressure control. The patient does have underlying peripheral arterial disease of the lower extremities that will have to be addressed at a later time. _____________   History of Present Illness     Martin Archer is a 60 y.o. male with PMH of HTN, tobacco use, carotid artery disease s/p bilateral endarterectomy, chronic systolic HF, anemia, CKD who was referred to Dr. Fletcher Anon from Dr. Irish Lack for left subclavian artery disease. He had an echocardiogram done in April which showed an ejection fraction of 40 to 45% with global hypokinesis, severely dilated atria, mild mitral regurgitation mildly calcified aortic valve.   He underwent a right and left cardiac catheterization in June of this year which showed chronically occluded proximal right coronary artery with left-to-right collaterals and mild nonobstructive disease on the left side.  Medical therapy was recommended.   Carotid Doppler done in May showed an occluded left carotid artery with moderate disease on the right side.   Significant stenosis noted in the left subclavian artery with 37 mm of pressure difference between the arms and retrograde flow in the left vertebral artery.   He uses both arms equally and reported significant discomfort in the left arm with exertion.  He gets numbness and weakness with inability to lift his left arm.  This was usually associated with dizziness. In addition, he complained of numbness in both feet and leg aching on both sides with exertion. He was set up for outpatient PV angiogram.   Hospital Course     Underwent successful stenting to the left subclavian artery with recommendations for DAPT with ASA/plavix for one month. He was kept overnight. No complications noted post procedure. Able to ambulate without issues. Home medications were continued the same. Patient does have underlying PAD in lower extremities to be addressed at later time.   General: Well developed, well nourished, male appearing in no acute distress. Head: Normocephalic, atraumatic.  Neck: Supple without bruits, JVD. Lungs:  Resp regular and unlabored, CTA. Heart: RRR, S1, S2, no S3, S4, or murmur; no rub. Abdomen: Soft, non-tender, non-distended with normoactive bowel sounds. No hepatomegaly. No rebound/guarding. No obvious abdominal masses. Extremities: No clubbing, cyanosis, edema. Distal pedal pulses are 2+ bilaterally. Right femoral cath site stable without bruising or hematoma Neuro: Alert and oriented X 3. Moves all extremities spontaneously. Psych: Normal affect.  Patient was seen by Dr. Ellyn Hack and deemed stable for discharge home. Follow up in the office has been arranged. Medications sent to [place]. Educated by PharmD prior to discharge.   Did the patient have an acute coronary syndrome (MI, NSTEMI, STEMI, etc) this  admission?:  No                               _____________  Discharge Vitals Blood pressure (!) 161/85, pulse 70, temperature 98.2 F (36.8 C), temperature source Oral, resp.  rate 18, height '5\' 8"'$  (1.727 m), weight 90.7 kg, SpO2 95 %.  Filed Weights   04/26/21 0612  Weight: 90.7 kg    Labs & Radiologic Studies    CBC No results for input(s): WBC, NEUTROABS, HGB, HCT, MCV, PLT in the last 72 hours. Basic Metabolic Panel No results for input(s): NA, K, CL, CO2, GLUCOSE, BUN, CREATININE, CALCIUM, MG, PHOS in the last 72 hours. Liver Function Tests No results for input(s): AST, ALT, ALKPHOS, BILITOT, PROT, ALBUMIN in the last 72 hours. No results for input(s): LIPASE, AMYLASE in the last 72 hours. High Sensitivity Troponin:   No results for input(s): TROPONINIHS in the last 720 hours.  BNP Invalid input(s): POCBNP D-Dimer No results for input(s): DDIMER in the last 72 hours. Hemoglobin A1C No results for input(s): HGBA1C in the last 72 hours. Fasting Lipid Panel No results for input(s): CHOL, HDL, LDLCALC, TRIG, CHOLHDL, LDLDIRECT in the last 72 hours. Thyroid Function Tests No results for input(s): TSH, T4TOTAL, T3FREE, THYROIDAB in the last 72 hours.  Invalid input(s): FREET3 _____________  PERIPHERAL VASCULAR CATHETERIZATION  Result Date: 04/26/2021 1.  Severe left subclavian artery stenosis. 2.  Successful balloon expandable covered stent stent placement to the left subclavian artery. 3.  Right common femoral artery angiography at the end of the case showed flush occlusion of the SFA with collaterals from the profunda Recommendations: Dual antiplatelet therapy for at least 1 month. Aggressive treatment of risk factors. Given chronic kidney disease and uncontrolled hypertension at the time of the procedure, we will keep the patient overnight for gentle hydration and blood pressure control. The patient does have underlying peripheral arterial disease of the lower extremities that will have to be addressed at a later time.   VAS [place] LOWER EXT ART SEG MULTI (SEGMENTALS & LE RAYNAUDS)  Result Date: 04/25/2021  LOWER EXTREMITY DOPPLER STUDY Patient Name:   Martin Archer  Date of Exam:   04/24/2021 Medical Rec #: [id number]             Accession #:    [id number] Date of Birth: [birth year]             Patient Gender: M Patient Age:   20 years Exam Location:  [facility] Procedure:      VAS [place] LOWER EXT ART SEG MULTI (SEGMENTALS & LE RAYNAUDS) Referring Phys: [id number] ARIDA --------------------------------------------------------------------------------  Indications: Claudication, and Doctor unable to feel bilateral femoral pulses.              Patient states he does get bilateral calf cramping after walking              15-20 minutes. He denies rest pain. High Risk Factors: Hypertension, past history of smoking. Other Factors: SEE AORTOILIAC AND BILATERAL LEG ARTERIAL DUPLEX REPORTS.                 CKD                 Known left subclavian arterial stenosis.                 History of bilateral carotid endarterectomies.  Comparison Study: None Performing Technologist: Salvadore Dom RVT  Examination Guidelines: A complete evaluation includes at minimum, Doppler waveform signals and systolic blood pressure reading at the level of bilateral brachial, anterior tibial, and posterior tibial arteries, when vessel segments are accessible. Bilateral testing is considered an integral part of a complete examination. Photoelectric Plethysmograph (PPG) waveforms and toe systolic pressure readings are included as required and additional duplex testing as needed. Limited examinations for reoccurring indications may be performed as noted.  ABI Findings: +---------+------------------+-----+----------+--------+ Right    Rt Pressure (mmHg)IndexWaveform  Comment  +---------+------------------+-----+----------+--------+ Brachial 184                                       +---------+------------------+-----+----------+--------+ CFA                             biphasic           +---------+------------------+-----+----------+--------+ Popliteal                        monophasic         +---------+------------------+-----+----------+--------+ PTA      114               0.62 monophasic         +---------+------------------+-----+----------+--------+ PERO     87                     monophasic         +---------+------------------+-----+----------+--------+ DP       100               0.54 monophasic         +---------+------------------+-----+----------+--------+ Great Toe26                0.14 Abnormal           +---------+------------------+-----+----------+--------+ +---------+------------------+-----+----------------+--------------------------+ Left     Lt Pressure (mmHg)IndexWaveform        Comment                    +---------+------------------+-----+----------------+--------------------------+ Brachial 131                    monophasic      known left subclavian                                                      arterial stenosis.         +---------+------------------+-----+----------------+--------------------------+ CFA                             barely triphasic                           +---------+------------------+-----+----------------+--------------------------+ Popliteal                       multiphasic                                +---------+------------------+-----+----------------+--------------------------+ PTA      149  0.81 monophasic                                 +---------+------------------+-----+----------------+--------------------------+ PERO     155                    monophasic                                 +---------+------------------+-----+----------------+--------------------------+ DP       154               0.84 monophasic                                 +---------+------------------+-----+----------------+--------------------------+ Great Toe97                0.53 Abnormal                                    +---------+------------------+-----+----------------+--------------------------+ +-------+-----------+-----------+------------+------------+ ABI/TBIToday's ABIToday's TBIPrevious ABIPrevious TBI +-------+-----------+-----------+------------+------------+ Right  .62        .14                                 +-------+-----------+-----------+------------+------------+ Left   .84        .53                                 +-------+-----------+-----------+------------+------------+   Summary: Right: Resting right ankle-brachial index indicates moderate right lower extremity arterial disease. The right toe-brachial index is abnormal. Left: Resting left ankle-brachial index indicates mild left lower extremity arterial disease. The left toe-brachial index is abnormal. There is a 53 mmHg difference in left to right brachial arm pressure with a known left subclavian arterial stenosis.  *See table(s) above for measurements and observations.  Electronically signed by Larae Grooms MD on 04/25/2021 at 2:50:27 PM.    Final    VAS [place] LOWER EXTREMITY ARTERIAL DUPLEX  Result Date: 04/25/2021 LOWER EXTREMITY ARTERIAL DUPLEX STUDY Patient Name:  DEANTHONY DIZE  Date of Exam:   04/24/2021 Medical Rec #: [id number]             Accession #:    [id number] Date of Birth: [birth year]             Patient Gender: M Patient Age:   12 years Exam Location:  [facility] Procedure:      VAS [place] LOWER EXTREMITY ARTERIAL DUPLEX Referring Phys: [facility] ARIDA --------------------------------------------------------------------------------  Indications: Claudication, and Doctor unable to feel bilateral femoral pulses.              Patient states he does get bilateral calf cramping after walking              15-20 minutes. He denies rest pain. High Risk Factors: Hypertension, past history of smoking. Other Factors: SEE ABI AND AORTOILIAC DUPLEX REPORTS                 CKD                 Known left subclavian arterial stenosis.   Vascular Interventions: Bilateral carotid endarterectomies. Current ABI:  Right .62 Left .45 Comparison Study: None Performing Technologist: Alecia Mackin RVT, RDCS (AE), RDMS  Examination Guidelines: A complete evaluation includes B-mode imaging, spectral Doppler, color Doppler, and power Doppler as needed of all accessible portions of each vessel. Bilateral testing is considered an integral part of a complete examination. Limited examinations for reoccurring indications may be performed as noted.  +-----------+--------+-----+--------+----------+----------------+ RIGHT      PSV cm/sRatioStenosisWaveform  Comments         +-----------+--------+-----+--------+----------+----------------+ CFA Prox   125                  triphasic                  +-----------+--------+-----+--------+----------+----------------+ CFA Distal 120                  triphasic                  +-----------+--------+-----+--------+----------+----------------+ DFA        220                  triphasic                  +-----------+--------+-----+--------+----------+----------------+ SFA Prox   59           occluded          just past origin +-----------+--------+-----+--------+----------+----------------+ SFA Mid                 occluded                           +-----------+--------+-----+--------+----------+----------------+ SFA Distal 15                   monophasicrecannulated     +-----------+--------+-----+--------+----------+----------------+ POP Prox   10                   monophasic                 +-----------+--------+-----+--------+----------+----------------+ POP Distal 13                   monophasic                 +-----------+--------+-----+--------+----------+----------------+ TP Trunk   15                   monophasic                 +-----------+--------+-----+--------+----------+----------------+ ATA Prox   14                    monophasic                 +-----------+--------+-----+--------+----------+----------------+ ATA Distal 15                   monophasic                 +-----------+--------+-----+--------+----------+----------------+ PTA Prox   17                   monophasic                 +-----------+--------+-----+--------+----------+----------------+ PTA Distal 25                   monophasic                 +-----------+--------+-----+--------+----------+----------------+ PERO Prox  18  monophasic                 +-----------+--------+-----+--------+----------+----------------+ PERO Distal14                   monophasic                 +-----------+--------+-----+--------+----------+----------------+  +-----------+--------+-----+---------------+----------+--------+ LEFT       PSV cm/sRatioStenosis       Waveform  Comments +-----------+--------+-----+---------------+----------+--------+ CFA Prox   195          30-49% stenosistriphasic          +-----------+--------+-----+---------------+----------+--------+ CFA Distal 92                          triphasic          +-----------+--------+-----+---------------+----------+--------+ DFA        303                         triphasic          +-----------+--------+-----+---------------+----------+--------+ SFA Prox   243          50-74% stenosisbiphasic           +-----------+--------+-----+---------------+----------+--------+ SFA Mid    255          50-74% stenosisbiphasic           +-----------+--------+-----+---------------+----------+--------+ SFA Distal 77                          biphasic           +-----------+--------+-----+---------------+----------+--------+ POP Prox   41                          biphasic           +-----------+--------+-----+---------------+----------+--------+ POP Distal 39                          biphasic            +-----------+--------+-----+---------------+----------+--------+ TP Trunk   62                          biphasic           +-----------+--------+-----+---------------+----------+--------+ ATA Prox   32                          biphasic           +-----------+--------+-----+---------------+----------+--------+ ATA Distal 20                          biphasic           +-----------+--------+-----+---------------+----------+--------+ PTA Prox   26                          monophasic         +-----------+--------+-----+---------------+----------+--------+ PTA Distal 16                          monophasic         +-----------+--------+-----+---------------+----------+--------+ PERO Distal16                          monophasic         +-----------+--------+-----+---------------+----------+--------+  A focal velocity elevation of 243 cm/s was obtained at SFA PRX with a VR of 2.4. Findings are characteristic of 50-74% stenosis. A 2nd focal velocity elevation was visualized, measuring 255 cm/s at MID SFA with a VR of 2.77. Findings are characteristic of 50-74% stenosis.  Findings reported to Dr. Tyrell Antonio email through [facility] at 11:25 am .  Summary: Right: Total occlusion noted in the proximal to mid superficial femoral artery. Calcified heterogenous plaque noted throughout extremity. Left: 50-74% stenosis noted in the superficial femoral artery. Calcified heterogenous plaque noted throughout extremity.  See table(s) above for measurements and observations. Suggest follow up study in Patient scheduled for aortic arch angiography on 04/26/2021 and office visit with Dr. Fletcher Anon 05/09/2021 at 9:40 am. Vascular consult recommended. Electronically signed by Larae Grooms MD on 04/25/2021 at 5:47:04 PM.    Final    VAS US AORTA/IVC/ILIACS  Result Date: 04/25/2021 ABDOMINAL AORTA STUDY Patient Name:  MAKELL KEIZER  Date of Exam:   04/24/2021 Medical Rec #: [id number]             Accession  #:    [id number] Date of Birth: Dec 30, 1960             Patient Gender: M Patient Age:   12 years Exam Location:  [facility] Procedure:      VAS US AORTA/IVC/ILIACS Referring Phys: Rogue Jury ARIDA --------------------------------------------------------------------------------  Indications: Doctor unable to feel bilateral femoral pulses. Patient states he              does get bilateral calf cramping after walking 15-20 minutes. He              denies rest pain Risk Factors: Hypertension, past history of smoking. Other Factors: SEE ABI AND BILATERAL LEG ARTERIAL DUPLEX REPORTS                 Today's ABI                Right .62 Left .84                 CKD                 Patient has known left subclavian arterial stenosis. Vascular Interventions: Bilateral carotid endarterectomies. Limitations: Air/bowel gas and obesity.  Comparison Study: None Performing Technologist: Alecia Mackin RVT, RDCS (AE), RDMS  Examination Guidelines: A complete evaluation includes B-mode imaging, spectral Doppler, color Doppler, and power Doppler as needed of all accessible portions of each vessel. Bilateral testing is considered an integral part of a complete examination. Limited examinations for reoccurring indications may be performed as noted.  Abdominal Aorta Findings: +-------------+------+----------+---------+---------+--------+-----------------+ Location     AP    Trans (cm)PSV      Waveform ThrombusComments                       (cm)            (cm/s)                                      +-------------+------+----------+---------+---------+--------+-----------------+ Proximal     2.50  2.50      64       biphasic                           +-------------+------+----------+---------+---------+--------+-----------------+ Mid  2.80  2.90                                                  +-------------+------+----------+---------+---------+--------+-----------------+ Distal       2.90  2.90       59       biphasic                           +-------------+------+----------+---------+---------+--------+-----------------+ RT CIA Prox  0.9   1.0       86       triphasic                          +-------------+------+----------+---------+---------+--------+-----------------+ RT CIA Mid                   99       triphasic                          +-------------+------+----------+---------+---------+--------+-----------------+ RT CIA Distal                127      triphasic                          +-------------+------+----------+---------+---------+--------+-----------------+ RT EIA Prox                                            overlying bowel                                                          gas               +-------------+------+----------+---------+---------+--------+-----------------+ RT EIA Mid                   132      triphasic                          +-------------+------+----------+---------+---------+--------+-----------------+ RT EIA Distal                229      triphasic        > 50 % stenosis   +-------------+------+----------+---------+---------+--------+-----------------+ LT CIA Prox  1.3   1.1       59       biphasic         ectatic           +-------------+------+----------+---------+---------+--------+-----------------+ LT CIA Mid                   79       biphasic                           +-------------+------+----------+---------+---------+--------+-----------------+ LT CIA Distal                110      biphasic                           +-------------+------+----------+---------+---------+--------+-----------------+  LT EIA Prox                  111      triphasic                          +-------------+------+----------+---------+---------+--------+-----------------+ LT EIA Mid                   168      biphasic                            +-------------+------+----------+---------+---------+--------+-----------------+ LT EIA Distal                149      triphasic                          +-------------+------+----------+---------+---------+--------+-----------------+ Visualization of the Right EIA Proximal artery was limited. 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 The largest aortic measurement is 2.9 cm. Moderate atherosclerosis noted throughout aorta. Stenosis: +--------------------+-------------+ Location            Stenosis      +--------------------+-------------+ Right External Iliac>50% stenosis +--------------------+-------------+ Moderate atherosclerosis noteed thoughout bilateral iliac arteries. IVC/Iliac: There is no evidence of thrombus involving the IVC.  *See table(s) above for measurements and observations.  Electronically signed by Larae Grooms MD on 04/25/2021 at 5:45:50 PM.    Final    Disposition   Pt is being discharged home today in good condition.  Follow-up Plans & Appointments     Follow-up Information     Wellington Hampshire, MD Follow up on 05/09/2021.   Specialty: Cardiology Why: at 9:40am for your follow up appt Contact information: [address] [place] [address] [phone]                Discharge Instructions     Call MD for:  redness, tenderness, or signs of infection (pain, swelling, redness, odor or green/yellow discharge around incision site)   Complete by: As directed    Diet - low sodium heart healthy   Complete by: As directed    Discharge instructions   Complete by: As directed    Groin Site Care Refer to this sheet in the next few weeks. These instructions provide you with information  on caring for yourself after your procedure. Your caregiver may also give you more specific instructions. Your treatment has been planned according to current medical practices, but problems sometimes occur. Call your caregiver if you have any problems or questions after your procedure. HOME CARE INSTRUCTIONS You may shower 24 hours after the procedure. Remove the bandage (dressing) and gently wash the site with plain soap and water. Gently pat the site dry.  Do not apply powder or lotion to the site.  Do not sit in a bathtub, swimming pool, or whirlpool for 5 to 7 days.  No bending, squatting, or lifting anything over 10 pounds (4.5 kg) as directed by your caregiver.  Inspect the site at least twice daily.  Do not drive home if you are discharged the same day of the procedure. Have someone else drive you.  You may drive 24  hours after the procedure unless otherwise instructed by your caregiver.  What to expect: Any bruising will usually fade within 1 to 2 weeks.  Blood that collects in the tissue (hematoma) may be painful to the touch. It should usually decrease in size and tenderness within 1 to 2 weeks.  SEEK IMMEDIATE MEDICAL CARE IF: You have unusual pain at the groin site or down the affected leg.  You have redness, warmth, swelling, or pain at the groin site.  You have drainage (other than a small amount of blood on the dressing).  You have chills.  You have a fever or persistent symptoms for more than 72 hours.  You have a fever and your symptoms suddenly get worse.  Your leg becomes pale, cool, tingly, or numb.  You have heavy bleeding from the site. Hold pressure on the site. Marland Kitchen  PLEASE DO NOT MISS ANY DOSES OF YOUR PLAVIX!!!!! Also keep a log of you blood pressures and bring back to your follow up appt. Please call the office with any questions.   Patients taking blood thinners should generally stay away from medicines like ibuprofen, Advil, Motrin, naproxen, and Aleve due to risk  of stomach bleeding. You may take Tylenol as directed or talk to your primary doctor about alternatives.   PLEASE ENSURE THAT YOU DO NOT RUN OUT OF YOUR PLAVIX. This medication is very important to remain on for at least one month. IF you have issues obtaining this medication due to cost please CALL the office 3-5 business days prior to running out in order to prevent missing doses of this medication.   Increase activity slowly   Complete by: As directed        Discharge Medications   Allergies as of 04/27/2021       Reactions   Adderall [amphetamine-dextroamphet Er] Diarrhea   Prozac [fluoxetine Hcl] Diarrhea   Sertraline Hcl Diarrhea   Advil [ibuprofen] Diarrhea        Medication List     TAKE these medications    acetaminophen 500 MG tablet Commonly known as: TYLENOL Take 500-1,000 mg by mouth every 6 (six) hours as needed for moderate pain or headache.   aspirin EC 81 MG tablet Take 1 tablet (81 mg total) by mouth daily. Swallow whole.   atorvastatin 80 MG tablet Commonly known as: LIPITOR TAKE 1 TABLET BY MOUTH DAILY   carvedilol 25 MG tablet Commonly known as: COREG Take 1 tablet (25 mg total) by mouth 2 (two) times daily.   clopidogrel 75 MG tablet Commonly known as: PLAVIX Take 1 tablet (75 mg total) by mouth daily with breakfast.   doxylamine (Sleep) 25 MG tablet Commonly known as: UNISOM Take 50 mg by mouth at bedtime as needed for sleep.   empagliflozin 10 MG Tabs tablet Commonly known as: Jardiance Take 1 tablet (10 mg total) by mouth daily before breakfast.   Entresto 97-103 MG Generic drug: sacubitril-valsartan Take 1 tablet by mouth 2 (two) times daily.   ezetimibe 10 MG tablet Commonly known as: ZETIA Take 1 tablet (10 mg total) by mouth daily.   LUBRICATING EYE DROPS OP Place 1 drop into both eyes daily as needed (dry eyes).   multivitamin with minerals Tabs tablet Take 1 tablet by mouth daily.           Outstanding  Labs/Studies   N/a   Duration of Discharge Encounter   Greater than 30 minutes including physician time.  Signed, Reino Bellis, NP 04/27/2021, 12:25 PM

## 2021-05-03 ENCOUNTER — Other Ambulatory Visit: Payer: Self-pay | Admitting: Cardiovascular Disease

## 2021-05-03 DIAGNOSIS — I771 Stricture of artery: Secondary | ICD-10-CM

## 2021-05-03 DIAGNOSIS — I700 Atherosclerosis of aorta: Secondary | ICD-10-CM

## 2021-05-03 DIAGNOSIS — I6523 Occlusion and stenosis of bilateral carotid arteries: Secondary | ICD-10-CM

## 2021-05-05 ENCOUNTER — Ambulatory Visit (HOSPITAL_COMMUNITY)
Admission: RE | Admit: 2021-05-05 | Discharge: 2021-05-05 | Disposition: A | Discharge status: Home / Self Care | Payer: Medicare Other | Source: Ambulatory Visit | Attending: Cardiovascular Disease | Admitting: Cardiovascular Disease

## 2021-05-05 ENCOUNTER — Other Ambulatory Visit: Payer: Self-pay

## 2021-05-05 DIAGNOSIS — I700 Atherosclerosis of aorta: Secondary | ICD-10-CM

## 2021-05-05 DIAGNOSIS — I771 Stricture of artery: Secondary | ICD-10-CM

## 2021-05-05 DIAGNOSIS — I6523 Occlusion and stenosis of bilateral carotid arteries: Secondary | ICD-10-CM | POA: Diagnosis present

## 2021-05-09 ENCOUNTER — Encounter: Payer: Self-pay | Admitting: Cardiovascular Disease

## 2021-05-09 ENCOUNTER — Ambulatory Visit (INDEPENDENT_AMBULATORY_CARE_PROVIDER_SITE_OTHER): Payer: Medicare Other | Admitting: Cardiovascular Disease

## 2021-05-09 ENCOUNTER — Other Ambulatory Visit: Payer: Self-pay

## 2021-05-09 VITALS — BP 112/78 | HR 82 | Ht 68.0 in | Wt 206.0 lb

## 2021-05-09 DIAGNOSIS — I739 Peripheral vascular disease, unspecified: Secondary | ICD-10-CM | POA: Diagnosis not present

## 2021-05-09 DIAGNOSIS — I5022 Chronic systolic (congestive) heart failure: Secondary | ICD-10-CM

## 2021-05-09 DIAGNOSIS — I6523 Occlusion and stenosis of bilateral carotid arteries: Secondary | ICD-10-CM

## 2021-05-09 DIAGNOSIS — I771 Stricture of artery: Secondary | ICD-10-CM | POA: Diagnosis not present

## 2021-05-09 DIAGNOSIS — I2510 Atherosclerotic heart disease of native coronary artery without angina pectoris: Secondary | ICD-10-CM | POA: Diagnosis not present

## 2021-05-09 DIAGNOSIS — I6521 Occlusion and stenosis of right carotid artery: Secondary | ICD-10-CM

## 2021-05-09 NOTE — Patient Instructions (Signed)
Medication Instructions:  No changes *If you need a refill on your cardiac medications before your next appointment, please call your pharmacy*   Lab Work: None ordered If you have labs (blood work) drawn today and your tests are completely normal, you will receive your results only by: [place] (if you have MyChart) OR A paper copy in the mail If you have any lab test that is abnormal or we need to change your treatment, we will call you to review the results.   Testing/Procedures: None ordered   Follow-Up: At [facility], you and your health needs are our priority.  As part of our continuing mission to provide you with exceptional heart care, we have created designated Provider Care Teams.  These Care Teams include your primary Cardiologist (physician) and Advanced Practice Providers (APPs -  Physician Assistants and Nurse Practitioners) who all work together to provide you with the care you need, when you need it.  We recommend signing up for the patient portal called "MyChart".  Sign up information is provided on this After Visit Summary.  MyChart is used to connect with patients for Virtual Visits (Telemedicine).  Patients are able to view lab/test results, encounter notes, upcoming appointments, etc.  Non-urgent messages can be sent to your provider as well.   To learn more about what you can do with MyChart, go to [email or web address].    Your next appointment:   3 month(s)  The format for your next appointment:   In Person  Provider:   Kathlyn Sacramento, MD   Other Instructions A referral has been placed to Vein and Vascular Surgery

## 2021-05-09 NOTE — Progress Notes (Signed)
Cardiology Office Note   Date:  05/09/2021   ID:  Martin Archer, DOB [birth year], MRN [id number]  PCP:  Biagio Borg, MD  Cardiologist: Dr. Irish Lack  No chief complaint on file.     History of Present Illness: Martin Archer is a 60 y.o. male who is here today for follow-up visit regarding left subclavian artery stenosis status post recent stent placement.   He has known history of essential hypertension, previous tobacco use, carotid artery disease status post bilateral endarterectomy, chronic systolic heart failure, history of anemia due to GI bleed and chronic kidney disease. He is not diabetic and quit smoking in 2021. He had an echocardiogram done in April which showed an ejection fraction of 40 to 45% with global hypokinesis, severely dilated atria, mild mitral regurgitation mildly calcified aortic valve.  He underwent a right and left cardiac catheterization in June of this year which showed chronically occluded proximal right coronary artery with left-to-right collaterals and mild nonobstructive disease on the left side.  Medical therapy was recommended.  Carotid Doppler done in May showed an occluded left carotid artery with moderate disease on the right side.  Significant stenosis noted in the left subclavian artery with 37 mm of pressure difference between the arms and retrograde flow in the left vertebral artery.  The patient had highly symptomatic left arm claudication.  I proceeded with angiography 2 weeks ago which showed severe left subclavian artery stenosis.  I performed successful VBX covered stent placement to the left subclavian artery.  In addition, he was noted to have flush occlusion of the right SFA.Marland Kitchen  Lower extremity Dopplers in August showed an ABI of 0.62 on the right and 0.84 on the left.  Duplex showed borderline stenosis in the right external iliac artery with occluded right SFA.  On the left, there was moderate SFA disease.  Recent follow-up  carotid Doppler showed patent left subclavian artery stenosis.  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  However, there was significant progression of right ICA stenosis to greater than 80%.  He reports resolution of left arm claudication. He is bothered by right calf claudication with short distance walking as well as discomfort in the bottom of the feet.  She   Past Medical History:  Diagnosis Date   Anemia, unspecified    Arthritis    Diverticulosis of colon (without mention of hemorrhage)    Gastric ulcer with hemorrhage and obstruction 2011   Dr [facility]   GERD (gastroesophageal reflux disease)    hx   Hyperlipemia    Hypertension    Personal history of colonic polyps 01/30/2010   hyperplastic rectal   Sleep apnea    on CPAP; [facility]   Stroke [facility])    mini strokes-bilateral carotid endarterectomy    Past Surgical History:  Procedure Laterality Date   AORTIC ARCH ANGIOGRAPHY N/A 04/26/2021   Procedure: AORTIC ARCH ANGIOGRAPHY;  Surgeon: Wellington Hampshire, MD;  Location: [place];  Service: Cardiovascular;  Laterality: N/A;   CAROTID ENDARTERECTOMY  11/2006   left Dr Mamie Nick   COLONOSCOPY  2011   DP-positive FOBT/IDA   ENDARTERECTOMY Right 01/19/2015   Procedure: RIGHT CAROTID ENDARTERECTOMY WITH PATCH ANGIOPLASTY;  Surgeon: Mal Misty, MD;  Location: [facility];  Service: Vascular;  Laterality: Right;   ESOPHAGOGASTRODUODENOSCOPY Left 06/17/2013   Procedure: ESOPHAGOGASTRODUODENOSCOPY (EGD);  Surgeon: Arta Silence, MD;  Location: [facility] ENDOSCOPY;  Service: Endoscopy;  Laterality: Left;  ESOPHAGOGASTRODUODENOSCOPY N/A 09/20/2015   Procedure: ESOPHAGOGASTRODUODENOSCOPY (EGD);  Surgeon: Ladene Artist, MD;  Location: [facility] ENDOSCOPY;  Service: Endoscopy;  Laterality: N/A;   FOOT SURGERY Bilateral 2020   Taylor's foot   INGUINAL HERNIA REPAIR  1963   NASAL FRACTURE SURGERY  1974   PERIPHERAL VASCULAR INTERVENTION  04/26/2021   Procedure:  PERIPHERAL VASCULAR INTERVENTION;  Surgeon: Wellington Hampshire, MD;  Location: [place] [facility];  Service: Cardiovascular;;   RIGHT/LEFT HEART CATH AND CORONARY ANGIOGRAPHY N/A 01/25/2021   Procedure: RIGHT/LEFT HEART CATH AND CORONARY ANGIOGRAPHY;  Surgeon: Jettie Booze, MD;  Location: [place] [facility];  Service: Cardiovascular;  Laterality: N/A;   TEE WITHOUT CARDIOVERSION N/A 01/17/2015   Procedure: TRANSESOPHAGEAL ECHOCARDIOGRAM (TEE);  Surgeon: Sanda Klein, MD;  Location: [facility] ENDOSCOPY;  Service: Cardiovascular;  Laterality: N/A;   WISDOM TOOTH EXTRACTION       Current Outpatient Medications  Medication Sig Dispense Refill   acetaminophen (TYLENOL) 500 MG tablet Take 500-1,000 mg by mouth every 6 (six) hours as needed for moderate pain or headache.     aspirin EC 81 MG tablet Take 1 tablet (81 mg total) by mouth daily. Swallow whole. 30 tablet 11   atorvastatin (LIPITOR) 80 MG tablet TAKE 1 TABLET BY MOUTH DAILY 90 tablet 3   Carboxymethylcellul-Glycerin (LUBRICATING EYE DROPS OP) Place 1 drop into both eyes daily as needed (dry eyes).     carvedilol (COREG) 25 MG tablet Take 1 tablet (25 mg total) by mouth 2 (two) times daily. 180 tablet 3   clopidogrel (PLAVIX) 75 MG tablet Take 1 tablet (75 mg total) by mouth daily with breakfast. 30 tablet 2   doxylamine, Sleep, (UNISOM) 25 MG tablet Take 50 mg by mouth at bedtime as needed for sleep.     empagliflozin (JARDIANCE) 10 MG TABS tablet Take 1 tablet (10 mg total) by mouth daily before breakfast. 90 tablet 3   ezetimibe (ZETIA) 10 MG tablet Take 1 tablet (10 mg total) by mouth daily. 90 tablet 3   Multiple Vitamin (MULTIVITAMIN WITH MINERALS) TABS tablet Take 1 tablet by mouth daily.     sacubitril-valsartan (ENTRESTO) 97-103 MG Take 1 tablet by mouth 2 (two) times daily. 180 tablet 3   No current facility-administered medications for this visit.    Allergies:   Adderall [amphetamine-dextroamphet er], Prozac [fluoxetine hcl],  Sertraline hcl, and Advil [ibuprofen]    Social History:  The patient  reports that he quit smoking about 5 years ago. His smoking use included cigarettes. He smoked an average of .5 packs per day. He has never used smokeless tobacco. He reports that he does not currently use alcohol. He reports that he does not use drugs.   Family History:  The patient's family history includes Cancer in his maternal grandfather; Diabetes in his father and paternal grandfather; Heart attack in his mother; Heart attack (age of onset: 2) in his brother; Heart attack (age of onset: 51) in his maternal grandfather; Hypertension in his father; Stroke in his father and paternal uncle.    ROS:  Please see the history of present illness.   Otherwise, review of systems are positive for none.   All other systems are reviewed and negative.    PHYSICAL EXAM: VS:  Ht '5\' 8"'$  (1.727 m)   Wt 206 lb (93.4 kg)   SpO2 97%   BMI 31.32 kg/m  , BMI Body mass index is 31.32 kg/m. GEN: Well nourished, well developed, in no acute distress  HEENT: normal  Neck: no JVD, carotid bruits, or masses Cardiac: RRR; no murmurs, rubs, or gallops,no edema  Respiratory:  clear to auscultation bilaterally, normal work of breathing GI: soft, nontender, nondistended, + BS MS: no deformity or atrophy  Skin: warm and dry, no rash Neuro:  Strength and sensation are intact Psych: euthymic mood, full affect Vascular: Radial pulses +1 on the right and 0 on the left, brachial: +1 on the right and 0 on the left.  Femoral: +1 bilaterally.  Mild bruising in the right groin but no hematoma.   EKG:  EKG is ordered today. EKG shows normal sinus rhythm with left atrial enlargement with lateral T wave changes.   Recent Labs: 12/19/2020: TSH 1.76 04/11/2021: Hemoglobin 15.3; Platelets 257 04/13/2021: ALT 22; BUN 22; Creatinine, Ser 1.67; Potassium 5.0; Sodium 139    Lipid Panel    Component Value Date/Time   CHOL 124 04/13/2021 1018   CHOL 286  (H) 10/12/2013 0436   TRIG 177 (H) 04/13/2021 1018   TRIG 572 (H) 10/12/2013 0436   TRIG 284 (HH) 07/15/2006 1158   HDL 35 (L) 04/13/2021 1018   HDL 35 (L) 10/12/2013 0436   CHOLHDL 3.5 04/13/2021 1018   CHOLHDL 4 12/19/2020 1200   VLDL 32.2 12/19/2020 1200   LDLCALC 59 04/13/2021 1018   LDLCALC  03/23/2020 1601     Comment:     . LDL cholesterol not calculated. Triglyceride levels greater than 400 mg/dL invalidate calculated LDL results. . Reference range: <100 . Desirable range <100 mg/dL for primary prevention;   <70 mg/dL for patients with CHD or diabetic patients  with > or = 2 CHD risk factors. Marland Kitchen LDL-C is now calculated using the Martin-Hopkins  calculation, which is a validated novel method providing  better accuracy than the Friedewald equation in the  estimation of LDL-C.  Cresenciano Genre et al. Annamaria Helling. [id number]): 2061-2068  (http://education.[employer].com/faq/FAQ164)    LDLCALC NOT CALC 10/12/2013 0436   LDLDIRECT 90.0 09/26/2017 1715      Wt Readings from Last 3 Encounters:  05/09/21 206 lb (93.4 kg)  04/26/21 200 lb (90.7 kg)  04/13/21 206 lb 9.6 oz (93.7 kg)       No flowsheet data found.    ASSESSMENT AND PLAN:  1.  Left subclavian artery stenosis: Status post recent successful covered stent placement with resolution of left arm claudication.  The stent was patent by recent Doppler.  2.  Carotid artery stenosis: Occluded left carotid artery with progression of right carotid stenosis to greater than 80%.  I referred the patient to [facility] for revascularization.  Clopidogrel can be held for the surgery.  3.  Coronary artery disease involving native coronary arteries without angina: He has no anginal symptoms.  Continue medical therapy.  No need for ischemic cardiac evaluation before carotid surgery.  4.  Chronic systolic heart failure: He appears to be euvolemic and currently on carvedilol, Entresto and Jardiance.  5.  Peripheral arterial disease: The  patient has evidence of bilateral leg claudication worse on the right side.  His right SFA is occluded.  The patient is interested in revascularization but prefer to try medical therapy first.  We will reevaluate his symptoms in 3 months.  He might have some inflow disease that can be treated.    Disposition:   FU with me in 3 months  Signed,  Kathlyn Sacramento, MD  05/09/2021 9:57 AM    [place]

## 2021-05-11 ENCOUNTER — Other Ambulatory Visit (HOSPITAL_BASED_OUTPATIENT_CLINIC_OR_DEPARTMENT_OTHER): Payer: Self-pay

## 2021-05-11 ENCOUNTER — Other Ambulatory Visit (HOSPITAL_COMMUNITY): Payer: Self-pay

## 2021-05-11 ENCOUNTER — Telehealth (HOSPITAL_BASED_OUTPATIENT_CLINIC_OR_DEPARTMENT_OTHER): Payer: Self-pay | Admitting: Pharmacist

## 2021-05-11 NOTE — Telephone Encounter (Signed)
Pharmacy Transitions of Care Follow-up Telephone Call  Date of discharge: 04/27/2021  Discharge Diagnosis: Stenosis of left subclavian artery   How have you been since you were released from the hospital? Doing well since discharge. No medication related side effects noticed. No complaints or concerns today.    Medication changes made at discharge:  - START: clopidogrel  Medication changes verified by the patient? Yes (Yes/No)    Medication Accessibility:  Home Pharmacy: [facility] [phone]   Was the patient provided with refills on discharged medications? Yes   Have all prescriptions been transferred from [facility] to home pharmacy? Completed now.   Is the patient able to afford medications? Yes - commercially insured    Medication Review:  CLOPIDOGREL (PLAVIX) Clopidogrel 75 mg once daily.  - Reviewed potential DDIs with patient  - Advised patient of medications to avoid (NSAIDs, ASA)  - Educated that Tylenol (acetaminophen) will be the preferred analgesic to prevent risk of bleeding  - Emphasized importance of monitoring for signs and symptoms of bleeding (abnormal bruising, prolonged bleeding, nose bleeds, bleeding from gums, discolored urine, black tarry stools)  - Advised patient to alert all providers of anticoagulation therapy prior to starting a new medication or having a procedure    Follow-up Appointments:  Next cardiology appointment confirmed for October 3rd, 2022.   If their condition worsens, is the pt aware to call PCP or go to the Emergency Dept.? YES  Harriet Pho, PharmD [place] [facility]  05/11/2021 12:13 PM

## 2021-05-17 ENCOUNTER — Telehealth: Payer: Self-pay | Admitting: Internal Medicine

## 2021-05-17 NOTE — Telephone Encounter (Signed)
  Patient calling upset because Dr Jenny Reichmann did not refill Atorvastatin, advised to call Dr Irish Lack to request refill. Patient got angry and  hung up the phone before pharmacy could be verified.  Patient calling to request refill for atorvastatin (LIPITOR) 80 MG tablet

## 2021-05-18 NOTE — Telephone Encounter (Signed)
Dr. Irish Lack refilled meds on 04/13/21 with #90.  He added 3 additional refills so patient should pick up script sent in August if he has not done so already.  No refills will be sent in. He does not need them.

## 2021-05-20 ENCOUNTER — Other Ambulatory Visit: Payer: Self-pay | Admitting: Cardiology

## 2021-05-24 ENCOUNTER — Telehealth: Payer: Self-pay | Admitting: Cardiovascular Disease

## 2021-05-24 NOTE — Telephone Encounter (Signed)
Called and attempted to talk to the patient about his pain but before anything could be said he started yelling. He stated that he has been in extreme pain since this weekend. He was advised if he is staying in constant pain then he should go to an urgent care or ED for further assessment. He became irate and started yelling saying he needs to be seen here in the office and given pain medication immediately. He then proceeded to hang up.

## 2021-05-24 NOTE — Telephone Encounter (Signed)
I agree with you.  This does not sound to be vascular in etiology.  I wonder if he has a hernia or if he pulled a muscle there.  He should see his primary care physician.

## 2021-05-24 NOTE — Telephone Encounter (Signed)
  Pt said his right groin is swelling and very painful, he said he can't even lift his right leg because it is so painful

## 2021-05-24 NOTE — Telephone Encounter (Signed)
Called the patient concerning his right groin pain. He stated that he did a lot of walking over the weekend and developed right groin pain and right hand swelling.   The swelling in his hand has gotten better. He has been advised to keep it elevated when possible. He denies any pain or discoloration in that hand.  He stated that he also developed right groin pain. The pain has not gotten any better. He has tried Tylenol with no relief. The pain is constant and hurts more when he ambulates and when he coughs. He stated that he can bend his leg without pain. It does not radiate but stays in the groin area. He stated that the area is not swollen, no hard knots and no discoloration.   He was asked if he felt ike he pulled something over the weekend but he was not sure. He was concerned that he "messed up" something from his procedure in August. He has been advised that it does not sound related especially since it hurts worse when he coughs.   He would like to have Dr. Tyrell Antonio recommendations. He also wanted to know if he could have something for pain. He was advised that he may need to follow up with his PCP for this.

## 2021-05-24 NOTE — Telephone Encounter (Signed)
That is unfortunate.  If he is really having that much pain, he should be evaluated in the ED or urgent care.

## 2021-05-26 ENCOUNTER — Ambulatory Visit: Payer: Medicare Other | Admitting: Internal Medicine

## 2021-05-28 NOTE — Progress Notes (Addendum)
Cardiology Office Note   Date:  05/29/2021   ID:  Martin Archer, DOB [birth year], MRN [id number]  PCP:  Biagio Borg, MD    No chief complaint on file.  CAD  Wt Readings from Last 3 Encounters:  05/29/21 206 lb (93.4 kg)  05/09/21 206 lb (93.4 kg)  04/26/21 200 lb (90.7 kg)       History of Present Illness: Martin Archer is a 60 y.o. male  who is being seen today for the evaluation of HTN and abnormal echo at the request of Biagio Borg, MD.   Prior h/o anemia, GI bleed, ulcer, CKD.  Lisinopril was increased in early 2022 for BP control.    11/2020 echo showed: "Left ventricular ejection fraction, by estimation, is 40 to 45%. The  left ventricle has mildly decreased function. The left ventricle  demonstrates global hypokinesis. The left ventricular internal cavity size  was moderately dilated. Left ventricular  diastolic parameters are consistent with Grade III diastolic dysfunction  (restrictive). Elevated left ventricular end-diastolic pressure.   2. Right ventricular systolic function is normal. The right ventricular  size is normal. There is mildly elevated pulmonary artery systolic  pressure. The estimated right ventricular systolic pressure is [id number] mmHg.   3. Left atrial size was severely dilated.   4. Right atrial size was severely dilated.   5. The mitral valve is normal in structure. Mild mitral valve  regurgitation. No evidence of mitral stenosis.   6. The aortic valve has an indeterminant number of cusps. Aortic valve  regurgitation is not visualized. Mild to moderate aortic valve  sclerosis/calcification is present, without any evidence of aortic  stenosis.   7. Aortic dilatation noted. There is mild dilatation of the aortic root,  measuring 38 mm. There is mild dilatation of the ascending aorta,  measuring 43 mm.   8. The inferior vena cava is normal in size with greater than 50%  respiratory variability, suggesting right atrial pressure  of 3 mmHg.   9. Compared to prior echo, LVF has declined."   He was having fatigue over the past several months.    He thinks he was told his heart was weak in the past. Records show from [id number]: "Acute systolic congestive heart failure with ejection fraction of 40%. Continue lisinopril and metoprolol. Continue low-dose oral Lasix. Seen by cardiology. Follow-up with cardiology at [facility] at discharge. Patient will need further evaluation in the form of a repeat echo and/ or coronary angiogram. Also had elevated troponin which was likely noncardiac."  He was having alcohol abuse issues at that time.   Cath in June 2022 showed: "Prox RCA lesion is 100% stenosed. Left to right collaterals. 1st Diag lesion is 25% stenosed. 3rd Mrg lesion is 70% stenosed. LV end diastolic pressure is normal. There is no aortic valve stenosis. Hemodynamic findings consistent with mild pulmonary hypertension. Ao sat 94%, PA sat 67%, mean PA pressure 27 mm Hg; mean PCWP 13 mm Hg; CO 4.6 L/min; CI 2.26   LV dysfuction out of proportion to the degree of CAD.   Medical therapy for heart failure. "  For severe left arm claudication, he had PAD eval with Dr. Fletcher Anon: " VBX covered stent placement to the left subclavian artery.  In addition, he was noted to have flush occlusion of the right SFA....  follow-up carotid Doppler showed patent left subclavian artery stenosis.  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  However, there was significant progression  of right ICA stenosis to greater than 80%."   He also reports that he had severe left arm pain and weakness at that time. He had right groin pain and called the office on 05/24/2021.  Left arm improved.  Saw GP on Friday 9/30.  Had bilateral hand swelling.  Now it is in the left.     Past Medical History:  Diagnosis Date   Anemia, unspecified    Arthritis    Diverticulosis of colon (without mention of hemorrhage)    Gastric ulcer with hemorrhage and obstruction 2011    Dr [facility]   GERD (gastroesophageal reflux disease)    hx   Hyperlipemia    Hypertension    Personal history of colonic polyps 01/30/2010   hyperplastic rectal   Sleep apnea    on CPAP; [facility]   Stroke [facility])    mini strokes-bilateral carotid endarterectomy    Past Surgical History:  Procedure Laterality Date   AORTIC ARCH ANGIOGRAPHY N/A 04/26/2021   Procedure: AORTIC ARCH ANGIOGRAPHY;  Surgeon: Wellington Hampshire, MD;  Location: [place] [facility];  Service: Cardiovascular;  Laterality: N/A;   CAROTID ENDARTERECTOMY  11/2006   left Dr Mamie Nick   COLONOSCOPY  2011   DP-positive FOBT/IDA   ENDARTERECTOMY Right 01/19/2015   Procedure: RIGHT CAROTID ENDARTERECTOMY WITH PATCH ANGIOPLASTY;  Surgeon: Mal Misty, MD;  Location: [place];  Service: Vascular;  Laterality: Right;   ESOPHAGOGASTRODUODENOSCOPY Left 06/17/2013   Procedure: ESOPHAGOGASTRODUODENOSCOPY (EGD);  Surgeon: Arta Silence, MD;  Location: [facility] ENDOSCOPY;  Service: Endoscopy;  Laterality: Left;   ESOPHAGOGASTRODUODENOSCOPY N/A 09/20/2015   Procedure: ESOPHAGOGASTRODUODENOSCOPY (EGD);  Surgeon: Ladene Artist, MD;  Location: [facility] ENDOSCOPY;  Service: Endoscopy;  Laterality: N/A;   FOOT SURGERY Bilateral 2020   Taylor's foot   INGUINAL HERNIA REPAIR  1963   NASAL FRACTURE SURGERY  1974   PERIPHERAL VASCULAR INTERVENTION  04/26/2021   Procedure: PERIPHERAL VASCULAR INTERVENTION;  Surgeon: Wellington Hampshire, MD;  Location: [place] [facility];  Service: Cardiovascular;;   RIGHT/LEFT HEART CATH AND CORONARY ANGIOGRAPHY N/A 01/25/2021   Procedure: RIGHT/LEFT HEART CATH AND CORONARY ANGIOGRAPHY;  Surgeon: Jettie Booze, MD;  Location: [place] [facility];  Service: Cardiovascular;  Laterality: N/A;   TEE WITHOUT CARDIOVERSION N/A 01/17/2015   Procedure: TRANSESOPHAGEAL ECHOCARDIOGRAM (TEE);  Surgeon: Sanda Klein, MD;  Location: [facility] ENDOSCOPY;  Service: Cardiovascular;  Laterality: N/A;   WISDOM TOOTH  EXTRACTION       Current Outpatient Medications  Medication Sig Dispense Refill   acetaminophen (TYLENOL) 500 MG tablet Take 500-1,000 mg by mouth every 6 (six) hours as needed for moderate pain or headache.     aspirin EC 81 MG tablet Take 1 tablet (81 mg total) by mouth daily. Swallow whole. 30 tablet 11   atorvastatin (LIPITOR) 80 MG tablet TAKE 1 TABLET BY MOUTH DAILY 90 tablet 3   Carboxymethylcellul-Glycerin (LUBRICATING EYE DROPS OP) Place 1 drop into both eyes daily as needed (dry eyes).     carvedilol (COREG) 25 MG tablet Take 1 tablet (25 mg total) by mouth 2 (two) times daily. 180 tablet 3   clopidogrel (PLAVIX) 75 MG tablet TAKE 1 TABLET BY MOUTH DAILY WITH BREAKFAST. 90 tablet 3   doxylamine, Sleep, (UNISOM) 25 MG tablet Take 50 mg by mouth at bedtime as needed for sleep.     empagliflozin (JARDIANCE) 10 MG TABS tablet Take 1 tablet (10 mg total) by mouth daily before breakfast. 90 tablet  3   ezetimibe (ZETIA) 10 MG tablet Take 1 tablet (10 mg total) by mouth daily. 90 tablet 3   furosemide (LASIX) 20 MG tablet Take 1 tablet (20 mg total) by mouth daily as needed. For lower extremity swelling 90 tablet 3   Multiple Vitamin (MULTIVITAMIN WITH MINERALS) TABS tablet Take 1 tablet by mouth daily.     sacubitril-valsartan (ENTRESTO) 97-103 MG Take 1 tablet by mouth 2 (two) times daily. 180 tablet 3   No current facility-administered medications for this visit.    Allergies:   Adderall [amphetamine-dextroamphet er], Prozac [fluoxetine hcl], Sertraline hcl, and Advil [ibuprofen]    Social History:  The patient  reports that he quit smoking about 5 years ago. His smoking use included cigarettes. He smoked an average of .5 packs per day. He has never used smokeless tobacco. He reports that he does not currently use alcohol. He reports that he does not use drugs.   Family History:  The patient's family history includes Cancer in his maternal grandfather; Diabetes in his father and  paternal grandfather; Heart attack in his mother; Heart attack (age of onset: 9) in his brother; Heart attack (age of onset: 90) in his maternal grandfather; Hypertension in his father; Stroke in his father and paternal uncle.    ROS:  Please see the history of present illness.   Otherwise, review of systems are positive for left hand swelling.   All other systems are reviewed and negative.    PHYSICAL EXAM: VS:  BP (!) 150/104   Pulse 92   Ht '5\' 8"'$  (1.727 m)   Wt 206 lb (93.4 kg)   SpO2 98%   BMI 31.32 kg/m  , BMI Body mass index is 31.32 kg/m. GEN: Well nourished, well developed, in no acute distress HEENT: normal Neck: no JVD, carotid bruits, or masses Cardiac: RRR; no murmurs, rubs, or gallops,; left hand and wrist edema  Respiratory:  clear to auscultation bilaterally, normal work of breathing GI: soft, nontender, nondistended, + BS MS: no deformity or atrophy; no right groin swelling- 1+ femoral pulses bilaterally Skin: warm and dry, no rash Neuro:  Strength and sensation are intact; normal strength in upper and lower extremities, 5/5 bilaterally Psych: euthymic mood, full affect    Recent Labs: 12/19/2020: TSH 1.76 04/11/2021: Hemoglobin 15.3; Platelets 257 04/13/2021: ALT 22; BUN 22; Creatinine, Ser 1.67; Potassium 5.0; Sodium 139   Lipid Panel    Component Value Date/Time   CHOL 124 04/13/2021 1018   CHOL 286 (H) 10/12/2013 0436   TRIG 177 (H) 04/13/2021 1018   TRIG 572 (H) 10/12/2013 0436   TRIG 284 (HH) 07/15/2006 1158   HDL 35 (L) 04/13/2021 1018   HDL 35 (L) 10/12/2013 0436   CHOLHDL 3.5 04/13/2021 1018   CHOLHDL 4 12/19/2020 1200   VLDL 32.2 12/19/2020 1200   LDLCALC 59 04/13/2021 1018   LDLCALC  03/23/2020 1601     Comment:     . LDL cholesterol not calculated. Triglyceride levels greater than 400 mg/dL invalidate calculated LDL results. . Reference range: <100 . Desirable range <100 mg/dL for primary prevention;   <70 mg/dL for patients with CHD  or diabetic patients  with > or = 2 CHD risk factors. Marland Kitchen LDL-C is now calculated using the Martin-Hopkins  calculation, which is a validated novel method providing  better accuracy than the Friedewald equation in the  estimation of LDL-C.  Cresenciano Genre et al. Annamaria Helling. [id number]): 2061-2068  (http://education.[employer].com/faq/FAQ164)    LDLCALC NOT  CALC 10/12/2013 0436   LDLDIRECT 90.0 09/26/2017 1715     Other studies Reviewed: Additional studies/ records that were reviewed today with results demonstrating: records reviewed; LDL 59.   ASSESSMENT AND PLAN:  CAD: No angina on medical therapy. Continue aggressive secondary prevention.  Chronic systolic heart failure: He had swelling in both hands which is now in the left hand mostly.  Check left UE venous ultrasound.  May be a more localized joint phenomenon, but the entire left hand and wrist are swollen.  He is seeing his primary care doctor who could evaluate him for gout.  Prescription called in for as needed Lasix if he has lower extremity swelling. 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 To [facility] for carotid revasc later this week. PAD: SFA occlusion managed medically for now.    Current medicines are reviewed at length with the patient today.  The patient concerns regarding his medicines were addressed.  The following changes have been made:  No change  Labs/ tests ordered today include:   Orders Placed This Encounter  Procedures   VAS [place] UPPER EXTREMITY VENOUS DUPLEX    Recommend 150 minutes/week of aerobic exercise Low fat, low carb, high fiber diet recommended  Disposition:   FU in 1 year   Signed, Larae Grooms, MD  05/29/2021 9:45 AM    [facility] [address], [place],  [place]  [address] Phone: [phone]; Fax: [phone]

## 2021-05-29 ENCOUNTER — Ambulatory Visit (HOSPITAL_COMMUNITY)
Admission: RE | Admit: 2021-05-29 | Discharge: 2021-05-29 | Disposition: A | Discharge status: Home / Self Care | Payer: Medicare Other | Source: Ambulatory Visit | Attending: Cardiology | Admitting: Cardiology

## 2021-05-29 ENCOUNTER — Ambulatory Visit (INDEPENDENT_AMBULATORY_CARE_PROVIDER_SITE_OTHER): Payer: Medicare Other | Admitting: Internal Medicine

## 2021-05-29 ENCOUNTER — Ambulatory Visit (INDEPENDENT_AMBULATORY_CARE_PROVIDER_SITE_OTHER): Payer: Medicare Other | Admitting: Interventional Cardiology

## 2021-05-29 ENCOUNTER — Encounter: Payer: Self-pay | Admitting: Interventional Cardiology

## 2021-05-29 ENCOUNTER — Encounter: Payer: Self-pay | Admitting: Internal Medicine

## 2021-05-29 ENCOUNTER — Other Ambulatory Visit: Payer: Self-pay

## 2021-05-29 VITALS — BP 132/80 | HR 68 | Temp 98.3°F | Ht 68.0 in | Wt 208.0 lb

## 2021-05-29 VITALS — BP 150/104 | HR 92 | Ht 68.0 in | Wt 206.0 lb

## 2021-05-29 DIAGNOSIS — Z23 Encounter for immunization: Secondary | ICD-10-CM

## 2021-05-29 DIAGNOSIS — I2510 Atherosclerotic heart disease of native coronary artery without angina pectoris: Secondary | ICD-10-CM

## 2021-05-29 DIAGNOSIS — I700 Atherosclerosis of aorta: Secondary | ICD-10-CM | POA: Diagnosis not present

## 2021-05-29 DIAGNOSIS — M79602 Pain in left arm: Secondary | ICD-10-CM | POA: Diagnosis present

## 2021-05-29 DIAGNOSIS — I739 Peripheral vascular disease, unspecified: Secondary | ICD-10-CM

## 2021-05-29 DIAGNOSIS — I6523 Occlusion and stenosis of bilateral carotid arteries: Secondary | ICD-10-CM | POA: Diagnosis not present

## 2021-05-29 DIAGNOSIS — M7989 Other specified soft tissue disorders: Secondary | ICD-10-CM

## 2021-05-29 DIAGNOSIS — N1831 Chronic kidney disease, stage 3a: Secondary | ICD-10-CM | POA: Diagnosis not present

## 2021-05-29 DIAGNOSIS — M79643 Pain in unspecified hand: Secondary | ICD-10-CM

## 2021-05-29 DIAGNOSIS — I10 Essential (primary) hypertension: Secondary | ICD-10-CM

## 2021-05-29 DIAGNOSIS — I5022 Chronic systolic (congestive) heart failure: Secondary | ICD-10-CM

## 2021-05-29 MED ORDER — METHYLPREDNISOLONE ACETATE 80 MG/ML IJ SUSP
80.0000 mg | Freq: Once | INTRAMUSCULAR | Status: AC
  Administered 2021-05-29: 80 mg via INTRAMUSCULAR

## 2021-05-29 MED ORDER — FUROSEMIDE 20 MG PO TABS: 20.0000 mg | ORAL_TABLET | Freq: Every day | ORAL | 3 refills | Status: DC | PRN

## 2021-05-29 MED ORDER — PREDNISONE 10 MG PO TABS: ORAL_TABLET | ORAL | 0 refills | Status: DC

## 2021-05-29 NOTE — Progress Notes (Signed)
Patient ID: Martin Archer, male   DOB: 1961-08-17, 60 y.o.   MRN: [id number]        Chief Complaint: follow up bilateral hand pain and swelling       HPI:  Martin Archer is a 60 y.o. male here with chronic hearing loss and difficult historian, with several days first right then left hand swelling and mild discomfort.  Asked per cardiology to present here.  Pt denies chest pain, increased sob or doe, wheezing, orthopnea, PND, increased LE swelling, palpitations, dizziness or syncope.   Pt denies polydipsia, polyuria, or new focal neuro s/s.   Pt denies fever, wt loss, night sweats, loss of appetite, or other constitutional symptoms  No overlying skin chagnes.       LUE neg doppler DVT ruled out by report.  No prior hx of gout  Due for flu shot Wt Readings from Last 3 Encounters:  05/29/21 208 lb (94.3 kg)  05/29/21 206 lb (93.4 kg)  05/09/21 206 lb (93.4 kg)   BP Readings from Last 3 Encounters:  05/29/21 132/80  05/29/21 (!) 150/104  05/09/21 112/78         Past Medical History:  Diagnosis Date   Anemia, unspecified    Arthritis    Diverticulosis of colon (without mention of hemorrhage)    Gastric ulcer with hemorrhage and obstruction 2011   Dr [facility]   GERD (gastroesophageal reflux disease)    hx   Hyperlipemia    Hypertension    Personal history of colonic polyps 01/30/2010   hyperplastic rectal   Sleep apnea    on CPAP; [facility]   Stroke [facility])    mini strokes-bilateral carotid endarterectomy   Past Surgical History:  Procedure Laterality Date   AORTIC ARCH ANGIOGRAPHY N/A 04/26/2021   Procedure: AORTIC ARCH ANGIOGRAPHY;  Surgeon: Wellington Hampshire, MD;  Location: [place] [facility];  Service: Cardiovascular;  Laterality: N/A;   CAROTID ENDARTERECTOMY  11/2006   left Dr Mamie Nick   COLONOSCOPY  2011   DP-positive FOBT/IDA   ENDARTERECTOMY Right 01/19/2015   Procedure: RIGHT CAROTID ENDARTERECTOMY WITH PATCH ANGIOPLASTY;  Surgeon:  Mal Misty, MD;  Location: [place];  Service: Vascular;  Laterality: Right;   ESOPHAGOGASTRODUODENOSCOPY Left 06/17/2013   Procedure: ESOPHAGOGASTRODUODENOSCOPY (EGD);  Surgeon: Arta Silence, MD;  Location: [facility] ENDOSCOPY;  Service: Endoscopy;  Laterality: Left;   ESOPHAGOGASTRODUODENOSCOPY N/A 09/20/2015   Procedure: ESOPHAGOGASTRODUODENOSCOPY (EGD);  Surgeon: Ladene Artist, MD;  Location[facility] ENDOSCOPY;  Service: Endoscopy;  Laterality: N/A;   FOOT SURGERY Bilateral 2020   Taylor's foot   INGUINAL HERNIA REPAIR  1963   NASAL FRACTURE SURGERY  1974   PERIPHERAL VASCULAR INTERVENTION  04/26/2021   Procedure: PERIPHERAL VASCULAR INTERVENTION;  Surgeon: Wellington Hampshire, MD;  Location: [place] [facility];  Service: Cardiovascular;;   RIGHT/LEFT HEART CATH AND CORONARY ANGIOGRAPHY N/A 01/25/2021   Procedure: RIGHT/LEFT HEART CATH AND CORONARY ANGIOGRAPHY;  Surgeon: Jettie Booze, MD;  Location: [place] [facility];  Service: Cardiovascular;  Laterality: N/A;   TEE WITHOUT CARDIOVERSION N/A 01/17/2015   Procedure: TRANSESOPHAGEAL ECHOCARDIOGRAM (TEE);  Surgeon: Sanda Klein, MD;  Location: [facility] ENDOSCOPY;  Service: Cardiovascular;  Laterality: N/A;   WISDOM TOOTH EXTRACTION      reports that he quit smoking about 5 years ago. His smoking use included cigarettes. He smoked an average of .5 packs per day. He has never used smokeless tobacco. He reports that he does not currently  use alcohol. He reports that he does not use drugs. family history includes Cancer in his maternal grandfather; Diabetes in his father and paternal grandfather; Heart attack in his mother; Heart attack (age of onset: 57) in his brother; Heart attack (age of onset: 46) in his maternal grandfather; Hypertension in his father; Stroke in his father and paternal uncle. Allergies  Allergen Reactions   Adderall [Amphetamine-Dextroamphet Er] Diarrhea   Prozac [Fluoxetine Hcl] Diarrhea   Sertraline Hcl Diarrhea   Advil  [Ibuprofen] Diarrhea   Current Outpatient Medications on File Prior to Visit  Medication Sig Dispense Refill   acetaminophen (TYLENOL) 500 MG tablet Take 500-1,000 mg by mouth every 6 (six) hours as needed for moderate pain or headache.     aspirin EC 81 MG tablet Take 1 tablet (81 mg total) by mouth daily. Swallow whole. 30 tablet 11   atorvastatin (LIPITOR) 80 MG tablet TAKE 1 TABLET BY MOUTH DAILY 90 tablet 3   Carboxymethylcellul-Glycerin (LUBRICATING EYE DROPS OP) Place 1 drop into both eyes daily as needed (dry eyes).     carvedilol (COREG) 25 MG tablet Take 1 tablet (25 mg total) by mouth 2 (two) times daily. 180 tablet 3   clopidogrel (PLAVIX) 75 MG tablet TAKE 1 TABLET BY MOUTH DAILY WITH BREAKFAST. 90 tablet 3   doxylamine, Sleep, (UNISOM) 25 MG tablet Take 50 mg by mouth at bedtime as needed for sleep.     empagliflozin (JARDIANCE) 10 MG TABS tablet Take 1 tablet (10 mg total) by mouth daily before breakfast. 90 tablet 3   ezetimibe (ZETIA) 10 MG tablet Take 1 tablet (10 mg total) by mouth daily. 90 tablet 3   furosemide (LASIX) 20 MG tablet Take 1 tablet (20 mg total) by mouth daily as needed. For lower extremity swelling 90 tablet 3   Multiple Vitamin (MULTIVITAMIN WITH MINERALS) TABS tablet Take 1 tablet by mouth daily.     sacubitril-valsartan (ENTRESTO) 97-103 MG Take 1 tablet by mouth 2 (two) times daily. 180 tablet 3   No current facility-administered medications on file prior to visit.        ROS:  All others reviewed and negative.  Objective        PE:  BP 132/80 (BP Location: Right Arm, Patient Position: Sitting, Cuff Size: Large)   Pulse 68   Temp 98.3 F (36.8 C) (Oral)   Ht '5\' 8"'$  (1.727 m)   Wt 208 lb (94.3 kg)   SpO2 100%   BMI 31.63 kg/m                 Constitutional: Pt appears in NAD               HENT: Head: NCAT.                Right Ear: External ear normal.                 Left Ear: External ear normal.                Eyes: . Pupils are equal,  round, and reactive to light. Conjunctivae and EOM are normal               Nose: without d/c or deformity               Neck: Neck supple. Gross normal ROM               Cardiovascular: Normal rate and regular rhythm.  Pulmonary/Chest: Effort normal and breath sounds without rales or wheezing.                Abd:  Soft, NT, ND, + BS, no organomegaly               Neurological: Pt is alert. At baseline orientation, motor grossly intact               Skin:  LE edema - none, but has trace to 1+ diffuse left hand mostly MCP sweling and minor tender, as well as 1-2+ diffuse left hand swelling with minor discomfort only               Psychiatric: Pt behavior is normal without agitation   Micro: none  Cardiac tracings I have personally interpreted today:  none  Pertinent Radiological findings (summarize): none   Lab Results  Component Value Date   WBC 11.4 (H) 04/11/2021   HGB 15.3 04/11/2021   HCT 44.9 04/11/2021   PLT 257 04/11/2021   GLUCOSE 87 04/13/2021   CHOL 124 04/13/2021   TRIG 177 (H) 04/13/2021   HDL 35 (L) 04/13/2021   LDLDIRECT 90.0 09/26/2017   LDLCALC 59 04/13/2021   ALT 22 04/13/2021   AST 24 04/13/2021   NA 139 04/13/2021   K 5.0 04/13/2021   CL 103 04/13/2021   CREATININE 1.67 (H) 04/13/2021   BUN 22 04/13/2021   CO2 22 04/13/2021   TSH 1.76 12/19/2020   PSA 0.60 12/19/2020   INR 1.07 09/18/2015   HGBA1C 5.5 12/19/2020   MICROALBUR 0.8 07/15/2006   Assessment/Plan:  Martin Archer is a 60 y.o. White or Caucasian [1] male with  has a past medical history of Anemia, unspecified, Arthritis, Diverticulosis of colon (without mention of hemorrhage), Gastric ulcer with hemorrhage and obstruction (2011), GERD (gastroesophageal reflux disease), Hyperlipemia, Hypertension, Personal history of colonic polyps (01/30/2010), Sleep apnea, and Stroke ([place]).  Intermittent pain and swelling of hand Has right, then left hand marked swelling, DVt ruled out,  mild pain only but suspect acute gout - for depomedrol IM 80, then predpac asd  Essential hypertension BP Readings from Last 3 Encounters:  05/29/21 132/80  05/29/21 (!) 150/104  05/09/21 112/78   Stable, pt to continue medical treatment coreg, entresto   CKD (chronic kidney disease) stage 3, GFR 30-59 ml/min Lab Results  Component Value Date   CREATININE 1.67 (H) 04/13/2021   Stable overall, cont to avoid nephrotoxins  Followup: Return in about 6 months (around 11/27/2021), or if symptoms worsen or fail to improve.  [name], MD 05/29/2021 9:28 PM [facility]

## 2021-05-29 NOTE — Assessment & Plan Note (Signed)
BP Readings from Last 3 Encounters:  05/29/21 132/80  05/29/21 (!) 150/104  05/09/21 112/78   Stable, pt to continue medical treatment coreg, entresto

## 2021-05-29 NOTE — Assessment & Plan Note (Signed)
Has right, then left hand marked swelling, DVt ruled out, mild pain only but suspect acute gout - for depomedrol IM 80, then predpac asd

## 2021-05-29 NOTE — Patient Instructions (Signed)
You had the steroid shot today  Please take all new medication as prescribed - the prednisone  Please continue all other medications as before, and refills have been done if requested.  Please have the pharmacy call with any other refills you may need.  Please continue your efforts at being more active, low cholesterol diet, and weight control.  Please keep your appointments with your specialists as you may have planned  Please make an Appointment to return in 6 months, or sooner if needed

## 2021-05-29 NOTE — Assessment & Plan Note (Signed)
Lab Results  Component Value Date   CREATININE 1.67 (H) 04/13/2021   Stable overall, cont to avoid nephrotoxins

## 2021-05-29 NOTE — Patient Instructions (Signed)
Medication Instructions:  Your physician recommends that you continue on your current medications as directed. Please refer to the Current Medication list given to you today. May take furosemide 20 mg by mouth daily as needed for lower extremity swelling *If you need a refill on your cardiac medications before your next appointment, please call your pharmacy*   Lab Work: none If you have labs (blood work) drawn today and your tests are completely normal, you will receive your results only by: [place] (if you have MyChart) OR A paper copy in the mail If you have any lab test that is abnormal or we need to change your treatment, we will call you to review the results.   Testing/Procedures: Venous doppler study of left arm to be done at [facility] office this morning   Follow-Up: At [facility], you and your health needs are our priority.  As part of our continuing mission to provide you with exceptional heart care, we have created designated Provider Care Teams.  These Care Teams include your primary Cardiologist (physician) and Advanced Practice Providers (APPs -  Physician Assistants and Nurse Practitioners) who all work together to provide you with the care you need, when you need it.  We recommend signing up for the patient portal called "MyChart".  Sign up information is provided on this After Visit Summary.  MyChart is used to connect with patients for Virtual Visits (Telemedicine).  Patients are able to view lab/test results, encounter notes, upcoming appointments, etc.  Non-urgent messages can be sent to your provider as well.   To learn more about what you can do with MyChart, go to [email or web address].    Your next appointment:   6 month(s)  The format for your next appointment:   In Person  Provider:   You may see Larae Grooms, MD or one of the following Advanced Practice Providers on your designated Care Team:   Melina Copa, PA-C Ermalinda Barrios,  PA-C   Other Instructions

## 2021-06-01 ENCOUNTER — Encounter: Payer: Self-pay | Admitting: Vascular Surgery

## 2021-06-01 ENCOUNTER — Other Ambulatory Visit: Payer: Self-pay

## 2021-06-01 ENCOUNTER — Ambulatory Visit (INDEPENDENT_AMBULATORY_CARE_PROVIDER_SITE_OTHER): Payer: Medicare Other | Admitting: Vascular Surgery

## 2021-06-01 VITALS — BP 169/113 | HR 83 | Temp 98.0°F | Resp 20 | Ht 68.0 in | Wt 202.0 lb

## 2021-06-01 DIAGNOSIS — I6523 Occlusion and stenosis of bilateral carotid arteries: Secondary | ICD-10-CM

## 2021-06-01 NOTE — Progress Notes (Signed)
ASSESSMENT & PLAN   BILATERAL CAROTID DISEASE: This patient has undergone previous bilateral carotid endarterectomies by Dr. Kellie Simmering.  The left carotid is chronically occluded.  The right carotid has now progressed to greater than 80%.  He is asymptomatic.  Given the severity of the stenosis I would recommend addressing this in order to lower his risk of future stroke.  Given that this would be a redo operation I think I would favor trams carotid revascularization versus redo surgery if possible.  For this reason I have ordered a CT angiogram of the head and neck and then I will see him back after to discuss these results.  Hopefully we can schedule him for surgery soon after that.  He is on aspirin, Plavix, and a statin.   REASON FOR CONSULT:    Right carotid stenosis.  The consult is requested by Dr. Fletcher Anon.  HPI:   Martin Archer is a 60 y.o. male who underwent a right carotid endarterectomy by Dr. Kellie Simmering on 01/19/2015.  He had undergone a previous left carotid endarterectomy in 2008 also by Dr. Kellie Simmering.  He was last seen in our office by Dr. Kellie Simmering in 2016 and then was lost to follow-up.  His peripheral vascular disease is followed by Dr. Fletcher Anon.  He has a known left carotid occlusion.  His duplex scan on 05/05/2021 showed a greater than 80% right carotid stenosis in addition to the left carotid occlusion.  He was sent for vascular consultation.  On my history, the patient denies any recent history of stroke, TIAs, expressive or receptive aphasia, or amaurosis fugax.  He is on aspirin, Plavix, and a statin.  He underwent a subclavian stent in September and was started on Plavix at that time.  His risk factors for peripheral vascular disease include hypertension, a family history of premature cardiovascular disease, and tobacco use.  Both his mother and brother had heart disease at a young age.  He quit tobacco in October 2021.  He denies any history of diabetes, hypercholesterolemia, or previous  myocardial infarction.  According to the records he does have some mild chronic kidney disease.  His last GFR was 46 in August of this year.   Past Medical History:  Diagnosis Date   Anemia, unspecified    Arthritis    Diverticulosis of colon (without mention of hemorrhage)    Gastric ulcer with hemorrhage and obstruction 2011   Dr [facility]   GERD (gastroesophageal reflux disease)    hx   Hyperlipemia    Hypertension    Personal history of colonic polyps 01/30/2010   hyperplastic rectal   Sleep apnea    on CPAP; [facility]   Stroke [facility])    mini strokes-bilateral carotid endarterectomy    Family History  Problem Relation Age of Onset   Diabetes Father    Hypertension Father    Stroke Father        in late 47s   Heart attack Mother        in 78s   Heart attack Brother 20   Diabetes Paternal Grandfather    Cancer Maternal Grandfather        unknown type   Heart attack Maternal Grandfather 76   Stroke Paternal Uncle        in late 79s   Colon cancer Neg Hx    Esophageal cancer Neg Hx    Stomach cancer Neg Hx    Colon polyps Neg Hx    Rectal cancer Neg  Hx     SOCIAL HISTORY: Social History   Tobacco Use   Smoking status: Former    Packs/day: 0.50    Types: Cigarettes    Quit date: 06/22/2015    Years since quitting: 5.9   Smokeless tobacco: Never   Tobacco comments:    smoked 1976-2007 & 2009- present , up to 2 ppd, average > 1ppd  Substance Use Topics   Alcohol use: Not Currently    Alcohol/week: 0.0 standard drinks    Allergies  Allergen Reactions   Adderall [Amphetamine-Dextroamphet Er] Diarrhea   Prozac [Fluoxetine Hcl] Diarrhea   Sertraline Hcl Diarrhea   Advil [Ibuprofen] Diarrhea    Current Outpatient Medications  Medication Sig Dispense Refill   acetaminophen (TYLENOL) 500 MG tablet Take 500-1,000 mg by mouth every 6 (six) hours as needed for moderate pain or headache.     aspirin EC 81 MG tablet Take 1 tablet (81 mg  total) by mouth daily. Swallow whole. 30 tablet 11   atorvastatin (LIPITOR) 80 MG tablet TAKE 1 TABLET BY MOUTH DAILY 90 tablet 3   Carboxymethylcellul-Glycerin (LUBRICATING EYE DROPS OP) Place 1 drop into both eyes daily as needed (dry eyes).     carvedilol (COREG) 25 MG tablet Take 1 tablet (25 mg total) by mouth 2 (two) times daily. 180 tablet 3   clopidogrel (PLAVIX) 75 MG tablet TAKE 1 TABLET BY MOUTH DAILY WITH BREAKFAST. 90 tablet 3   doxylamine, Sleep, (UNISOM) 25 MG tablet Take 50 mg by mouth at bedtime as needed for sleep.     empagliflozin (JARDIANCE) 10 MG TABS tablet Take 1 tablet (10 mg total) by mouth daily before breakfast. 90 tablet 3   ezetimibe (ZETIA) 10 MG tablet Take 1 tablet (10 mg total) by mouth daily. 90 tablet 3   furosemide (LASIX) 20 MG tablet Take 1 tablet (20 mg total) by mouth daily as needed. For lower extremity swelling 90 tablet 3   Multiple Vitamin (MULTIVITAMIN WITH MINERALS) TABS tablet Take 1 tablet by mouth daily.     predniSONE (DELTASONE) 10 MG tablet 3 tabs by mouth per day for 3 days,2tabs per day for 3 days,1tab per day for 3 days 18 tablet 0   sacubitril-valsartan (ENTRESTO) 97-103 MG Take 1 tablet by mouth 2 (two) times daily. 180 tablet 3   No current facility-administered medications for this visit.    REVIEW OF SYSTEMS:  '[X]'$  denotes positive finding, '[ ]'$  denotes negative finding Cardiac  Comments:  Chest pain or chest pressure:    Shortness of breath upon exertion:    Short of breath when lying flat:    Irregular heart rhythm:        Vascular    Pain in calf, thigh, or hip brought on by ambulation:    Pain in feet at night that wakes you up from your sleep:     Blood clot in your veins:    Leg swelling:         Pulmonary    Oxygen at home:    Productive cough:     Wheezing:         Neurologic    Sudden weakness in arms or legs:     Sudden numbness in arms or legs:     Sudden onset of difficulty speaking or slurred speech:     Temporary loss of vision in one eye:     Problems with dizziness:         Gastrointestinal    Blood  in stool:     Vomited blood:         Genitourinary    Burning when urinating:     Blood in urine:        Psychiatric    Major depression:         Hematologic    Bleeding problems:    Problems with blood clotting too easily:        Skin    Rashes or ulcers:        Constitutional    Fever or chills:    -  PHYSICAL EXAM:   Vitals:   06/01/21 1036 06/01/21 1038  BP: (!) 166/108 (!) 169/113  Pulse: 83   Resp: 20   Temp: 98 F (36.7 C)   SpO2: 98%   Weight: 202 lb (91.6 kg)   Height: '5\' 8"'$  (1.727 m)    Body mass index is 30.71 kg/m. GENERAL: The patient is a well-nourished male, in no acute distress. The vital signs are documented above. CARDIAC: There is a regular rate and rhythm.  VASCULAR: I do not detect carotid bruits. I cannot palpate pedal pulses however both feet are warm and well-perfused. PULMONARY: There is good air exchange bilaterally without wheezing or rales. ABDOMEN: Soft and non-tender with normal pitched bowel sounds.  MUSCULOSKELETAL: There are no major deformities. NEUROLOGIC: No focal weakness or paresthesias are detected. SKIN: There are no ulcers or rashes noted. PSYCHIATRIC: The patient has a normal affect.  DATA:    LABS: I reviewed his labs from August.  His GFR was 46 with a creatinine of 1.67.  CAROTID DUPLEX: I reviewed his carotid duplex scan from 05/05/2021.  He has a known left common and internal carotid artery occlusion.  On the right side he has a significant stenosis in the proximal ICA and distal common carotid artery.  Both vertebral arteries are patent with antegrade flow.  He is undergone previous left subclavian artery angioplasty and stenting and the stent is patent.   [name]

## 2021-06-02 ENCOUNTER — Other Ambulatory Visit: Payer: Self-pay

## 2021-06-02 DIAGNOSIS — I6523 Occlusion and stenosis of bilateral carotid arteries: Secondary | ICD-10-CM

## 2021-06-05 ENCOUNTER — Telehealth: Payer: Self-pay

## 2021-06-05 NOTE — Telephone Encounter (Signed)
-----   Message from [name] sent at 06/02/2021 10:23 AM EDT ----- Regarding: RE: CTA Left msg on vm     ad ----- Message ----- From: Kaleen Mask, LPN Sent: [id number]   3:04 PM EDT To: April H Pait, Jillyn Hidden, # Subject: CTA                                            Please schedule at [facility] prior to appt on 06/15/2021  Thanks.

## 2021-06-07 ENCOUNTER — Telehealth: Payer: Self-pay | Admitting: Interventional Cardiology

## 2021-06-07 NOTE — Telephone Encounter (Signed)
Left message to call office

## 2021-06-07 NOTE — Telephone Encounter (Signed)
I spoke with patient and reviewed upper extremity doppler results with him

## 2021-06-07 NOTE — Telephone Encounter (Signed)
Martin Archer is returning Pat's call from yesterday in regards to his vascular results.

## 2021-06-08 ENCOUNTER — Other Ambulatory Visit: Payer: Self-pay | Admitting: *Deleted

## 2021-06-08 DIAGNOSIS — I6523 Occlusion and stenosis of bilateral carotid arteries: Secondary | ICD-10-CM

## 2021-06-09 ENCOUNTER — Other Ambulatory Visit: Payer: Self-pay

## 2021-06-09 ENCOUNTER — Encounter (HOSPITAL_COMMUNITY): Payer: Self-pay

## 2021-06-09 ENCOUNTER — Ambulatory Visit (HOSPITAL_COMMUNITY)
Admission: RE | Admit: 2021-06-09 | Discharge: 2021-06-09 | Disposition: A | Discharge status: Home / Self Care | Payer: Medicare Other | Source: Ambulatory Visit | Attending: Vascular Surgery | Admitting: Vascular Surgery

## 2021-06-09 DIAGNOSIS — I6523 Occlusion and stenosis of bilateral carotid arteries: Secondary | ICD-10-CM | POA: Insufficient documentation

## 2021-06-09 LAB — POCT I-STAT CREATININE: Creatinine, Ser: 2.5 mg/dL — ABNORMAL HIGH (ref 0.61–1.24)

## 2021-06-09 MED ORDER — IOHEXOL 350 MG/ML SOLN: 100.0000 mL | Freq: Once | INTRAVENOUS | Status: DC | PRN

## 2021-06-09 NOTE — Progress Notes (Signed)
Patient creatine was 2.5, GFR 28 unable to CT angio head and neck because of kidney function.  Dr. Virl Cagey office notified, patient advised to call his office on Monday

## 2021-06-15 ENCOUNTER — Other Ambulatory Visit: Payer: Self-pay

## 2021-06-15 ENCOUNTER — Ambulatory Visit (INDEPENDENT_AMBULATORY_CARE_PROVIDER_SITE_OTHER): Payer: Medicare Other | Admitting: Vascular Surgery

## 2021-06-15 ENCOUNTER — Encounter: Payer: Self-pay | Admitting: Vascular Surgery

## 2021-06-15 VITALS — BP 160/111 | HR 77 | Temp 98.3°F | Resp 20 | Ht 68.0 in | Wt 205.0 lb

## 2021-06-15 DIAGNOSIS — I6523 Occlusion and stenosis of bilateral carotid arteries: Secondary | ICD-10-CM

## 2021-06-15 NOTE — H&P (View-Only) (Signed)
REASON FOR VISIT:   To discuss greater than 80% recurrent right carotid stenosis  MEDICAL ISSUES:   RECURRENT GREATER THAN 80% RIGHT CAROTID STENOSIS: This patient has an asymptomatic recurrent greater than 80% right carotid stenosis.  Given the severity of the stenosis I think this should be addressed in order to lower his risk of future stroke.  We were considering TCAR however his creatinine had jumped significantly and therefore CT angiogram of the neck could not be obtained.  Today I had a lengthy discussion with him about all 3 options.  1 option is to proceed with redo carotid endarterectomy which I think would be perfectly reasonable although it is associated with a slightly increased risk of bleeding or nerve injury.  Perioperative risk of stroke is approximately 1 to 2% with open surgery.  The alternative would be to refer him to nephrology to further assess his renal function and determine if it would be safe to proceed with CT angiogram so that we could consider TCAR.  The third option would be to try to get him brought in for hydration with repeat labs to try again to get the CT angiogram.  After extensive discussion he would like to proceed with open surgery which I think is perfectly reasonable.  We have discussed the indications for the procedure and the potential complications including the risk of perioperative stroke (1 to 2%), nerve injury, or other unpredictable medical problems.  He will make arrangements and call later today to discuss which date works best.  He is on aspirin, Plavix, and a statin.    HPI:   Martin Archer is a pleasant 60 y.o. male who is undergone previous bilateral carotid endarterectomies by Dr. Kellie Simmering.  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  The right carotid endarterectomy was done in 2016.  This was a carotid endarterectomy with patch angioplasty although it does not mention what type of patch was used.  I suspect it was Dacron.  Regardless he  has a recurrent 80% stenosis on the right.  He was being considered for TCAR and I set him up for a CT angiogram however his creatinine was significantly elevated and therefore this was not performed.  He comes in today to discuss these results.  He denies any new neurologic symptoms.  Past Medical History:  Diagnosis Date   Anemia, unspecified    Arthritis    Diverticulosis of colon (without mention of hemorrhage)    Gastric ulcer with hemorrhage and obstruction 2011   Dr [facility]   GERD (gastroesophageal reflux disease)    hx   Hyperlipemia    Hypertension    Personal history of colonic polyps 01/30/2010   hyperplastic rectal   Sleep apnea    on CPAP; [facility]   [facility])    mini strokes-bilateral carotid endarterectomy    Family History  Problem Relation Age of Onset   Diabetes Father    Hypertension Father    Stroke Father        in late 89s   Heart attack Mother        in 61s   Heart attack Brother 28   Diabetes Paternal Grandfather    Cancer Maternal Grandfather        unknown type   Heart attack Maternal Grandfather 76   Stroke Paternal Uncle        in late 5s   Colon cancer Neg Hx    Esophageal cancer Neg Hx  Stomach cancer Neg Hx    Colon polyps Neg Hx    Rectal cancer Neg Hx     SOCIAL HISTORY: Social History   Tobacco Use   Smoking status: Former    Packs/day: 0.50    Types: Cigarettes    Quit date: 06/22/2015    Years since quitting: 5.9   Smokeless tobacco: Never   Tobacco comments:    smoked 1976-2007 & 2009- present , up to 2 ppd, average > 1ppd  Substance Use Topics   Alcohol use: Not Currently    Alcohol/week: 0.0 standard drinks    Allergies  Allergen Reactions   Adderall [Amphetamine-Dextroamphet Er] Diarrhea   Prozac [Fluoxetine Hcl] Diarrhea   Sertraline Hcl Diarrhea   Advil [Ibuprofen] Diarrhea    Current Outpatient Medications  Medication Sig Dispense Refill   acetaminophen (TYLENOL) 500 MG  tablet Take 500-1,000 mg by mouth every 6 (six) hours as needed for moderate pain or headache.     aspirin EC 81 MG tablet Take 1 tablet (81 mg total) by mouth daily. Swallow whole. 30 tablet 11   atorvastatin (LIPITOR) 80 MG tablet TAKE 1 TABLET BY MOUTH DAILY 90 tablet 3   Carboxymethylcellul-Glycerin (LUBRICATING EYE DROPS OP) Place 1 drop into both eyes daily as needed (dry eyes).     carvedilol (COREG) 25 MG tablet Take 1 tablet (25 mg total) by mouth 2 (two) times daily. 180 tablet 3   clopidogrel (PLAVIX) 75 MG tablet TAKE 1 TABLET BY MOUTH DAILY WITH BREAKFAST. 90 tablet 3   doxylamine, Sleep, (UNISOM) 25 MG tablet Take 50 mg by mouth at bedtime as needed for sleep.     empagliflozin (JARDIANCE) 10 MG TABS tablet Take 1 tablet (10 mg total) by mouth daily before breakfast. 90 tablet 3   ezetimibe (ZETIA) 10 MG tablet Take 1 tablet (10 mg total) by mouth daily. 90 tablet 3   furosemide (LASIX) 20 MG tablet Take 1 tablet (20 mg total) by mouth daily as needed. For lower extremity swelling 90 tablet 3   Multiple Vitamin (MULTIVITAMIN WITH MINERALS) TABS tablet Take 1 tablet by mouth daily.     predniSONE (DELTASONE) 10 MG tablet 3 tabs by mouth per day for 3 days,2tabs per day for 3 days,1tab per day for 3 days 18 tablet 0   sacubitril-valsartan (ENTRESTO) 97-103 MG Take 1 tablet by mouth 2 (two) times daily. 180 tablet 3   No current facility-administered medications for this visit.    REVIEW OF SYSTEMS:  '[X]'$  denotes positive finding, '[ ]'$  denotes negative finding Cardiac  Comments:  Chest pain or chest pressure:    Shortness of breath upon exertion:    Short of breath when lying flat:    Irregular heart rhythm:        Vascular    Pain in calf, thigh, or hip brought on by ambulation:    Pain in feet at night that wakes you up from your sleep:     Blood clot in your veins:    Leg swelling:         Pulmonary    Oxygen at home:    Productive cough:     Wheezing:          Neurologic    Sudden weakness in arms or legs:     Sudden numbness in arms or legs:     Sudden onset of difficulty speaking or slurred speech:    Temporary loss of vision in one eye:  Problems with dizziness:         Gastrointestinal    Blood in stool:     Vomited blood:         Genitourinary    Burning when urinating:     Blood in urine:        Psychiatric    Major depression:         Hematologic    Bleeding problems:    Problems with blood clotting too easily:        Skin    Rashes or ulcers:        Constitutional    Fever or chills:     PHYSICAL EXAM:   Vitals:   06/15/21 0835 06/15/21 0837  BP: (!) 160/104 (!) 160/111  Pulse: 77   Resp: 20   Temp: 98.3 F (36.8 C)   SpO2: 97%   Weight: 205 lb (93 kg)   Height: '5\' 8"'$  (1.727 m)     GENERAL: The patient is a well-nourished male, in no acute distress. The vital signs are documented above. CARDIAC: There is a regular rate and rhythm.  VASCULAR: He has bilateral carotid bruits. PULMONARY: There is good air exchange bilaterally without wheezing or rales. MUSCULOSKELETAL: There are no major deformities or cyanosis. NEUROLOGIC: No focal weakness or paresthesias are detected. SKIN: There are no ulcers or rashes noted. PSYCHIATRIC: The patient has a normal affect.  DATA:    His creatinine was 2.5 prior to his CT which was canceled.  In August it was 1.67.  GFR in August was 46.  [name] [facility] [phone]

## 2021-06-15 NOTE — Progress Notes (Signed)
REASON FOR VISIT:   To discuss greater than 80% recurrent right carotid stenosis  MEDICAL ISSUES:   RECURRENT GREATER THAN 80% RIGHT CAROTID STENOSIS: This patient has an asymptomatic recurrent greater than 80% right carotid stenosis.  Given the severity of the stenosis I think this should be addressed in order to lower his risk of future stroke.  We were considering TCAR however his creatinine had jumped significantly and therefore CT angiogram of the neck could not be obtained.  Today I had a lengthy discussion with him about all 3 options.  1 option is to proceed with redo carotid endarterectomy which I think would be perfectly reasonable although it is associated with a slightly increased risk of bleeding or nerve injury.  Perioperative risk of stroke is approximately 1 to 2% with open surgery.  The alternative would be to refer him to nephrology to further assess his renal function and determine if it would be safe to proceed with CT angiogram so that we could consider TCAR.  The third option would be to try to get him brought in for hydration with repeat labs to try again to get the CT angiogram.  After extensive discussion he would like to proceed with open surgery which I think is perfectly reasonable.  We have discussed the indications for the procedure and the potential complications including the risk of perioperative stroke (1 to 2%), nerve injury, or other unpredictable medical problems.  He will make arrangements and call later today to discuss which date works best.  He is on aspirin, Plavix, and a statin.    HPI:   Martin Archer is a pleasant 60 y.o. male who is undergone previous bilateral carotid endarterectomies by Dr. Kellie Simmering.  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  The right carotid endarterectomy was done in 2016.  This was a carotid endarterectomy with patch angioplasty although it does not mention what type of patch was used.  I suspect it was Dacron.  Regardless he  has a recurrent 80% stenosis on the right.  He was being considered for TCAR and I set him up for a CT angiogram however his creatinine was significantly elevated and therefore this was not performed.  He comes in today to discuss these results.  He denies any new neurologic symptoms.  Past Medical History:  Diagnosis Date   Anemia, unspecified    Arthritis    Diverticulosis of colon (without mention of hemorrhage)    Gastric ulcer with hemorrhage and obstruction 2011   Dr [facility]   GERD (gastroesophageal reflux disease)    hx   Hyperlipemia    Hypertension    Personal history of colonic polyps 01/30/2010   hyperplastic rectal   Sleep apnea    on CPAP; [facility]   Stroke [facility])    mini strokes-bilateral carotid endarterectomy    Family History  Problem Relation Age of Onset   Diabetes Father    Hypertension Father    Stroke Father        in late 64s   Heart attack Mother        in 76s   Heart attack Brother 71   Diabetes Paternal Grandfather    Cancer Maternal Grandfather        unknown type   Heart attack Maternal Grandfather 76   Stroke Paternal Uncle        in late 9s   Colon cancer Neg Hx    Esophageal cancer Neg Hx  Stomach cancer Neg Hx    Colon polyps Neg Hx    Rectal cancer Neg Hx     SOCIAL HISTORY: Social History   Tobacco Use   Smoking status: Former    Packs/day: 0.50    Types: Cigarettes    Quit date: 06/22/2015    Years since quitting: 5.9   Smokeless tobacco: Never   Tobacco comments:    smoked 1976-2007 & 2009- present , up to 2 ppd, average > 1ppd  Substance Use Topics   Alcohol use: Not Currently    Alcohol/week: 0.0 standard drinks    Allergies  Allergen Reactions   Adderall [Amphetamine-Dextroamphet Er] Diarrhea   Prozac [Fluoxetine Hcl] Diarrhea   Sertraline Hcl Diarrhea   Advil [Ibuprofen] Diarrhea    Current Outpatient Medications  Medication Sig Dispense Refill   acetaminophen (TYLENOL) 500 MG  tablet Take 500-1,000 mg by mouth every 6 (six) hours as needed for moderate pain or headache.     aspirin EC 81 MG tablet Take 1 tablet (81 mg total) by mouth daily. Swallow whole. 30 tablet 11   atorvastatin (LIPITOR) 80 MG tablet TAKE 1 TABLET BY MOUTH DAILY 90 tablet 3   Carboxymethylcellul-Glycerin (LUBRICATING EYE DROPS OP) Place 1 drop into both eyes daily as needed (dry eyes).     carvedilol (COREG) 25 MG tablet Take 1 tablet (25 mg total) by mouth 2 (two) times daily. 180 tablet 3   clopidogrel (PLAVIX) 75 MG tablet TAKE 1 TABLET BY MOUTH DAILY WITH BREAKFAST. 90 tablet 3   doxylamine, Sleep, (UNISOM) 25 MG tablet Take 50 mg by mouth at bedtime as needed for sleep.     empagliflozin (JARDIANCE) 10 MG TABS tablet Take 1 tablet (10 mg total) by mouth daily before breakfast. 90 tablet 3   ezetimibe (ZETIA) 10 MG tablet Take 1 tablet (10 mg total) by mouth daily. 90 tablet 3   furosemide (LASIX) 20 MG tablet Take 1 tablet (20 mg total) by mouth daily as needed. For lower extremity swelling 90 tablet 3   Multiple Vitamin (MULTIVITAMIN WITH MINERALS) TABS tablet Take 1 tablet by mouth daily.     predniSONE (DELTASONE) 10 MG tablet 3 tabs by mouth per day for 3 days,2tabs per day for 3 days,1tab per day for 3 days 18 tablet 0   sacubitril-valsartan (ENTRESTO) 97-103 MG Take 1 tablet by mouth 2 (two) times daily. 180 tablet 3   No current facility-administered medications for this visit.    REVIEW OF SYSTEMS:  '[X]'$  denotes positive finding, '[ ]'$  denotes negative finding Cardiac  Comments:  Chest pain or chest pressure:    Shortness of breath upon exertion:    Short of breath when lying flat:    Irregular heart rhythm:        Vascular    Pain in calf, thigh, or hip brought on by ambulation:    Pain in feet at night that wakes you up from your sleep:     Blood clot in your veins:    Leg swelling:         Pulmonary    Oxygen at home:    Productive cough:     Wheezing:          Neurologic    Sudden weakness in arms or legs:     Sudden numbness in arms or legs:     Sudden onset of difficulty speaking or slurred speech:    Temporary loss of vision in one eye:  Problems with dizziness:         Gastrointestinal    Blood in stool:     Vomited blood:         Genitourinary    Burning when urinating:     Blood in urine:        Psychiatric    Major depression:         Hematologic    Bleeding problems:    Problems with blood clotting too easily:        Skin    Rashes or ulcers:        Constitutional    Fever or chills:     PHYSICAL EXAM:   Vitals:   06/15/21 0835 06/15/21 0837  BP: (!) 160/104 (!) 160/111  Pulse: 77   Resp: 20   Temp: 98.3 F (36.8 C)   SpO2: 97%   Weight: 205 lb (93 kg)   Height: '5\' 8"'$  (1.727 m)     GENERAL: The patient is a well-nourished male, in no acute distress. The vital signs are documented above. CARDIAC: There is a regular rate and rhythm.  VASCULAR: He has bilateral carotid bruits. PULMONARY: There is good air exchange bilaterally without wheezing or rales. MUSCULOSKELETAL: There are no major deformities or cyanosis. NEUROLOGIC: No focal weakness or paresthesias are detected. SKIN: There are no ulcers or rashes noted. PSYCHIATRIC: The patient has a normal affect.  DATA:    His creatinine was 2.5 prior to his CT which was canceled.  In August it was 1.67.  GFR in August was 46.  [name] [facility] [phone]

## 2021-06-22 NOTE — Pre-Procedure Instructions (Signed)
Surgical Instructions    Your procedure is scheduled on Tuesday, November 11th.  Report to [facility] Main Entrance "A" at 7:30 A.M., then check in with the Admitting office.  Call this number if you have problems the morning of surgery:  [phone]   If you have any questions prior to your surgery date call [phone]: Open Monday-Friday 8am-4pm    Remember:  Do not eat or drink after midnight the night before your surgery     Take these medicines the morning of surgery with A SIP OF WATER  Aspirin atorvastatin (LIPITOR) carvedilol (COREG) ezetimibe (ZETIA)  acetaminophen (TYLENOL)-as needed Eye drops- as needed  Follow your surgeon's instructions on when to stop clopidogrel (PLAVIX).  If no instructions were given by your surgeon then you will need to call the office to get those instructions.    As of today, STOP taking any Aleve, Naproxen, Ibuprofen, Motrin, Advil, Goody's, BC's, all herbal medications, fish oil, and all vitamins.  WHAT DO I DO ABOUT MY DIABETES MEDICATION?   Do not take empagliflozin (JARDIANCE) on Monday (10/31) or the morning of surgery (11/1).   HOW TO MANAGE YOUR DIABETES BEFORE AND AFTER SURGERY  Why is it important to control my blood sugar before and after surgery? Improving blood sugar levels before and after surgery helps healing and can limit problems. A way of improving blood sugar control is eating a healthy diet by:  Eating less sugar and carbohydrates  Increasing activity/exercise  Talking with your doctor about reaching your blood sugar goals High blood sugars (greater than 180 mg/dL) can raise your risk of infections and slow your recovery, so you will need to focus on controlling your diabetes during the weeks before surgery. Make sure that the doctor who takes care of your diabetes knows about your planned surgery including the date and location.  How do I manage my blood sugar before surgery? Check your blood sugar at least 4  times a day, starting 2 days before surgery, to make sure that the level is not too high or low.  Check your blood sugar the morning of your surgery when you wake up and every 2 hours until you get to the Short Stay unit.  If your blood sugar is less than 70 mg/dL, you will need to treat for low blood sugar: Do not take insulin. Treat a low blood sugar (less than 70 mg/dL) with  cup of clear juice (cranberry or apple), 4 glucose tablets, OR glucose gel. Recheck blood sugar in 15 minutes after treatment (to make sure it is greater than 70 mg/dL). If your blood sugar is not greater than 70 mg/dL on recheck, call [phone] for further instructions. Report your blood sugar to the short stay nurse when you get to Short Stay.  If you are admitted to the hospital after surgery: Your blood sugar will be checked by the staff and you will probably be given insulin after surgery (instead of oral diabetes medicines) to make sure you have good blood sugar levels. The goal for blood sugar control after surgery is 80-180 mg/dL.                      Do NOT Smoke (Tobacco/Vaping) or drink Alcohol 24 hours prior to your procedure.  If you use a CPAP at night, you may bring all equipment for your overnight stay.   Contacts, glasses, piercing's, hearing aid's, dentures or partials may not be worn into surgery, please bring cases  for these belongings.    For patients admitted to the hospital, discharge time will be determined by your treatment team.   Patients discharged the day of surgery will not be allowed to drive home, and someone needs to stay with them for 24 hours.  NO VISITORS WILL BE ALLOWED IN PRE-OP WHERE PATIENTS GET READY FOR SURGERY.  ONLY 1 SUPPORT PERSON MAY BE PRESENT IN THE WAITING ROOM WHILE YOU ARE IN SURGERY.  IF YOU ARE TO BE ADMITTED, ONCE YOU ARE IN YOUR ROOM YOU WILL BE ALLOWED TWO (2) VISITORS.  Minor children may have two parents present. Special consideration for safety and  communication needs will be reviewed on a case by case basis.   Special instructions:   [facility]- Preparing For Surgery  Before surgery, you can play an important role. Because skin is not sterile, your skin needs to be as free of germs as possible. You can reduce the number of germs on your skin by washing with CHG (chlorahexidine gluconate) Soap before surgery.  CHG is an antiseptic cleaner which kills germs and bonds with the skin to continue killing germs even after washing.    Oral Hygiene is also important to reduce your risk of infection.  Remember - BRUSH YOUR TEETH THE MORNING OF SURGERY WITH YOUR REGULAR TOOTHPASTE  Please do not use if you have an allergy to CHG or antibacterial soaps. If your skin becomes reddened/irritated stop using the CHG.  Do not shave (including legs and underarms) for at least 48 hours prior to first CHG shower. It is OK to shave your face.  Please follow these instructions carefully.   Shower the NIGHT BEFORE SURGERY and the MORNING OF SURGERY  If you chose to wash your hair, wash your hair first as usual with your normal shampoo.  After you shampoo, rinse your hair and body thoroughly to remove the shampoo.  Use CHG Soap as you would any other liquid soap. You can apply CHG directly to the skin and wash gently with a scrungie or a clean washcloth.   Apply the CHG Soap to your body ONLY FROM THE NECK DOWN.  Do not use on open wounds or open sores. Avoid contact with your eyes, ears, mouth and genitals (private parts). Wash Face and genitals (private parts)  with your normal soap.   Wash thoroughly, paying special attention to the area where your surgery will be performed.  Thoroughly rinse your body with warm water from the neck down.  DO NOT shower/wash with your normal soap after using and rinsing off the CHG Soap.  Pat yourself dry with a CLEAN TOWEL.  Wear CLEAN PAJAMAS to bed the night before surgery  Place CLEAN SHEETS on your bed the  night before your surgery  DO NOT SLEEP WITH PETS.   Day of Surgery: Shower with CHG soap. Do not wear jewelry Do not wear lotions, powders, colognes, or deodorant. Do not shave 48 hours prior to surgery.  Men may shave face and neck. Do not bring valuables to the hospital. [facility] is not responsible for any belongings or valuables. Wear Clean/Comfortable clothing the morning of surgery Remember to brush your teeth WITH YOUR REGULAR TOOTHPASTE.   Please read over the following fact sheets that you were given.   3 days prior to your procedure or After your COVID test   You are not required to quarantine however you are required to wear a well-fitting mask when you are out and around people  not in your household. If your mask becomes wet or soiled, replace with a new one.   Wash your hands often with soap and water for 20 seconds or clean your hands with an alcohol-based hand sanitizer that contains at least 60% alcohol.   Do not share personal items.   Notify your provider:  o if you are in close contact with someone who has COVID  o or if you develop a fever of 100.4 or greater, sneezing, cough, sore throat, shortness of breath or body aches.

## 2021-06-23 ENCOUNTER — Encounter (HOSPITAL_COMMUNITY): Payer: Self-pay

## 2021-06-23 ENCOUNTER — Encounter (HOSPITAL_COMMUNITY)
Admission: RE | Admit: 2021-06-23 | Discharge: 2021-06-23 | Disposition: A | Discharge status: Home / Self Care | Payer: Medicare Other | Source: Ambulatory Visit | Attending: Vascular Surgery | Admitting: Vascular Surgery

## 2021-06-23 ENCOUNTER — Other Ambulatory Visit: Payer: Self-pay

## 2021-06-23 VITALS — BP 166/109 | HR 80 | Temp 97.6°F | Resp 19 | Ht 68.0 in | Wt 205.5 lb

## 2021-06-23 DIAGNOSIS — I6523 Occlusion and stenosis of bilateral carotid arteries: Secondary | ICD-10-CM | POA: Diagnosis not present

## 2021-06-23 DIAGNOSIS — Z01818 Encounter for other preprocedural examination: Secondary | ICD-10-CM

## 2021-06-23 DIAGNOSIS — Z20822 Contact with and (suspected) exposure to covid-19: Secondary | ICD-10-CM | POA: Insufficient documentation

## 2021-06-23 DIAGNOSIS — Z01812 Encounter for preprocedural laboratory examination: Secondary | ICD-10-CM | POA: Insufficient documentation

## 2021-06-23 HISTORY — DX: Other psychoactive substance abuse, uncomplicated: F19.10

## 2021-06-23 LAB — COMPREHENSIVE METABOLIC PANEL
ALT: 24 U/L (ref 0–44)
AST: 23 U/L (ref 15–41)
Albumin: 3.7 g/dL (ref 3.5–5.0)
Alkaline Phosphatase: 102 U/L (ref 38–126)
Anion gap: 9 (ref 5–15)
BUN: 45 mg/dL — ABNORMAL HIGH (ref 8–23)
CO2: 21 mmol/L — ABNORMAL LOW (ref 22–32)
Calcium: 8.9 mg/dL (ref 8.9–10.3)
Chloride: 104 mmol/L (ref 98–111)
Creatinine, Ser: 2.55 mg/dL — ABNORMAL HIGH (ref 0.61–1.24)
GFR, Estimated: 28 mL/min — ABNORMAL LOW (ref >60)
Glucose, Bld: 90 mg/dL (ref 70–99)
Potassium: 4.8 mmol/L (ref 3.5–5.1)
Sodium: 134 mmol/L — ABNORMAL LOW (ref 135–145)
Total Bilirubin: 0.7 mg/dL (ref 0.3–1.2)
Total Protein: 6.9 g/dL (ref 6.5–8.1)

## 2021-06-23 LAB — CBC
HCT: 43.8 % (ref 39.0–52.0)
Hemoglobin: 14.6 g/dL (ref 13.0–17.0)
MCH: 31 pg (ref 26.0–34.0)
MCHC: 33.3 g/dL (ref 30.0–36.0)
MCV: 93 fL (ref 80.0–100.0)
Platelets: 238 10*3/uL (ref 150–400)
RBC: 4.71 MIL/uL (ref 4.22–5.81)
RDW: 13.5 % (ref 11.5–15.5)
WBC: 11.5 10*3/uL — ABNORMAL HIGH (ref 4.0–10.5)
nRBC: 0 % (ref 0.0–0.2)

## 2021-06-23 LAB — BLOOD GAS, ARTERIAL
Acid-base deficit: 1.9 mmol/L (ref 0.0–2.0)
Bicarbonate: 22.3 mmol/L (ref 20.0–28.0)
Drawn by: 602861
FIO2: 21
O2 Saturation: 97.2 %
Patient temperature: 37
pCO2 arterial: 37.9 mmHg (ref 32.0–48.0)
pH, Arterial: 7.388 (ref 7.350–7.450)
pO2, Arterial: 90.8 mmHg (ref 83.0–108.0)

## 2021-06-23 LAB — TYPE AND SCREEN
ABO/RH(D): O POS
Antibody Screen: NEGATIVE

## 2021-06-23 LAB — URINALYSIS, ROUTINE W REFLEX MICROSCOPIC
Bilirubin Urine: NEGATIVE
Glucose, UA: 150 mg/dL — AB
Hgb urine dipstick: NEGATIVE
Ketones, ur: NEGATIVE mg/dL
Leukocytes,Ua: NEGATIVE
Nitrite: NEGATIVE
Protein, ur: NEGATIVE mg/dL
Specific Gravity, Urine: 1.005 (ref 1.005–1.030)
pH: 5 (ref 5.0–8.0)

## 2021-06-23 LAB — PROTIME-INR
INR: 1 (ref 0.8–1.2)
Prothrombin Time: 12.8 seconds (ref 11.4–15.2)

## 2021-06-23 LAB — APTT: aPTT: 29 seconds (ref 24–36)

## 2021-06-23 LAB — SURGICAL PCR SCREEN
MRSA, PCR: NEGATIVE
Staphylococcus aureus: NEGATIVE

## 2021-06-23 LAB — SARS CORONAVIRUS 2 (TAT 6-24 HRS): SARS Coronavirus 2: NEGATIVE

## 2021-06-23 NOTE — Progress Notes (Signed)
PCP - Dr. [name] Cardiologist - Dr. Larae Grooms  Chest x-ray - 12/19/20 EKG - 05/09/21 Stress Test - Years ago not recently ECHO - 12/20/20 Cardiac Cath - 01/25/21  Sleep Study - Yes has OSA CPAP -  Not working right now  DM - Denies  Blood Thinner Instructions: Will continue Aspirin Instructions: Will continue   COVID TEST- 06/23/21   Anesthesia review: yes cardiac history  Patient denies shortness of breath, fever, cough and chest pain at PAT appointment   All instructions explained to the patient, with a verbal understanding of the material. Patient agrees to go over the instructions while at home for a better understanding. Patient also instructed to wear a mask in public after being tested for COVID-19. The opportunity to ask questions was provided.

## 2021-06-26 NOTE — Progress Notes (Signed)
Anesthesia Chart Review:  Follows with vascular surgery for carotid disease. S/p bilateral carotid endarterectomies, right CEA was in 2016. Left carotid now known to be chronically occluded. Right carotid now >80% asymptomatic stenosis and re-intervention recommended.   Follows with cardiology for history of CAD with chronically occluded proximal RCA with left-to-right collaterals, severe subclavian artery stenosis, and mildly depressed LVEF of 40 to 45% on echo 11/2020 with grade 3 diastolic dysfunction and elevated LVEDP.  LV dysfunction felt to be out of proportion to degree of CAD and he has been started on GDMT for heart failure.  Current regimen includes Entresto, carvedilol, Jardiance.   Recent PV angiogram by Dr. Fletcher Anon for severe left subclavian artery stenosis on 04/26/2021.  This was stented successfully.  He was placed on DAPT.  Per vascular surgery, patient to continue his aspirin and Plavix through surgery.  History of OSA, patient reports not currently using CPAP as machine is not working.  Hx of CKD, however creatinine has been more elevated recently. Baseline around 1.7, however recently 2.50 on 06/09/21 and 2.55 on preop labs 06/23/21. Dr. Scot Dock aware. Remainder of preop labs unremarkable.   EKG 05/09/2021: NSR.  Possible LAE.  T wave abnormality, consider lateral ischemia.  Prolonged QT (QTC 474).  Carotid Duplex 05/05/21: Summary:  Right Carotid: Velocities in the right ICA have increased compared to previous exam and are now consistent with an 80-99% stenosis. Hemodynamically significant plaque >50% visualized in the distal CCA.  Left Carotid: Known left CCA to ICA occlusion, s/p endarterectomy.  Vertebrals:  Bilateral vertebral arteries demonstrate antegrade flow.  Subclavians: Normal flow hemodynamics were seen in bilateral subclavian arteries. The left subclavian artery is widely patent, s/p stenting. Bilateral brachial arteries demonstrate brisk, triphasic flow.   Right/Left  cath 01/25/21: Prox RCA lesion is 100% stenosed. Left to right collaterals. 1st Diag lesion is 25% stenosed. 3rd Mrg lesion is 70% stenosed. LV end diastolic pressure is normal. There is no aortic valve stenosis. Hemodynamic findings consistent with mild pulmonary hypertension. Ao sat 94%, PA sat 67%, mean PA pressure 27 mm Hg; mean PCWP 13 mm Hg; CO 4.6 L/min; CI 2.26   LV dysfuction out of proportion to the degree of CAD.   Medical therapy for heart failure.   TTE 12/20/20:  1. Left ventricular ejection fraction, by estimation, is 40 to 45%. The  left ventricle has mildly decreased function. The left ventricle  demonstrates global hypokinesis. The left ventricular internal cavity size  was moderately dilated. Left ventricular  diastolic parameters are consistent with Grade III diastolic dysfunction  (restrictive). Elevated left ventricular end-diastolic pressure.   2. Right ventricular systolic function is normal. The right ventricular  size is normal. There is mildly elevated pulmonary artery systolic  pressure. The estimated right ventricular systolic pressure is 85.4 mmHg.   3. Left atrial size was severely dilated.   4. Right atrial size was severely dilated.   5. The mitral valve is normal in structure. Mild mitral valve  regurgitation. No evidence of mitral stenosis.   6. The aortic valve has an indeterminant number of cusps. Aortic valve  regurgitation is not visualized. Mild to moderate aortic valve  sclerosis/calcification is present, without any evidence of aortic  stenosis.   7. Aortic dilatation noted. There is mild dilatation of the aortic root,  measuring 38 mm. There is mild dilatation of the ascending aorta,  measuring 43 mm.   8. The inferior vena cava is normal in size with greater than 50%  respiratory  variability, suggesting right atrial pressure of 3 mmHg.   9. Compared to prior echo, LVF has declined.     Martin Archer [facility] Short Stay  Center/Anesthesiology Phone [phone] 06/26/2021 10:36 AM

## 2021-06-26 NOTE — Anesthesia Preprocedure Evaluation (Addendum)
Anesthesia Evaluation  Patient identified by MRN, date of birth, ID band Patient awake    Reviewed: Allergy & Precautions, NPO status , Patient's Chart, lab work & pertinent test results, reviewed documented beta blocker date and time   Airway Mallampati: II  TM Distance: >3 FB Neck ROM: Full    Dental  (+) Teeth Intact, Dental Advisory Given   Pulmonary sleep apnea and Continuous Positive Airway Pressure Ventilation , former smoker,    Pulmonary exam normal breath sounds clear to auscultation       Cardiovascular hypertension, Pt. on home beta blockers and Pt. on medications + Peripheral Vascular Disease  Normal cardiovascular exam Rhythm:Regular Rate:Normal  S/p bilateral carotid endarterectomies, right CEA was in 2016. Left carotid now known to be chronically occluded. Right carotid now >80% asymptomatic stenosis   Neuro/Psych CVA    GI/Hepatic PUD, GERD  ,(+)     substance abuse  alcohol use,   Endo/Other  diabetes, Type 2, Oral Hypoglycemic AgentsObesity   Renal/GU Renal InsufficiencyRenal disease     Musculoskeletal  (+) Arthritis ,   Abdominal   Peds  (+) ATTENTION DEFICIT DISORDER WITHOUT HYPERACTIVITY Hematology negative hematology ROS (+)   Anesthesia Other Findings   Reproductive/Obstetrics                           Anesthesia Physical Anesthesia Plan  ASA: 4  Anesthesia Plan: General   Post-op Pain Management:    Induction: Intravenous  PONV Risk Score and Plan: 2 and Midazolam, Dexamethasone and Ondansetron  Airway Management Planned: Oral ETT  Additional Equipment: Arterial line  Intra-op Plan:   Post-operative Plan: Extubation in OR  Informed Consent: I have reviewed the patients History and Physical, chart, labs and discussed the procedure including the risks, benefits and alternatives for the proposed anesthesia with the patient or authorized representative  who has indicated his/her understanding and acceptance.     Dental advisory given  Plan Discussed with: CRNA  Anesthesia Plan Comments: (PAT note by Karoline Caldwell, PA-C: Follows with vascular surgery for carotid disease. S/p bilateral carotid endarterectomies, right CEA was in 2016. Left carotid now known to be chronically occluded. Right carotid now >80% asymptomatic stenosis and re-intervention recommended.   Follows with cardiology for history of CAD with chronically occluded proximal RCA with left-to-right collaterals, severe subclavian artery stenosis, and mildly depressed LVEF of 40 to 45% on echo 11/2020 with grade 3 diastolic dysfunction and elevated LVEDP.  LV dysfunction felt to be out of proportion to degree of CAD and he has been started on GDMT for heart failure.  Current regimen includes Entresto, carvedilol, Jardiance.   Recent PV angiogram by Dr. Fletcher Anon for severe left subclavian artery stenosis on 04/26/2021.  This was stented successfully.  He was placed on DAPT.  Per vascular surgery, patient to continue his aspirin and Plavix through surgery.  History of OSA, patient reports not currently using CPAP as machine is not working.  Hx of CKD, however creatinine has been more elevated recently. Baseline around 1.7, however recently 2.50 on 06/09/21 and 2.55 on preop labs 06/23/21. Dr. Scot Dock aware. Remainder of preop labs unremarkable.   EKG 05/09/2021: NSR.  Possible LAE.  T wave abnormality, consider lateral ischemia.  Prolonged QT (QTC 474).  Carotid Duplex 05/05/21: Summary:  Right Carotid: Velocities in the right ICA have increased compared to previous exam and are now consistent with an 80-99% stenosis. Hemodynamically significant plaque >50% visualized in the distal  CCA.  Left Carotid: Known left CCA to ICA occlusion, s/p endarterectomy.  Vertebrals: Bilateral vertebral arteries demonstrate antegrade flow.  Subclavians: Normal flow hemodynamics were seen in bilateral  subclavian arteries. The left subclavian artery is widely patent, s/p stenting. Bilateral brachial arteries demonstrate brisk, triphasic flow.   Right/Left cath 01/25/21: . Prox RCA lesion is 100% stenosed. Left to right collaterals. . 1st Diag lesion is 25% stenosed. . 3rd Mrg lesion is 70% stenosed. . LV end diastolic pressure is normal. . There is no aortic valve stenosis. . Hemodynamic findings consistent with mild pulmonary hypertension. . Ao sat 94%, PA sat 67%, mean PA pressure 27 mm Hg; mean PCWP 13 mm Hg; CO 4.6 L/min; CI 2.26  LV dysfuction out of proportion to the degree of CAD.  Medical therapy for heart failure.   TTE 12/20/20: 1. Left ventricular ejection fraction, by estimation, is 40 to 45%. The  left ventricle has mildly decreased function. The left ventricle  demonstrates global hypokinesis. The left ventricular internal cavity size  was moderately dilated. Left ventricular  diastolic parameters are consistent with Grade III diastolic dysfunction  (restrictive). Elevated left ventricular end-diastolic pressure.  2. Right ventricular systolic function is normal. The right ventricular  size is normal. There is mildly elevated pulmonary artery systolic  pressure. The estimated right ventricular systolic pressure is 67.2 mmHg.  3. Left atrial size was severely dilated.  4. Right atrial size was severely dilated.  5. The mitral valve is normal in structure. Mild mitral valve  regurgitation. No evidence of mitral stenosis.  6. The aortic valve has an indeterminant number of cusps. Aortic valve  regurgitation is not visualized. Mild to moderate aortic valve  sclerosis/calcification is present, without any evidence of aortic  stenosis.  7. Aortic dilatation noted. There is mild dilatation of the aortic root,  measuring 38 mm. There is mild dilatation of the ascending aorta,  measuring 43 mm.  8. The inferior vena cava is normal in size with greater than 50%   respiratory variability, suggesting right atrial pressure of 3 mmHg.  9. Compared to prior echo, LVF has declined.   )      Anesthesia Quick Evaluation

## 2021-06-27 ENCOUNTER — Inpatient Hospital Stay (HOSPITAL_COMMUNITY): Payer: Medicare Other | Admitting: Physician Assistant

## 2021-06-27 ENCOUNTER — Encounter (HOSPITAL_COMMUNITY): Payer: Self-pay | Admitting: Vascular Surgery

## 2021-06-27 ENCOUNTER — Inpatient Hospital Stay (HOSPITAL_COMMUNITY): Payer: Medicare Other | Admitting: Anesthesiology

## 2021-06-27 ENCOUNTER — Inpatient Hospital Stay (HOSPITAL_COMMUNITY)
Admission: RE | Admit: 2021-06-27 | Discharge: 2021-06-28 | DRG: 039 | Disposition: A | Discharge status: Home / Self Care | Payer: Medicare Other | Attending: Vascular Surgery | Admitting: Vascular Surgery

## 2021-06-27 ENCOUNTER — Encounter (HOSPITAL_COMMUNITY)
Admission: RE | Disposition: A | Discharge status: Home / Self Care | Payer: Self-pay | Source: Home / Self Care | Attending: Vascular Surgery

## 2021-06-27 DIAGNOSIS — Z79899 Other long term (current) drug therapy: Secondary | ICD-10-CM | POA: Diagnosis not present

## 2021-06-27 DIAGNOSIS — Z888 Allergy status to other drugs, medicaments and biological substances status: Secondary | ICD-10-CM | POA: Diagnosis not present

## 2021-06-27 DIAGNOSIS — Z7984 Long term (current) use of oral hypoglycemic drugs: Secondary | ICD-10-CM

## 2021-06-27 DIAGNOSIS — I6521 Occlusion and stenosis of right carotid artery: Principal | ICD-10-CM | POA: Diagnosis present

## 2021-06-27 DIAGNOSIS — Z823 Family history of stroke: Secondary | ICD-10-CM

## 2021-06-27 DIAGNOSIS — K219 Gastro-esophageal reflux disease without esophagitis: Secondary | ICD-10-CM | POA: Diagnosis present

## 2021-06-27 DIAGNOSIS — Z7902 Long term (current) use of antithrombotics/antiplatelets: Secondary | ICD-10-CM

## 2021-06-27 DIAGNOSIS — G4730 Sleep apnea, unspecified: Secondary | ICD-10-CM | POA: Diagnosis present

## 2021-06-27 DIAGNOSIS — E785 Hyperlipidemia, unspecified: Secondary | ICD-10-CM | POA: Diagnosis present

## 2021-06-27 DIAGNOSIS — Z87891 Personal history of nicotine dependence: Secondary | ICD-10-CM | POA: Diagnosis not present

## 2021-06-27 DIAGNOSIS — Z886 Allergy status to analgesic agent status: Secondary | ICD-10-CM

## 2021-06-27 DIAGNOSIS — Z8249 Family history of ischemic heart disease and other diseases of the circulatory system: Secondary | ICD-10-CM | POA: Diagnosis not present

## 2021-06-27 DIAGNOSIS — I6529 Occlusion and stenosis of unspecified carotid artery: Secondary | ICD-10-CM | POA: Diagnosis present

## 2021-06-27 DIAGNOSIS — Z7982 Long term (current) use of aspirin: Secondary | ICD-10-CM

## 2021-06-27 DIAGNOSIS — E119 Type 2 diabetes mellitus without complications: Secondary | ICD-10-CM | POA: Diagnosis present

## 2021-06-27 DIAGNOSIS — I10 Essential (primary) hypertension: Secondary | ICD-10-CM | POA: Diagnosis present

## 2021-06-27 DIAGNOSIS — Z8673 Personal history of transient ischemic attack (TIA), and cerebral infarction without residual deficits: Secondary | ICD-10-CM | POA: Diagnosis not present

## 2021-06-27 HISTORY — PX: ENDARTERECTOMY: SHX5162

## 2021-06-27 HISTORY — PX: PATCH ANGIOPLASTY: SHX6230

## 2021-06-27 LAB — CBC
HCT: 37.7 % — ABNORMAL LOW (ref 39.0–52.0)
Hemoglobin: 12.8 g/dL — ABNORMAL LOW (ref 13.0–17.0)
MCH: 31.1 pg (ref 26.0–34.0)
MCHC: 34 g/dL (ref 30.0–36.0)
MCV: 91.5 fL (ref 80.0–100.0)
Platelets: 233 10*3/uL (ref 150–400)
RBC: 4.12 MIL/uL — ABNORMAL LOW (ref 4.22–5.81)
RDW: 13.3 % (ref 11.5–15.5)
WBC: 16.5 10*3/uL — ABNORMAL HIGH (ref 4.0–10.5)
nRBC: 0 % (ref 0.0–0.2)

## 2021-06-27 LAB — HEMOGLOBIN A1C
Hgb A1c MFr Bld: 6 % — ABNORMAL HIGH (ref 4.8–5.6)
Mean Plasma Glucose: 125.5 mg/dL

## 2021-06-27 LAB — POCT ACTIVATED CLOTTING TIME
Activated Clotting Time: 277 seconds
Activated Clotting Time: 318 seconds
Activated Clotting Time: 155 seconds
Activated Clotting Time: 237 seconds

## 2021-06-27 LAB — GLUCOSE, CAPILLARY
Glucose-Capillary: 161 mg/dL — ABNORMAL HIGH (ref 70–99)
Glucose-Capillary: 181 mg/dL — ABNORMAL HIGH (ref 70–99)

## 2021-06-27 LAB — CREATININE, SERUM
Creatinine, Ser: 2.24 mg/dL — ABNORMAL HIGH (ref 0.61–1.24)
GFR, Estimated: 32 mL/min — ABNORMAL LOW (ref >60)

## 2021-06-27 SURGERY — ENDARTERECTOMY, CAROTID
Anesthesia: General | Site: Neck | Laterality: Right

## 2021-06-27 MED ORDER — LIDOCAINE 2% (20 MG/ML) 5 ML SYRINGE
INTRAMUSCULAR | Status: DC | PRN
  Administered 2021-06-27: 80 mg via INTRAVENOUS

## 2021-06-27 MED ORDER — INSULIN ASPART 100 UNIT/ML IJ SOLN
0.0000 [IU] | Freq: Three times a day (TID) | INTRAMUSCULAR | Status: DC
Start: 2021-06-27 — End: 2021-06-28
  Administered 2021-06-27: 2 [IU] via SUBCUTANEOUS
  Administered 2021-06-28: 1 [IU] via SUBCUTANEOUS

## 2021-06-27 MED ORDER — LIDOCAINE-EPINEPHRINE (PF) 1 %-1:200000 IJ SOLN
INTRAMUSCULAR | Status: AC
  Filled 2021-06-27: qty 30

## 2021-06-27 MED ORDER — ALUM & MAG HYDROXIDE-SIMETH 200-200-20 MG/5ML PO SUSP: 15.0000 mL | ORAL | Status: DC | PRN

## 2021-06-27 MED ORDER — HYDROMORPHONE HCL 1 MG/ML IJ SOLN: 0.5000 mg | INTRAMUSCULAR | Status: DC | PRN

## 2021-06-27 MED ORDER — DEXMEDETOMIDINE (PRECEDEX) IN NS 20 MCG/5ML (4 MCG/ML) IV SYRINGE
PREFILLED_SYRINGE | INTRAVENOUS | Status: AC
  Filled 2021-06-27: qty 5

## 2021-06-27 MED ORDER — SODIUM CHLORIDE 0.9 % IV SOLN: INTRAVENOUS | Status: DC

## 2021-06-27 MED ORDER — ONDANSETRON HCL 4 MG/2ML IJ SOLN
INTRAMUSCULAR | Status: DC | PRN
  Administered 2021-06-27: 4 mg via INTRAVENOUS

## 2021-06-27 MED ORDER — DEXAMETHASONE SODIUM PHOSPHATE 10 MG/ML IJ SOLN
INTRAMUSCULAR | Status: AC
  Filled 2021-06-27: qty 1

## 2021-06-27 MED ORDER — FENTANYL CITRATE (PF) 100 MCG/2ML IJ SOLN
INTRAMUSCULAR | Status: DC | PRN
  Administered 2021-06-27: 100 ug via INTRAVENOUS
  Administered 2021-06-27 (×2): 25 ug via INTRAVENOUS
  Administered 2021-06-27: 50 ug via INTRAVENOUS

## 2021-06-27 MED ORDER — HEPARIN SODIUM (PORCINE) 5000 UNIT/ML IJ SOLN
5000.0000 [IU] | Freq: Three times a day (TID) | INTRAMUSCULAR | Status: DC
  Administered 2021-06-28: 5000 [IU] via SUBCUTANEOUS
  Filled 2021-06-27: qty 1

## 2021-06-27 MED ORDER — DOCUSATE SODIUM 100 MG PO CAPS
100.0000 mg | ORAL_CAPSULE | Freq: Every day | ORAL | Status: DC
  Administered 2021-06-28: 100 mg via ORAL
  Filled 2021-06-27: qty 1

## 2021-06-27 MED ORDER — CHLORHEXIDINE GLUCONATE CLOTH 2 % EX PADS: 6.0000 | MEDICATED_PAD | Freq: Once | CUTANEOUS | Status: DC

## 2021-06-27 MED ORDER — SENNOSIDES-DOCUSATE SODIUM 8.6-50 MG PO TABS: 1.0000 | ORAL_TABLET | Freq: Every evening | ORAL | Status: DC | PRN

## 2021-06-27 MED ORDER — CLOPIDOGREL BISULFATE 75 MG PO TABS
75.0000 mg | ORAL_TABLET | Freq: Every day | ORAL | Status: DC
  Administered 2021-06-28: 75 mg via ORAL
  Filled 2021-06-27: qty 1

## 2021-06-27 MED ORDER — HEPARIN 6000 UNIT IRRIGATION SOLUTION
Status: DC | PRN
  Administered 2021-06-27: 1

## 2021-06-27 MED ORDER — LIDOCAINE HCL (PF) 1 % IJ SOLN
INTRAMUSCULAR | Status: AC
  Filled 2021-06-27: qty 30

## 2021-06-27 MED ORDER — PROTAMINE SULFATE 10 MG/ML IV SOLN
INTRAVENOUS | Status: AC
  Filled 2021-06-27: qty 5

## 2021-06-27 MED ORDER — DOXYLAMINE SUCCINATE (SLEEP) 25 MG PO TABS
25.0000 mg | ORAL_TABLET | Freq: Every evening | ORAL | Status: DC | PRN
  Filled 2021-06-27: qty 1

## 2021-06-27 MED ORDER — ORAL CARE MOUTH RINSE: 15.0000 mL | Freq: Once | OROMUCOSAL | Status: AC

## 2021-06-27 MED ORDER — PHENYLEPHRINE 40 MCG/ML (10ML) SYRINGE FOR IV PUSH (FOR BLOOD PRESSURE SUPPORT)
PREFILLED_SYRINGE | INTRAVENOUS | Status: DC | PRN
  Administered 2021-06-27 (×8): 80 ug via INTRAVENOUS

## 2021-06-27 MED ORDER — PROPOFOL 10 MG/ML IV BOLUS
INTRAVENOUS | Status: AC
  Filled 2021-06-27: qty 40

## 2021-06-27 MED ORDER — EPHEDRINE SULFATE-NACL 50-0.9 MG/10ML-% IV SOSY
PREFILLED_SYRINGE | INTRAVENOUS | Status: DC | PRN
  Administered 2021-06-27: 10 mg via INTRAVENOUS

## 2021-06-27 MED ORDER — NITROGLYCERIN IN D5W 200-5 MCG/ML-% IV SOLN
INTRAVENOUS | Status: AC
  Filled 2021-06-27: qty 250

## 2021-06-27 MED ORDER — CEFAZOLIN SODIUM-DEXTROSE 2-4 GM/100ML-% IV SOLN
2.0000 g | INTRAVENOUS | Status: AC
  Administered 2021-06-27: 2 g via INTRAVENOUS
  Filled 2021-06-27: qty 100

## 2021-06-27 MED ORDER — FENTANYL CITRATE (PF) 100 MCG/2ML IJ SOLN: 25.0000 ug | INTRAMUSCULAR | Status: DC | PRN

## 2021-06-27 MED ORDER — METOPROLOL TARTRATE 5 MG/5ML IV SOLN: 2.0000 mg | INTRAVENOUS | Status: DC | PRN

## 2021-06-27 MED ORDER — CHLORHEXIDINE GLUCONATE 0.12 % MT SOLN
15.0000 mL | Freq: Once | OROMUCOSAL | Status: AC
  Administered 2021-06-27: 15 mL via OROMUCOSAL
  Filled 2021-06-27: qty 15

## 2021-06-27 MED ORDER — HEPARIN 6000 UNIT IRRIGATION SOLUTION
Status: AC
  Filled 2021-06-27: qty 500

## 2021-06-27 MED ORDER — ACETAMINOPHEN 650 MG RE SUPP: 325.0000 mg | RECTAL | Status: DC | PRN

## 2021-06-27 MED ORDER — MIDAZOLAM HCL 2 MG/2ML IJ SOLN
INTRAMUSCULAR | Status: AC
  Filled 2021-06-27: qty 2

## 2021-06-27 MED ORDER — HYDRALAZINE HCL 20 MG/ML IJ SOLN
5.0000 mg | INTRAMUSCULAR | Status: AC | PRN
  Administered 2021-06-27 (×2): 5 mg via INTRAVENOUS
  Filled 2021-06-27 (×2): qty 1

## 2021-06-27 MED ORDER — ATORVASTATIN CALCIUM 80 MG PO TABS
80.0000 mg | ORAL_TABLET | Freq: Every day | ORAL | Status: DC
  Administered 2021-06-28: 80 mg via ORAL
  Filled 2021-06-27: qty 1

## 2021-06-27 MED ORDER — PANTOPRAZOLE SODIUM 40 MG PO TBEC
40.0000 mg | DELAYED_RELEASE_TABLET | Freq: Every day | ORAL | Status: DC
  Administered 2021-06-28: 40 mg via ORAL
  Filled 2021-06-27: qty 1

## 2021-06-27 MED ORDER — MIDAZOLAM HCL 5 MG/5ML IJ SOLN
INTRAMUSCULAR | Status: DC | PRN
  Administered 2021-06-27: 2 mg via INTRAVENOUS

## 2021-06-27 MED ORDER — ROCURONIUM BROMIDE 10 MG/ML (PF) SYRINGE
PREFILLED_SYRINGE | INTRAVENOUS | Status: DC | PRN
  Administered 2021-06-27: 10 mg via INTRAVENOUS
  Administered 2021-06-27: 60 mg via INTRAVENOUS
  Administered 2021-06-27 (×3): 20 mg via INTRAVENOUS

## 2021-06-27 MED ORDER — LIDOCAINE-EPINEPHRINE (PF) 1 %-1:200000 IJ SOLN
INTRAMUSCULAR | Status: DC | PRN
  Administered 2021-06-27: 10 mL

## 2021-06-27 MED ORDER — MAGNESIUM SULFATE 2 GM/50ML IV SOLN
2.0000 g | Freq: Every day | INTRAVENOUS | Status: DC | PRN
Start: 2021-06-27 — End: 2021-06-28

## 2021-06-27 MED ORDER — CARVEDILOL 25 MG PO TABS
25.0000 mg | ORAL_TABLET | Freq: Two times a day (BID) | ORAL | Status: DC
  Administered 2021-06-27 – 2021-06-28 (×2): 25 mg via ORAL
  Filled 2021-06-27 (×2): qty 1

## 2021-06-27 MED ORDER — SODIUM CHLORIDE 0.9 % IV SOLN: INTRAVENOUS | Status: DC | PRN

## 2021-06-27 MED ORDER — LIDOCAINE 2% (20 MG/ML) 5 ML SYRINGE
INTRAMUSCULAR | Status: AC
  Filled 2021-06-27: qty 5

## 2021-06-27 MED ORDER — PROTAMINE SULFATE 10 MG/ML IV SOLN
INTRAVENOUS | Status: DC | PRN
  Administered 2021-06-27: 20 mg via INTRAVENOUS
  Administered 2021-06-27: 30 mg via INTRAVENOUS

## 2021-06-27 MED ORDER — EMPAGLIFLOZIN 10 MG PO TABS
10.0000 mg | ORAL_TABLET | Freq: Every day | ORAL | Status: DC
  Administered 2021-06-28: 10 mg via ORAL
  Filled 2021-06-27: qty 1

## 2021-06-27 MED ORDER — EZETIMIBE 10 MG PO TABS
10.0000 mg | ORAL_TABLET | Freq: Every day | ORAL | Status: DC
  Administered 2021-06-28: 10 mg via ORAL
  Filled 2021-06-27: qty 1

## 2021-06-27 MED ORDER — CEFAZOLIN SODIUM-DEXTROSE 2-4 GM/100ML-% IV SOLN
2.0000 g | Freq: Three times a day (TID) | INTRAVENOUS | Status: AC
  Administered 2021-06-27 (×2): 2 g via INTRAVENOUS
  Filled 2021-06-27 (×2): qty 100

## 2021-06-27 MED ORDER — FENTANYL CITRATE (PF) 250 MCG/5ML IJ SOLN
INTRAMUSCULAR | Status: AC
  Filled 2021-06-27: qty 5

## 2021-06-27 MED ORDER — HEMOSTATIC AGENTS (NO CHARGE) OPTIME
TOPICAL | Status: DC | PRN
  Administered 2021-06-27 (×2): 1 via TOPICAL

## 2021-06-27 MED ORDER — 0.9 % SODIUM CHLORIDE (POUR BTL) OPTIME
TOPICAL | Status: DC | PRN
  Administered 2021-06-27: 2000 mL

## 2021-06-27 MED ORDER — PHENYLEPHRINE 40 MCG/ML (10ML) SYRINGE FOR IV PUSH (FOR BLOOD PRESSURE SUPPORT)
PREFILLED_SYRINGE | INTRAVENOUS | Status: AC
  Filled 2021-06-27: qty 10

## 2021-06-27 MED ORDER — LABETALOL HCL 5 MG/ML IV SOLN
10.0000 mg | INTRAVENOUS | Status: AC | PRN
  Administered 2021-06-27 (×4): 10 mg via INTRAVENOUS
  Filled 2021-06-27 (×3): qty 4

## 2021-06-27 MED ORDER — ROCURONIUM BROMIDE 10 MG/ML (PF) SYRINGE
PREFILLED_SYRINGE | INTRAVENOUS | Status: AC
  Filled 2021-06-27: qty 10

## 2021-06-27 MED ORDER — ATORVASTATIN CALCIUM 10 MG PO TABS: 10.0000 mg | ORAL_TABLET | Freq: Every day | ORAL | Status: DC

## 2021-06-27 MED ORDER — OXYCODONE-ACETAMINOPHEN 5-325 MG PO TABS
1.0000 | ORAL_TABLET | ORAL | Status: DC | PRN
  Administered 2021-06-27: 2 via ORAL
  Filled 2021-06-27: qty 2

## 2021-06-27 MED ORDER — ONDANSETRON HCL 4 MG/2ML IJ SOLN
INTRAMUSCULAR | Status: AC
  Filled 2021-06-27: qty 2

## 2021-06-27 MED ORDER — ACETAMINOPHEN 500 MG PO TABS: 500.0000 mg | ORAL_TABLET | Freq: Four times a day (QID) | ORAL | Status: DC | PRN

## 2021-06-27 MED ORDER — PROMETHAZINE HCL 25 MG/ML IJ SOLN: 6.2500 mg | INTRAMUSCULAR | Status: DC | PRN

## 2021-06-27 MED ORDER — DEXAMETHASONE SODIUM PHOSPHATE 10 MG/ML IJ SOLN
INTRAMUSCULAR | Status: DC | PRN
  Administered 2021-06-27: 10 mg via INTRAVENOUS

## 2021-06-27 MED ORDER — PROPOFOL 10 MG/ML IV BOLUS
INTRAVENOUS | Status: DC | PRN
  Administered 2021-06-27: 40 mg via INTRAVENOUS
  Administered 2021-06-27: 140 mg via INTRAVENOUS

## 2021-06-27 MED ORDER — ONDANSETRON HCL 4 MG/2ML IJ SOLN: 4.0000 mg | Freq: Four times a day (QID) | INTRAMUSCULAR | Status: DC | PRN

## 2021-06-27 MED ORDER — SODIUM CHLORIDE 0.9 % IV SOLN: 500.0000 mL | Freq: Once | INTRAVENOUS | Status: DC | PRN

## 2021-06-27 MED ORDER — SUGAMMADEX SODIUM 200 MG/2ML IV SOLN
INTRAVENOUS | Status: DC | PRN
  Administered 2021-06-27: 200 mg via INTRAVENOUS

## 2021-06-27 MED ORDER — LABETALOL HCL 5 MG/ML IV SOLN
INTRAVENOUS | Status: AC
  Administered 2021-06-27: 10 mg via INTRAVENOUS
  Filled 2021-06-27: qty 4

## 2021-06-27 MED ORDER — HEPARIN SODIUM (PORCINE) 1000 UNIT/ML IJ SOLN
INTRAMUSCULAR | Status: DC | PRN
  Administered 2021-06-27: 2000 [IU] via INTRAVENOUS
  Administered 2021-06-27: 9000 [IU] via INTRAVENOUS

## 2021-06-27 MED ORDER — SACUBITRIL-VALSARTAN 97-103 MG PO TABS
1.0000 | ORAL_TABLET | Freq: Two times a day (BID) | ORAL | Status: DC
  Administered 2021-06-27 – 2021-06-28 (×2): 1 via ORAL
  Filled 2021-06-27 (×2): qty 1

## 2021-06-27 MED ORDER — FUROSEMIDE 20 MG PO TABS: 20.0000 mg | ORAL_TABLET | Freq: Every day | ORAL | Status: DC | PRN

## 2021-06-27 MED ORDER — ACETAMINOPHEN 325 MG PO TABS: 325.0000 mg | ORAL_TABLET | ORAL | Status: DC | PRN

## 2021-06-27 MED ORDER — BISACODYL 5 MG PO TBEC: 5.0000 mg | DELAYED_RELEASE_TABLET | Freq: Every day | ORAL | Status: DC | PRN

## 2021-06-27 MED ORDER — PHENYLEPHRINE HCL-NACL 20-0.9 MG/250ML-% IV SOLN
INTRAVENOUS | Status: DC | PRN
  Administered 2021-06-27: 20 ug/min via INTRAVENOUS

## 2021-06-27 MED ORDER — PHENOL 1.4 % MT LIQD: 1.0000 | OROMUCOSAL | Status: DC | PRN

## 2021-06-27 MED ORDER — LABETALOL HCL 5 MG/ML IV SOLN
10.0000 mg | Freq: Once | INTRAVENOUS | Status: AC
  Filled 2021-06-27: qty 4

## 2021-06-27 MED ORDER — DEXTRAN 40 IN SALINE 10-0.9 % IV SOLN
INTRAVENOUS | Status: AC | PRN
  Administered 2021-06-27: 500 mL

## 2021-06-27 MED ORDER — GUAIFENESIN-DM 100-10 MG/5ML PO SYRP: 15.0000 mL | ORAL_SOLUTION | ORAL | Status: DC | PRN

## 2021-06-27 MED ORDER — POTASSIUM CHLORIDE CRYS ER 20 MEQ PO TBCR: 20.0000 meq | EXTENDED_RELEASE_TABLET | Freq: Every day | ORAL | Status: DC | PRN

## 2021-06-27 MED ORDER — POLYVINYL ALCOHOL 1.4 % OP SOLN
1.0000 [drp] | OPHTHALMIC | Status: DC | PRN
  Filled 2021-06-27: qty 15

## 2021-06-27 MED ORDER — ASPIRIN EC 81 MG PO TBEC
81.0000 mg | DELAYED_RELEASE_TABLET | Freq: Every day | ORAL | Status: DC
  Administered 2021-06-28: 81 mg via ORAL
  Filled 2021-06-27: qty 1

## 2021-06-27 MED ORDER — ACETAMINOPHEN 500 MG PO TABS
1000.0000 mg | ORAL_TABLET | Freq: Once | ORAL | Status: AC
  Administered 2021-06-27: 1000 mg via ORAL
  Filled 2021-06-27: qty 2

## 2021-06-27 MED ORDER — THROMBIN (RECOMBINANT) 20000 UNITS EX SOLR
CUTANEOUS | Status: AC
  Filled 2021-06-27: qty 20000

## 2021-06-27 MED ORDER — HEPARIN SODIUM (PORCINE) 1000 UNIT/ML IJ SOLN
INTRAMUSCULAR | Status: AC
  Filled 2021-06-27: qty 2

## 2021-06-27 SURGICAL SUPPLY — 58 items
ADH SKN CLS APL DERMABOND .7 (GAUZE/BANDAGES/DRESSINGS) ×1
AGENT HMST 10 BLLW SHRT CANN (HEMOSTASIS) ×1
BAG COUNTER SPONGE SURGICOUNT (BAG) ×2 IMPLANT
BAG DECANTER FOR FLEXI CONT (MISCELLANEOUS) ×3 IMPLANT
BAG SPNG CNTER NS LX DISP (BAG) ×1
BAG SURGICOUNT SPONGE COUNTING (BAG) ×1
BIOPATCH RED 1 DISK 7.0 (GAUZE/BANDAGES/DRESSINGS) ×1 IMPLANT
BIOPATCH RED 1IN DISK 7.0MM (GAUZE/BANDAGES/DRESSINGS) ×1
CANISTER SUCT 3000ML PPV (MISCELLANEOUS) ×3 IMPLANT
CANNULA VESSEL 3MM 2 BLNT TIP (CANNULA) ×9 IMPLANT
CATH ROBINSON RED A/P 18FR (CATHETERS) ×3 IMPLANT
CLIP VESOCCLUDE MED 24/CT (CLIP) ×3 IMPLANT
CLIP VESOCCLUDE SM WIDE 24/CT (CLIP) ×3 IMPLANT
DERMABOND ADVANCED (GAUZE/BANDAGES/DRESSINGS) ×2
DERMABOND ADVANCED .7 DNX12 (GAUZE/BANDAGES/DRESSINGS) ×1 IMPLANT
DRAIN CHANNEL 15F RND FF W/TCR (WOUND CARE) ×2 IMPLANT
ELECT REM PT RETURN 9FT ADLT (ELECTROSURGICAL) ×3
ELECTRODE REM PT RTRN 9FT ADLT (ELECTROSURGICAL) ×1 IMPLANT
EVACUATOR SILICONE 100CC (DRAIN) ×2 IMPLANT
GAUZE 4X4 16PLY ~~LOC~~+RFID DBL (SPONGE) ×2 IMPLANT
GAUZE SPONGE 4X4 12PLY STRL (GAUZE/BANDAGES/DRESSINGS) ×2 IMPLANT
GEL ULTRASOUND 8.5O AQUASONIC (MISCELLANEOUS) ×2 IMPLANT
GLOVE SRG 8 PF TXTR STRL LF DI (GLOVE) ×1 IMPLANT
GLOVE SURG ENC MOIS LTX SZ7.5 (GLOVE) ×3 IMPLANT
GLOVE SURG POLY ORTHO LF SZ7.5 (GLOVE) IMPLANT
GLOVE SURG UNDER LTX SZ8 (GLOVE) ×3 IMPLANT
GLOVE SURG UNDER POLY LF SZ8 (GLOVE) ×3
GOWN STRL REUS W/ TWL LRG LVL3 (GOWN DISPOSABLE) ×3 IMPLANT
GOWN STRL REUS W/TWL LRG LVL3 (GOWN DISPOSABLE) ×9
GRAFT VASC PATCH XENOSURE 1X14 (Vascular Products) ×2 IMPLANT
HEMOSTAT HEMOBLAST BELLOWS (HEMOSTASIS) ×2 IMPLANT
HEMOSTAT SNOW SURGICEL 2X4 (HEMOSTASIS) ×2 IMPLANT
KIT BASIN OR (CUSTOM PROCEDURE TRAY) ×3 IMPLANT
KIT SHUNT ARGYLE CAROTID ART 6 (VASCULAR PRODUCTS) ×2 IMPLANT
KIT TURNOVER KIT B (KITS) ×3 IMPLANT
NDL HYPO 25GX1X1/2 BEV (NEEDLE) ×1 IMPLANT
NEEDLE HYPO 25GX1X1/2 BEV (NEEDLE) ×3 IMPLANT
NS IRRIG 1000ML POUR BTL (IV SOLUTION) ×6 IMPLANT
PACK CAROTID (CUSTOM PROCEDURE TRAY) ×3 IMPLANT
PAD ARMBOARD 7.5X6 YLW CONV (MISCELLANEOUS) ×6 IMPLANT
POSITIONER HEAD DONUT 9IN (MISCELLANEOUS) ×3 IMPLANT
SET WALTER ACTIVATION W/DRAPE (SET/KITS/TRAYS/PACK) ×4 IMPLANT
SHUNT CAROTID BYPASS 12 (VASCULAR PRODUCTS) IMPLANT
SPONGE SURGIFOAM ABS GEL 100 (HEMOSTASIS) IMPLANT
SPONGE T-LAP 18X18 ~~LOC~~+RFID (SPONGE) ×2 IMPLANT
SUT ETHILON 3 0 PS 1 (SUTURE) ×2 IMPLANT
SUT MNCRL AB 4-0 PS2 18 (SUTURE) ×3 IMPLANT
SUT PROLENE 6 0 BV (SUTURE) ×14 IMPLANT
SUT SILK 2 0 (SUTURE) ×3
SUT SILK 2 0 PERMA HAND 18 BK (SUTURE) IMPLANT
SUT SILK 2-0 18XBRD TIE 12 (SUTURE) IMPLANT
SUT VIC AB 3-0 SH 27 (SUTURE) ×3
SUT VIC AB 3-0 SH 27X BRD (SUTURE) ×1 IMPLANT
SYR 20ML LL LF (SYRINGE) ×3 IMPLANT
SYR CONTROL 10ML LL (SYRINGE) ×3 IMPLANT
TAPE CLOTH SURG 4X10 WHT LF (GAUZE/BANDAGES/DRESSINGS) ×2 IMPLANT
TOWEL GREEN STERILE (TOWEL DISPOSABLE) ×3 IMPLANT
WATER STERILE IRR 1000ML POUR (IV SOLUTION) ×3 IMPLANT

## 2021-06-27 NOTE — Interval H&P Note (Signed)
History and Physical Interval Note:  06/27/2021 7:10 AM  Martin Archer  has presented today for surgery, with the diagnosis of carotid artery stenosis.  The various methods of treatment have been discussed with the patient and family. After consideration of risks, benefits and other options for treatment, the patient has consented to  Procedure(s): RIGHT REDO CAROTID ARTERY ENDARTERECTOMY (Right) as a surgical intervention.  The patient's history has been reviewed, patient examined, no change in status, stable for surgery.  I have reviewed the patient's chart and labs.  Questions were answered to the patient's satisfaction.     [name]

## 2021-06-27 NOTE — Op Note (Signed)
NAME: Martin Archer    MRN: [id number] DOB: 25-Aug-1961    DATE OF OPERATION: 06/27/2021  PREOP DIAGNOSIS:    Recurrent right carotid stenosis  POSTOP DIAGNOSIS:    Same  PROCEDURE:    Redo right carotid endarterectomy with bovine pericardial patch angioplasty  SURGEON: Judeth Cornfield. Scot Dock, MD  ASSIST: Laurence Slate, PA  ANESTHESIA: General  EBL: 100 cc  INDICATIONS:    Martin Archer is a 60 y.o. male who had previously undergone bilateral carotid endarterectomies by Dr. Kellie Simmering.  He had a known chronic occlusion on the left and presented with a greater than 80% right carotid stenosis.  Right carotid endarterectomy was recommended in order to lower his risk of future stroke.  Of note he had a significantly elevated creatinine and we wanted to get a CT angiogram to consider him for TCAR.  We recommended renal evaluation however he felt strongly about proceeding sooner than later and I felt he was a reasonable candidate for redo carotid endarterectomy.  FINDINGS:   There was diffuse disease in the common carotid artery which extended very low.  The plaque extended high into the patch and the artery beyond the patch was fairly small.  TECHNIQUE:   The patient was taken to the operating room and received a general anesthetic.  In anticipation of possibly using vein I did look at the right great saphenous vein with the SonoSite and it appeared to be of adequate size.  I injected 1% lidocaine in the neck and the patient was then prepped and draped in usual sterile fashion.  A longitudinal incision was made through the previous incision in the right neck.  The incision was carried down through dense scar tissue where the common carotid artery was identified.  Of note based on the preoperative duplex and my own assessment of the artery with the SonoSite there was plaque extending fairly low into the common carotid artery so therefore extended the incision proximally and  distally dissected fairly low on the common carotid artery.  I heparinized once the common carotid artery was controlled.  ACT was monitored throughout the procedure.  The dissection was then continued distally.  There was dense scar tissue and the dissection was tedious.  I was able to dissected out the proximal external carotid artery.  I then was able to identify the distal extent of the patch and was able to dissected out the internal carotid artery circumferentially above the patch.  This was a fairly small artery.  Next of the internal carotid artery, common carotid artery, and external carotid arteries were clamped and a longitudinal arteriotomy made through the patch.  There was diffuse plaque and ulceration throughout the entire common carotid artery and extending up to the distal extent of the patch.  I placed the 10 shunt into the internal carotid artery and this was backbled and placed into the common carotid artery and secured with Rummel tourniquet.  Flow was reestablished through the shunt and flow was checked with the Doppler.  An endarterectomy plane was established proximally and the plaque was sharply divided.  Eversion endarterectomy was performed of the external carotid artery.  The dissection was continued proximally and the endarterectomy plane is continued up to the distal extent of the patch.  Distally there was a fairly nice taper and 2 tacking sutures were placed.  The artery was irrigated with copious amounts of heparin and dextran and all loose debris removed.  A bovine pericardial patch was then  sewn using 2 continuous 6-0 Prolene sutures.  Prior to completing the patch closure the shunt was removed.  The arteries were backbled and flushed appropriately and the anastomosis completed.  Flow was reestablished first to the external carotid artery and then to the internal carotid artery.  Flow was checked with a Doppler and there was excellent flow in the internal carotid artery with  good diastolic flow.  Several repair sutures were placed in the external carotid artery proximally and on the patch.  Once good hemostasis was obtained I did place a 15 Blake drain given that there was a redo operation and he was on Plavix and aspirin.  The deep layer was closed with running 3-0 Vicryl.  The platysma was closed with running 3-0 Vicryl.  The skin was closed with a 4-0 Monocryl.  Dermabond was applied.  The patient awoke neurologically intact.  All needle and sponge counts were correct.  He was transferred to the recovery room in stable condition.  Given the complexity of the case a first assistant was necessary in order to expedient the procedure and safely perform the technical aspects of the operation.  [name], MD, FACS [facility]  DATE OF DICTATION:   06/27/2021

## 2021-06-27 NOTE — Anesthesia Procedure Notes (Signed)
Procedure Name: Intubation Date/Time: 06/27/2021 7:42 AM Performed by: Vonna Drafts, CRNA Pre-anesthesia Checklist: Patient identified, Emergency Drugs available, Suction available and Patient being monitored Patient Re-evaluated:Patient Re-evaluated prior to induction Oxygen Delivery Method: Circle system utilized Preoxygenation: Pre-oxygenation with 100% oxygen Induction Type: IV induction Ventilation: Two handed mask ventilation required Laryngoscope Size: Mac and 4 Grade View: Grade I Tube type: Oral Tube size: 7.5 mm Number of attempts: 1 Airway Equipment and Method: Stylet and Oral airway Placement Confirmation: ETT inserted through vocal cords under direct vision, positive ETCO2 and breath sounds checked- equal and bilateral Secured at: 23 cm Tube secured with: Tape Dental Injury: Teeth and Oropharynx as per pre-operative assessment

## 2021-06-27 NOTE — Anesthesia Procedure Notes (Signed)
Arterial Line Insertion Start/End7/05/2021 7:23 AM, 03/15/2021 7:23 AM Performed by: Catalina Gravel, MD, anesthesiologist  Patient location: Pre-op. Preanesthetic checklist: patient identified, IV checked, site marked, risks and benefits discussed, surgical consent, monitors and equipment checked, pre-op evaluation, timeout performed and anesthesia consent Lidocaine 1% used for infiltration Right, radial was placed Catheter size: 20 G Hand hygiene performed  and maximum sterile barriers used   Attempts: 3 Procedure performed using ultrasound guided technique. Ultrasound Notes:anatomy identified, needle tip was noted to be adjacent to the nerve/plexus identified, no ultrasound evidence of intravascular and/or intraneural injection and image(s) printed for medical record Following insertion, dressing applied and Biopatch. Post procedure assessment: normal and unchanged  Post procedure complications: second provider assisted.

## 2021-06-27 NOTE — Transfer of Care (Signed)
Immediate Anesthesia Transfer of Care Note  Patient: Martin Archer  Procedure(s) Performed: RIGHT REDO CAROTID ARTERY ENDARTERECTOMY & PLACEMENT OF 15Fr DRAIN (Right: Neck) PATCH ANGIOPLASTY USING XENOSURE BIOLOGIC PATCH (Right: Neck)  Patient Location: PACU  Anesthesia Type:General  Level of Consciousness: drowsy  Airway & Oxygen Therapy: Patient Spontanous Breathing and Patient connected to face mask oxygen  Post-op Assessment: Report given to RN and Post -op Vital signs reviewed and stable  Post vital signs: Reviewed and stable  Last Vitals:  Vitals Value Taken Time  BP 155/83 06/27/21 1118  Temp    Pulse 84 06/27/21 1123  Resp 12 06/27/21 1122  SpO2 97 % 06/27/21 1123  Vitals shown include unvalidated device data.  Last Pain:  Vitals:   06/27/21 0639  TempSrc:   PainSc: 0-No pain         Complications: No notable events documented.

## 2021-06-27 NOTE — Progress Notes (Signed)
Patient's B/P elevated 188/101. Dr. Gifford Shave aware and orders received. Will continue to monitor.

## 2021-06-27 NOTE — Progress Notes (Signed)
   VASCULAR SURGERY POSTOP:   Doing well postop.  No specific complaints.  He is receiving IV labetalol for some hypertension.  His drain has minimal drainage.  He is on aspirin, Plavix, and a statin.  SUBJECTIVE:   No specific complaints.  PHYSICAL EXAM:   Vitals:   06/27/21 1400 06/27/21 1500 06/27/21 1540 06/27/21 1600  BP: (!) 175/99 (!) 182/95 (!) 182/95 (!) 199/114  Pulse: 81 79 79 81  Resp: 14 14 14 14   Temp:   97.8 F (36.6 C) 98.1 F (36.7 C)  TempSrc:   Oral Oral  SpO2: 94% 97% 97% 99%  Weight:      Height:       NEURO: No focal weakness or paresthesias. His incision looks fine.  LABS:   CBG (last 3)  Recent Labs    06/27/21 1554  GLUCAP 161*    PROBLEM LIST:    Active Problems:   Carotid stenosis   Carotid artery stenosis   CURRENT MEDS:    aspirin EC  81 mg Oral Daily   [START ON 06/28/2021] atorvastatin  80 mg Oral Daily   carvedilol  25 mg Oral BID   [START ON 06/28/2021] clopidogrel  75 mg Oral Q breakfast   [START ON 06/28/2021] docusate sodium  100 mg Oral Daily   [START ON 06/28/2021] empagliflozin  10 mg Oral QAC breakfast   ezetimibe  10 mg Oral Daily   [START ON 06/28/2021] heparin  5,000 Units Subcutaneous Q8H   insulin aspart  0-9 Units Subcutaneous TID WC   pantoprazole  40 mg Oral Daily   sacubitril-valsartan  1 tablet Oral BID    [name] Office: [phone] 06/27/2021

## 2021-06-27 NOTE — Anesthesia Postprocedure Evaluation (Signed)
Anesthesia Post Note  Patient: Martin Archer  Procedure(s) Performed: RIGHT REDO CAROTID ARTERY ENDARTERECTOMY & PLACEMENT OF 15Fr DRAIN (Right: Neck) PATCH ANGIOPLASTY USING XENOSURE BIOLOGIC PATCH (Right: Neck)     Patient location during evaluation: PACU Anesthesia Type: General Level of consciousness: awake and alert Pain management: pain level controlled Vital Signs Assessment: post-procedure vital signs reviewed and stable Respiratory status: spontaneous breathing, nonlabored ventilation, respiratory function stable and patient connected to nasal cannula oxygen Cardiovascular status: blood pressure returned to baseline and stable Postop Assessment: no apparent nausea or vomiting Anesthetic complications: no   No notable events documented.  Last Vitals:  Vitals:   06/27/21 1320 06/27/21 1355  BP: (!) 147/95 (!) 147/95  Pulse: 78 78  Resp: 13 13  Temp: 36.6 C 36.6 C  SpO2: 96% 96%    Last Pain:  Vitals:   06/27/21 1400  TempSrc:   PainSc: 0-No pain                 Catalina Gravel

## 2021-06-28 ENCOUNTER — Encounter (HOSPITAL_COMMUNITY): Payer: Self-pay | Admitting: Vascular Surgery

## 2021-06-28 DIAGNOSIS — Z79899 Other long term (current) drug therapy: Secondary | ICD-10-CM

## 2021-06-28 DIAGNOSIS — Z7902 Long term (current) use of antithrombotics/antiplatelets: Secondary | ICD-10-CM

## 2021-06-28 DIAGNOSIS — Z7982 Long term (current) use of aspirin: Secondary | ICD-10-CM

## 2021-06-28 LAB — CBC
HCT: 37.5 % — ABNORMAL LOW (ref 39.0–52.0)
Hemoglobin: 12.7 g/dL — ABNORMAL LOW (ref 13.0–17.0)
MCH: 31.1 pg (ref 26.0–34.0)
MCHC: 33.9 g/dL (ref 30.0–36.0)
MCV: 91.7 fL (ref 80.0–100.0)
Platelets: 259 10*3/uL (ref 150–400)
RBC: 4.09 MIL/uL — ABNORMAL LOW (ref 4.22–5.81)
RDW: 13.5 % (ref 11.5–15.5)
WBC: 18.9 10*3/uL — ABNORMAL HIGH (ref 4.0–10.5)
nRBC: 0 % (ref 0.0–0.2)

## 2021-06-28 LAB — LIPID PANEL
Cholesterol: 122 mg/dL (ref 0–200)
HDL: 37 mg/dL — ABNORMAL LOW (ref >40)
LDL Cholesterol: 62 mg/dL (ref 0–99)
Total CHOL/HDL Ratio: 3.3 RATIO
Triglycerides: 114 mg/dL (ref <150)
VLDL: 23 mg/dL (ref 0–40)

## 2021-06-28 LAB — BASIC METABOLIC PANEL
Anion gap: 8 (ref 5–15)
BUN: 37 mg/dL — ABNORMAL HIGH (ref 8–23)
CO2: 22 mmol/L (ref 22–32)
Calcium: 8.3 mg/dL — ABNORMAL LOW (ref 8.9–10.3)
Chloride: 106 mmol/L (ref 98–111)
Creatinine, Ser: 2.01 mg/dL — ABNORMAL HIGH (ref 0.61–1.24)
GFR, Estimated: 37 mL/min — ABNORMAL LOW (ref >60)
Glucose, Bld: 129 mg/dL — ABNORMAL HIGH (ref 70–99)
Potassium: 4.5 mmol/L (ref 3.5–5.1)
Sodium: 136 mmol/L (ref 135–145)

## 2021-06-28 LAB — GLUCOSE, CAPILLARY: Glucose-Capillary: 136 mg/dL — ABNORMAL HIGH (ref 70–99)

## 2021-06-28 MED ORDER — OXYCODONE-ACETAMINOPHEN 5-325 MG PO TABS: 1.0000 | ORAL_TABLET | Freq: Four times a day (QID) | ORAL | 0 refills | Status: DC | PRN

## 2021-06-28 NOTE — Progress Notes (Signed)
PHARMACIST LIPID MONITORING   Martin Archer is a 60 y.o. male admitted on 06/27/2021 with re-do R CEA.  Pharmacy has been consulted to optimize lipid-lowering therapy with the indication of secondary prevention for clinical ASCVD.  Recent Labs:  Lipid Panel (last 6 months):   Lab Results  Component Value Date   CHOL 122 06/28/2021   TRIG 114 06/28/2021   HDL 37 (L) 06/28/2021   CHOLHDL 3.3 06/28/2021   VLDL 23 06/28/2021   LDLCALC 62 06/28/2021    Hepatic function panel (last 6 months):   Lab Results  Component Value Date   AST 23 06/23/2021   ALT 24 06/23/2021   ALKPHOS 102 06/23/2021   BILITOT 0.7 06/23/2021    SCr (since admission):   Serum creatinine: 2.01 mg/dL (H) 06/28/21 0430 Estimated creatinine clearance: 42.3 mL/min (A)  Current therapy and lipid therapy tolerance Current lipid-lowering therapy: Atorvastatin 80 mg and Ezetimibe 10 mg daily Previous lipid-lowering therapies (if applicable): n/a Documented or reported allergies or intolerances to lipid-lowering therapies (if applicable): n/a  Assessment:    Atorvastatin 10 mg daily ordered on admit post-op 11/1 > changed to home dose of 80 mg daily.   Per 04/13/21 [facility] Pharmacist note, LDL 88 on 12/19/20 and ezetimibe was added about 2 months prior; Atorvastatin 80 mg daily continued. Goal < 70 with CAD and PAD, but ideally < 55 if possible.  LDL 62 today.   Plan:    1.Statin intensity (high intensity recommended for all patients regardless of the LDL):  No statin changes. The patient is already on a high intensity statin.  2.Continue ezetimibe.    3. Plan is already in place to follow up with [facility].  May consider changing ezetimibe to Nexlizet if further lipid-lowering therapy is needed.   [name], [place] 06/28/2021, 10:27 AM

## 2021-06-28 NOTE — Evaluation (Signed)
Occupational Therapy Evaluation/Discharge Patient Details Name: Martin Archer MRN: [id number] DOB: 12/14/1960 Today's Date: 06/28/2021   History of Present Illness Pt is a 60 y/o male with carotid artery stenosis who presented for redo R carotid artery endarterectomy on 11/1. PMH: anemia, arthritis, diverticulitis, GERD, HTN, stroke, sleep apnea   Clinical Impression   PTA, pt lives alone and reports complete Independence in all daily tasks. Pt presents now at baseline for ADLs and short distance mobility without LOB or need for AD. Pt endorses mild lightheadedness with activity but BP WFL pre- and post-activity. Educated re: avoiding strenuous activity, assistance for transportation and grocery shopping initially with pt verbalizing understanding. No further skilled OT services needed at acute level or on DC.       Recommendations for follow up therapy are one component of a multi-disciplinary discharge planning process, led by the attending physician.  Recommendations may be updated based on patient status, additional functional criteria and insurance authorization.   Follow Up Recommendations  No OT follow up    Assistance Recommended at Discharge PRN (assist with transportation, groceries initially)  Functional Status Assessment  Patient has not had a recent decline in their functional status  Equipment Recommendations  None recommended by OT    Recommendations for Other Services       Precautions / Restrictions Precautions Precautions: Fall Precaution Comments: low fall risk, JP drain Restrictions Weight Bearing Restrictions: No      Mobility Bed Mobility Overal bed mobility: Modified Independent                  Transfers Overall transfer level: Independent Equipment used: None                      Balance Overall balance assessment: No apparent balance deficits (not formally assessed)                                          ADL either performed or assessed with clinical judgement   ADL Overall ADL's : Independent                                       General ADL Comments: able to don underwear, clean gown, ambulate to/from bathroom without AD and complete toiletiing task independently     Vision Baseline Vision/History: 0 No visual deficits Ability to See in Adequate Light: 0 Adequate Patient Visual Report: No change from baseline Vision Assessment?: No apparent visual deficits     Perception     Praxis      Pertinent Vitals/Pain Pain Assessment: No/denies pain     Hand Dominance Right   Extremity/Trunk Assessment Upper Extremity Assessment Upper Extremity Assessment: Overall WFL for tasks assessed   Lower Extremity Assessment Lower Extremity Assessment: Defer to PT evaluation   Cervical / Trunk Assessment Cervical / Trunk Assessment: Normal   Communication Communication Communication: HOH;Other (comment) (hearing aids)   Cognition Arousal/Alertness: Awake/alert Behavior During Therapy: WFL for tasks assessed/performed Overall Cognitive Status: Within Functional Limits for tasks assessed                                       General Comments  BP WFL pre and  post activity, reports mild lightheadedness but pt suspects this is from being in bed    Exercises     Shoulder Instructions      Home Living Family/patient expects to be discharged to:: Private residence Living Arrangements: Alone Available Help at Discharge: Family;Other (Comment) (sister plans to stay with pt for a few days. Has 2 daughters nearby that can assist prn) Type of Home: Apartment Home Access: Stairs to enter [employer] of Steps: flight (approx 12) Entrance Stairs-Rails: Right;Left Home Layout: One level     Bathroom Shower/Tub: Teacher, early years/pre: Standard     Home Equipment: Other (comment) (no use of AD)          Prior  Functioning/Environment Prior Level of Function : Independent/Modified Independent;Driving             Mobility Comments: No use of AD for mobility, no falls ADLs Comments: Independent with ADLs, iADLs, driving and grocery shopping.        OT Problem List:        OT Treatment/Interventions:      OT Goals(Current goals can be found in the care plan section) Acute Rehab OT Goals Patient Stated Goal: go home today OT Goal Formulation: All assessment and education complete, DC therapy  OT Frequency:     Barriers to D/C:            Co-evaluation              AM-PAC OT "6 Clicks" Daily Activity     Outcome Measure Help from another person eating meals?: None Help from another person taking care of personal grooming?: None Help from another person toileting, which includes using toliet, bedpan, or urinal?: None Help from another person bathing (including washing, rinsing, drying)?: None Help from another person to put on and taking off regular upper body clothing?: None Help from another person to put on and taking off regular lower body clothing?: None 6 Click Score: 24   End of Session Nurse Communication: Mobility status;Other (comment) (communicated with NT)  Activity Tolerance: Patient tolerated treatment well Patient left: in chair;with call bell/phone within reach  OT Visit Diagnosis: Muscle weakness (generalized) (M62.81)                Time: 3734-2876 OT Time Calculation (min): 22 min Charges:  OT General Charges $OT Visit: 1 Visit OT Evaluation $OT Eval Low Complexity: 1 Low  Malachy Chamber, OTR/L Acute Rehab Services Office: [phone]   Layla Maw 06/28/2021, 7:31 AM

## 2021-06-28 NOTE — Progress Notes (Signed)
Patient given discharge instructions and stated understanding. 

## 2021-06-28 NOTE — Evaluation (Signed)
Physical Therapy Evaluation & Discharge Patient Details Name: Martin Archer MRN: [id number] DOB: [birth year] Today's Date: 06/28/2021  History of Present Illness  Pt is a 60 y.o. male admitted and s/p redo of R carotid artery endarterectomy on 06/27/21. PMH includes HTN, stroke, OSA, GERD, arthritis.   Clinical Impression  Patient evaluated by Physical Therapy with no further acute PT needs identified. PTA, pt independent, lives alone with family nearby, drives. Today, pt independent with mobility, including ambulation and stairs; no instability or LOB noted with higher level balance tasks. All education has been completed and the patient has no further questions. Acute PT is signing off. Thank you for this referral.   Recommendations for follow up therapy are one component of a multi-disciplinary discharge planning process, led by the attending physician.  Recommendations may be updated based on patient status, additional functional criteria and insurance authorization.  Follow Up Recommendations No PT follow up    Assistance Recommended at Discharge PRN  Functional Status Assessment    Equipment Recommendations  None recommended by PT    Recommendations for Other Services       Precautions / Restrictions Precautions Precautions: None Precaution Comments: Restrictions Weight Bearing Restrictions: No      Mobility  Bed Mobility Overal bed mobility: Modified Independent             General bed mobility comments: received sitting in recliner    Transfers Overall transfer level: Independent Equipment used: None                    Ambulation/Gait Ambulation/Gait assistance: Independent Gait Distance (Feet): 400 Feet Assistive device: None Gait Pattern/deviations: Step-through pattern;Decreased stride length Gait velocity: Decreased   General Gait Details: Slow, steady gait independent without DME; no overt instability or LOB noted  Stairs Stairs:  Yes Stairs assistance: Modified independent (Device/Increase time) Stair Management: One rail Right;Forwards;Alternating pattern;Step to pattern Number of Stairs: 11 General stair comments: mod indep with rail support; alternating to ascend steps, step-to for descent; able to static stand on steps without UE support  Wheelchair Mobility    Modified Rankin (Stroke Patients Only)       Balance Overall balance assessment: No apparent balance deficits (not formally assessed)   Sitting balance-Leahy Scale: Good       Standing balance-Leahy Scale: Good               High level balance activites: Side stepping;Direction changes;Turns;Head turns;Sudden stops High Level Balance Comments: No overt LOB or instability noted with higher level balance tasks             Pertinent Vitals/Pain Pain Assessment: Faces Faces Pain Scale: Hurts a little bit Pain Location: R neck incision Pain Descriptors / Indicators: Discomfort Pain Intervention(s): Monitored during session    Home Living Family/patient expects to be discharged to:: Private residence Living Arrangements: Alone Available Help at Discharge: Family;Available PRN/intermittently Type of Home: Apartment Home Access: Stairs to enter Entrance Stairs-Rails: Right;Left Entrance Stairs-Number of Steps: flight (approx 12)   Home Layout: One level Home Equipment: None Additional Comments: Sister can provide initial assist, lives nearby    Prior Function Prior Level of Function : Independent/Modified Independent;Driving             Mobility Comments: INdependent without DME, enjoys walking ADLs Comments: Independent with ADLs, iADLs, driving and grocery shopping.     Hand Dominance   Dominant Hand: Right    Extremity/Trunk Assessment   Upper Extremity Assessment Upper Extremity  Assessment: Overall WFL for tasks assessed    Lower Extremity Assessment Lower Extremity Assessment: Overall WFL for tasks  assessed    Cervical / Trunk Assessment Cervical / Trunk Assessment: Normal  Communication   Communication: HOH;Other (comment) (hearing aids)  Cognition Arousal/Alertness: Awake/alert Behavior During Therapy: WFL for tasks assessed/performed Overall Cognitive Status: Within Functional Limits for tasks assessed                                          General Comments General comments (skin integrity, edema, etc.): HR 97; pt denies lightheadedness with activity, reports "eyes are still dry from surgery"    Exercises Other Exercises Other Exercises: educ on gentle cervical ROM, shoulder rolls, scap protract/retraction   Assessment/Plan    PT Assessment Patient does not need any further PT services  PT Problem List         PT Treatment Interventions      PT Goals (Current goals can be found in the Care Plan section)  Acute Rehab PT Goals PT Goal Formulation: All assessment and education complete, DC therapy    Frequency     Barriers to discharge        Co-evaluation               AM-PAC PT "6 Clicks" Mobility  Outcome Measure Help needed turning from your back to your side while in a flat bed without using bedrails?: None Help needed moving from lying on your back to sitting on the side of a flat bed without using bedrails?: None Help needed moving to and from a bed to a chair (including a wheelchair)?: None Help needed standing up from a chair using your arms (e.g., wheelchair or bedside chair)?: None Help needed to walk in hospital room?: None Help needed climbing 3-5 steps with a railing? : None 6 Click Score: 24    End of Session   Activity Tolerance: Patient tolerated treatment well Patient left: in chair;with call bell/phone within reach Nurse Communication: Mobility status PT Visit Diagnosis: Other abnormalities of gait and mobility (R26.89)    Time: 0812-0825 PT Time Calculation (min) (ACUTE ONLY): 13 min   Charges:   PT  Evaluation $PT Eval Low Complexity: 1 Low     Mabeline Caras, PT, DPT Acute Rehabilitation Services  Pager [phone] Office [phone]  Derry Lory 06/28/2021, 9:43 AM

## 2021-06-28 NOTE — Progress Notes (Signed)
   VASCULAR SURGERY ASSESSMENT & PLAN:   POD 1 REDO RIGHT CEA: The patient is doing well.  He states that he is swallowing okay.  Of note the dissection was very high and also very low.  He has been ambulating and has no focal weakness or paresthesias.  He is on aspirin, Plavix, and a statin.  He will follow-up with me in 2 weeks.  Discontinue JP.  He is ready for discharge today.  SUBJECTIVE:   He had a raspy throat but this has improved.  He states that he is not having significant problems swallowing.  PHYSICAL EXAM:   Vitals:   06/28/21 0313 06/28/21 0418 06/28/21 0434 06/28/21 0600  BP: (!) 168/102 (!) 169/97 (!) 164/92 (!) 147/91  Pulse: 100 (!) 102 100 99  Resp: 12 12 10 13   Temp: 98.7 F (37.1 C)     TempSrc: Oral     SpO2: 96% 96% 95% 92%  Weight:      Height:       NEURO: No focal weakness or paresthesias.  Tongue is midline. His incision looks fine. His JP drain 30 cc for the last shift.  LABS:   Lab Results  Component Value Date   WBC 18.9 (H) 06/28/2021   HGB 12.7 (L) 06/28/2021   HCT 37.5 (L) 06/28/2021   MCV 91.7 06/28/2021   PLT 259 06/28/2021   Lab Results  Component Value Date   CREATININE 2.01 (H) 06/28/2021   Lab Results  Component Value Date   INR 1.0 06/23/2021   CBG (last 3)  Recent Labs    06/27/21 1554 06/27/21 2105 06/28/21 0622  GLUCAP 161* 181* 136*    PROBLEM LIST:    Active Problems:   Carotid stenosis   Carotid artery stenosis   CURRENT MEDS:    aspirin EC  81 mg Oral Daily   atorvastatin  80 mg Oral Daily   carvedilol  25 mg Oral BID   clopidogrel  75 mg Oral Q breakfast   docusate sodium  100 mg Oral Daily   empagliflozin  10 mg Oral QAC breakfast   ezetimibe  10 mg Oral Daily   heparin  5,000 Units Subcutaneous Q8H   insulin aspart  0-9 Units Subcutaneous TID WC   pantoprazole  40 mg Oral Daily   sacubitril-valsartan  1 tablet Oral BID    [name] Office: [phone] 06/28/2021

## 2021-06-28 NOTE — Progress Notes (Signed)
[facility]  Subjective  - No new complaints other than soreness at incision.   Objective (!) 147/91 99 98.7 F (37.1 C) (Oral) 13 92%  Intake/Output Summary (Last 24 hours) at 06/28/2021 3545 Last data filed at 06/28/2021 0208 Gross per 24 hour  Intake 1600 ml  Output 1410 ml  Net 190 ml    Right neck incision healing well without hematoma No tongue deviation or facial droop Grip 5/5 no weakness noted Drain output 30 cc last shift plan to D/C   Assessment/Planning: POD # 1 redo right CEA for asymptomatic stenosis  No neurologic deficits D/C drain, plan for D/c today f/u in 2-3 weeks for incision check Stable disposition  Martin Archer 06/28/2021 7:17 AM --  Laboratory Lab Results: Recent Labs    06/27/21 2156 06/28/21 0430  WBC 16.5* 18.9*  HGB 12.8* 12.7*  HCT 37.7* 37.5*  PLT 233 259   BMET Recent Labs    06/27/21 2156 06/28/21 0430  NA  --  136  K  --  4.5  CL  --  106  CO2  --  22  GLUCOSE  --  129*  BUN  --  37*  CREATININE 2.24* 2.01*  CALCIUM  --  8.3*    COAG Lab Results  Component Value Date   INR 1.0 06/23/2021   INR 1.07 09/18/2015   INR 1.07 01/17/2015   No results found for: PTT

## 2021-06-28 NOTE — Progress Notes (Signed)
Patients ride is on the way and will be here in 15 minutes.

## 2021-06-28 NOTE — Discharge Instructions (Signed)
   [facility] [place]  Discharge Instructions   Carotid Endarterectomy (CEA)  Please refer to the following instructions for your post-procedure care. Your surgeon or physician assistant will discuss any changes with you.  Activity  You are encouraged to walk as much as you can. You can slowly return to normal activities but must avoid strenuous activity and heavy lifting until your doctor tell you it's OK. Avoid activities such as vacuuming or swinging a golf club. You can drive after one week if you are comfortable and you are no longer taking prescription pain medications. It is normal to feel tired for serval weeks after your surgery. It is also normal to have difficulty with sleep habits, eating, and bowel movements after surgery. These will go away with time.  Bathing/Showering  You may shower after you come home. Do not soak in a bathtub, hot tub, or swim until the incision heals completely.  Incision Care  Shower every day. Clean your incision with mild soap and water. Pat the area dry with a clean towel. You do not need a bandage unless otherwise instructed. Do not apply any ointments or creams to your incision. You may have skin glue on your incision. Do not peel it off. It will come off on its own in about one week. Your incision may feel thickened and raised for several weeks after your surgery. This is normal and the skin will soften over time. For Men Only: It's OK to shave around the incision but do not shave the incision itself for 2 weeks. It is common to have numbness under your chin that could last for several months.  Diet  Resume your normal diet. There are no special food restrictions following this procedure. A low fat/low cholesterol diet is recommended for all patients with vascular disease. In order to heal from your surgery, it is CRITICAL to get adequate nutrition. Your body requires vitamins, minerals, and protein. Vegetables are the best  source of vitamins and minerals. Vegetables also provide the perfect balance of protein. Processed food has little nutritional value, so try to avoid this.        Medications  Resume taking all of your medications unless your doctor or physician assistant tells you not to. If your incision is causing pain, you may take over-the- counter pain relievers such as acetaminophen (Tylenol). If you were prescribed a stronger pain medication, please be aware these medications can cause nausea and constipation. Prevent nausea by taking the medication with a snack or meal. Avoid constipation by drinking plenty of fluids and eating foods with a high amount of fiber, such as fruits, vegetables, and grains. Do not take Tylenol if you are taking prescription pain medications.  Follow Up  Our office will schedule a follow up appointment 2-3 weeks following discharge.  Please call us immediately for any of the following conditions  Increased pain, redness, drainage (pus) from your incision site. Fever of 101 degrees or higher. If you should develop stroke (slurred speech, difficulty swallowing, weakness on one side of your body, loss of vision) you should call 911 and go to the nearest emergency room.  Reduce your risk of vascular disease:  Stop smoking. If you would like help call [facility] at 1-800-QUIT-NOW (1-800-784-8669) or [facility] at 336-586-4000. Manage your cholesterol Maintain a desired weight Control your diabetes Keep your blood pressure down  If you have any questions, please call the office at [phone].   

## 2021-06-28 NOTE — Plan of Care (Signed)
  Problem: Education: Goal: Understanding of CV disease, CV risk reduction, and recovery process will improve Outcome: Adequate for Discharge Goal: Individualized Educational Video(s) Outcome: Adequate for Discharge   Problem: Activity: Goal: Ability to return to baseline activity level will improve Outcome: Adequate for Discharge   Problem: Cardiovascular: Goal: Ability to achieve and maintain adequate cardiovascular perfusion will improve Outcome: Adequate for Discharge Goal: Vascular access site(s) Level 0-1 will be maintained Outcome: Adequate for Discharge   Problem: Health Behavior/Discharge Planning: Goal: Ability to safely manage health-related needs after discharge will improve Outcome: Adequate for Discharge   Problem: Education: Goal: Understanding of CV disease, CV risk reduction, and recovery process will improve Outcome: Adequate for Discharge Goal: Individualized Educational Video(s) Outcome: Adequate for Discharge   Problem: Activity: Goal: Ability to return to baseline activity level will improve Outcome: Adequate for Discharge   Problem: Cardiovascular: Goal: Ability to achieve and maintain adequate cardiovascular perfusion will improve Outcome: Adequate for Discharge Goal: Vascular access site(s) Level 0-1 will be maintained Outcome: Adequate for Discharge   Problem: Health Behavior/Discharge Planning: Goal: Ability to safely manage health-related needs after discharge will improve Outcome: Adequate for Discharge

## 2021-07-03 NOTE — Discharge Summary (Signed)
[facility] Discharge Summary   Patient ID:  Martin Archer MRN: [id number] DOB/AGE: [birth year] 60 y.o.  Admit date: 06/27/2021 Discharge date: 07/03/2021 Date of Surgery: 06/27/2021 Surgeon: Surgeon(s): Angelia Mould, MD  Admission Diagnosis: Carotid artery stenosis [I65.29] Carotid stenosis [I65.29]  Discharge Diagnoses:  Carotid artery stenosis [I65.29] Carotid stenosis [I65.29]  Secondary Diagnoses: Past Medical History:  Diagnosis Date   Anemia, unspecified    Arthritis    Diverticulosis of colon (without mention of hemorrhage)    Gastric ulcer with hemorrhage and obstruction 2011   Dr [facility]   GERD (gastroesophageal reflux disease)    hx   Hyperlipemia    Hypertension    Personal history of colonic polyps 01/30/2010   hyperplastic rectal   Sleep apnea    on CPAP; [facility]   Stroke [facility])    mini strokes-bilateral carotid endarterectomy   Substance abuse ([place])    History of alcohol abuse    Procedure(s): RIGHT REDO CAROTID ARTERY ENDARTERECTOMY & PLACEMENT OF 15Fr DRAIN PATCH ANGIOPLASTY USING XENOSURE BIOLOGIC PATCH  Discharged Condition: good  HPI: 60 y/o male with greater than 80% recurrent right carotid stenosis.  He had undergone previous bilateral carotid endarterectomies by Dr. Kellie Simmering.  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  The right carotid endarterectomy was done in 2016.    Hospital Course:  Martin Archer is a 60 y.o. male is S/P Right Procedure(s): RIGHT REDO CAROTID ARTERY ENDARTERECTOMY & PLACEMENT OF 15Fr DRAIN PATCH ANGIOPLASTY USING [place] was uneventful.  He has no neurologic deficits, ambulating, and tolerating PO's.  He had a JP drain placed that was discontinued POD# 1.  He is on aspirin, Plavix, and a statin.  F/U arranged in 2 weeks.   Significant Diagnostic Studies: CBC Lab Results  Component Value Date   WBC 18.9 (H) 06/28/2021   HGB 12.7  (L) 06/28/2021   HCT 37.5 (L) 06/28/2021   MCV 91.7 06/28/2021   PLT 259 06/28/2021    BMET    Component Value Date/Time   NA 136 06/28/2021 0430   NA 139 04/13/2021 1018   K 4.5 06/28/2021 0430   CL 106 06/28/2021 0430   CO2 22 06/28/2021 0430   GLUCOSE 129 (H) 06/28/2021 0430   BUN 37 (H) 06/28/2021 0430   BUN 22 04/13/2021 1018   CREATININE 2.01 (H) 06/28/2021 0430   CREATININE 1.55 (H) 03/23/2020 1600   CALCIUM 8.3 (L) 06/28/2021 0430   GFRNONAA 37 (L) 06/28/2021 0430   GFRNONAA 48 (L) 03/23/2020 1600   GFRAA 55 (L) 03/23/2020 1600   COAG Lab Results  Component Value Date   INR 1.0 06/23/2021   INR 1.07 09/18/2015   INR 1.07 01/17/2015     Disposition:  Discharge to :Home Discharge Instructions     Call MD for:  redness, tenderness, or signs of infection (pain, swelling, bleeding, redness, odor or green/yellow discharge around incision site)   Complete by: As directed    Call MD for:  severe or increased pain, loss or decreased feeling  in affected limb(s)   Complete by: As directed    Call MD for:  temperature >100.5   Complete by: As directed    Resume previous diet   Complete by: As directed       Allergies as of 06/28/2021       Reactions   Adderall [amphetamine-dextroamphet Er] Diarrhea   Prozac [fluoxetine Hcl] Diarrhea   Sertraline Hcl Diarrhea  Advil [ibuprofen] Diarrhea        Medication List     STOP taking these medications    acetaminophen 500 MG tablet Commonly known as: TYLENOL       TAKE these medications    aspirin EC 81 MG tablet Take 1 tablet (81 mg total) by mouth daily. Swallow whole.   atorvastatin 80 MG tablet Commonly known as: LIPITOR TAKE 1 TABLET BY MOUTH DAILY   carvedilol 25 MG tablet Commonly known as: COREG Take 1 tablet (25 mg total) by mouth 2 (two) times daily.   clopidogrel 75 MG tablet Commonly known as: PLAVIX TAKE 1 TABLET BY MOUTH DAILY WITH BREAKFAST.   doxylamine (Sleep) 25 MG  tablet Commonly known as: UNISOM Take 25 mg by mouth at bedtime as needed for sleep.   empagliflozin 10 MG Tabs tablet Commonly known as: Jardiance Take 1 tablet (10 mg total) by mouth daily before breakfast.   Entresto 97-103 MG Generic drug: sacubitril-valsartan Take 1 tablet by mouth 2 (two) times daily.   ezetimibe 10 MG tablet Commonly known as: ZETIA Take 1 tablet (10 mg total) by mouth daily.   furosemide 20 MG tablet Commonly known as: LASIX Take 1 tablet (20 mg total) by mouth daily as needed. For lower extremity swelling What changed:  reasons to take this additional instructions   LUBRICATING EYE DROPS OP Place 1 drop into both eyes daily as needed (dry eyes).   multivitamin with minerals Tabs tablet Take 1 tablet by mouth daily.   oxyCODONE-acetaminophen 5-325 MG tablet Commonly known as: PERCOCET/ROXICET Take 1 tablet by mouth every 6 (six) hours as needed for moderate pain.       Verbal and written Discharge instructions given to the patient. Wound care per Discharge AVS  Follow-up Information     Angelia Mould, MD Follow up in 2 week(s).   Specialties: Vascular Surgery, Cardiology Why: Office will call you to arrange your appt (sent) Contact information: [place] [address] [phone]                 Signed: Roxy Horseman 07/03/2021, 4:31 PM  --- For VQI Registry use --- Instructions: Press F2 to tab through selections.  Delete question if not applicable.   Modified Rankin score at D/C (0-6): Rankin Score=0  IV medication needed for:  1. Hypertension: No 2. Hypotension: No  Post-op Complications: No  1. Post-op CVA or TIA: No  If yes: Event classification (right eye, left eye, right cortical, left cortical, verterobasilar, other):   If yes: Timing of event (intra-op, <6 hrs post-op, >=6 hrs post-op, unknown):   2. CN injury: No  If yes: CN  injuried   3. Myocardial infarction: No  If yes:  Dx by (EKG or clinical, Troponin):   4.  CHF: No  5.  Dysrhythmia (new): No  6. Wound infection: No  7. Reperfusion symptoms: No  8. Return to OR: No  If yes: return to OR for (bleeding, neurologic, other CEA incision, other):   Discharge medications: Statin use:  Yes ASA use:  Yes Beta blocker use:  Yes ACE-Inhibitor use:  No  for medical reason not indicated P2Y12 Antagonist use: [ ]  None, [x ] Plavix, [ ]  Plasugrel, [ ]  Ticlopinine, [ ]  Ticagrelor, [ ]  Other, [ ]  No for medical reason, [ ]  Non-compliant, [ ]  Not-indicated Anti-coagulant use:  [ ]  None, [ ]  Warfarin, [ ]  Rivaroxaban, [ ]  Dabigatran, [ ]  Other, [ ]   No for medical reason, [ ]  Non-compliant, [ ]  Not-indicated

## 2021-07-04 ENCOUNTER — Telehealth: Payer: Self-pay

## 2021-07-04 NOTE — Telephone Encounter (Signed)
Transition Care Management Unsuccessful Follow-up Telephone Call  Date of discharge and from where:  06/28/21 from [facility]  Attempts:  1st Attempt  Reason for unsuccessful TCM follow-up call:  Left voice message    Thea Silversmith, RN, MSN, BSN, [place] Care Management Coordinator [phone]

## 2021-07-10 ENCOUNTER — Telehealth: Payer: Self-pay

## 2021-07-10 NOTE — Telephone Encounter (Signed)
Transition Care Management Unsuccessful Follow-up Telephone Call  Date of discharge and from where:  06/28/21 from [facility]  Attempts:  2nd Attempt  Reason for unsuccessful TCM follow-up call:  Left voice message    Thea Silversmith, RN, MSN, BSN, CCM Care Management Coordinator [phone]

## 2021-07-13 ENCOUNTER — Encounter: Payer: Self-pay | Admitting: Vascular Surgery

## 2021-07-13 ENCOUNTER — Other Ambulatory Visit: Payer: Self-pay

## 2021-07-13 ENCOUNTER — Ambulatory Visit (INDEPENDENT_AMBULATORY_CARE_PROVIDER_SITE_OTHER): Payer: Medicare Other | Admitting: Vascular Surgery

## 2021-07-13 VITALS — BP 164/92 | HR 80 | Temp 97.9°F | Resp 20 | Ht 68.0 in | Wt 209.0 lb

## 2021-07-13 DIAGNOSIS — I6523 Occlusion and stenosis of bilateral carotid arteries: Secondary | ICD-10-CM

## 2021-07-13 NOTE — Progress Notes (Signed)
Patient name: Martin Archer MRN: [id number] DOB: 1961-07-02 Sex: male  REASON FOR VISIT:   Follow-up after redo right carotid endarterectomy.  HPI:   Martin Archer is a pleasant 60 y.o. male who was previously undergone bilateral carotid endarterectomies by Dr. Kellie Archer.  He has a known chronic occlusion on the left and presented with a greater than 80% right carotid stenosis.  Right carotid endarterectomy was recommended in order to lower his risk of future stroke.  He had significantly elevated creatinine and for this reason I felt he was not a candidate for a TCAR as we could not get a CT angiogram.  He underwent redo right carotid endarterectomy on 06/27/2021.  Of note, the stenosis extended very low and also very high.  Fortunately he did not have any swallowing problems postoperatively.  He was discharged on aspirin, Plavix, and a statin.  He was discharged on postoperative day #1.  He returns for his first outpatient visit.   He has no specific complaints.  His swallowing is normal and he has no focal weakness or paresthesias.  Current Outpatient Medications  Medication Sig Dispense Refill   aspirin EC 81 MG tablet Take 1 tablet (81 mg total) by mouth daily. Swallow whole. 30 tablet 11   atorvastatin (LIPITOR) 80 MG tablet TAKE 1 TABLET BY MOUTH DAILY 90 tablet 3   Carboxymethylcellul-Glycerin (LUBRICATING EYE DROPS OP) Place 1 drop into both eyes daily as needed (dry eyes).     carvedilol (COREG) 25 MG tablet Take 1 tablet (25 mg total) by mouth 2 (two) times daily. 180 tablet 3   clopidogrel (PLAVIX) 75 MG tablet TAKE 1 TABLET BY MOUTH DAILY WITH BREAKFAST. 90 tablet 3   doxylamine, Sleep, (UNISOM) 25 MG tablet Take 25 mg by mouth at bedtime as needed for sleep.     empagliflozin (JARDIANCE) 10 MG TABS tablet Take 1 tablet (10 mg total) by mouth daily before breakfast. 90 tablet 3   ezetimibe (ZETIA) 10 MG tablet Take 1 tablet (10 mg total) by mouth daily. 90 tablet 3    furosemide (LASIX) 20 MG tablet Take 1 tablet (20 mg total) by mouth daily as needed. For lower extremity swelling (Patient taking differently: Take 20 mg by mouth daily as needed for fluid or edema.) 90 tablet 3   Multiple Vitamin (MULTIVITAMIN WITH MINERALS) TABS tablet Take 1 tablet by mouth daily.     oxyCODONE-acetaminophen (PERCOCET/ROXICET) 5-325 MG tablet Take 1 tablet by mouth every 6 (six) hours as needed for moderate pain. 20 tablet 0   sacubitril-valsartan (ENTRESTO) 97-103 MG Take 1 tablet by mouth 2 (two) times daily. 180 tablet 3   No current facility-administered medications for this visit.    REVIEW OF SYSTEMS:  [X]  denotes positive finding, [ ]  denotes negative finding Vascular    Leg swelling    Cardiac    Chest pain or chest pressure:    Shortness of breath upon exertion:    Short of breath when lying flat:    Irregular heart rhythm:    Constitutional    Fever or chills:     PHYSICAL EXAM:   Vitals:   07/13/21 1343 07/13/21 1345  BP: (!) 158/95 (!) 164/92  Pulse: 80   Resp: 20   Temp: 97.9 F (36.6 C)   SpO2: 96%   Weight: 209 lb (94.8 kg)   Height: 5\' 8"  (1.727 m)     GENERAL: The patient is a well-nourished male, in no acute distress. The  vital signs are documented above. CARDIOVASCULAR: There is a regular rate and rhythm. PULMONARY: There is good air exchange bilaterally without wheezing or rales. His right neck incision is healing nicely. NEURO: He has no focal weakness or paresthesias.  DATA:   No new data  MEDICAL ISSUES:   S/P REDO RIGHT CAROTID ENDARTERECTOMY: The patient is doing well status post redo right carotid endarterectomy.  He is on aspirin, Plavix, and a statin.  I have ordered a follow-up carotid duplex scan in 9 months and I will see him back at that time.  He knows to call sooner if he has problems.  Of note Dr. Fletcher Archer is following his peripheral vascular disease.  Martin Archer [facility]  [place] [phone]

## 2021-07-17 ENCOUNTER — Other Ambulatory Visit: Payer: Self-pay

## 2021-07-17 DIAGNOSIS — I6523 Occlusion and stenosis of bilateral carotid arteries: Secondary | ICD-10-CM

## 2021-08-08 ENCOUNTER — Encounter: Payer: Self-pay | Admitting: Cardiovascular Disease

## 2021-08-08 ENCOUNTER — Other Ambulatory Visit: Payer: Self-pay

## 2021-08-08 ENCOUNTER — Ambulatory Visit (INDEPENDENT_AMBULATORY_CARE_PROVIDER_SITE_OTHER): Payer: Medicare Other | Admitting: Cardiovascular Disease

## 2021-08-08 VITALS — BP 164/100 | HR 70 | Ht 68.0 in | Wt 205.8 lb

## 2021-08-08 DIAGNOSIS — I739 Peripheral vascular disease, unspecified: Secondary | ICD-10-CM | POA: Diagnosis not present

## 2021-08-08 DIAGNOSIS — I771 Stricture of artery: Secondary | ICD-10-CM | POA: Diagnosis not present

## 2021-08-08 DIAGNOSIS — I2510 Atherosclerotic heart disease of native coronary artery without angina pectoris: Secondary | ICD-10-CM | POA: Diagnosis not present

## 2021-08-08 DIAGNOSIS — I5022 Chronic systolic (congestive) heart failure: Secondary | ICD-10-CM

## 2021-08-08 DIAGNOSIS — I6523 Occlusion and stenosis of bilateral carotid arteries: Secondary | ICD-10-CM

## 2021-08-08 NOTE — Progress Notes (Signed)
Cardiology Office Note   Date:  08/08/2021   ID:  Martin Archer, DOB September 08, 1960, MRN [id number]  PCP:  Biagio Borg, MD  Cardiologist: Dr. Irish Lack  No chief complaint on file.     History of Present Illness: Martin Archer is a 60 y.o. male who is here today for follow-up visit regarding left subclavian artery stenosis status post stent placement.   He has known history of essential hypertension, previous tobacco use, carotid artery disease status post bilateral endarterectomy, chronic systolic heart failure, history of anemia due to GI bleed and chronic kidney disease. He is not diabetic and quit smoking in 2021. He had an echocardiogram done in April which showed an ejection fraction of 40 to 45% with global hypokinesis, severely dilated atria, mild mitral regurgitation mildly calcified aortic valve.  He underwent a right and left cardiac catheterization in June of this year which showed chronically occluded proximal right coronary artery with left-to-right collaterals and mild nonobstructive disease on the left side.  Medical therapy was recommended.  Carotid Doppler done in May showed an occluded left carotid artery with moderate disease on the right side.  Significant stenosis noted in the left subclavian artery with 37 mm of pressure difference between the arms and retrograde flow in the left vertebral artery.  The patient had highly symptomatic left arm claudication.  I proceeded with angiography in August which showed severe left subclavian artery stenosis.  I performed successful VBX covered stent placement to the left subclavian artery.  In addition, he was noted to have flush occlusion of the right SFA.Marland Kitchen  Lower extremity Dopplers in August showed an ABI of 0.62 on the right and 0.84 on the left.  Duplex showed borderline stenosis in the right external iliac artery with occluded right SFA.  On the left, there was moderate SFA disease.  Upon follow-up carotid  Doppler, the patient was noted to have patent left subclavian artery stent with chronically occluded left carotid artery and progression of right carotid stenosis to greater than 80%.  The patient underwent redo carotid endarterectomy in November by Dr. Scot Dock.  He has been doing very well and denies recurrent left arm claudication.  In addition, he reports improvement in calf claudication.  He feels well with no chest pain or shortness of breath and he resumed working.   Past Medical History:  Diagnosis Date   Anemia, unspecified    Arthritis    Diverticulosis of colon (without mention of hemorrhage)    Gastric ulcer with hemorrhage and obstruction 2011   Dr F[facility]   GERD (gastroesophageal reflux disease)    hx   Hyperlipemia    Hypertension    Personal history of colonic polyps 01/30/2010   hyperplastic rectal   Sleep apnea    on CPAP; [facility]   Stroke [facility])    mini strokes-bilateral carotid endarterectomy   Substance abuse ([place])    History of alcohol abuse    Past Surgical History:  Procedure Laterality Date   AORTIC ARCH ANGIOGRAPHY N/A 04/26/2021   Procedure: AORTIC ARCH ANGIOGRAPHY;  Surgeon: Wellington Hampshire, MD;  Location: [place] [facility];  Service: Cardiovascular;  Laterality: N/A;   CARDIAC CATHETERIZATION  01/25/2021   CAROTID ENDARTERECTOMY  11/2006   left Dr Mamie Nick   COLONOSCOPY  2011   DP-positive FOBT/IDA   ENDARTERECTOMY Right 01/19/2015   Procedure: RIGHT CAROTID ENDARTERECTOMY WITH PATCH ANGIOPLASTY;  Surgeon: Mal Misty, MD;  Location: [place];  Service: Vascular;  Laterality: Right;   ENDARTERECTOMY Right 06/27/2021   Procedure: RIGHT REDO CAROTID ARTERY ENDARTERECTOMY & PLACEMENT OF 15Fr DRAIN;  Surgeon: Angelia Mould, MD;  Location: [place];  Service: Vascular;  Laterality: Right;   ESOPHAGOGASTRODUODENOSCOPY Left 06/17/2013   Procedure: ESOPHAGOGASTRODUODENOSCOPY (EGD);  Surgeon: Arta Silence, MD;   Location: [facility] ENDOSCOPY;  Service: Endoscopy;  Laterality: Left;   ESOPHAGOGASTRODUODENOSCOPY N/A 09/20/2015   Procedure: ESOPHAGOGASTRODUODENOSCOPY (EGD);  Surgeon: Ladene Artist, MD;  Location: [facility] ENDOSCOPY;  Service: Endoscopy;  Laterality: N/A;   FOOT SURGERY Bilateral 2020   Taylor's foot   INGUINAL HERNIA REPAIR  1963   NASAL FRACTURE SURGERY  1974   PATCH ANGIOPLASTY Right 06/27/2021   Procedure: PATCH ANGIOPLASTY USING Rueben Bash BIOLOGIC PATCH;  Surgeon: Angelia Mould, MD;  Location: [place];  Service: Vascular;  Laterality: Right;   PERIPHERAL VASCULAR INTERVENTION  04/26/2021   Procedure: PERIPHERAL VASCULAR INTERVENTION;  Surgeon: Wellington Hampshire, MD;  Location: [place] [facility];  Service: Cardiovascular;;   RIGHT/LEFT HEART CATH AND CORONARY ANGIOGRAPHY N/A 01/25/2021   Procedure: RIGHT/LEFT HEART CATH AND CORONARY ANGIOGRAPHY;  Surgeon: Jettie Booze, MD;  Location: [place] [facility];  Service: Cardiovascular;  Laterality: N/A;   TEE WITHOUT CARDIOVERSION N/A 01/17/2015   Procedure: TRANSESOPHAGEAL ECHOCARDIOGRAM (TEE);  Surgeon: Sanda Klein, MD;  Location: [facility] ENDOSCOPY;  Service: Cardiovascular;  Laterality: N/A;   WISDOM TOOTH EXTRACTION       Current Outpatient Medications  Medication Sig Dispense Refill   aspirin EC 81 MG tablet Take 1 tablet (81 mg total) by mouth daily. Swallow whole. 30 tablet 11   atorvastatin (LIPITOR) 80 MG tablet TAKE 1 TABLET BY MOUTH DAILY 90 tablet 3   Carboxymethylcellul-Glycerin (LUBRICATING EYE DROPS OP) Place 1 drop into both eyes daily as needed (dry eyes).     carvedilol (COREG) 25 MG tablet Take 1 tablet (25 mg total) by mouth 2 (two) times daily. 180 tablet 3   clopidogrel (PLAVIX) 75 MG tablet TAKE 1 TABLET BY MOUTH DAILY WITH BREAKFAST. 90 tablet 3   doxylamine, Sleep, (UNISOM) 25 MG tablet Take 25 mg by mouth at bedtime as needed for sleep.     empagliflozin (JARDIANCE) 10 MG TABS tablet Take 1 tablet (10 mg total)  by mouth daily before breakfast. 90 tablet 3   ezetimibe (ZETIA) 10 MG tablet Take 1 tablet (10 mg total) by mouth daily. 90 tablet 3   furosemide (LASIX) 20 MG tablet Take 1 tablet (20 mg total) by mouth daily as needed. For lower extremity swelling (Patient taking differently: Take 20 mg by mouth daily as needed for fluid or edema.) 90 tablet 3   Multiple Vitamin (MULTIVITAMIN WITH MINERALS) TABS tablet Take 1 tablet by mouth daily.     oxyCODONE-acetaminophen (PERCOCET/ROXICET) 5-325 MG tablet Take 1 tablet by mouth every 6 (six) hours as needed for moderate pain. 20 tablet 0   sacubitril-valsartan (ENTRESTO) 97-103 MG Take 1 tablet by mouth 2 (two) times daily. 180 tablet 3   No current facility-administered medications for this visit.    Allergies:   Adderall [amphetamine-dextroamphet er], Prozac [fluoxetine hcl], Sertraline hcl, and Advil [ibuprofen]    Social History:  The patient  reports that he quit smoking about 6 years ago. His smoking use included cigarettes. He smoked an average of .5 packs per day. He has never used smokeless tobacco. He reports that he does not currently use alcohol. He reports that he does not use drugs.  Family History:  The patient's family history includes Cancer in his maternal grandfather; Diabetes in his father and paternal grandfather; Heart attack in his mother; Heart attack (age of onset: 47) in his brother; Heart attack (age of onset: 70) in his maternal grandfather; Hypertension in his father; Stroke in his father and paternal uncle.    ROS:  Please see the history of present illness.   Otherwise, review of systems are positive for none.   All other systems are reviewed and negative.    PHYSICAL EXAM: VS:  BP (!) 164/100    Pulse 70    Ht 5\' 8"  (1.727 m)    Wt 205 lb 12.8 oz (93.4 kg)    SpO2 97%    BMI 31.29 kg/m  , BMI Body mass index is 31.29 kg/m. GEN: Well nourished, well developed, in no acute distress  HEENT: normal  Neck: no JVD,  carotid bruits, or masses Cardiac: RRR; no murmurs, rubs, or gallops,no edema  Respiratory:  clear to auscultation bilaterally, normal work of breathing GI: soft, nontender, nondistended, + BS MS: no deformity or atrophy  Skin: warm and dry, no rash Neuro:  Strength and sensation are intact Psych: euthymic mood, full affect   EKG:  EKG is not ordered today.   Recent Labs: 12/19/2020: TSH 1.76 06/23/2021: ALT 24 06/28/2021: BUN 37; Creatinine, Ser 2.01; Hemoglobin 12.7; Platelets 259; Potassium 4.5; Sodium 136    Lipid Panel    Component Value Date/Time   CHOL 122 06/28/2021 0430   CHOL 124 04/13/2021 1018   CHOL 286 (H) 10/12/2013 0436   TRIG 114 06/28/2021 0430   TRIG 572 (H) 10/12/2013 0436   TRIG 284 (HH) 07/15/2006 1158   HDL 37 (L) 06/28/2021 0430   HDL 35 (L) 04/13/2021 1018   HDL 35 (L) 10/12/2013 0436   CHOLHDL 3.3 06/28/2021 0430   VLDL 23 06/28/2021 0430   LDLCALC 62 06/28/2021 0430   LDLCALC 59 04/13/2021 1018   LDLCALC  03/23/2020 1601     Comment:     . LDL cholesterol not calculated. Triglyceride levels greater than 400 mg/dL invalidate calculated LDL results. . Reference range: <100 . Desirable range <100 mg/dL for primary prevention;   <70 mg/dL for patients with CHD or diabetic patients  with > or = 2 CHD risk factors. Marland Kitchen LDL-C is now calculated using the Martin-Hopkins  calculation, which is a validated novel method providing  better accuracy than the Friedewald equation in the  estimation of LDL-C.  Cresenciano Genre et al. Annamaria Helling. 6546;503(54): 2061-2068  (http://education.[employer].com/faq/FAQ164)    LDLCALC NOT CALC 10/12/2013 0436   LDLDIRECT 90.0 09/26/2017 1715      Wt Readings from Last 3 Encounters:  08/08/21 205 lb 12.8 oz (93.4 kg)  07/13/21 209 lb (94.8 kg)  06/27/21 206 lb 2.1 oz (93.5 kg)       No flowsheet data found.    ASSESSMENT AND PLAN:  1.  Left subclavian artery stenosis: Status post successful covered stent  placement with resolution of left arm claudication.  The stent was patent by recent Doppler.  Given his extensive cardiovascular disease, I am going to keep him on long-term dual antiplatelet therapy.  2.  Carotid artery stenosis: Occluded left carotid artery.  Status post recent redo right carotid endarterectomy.    3.  Coronary artery disease involving native coronary arteries without angina: He has no anginal symptoms.  Continue medical therapy.    4.  Chronic systolic heart failure: He appears to  be euvolemic and currently on carvedilol, Entresto and Jardiance.  5.  Peripheral arterial disease: The patient has evidence of bilateral leg claudication worse on the right side.  His right SFA is occluded.  His claudication is currently not lifestyle limiting and thus I do not recommend revascularization especially with chronically occluded SFA.  I discussed with him the importance of starting a walking exercise program and provided him with instructions.  6.  Hyperlipidemia: Continue high-dose atorvastatin and Zetia.  I reviewed most recent lipid profile which showed an LDL of 58.    Disposition:   FU with me in 12 months  Signed,  Kathlyn Sacramento, MD  08/08/2021 9:43 AM    [place]

## 2021-08-08 NOTE — Patient Instructions (Signed)
Medication Instructions:  No changes *If you need a refill on your cardiac medications before your next appointment, please call your pharmacy*   Lab Work: None ordered If you have labs (blood work) drawn today and your tests are completely normal, you will receive your results only by: [facility] (if you have MyChart) OR A paper copy in the mail If you have any lab test that is abnormal or we need to change your treatment, we will call you to review the results.   Testing/Procedures: None ordered   Follow-Up: At [facility], you and your health needs are our priority.  As part of our continuing mission to provide you with exceptional heart care, we have created designated Provider Care Teams.  These Care Teams include your primary Cardiologist (physician) and Advanced Practice Providers (APPs -  Physician Assistants and Nurse Practitioners) who all work together to provide you with the care you need, when you need it.  We recommend signing up for the patient portal called "MyChart".  Sign up information is provided on this After Visit Summary.  MyChart is used to connect with patients for Virtual Visits (Telemedicine).  Patients are able to view lab/test results, encounter notes, upcoming appointments, etc.  Non-urgent messages can be sent to your provider as well.   To learn more about what you can do with MyChart, go to [email or web address].    Your next appointment:   12 month(s)  The format for your next appointment:   In Person  Provider:   Kathlyn Sacramento, MD  Other Instructions EXERCISE PROGRAM FOR INDIVIDUALS WITH  PERIPHERAL ARTERIAL DISEASE (PAD)   General Information:   Research in vascular exercise has demonstrated remarkable improvement in symptoms of leg pain (claudication) without expensive or invasive interventions. Regular walking programs are extremely helpful for patients with PAD and intermittent claudication.  These steps are designed to help you  get started with a safe and effective program to help you walk farther with less pain:   Walk at least three times a week (preferably every day).  Your goal is to build up to 30-45 minutes of total walking time (not counting rest breaks). It may take you several weeks to build up your exercise time starting at 5-10 minutes or whatever you can tolerate.  Walk as far as possible using moderate to maximal pain (7-8 on the scale below) as a signal to stop, and resume walking when the pain goes away.  On a treadmill, set the speed and grade at a level that brings on the claudication pain within 3 to 5 minutes. Walk at this rate until you experience claudication of moderate severity, rest until the pain improves, and then resume walking.  Over time, you will be able to walk longer at the designated speed and grade; workload should then be increased until you develop the pain within 3 to 5 minutes once again.  This regimen will induce a significant benefit. Studies have demonstrated that participants may be able to walk up to three or four times farther and have less leg pain, within twelve weeks, by following this protocol.  Pain Scale    0_____1_____2_____3_____4_____5_____6_____7_____8_____9_____10   No Pain                                   Moderate Pain  Maximal Pain

## 2021-09-05 ENCOUNTER — Encounter: Payer: Self-pay | Admitting: Internal Medicine

## 2021-09-05 ENCOUNTER — Other Ambulatory Visit: Payer: Self-pay

## 2021-09-05 ENCOUNTER — Ambulatory Visit (INDEPENDENT_AMBULATORY_CARE_PROVIDER_SITE_OTHER): Payer: 59 | Admitting: Internal Medicine

## 2021-09-05 VITALS — BP 126/82 | HR 56 | Resp 18 | Ht 68.0 in | Wt 190.6 lb

## 2021-09-05 DIAGNOSIS — J0110 Acute frontal sinusitis, unspecified: Secondary | ICD-10-CM

## 2021-09-05 DIAGNOSIS — N1832 Chronic kidney disease, stage 3b: Secondary | ICD-10-CM

## 2021-09-05 DIAGNOSIS — R197 Diarrhea, unspecified: Secondary | ICD-10-CM

## 2021-09-05 DIAGNOSIS — J069 Acute upper respiratory infection, unspecified: Secondary | ICD-10-CM | POA: Diagnosis not present

## 2021-09-05 DIAGNOSIS — J329 Chronic sinusitis, unspecified: Secondary | ICD-10-CM | POA: Insufficient documentation

## 2021-09-05 LAB — CBC
HCT: 42.9 % (ref 39.0–52.0)
Hemoglobin: 14.1 g/dL (ref 13.0–17.0)
MCHC: 32.8 g/dL (ref 30.0–36.0)
MCV: 91.1 fl (ref 78.0–100.0)
Platelets: 322 10*3/uL (ref 150.0–400.0)
RBC: 4.71 Mil/uL (ref 4.22–5.81)
RDW: 14.2 % (ref 11.5–15.5)
WBC: 15 10*3/uL — ABNORMAL HIGH (ref 4.0–10.5)

## 2021-09-05 LAB — COMPREHENSIVE METABOLIC PANEL
ALT: 16 U/L (ref 0–53)
AST: 18 U/L (ref 0–37)
Albumin: 4.2 g/dL (ref 3.5–5.2)
Alkaline Phosphatase: 125 U/L — ABNORMAL HIGH (ref 39–117)
BUN: 42 mg/dL — ABNORMAL HIGH (ref 6–23)
CO2: 23 mEq/L (ref 19–32)
Calcium: 9.5 mg/dL (ref 8.4–10.5)
Chloride: 102 mEq/L (ref 96–112)
Creatinine, Ser: 2.59 mg/dL — ABNORMAL HIGH (ref 0.40–1.50)
GFR: 25.72 mL/min — ABNORMAL LOW (ref >60.00)
Glucose, Bld: 89 mg/dL (ref 70–99)
Potassium: 4.9 mEq/L (ref 3.5–5.1)
Sodium: 135 mEq/L (ref 135–145)
Total Bilirubin: 0.5 mg/dL (ref 0.2–1.2)
Total Protein: 7.2 g/dL (ref 6.0–8.3)

## 2021-09-05 MED ORDER — AMOXICILLIN-POT CLAVULANATE 875-125 MG PO TABS: 1.0000 | ORAL_TABLET | Freq: Two times a day (BID) | ORAL | 0 refills | Status: DC

## 2021-09-05 NOTE — Assessment & Plan Note (Signed)
He did take 2 negative home covid-19 tests last about a week ago. Desires PCR testing for confirmation which is ordered and done today.

## 2021-09-05 NOTE — Assessment & Plan Note (Addendum)
Suspect related to viral illness. Is now only after drinking/eating. He is mostly drinking sodas and diet sodas which can exacerbate diarrhea. Given dietary information to help resolve the diarrhea. Given CKD needs CMP today to assess if he is having worsening renal function due to diarrhea and poor appetite.

## 2021-09-05 NOTE — Patient Instructions (Addendum)
We will check the blood work today.  We have sent off a covid-19 test today.  We have sent in augmentin to take 1 pill twice a day for 5 days.

## 2021-09-05 NOTE — Progress Notes (Signed)
° °  Subjective:   Patient ID: Martin Archer, male    DOB: 06-02-1961, 61 y.o.   MRN: [id number]  HPI The patient is a 61 YO man coming in for viral URI symptoms started 08/22/21. Still having cough and diarrhea. Drinking only sodas and diet sodas  Review of Systems  Constitutional:  Positive for activity change, appetite change and fatigue. Negative for chills, fever and unexpected weight change.  HENT:  Positive for congestion, postnasal drip, rhinorrhea and sinus pressure. Negative for ear discharge, ear pain, sinus pain, sneezing, sore throat, tinnitus, trouble swallowing and voice change.   Eyes: Negative.   Respiratory:  Positive for cough. Negative for chest tightness, shortness of breath and wheezing.   Cardiovascular: Negative.   Gastrointestinal:  Positive for diarrhea. Negative for blood in stool and nausea.  Musculoskeletal:  Negative for myalgias.  Neurological: Negative.    Objective:  Physical Exam Constitutional:      Appearance: He is well-developed.  HENT:     Head: Normocephalic and atraumatic.     Comments: Oropharynx with redness and clear drainage, nose with swollen turbinates, TMs normal bilaterally.  Neck:     Thyroid: No thyromegaly.  Cardiovascular:     Rate and Rhythm: Normal rate and regular rhythm.  Pulmonary:     Effort: Pulmonary effort is normal. No respiratory distress.     Breath sounds: Rhonchi present. No wheezing or rales.     Comments: Rhonchi left lung, no coughing during exam and no SOB during exam or with walking 100 feet.  Abdominal:     General: Bowel sounds are normal. There is no distension.     Palpations: Abdomen is soft.     Tenderness: There is no abdominal tenderness. There is no rebound.  Musculoskeletal:        General: No tenderness.     Cervical back: Normal range of motion.  Lymphadenopathy:     Cervical: No cervical adenopathy.  Skin:    General: Skin is warm and dry.  Neurological:     Mental Status: He is alert  and oriented to person, place, and time.     Coordination: Coordination normal.    Vitals:   09/05/21 0932  BP: 126/82  Pulse: (!) 56  Resp: 18  SpO2: 100%  Weight: 190 lb 9.6 oz (86.5 kg)  Height: 5\' 8"  (1.727 m)    This visit occurred during the SARS-CoV-2 public health emergency.  Safety protocols were in place, including screening questions prior to the visit, additional usage of staff PPE, and extensive cleaning of exam room while observing appropriate contact time as indicated for disinfecting solutions.   Assessment & Plan:

## 2021-09-05 NOTE — Assessment & Plan Note (Signed)
Rx augmentin 5 day course as he has some rhonchi left lung on exam and ongoing symptoms 2 weeks.

## 2021-09-05 NOTE — Assessment & Plan Note (Signed)
Needs CMP today at assess renal function given ongoing diarrhea and poor appetite for 2 weeks. Checking CBC as well.

## 2021-09-06 LAB — NOVEL CORONAVIRUS, NAA: SARS-CoV-2, NAA: NOT DETECTED

## 2021-09-06 LAB — SPECIMEN STATUS REPORT

## 2021-09-06 LAB — SARS-COV-2, NAA 2 DAY TAT

## 2021-09-08 ENCOUNTER — Other Ambulatory Visit: Payer: Self-pay | Admitting: Internal Medicine

## 2021-09-08 DIAGNOSIS — N184 Chronic kidney disease, stage 4 (severe): Secondary | ICD-10-CM

## 2021-09-14 ENCOUNTER — Telehealth: Payer: Self-pay

## 2021-09-14 NOTE — Telephone Encounter (Signed)
°  This could be related to a viral illness?  Please check covid again, and ROV if negative if he would want to do this.    Further antibx may not be appropriate (especially the same one) if did not work the first time

## 2021-09-14 NOTE — Telephone Encounter (Signed)
Pt calling in stating that he completed the Rx for amoxicillin-clavulanate (AUGMENTIN) 875-125 MG tablet.  Pt states that he was getting better but now is having nasal drip, congestion, headaches, sinus pressure, sneezing, coughing, and a little fatigue all over a again.  Pt is wondering if there is anything else that he could try. Pt was tested for COVID on 1/13 and test have been neg.   CB [phone]  Pharmacy: [facility], [place]

## 2021-09-15 NOTE — Telephone Encounter (Signed)
Left message for patient to call me back. 

## 2021-09-20 ENCOUNTER — Encounter: Payer: Self-pay | Admitting: Internal Medicine

## 2021-09-20 ENCOUNTER — Ambulatory Visit (INDEPENDENT_AMBULATORY_CARE_PROVIDER_SITE_OTHER): Payer: 59 | Admitting: Internal Medicine

## 2021-09-20 ENCOUNTER — Other Ambulatory Visit: Payer: Self-pay

## 2021-09-20 VITALS — BP 162/100 | HR 85 | Temp 99.4°F | Ht 68.0 in | Wt 190.0 lb

## 2021-09-20 DIAGNOSIS — E785 Hyperlipidemia, unspecified: Secondary | ICD-10-CM

## 2021-09-20 DIAGNOSIS — N320 Bladder-neck obstruction: Secondary | ICD-10-CM

## 2021-09-20 DIAGNOSIS — E538 Deficiency of other specified B group vitamins: Secondary | ICD-10-CM | POA: Diagnosis not present

## 2021-09-20 DIAGNOSIS — J0110 Acute frontal sinusitis, unspecified: Secondary | ICD-10-CM

## 2021-09-20 DIAGNOSIS — Z1211 Encounter for screening for malignant neoplasm of colon: Secondary | ICD-10-CM

## 2021-09-20 DIAGNOSIS — I10 Essential (primary) hypertension: Secondary | ICD-10-CM | POA: Diagnosis not present

## 2021-09-20 DIAGNOSIS — N184 Chronic kidney disease, stage 4 (severe): Secondary | ICD-10-CM

## 2021-09-20 DIAGNOSIS — Z0001 Encounter for general adult medical examination with abnormal findings: Secondary | ICD-10-CM

## 2021-09-20 DIAGNOSIS — E559 Vitamin D deficiency, unspecified: Secondary | ICD-10-CM

## 2021-09-20 DIAGNOSIS — R739 Hyperglycemia, unspecified: Secondary | ICD-10-CM

## 2021-09-20 LAB — URINALYSIS, ROUTINE W REFLEX MICROSCOPIC
Bilirubin Urine: NEGATIVE
Ketones, ur: NEGATIVE
Leukocytes,Ua: NEGATIVE
Nitrite: NEGATIVE
Specific Gravity, Urine: 1.01 (ref 1.000–1.030)
Urine Glucose: >=1000 — AB
Urobilinogen, UA: 0.2 (ref 0.0–1.0)
WBC, UA: NONE SEEN (ref 0–?)
pH: 6 (ref 5.0–8.0)

## 2021-09-20 LAB — CBC WITH DIFFERENTIAL/PLATELET
Basophils Absolute: 0.1 10*3/uL (ref 0.0–0.1)
Basophils Relative: 0.7 % (ref 0.0–3.0)
Eosinophils Absolute: 0.3 10*3/uL (ref 0.0–0.7)
Eosinophils Relative: 2.7 % (ref 0.0–5.0)
HCT: 43.1 % (ref 39.0–52.0)
Hemoglobin: 14.2 g/dL (ref 13.0–17.0)
Lymphocytes Relative: 20.3 % (ref 12.0–46.0)
Lymphs Abs: 1.9 10*3/uL (ref 0.7–4.0)
MCHC: 33 g/dL (ref 30.0–36.0)
MCV: 90.9 fl (ref 78.0–100.0)
Monocytes Absolute: 1.1 10*3/uL — ABNORMAL HIGH (ref 0.1–1.0)
Monocytes Relative: 12.1 % — ABNORMAL HIGH (ref 3.0–12.0)
Neutro Abs: 5.9 10*3/uL (ref 1.4–7.7)
Neutrophils Relative %: 64.2 % (ref 43.0–77.0)
Platelets: 283 10*3/uL (ref 150.0–400.0)
RBC: 4.74 Mil/uL (ref 4.22–5.81)
RDW: 13.9 % (ref 11.5–15.5)
WBC: 9.3 10*3/uL (ref 4.0–10.5)

## 2021-09-20 LAB — HEPATIC FUNCTION PANEL
ALT: 21 U/L (ref 0–53)
AST: 20 U/L (ref 0–37)
Albumin: 4.1 g/dL (ref 3.5–5.2)
Alkaline Phosphatase: 115 U/L (ref 39–117)
Bilirubin, Direct: 0.1 mg/dL (ref 0.0–0.3)
Total Bilirubin: 0.4 mg/dL (ref 0.2–1.2)
Total Protein: 6.6 g/dL (ref 6.0–8.3)

## 2021-09-20 LAB — BASIC METABOLIC PANEL
BUN: 38 mg/dL — ABNORMAL HIGH (ref 6–23)
CO2: 30 mEq/L (ref 19–32)
Calcium: 9.3 mg/dL (ref 8.4–10.5)
Chloride: 103 mEq/L (ref 96–112)
Creatinine, Ser: 2.29 mg/dL — ABNORMAL HIGH (ref 0.40–1.50)
GFR: 29.81 mL/min — ABNORMAL LOW (ref >60.00)
Glucose, Bld: 76 mg/dL (ref 70–99)
Potassium: 5.2 mEq/L — ABNORMAL HIGH (ref 3.5–5.1)
Sodium: 140 mEq/L (ref 135–145)

## 2021-09-20 LAB — LIPID PANEL
Cholesterol: 123 mg/dL (ref 0–200)
HDL: 35.1 mg/dL — ABNORMAL LOW (ref >39.00)
LDL Cholesterol: 58 mg/dL (ref 0–99)
NonHDL: 87.89
Total CHOL/HDL Ratio: 4
Triglycerides: 151 mg/dL — ABNORMAL HIGH (ref 0.0–149.0)
VLDL: 30.2 mg/dL (ref 0.0–40.0)

## 2021-09-20 LAB — VITAMIN D 25 HYDROXY (VIT D DEFICIENCY, FRACTURES): VITD: 31.86 ng/mL (ref 30.00–100.00)

## 2021-09-20 LAB — TSH: TSH: 1.8 u[IU]/mL (ref 0.35–5.50)

## 2021-09-20 LAB — PSA: PSA: 1.19 ng/mL (ref 0.10–4.00)

## 2021-09-20 LAB — HEMOGLOBIN A1C: Hgb A1c MFr Bld: 5.9 % (ref 4.6–6.5)

## 2021-09-20 LAB — VITAMIN B12: Vitamin B-12: 458 pg/mL (ref 211–911)

## 2021-09-20 NOTE — Patient Instructions (Addendum)
You will be contacted regarding the referral for: colonoscopy  Please continue all other medications as before, and refills have been done if requested.  Please have the pharmacy call with any other refills you may need.  Please continue your efforts at being more active, low cholesterol diet, and weight control.  You are otherwise up to date with prevention measures today.  Please keep your appointments with your specialists as you may have planned - nephrology (kidney doctor)  Please go to the LAB at the blood drawing area for the tests to be done  You will be contacted by phone if any changes need to be made immediately.  Otherwise, you will receive a letter about your results with an explanation, but please check with MyChart first.  Please remember to sign up for MyChart if you have not done so, as this will be important to you in the future with finding out test results, communicating by private email, and scheduling acute appointments online when needed.  Please make an Appointment to return in 6 months, or sooner if needed

## 2021-09-20 NOTE — Progress Notes (Signed)
Patient ID: Martin Archer, male   DOB: April 21, 1961, 61 y.o.   MRN: [id number]         Chief Complaint:: wellness exam and Office Visit (Patient is still having some symptoms of sinus infection)  And cough, elevated BP, CKD4, hld, htn       HPI:  Martin Archer is a 61 y.o. male here for wellness exam; due for colonoscopy and labs; declines covid booster and shingrix o/w up to date                        Also recent sinusitis resolved, has some residual mild non prod cough but no fever and Pt denies chest pain, increased sob or doe, wheezing, orthopnea, PND, increased LE swelling, palpitations, dizziness or syncope.  BP at home < 140/90, sees renal on regular basis, trying to follow lower chol diet.   Pt denies polydipsia, polyuria, or new focal neuro s/s.   Pt denies fever, wt loss, night sweats, loss of appetite, or other constitutional symptoms     Wt Readings from Last 3 Encounters:  09/20/21 190 lb (86.2 kg)  09/05/21 190 lb 9.6 oz (86.5 kg)  08/08/21 205 lb 12.8 oz (93.4 kg)   BP Readings from Last 3 Encounters:  09/20/21 (!) 162/100  09/05/21 126/82  08/08/21 (!) 164/100   Immunization History  Administered Date(s) Administered   Influenza Whole 06/28/2010   Influenza,inj,Quad PF,6+ Mos 07/25/2017, 05/20/2019, 05/29/2021   Moderna Sars-Covid-2 Vaccination 12/25/2019, 01/22/2020, 06/20/2020   Pneumococcal Polysaccharide-23 08/01/2017   Td 08/27/2001, 10/02/2013   Health Maintenance Due  Topic Date Due   COLONOSCOPY (Pts 45-59yrs Insurance coverage will need to be confirmed)  01/28/2015      Past Medical History:  Diagnosis Date   Anemia, unspecified    Arthritis    Diverticulosis of colon (without mention of hemorrhage)    Gastric ulcer with hemorrhage and obstruction 2011   Dr [facility]   GERD (gastroesophageal reflux disease)    hx   Hyperlipemia    Hypertension    Personal history of colonic polyps 01/30/2010   hyperplastic rectal   Sleep  apnea    on CPAP; [facility]   Stroke [facility])    mini strokes-bilateral carotid endarterectomy   Substance abuse ([place])    History of alcohol abuse   Past Surgical History:  Procedure Laterality Date   AORTIC ARCH ANGIOGRAPHY N/A 04/26/2021   Procedure: AORTIC ARCH ANGIOGRAPHY;  Surgeon: Wellington Hampshire, MD;  Location: [place] [facility];  Service: Cardiovascular;  Laterality: N/A;   CARDIAC CATHETERIZATION  01/25/2021   CAROTID ENDARTERECTOMY  11/2006   left Dr Mamie Nick   COLONOSCOPY  2011   DP-positive FOBT/IDA   ENDARTERECTOMY Right 01/19/2015   Procedure: RIGHT CAROTID ENDARTERECTOMY WITH PATCH ANGIOPLASTY;  Surgeon: Mal Misty, MD;  Location: [place];  Service: Vascular;  Laterality: Right;   ENDARTERECTOMY Right 06/27/2021   Procedure: RIGHT REDO CAROTID ARTERY ENDARTERECTOMY & PLACEMENT OF 15Fr DRAIN;  Surgeon: Angelia Mould, MD;  Location: [place];  Service: Vascular;  Laterality: Right;   ESOPHAGOGASTRODUODENOSCOPY Left 06/17/2013   Procedure: ESOPHAGOGASTRODUODENOSCOPY (EGD);  Surgeon: Arta Silence, MD;  Location: [facility] ENDOSCOPY;  Service: Endoscopy;  Laterality: Left;   ESOPHAGOGASTRODUODENOSCOPY N/A 09/20/2015   Procedure: ESOPHAGOGASTRODUODENOSCOPY (EGD);  Surgeon: Ladene Artist, MD;  Location: [facility] ENDOSCOPY;  Service: Endoscopy;  Laterality: N/A;   FOOT SURGERY Bilateral 2020   Taylor's foot  [place]   NASAL FRACTURE SURGERY  1974   PATCH ANGIOPLASTY Right 06/27/2021   Procedure: PATCH ANGIOPLASTY USING Rueben Bash BIOLOGIC PATCH;  Surgeon: Angelia Mould, MD;  Location: [place];  Service: Vascular;  Laterality: Right;   PERIPHERAL VASCULAR INTERVENTION  04/26/2021   Procedure: PERIPHERAL VASCULAR INTERVENTION;  Surgeon: Wellington Hampshire, MD;  Location: [place] [facility];  Service: Cardiovascular;;   RIGHT/LEFT HEART CATH AND CORONARY ANGIOGRAPHY N/A 01/25/2021   Procedure: RIGHT/LEFT HEART CATH AND CORONARY ANGIOGRAPHY;   Surgeon: Jettie Booze, MD;  Location: [place] [facility];  Service: Cardiovascular;  Laterality: N/A;   TEE WITHOUT CARDIOVERSION N/A 01/17/2015   Procedure: TRANSESOPHAGEAL ECHOCARDIOGRAM (TEE);  Surgeon: Sanda Klein, MD;  Location: [facility] ENDOSCOPY;  Service: Cardiovascular;  Laterality: N/A;   WISDOM TOOTH EXTRACTION      reports that he quit smoking about 6 years ago. His smoking use included cigarettes. He smoked an average of .5 packs per day. He has never used smokeless tobacco. He reports that he does not currently use alcohol. He reports that he does not use drugs. family history includes Cancer in his maternal grandfather; Diabetes in his father and paternal grandfather; Heart attack in his mother; Heart attack (age of onset: 65) in his brother; Heart attack (age of onset: 51) in his maternal grandfather; Hypertension in his father; Stroke in his father and paternal uncle. Allergies  Allergen Reactions   Adderall [Amphetamine-Dextroamphet Er] Diarrhea   Prozac [Fluoxetine Hcl] Diarrhea   Sertraline Hcl Diarrhea   Advil [Ibuprofen] Diarrhea   Current Outpatient Medications on File Prior to Visit  Medication Sig Dispense Refill   amLODipine (NORVASC) 5 MG tablet Take 5 mg by mouth daily.     aspirin EC 81 MG tablet Take 1 tablet (81 mg total) by mouth daily. Swallow whole. 30 tablet 11   atorvastatin (LIPITOR) 80 MG tablet TAKE 1 TABLET BY MOUTH DAILY 90 tablet 3   Carboxymethylcellul-Glycerin (LUBRICATING EYE DROPS OP) Place 1 drop into both eyes daily as needed (dry eyes).     carvedilol (COREG) 25 MG tablet Take 1 tablet (25 mg total) by mouth 2 (two) times daily. 180 tablet 3   clopidogrel (PLAVIX) 75 MG tablet TAKE 1 TABLET BY MOUTH DAILY WITH BREAKFAST. 90 tablet 3   doxylamine, Sleep, (UNISOM) 25 MG tablet Take 25 mg by mouth at bedtime as needed for sleep.     empagliflozin (JARDIANCE) 10 MG TABS tablet Take 1 tablet (10 mg total) by mouth daily before breakfast. 90  tablet 3   ezetimibe (ZETIA) 10 MG tablet Take 1 tablet (10 mg total) by mouth daily. 90 tablet 3   furosemide (LASIX) 20 MG tablet Take 1 tablet (20 mg total) by mouth daily as needed. For lower extremity swelling (Patient taking differently: Take 20 mg by mouth daily as needed for fluid or edema.) 90 tablet 3   Multiple Vitamin (MULTIVITAMIN WITH MINERALS) TABS tablet Take 1 tablet by mouth daily.     sacubitril-valsartan (ENTRESTO) 97-103 MG Take 1 tablet by mouth 2 (two) times daily. 180 tablet 3   No current facility-administered medications on file prior to visit.        ROS:  All others reviewed and negative.  Objective        PE:  BP (!) 162/100 (BP Location: Right Arm, Patient Position: Sitting, Cuff Size: Large)    Pulse 85    Temp 99.4 F (37.4 C) (Oral)  Ht 5\' 8"  (1.727 m)    Wt 190 lb (86.2 kg)    SpO2 100%    BMI 28.89 kg/m                 Constitutional: Pt appears in NAD               HENT: Head: NCAT.                Right Ear: External ear normal.                 Left Ear: External ear normal.                Eyes: . Pupils are equal, round, and reactive to light. Conjunctivae and EOM are normal               Nose: without d/c or deformity               Neck: Neck supple. Gross normal ROM               Cardiovascular: Normal rate and regular rhythm.                 Pulmonary/Chest: Effort normal and breath sounds without rales or wheezing.                Abd:  Soft, NT, ND, + BS, no organomegaly               Neurological: Pt is alert. At baseline orientation, motor with residual left isded hemiparesis               Skin: Skin is warm. No rashes, no other new lesions, LE edema - trace bilateral LE               Psychiatric: Pt behavior is normal without agitation   Micro: none  Cardiac tracings I have personally interpreted today:  none  Pertinent Radiological findings (summarize): none   Lab Results  Component Value Date   WBC 9.3 09/20/2021   HGB 14.2  09/20/2021   HCT 43.1 09/20/2021   PLT 283.0 09/20/2021   GLUCOSE 76 09/20/2021   CHOL 123 09/20/2021   TRIG 151.0 (H) 09/20/2021   HDL 35.10 (L) 09/20/2021   LDLDIRECT 90.0 09/26/2017   LDLCALC 58 09/20/2021   ALT 21 09/20/2021   AST 20 09/20/2021   NA 140 09/20/2021   K 5.2 No hemolysis seen (H) 09/20/2021   CL 103 09/20/2021   CREATININE 2.29 (H) 09/20/2021   BUN 38 (H) 09/20/2021   CO2 30 09/20/2021   TSH 1.80 09/20/2021   PSA 1.19 09/20/2021   INR 1.0 06/23/2021   HGBA1C 5.9 09/20/2021   MICROALBUR 0.8 07/15/2006   Assessment/Plan:  Philander Ake Pribyl is a 61 y.o. White or Caucasian [1] male with  has a past medical history of Anemia, unspecified, Arthritis, Diverticulosis of colon (without mention of hemorrhage), Gastric ulcer with hemorrhage and obstruction (2011), GERD (gastroesophageal reflux disease), Hyperlipemia, Hypertension, Personal history of colonic polyps (01/30/2010), Sleep apnea, Stroke ([place]), and Substance abuse ([place]).  Encounter for well adult exam with abnormal findings Age and sex appropriate education and counseling updated with regular exercise and diet Referrals for preventative services - for colonoscopy Immunizations addressed - declines covid booster, shingrix Smoking counseling  - none needed Evidence for depression or other mood disorder - none significant Most recent labs reviewed. I have personally reviewed and have noted: 1) the patient's medical  and social history 2) The patient's current medications and supplements 3) The patient's height, weight, and BMI have been recorded in the chart   HLD (hyperlipidemia) Lab Results  Component Value Date   LDLCALC 58 09/20/2021   Stable, pt to continue current statin lipitor 80   Sinusitis Resolved, pt reassured, no further tx needed  Essential hypertension BP Readings from Last 3 Encounters:  09/20/21 (!) 162/100  09/05/21 126/82  08/08/21 (!) 164/100   uncontrolled, pt to continue  medical treatment coreg, entresto as pt adamant BP at home < 140/90, decline change   CKD (chronic kidney disease) stage 4, GFR 15-29 ml/min (HCC) With worsening in the past year overall, followed closely per renal,  to f/u any worsening symptoms or concerns  Hyperglycemia Lab Results  Component Value Date   HGBA1C 5.9 09/20/2021   Stable, pt to continue current medical treatment  - jardiance   Vitamin D deficiency Last vitamin D Lab Results  Component Value Date   VD25OH 31.86 09/20/2021   Low, to start oral replacement   Followup: Return in about 6 months (around 03/20/2022).  [name], MD 09/24/2021 7:13 AM [facility]

## 2021-09-24 ENCOUNTER — Encounter: Payer: Self-pay | Admitting: Internal Medicine

## 2021-09-24 DIAGNOSIS — E119 Type 2 diabetes mellitus without complications: Secondary | ICD-10-CM | POA: Insufficient documentation

## 2021-09-24 DIAGNOSIS — R739 Hyperglycemia, unspecified: Secondary | ICD-10-CM | POA: Insufficient documentation

## 2021-09-24 DIAGNOSIS — E559 Vitamin D deficiency, unspecified: Secondary | ICD-10-CM | POA: Insufficient documentation

## 2021-09-24 NOTE — Assessment & Plan Note (Signed)
Resolved, pt reassured, no further tx needed 

## 2021-09-24 NOTE — Assessment & Plan Note (Signed)
Lab Results  Component Value Date   HGBA1C 5.9 09/20/2021   Stable, pt to continue current medical treatment  - jardiance

## 2021-09-24 NOTE — Assessment & Plan Note (Signed)
Last vitamin D Lab Results  Component Value Date   VD25OH 31.86 09/20/2021   Low, to start oral replacement

## 2021-09-24 NOTE — Assessment & Plan Note (Signed)
With worsening in the past year overall, followed closely per renal,  to f/u any worsening symptoms or concerns

## 2021-09-24 NOTE — Assessment & Plan Note (Signed)
Age and sex appropriate education and counseling updated with regular exercise and diet Referrals for preventative services - for colonoscopy Immunizations addressed - declines covid booster, shingrix Smoking counseling  - none needed Evidence for depression or other mood disorder - none significant Most recent labs reviewed. I have personally reviewed and have noted: 1) the patient's medical and social history 2) The patient's current medications and supplements 3) The patient's height, weight, and BMI have been recorded in the chart

## 2021-09-24 NOTE — Assessment & Plan Note (Signed)
BP Readings from Last 3 Encounters:  09/20/21 (!) 162/100  09/05/21 126/82  08/08/21 (!) 164/100   uncontrolled, pt to continue medical treatment coreg, entresto as pt adamant BP at home < 140/90, decline change

## 2021-09-24 NOTE — Assessment & Plan Note (Signed)
Lab Results  Component Value Date   LDLCALC 58 09/20/2021   Stable, pt to continue current statin lipitor 80

## 2021-09-26 ENCOUNTER — Telehealth: Payer: Self-pay

## 2021-09-26 NOTE — Telephone Encounter (Signed)
Preoperative team, please contact requesting office and let them know that we are not able to provide blanket coverage.  We need to have specific details surrounding his dental procedures.  Once we have specific details surrounding procedure we will be able to make cardiac recommendations.  Thank you for your help.  Jossie Ng. Cletus Paris NP-C    09/26/2021, 4:52 PM [address] Office [phone] Fax [phone]

## 2021-09-26 NOTE — Telephone Encounter (Signed)
° °  Pre-operative Risk Assessment    Patient Name: Martin Archer  DOB: 31-May-1961 MRN: [id number]     Request for Surgical Clearance    Procedure:   Cleaning,Radiographs,Fillings,Extraction,root canal therapy  Date of Surgery:  Clearance TBD                                 Surgeon:  Dr.Paula Kapec Surgeon's Group or Practice Name:  [facility] Phone number:  [phone] Fax number:  [phone]   Type of Clearance Requested:   - Medical  - Pharmacy:  Hold Aspirin and Clopidogrel (Plavix)     Type of Anesthesia:  Local    Additional requests/questions:  Please advise surgeon/provider what medications should be held. Please fax a copy of clearance to the surgeon's office.  Evalee Mutton   09/26/2021, 4:26 PM

## 2021-09-27 NOTE — Telephone Encounter (Signed)
I s/w Meredith with Dr. Earlie Lou, [place] office. I called to confirm procedure as we cannot provide a blanket type clearance. I explained that most cardiac pt's are on anticoags or antiplatelets. In order to keep the pt safe we will require a clearance for each Tx plan when it comes to multi Tx plan for dental procedures. Ailene Ravel verbalized understanding. I assured Ailene Ravel that I will update the pre op provider. Once the pt has been cleared we will be sure to fax clearance along with any recommendations. Ailene Ravel is aware when ready for the next Tx plan they will fax over a clearance for that procedure.   First Procedure: 3 FILLINGS; NO TEETH ARE BEING EXTRACTED  DOES PT NEED SBE?  ANY PROBLEMS USING LOCAL ANESTHESIA WITH EPINEPHRINE?

## 2021-09-28 NOTE — Telephone Encounter (Signed)
° ° °  Patient Name: Martin Archer  DOB: May 03, 1961 MRN: [id number]  Primary Cardiologist: Larae Grooms, MD  Chart reviewed as part of pre-operative protocol coverage. Given past medical history and time since last visit, based on ACC/AHA guidelines, [name] would be at acceptable risk for the planned procedure without further cardiovascular testing.   Case reviewed with Dr. Fletcher Anon, patient does not need to hold antiplatelet therapy for dental filling.  He does not require any SBE prophylaxis either.  The patient was advised that if he develops new symptoms prior to surgery to contact our office to arrange for a follow-up visit, and he verbalized understanding.  I will route this recommendation to the requesting party via Epic fax function and remove from pre-op pool.  Please call with questions.  [place], [place] 09/28/2021, 3:03 PM

## 2021-09-28 NOTE — Telephone Encounter (Signed)
Dental fillings do not require interruption of dual antiplatelet therapy.

## 2021-09-28 NOTE — Telephone Encounter (Signed)
° ° °  Patient Name: Martin Archer  DOB: [birth year] MRN: [id number]  Primary Cardiologist: Larae Grooms, MD  Chart reviewed as part of pre-operative protocol coverage. Given past medical history and time since last visit, based on ACC/AHA guidelines, Martin Archer would be at acceptable risk for the planned procedure without further cardiovascular testing.   Patient does not require any SBE prophylaxis.  Will defer the decision to hold Plavix to Dr. Fletcher Anon as the patient was placed on Plavix in Sept 2022 after left subclavian artery stenting. Since then, he recently underwent right redo carotid endarterectomy surgery on 06/27/2021 by Dr. [name].  1-2 teeth dental filling has low bleeding risk and may not need to hold antiplatelet therapy at all.  If more than 2 teeth is being operated on, will defer the decision to Dr. Fletcher Anon.   Dr. Fletcher Anon, please comment on aspirin and plavix therapy in light of possible dental procedures. Please forward your recommendation to P CV DIV PREOP  Please call with questions.  [place], [place] 09/28/2021, 9:01 AM

## 2021-10-02 ENCOUNTER — Telehealth: Payer: Self-pay | Admitting: Cardiovascular Disease

## 2021-10-02 ENCOUNTER — Other Ambulatory Visit: Payer: Self-pay | Admitting: Nephrology

## 2021-10-02 DIAGNOSIS — N2581 Secondary hyperparathyroidism of renal origin: Secondary | ICD-10-CM

## 2021-10-02 DIAGNOSIS — N184 Chronic kidney disease, stage 4 (severe): Secondary | ICD-10-CM

## 2021-10-02 DIAGNOSIS — I129 Hypertensive chronic kidney disease with stage 1 through stage 4 chronic kidney disease, or unspecified chronic kidney disease: Secondary | ICD-10-CM

## 2021-10-02 NOTE — Telephone Encounter (Signed)
°  ° ° ° ° ° °  Pre-operative Risk Assessment    Patient Name: Martin Archer  DOB: 10/23/1960 MRN: [id number]     Request for Surgical Clearance    Procedure: Fillings x2  Date of Surgery:  Clearance 10/02/21                                 Surgeon:  Toribio Harbour Kapec Surgeon's Group or Practice Name:  [facility] Phone number:  [phone] Fax number:  [phone]     Type of Clearance Requested:   - Medical    Type of Anesthesia:  Local    Additional requests/questions:  Dentist office needs to know if the patient needs to be pre-medicated   Signed, Johnna Acosta   10/02/2021, 10:15 AM

## 2021-10-02 NOTE — Telephone Encounter (Signed)
Dentist was on phone, this was a duplicate request.  Resent the original clearance form to [id number].

## 2021-10-24 ENCOUNTER — Ambulatory Visit
Admission: RE | Admit: 2021-10-24 | Discharge: 2021-10-24 | Disposition: A | Discharge status: Home / Self Care | Payer: 59 | Source: Ambulatory Visit | Attending: Nephrology | Admitting: Nephrology

## 2021-10-24 DIAGNOSIS — N2581 Secondary hyperparathyroidism of renal origin: Secondary | ICD-10-CM

## 2021-10-24 DIAGNOSIS — I129 Hypertensive chronic kidney disease with stage 1 through stage 4 chronic kidney disease, or unspecified chronic kidney disease: Secondary | ICD-10-CM

## 2021-10-24 DIAGNOSIS — N184 Chronic kidney disease, stage 4 (severe): Secondary | ICD-10-CM

## 2022-02-05 ENCOUNTER — Other Ambulatory Visit: Payer: Self-pay

## 2022-02-05 MED ORDER — EZETIMIBE 10 MG PO TABS: 10.0000 mg | ORAL_TABLET | Freq: Every day | ORAL | 1 refills | Status: DC

## 2022-03-01 ENCOUNTER — Other Ambulatory Visit: Payer: Self-pay | Admitting: Interventional Cardiology

## 2022-03-22 ENCOUNTER — Ambulatory Visit (INDEPENDENT_AMBULATORY_CARE_PROVIDER_SITE_OTHER): Payer: 59

## 2022-03-22 DIAGNOSIS — Z0000 Encounter for general adult medical examination without abnormal findings: Secondary | ICD-10-CM | POA: Diagnosis not present

## 2022-03-22 DIAGNOSIS — Z1211 Encounter for screening for malignant neoplasm of colon: Secondary | ICD-10-CM

## 2022-03-22 NOTE — Patient Instructions (Signed)
[name] , Thank you for taking time to come for your Medicare Wellness Visit. I appreciate your ongoing commitment to your health goals. Please review the following plan we discussed and let me know if I can assist you in the future.   Screening recommendations/referrals: Colonoscopy: Order placed with [facility] on 03/22/2022 for consult. Recommended yearly ophthalmology/optometry visit for glaucoma screening and checkup Recommended yearly dental visit for hygiene and checkup  Vaccinations: Influenza vaccine: 05/29/2021 Pneumococcal vaccine: 08/01/2017 Tdap vaccine: 10/02/2013; due every 10 years Shingles vaccine: never done   Covid-19: 12/25/2019, 01/22/2020, 06/20/2020  Advanced directives: Yes; Please bring a copy of your health care power of attorney and living will to the office at your convenience.  Conditions/risks identified: Yes  Next appointment: Please schedule your next Medicare Wellness Visit with your Nurse Health Advisor in 1 year by calling [phone].  Preventive Care 40-64 Years, Male Preventive care refers to lifestyle choices and visits with your health care provider that can promote health and wellness. What does preventive care include? A yearly physical exam. This is also called an annual well check. Dental exams once or twice a year. Routine eye exams. Ask your health care provider how often you should have your eyes checked. Personal lifestyle choices, including: Daily care of your teeth and gums. Regular physical activity. Eating a healthy diet. Avoiding tobacco and drug use. Limiting alcohol use. Practicing safe sex. Taking low-dose aspirin every day starting at age 49. What happens during an annual well check? The services and screenings done by your health care provider during your annual well check will depend on your age, overall health, lifestyle risk factors, and family history of disease. Counseling  Your health care provider may ask you  questions about your: Alcohol use. Tobacco use. Drug use. Emotional well-being. Home and relationship well-being. Sexual activity. Eating habits. Work and work Statistician. Screening  You may have the following tests or measurements: Height, weight, and BMI. Blood pressure. Lipid and cholesterol levels. These may be checked every 5 years, or more frequently if you are over 72 years old. Skin check. Lung cancer screening. You may have this screening every year starting at age 74 if you have a 30-pack-year history of smoking and currently smoke or have quit within the past 15 years. Fecal occult blood test (FOBT) of the stool. You may have this test every year starting at age 79. Flexible sigmoidoscopy or colonoscopy. You may have a sigmoidoscopy every 5 years or a colonoscopy every 10 years starting at age 9. Prostate cancer screening. Recommendations will vary depending on your family history and other risks. Hepatitis C blood test. Hepatitis B blood test. Sexually transmitted disease (STD) testing. Diabetes screening. This is done by checking your blood sugar (glucose) after you have not eaten for a while (fasting). You may have this done every 1-3 years. Discuss your test results, treatment options, and if necessary, the need for more tests with your health care provider. Vaccines  Your health care provider may recommend certain vaccines, such as: Influenza vaccine. This is recommended every year. Tetanus, diphtheria, and acellular pertussis (Tdap, Td) vaccine. You may need a Td booster every 10 years. Zoster vaccine. You may need this after age 78. Pneumococcal 13-valent conjugate (PCV13) vaccine. You may need this if you have certain conditions and have not been vaccinated. Pneumococcal polysaccharide (PPSV23) vaccine. You may need one or two doses if you smoke cigarettes or if you have certain conditions. Talk to your health care provider about  which screenings and vaccines you  need and how often you need them. This information is not intended to replace advice given to you by your health care provider. Make sure you discuss any questions you have with your health care provider. Document Released: 09/09/2015 Document Revised: 05/02/2016 Document Reviewed: 06/14/2015 Elsevier Interactive Patient Education  2017 [place] Prevention in the Home Falls can cause injuries. They can happen to people of all ages. There are many things you can do to make your home safe and to help prevent falls. What can I do on the outside of my home? Regularly fix the edges of walkways and driveways and fix any cracks. Remove anything that might make you trip as you walk through a door, such as a raised step or threshold. Trim any bushes or trees on the path to your home. Use bright outdoor lighting. Clear any walking paths of anything that might make someone trip, such as rocks or tools. Regularly check to see if handrails are loose or broken. Make sure that both sides of any steps have handrails. Any raised decks and porches should have guardrails on the edges. Have any leaves, snow, or ice cleared regularly. Use sand or salt on walking paths during winter. Clean up any spills in your garage right away. This includes oil or grease spills. What can I do in the bathroom? Use night lights. Install grab bars by the toilet and in the tub and shower. Do not use towel bars as grab bars. Use non-skid mats or decals in the tub or shower. If you need to sit down in the shower, use a plastic, non-slip stool. Keep the floor dry. Clean up any water that spills on the floor as soon as it happens. Remove soap buildup in the tub or shower regularly. Attach bath mats securely with double-sided non-slip rug tape. Do not have throw rugs and other things on the floor that can make you trip. What can I do in the bedroom? Use night lights. Make sure that you have a light by your bed that is  easy to reach. Do not use any sheets or blankets that are too big for your bed. They should not hang down onto the floor. Have a firm chair that has side arms. You can use this for support while you get dressed. Do not have throw rugs and other things on the floor that can make you trip. What can I do in the kitchen? Clean up any spills right away. Avoid walking on wet floors. Keep items that you use a lot in easy-to-reach places. If you need to reach something above you, use a strong step stool that has a grab bar. Keep electrical cords out of the way. Do not use floor polish or wax that makes floors slippery. If you must use wax, use non-skid floor wax. Do not have throw rugs and other things on the floor that can make you trip. What can I do with my stairs? Do not leave any items on the stairs. Make sure that there are handrails on both sides of the stairs and use them. Fix handrails that are broken or loose. Make sure that handrails are as long as the stairways. Check any carpeting to make sure that it is firmly attached to the stairs. Fix any carpet that is loose or worn. Avoid having throw rugs at the top or bottom of the stairs. If you do have throw rugs, attach them to the floor with  carpet tape. Make sure that you have a light switch at the top of the stairs and the bottom of the stairs. If you do not have them, ask someone to add them for you. What else can I do to help prevent falls? Wear shoes that: Do not have high heels. Have rubber bottoms. Are comfortable and fit you well. Are closed at the toe. Do not wear sandals. If you use a stepladder: Make sure that it is fully opened. Do not climb a closed stepladder. Make sure that both sides of the stepladder are locked into place. Ask someone to hold it for you, if possible. Clearly mark and make sure that you can see: Any grab bars or handrails. First and last steps. Where the edge of each step is. Use tools that help you  move around (mobility aids) if they are needed. These include: Canes. Walkers. Scooters. Crutches. Turn on the lights when you go into a dark area. Replace any light bulbs as soon as they burn out. Set up your furniture so you have a clear path. Avoid moving your furniture around. If any of your floors are uneven, fix them. If there are any pets around you, be aware of where they are. Review your medicines with your doctor. Some medicines can make you feel dizzy. This can increase your chance of falling. Ask your doctor what other things that you can do to help prevent falls. This information is not intended to replace advice given to you by your health care provider. Make sure you discuss any questions you have with your health care provider. Document Released: 06/09/2009 Document Revised: 01/19/2016 Document Reviewed: 09/17/2014 Elsevier Interactive Patient Education  2017 [employer].

## 2022-03-22 NOTE — Progress Notes (Signed)
I connected with Martin Archer today by telephone and verified that I am speaking with the correct person using two identifiers. Location patient: home Location provider: work Persons participating in the virtual visit: patient, provider.   I discussed the limitations, risks, security and privacy concerns of performing an evaluation and management service by telephone and the availability of in person appointments. I also discussed with the patient that there may be a patient responsible charge related to this service. The patient expressed understanding and verbally consented to this telephonic visit.    Interactive audio and video telecommunications were attempted between this provider and patient, however failed, due to patient having technical difficulties OR patient did not have access to video capability.  We continued and completed visit with audio only.  Some vital signs may be absent or patient reported.   Time Spent with patient on telephone encounter: 45 minutes  Subjective:   Martin Archer is a 61 y.o. male who presents for an Initial Medicare Annual Wellness Visit.  Review of Systems     Cardiac Risk Factors include: advanced age (>6men, >50 women);dyslipidemia;family history of premature cardiovascular disease;hypertension;male gender     Objective:    There were no vitals filed for this visit. There is no height or weight on file to calculate BMI.     03/22/2022    8:55 AM 06/23/2021   10:34 AM 04/26/2021    3:00 PM 04/26/2021    6:26 AM 01/25/2021    9:02 AM 10/19/2017   10:19 AM 10/17/2017    7:18 PM  Advanced Directives  Does Patient Have a Medical Advance Directive? Yes Yes Yes Yes Yes No No  Type of Advance Directive Living will;Healthcare Power of [place];Living will [place];Living will [place];Living will [place];Living will    Does patient want to make changes  to medical advance directive? No - Patient declined No - Patient declined No - Patient declined  No - Patient declined    Copy of [place] in Chart? No - copy requested  No - copy requested  No - copy requested    Would patient like information on creating a medical advance directive?       No - Patient declined    Current Medications (verified) Outpatient Encounter Medications as of 03/22/2022  Medication Sig   acetaminophen (TYLENOL) 500 MG tablet Take 500 mg by mouth every 6 (six) hours as needed.   aspirin EC 81 MG tablet Take 1 tablet (81 mg total) by mouth daily. Swallow whole.   atorvastatin (LIPITOR) 80 MG tablet TAKE 1 TABLET BY MOUTH DAILY   Carboxymethylcellul-Glycerin (LUBRICATING EYE DROPS OP) Place 1 drop into both eyes daily as needed (dry eyes).   carvedilol (COREG) 25 MG tablet Take 1 tablet (25 mg total) by mouth 2 (two) times daily.   clopidogrel (PLAVIX) 75 MG tablet TAKE 1 TABLET BY MOUTH DAILY WITH BREAKFAST.   doxylamine, Sleep, (UNISOM) 25 MG tablet Take 25 mg by mouth at bedtime as needed for sleep.   empagliflozin (JARDIANCE) 10 MG TABS tablet Take 1 tablet (10 mg total) by mouth daily before breakfast.   ENTRESTO 97-103 MG TAKE 1 TABLET BY MOUTH TWICE DAILY   ezetimibe (ZETIA) 10 MG tablet Take 1 tablet (10 mg total) by mouth daily.   furosemide (LASIX) 20 MG tablet Take 1 tablet (20 mg total) by mouth daily as needed. For lower extremity swelling (Patient  taking differently: Take 20 mg by mouth daily as needed for fluid or edema.)   Multiple Vitamin (MULTIVITAMIN WITH MINERALS) TABS tablet Take 1 tablet by mouth daily.   amLODipine (NORVASC) 5 MG tablet Take 5 mg by mouth daily. (Patient not taking: Reported on 03/22/2022)   No facility-administered encounter medications on file as of 03/22/2022.    Allergies (verified) Adderall [amphetamine-dextroamphet er], Prozac [fluoxetine hcl], Sertraline hcl, and Advil [ibuprofen]   History: Past  Medical History:  Diagnosis Date   Anemia, unspecified    Arthritis    Diverticulosis of colon (without mention of hemorrhage)    Gastric ulcer with hemorrhage and obstruction 2011   Dr [facility]   GERD (gastroesophageal reflux disease)    hx   Hyperlipemia    Hypertension    Personal history of colonic polyps 01/30/2010   hyperplastic rectal   Sleep apnea    on CPAP; [facility]   Stroke [facility])    mini strokes-bilateral carotid endarterectomy   Substance abuse ([place])    History of alcohol abuse   Past Surgical History:  Procedure Laterality Date   AORTIC ARCH ANGIOGRAPHY N/A 04/26/2021   Procedure: AORTIC ARCH ANGIOGRAPHY;  Surgeon: Wellington Hampshire, MD;  Location: [place] [facility];  Service: Cardiovascular;  Laterality: N/A;   CARDIAC CATHETERIZATION  01/25/2021   CAROTID ENDARTERECTOMY  11/2006   left Dr Mamie Nick   COLONOSCOPY  2011   DP-positive FOBT/IDA   ENDARTERECTOMY Right 01/19/2015   Procedure: RIGHT CAROTID ENDARTERECTOMY WITH PATCH ANGIOPLASTY;  Surgeon: Mal Misty, MD;  Location: [place];  Service: Vascular;  Laterality: Right;   ENDARTERECTOMY Right 06/27/2021   Procedure: RIGHT REDO CAROTID ARTERY ENDARTERECTOMY & PLACEMENT OF 15Fr DRAIN;  Surgeon: Angelia Mould, MD;  Location: [place];  Service: Vascular;  Laterality: Right;   ESOPHAGOGASTRODUODENOSCOPY Left 06/17/2013   Procedure: ESOPHAGOGASTRODUODENOSCOPY (EGD);  Surgeon: Arta Silence, MD;  Location: [facility] ENDOSCOPY;  Service: Endoscopy;  Laterality: Left;   ESOPHAGOGASTRODUODENOSCOPY N/A 09/20/2015   Procedure: ESOPHAGOGASTRODUODENOSCOPY (EGD);  Surgeon: Ladene Artist, MD;  [facility]COPY;  Service: Endoscopy;  Laterality: N/A;   FOOT SURGERY Bilateral 2020   Taylor's foot   INGUINAL HERNIA REPAIR  1963   NASAL FRACTURE SURGERY  1974   PATCH ANGIOPLASTY Right 06/27/2021   Procedure: PATCH ANGIOPLASTY USING Rueben Bash BIOLOGIC PATCH;  Surgeon: Angelia Mould,  MD;  Location: [place];  Service: Vascular;  Laterality: Right;   PERIPHERAL VASCULAR INTERVENTION  04/26/2021   Procedure: PERIPHERAL VASCULAR INTERVENTION;  Surgeon: Wellington Hampshire, MD;  Location: [place] [facility];  Service: Cardiovascular;;   RIGHT/LEFT HEART CATH AND CORONARY ANGIOGRAPHY N/A 01/25/2021   Procedure: RIGHT/LEFT HEART CATH AND CORONARY ANGIOGRAPHY;  Surgeon: Jettie Booze, MD;  Location: [place] [facility];  Service: Cardiovascular;  Laterality: N/A;   TEE WITHOUT CARDIOVERSION N/A 01/17/2015   Procedure: TRANSESOPHAGEAL ECHOCARDIOGRAM (TEE);  Surgeon: Sanda Klein, MD;  [facility];  Service: Cardiovascular;  Laterality: N/A;   WISDOM TOOTH EXTRACTION     Family History  Problem Relation Age of Onset   Diabetes Father    Hypertension Father    Stroke Father        in late 28s   Heart attack Mother        in 17s   Heart attack Brother 61   Diabetes Paternal Grandfather    Cancer Maternal Grandfather        unknown type   Heart attack  Maternal Grandfather 35   Stroke Paternal Uncle        in late 23s   Colon cancer Neg Hx    Esophageal cancer Neg Hx    Stomach cancer Neg Hx    Colon polyps Neg Hx    Rectal cancer Neg Hx    Social History   Socioeconomic History   Marital status: Divorced    Spouse name: Not on file   Number of children: 2   Years of education: Not on file   Highest education level: Not on file  Occupational History   Occupation: self employeed Scientist, clinical (histocompatibility and immunogenetics): [employer]   Tobacco Use   Smoking status: Former    Packs/day: 0.50    Types: Cigarettes    Quit date: 06/22/2015    Years since quitting: 6.7   Smokeless tobacco: Never   Tobacco comments:    smoked 1976-2007 & 2009- present , up to 2 ppd, average > 1ppd  Vaping Use   Vaping Use: Never used  Substance and Sexual Activity   Alcohol use: Not Currently    Alcohol/week: 0.0 standard drinks of alcohol   Drug use: No   Sexual activity: Yes  Other  Topics Concern   Not on file  Social History Narrative   Lives alone.  Daughter lives in area.    Social Determinants of Health   Financial Resource Strain: Low Risk  (03/22/2022)   Overall Financial Resource Strain (CARDIA)    Difficulty of Paying Living Expenses: Not hard at all  Food Insecurity: No Food Insecurity (03/22/2022)   Hunger Vital Sign    Worried About Running Out of Food in the Last Year: Never true    Ran Out of Food in the Last Year: Never true  Transportation Needs: No Transportation Needs (03/22/2022)   PRAPARE - Hydrologist (Medical): No    Lack of Transportation (Non-Medical): No  Physical Activity: Sufficiently Active (03/22/2022)   Exercise Vital Sign    Days of Exercise per Week: 7 days    Minutes of Exercise per Session: 30 min  Stress: No Stress Concern Present (03/22/2022)   [place]    Feeling of Stress : Not at all  Social Connections: Unknown (03/22/2022)   Social Connection and Isolation Panel [NHANES]    Frequency of Communication with Friends and Family: More than three times a week    Frequency of Social Gatherings with Friends and Family: Three times a week    Attends Religious Services: Patient refused    Active Member of Clubs or Organizations: Patient refused    Attends Archivist Meetings: Patient refused    Marital Status: Divorced    Tobacco Counseling Counseling given: Not Answered Tobacco comments: smoked 1976-2007 & 2009- present , up to 2 ppd, average > 1ppd   Clinical Intake:  Pre-visit preparation completed: Yes  Pain : No/denies pain     BMI - recorded: 28.9 (09/20/2021) Nutritional Risks: None Diabetes: No  How often do you need to have someone help you when you read instructions, pamphlets, or other written materials from your doctor or pharmacy?: 1 - Never What is the last grade level you completed in school?: Some  College  Diabetic? no  Interpreter Needed?: No  Information entered by :: Lisette Abu, LPN.   Activities of Daily Living    03/22/2022    9:12 AM 06/23/2021   10:38  AM  In your present state of health, do you have any difficulty performing the following activities:  Hearing? 1   Vision? 0   Difficulty concentrating or making decisions? 0   Walking or climbing stairs? 0   Dressing or bathing? 0   Doing errands, shopping? 0 0  Preparing Food and eating ? N   Using the Toilet? N   In the past six months, have you accidently leaked urine? N   Do you have problems with loss of bowel control? N   Managing your Medications? N   Managing your Finances? N     Patient Care Team: Biagio Borg, MD as PCP - General (Internal Medicine) Jettie Booze, MD as PCP - Cardiology (Cardiology) Wellington Hampshire, MD as PCP - [facility] (Cardiology) [place] as Consulting Physician (Optometry)  Indicate any recent Medical Services you may have received from other than [facility] providers in the past year (date may be approximate).     Assessment:   This is a routine wellness examination for [place].  Hearing/Vision screen Hearing Screening - Comments:: Patient wears hearing aids. Vision Screening - Comments:: Patient does wear corrective lenses/contacts.  Eye exam done by: Adline Potter, OD with [place] issues and exercise activities discussed: Current Exercise Habits: Home exercise routine, Type of exercise: walking, Time (Minutes): 30, Frequency (Times/Week): 7, Weekly Exercise (Minutes/Week): 210, Intensity: Moderate, Exercise limited by: cardiac condition(s)   Goals Addressed             This Visit's Progress    To love 10-20 pounds.        Depression Screen    03/22/2022    9:00 AM 09/20/2021   11:08 AM 12/19/2020   11:39 AM 03/23/2020    3:28 PM 03/23/2020    3:09 PM 11/07/2018    2:57 PM 09/26/2017    3:00 PM  PHQ 2/9 Scores   PHQ - 2 Score 0 0 0 0 0 0 0  PHQ- 9 Score       0    Fall Risk    03/22/2022    8:56 AM 09/20/2021   11:08 AM 12/19/2020   11:39 AM 03/23/2020    3:28 PM 03/23/2020    3:08 PM  Fall Risk   Falls in the past year? 0 0 0 0 0  Number falls in past yr: 0 0   0  Injury with Fall? 0 0   0  Risk for fall due to : No Fall Risks    No Fall Risks  Follow up Falls evaluation completed    Falls evaluation completed    [place]:  Any stairs in or around the home? Yes  If so, are there any without handrails? No  Home free of loose throw rugs in walkways, pet beds, electrical cords, etc? Yes  Adequate lighting in your home to reduce risk of falls? Yes   ASSISTIVE DEVICES UTILIZED TO PREVENT FALLS:  Life alert? No  Use of a cane, walker or w/c? No  Grab bars in the bathroom? Yes  Shower chair or bench in shower? No  Elevated toilet seat or a handicapped toilet? No   TIMED UP AND GO:  Was the test performed? No .  Length of time to ambulate 10 feet: n/a sec.   Appearance of gait: Gait not evaluated during this visit.  Cognitive Function:  03/22/2022    9:00 AM  6CIT Screen  What Year? 0 points  What month? 0 points  What time? 0 points  Count back from 20 0 points  Months in reverse 0 points  Repeat phrase 0 points  Total Score 0 points    Immunizations Immunization History  Administered Date(s) Administered   Influenza Whole 06/28/2010   Influenza,inj,Quad PF,6+ Mos 07/25/2017, 05/20/2019, 05/29/2021   Moderna Sars-Covid-2 Vaccination 12/25/2019, 01/22/2020, 06/20/2020   Pneumococcal Polysaccharide-23 08/01/2017   Td 08/27/2001, 10/02/2013    TDAP status: Up to date  Flu Vaccine status: Up to date  Pneumococcal vaccine status: Up to date  Covid-19 vaccine status: Completed vaccines  Qualifies for Shingles Vaccine? Yes   Zostavax completed No   Shingrix Completed?: No.    Education has been provided regarding the  importance of this vaccine. Patient has been advised to call insurance company to determine out of pocket expense if they have not yet received this vaccine. Advised may also receive vaccine at local pharmacy or Health Dept. Verbalized acceptance and understanding.  Screening Tests Health Maintenance  Topic Date Due   Zoster Vaccines- Shingrix (1 of 2) Never done   COLONOSCOPY (Pts 45-26yrs Insurance coverage will need to be confirmed)  01/28/2015   COVID-19 Vaccine (4 - Booster for Moderna series) 08/15/2020   INFLUENZA VACCINE  03/27/2022   TETANUS/TDAP  10/03/2023   Hepatitis C Screening  Completed   HIV Screening  Completed   HPV VACCINES  Aged Out    Health Maintenance  Health Maintenance Due  Topic Date Due   Zoster Vaccines- Shingrix (1 of 2) Never done   COLONOSCOPY (Pts 45-78yrs Insurance coverage will need to be confirmed)  01/28/2015   COVID-19 Vaccine (4 - Booster for Moderna series) 08/15/2020    Colorectal cancer screening: Referral to GI placed 03/22/2022. Pt aware the office will call re: appt.  Lung Cancer Screening: (Low Dose CT Chest recommended if Age 58-80 years, 30 pack-year currently smoking OR have quit w/in 15years.) does not qualify.   Lung Cancer Screening Referral: no  Additional Screening:  Hepatitis C Screening: does qualify; Completed 06/30/2015  Vision Screening: Recommended annual ophthalmology exams for early detection of glaucoma and other disorders of the eye. Is the patient up to date with their annual eye exam?  Yes  Who is the provider or what is the name of the office in which the patient attends annual eye exams? Dr. Adline Potter If pt is not established with a provider, would they like to be referred to a provider to establish care? No .   Dental Screening: Recommended annual dental exams for proper oral hygiene  Community Resource Referral / Chronic Care Management: CRR required this visit?  No   CCM required this visit?  No       Plan:     I have personally reviewed and noted the following in the patient's chart:   Medical and social history Use of alcohol, tobacco or illicit drugs  Current medications and supplements including opioid prescriptions. Patient is not currently taking opioid prescriptions. Functional ability and status Nutritional status Physical activity Advanced directives List of other physicians Hospitalizations, surgeries, and ER visits in previous 12 months Vitals Screenings to include cognitive, depression, and falls Referrals and appointments  In addition, I have reviewed and discussed with patient certain preventive protocols, quality metrics, and best practice recommendations. A written personalized care plan for preventive services as well as general preventive health recommendations were provided  to patient.     Sheral Flow, LPN   [id number]/63/[id number]   Nurse Notes:  Patient is cogitatively intact. There were no vitals filed for this visit. There is no height or weight on file to calculate BMI. Patient stated that he has no issues with gait or balance; does not use any assistive devices.

## 2022-03-28 ENCOUNTER — Ambulatory Visit (INDEPENDENT_AMBULATORY_CARE_PROVIDER_SITE_OTHER): Payer: 59 | Admitting: Internal Medicine

## 2022-03-28 VITALS — BP 122/76 | HR 80 | Temp 98.1°F | Ht 68.0 in | Wt 200.0 lb

## 2022-03-28 DIAGNOSIS — N184 Chronic kidney disease, stage 4 (severe): Secondary | ICD-10-CM

## 2022-03-28 DIAGNOSIS — E559 Vitamin D deficiency, unspecified: Secondary | ICD-10-CM

## 2022-03-28 DIAGNOSIS — E1165 Type 2 diabetes mellitus with hyperglycemia: Secondary | ICD-10-CM

## 2022-03-28 DIAGNOSIS — L989 Disorder of the skin and subcutaneous tissue, unspecified: Secondary | ICD-10-CM

## 2022-03-28 DIAGNOSIS — R739 Hyperglycemia, unspecified: Secondary | ICD-10-CM

## 2022-03-28 DIAGNOSIS — E7800 Pure hypercholesterolemia, unspecified: Secondary | ICD-10-CM

## 2022-03-28 DIAGNOSIS — I10 Essential (primary) hypertension: Secondary | ICD-10-CM

## 2022-03-28 NOTE — Progress Notes (Unsigned)
Patient ID: Martin Archer, male   DOB: 1960-09-01, 61 y.o.   MRN: [id number]        Chief Complaint: follow up HTN, HLD and hyperglydmeia, skin lesion left upper arm, ckd       HPI:  Martin Archer is a 61 y.o. male here overall doing ok, has left upper arm skin lesion non healing for 3 mo and how sort of a very shallow scabby ulcer like, nontender, non draining.  Pt denies chest pain, increased sob or doe, wheezing, orthopnea, PND, increased LE swelling, palpitations, dizziness or syncope.   Pt denies polydipsia, polyuria, or new focal neuro s/s.    Pt denies fever, wt loss, night sweats, loss of appetite, or other constitutional symptoms         Wt Readings from Last 3 Encounters:  03/28/22 200 lb (90.7 kg)  09/20/21 190 lb (86.2 kg)  09/05/21 190 lb 9.6 oz (86.5 kg)   BP Readings from Last 3 Encounters:  03/28/22 122/76  09/20/21 (!) 162/100  09/05/21 126/82         Past Medical History:  Diagnosis Date   Anemia, unspecified    Arthritis    Diverticulosis of colon (without mention of hemorrhage)    Gastric ulcer with hemorrhage and obstruction 2011   Dr [facility]   GERD (gastroesophageal reflux disease)    hx   Hyperlipemia    Hypertension    Personal history of colonic polyps 01/30/2010   hyperplastic rectal   Sleep apnea    on CPAP; [facility]   Stroke [facility])    mini strokes-bilateral carotid endarterectomy   Substance abuse ([place])    History of alcohol abuse   Past Surgical History:  Procedure Laterality Date   AORTIC ARCH ANGIOGRAPHY N/A 04/26/2021   Procedure: AORTIC ARCH ANGIOGRAPHY;  Surgeon: Wellington Hampshire, MD;  Location: [place] [facility];  Service: Cardiovascular;  Laterality: N/A;   CARDIAC CATHETERIZATION  01/25/2021   CAROTID ENDARTERECTOMY  11/2006   left Dr Mamie Nick   COLONOSCOPY  2011   DP-positive FOBT/IDA   ENDARTERECTOMY Right 01/19/2015   Procedure: RIGHT CAROTID ENDARTERECTOMY WITH PATCH ANGIOPLASTY;   Surgeon: Mal Misty, MD;  Location: [place];  Service: Vascular;  Laterality: Right;   ENDARTERECTOMY Right 06/27/2021   Procedure: RIGHT REDO CAROTID ARTERY ENDARTERECTOMY & PLACEMENT OF 15Fr DRAIN;  Surgeon: Angelia Mould, MD;  Location: [place];  Service: Vascular;  Laterality: Right;   ESOPHAGOGASTRODUODENOSCOPY Left 06/17/2013   Procedure: ESOPHAGOGASTRODUODENOSCOPY (EGD);  Surgeon: Arta Silence, MD;  Location: [facility];  Service: Endoscopy;  Laterality: Left;   ESOPHAGOGASTRODUODENOSCOPY N/A 09/20/2015   Procedure: ESOPHAGOGASTRODUODENOSCOPY (EGD);  Surgeon: Ladene Artist, MD;  Location: [facility] ENDOSCOPY;  Service: Endoscopy;  Laterality: N/A;   FOOT SURGERY Bilateral 2020   Taylor's foot   INGUINAL HERNIA REPAIR  1963   NASAL FRACTURE SURGERY  1974   PATCH ANGIOPLASTY Right 06/27/2021   Procedure: PATCH ANGIOPLASTY USING Rueben Bash BIOLOGIC PATCH;  Surgeon: Angelia Mould, MD;  Location: [place];  Service: Vascular;  Laterality: Right;   PERIPHERAL VASCULAR INTERVENTION  04/26/2021   Procedure: PERIPHERAL VASCULAR INTERVENTION;  Surgeon: Wellington Hampshire, MD;  Location: [place] [facility];  Service: Cardiovascular;;   RIGHT/LEFT HEART CATH AND CORONARY ANGIOGRAPHY N/A 01/25/2021   Procedure: RIGHT/LEFT HEART CATH AND CORONARY ANGIOGRAPHY;  Surgeon: Jettie Booze, MD;  Location: [place] [facility];  Service: Cardiovascular;  Laterality: N/A;   TEE WITHOUT  CARDIOVERSION N/A 01/17/2015   Procedure: TRANSESOPHAGEAL ECHOCARDIOGRAM (TEE);  Surgeon: Sanda Klein, MD;  Location: [facility] ENDOSCOPY;  Service: Cardiovascular;  Laterality: N/A;   WISDOM TOOTH EXTRACTION      reports that he quit smoking about 6 years ago. His smoking use included cigarettes. He smoked an average of .5 packs per day. He has never used smokeless tobacco. He reports that he does not currently use alcohol. He reports that he does not use drugs. family history includes Cancer in his maternal  grandfather; Diabetes in his father and paternal grandfather; Heart attack in his mother; Heart attack (age of onset: 36) in his brother; Heart attack (age of onset: 74) in his maternal grandfather; Hypertension in his father; Stroke in his father and paternal uncle. Allergies  Allergen Reactions   Adderall [Amphetamine-Dextroamphet Er] Diarrhea   Prozac [Fluoxetine Hcl] Diarrhea   Sertraline Hcl Diarrhea   Advil [Ibuprofen] Diarrhea   Current Outpatient Medications on File Prior to Visit  Medication Sig Dispense Refill   acetaminophen (TYLENOL) 500 MG tablet Take 500 mg by mouth every 6 (six) hours as needed.     amLODipine (NORVASC) 5 MG tablet Take 10 mg by mouth daily.     aspirin EC 81 MG tablet Take 1 tablet (81 mg total) by mouth daily. Swallow whole. 30 tablet 11   atorvastatin (LIPITOR) 80 MG tablet TAKE 1 TABLET BY MOUTH DAILY 90 tablet 3   Carboxymethylcellul-Glycerin (LUBRICATING EYE DROPS OP) Place 1 drop into both eyes daily as needed (dry eyes).     carvedilol (COREG) 25 MG tablet Take 1 tablet (25 mg total) by mouth 2 (two) times daily. 180 tablet 3   clopidogrel (PLAVIX) 75 MG tablet TAKE 1 TABLET BY MOUTH DAILY WITH BREAKFAST. 90 tablet 3   doxylamine, Sleep, (UNISOM) 25 MG tablet Take 25 mg by mouth at bedtime as needed for sleep.     empagliflozin (JARDIANCE) 10 MG TABS tablet Take 1 tablet (10 mg total) by mouth daily before breakfast. 90 tablet 3   ENTRESTO 97-103 MG TAKE 1 TABLET BY MOUTH TWICE DAILY 60 tablet 0   ezetimibe (ZETIA) 10 MG tablet Take 1 tablet (10 mg total) by mouth daily. 90 tablet 1   furosemide (LASIX) 20 MG tablet Take 1 tablet (20 mg total) by mouth daily as needed. For lower extremity swelling (Patient taking differently: Take 20 mg by mouth daily as needed for fluid or edema.) 90 tablet 3   Multiple Vitamin (MULTIVITAMIN WITH MINERALS) TABS tablet Take 1 tablet by mouth daily.     No current facility-administered medications on file prior to visit.         ROS:  All others reviewed and negative.  Objective        PE:  BP 122/76 (BP Location: Right Arm, Patient Position: Sitting, Cuff Size: Large)   Pulse 80   Temp 98.1 F (36.7 C) (Oral)   Ht 5\' 8"  (1.727 m)   Wt 200 lb (90.7 kg)   SpO2 92%   BMI 30.41 kg/m                 Constitutional: Pt appears in NAD               HENT: Head: NCAT.                Right Ear: External ear normal.                 Left Ear: External ear  normal.                Eyes: . Pupils are equal, round, and reactive to light. Conjunctivae and EOM are normal               Nose: without d/c or deformity               Neck: Neck supple. Gross normal ROM               Cardiovascular: Normal rate and regular rhythm.                 Pulmonary/Chest: Effort normal and breath sounds without rales or wheezing.                Abd:  Soft, NT, ND, + BS, no organomegaly               Neurological: Pt is alert. At baseline orientation, motor grossly intact               Skin:  LE edema - none, left upper lateral arm at the deltoid with just over 1 cm nontender skin lesion ulcer appearing               Psychiatric: Pt behavior is normal without agitation   Micro: none  Cardiac tracings I have personally interpreted today:  none  Pertinent Radiological findings (summarize): none   Lab Results  Component Value Date   WBC 9.7 03/28/2022   HGB 14.4 03/28/2022   HCT 43.2 03/28/2022   PLT 206.0 03/28/2022   GLUCOSE 76 09/20/2021   CHOL 103 03/28/2022   TRIG 212.0 (H) 03/28/2022   HDL 31.80 (L) 03/28/2022   LDLDIRECT 48.0 03/28/2022   LDLCALC 58 09/20/2021   ALT 18 03/28/2022   AST 21 03/28/2022   NA 140 09/20/2021   K 5.2 No hemolysis seen (H) 09/20/2021   CL 103 09/20/2021   CREATININE 2.29 (H) 09/20/2021   BUN 38 (H) 09/20/2021   CO2 30 09/20/2021   TSH 1.48 03/28/2022   PSA 1.19 09/20/2021   INR 1.0 06/23/2021   HGBA1C 6.2 03/28/2022   MICROALBUR 0.8 07/15/2006   Assessment/Plan:  Fitzroy Mikami  Bodkins is a 62 y.o. White or Caucasian [1] male with  has a past medical history of Anemia, unspecified, Arthritis, Diverticulosis of colon (without mention of hemorrhage), Gastric ulcer with hemorrhage and obstruction (2011), GERD (gastroesophageal reflux disease), Hyperlipemia, Hypertension, Personal history of colonic polyps (01/30/2010), Sleep apnea, Stroke ([place]), and Substance abuse ([place]).  Vitamin D deficiency Last vitamin D Lab Results  Component Value Date   VD25OH 31.86 09/20/2021   Low to start oral replacement   Essential hypertension BP Readings from Last 3 Encounters:  03/28/22 122/76  09/20/21 (!) 162/100  09/05/21 126/82   Stable, pt to continue medical treatment norvasc 5 qd, coreg 25 bid, entersto, lasix   HLD (hyperlipidemia) Lab Results  Component Value Date   LDLCALC 58 09/20/2021   Stable, pt to continue current statin lipitor 80 mg qd   CKD (chronic kidney disease) stage 4, GFR 15-29 ml/min (HCC) Lab Results  Component Value Date   CREATININE 2.29 (H) 09/20/2021   Stable overall, cont to avoid nephrotoxins   Diabetes ([place]) Lab Results  Component Value Date   HGBA1C 6.2 03/28/2022   Stable, pt to continue current medical treatment jardiance 10 mg qd   Skin lesion of left arm C/w with probable skin ca - for dermatology referral asap  Followup: No follow-ups on file.  [name], MD 03/29/2022 9:34 PM [facility]

## 2022-03-28 NOTE — Patient Instructions (Signed)
You will be contacted regarding the referral for: Dermatology  Please continue all other medications as before, and refills have been done if requested.  Please have the pharmacy call with any other refills you may need.  Please continue your efforts at being more active, low cholesterol diet, and weight control.  Please keep your appointments with your specialists as you may have planned  Please go to the LAB at the blood drawing area for the tests to be done  You will be contacted by phone if any changes need to be made immediately.  Otherwise, you will receive a letter about your results with an explanation, but please check with MyChart first.  Please remember to sign up for MyChart if you have not done so, as this will be important to you in the future with finding out test results, communicating by private email, and scheduling acute appointments online when needed.  Please make an Appointment to return in 6 months, or sooner if needed

## 2022-03-28 NOTE — Assessment & Plan Note (Signed)
Last vitamin D Lab Results  Component Value Date   VD25OH 31.86 09/20/2021   Low to start oral replacement

## 2022-03-29 DIAGNOSIS — L989 Disorder of the skin and subcutaneous tissue, unspecified: Secondary | ICD-10-CM | POA: Insufficient documentation

## 2022-03-29 LAB — URINALYSIS, ROUTINE W REFLEX MICROSCOPIC
Bilirubin Urine: NEGATIVE
Hgb urine dipstick: NEGATIVE
Ketones, ur: NEGATIVE
Leukocytes,Ua: NEGATIVE
Nitrite: NEGATIVE
Specific Gravity, Urine: 1.02 (ref 1.000–1.030)
Total Protein, Urine: NEGATIVE
Urine Glucose: >=1000 — AB
Urobilinogen, UA: 0.2 (ref 0.0–1.0)
pH: 5.5 (ref 5.0–8.0)

## 2022-03-29 LAB — CBC WITH DIFFERENTIAL/PLATELET
Basophils Absolute: 0.1 10*3/uL (ref 0.0–0.1)
Basophils Relative: 1.4 % (ref 0.0–3.0)
Eosinophils Absolute: 0.2 10*3/uL (ref 0.0–0.7)
Eosinophils Relative: 1.8 % (ref 0.0–5.0)
HCT: 43.2 % (ref 39.0–52.0)
Hemoglobin: 14.4 g/dL (ref 13.0–17.0)
Lymphocytes Relative: 20.2 % (ref 12.0–46.0)
Lymphs Abs: 2 10*3/uL (ref 0.7–4.0)
MCHC: 33.4 g/dL (ref 30.0–36.0)
MCV: 94 fl (ref 78.0–100.0)
Monocytes Absolute: 1 10*3/uL (ref 0.1–1.0)
Monocytes Relative: 9.8 % (ref 3.0–12.0)
Neutro Abs: 6.5 10*3/uL (ref 1.4–7.7)
Neutrophils Relative %: 66.8 % (ref 43.0–77.0)
Platelets: 206 10*3/uL (ref 150.0–400.0)
RBC: 4.59 Mil/uL (ref 4.22–5.81)
RDW: 14.1 % (ref 11.5–15.5)
WBC: 9.7 10*3/uL (ref 4.0–10.5)

## 2022-03-29 LAB — LIPID PANEL
Cholesterol: 103 mg/dL (ref 0–200)
HDL: 31.8 mg/dL — ABNORMAL LOW (ref >39.00)
NonHDL: 71.69
Total CHOL/HDL Ratio: 3
Triglycerides: 212 mg/dL — ABNORMAL HIGH (ref 0.0–149.0)
VLDL: 42.4 mg/dL — ABNORMAL HIGH (ref 0.0–40.0)

## 2022-03-29 LAB — HEPATIC FUNCTION PANEL
ALT: 18 U/L (ref 0–53)
AST: 21 U/L (ref 0–37)
Albumin: 4 g/dL (ref 3.5–5.2)
Alkaline Phosphatase: 97 U/L (ref 39–117)
Bilirubin, Direct: 0.1 mg/dL (ref 0.0–0.3)
Total Bilirubin: 0.5 mg/dL (ref 0.2–1.2)
Total Protein: 6.6 g/dL (ref 6.0–8.3)

## 2022-03-29 LAB — VITAMIN D 25 HYDROXY (VIT D DEFICIENCY, FRACTURES): VITD: 28.92 ng/mL — ABNORMAL LOW (ref 30.00–100.00)

## 2022-03-29 LAB — HEMOGLOBIN A1C: Hgb A1c MFr Bld: 6.2 % (ref 4.6–6.5)

## 2022-03-29 LAB — TSH: TSH: 1.48 u[IU]/mL (ref 0.35–5.50)

## 2022-03-29 LAB — LDL CHOLESTEROL, DIRECT: Direct LDL: 48 mg/dL

## 2022-03-29 NOTE — Assessment & Plan Note (Signed)
Lab Results  Component Value Date   HGBA1C 6.2 03/28/2022   Stable, pt to continue current medical treatment jardiance 10 mg qd

## 2022-03-29 NOTE — Assessment & Plan Note (Addendum)
C/w with probable skin ca - for dermatology referral asap

## 2022-03-29 NOTE — Assessment & Plan Note (Signed)
Lab Results  Component Value Date   CREATININE 2.29 (H) 09/20/2021   Stable overall, cont to avoid nephrotoxins

## 2022-03-29 NOTE — Assessment & Plan Note (Signed)
BP Readings from Last 3 Encounters:  03/28/22 122/76  09/20/21 (!) 162/100  09/05/21 126/82   Stable, pt to continue medical treatment norvasc 5 qd, coreg 25 bid, entersto, lasix

## 2022-03-29 NOTE — Assessment & Plan Note (Signed)
Lab Results  Component Value Date   LDLCALC 58 09/20/2021   Stable, pt to continue current statin lipitor 80 mg qd

## 2022-03-31 LAB — PTH, INTACT AND CALCIUM
Calcium: 8.9 mg/dL (ref 8.6–10.3)
PTH: 72 pg/mL (ref 16–77)

## 2022-04-09 NOTE — Progress Notes (Unsigned)
Cardiology Office Note   Date:  04/10/2022   ID:  Martin Archer, DOB 1960/09/28, MRN [id number]  PCP:  Biagio Borg, MD    Chief Complaint  Patient presents with   Follow-up    Seeing nephrologist for left kidney issues   CAD  Wt Readings from Last 3 Encounters:  04/10/22 197 lb (89.4 kg)  03/28/22 200 lb (90.7 kg)  09/20/21 190 lb (86.2 kg)       History of Present Illness: Martin Archer is a 61 y.o. male    who is being seen today for the evaluation of HTN and abnormal echo at the request of Biagio Borg, MD.   Prior h/o anemia, GI bleed, ulcer, CKD.  Lisinopril was increased in early 2022 for BP control.    11/2020 echo showed: "Left ventricular ejection fraction, by estimation, is 40 to 45%. The  left ventricle has mildly decreased function. The left ventricle  demonstrates global hypokinesis. The left ventricular internal cavity size  was moderately dilated. Left ventricular  diastolic parameters are consistent with Grade III diastolic dysfunction  (restrictive). Elevated left ventricular end-diastolic pressure.   2. Right ventricular systolic function is normal. The right ventricular  size is normal. There is mildly elevated pulmonary artery systolic  pressure. The estimated right ventricular systolic pressure is 78.2 mmHg.   3. Left atrial size was severely dilated.   4. Right atrial size was severely dilated.   5. The mitral valve is normal in structure. Mild mitral valve  regurgitation. No evidence of mitral stenosis.   6. The aortic valve has an indeterminant number of cusps. Aortic valve  regurgitation is not visualized. Mild to moderate aortic valve  sclerosis/calcification is present, without any evidence of aortic  stenosis.   7. Aortic dilatation noted. There is mild dilatation of the aortic root,  measuring 38 mm. There is mild dilatation of the ascending aorta,  measuring 43 mm.   8. The inferior vena cava is normal in size with  greater than 50%  respiratory variability, suggesting right atrial pressure of 3 mmHg.   9. Compared to prior echo, LVF has declined."   He was having fatigue over the past several months.    He thinks he was told his heart was weak in the past. Records show from [id number]: "Acute systolic congestive heart failure with ejection fraction of 40%. Continue lisinopril and metoprolol. Continue low-dose oral Lasix. Seen by cardiology. Follow-up with cardiology at [facility] at discharge. Patient will need further evaluation in the form of a repeat echo and/ or coronary angiogram. Also had elevated troponin which was likely noncardiac."  He was having alcohol abuse issues at that time.    Cath in June 2022 showed: "Prox RCA lesion is 100% stenosed. Left to right collaterals. 1st Diag lesion is 25% stenosed. 3rd Mrg lesion is 70% stenosed. LV end diastolic pressure is normal. There is no aortic valve stenosis. Hemodynamic findings consistent with mild pulmonary hypertension. Ao sat 94%, PA sat 67%, mean PA pressure 27 mm Hg; mean PCWP 13 mm Hg; CO 4.6 L/min; CI 2.26   LV dysfuction out of proportion to the degree of CAD.   Medical therapy for heart failure. "   For severe left arm claudication, he had PAD eval with Dr. Fletcher Anon: " VBX covered stent placement to the left subclavian artery.  In addition, he was noted to have flush occlusion of the right SFA....  follow-up carotid Doppler showed patent left  subclavian artery stenosis.  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  However, there was significant progression of right ICA stenosis to greater than 80%."  The patient underwent redo carotid endarterectomy in November 2022 by Dr. Scot Dock.  Reports some right leg blockage as well which may need revasc.   Denies : Chest pain. Dizziness.  Nitroglycerin use. Orthopnea. Palpitations. Paroxysmal nocturnal dyspnea. Shortness of breath. Syncope.    Rare leg edema.  Past Medical History:  Diagnosis Date    Anemia, unspecified    Arthritis    Diverticulosis of colon (without mention of hemorrhage)    Gastric ulcer with hemorrhage and obstruction 2011   Dr [facility]   GERD (gastroesophageal reflux disease)    hx   Hyperlipemia    Hypertension    Personal history of colonic polyps 01/30/2010   hyperplastic rectal   Sleep apnea    on CPAP;  Sleep Medicine   Stroke [facility])    mini strokes-bilateral carotid endarterectomy   Substance abuse ([place])    History of alcohol abuse    Past Surgical History:  Procedure Laterality Date   AORTIC ARCH ANGIOGRAPHY N/A 04/26/2021   Procedure: AORTIC ARCH ANGIOGRAPHY;  Surgeon: Wellington Hampshire, MD;  Location: [place] [facility];  Service: Cardiovascular;  Laterality: N/A;   CARDIAC CATHETERIZATION  01/25/2021   CAROTID ENDARTERECTOMY  11/2006   left Dr Mamie Nick   COLONOSCOPY  2011   DP-positive FOBT/IDA   ENDARTERECTOMY Right 01/19/2015   Procedure: RIGHT CAROTID ENDARTERECTOMY WITH PATCH ANGIOPLASTY;  Surgeon: Mal Misty, MD;  Location: [place];  Service: Vascular;  Laterality: Right;   ENDARTERECTOMY Right 06/27/2021   Procedure: RIGHT REDO CAROTID ARTERY ENDARTERECTOMY & PLACEMENT OF 15Fr DRAIN;  Surgeon: Angelia Mould, MD;  Location: [place];  Service: Vascular;  Laterality: Right;   ESOPHAGOGASTRODUODENOSCOPY Left 06/17/2013   Procedure: ESOPHAGOGASTRODUODENOSCOPY (EGD);  Surgeon: Arta Silence, MD;  Location: [facility] ENDOSCOPY;  Service: Endoscopy;  Laterality: Left;   ESOPHAGOGASTRODUODENOSCOPY N/A 09/20/2015   Procedure: ESOPHAGOGASTRODUODENOSCOPY (EGD);  Surgeon: Ladene Artist, MD;  Location: [facility] ENDOSCOPY;  Service: Endoscopy;  Laterality: N/A;   FOOT SURGERY Bilateral 2020   Taylor's foot   INGUINAL HERNIA REPAIR  1963   NASAL FRACTURE SURGERY  1974   PATCH ANGIOPLASTY Right 06/27/2021   Procedure: PATCH ANGIOPLASTY USING Rueben Bash BIOLOGIC PATCH;  Surgeon: Angelia Mould, MD;  Location: [place];  Service:  Vascular;  Laterality: Right;   PERIPHERAL VASCULAR INTERVENTION  04/26/2021   Procedure: PERIPHERAL VASCULAR INTERVENTION;  Surgeon: Wellington Hampshire, MD;  Location: [place] [facility];  Service: Cardiovascular;;   RIGHT/LEFT HEART CATH AND CORONARY ANGIOGRAPHY N/A 01/25/2021   Procedure: RIGHT/LEFT HEART CATH AND CORONARY ANGIOGRAPHY;  Surgeon: Jettie Booze, MD;  Location: [place] [facility];  Service: Cardiovascular;  Laterality: N/A;   TEE WITHOUT CARDIOVERSION N/A 01/17/2015   Procedure: TRANSESOPHAGEAL ECHOCARDIOGRAM (TEE);  Surgeon: Sanda Klein, MD;  Location: [facility] ENDOSCOPY;  Service: Cardiovascular;  Laterality: N/A;   WISDOM TOOTH EXTRACTION       Current Outpatient Medications  Medication Sig Dispense Refill   acetaminophen (TYLENOL) 500 MG tablet Take 500 mg by mouth every 6 (six) hours as needed.     amLODipine (NORVASC) 10 MG tablet Take 10 mg by mouth daily.     aspirin EC 81 MG tablet Take 1 tablet (81 mg total) by mouth daily. Swallow whole. 30 tablet 11   atorvastatin (LIPITOR) 80 MG tablet TAKE 1 TABLET  BY MOUTH DAILY 90 tablet 3   Carboxymethylcellul-Glycerin (LUBRICATING EYE DROPS OP) Place 1 drop into both eyes daily as needed (dry eyes).     carvedilol (COREG) 25 MG tablet Take 1 tablet (25 mg total) by mouth 2 (two) times daily. 180 tablet 3   clopidogrel (PLAVIX) 75 MG tablet TAKE 1 TABLET BY MOUTH DAILY WITH BREAKFAST. 90 tablet 3   doxylamine, Sleep, (UNISOM) 25 MG tablet Take 25 mg by mouth at bedtime as needed for sleep.     empagliflozin (JARDIANCE) 10 MG TABS tablet Take 1 tablet (10 mg total) by mouth daily before breakfast. 90 tablet 3   ENTRESTO 97-103 MG TAKE 1 TABLET BY MOUTH TWICE DAILY 60 tablet 0   ezetimibe (ZETIA) 10 MG tablet Take 1 tablet (10 mg total) by mouth daily. 90 tablet 1   furosemide (LASIX) 20 MG tablet Take 1 tablet (20 mg total) by mouth daily as needed. For lower extremity swelling (Patient taking differently: Take 20 mg by  mouth daily as needed for fluid or edema.) 90 tablet 3   Multiple Vitamin (MULTIVITAMIN WITH MINERALS) TABS tablet Take 1 tablet by mouth daily.     No current facility-administered medications for this visit.    Allergies:   Adderall [amphetamine-dextroamphet er], Prozac [fluoxetine hcl], Sertraline hcl, and Advil [ibuprofen]    Social History:  The patient  reports that he quit smoking about 6 years ago. His smoking use included cigarettes. He smoked an average of .5 packs per day. He has never used smokeless tobacco. He reports that he does not currently use alcohol. He reports that he does not use drugs.   Family History:  The patient's family history includes Cancer in his maternal grandfather; Diabetes in his father and paternal grandfather; Heart attack in his mother; Heart attack (age of onset: 3) in his brother; Heart attack (age of onset: 86) in his maternal grandfather; Hypertension in his father; Stroke in his father and paternal uncle.    ROS:  Please see the history of present illness.   Otherwise, review of systems are positive for being sober for 5 years.   All other systems are reviewed and negative.    PHYSICAL EXAM: VS:  BP (!) 142/104   Pulse 74   Ht 5\' 8"  (1.727 m)   Wt 197 lb (89.4 kg)   SpO2 97%   BMI 29.95 kg/m  , BMI Body mass index is 29.95 kg/m. GEN: Well nourished, well developed, in no acute distress HEENT: normal Neck: no JVD, carotid bruits, or masses Cardiac: RRR; no murmurs, rubs, or gallops,no edema  Respiratory:  clear to auscultation bilaterally, normal work of breathing GI: soft, nontender, nondistended, + BS MS: no deformity or atrophy Skin: warm and dry, no rash Neuro:  Strength and sensation are intact Psych: euthymic mood, full affect   EKG:   The ekg ordered today demonstrates NSR, nonspecific ST-T wave changes, no change from prior ECG in 2022.    Recent Labs: 09/20/2021: BUN 38; Creatinine, Ser 2.29; Potassium 5.2 No hemolysis  seen; Sodium 140 03/28/2022: ALT 18; Hemoglobin 14.4; Platelets 206.0; TSH 1.48   Lipid Panel    Component Value Date/Time   CHOL 103 03/28/2022 1629   CHOL 124 04/13/2021 1018   CHOL 286 (H) 10/12/2013 0436   TRIG 212.0 (H) 03/28/2022 1629   TRIG 572 (H) 10/12/2013 0436   TRIG 284 (HH) 07/15/2006 1158   HDL 31.80 (L) 03/28/2022 1629   HDL 35 (L) 04/13/2021 1018  HDL 35 (L) 10/12/2013 0436   CHOLHDL 3 03/28/2022 1629   VLDL 42.4 (H) 03/28/2022 1629   LDLCALC 58 09/20/2021 1126   LDLCALC 59 04/13/2021 1018   LDLCALC  03/23/2020 1601     Comment:     . LDL cholesterol not calculated. Triglyceride levels greater than 400 mg/dL invalidate calculated LDL results. . Reference range: <100 . Desirable range <100 mg/dL for primary prevention;   <70 mg/dL for patients with CHD or diabetic patients  with > or = 2 CHD risk factors. Marland Kitchen LDL-C is now calculated using the Martin-Hopkins  calculation, which is a validated novel method providing  better accuracy than the Friedewald equation in the  estimation of LDL-C.  Cresenciano Genre et al. Annamaria Helling. 3007;622(63): 2061-2068  (http://education.[facility].com/faq/FAQ164)    LDLCALC NOT CALC 10/12/2013 0436   LDLDIRECT 48.0 03/28/2022 1629     Other studies Reviewed: Additional studies/ records that were reviewed today with results demonstrating: Labs reviewed, LDL 58.   ASSESSMENT AND PLAN:  CAD: No angina on medical therapy.  Known CTO of the RCA.  COntinue aggressive secondary prevention.  Chronic systolic heart failure: Appears euvolemic.  He is on aggressive heart failure therapy.  Last creatinine on file is 2.2.  Would not add any further heart failure medicines such as spironolactone that could affect renal function. HTN: Amlodipine increased recently by renal per his report a few weeks ago.  Has not had morning meds yet this morning.  Check blood pressure readings at home after relaxing for at least 10 minutes.  Let us know via  MyChart regarding home blood pressure readings.   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   PAD: Managed with Dr. Fletcher Anon.  Hyperlipidemia: LDL 58.  Continue lipid-lowering therapy. CRI: Cr 2.23.  Tight BP controlled.  Left kidney atrophied. Followed with renal.  Avoid NSAIDs.  Stay well hydrated.   First grandchild expected in 10/2022.   Current medicines are reviewed at length with the patient today.  The patient concerns regarding his medicines were addressed.  The following changes have been made:  No change  Labs/ tests ordered today include:  No orders of the defined types were placed in this encounter.   Recommend 150 minutes/week of aerobic exercise Low fat, low carb, high fiber diet recommended  Disposition:   FU in 10 months; he will follow-up with Dr. Scot Dock in a few months and Dr. Fletcher Anon in Dec 2023.    Signed, Larae Grooms, MD  04/10/2022 9:45 AM    [address], [place], [place]  [address] Phone: [phone]; Fax: [phone]

## 2022-04-10 ENCOUNTER — Ambulatory Visit (INDEPENDENT_AMBULATORY_CARE_PROVIDER_SITE_OTHER): Payer: 59 | Admitting: Interventional Cardiology

## 2022-04-10 VITALS — BP 162/78 | HR 74 | Ht 68.0 in | Wt 197.0 lb

## 2022-04-10 DIAGNOSIS — I5022 Chronic systolic (congestive) heart failure: Secondary | ICD-10-CM

## 2022-04-10 DIAGNOSIS — N183 Chronic kidney disease, stage 3 unspecified: Secondary | ICD-10-CM

## 2022-04-10 DIAGNOSIS — E785 Hyperlipidemia, unspecified: Secondary | ICD-10-CM

## 2022-04-10 DIAGNOSIS — I739 Peripheral vascular disease, unspecified: Secondary | ICD-10-CM

## 2022-04-10 DIAGNOSIS — I25118 Atherosclerotic heart disease of native coronary artery with other forms of angina pectoris: Secondary | ICD-10-CM

## 2022-04-10 DIAGNOSIS — G4733 Obstructive sleep apnea (adult) (pediatric): Secondary | ICD-10-CM | POA: Insufficient documentation

## 2022-04-10 DIAGNOSIS — I10 Essential (primary) hypertension: Secondary | ICD-10-CM

## 2022-04-10 NOTE — Patient Instructions (Signed)
Medication Instructions:  Your physician recommends that you continue on your current medications as directed. Please refer to the Current Medication list given to you today.  *If you need a refill on your cardiac medications before your next appointment, please call your pharmacy*   Follow-Up: At [facility], you and your health needs are our priority.  As part of our continuing mission to provide you with exceptional heart care, we have created designated Provider Care Teams.  These Care Teams include your primary Cardiologist (physician) and Advanced Practice Providers (APPs -  Physician Assistants and Nurse Practitioners) who all work together to provide you with the care you need, when you need it.  We recommend signing up for the patient portal called "MyChart".  Sign up information is provided on this After Visit Summary.  MyChart is used to connect with patients for Virtual Visits (Telemedicine).  Patients are able to view lab/test results, encounter notes, upcoming appointments, etc.  Non-urgent messages can be sent to your provider as well.   To learn more about what you can do with MyChart, go to [email or web address].    Your next appointment:   10 month(s)  The format for your next appointment:   In Person  Provider:   Larae Grooms, MD {   Other Instructions Check BP at home and send readings in via [place]

## 2022-04-17 ENCOUNTER — Ambulatory Visit: Payer: 59 | Admitting: Cardiovascular Disease

## 2022-05-01 ENCOUNTER — Ambulatory Visit: Payer: 59 | Attending: Cardiovascular Disease | Admitting: Cardiovascular Disease

## 2022-05-01 ENCOUNTER — Encounter: Payer: Self-pay | Admitting: Cardiovascular Disease

## 2022-05-01 VITALS — BP 132/88 | HR 71 | Ht 68.0 in | Wt 197.8 lb

## 2022-05-01 DIAGNOSIS — I739 Peripheral vascular disease, unspecified: Secondary | ICD-10-CM

## 2022-05-01 DIAGNOSIS — I5022 Chronic systolic (congestive) heart failure: Secondary | ICD-10-CM | POA: Diagnosis not present

## 2022-05-01 DIAGNOSIS — I771 Stricture of artery: Secondary | ICD-10-CM | POA: Diagnosis not present

## 2022-05-01 DIAGNOSIS — I6523 Occlusion and stenosis of bilateral carotid arteries: Secondary | ICD-10-CM

## 2022-05-01 DIAGNOSIS — E785 Hyperlipidemia, unspecified: Secondary | ICD-10-CM | POA: Diagnosis not present

## 2022-05-01 NOTE — Patient Instructions (Signed)
Medication Instructions:  No changes *If you need a refill on your cardiac medications before your next appointment, please call your pharmacy*   Lab Work: None ordered If you have labs (blood work) drawn today and your tests are completely normal, you will receive your results only by: [place] (if you have MyChart) OR A paper copy in the mail If you have any lab test that is abnormal or we need to change your treatment, we will call you to review the results.   Testing/Procedures: None ordered   Follow-Up: At [facility], you and your health needs are our priority.  As part of our continuing mission to provide you with exceptional heart care, we have created designated Provider Care Teams.  These Care Teams include your primary Cardiologist (physician) and Advanced Practice Providers (APPs -  Physician Assistants and Nurse Practitioners) who all work together to provide you with the care you need, when you need it.  We recommend signing up for the patient portal called "MyChart".  Sign up information is provided on this After Visit Summary.  MyChart is used to connect with patients for Virtual Visits (Telemedicine).  Patients are able to view lab/test results, encounter notes, upcoming appointments, etc.  Non-urgent messages can be sent to your provider as well.   To learn more about what you can do with MyChart, go to [email or web address].    Your next appointment:   4 month(s)  The format for your next appointment:   In Person  Provider:   Dr. Fletcher Anon Other [place] (PAD)   General Information:   Research in vascular exercise has demonstrated remarkable improvement in symptoms of leg pain (claudication) without expensive or invasive interventions. Regular walking programs are extremely helpful for patients with PAD and intermittent claudication.  These steps are designed to help you get  started with a safe and effective program to help you walk farther with less pain:   Walk at least three times a week (preferably every day).  Your goal is to build up to 30-45 minutes of total walking time (not counting rest breaks). It may take you several weeks to build up your exercise time starting at 5-10 minutes or whatever you can tolerate.  Walk as far as possible using moderate to maximal pain (7-8 on the scale below) as a signal to stop, and resume walking when the pain goes away.  On a treadmill, set the speed and grade at a level that brings on the claudication pain within 3 to 5 minutes. Walk at this rate until you experience claudication of moderate severity, rest until the pain improves, and then resume walking.  Over time, you will be able to walk longer at the designated speed and grade; workload should then be increased until you develop the pain within 3 to 5 minutes once again.  This regimen will induce a significant benefit. Studies have demonstrated that participants may be able to walk up to three or four times farther and have less leg pain, within twelve weeks, by following this protocol.  Pain Scale    0_____1_____2_____3_____4_____5_____6_____7_____8_____9_____10   No Pain                                   Moderate Pain  Maximal Pain

## 2022-05-01 NOTE — Progress Notes (Signed)
Cardiology Office Note   Date:  05/03/2022   ID:  Martin Archer, DOB 07/28/1961, MRN [id number]  PCP:  Biagio Borg, MD  Cardiologist: Dr. Irish Lack  No chief complaint on file.      History of Present Illness: Martin Archer is a 61 y.o. 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   He has known history of essential hypertension, previous tobacco use, carotid artery disease status post bilateral endarterectomy, chronic systolic heart failure, history of anemia due to GI bleed and chronic kidney disease. He is not diabetic and quit smoking in 2021. He had an echocardiogram done in April of 2022 which showed an ejection fraction of 40 to 45% with global hypokinesis, severely dilated atria, mild mitral regurgitation mildly calcified aortic valve.  He underwent a right and left cardiac catheterization in June of 2022 which showed chronically occluded proximal right coronary artery with left-to-right collaterals and mild nonobstructive disease on the left side.  Medical therapy was recommended.  Carotid Doppler done in May of 2022 showed an occluded left carotid artery with moderate disease on the right side.  There was significant stenosis noted in the left subclavian artery with 37 mm of pressure difference between the arms and retrograde flow in the left vertebral artery.  The patient had highly symptomatic left arm claudication.  I proceeded with angiography in August of 2022 which showed severe left subclavian artery stenosis.  I performed successful VBX covered stent placement to the left subclavian artery.  In addition, he was noted to have flush occlusion of the right SFA.Marland Kitchen  Lower extremity Dopplers in August of 2022 showed an ABI of 0.62 on the right and 0.84 on the left.  Duplex showed borderline stenosis in the right external iliac artery with occluded right SFA.  On  the left, there was moderate SFA disease.  Upon follow-up carotid Doppler, the patient was noted to have patent left subclavian artery stent with chronically occluded left carotid artery and progression of right carotid stenosis to greater than 80%.  The patient underwent redo carotid endarterectomy in November of 2022 by Dr. Scot Dock.  Renal artery duplex in February 2023 showed no evidence of renal artery stenosis.  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  He reports worsening right leg claudication.  No left arm claudication.  No chest pain or worsening dyspnea.  He smokes cigars.  Past Medical History:  Diagnosis Date   Anemia, unspecified    Arthritis    Diverticulosis of colon (without mention of hemorrhage)    Gastric ulcer with hemorrhage and obstruction 2011   Dr Fidela [facility]   GERD (gastroesophageal reflux disease)    hx   Hyperlipemia    Hypertension    Personal history of colonic polyps 01/30/2010   hyperplastic rectal   Sleep apnea    on CPAP; [facility]   Stroke [facility])    mini strokes-bilateral carotid endarterectomy   Substance abuse ([place])    History of alcohol abuse    Past Surgical History:  Procedure Laterality Date   AORTIC ARCH ANGIOGRAPHY N/A 04/26/2021   Procedure: AORTIC ARCH ANGIOGRAPHY;  Surgeon: Wellington Hampshire, MD;  Location: [place] [facility];  Service: Cardiovascular;  Laterality: N/A;   CARDIAC CATHETERIZATION  01/25/2021   CAROTID ENDARTERECTOMY  11/2006   left Dr Mamie Nick   COLONOSCOPY  2011  DP-positive FOBT/IDA   ENDARTERECTOMY Right 01/19/2015   Procedure: RIGHT CAROTID ENDARTERECTOMY WITH PATCH ANGIOPLASTY;  Surgeon: Mal Misty, MD;  Location: [place];  Service: Vascular;  Laterality: Right;   ENDARTERECTOMY Right 06/27/2021   Procedure: RIGHT REDO CAROTID ARTERY ENDARTERECTOMY & PLACEMENT OF 15Fr DRAIN;  Surgeon: Angelia Mould, MD;  Location: [place];  Service: Vascular;   Laterality: Right;   ESOPHAGOGASTRODUODENOSCOPY Left 06/17/2013   Procedure: ESOPHAGOGASTRODUODENOSCOPY (EGD);  Surgeon: Arta Silence, MD;  Location: [facility] END[facility]COPY;  Service: Endoscopy;  Laterality: Left;   ESOPHAGOGASTRODUODENOSCOPY N/A 09/20/2015   Procedure: ESOPHAGOGASTRODUODENOSCOPY (EGD);  Surgeon: Ladene Artist, MD;  Location: [facility] ENDOSCOPY;  Service: Endoscopy;  Laterality: N/A;   FOOT SURGERY Bilateral 2020   Taylor's foot   INGUINAL HERNIA REPAIR  1963   NASAL FRACTURE SURGERY  1974   PATCH ANGIOPLASTY Right 06/27/2021   Procedure: PATCH ANGIOPLASTY USING Rueben Bash BIOLOGIC PATCH;  Surgeon: Angelia Mould, MD;  Location: [place];  Service: Vascular;  Laterality: Right;   PERIPHERAL VASCULAR INTERVENTION  04/26/2021   Procedure: PERIPHERAL VASCULAR INTERVENTION;  Surgeon: Wellington Hampshire, MD;  Location: [place] [facility];  Service: Cardiovascular;;   RIGHT/LEFT HEART CATH AND CORONARY ANGIOGRAPHY N/A 01/25/2021   Procedure: RIGHT/LEFT HEART CATH AND CORONARY ANGIOGRAPHY;  Surgeon: Jettie Booze, MD;  Location: [place] [facility];  Service: Cardiovascular;  Laterality: N/A;   TEE WITHOUT CARDIOVERSION N/A 01/17/2015   Procedure: TRANSESOPHAGEAL ECHOCARDIOGRAM (TEE);  Surgeon: Sanda Klein, MD;  Location: [facility] ENDOSCOPY;  Service: Cardiovascular;  Laterality: N/A;   WISDOM TOOTH EXTRACTION       Current Outpatient Medications  Medication Sig Dispense Refill   acetaminophen (TYLENOL) 500 MG tablet Take 500 mg by mouth every 6 (six) hours as needed.     amLODipine (NORVASC) 10 MG tablet Take 10 mg by mouth daily.     aspirin EC 81 MG tablet Take 1 tablet (81 mg total) by mouth daily. Swallow whole. 30 tablet 11   atorvastatin (LIPITOR) 80 MG tablet TAKE 1 TABLET BY MOUTH DAILY 90 tablet 3   Carboxymethylcellul-Glycerin (LUBRICATING EYE DROPS OP) Place 1 drop into both eyes daily as needed (dry eyes).     carvedilol (COREG) 25 MG tablet Take 1 tablet (25 mg total) by  mouth 2 (two) times daily. 180 tablet 3   clopidogrel (PLAVIX) 75 MG tablet TAKE 1 TABLET BY MOUTH DAILY WITH BREAKFAST. 90 tablet 3   doxylamine, Sleep, (UNISOM) 25 MG tablet Take 25 mg by mouth at bedtime as needed for sleep.     empagliflozin (JARDIANCE) 10 MG TABS tablet Take 1 tablet (10 mg total) by mouth daily before breakfast. 90 tablet 3   ENTRESTO 97-103 MG TAKE 1 TABLET BY MOUTH TWICE DAILY 60 tablet 0   ezetimibe (ZETIA) 10 MG tablet Take 1 tablet (10 mg total) by mouth daily. 90 tablet 1   furosemide (LASIX) 20 MG tablet Take 1 tablet (20 mg total) by mouth daily as needed. For lower extremity swelling (Patient taking differently: Take 20 mg by mouth daily as needed for fluid or edema.) 90 tablet 3   Multiple Vitamin (MULTIVITAMIN WITH MINERALS) TABS tablet Take 1 tablet by mouth daily.     No current facility-administered medications for this visit.    Allergies:   Adderall [amphetamine-dextroamphet er], Prozac [fluoxetine hcl], Sertraline hcl, and Advil [ibuprofen]    Social History:  The patient  reports that he quit smoking about 6 years ago. His smoking use included  cigarettes. He smoked an average of .5 packs per day. He has never used smokeless tobacco. He reports that he does not currently use alcohol. He reports that he does not use drugs.   Family History:  The patient's family history includes Cancer in his maternal grandfather; Diabetes in his father and paternal grandfather; Heart attack in his mother; Heart attack (age of onset: 11) in his brother; Heart attack (age of onset: 62) in his maternal grandfather; Hypertension in his father; Stroke in his father and paternal uncle.    ROS:  Please see the history of present illness.   Otherwise, review of systems are positive for none.   All other systems are reviewed and negative.    PHYSICAL EXAM: VS:  BP 132/88   Pulse 71   Ht 5\' 8"  (1.727 m)   Wt 197 lb 12.8 oz (89.7 kg)   SpO2 98%   BMI 30.08 kg/m  , BMI Body  mass index is 30.08 kg/m. GEN: Well nourished, well developed, in no acute distress  HEENT: normal  Neck: no JVD or masses.  Right carotid bruit Cardiac: RRR; no murmurs, rubs, or gallops,no edema  Respiratory:  clear to auscultation bilaterally, normal work of breathing GI: soft, nontender, nondistended, + BS MS: no deformity or atrophy  Skin: warm and dry, no rash Neuro:  Strength and sensation are intact Psych: euthymic mood, full affect Vascular: Femoral pulses +1 on the right and +2 on the left.   EKG:  EKG is not ordered today.   Recent Labs: 09/20/2021: BUN 38; Creatinine, Ser 2.29; Potassium 5.2 No hemolysis seen; Sodium 140 03/28/2022: ALT 18; Hemoglobin 14.4; Platelets 206.0; TSH 1.48    Lipid Panel    Component Value Date/Time   CHOL 103 03/28/2022 1629   CHOL 124 04/13/2021 1018   CHOL 286 (H) 10/12/2013 0436   TRIG 212.0 (H) 03/28/2022 1629   TRIG 572 (H) 10/12/2013 0436   TRIG 284 (HH) 07/15/2006 1158   HDL 31.80 (L) 03/28/2022 1629   HDL 35 (L) 04/13/2021 1018   HDL 35 (L) 10/12/2013 0436   CHOLHDL 3 03/28/2022 1629   VLDL 42.4 (H) 03/28/2022 1629   LDLCALC 58 09/20/2021 1126   LDLCALC 59 04/13/2021 1018   LDLCALC  03/23/2020 1601     Comment:     . LDL cholesterol not calculated. Triglyceride levels greater than 400 mg/dL invalidate calculated LDL results. . Reference range: <100 . Desirable range <100 mg/dL for primary prevention;   <70 mg/dL for patients with CHD or diabetic patients  with > or = 2 CHD risk factors. Marland Kitchen LDL-C is now calculated using the Martin-Hopkins  calculation, which is a validated novel method providing  better accuracy than the Friedewald equation in the  estimation of LDL-C.  Cresenciano Genre et al. Annamaria Helling. 0932;671(24): 2061-2068  (http://education.[employer].com/faq/FAQ164)    LDLCALC NOT CALC 10/12/2013 0436   LDLDIRECT 48.0 03/28/2022 1629      Wt Readings from Last 3 Encounters:  05/01/22 197 lb 12.8 oz (89.7 kg)   04/10/22 197 lb (89.4 kg)  03/28/22 200 lb (90.7 kg)           No data to display            ASSESSMENT AND PLAN:  1.  Left subclavian artery stenosis: Status post successful covered stent placement .  No recurrent left arm claudication and left radial pulse is palpable.  Continue long-term dual antiplatelet therapy as tolerated given his extensive cardiovascular disease.  2.  Carotid artery stenosis: Occluded left carotid artery.  Status post recent redo right carotid endarterectomy.    3.  Coronary artery disease involving native coronary arteries without angina: He has no anginal symptoms.  Continue medical therapy.    4.  Chronic systolic heart failure: He appears to be euvolemic and currently on carvedilol, Entresto and Jardiance.  5.  Peripheral arterial disease: The patient has evidence of bilateral leg claudication worse on the right side.  His right SFA is occluded.  He does have borderline right external iliac artery stenosis which might be treatable.  However, for now, I favor continued medical therapy and trying an exercise program.   I discussed with him the importance of starting a walking exercise program and provided him with instructions.  6.  Hyperlipidemia: Continue high-dose atorvastatin and Zetia.  I reviewed most recent lipid profile which showed an LDL of 58.  7.  Chronic kidney disease: Most recent GFR was 29.   Disposition:   FU with me in 4  months  Signed,  Kathlyn Sacramento, MD  05/03/2022 12:21 PM    [facility]

## 2022-05-07 ENCOUNTER — Other Ambulatory Visit: Payer: Self-pay | Admitting: Cardiovascular Disease

## 2022-05-07 NOTE — Telephone Encounter (Signed)
Refill Request.  

## 2022-05-20 ENCOUNTER — Other Ambulatory Visit: Payer: Self-pay | Admitting: Interventional Cardiology

## 2022-06-01 ENCOUNTER — Other Ambulatory Visit: Payer: Self-pay | Admitting: Interventional Cardiology

## 2022-06-07 ENCOUNTER — Other Ambulatory Visit: Payer: Self-pay | Admitting: Interventional Cardiology

## 2022-06-13 ENCOUNTER — Other Ambulatory Visit: Payer: Self-pay | Admitting: Interventional Cardiology

## 2022-06-13 MED ORDER — EMPAGLIFLOZIN 10 MG PO TABS: 10.0000 mg | ORAL_TABLET | Freq: Every day | ORAL | 3 refills | Status: DC

## 2022-07-28 ENCOUNTER — Other Ambulatory Visit: Payer: Self-pay | Admitting: Interventional Cardiology

## 2022-08-02 ENCOUNTER — Other Ambulatory Visit: Payer: Self-pay | Admitting: *Deleted

## 2022-08-02 DIAGNOSIS — I6523 Occlusion and stenosis of bilateral carotid arteries: Secondary | ICD-10-CM

## 2022-08-08 ENCOUNTER — Ambulatory Visit (HOSPITAL_COMMUNITY)
Admission: RE | Admit: 2022-08-08 | Discharge: 2022-08-08 | Disposition: A | Discharge status: Home / Self Care | Payer: 59 | Source: Ambulatory Visit | Attending: Vascular Surgery | Admitting: Vascular Surgery

## 2022-08-08 DIAGNOSIS — I6523 Occlusion and stenosis of bilateral carotid arteries: Secondary | ICD-10-CM | POA: Diagnosis not present

## 2022-08-09 ENCOUNTER — Ambulatory Visit: Payer: 59 | Admitting: Vascular Surgery

## 2022-08-23 ENCOUNTER — Encounter: Payer: Self-pay | Admitting: Vascular Surgery

## 2022-08-23 ENCOUNTER — Ambulatory Visit (INDEPENDENT_AMBULATORY_CARE_PROVIDER_SITE_OTHER): Payer: 59 | Admitting: Vascular Surgery

## 2022-08-23 VITALS — BP 119/81 | HR 74 | Temp 98.0°F | Resp 20 | Ht 68.0 in | Wt 206.0 lb

## 2022-08-23 DIAGNOSIS — I6523 Occlusion and stenosis of bilateral carotid arteries: Secondary | ICD-10-CM

## 2022-08-23 NOTE — Progress Notes (Signed)
REASON FOR VISIT:   Follow-up of carotid disease.  MEDICAL ISSUES:   S/P REDO RIGHT CAROTID ENDARTERECTOMY: This patient had a redo right carotid endarterectomy in November 2022.  His duplex scan shows that his right carotid is widely patent.  He has a known left carotid occlusion.  He will need yearly duplex scans.  He is followed by Martin. Fletcher Archer with his peripheral arterial disease.  He is on aspirin, Plavix, and a statin.  I will send Martin. Fletcher Archer a message to see if he can arrange for his yearly follow-up duplex scans at his office to save Martin Archer a visit here.  Certainly if he develops any recurrent stenosis on the right would be happy to see him back at any time.  HPI:   Martin Archer is a pleasant 61 y.o. male who I last saw on 07/13/2021.  He had previously undergone bilateral carotid endarterectomies by Martin. Kellie Archer.  He had a known chronic occlusion of the left carotid and presented with a greater than 80% right carotid stenosis.  He underwent a redo right carotid endarterectomy on 06/27/2021.  The plaque extended very high and very low.  Fortunately he has done well.  He comes in for routine 1 year follow-up visit.  Since I saw him last, he denies any history of stroke, TIAs, expressive or receptive aphasia, or amaurosis fugax.  He is on aspirin, Plavix, and a statin.  Past Medical History:  Diagnosis Date   Anemia, unspecified    Arthritis    Diverticulosis of colon (without mention of hemorrhage)    Gastric ulcer with hemorrhage and obstruction 2011   [facility]   GERD (gastroesophageal reflux disease)    hx   Hyperlipemia    Hypertension    Personal history of colonic polyps 01/30/2010   hyperplastic rectal   Sleep apnea    on CPAP; [facility]   Stroke [facility])    mini strokes-bilateral carotid endarterectomy   Substance abuse ([place])    History of alcohol abuse    Family History  Problem Relation Age of Onset   Diabetes Father     Hypertension Father    Stroke Father        in late 26s   Heart attack Mother        in 77s   Heart attack Brother 60   Diabetes Paternal Grandfather    Cancer Maternal Grandfather        unknown type   Heart attack Maternal Grandfather 76   Stroke Paternal Uncle        in late 12s   Colon cancer Neg Hx    Esophageal cancer Neg Hx    Stomach cancer Neg Hx    Colon polyps Neg Hx    Rectal cancer Neg Hx     SOCIAL HISTORY: Social History   Tobacco Use   Smoking status: Former    Packs/day: 0.50    Types: Cigarettes    Quit date: 06/22/2015    Years since quitting: 7.1   Smokeless tobacco: Never   Tobacco comments:    smoked 1976-2007 & 2009- present , up to 2 ppd, average > 1ppd  Substance Use Topics   Alcohol use: Not Currently    Alcohol/week: 0.0 standard drinks of alcohol    Allergies  Allergen Reactions   Adderall [Amphetamine-Dextroamphet Er] Diarrhea   Prozac [Fluoxetine Hcl] Diarrhea   Sertraline Hcl Diarrhea   Advil [Ibuprofen] Diarrhea    Current  Outpatient Medications  Medication Sig Dispense Refill   acetaminophen (TYLENOL) 500 MG tablet Take 500 mg by mouth every 6 (six) hours as needed.     amLODipine (NORVASC) 10 MG tablet Take 10 mg by mouth daily.     aspirin EC 81 MG tablet Take 1 tablet (81 mg total) by mouth daily. Swallow whole. 30 tablet 11   atorvastatin (LIPITOR) 80 MG tablet TAKE 1 TABLET BY MOUTH DAILY 90 tablet 3   Carboxymethylcellul-Glycerin (LUBRICATING EYE DROPS OP) Place 1 drop into both eyes daily as needed (dry eyes).     carvedilol (COREG) 25 MG tablet TAKE 1 TABLET(25 MG) BY MOUTH TWICE DAILY 180 tablet 3   clopidogrel (PLAVIX) 75 MG tablet TAKE 1 TABLET BY MOUTH DAILY WITH BREAKFAST 90 tablet 3   doxylamine, Sleep, (UNISOM) 25 MG tablet Take 25 mg by mouth at bedtime as needed for sleep.     empagliflozin (JARDIANCE) 10 MG TABS tablet Take 1 tablet (10 mg total) by mouth daily before breakfast. 90 tablet 3   ENTRESTO 97-103 MG  TAKE 1 TABLET BY MOUTH TWICE DAILY 60 tablet 0   ezetimibe (ZETIA) 10 MG tablet TAKE 1 TABLET(10 MG) BY MOUTH DAILY 90 tablet 3   furosemide (LASIX) 20 MG tablet TAKE 1 TABLET(20 MG) BY MOUTH DAILY AS NEEDED FOR LOWER EXTREMITY SWELLING 90 tablet 3   Multiple Vitamin (MULTIVITAMIN WITH MINERALS) TABS tablet Take 1 tablet by mouth daily.     No current facility-administered medications for this visit.    REVIEW OF SYSTEMS:  [X]  denotes positive finding, [ ]  denotes negative finding Cardiac  Comments:  Chest pain or chest pressure:    Shortness of breath upon exertion:    Short of breath when lying flat:    Irregular heart rhythm:        Vascular    Pain in calf, thigh, or hip brought on by ambulation: x   Pain in feet at night that wakes you up from your sleep:     Blood clot in your veins:    Leg swelling:         Pulmonary    Oxygen at home:    Productive cough:     Wheezing:         Neurologic    Sudden weakness in arms or legs:     Sudden numbness in arms or legs:     Sudden onset of difficulty speaking or slurred speech:    Temporary loss of vision in one eye:     Problems with dizziness:         Gastrointestinal    Blood in stool:     Vomited blood:         Genitourinary    Burning when urinating:     Blood in urine:        Psychiatric    Major depression:         Hematologic    Bleeding problems:    Problems with blood clotting too easily:        Skin    Rashes or ulcers:        Constitutional    Fever or chills:     PHYSICAL EXAM:   Vitals:   08/23/22 0939 08/23/22 0941  BP: 130/85 119/81  Pulse: 74   Resp: 20   Temp: 98 F (36.7 C)   SpO2: 97%   Weight: 206 lb (93.4 kg)   Height: 5\' 8"  (1.727 m)  GENERAL: The patient is a well-nourished male, in no acute distress. The vital signs are documented above. CARDIAC: There is a regular rate and rhythm.  VASCULAR: He has a right carotid bruit. He has palpable femoral pulses. I cannot palpate  pedal pulses.  Both feet however are warm and well-perfused. He has no significant lower extremity swelling. PULMONARY: There is good air exchange bilaterally without wheezing or rales. ABDOMEN: Soft and non-tender with normal pitched bowel sounds.  MUSCULOSKELETAL: There are no major deformities or cyanosis. NEUROLOGIC: No focal weakness or paresthesias are detected. SKIN: There are no ulcers or rashes noted. PSYCHIATRIC: The patient has a normal affect.  DATA:    CAROTID DUPLEX: I have reviewed his carotid duplex scan that was done on 08/08/2022.  On the right side he has no evidence of recurrent carotid stenosis.  The right vertebral artery is patent with antegrade flow.  On the left side he has a known carotid occlusion.  The left vertebral artery is patent with antegrade flow.  His left subclavian stent is patent.  There is no evidence of restenosis.  [name] [facility] [phone]

## 2022-08-24 ENCOUNTER — Other Ambulatory Visit: Payer: Self-pay | Admitting: *Deleted

## 2022-08-24 DIAGNOSIS — I6523 Occlusion and stenosis of bilateral carotid arteries: Secondary | ICD-10-CM

## 2022-09-12 ENCOUNTER — Other Ambulatory Visit: Payer: Self-pay | Admitting: Interventional Cardiology

## 2022-09-25 ENCOUNTER — Ambulatory Visit: Payer: 59 | Attending: Cardiovascular Disease | Admitting: Cardiovascular Disease

## 2022-09-25 ENCOUNTER — Encounter: Payer: Self-pay | Admitting: Cardiovascular Disease

## 2022-09-25 VITALS — BP 128/78 | HR 82 | Ht 68.0 in | Wt 206.4 lb

## 2022-09-25 DIAGNOSIS — I5022 Chronic systolic (congestive) heart failure: Secondary | ICD-10-CM

## 2022-09-25 DIAGNOSIS — I739 Peripheral vascular disease, unspecified: Secondary | ICD-10-CM

## 2022-09-25 DIAGNOSIS — I6523 Occlusion and stenosis of bilateral carotid arteries: Secondary | ICD-10-CM

## 2022-09-25 DIAGNOSIS — I2510 Atherosclerotic heart disease of native coronary artery without angina pectoris: Secondary | ICD-10-CM | POA: Diagnosis not present

## 2022-09-25 DIAGNOSIS — E785 Hyperlipidemia, unspecified: Secondary | ICD-10-CM

## 2022-09-25 DIAGNOSIS — I771 Stricture of artery: Secondary | ICD-10-CM

## 2022-09-25 NOTE — Patient Instructions (Signed)
Medication Instructions:  No changes *If you need a refill on your cardiac medications before your next appointment, please call your pharmacy*   Lab Work: None ordered If you have labs (blood work) drawn today and your tests are completely normal, you will receive your results only by: MyChart Message (if you have MyChart) OR A paper copy in the mail If you have any lab test that is abnormal or we need to change your treatment, we will call you to review the results.   Testing/Procedures: None ordered   Follow-Up: At [facility], you and your health needs are our priority.  As part of our continuing mission to provide you with exceptional heart care, we have created designated Provider Care Teams.  These Care Teams include your primary Cardiologist (physician) and Advanced Practice Providers (APPs -  Physician Assistants and Nurse Practitioners) who all work together to provide you with the care you need, when you need it.  We recommend signing up for the patient portal called "MyChart".  Sign up information is provided on this After Visit Summary.  MyChart is used to connect with patients for Virtual Visits (Telemedicine).  Patients are able to view lab/test results, encounter notes, upcoming appointments, etc.  Non-urgent messages can be sent to your provider as well.   To learn more about what you can do with MyChart, go to [email or web address].    Your next appointment:   12 month(s)  Provider:   Dr. Arida 

## 2022-09-25 NOTE — Progress Notes (Unsigned)
Cardiology Office Note   Date:  09/26/2022   ID:  Martin Archer, DOB Jan 18, 1961, MRN [id number]  PCP:  Martin Borg, MD  Cardiologist: Dr. Irish Archer  No chief complaint on file.      History of Present Illness: Martin Archer is a 62 y.o. 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   He has known history of essential hypertension, previous tobacco use, carotid artery disease status post bilateral endarterectomy, chronic systolic heart failure, history of anemia due to GI bleed and chronic kidney disease. He is not diabetic and quit smoking in 2021. He had an echocardiogram done in April of 2022 which showed an ejection fraction of 40 to 45% with global hypokinesis, severely dilated atria, mild mitral regurgitation mildly calcified aortic valve.  He underwent a right and left cardiac catheterization in June of 2022 which showed chronically occluded proximal right coronary artery with left-to-right collaterals and mild nonobstructive disease on the left side.  Medical therapy was recommended.  Carotid Doppler done in May of 2022 showed an occluded left carotid artery with moderate disease on the right side.  There was significant stenosis noted in the left subclavian artery with 37 mm of pressure difference between the arms and retrograde flow in the left vertebral artery.  The patient had highly symptomatic left arm claudication.  I proceeded with angiography in August of 2022 which showed severe left subclavian artery stenosis.  I performed successful VBX covered stent placement to the left subclavian artery.  In addition, he was noted to have flush occlusion of the right SFA.  Lower extremity Dopplers in August of 2022 showed an ABI of 0.62 on the right and 0.84 on the left.  Duplex showed borderline stenosis in the right external iliac artery with occluded right SFA.  On  the left, there was moderate SFA disease.  Upon follow-up carotid Doppler, the patient was noted to have patent left subclavian artery stent with chronically occluded left carotid artery and progression of right carotid stenosis to greater than 80%.  The patient underwent redo carotid endarterectomy in November of 2022 by Dr. Scot Archer.  Renal artery duplex in February 2023 showed no evidence of renal artery stenosis.  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  He reports stable bilateral calf claudication and has been going to the gym every other day with improvement in symptoms overall.  No chest pain or shortness of breath.  Past Medical History:  Diagnosis Date   Anemia, unspecified    Arthritis    Diverticulosis of colon (without mention of hemorrhage)    Gastric ulcer with hemorrhage and obstruction 2011   Dr Fidela [facility]   GERD (gastroesophageal reflux disease)    hx   Hyperlipemia    Hypertension    Personal history of colonic polyps 01/30/2010   hyperplastic rectal   Sleep apnea    on CPAP;  Sleep Medicine   Stroke [facility])    mini strokes-bilateral carotid endarterectomy   Substance abuse (Martin Archer)    History of alcohol abuse    Past Surgical History:  Procedure Laterality Date   AORTIC ARCH ANGIOGRAPHY N/A 04/26/2021   Procedure: AORTIC ARCH ANGIOGRAPHY;  Surgeon: Martin Hampshire, MD;  Location: [place] [facility];  Service: Cardiovascular;  Laterality: N/A;   CARDIAC CATHETERIZATION  01/25/2021   CAROTID ENDARTERECTOMY  11/2006   left Dr Martin Archer  [place]   COLONOSCOPY  2011   DP-positive FOBT/IDA   ENDARTERECTOMY Right 01/19/2015   Procedure: RIGHT CAROTID ENDARTERECTOMY WITH PATCH ANGIOPLASTY;  Surgeon: Mal Misty, MD;  Location: [place];  Service: Vascular;  Laterality: Right;   ENDARTERECTOMY Right 06/27/2021   Procedure: RIGHT REDO CAROTID ARTERY ENDARTERECTOMY & PLACEMENT OF 15Fr DRAIN;  Surgeon: Martin Mould, MD;   Location: [place];  Service: Vascular;  Laterality: Right;   ESOPHAGOGASTRODUODENOSCOPY Left 06/17/2013   Procedure: ESOPHAGOGASTRODUODENOSCOPY (EGD);  Surgeon: Martin Silence, MD;  Location[facility] ENDOSCOPY;  Service: Endoscopy;  Laterality: Left;   ESOPHAGOGASTRODUODENOSCOPY N/A 09/20/2015   Procedure: ESOPHAGOGASTRODUODENOSCOPY (EGD);  Surgeon: Martin Artist, MD;  Location: [facility] ENDOSCOPY;  Service: Endoscopy;  Laterality: N/A;   FOOT SURGERY Bilateral 2020   Martin Archer's foot   INGUINAL HERNIA REPAIR  1963   NASAL FRACTURE SURGERY  1974   PATCH ANGIOPLASTY Right 06/27/2021   Procedure: PATCH ANGIOPLASTY USING Rueben Bash BIOLOGIC PATCH;  Surgeon: Martin Mould, MD;  Location: [place];  Service: Vascular;  Laterality: Right;   PERIPHERAL VASCULAR INTERVENTION  04/26/2021   Procedure: PERIPHERAL VASCULAR INTERVENTION;  Surgeon: Martin Hampshire, MD;  Location: [place] [facility];  Service: Cardiovascular;;   RIGHT/LEFT HEART CATH AND CORONARY ANGIOGRAPHY N/A 01/25/2021   Procedure: RIGHT/LEFT HEART CATH AND CORONARY ANGIOGRAPHY;  Surgeon: Martin Booze, MD;  Location: [place] [facility];  Service: Cardiovascular;  Laterality: N/A;   TEE WITHOUT CARDIOVERSION N/A 01/17/2015   Procedure: TRANSESOPHAGEAL ECHOCARDIOGRAM (TEE);  Surgeon: Martin Klein, MD;  Location: [facility] ENDOSCOPY;  Service: Cardiovascular;  Laterality: N/A;   WISDOM TOOTH EXTRACTION       Current Outpatient Medications  Medication Sig Dispense Refill   acetaminophen (TYLENOL) 500 MG tablet Take 500 mg by mouth every 6 (six) hours as needed.     amLODipine (NORVASC) 10 MG tablet Take 10 mg by mouth daily.     aspirin EC 81 MG tablet Take 1 tablet (81 mg total) by mouth daily. Swallow whole. 30 tablet 11   atorvastatin (LIPITOR) 80 MG tablet TAKE 1 TABLET BY MOUTH DAILY 90 tablet 3   Carboxymethylcellul-Glycerin (LUBRICATING EYE DROPS OP) Place 1 drop into both eyes daily as needed (dry eyes).     carvedilol (COREG) 25 MG  tablet TAKE 1 TABLET(25 MG) BY MOUTH TWICE DAILY 180 tablet 3   clopidogrel (PLAVIX) 75 MG tablet TAKE 1 TABLET BY MOUTH DAILY WITH BREAKFAST 90 tablet 3   doxylamine, Sleep, (UNISOM) 25 MG tablet Take 25 mg by mouth at bedtime as needed for sleep.     empagliflozin (JARDIANCE) 10 MG TABS tablet Take 1 tablet (10 mg total) by mouth daily before breakfast. 90 tablet 3   ezetimibe (ZETIA) 10 MG tablet TAKE 1 TABLET(10 MG) BY MOUTH DAILY 90 tablet 3   furosemide (LASIX) 20 MG tablet TAKE 1 TABLET(20 MG) BY MOUTH DAILY AS NEEDED FOR LOWER EXTREMITY SWELLING 90 tablet 3   Multiple Vitamin (MULTIVITAMIN WITH MINERALS) TABS tablet Take 1 tablet by mouth daily.     sacubitril-valsartan (ENTRESTO) 97-103 MG TAKE 1 TABLET BY MOUTH TWICE DAILY 60 tablet 8   No current facility-administered medications for this visit.    Allergies:   Adderall [amphetamine-dextroamphet er], Prozac [fluoxetine hcl], Sertraline hcl, and Advil [ibuprofen]    Social History:  The patient  reports that he quit smoking about 7 years ago. His smoking use included cigarettes. He smoked an average of .5 packs per day. He has never used  smokeless tobacco. He reports that he does not currently use alcohol. He reports that he does not use drugs.   Family History:  The patient's family history includes Cancer in his maternal grandfather; Diabetes in his father and paternal grandfather; Heart attack in his mother; Heart attack (age of onset: 35) in his brother; Heart attack (age of onset: 59) in his maternal grandfather; Hypertension in his father; Stroke in his father and paternal uncle.    ROS:  Please see the history of present illness.   Otherwise, review of systems are positive for none.   All other systems are reviewed and negative.    PHYSICAL EXAM: VS:  BP 128/78   Pulse 82   Ht 5\' 8"  (1.727 m)   Wt 206 lb 6.4 oz (93.6 kg)   SpO2 99%   BMI 31.38 kg/m  , BMI Body mass index is 31.38 kg/m. GEN: Well nourished, well  developed, in no acute distress  HEENT: normal  Neck: no JVD or masses.  Right carotid bruit Cardiac: RRR; no murmurs, rubs, or gallops,no edema  Respiratory:  clear to auscultation bilaterally, normal work of breathing GI: soft, nontender, nondistended, + BS MS: no deformity or atrophy  Skin: warm and dry, no rash Neuro:  Strength and sensation are intact Psych: euthymic mood, full affect Vascular: Femoral pulses +1 on the right and +2 on the left.   EKG:  EKG is not ordered today.   Recent Labs: 03/28/2022: ALT 18; Hemoglobin 14.4; Platelets 206.0; TSH 1.48    Lipid Panel    Component Value Date/Time   CHOL 103 03/28/2022 1629   CHOL 124 04/13/2021 1018   CHOL 286 (H) 10/12/2013 0436   TRIG 212.0 (H) 03/28/2022 1629   TRIG 572 (H) 10/12/2013 0436   TRIG 284 (HH) 07/15/2006 1158   HDL 31.80 (L) 03/28/2022 1629   HDL 35 (L) 04/13/2021 1018   HDL 35 (L) 10/12/2013 0436   CHOLHDL 3 03/28/2022 1629   VLDL 42.4 (H) 03/28/2022 1629   LDLCALC 58 09/20/2021 1126   LDLCALC 59 04/13/2021 1018   LDLCALC  03/23/2020 1601     Comment:     . LDL cholesterol not calculated. Triglyceride levels greater than 400 mg/dL invalidate calculated LDL results. . Reference range: <100 . Desirable range <100 mg/dL for primary prevention;   <70 mg/dL for patients with CHD or diabetic patients  with > or = 2 CHD risk factors. Marland Kitchen LDL-C is now calculated using the Martin-Hopkins  calculation, which is a validated novel method providing  better accuracy than the Friedewald equation in the  estimation of LDL-C.  Cresenciano Genre et al. Annamaria Helling. 2841;324(40): 2061-2068  (http://education.[employer].com/faq/FAQ164)    LDLCALC NOT CALC 10/12/2013 0436   LDLDIRECT 48.0 03/28/2022 1629      Wt Readings from Last 3 Encounters:  09/25/22 206 lb 6.4 oz (93.6 kg)  08/23/22 206 lb (93.4 kg)  05/01/22 197 lb 12.8 oz (89.7 kg)           No data to display            ASSESSMENT AND  PLAN:  1.  Left subclavian artery stenosis: Status post successful covered stent placement .  No recurrent left arm claudication and left radial pulse is palpable.  Continue long-term dual antiplatelet therapy as tolerated given his extensive cardiovascular disease.    2.  Carotid artery stenosis: Occluded left carotid artery.  Status post recent redo right carotid endarterectomy.  Will plan on repeat carotid  Doppler in December of this year.  3.  Coronary artery disease involving native coronary arteries without angina: He has no anginal symptoms.  Continue medical therapy.    4.  Chronic systolic heart failure: He appears to be euvolemic and currently on carvedilol, Entresto and Jardiance.  5.  Peripheral arterial disease: The patient has evidence of bilateral leg claudication worse on the right side.  His right SFA is occluded.  He does have borderline right external iliac artery stenosis which might be treatable.  He has mild to moderate bilateral calf claudication and his symptoms are currently not lifestyle limiting.  He reports improvement overall since he started going to the gym every other day.  6.  Hyperlipidemia: Continue high-dose atorvastatin and Zetia.  I reviewed most recent lipid profile which showed an LDL of 58.  7.  Chronic kidney disease: Most recent GFR was 29.   Disposition:   FU with me in 12 months  Signed,  Kathlyn Sacramento, MD  09/26/2022 4:31 PM    [facility]

## 2022-09-27 ENCOUNTER — Telehealth: Payer: Self-pay | Admitting: Internal Medicine

## 2022-09-27 NOTE — Telephone Encounter (Signed)
Not quite yet, he is due in one more year from now   thanks

## 2022-09-27 NOTE — Telephone Encounter (Signed)
Is it time for another tetanus vaccine for this patient, please advise

## 2022-09-27 NOTE — Telephone Encounter (Signed)
Pt stated his daughter having a baby and he needs the TDAP shot. Pt also stated that he has medicare advised pt to go to his local pharmacist pt stated but he also has a supplement plan  Please call pt for further advise

## 2022-09-28 NOTE — Telephone Encounter (Signed)
Patient informed but would like to discuss at upcoming office visit

## 2022-10-01 ENCOUNTER — Ambulatory Visit (INDEPENDENT_AMBULATORY_CARE_PROVIDER_SITE_OTHER): Payer: 59 | Admitting: Internal Medicine

## 2022-10-01 VITALS — BP 126/74 | HR 65 | Temp 97.8°F | Ht 68.0 in | Wt 206.0 lb

## 2022-10-01 DIAGNOSIS — E538 Deficiency of other specified B group vitamins: Secondary | ICD-10-CM | POA: Diagnosis not present

## 2022-10-01 DIAGNOSIS — Z125 Encounter for screening for malignant neoplasm of prostate: Secondary | ICD-10-CM | POA: Diagnosis not present

## 2022-10-01 DIAGNOSIS — E559 Vitamin D deficiency, unspecified: Secondary | ICD-10-CM

## 2022-10-01 DIAGNOSIS — E1165 Type 2 diabetes mellitus with hyperglycemia: Secondary | ICD-10-CM | POA: Diagnosis not present

## 2022-10-01 DIAGNOSIS — N184 Chronic kidney disease, stage 4 (severe): Secondary | ICD-10-CM

## 2022-10-01 DIAGNOSIS — D500 Iron deficiency anemia secondary to blood loss (chronic): Secondary | ICD-10-CM

## 2022-10-01 DIAGNOSIS — I10 Essential (primary) hypertension: Secondary | ICD-10-CM

## 2022-10-01 DIAGNOSIS — Z0001 Encounter for general adult medical examination with abnormal findings: Secondary | ICD-10-CM | POA: Diagnosis not present

## 2022-10-01 DIAGNOSIS — N529 Male erectile dysfunction, unspecified: Secondary | ICD-10-CM

## 2022-10-01 DIAGNOSIS — E7800 Pure hypercholesterolemia, unspecified: Secondary | ICD-10-CM

## 2022-10-01 LAB — HEPATIC FUNCTION PANEL
ALT: 16 U/L (ref 0–53)
AST: 17 U/L (ref 0–37)
Albumin: 4.6 g/dL (ref 3.5–5.2)
Alkaline Phosphatase: 107 U/L (ref 39–117)
Bilirubin, Direct: 0.1 mg/dL (ref 0.0–0.3)
Total Bilirubin: 0.6 mg/dL (ref 0.2–1.2)
Total Protein: 7.1 g/dL (ref 6.0–8.3)

## 2022-10-01 LAB — VITAMIN B12: Vitamin B-12: 423 pg/mL (ref 211–911)

## 2022-10-01 LAB — URINALYSIS, ROUTINE W REFLEX MICROSCOPIC
Bilirubin Urine: NEGATIVE
Hgb urine dipstick: NEGATIVE
Ketones, ur: NEGATIVE
Leukocytes,Ua: NEGATIVE
Nitrite: NEGATIVE
RBC / HPF: NONE SEEN (ref 0–?)
Specific Gravity, Urine: 1.01 (ref 1.000–1.030)
Total Protein, Urine: NEGATIVE
Urine Glucose: >=1000 — AB
Urobilinogen, UA: 0.2 (ref 0.0–1.0)
pH: 5.5 (ref 5.0–8.0)

## 2022-10-01 LAB — BASIC METABOLIC PANEL
BUN: 34 mg/dL — ABNORMAL HIGH (ref 6–23)
CO2: 24 mEq/L (ref 19–32)
Calcium: 9.3 mg/dL (ref 8.4–10.5)
Chloride: 104 mEq/L (ref 96–112)
Creatinine, Ser: 2.13 mg/dL — ABNORMAL HIGH (ref 0.40–1.50)
GFR: 32.28 mL/min — ABNORMAL LOW (ref >60.00)
Glucose, Bld: 89 mg/dL (ref 70–99)
Potassium: 5.7 mEq/L — ABNORMAL HIGH (ref 3.5–5.1)
Sodium: 136 mEq/L (ref 135–145)

## 2022-10-01 LAB — CBC WITH DIFFERENTIAL/PLATELET
Basophils Absolute: 0.1 10*3/uL (ref 0.0–0.1)
Basophils Relative: 0.6 % (ref 0.0–3.0)
Eosinophils Absolute: 0.1 10*3/uL (ref 0.0–0.7)
Eosinophils Relative: 1.5 % (ref 0.0–5.0)
HCT: 42.7 % (ref 39.0–52.0)
Hemoglobin: 14.7 g/dL (ref 13.0–17.0)
Lymphocytes Relative: 20 % (ref 12.0–46.0)
Lymphs Abs: 1.9 10*3/uL (ref 0.7–4.0)
MCHC: 34.4 g/dL (ref 30.0–36.0)
MCV: 93.6 fl (ref 78.0–100.0)
Monocytes Absolute: 1.2 10*3/uL — ABNORMAL HIGH (ref 0.1–1.0)
Monocytes Relative: 12.1 % — ABNORMAL HIGH (ref 3.0–12.0)
Neutro Abs: 6.3 10*3/uL (ref 1.4–7.7)
Neutrophils Relative %: 65.8 % (ref 43.0–77.0)
Platelets: 224 10*3/uL (ref 150.0–400.0)
RBC: 4.57 Mil/uL (ref 4.22–5.81)
RDW: 13.6 % (ref 11.5–15.5)
WBC: 9.6 10*3/uL (ref 4.0–10.5)

## 2022-10-01 LAB — LIPID PANEL
Cholesterol: 119 mg/dL (ref 0–200)
HDL: 34.9 mg/dL — ABNORMAL LOW (ref >39.00)
LDL Cholesterol: 48 mg/dL (ref 0–99)
NonHDL: 84
Total CHOL/HDL Ratio: 3
Triglycerides: 178 mg/dL — ABNORMAL HIGH (ref 0.0–149.0)
VLDL: 35.6 mg/dL (ref 0.0–40.0)

## 2022-10-01 LAB — TSH: TSH: 2 u[IU]/mL (ref 0.35–5.50)

## 2022-10-01 LAB — MICROALBUMIN / CREATININE URINE RATIO
Creatinine,U: 48.4 mg/dL
Microalb Creat Ratio: 7.6 mg/g (ref 0.0–30.0)
Microalb, Ur: 3.7 mg/dL — ABNORMAL HIGH (ref 0.0–1.9)

## 2022-10-01 LAB — VITAMIN D 25 HYDROXY (VIT D DEFICIENCY, FRACTURES): VITD: 39.14 ng/mL (ref 30.00–100.00)

## 2022-10-01 LAB — HEMOGLOBIN A1C: Hgb A1c MFr Bld: 5.9 % (ref 4.6–6.5)

## 2022-10-01 LAB — PSA: PSA: 0.91 ng/mL (ref 0.10–4.00)

## 2022-10-01 MED ORDER — TIRZEPATIDE 2.5 MG/0.5ML ~~LOC~~ SOAJ: 2.5000 mg | SUBCUTANEOUS | 3 refills | Status: DC

## 2022-10-01 MED ORDER — SILDENAFIL CITRATE 100 MG PO TABS: 50.0000 mg | ORAL_TABLET | Freq: Every day | ORAL | 11 refills | Status: AC | PRN

## 2022-10-01 NOTE — Patient Instructions (Addendum)
Please have your Shingrix (shingles) shots done at your local pharmacy.  Please take all new medication as prescribed  - the mounjaro 2.5 mg weekly (and call for higher dose in 1 month if you are taking this ok)  Please take all new medication as prescribed - the viagra as needed  Please continue all other medications as before, and refills have been done if requested.  Please have the pharmacy call with any other refills you may need.  Please continue your efforts at being more active, low cholesterol diet, and weight control.  You are otherwise up to date with prevention measures today.  Please keep your appointments with your specialists as you may have planned - Dr Fuller Plan for the colonoscopy  Please go to the LAB at the blood drawing area for the tests to be done  You will be contacted by phone if any changes need to be made immediately.  Otherwise, you will receive a letter about your results with an explanation, but please check with MyChart first.  Please remember to sign up for MyChart if you have not done so, as this will be important to you in the future with finding out test results, communicating by private email, and scheduling acute appointments online when needed.  Please make an Appointment to return in 6 months, or sooner if needed, also with Lab Appointment for testing done 3-5 days before at the [facility] (so this is for TWO appointments - please see the scheduling desk as you leave)

## 2022-10-01 NOTE — Progress Notes (Unsigned)
Patient ID: Martin Archer, male   DOB: 08-24-1961, 62 y.o.   MRN: [id number]         Chief Complaint:: wellness exam and low vit d, hld, htn, dm, ckd3a,,anemia       HPI:  Martin Archer is a 62 y.o. male here for wellness exam; plans to call for colonoscopy soon himself, for shingrix at pharmacy, declines covid booster o/w up to date                        Also working part time at [employer].  Has new girlfriend.  Had negative carotid eval per pt, and stable PVD to LE's.  Pt denies polydipsia, polyuria, or new focal neuro s/s.    Pt denies fever, wt loss, night sweats, loss of appetite, or other constitutional symptoms  Hard to lose wt in the setting of DM  Has had worsening ED symptoms in the past yr, asking for viagra.     Wt Readings from Last 3 Encounters:  10/01/22 206 lb (93.4 kg)  09/25/22 206 lb 6.4 oz (93.6 kg)  08/23/22 206 lb (93.4 kg)   BP Readings from Last 3 Encounters:  10/01/22 126/74  09/25/22 128/78  08/23/22 119/81   Immunization History  Administered Date(s) Administered   Influenza Whole 06/28/2010   Influenza,inj,Quad PF,6+ Mos 07/25/2017, 05/20/2019, 05/29/2021   Influenza-Unspecified 08/06/2022   Moderna Sars-Covid-2 Vaccination 12/25/2019, 01/22/2020, 06/20/2020   Pneumococcal Polysaccharide-23 08/01/2017   Td 08/27/2001, 10/02/2013   There are no preventive care reminders to display for this patient.     Past Medical History:  Diagnosis Date   Anemia, unspecified    Arthritis    Diverticulosis of colon (without mention of hemorrhage)    Gastric ulcer with hemorrhage and obstruction 2011   Dr [facility]   GERD (gastroesophageal reflux disease)    hx   Hyperlipemia    Hypertension    Personal history of colonic polyps 01/30/2010   hyperplastic rectal   Sleep apnea    on CPAP; [facility]   Stroke [facility])    mini strokes-bilateral carotid endarterectomy   Substance abuse ([place])    History of alcohol abuse    Past Surgical History:  Procedure Laterality Date   AORTIC ARCH ANGIOGRAPHY N/A 04/26/2021   Procedure: AORTIC ARCH ANGIOGRAPHY;  Surgeon: Wellington Hampshire, MD;  Location: [place];  Service: Cardiovascular;  Laterality: N/A;   CARDIAC CATHETERIZATION  01/25/2021   CAROTID ENDARTERECTOMY  11/2006   left Dr Mamie Nick   COLONOSCOPY  2011   DP-positive FOBT/IDA   ENDARTERECTOMY Right 01/19/2015   Procedure: RIGHT CAROTID ENDARTERECTOMY WITH PATCH ANGIOPLASTY;  Surgeon: Mal Misty, MD;  Location: [place];  Service: Vascular;  Laterality: Right;   ENDARTERECTOMY Right 06/27/2021   Procedure: RIGHT REDO CAROTID ARTERY ENDARTERECTOMY & PLACEMENT OF 15Fr DRAIN;  Surgeon: Angelia Mould, MD;  Location: [place];  Service: Vascular;  Laterality: Right;   ESOPHAGOGASTRODUODENOSCOPY Left 06/17/2013   Procedure: ESOPHAGOGASTRODUODENOSCOPY (EGD);  Surgeon: Arta Silence, MD;  Location: [facility] END[facility];  Service: Endoscopy;  Laterality: Left;   ESOPHAGOGASTRODUODENOSCOPY N/A 09/20/2015   Procedure: ESOPHAGOGASTRODUODENOSCOPY (EGD);  Surgeon: Ladene Artist, MD;  Location[facility] ENDOSCOPY;  Service: Endoscopy;  Laterality: N/A;   FOOT SURGERY Bilateral 2020   Taylor's foot   INGUINAL HERNIA REPAIR  1963   NASAL FRACTURE SURGERY  1974   PATCH ANGIOPLASTY Right 06/27/2021   Procedure: [facility]  ANGIOPLASTY USING Rueben Bash BIOLOGIC PATCH;  Surgeon: Angelia Mould, MD;  Location: [facility] OR;  Service: Vascular;  Laterality: Right;   PERIPHERAL VASCULAR INTERVENTION  04/26/2021   Procedure: PERIPHERAL VASCULAR INTERVENTION;  Surgeon: Wellington Hampshire, MD;  Location: [place] [facility];  Service: Cardiovascular;;   RIGHT/LEFT HEART CATH AND CORONARY ANGIOGRAPHY N/A 01/25/2021   Procedure: RIGHT/LEFT HEART CATH AND CORONARY ANGIOGRAPHY;  Surgeon: Jettie Booze, MD;  Location: [place] [facility];  Service: Cardiovascular;  Laterality: N/A;   TEE WITHOUT CARDIOVERSION N/A 01/17/2015    Procedure: TRANSESOPHAGEAL ECHOCARDIOGRAM (TEE);  Surgeon: Sanda Klein, MD;  Location: [facility] ENDOSCOPY;  Service: Cardiovascular;  Laterality: N/A;   WISDOM TOOTH EXTRACTION      reports that he quit smoking about 7 years ago. His smoking use included cigarettes. He smoked an average of .5 packs per day. He has never used smokeless tobacco. He reports that he does not currently use alcohol. He reports that he does not use drugs. family history includes Cancer in his maternal grandfather; Diabetes in his father and paternal grandfather; Heart attack in his mother; Heart attack (age of onset: 58) in his brother; Heart attack (age of onset: 55) in his maternal grandfather; Hypertension in his father; Stroke in his father and paternal uncle. Allergies  Allergen Reactions   Adderall [Amphetamine-Dextroamphet Er] Diarrhea   Prozac [Fluoxetine Hcl] Diarrhea   Sertraline Hcl Diarrhea   Advil [Ibuprofen] Diarrhea   Current Outpatient Medications on File Prior to Visit  Medication Sig Dispense Refill   acetaminophen (TYLENOL) 500 MG tablet Take 500 mg by mouth every 6 (six) hours as needed.     amLODipine (NORVASC) 10 MG tablet Take 10 mg by mouth daily.     aspirin EC 81 MG tablet Take 1 tablet (81 mg total) by mouth daily. Swallow whole. 30 tablet 11   atorvastatin (LIPITOR) 80 MG tablet TAKE 1 TABLET BY MOUTH DAILY 90 tablet 3   Carboxymethylcellul-Glycerin (LUBRICATING EYE DROPS OP) Place 1 drop into both eyes daily as needed (dry eyes).     carvedilol (COREG) 25 MG tablet TAKE 1 TABLET(25 MG) BY MOUTH TWICE DAILY 180 tablet 3   clopidogrel (PLAVIX) 75 MG tablet TAKE 1 TABLET BY MOUTH DAILY WITH BREAKFAST 90 tablet 3   doxylamine, Sleep, (UNISOM) 25 MG tablet Take 25 mg by mouth at bedtime as needed for sleep.     empagliflozin (JARDIANCE) 10 MG TABS tablet Take 1 tablet (10 mg total) by mouth daily before breakfast. 90 tablet 3   ezetimibe (ZETIA) 10 MG tablet TAKE 1 TABLET(10 MG) BY MOUTH DAILY 90  tablet 3   fluorouracil (EFUDEX) 5 % cream Apply topically 2 (two) times daily.     furosemide (LASIX) 20 MG tablet TAKE 1 TABLET(20 MG) BY MOUTH DAILY AS NEEDED FOR LOWER EXTREMITY SWELLING 90 tablet 3   Multiple Vitamin (MULTIVITAMIN WITH MINERALS) TABS tablet Take 1 tablet by mouth daily.     sacubitril-valsartan (ENTRESTO) 97-103 MG TAKE 1 TABLET BY MOUTH TWICE DAILY 60 tablet 8   No current facility-administered medications on file prior to visit.        ROS:  All others reviewed and negative.  Objective        PE:  BP 126/74 (BP Location: Right Arm, Patient Position: Sitting, Cuff Size: Large)   Pulse 65   Temp 97.8 F (36.6 C) (Oral)   Ht 5\' 8"  (1.727 m)   Wt 206 lb (93.4 kg)   SpO2 97%  BMI 31.32 kg/m                 Constitutional: Pt appears in NAD               HENT: Head: NCAT.                Right Ear: External ear normal.                 Left Ear: External ear normal.                Eyes: . Pupils are equal, round, and reactive to light. Conjunctivae and EOM are normal               Nose: without d/c or deformity               Neck: Neck supple. Gross normal ROM               Cardiovascular: Normal rate and regular rhythm.                 Pulmonary/Chest: Effort normal and breath sounds without rales or wheezing.                Abd:  Soft, NT, ND, + BS, no organomegaly               Neurological: Pt is alert. At baseline orientation, motor grossly intact               Skin: Skin is warm. No rashes, no other new lesions, LE edema - trace bilateral                Psychiatric: Pt behavior is normal without agitation   Micro: none  Cardiac tracings I have personally interpreted today:  none  Pertinent Radiological findings (summarize): none   Lab Results  Component Value Date   WBC 9.6 10/01/2022   HGB 14.7 10/01/2022   HCT 42.7 10/01/2022   PLT 224.0 10/01/2022   GLUCOSE 89 10/01/2022   CHOL 119 10/01/2022   TRIG 178.0 (H) 10/01/2022   HDL 34.90 (L)  10/01/2022   LDLDIRECT 48.0 03/28/2022   LDLCALC 48 10/01/2022   ALT 16 10/01/2022   AST 17 10/01/2022   NA 136 10/01/2022   K 5.7 No hemolysis seen (H) 10/01/2022   CL 104 10/01/2022   CREATININE 2.13 (H) 10/01/2022   BUN 34 (H) 10/01/2022   CO2 24 10/01/2022   TSH 2.00 10/01/2022   PSA 0.91 10/01/2022   INR 1.0 06/23/2021   HGBA1C 5.9 10/01/2022   MICROALBUR 3.7 (H) 10/01/2022   Assessment/Plan:  Martin Archer is a 62 y.o. White or Caucasian [1] male with  has a past medical history of Anemia, unspecified, Arthritis, Diverticulosis of colon (without mention of hemorrhage), Gastric ulcer with hemorrhage and obstruction (2011), GERD (gastroesophageal reflux disease), Hyperlipemia, Hypertension, Personal history of colonic polyps (01/30/2010), Sleep apnea, Stroke ([place]), and Substance abuse ([place]).  Encounter for well adult exam with abnormal findings Age and sex appropriate education and counseling updated with regular exercise and diet Referrals for preventative services - pt to call for f/u colonoscopy with GI Dr Fuller Plan Immunizations addressed - for shingrix at pharmacy, declines covid booster Smoking counseling  - none needed Evidence for depression or other mood disorder - none significant Most recent labs reviewed. I have personally reviewed and have noted: 1) the patient's medical and social history 2) The patient's current medications and supplements 3) The  patient's height, weight, and BMI have been recorded in the chart   Vitamin D deficiency Last vitamin D Lab Results  Component Value Date   VD25OH 39.14 10/01/2022   Low to start oral replacement   HLD (hyperlipidemia) Lab Results  Component Value Date   LDLCALC 48 10/01/2022   Stable, pt to continue current statin lipitor 80 mg qd, zetia 10 mg qd   Essential hypertension BP Readings from Last 3 Encounters:  10/01/22 126/74  09/25/22 128/78  08/23/22 119/81   Stable, pt to continue medical  treatment norvasc 10 qd, entresto   Diabetes ([place]) Lab Results  Component Value Date   HGBA1C 5.9 10/01/2022    A1c ok, but unable to lose wt - , pt to continue current medical treatment jardiance 10 qd, and add mounjaro 2.5 mg weekyl   CKD (chronic kidney disease) stage 4, GFR 15-29 ml/min (HCC) Lab Results  Component Value Date   CREATININE 2.13 (H) 10/01/2022   Stable overall, cont to avoid nephrotoxins  Chronic blood loss anemia Lab Results  Component Value Date   WBC 9.6 10/01/2022   HGB 14.7 10/01/2022   HCT 42.7 10/01/2022   MCV 93.6 10/01/2022   PLT 224.0 10/01/2022  Stablle, cont to monitor on plavix  Erectile dysfunction With recent worsening, to add viagra prn  Followup: Return in about 6 months (around 04/01/2023).  [name], MD 10/03/2022 4:51 AM [facility]

## 2022-10-02 ENCOUNTER — Telehealth: Payer: Self-pay | Admitting: Internal Medicine

## 2022-10-02 NOTE — Telephone Encounter (Signed)
Called pt MD sent rx to [facility] on yesterday./lmb

## 2022-10-02 NOTE — Telephone Encounter (Signed)
Patient called. He said his medication was supposed to be sent to his pharmacy after his visit on 10/01/22. He would like sildenafil (VIAGRA) 100 MG tablet to be filled at [place], [place]

## 2022-10-03 ENCOUNTER — Encounter: Payer: Self-pay | Admitting: Internal Medicine

## 2022-10-03 DIAGNOSIS — N529 Male erectile dysfunction, unspecified: Secondary | ICD-10-CM | POA: Insufficient documentation

## 2022-10-03 NOTE — Assessment & Plan Note (Signed)
BP Readings from Last 3 Encounters:  10/01/22 126/74  09/25/22 128/78  08/23/22 119/81   Stable, pt to continue medical treatment norvasc 10 qd, entresto

## 2022-10-03 NOTE — Assessment & Plan Note (Signed)
Lab Results  Component Value Date   CREATININE 2.13 (H) 10/01/2022   Stable overall, cont to avoid nephrotoxins

## 2022-10-03 NOTE — Assessment & Plan Note (Signed)
Last vitamin D Lab Results  Component Value Date   VD25OH 39.14 10/01/2022   Low to start oral replacement

## 2022-10-03 NOTE — Assessment & Plan Note (Signed)
With recent worsening, to add viagra prn

## 2022-10-03 NOTE — Assessment & Plan Note (Signed)
Lab Results  Component Value Date   HGBA1C 5.9 10/01/2022    A1c ok, but unable to lose wt - , pt to continue current medical treatment jardiance 10 qd, and add mounjaro 2.5 mg weekyl

## 2022-10-03 NOTE — Assessment & Plan Note (Signed)
Age and sex appropriate education and counseling updated with regular exercise and diet Referrals for preventative services - pt to call for f/u colonoscopy with GI Dr Fuller Plan Immunizations addressed - for shingrix at pharmacy, declines covid booster Smoking counseling  - none needed Evidence for depression or other mood disorder - none significant Most recent labs reviewed. I have personally reviewed and have noted: 1) the patient's medical and social history 2) The patient's current medications and supplements 3) The patient's height, weight, and BMI have been recorded in the chart

## 2022-10-03 NOTE — Assessment & Plan Note (Signed)
Lab Results  Component Value Date   WBC 9.6 10/01/2022   HGB 14.7 10/01/2022   HCT 42.7 10/01/2022   MCV 93.6 10/01/2022   PLT 224.0 10/01/2022  Stablle, cont to monitor on plavix

## 2022-10-03 NOTE — Assessment & Plan Note (Signed)
Lab Results  Component Value Date   LDLCALC 48 10/01/2022   Stable, pt to continue current statin lipitor 80 mg qd, zetia 10 mg qd

## 2022-10-25 ENCOUNTER — Telehealth: Payer: Self-pay | Admitting: Internal Medicine

## 2022-10-25 MED ORDER — TIRZEPATIDE 5 MG/0.5ML ~~LOC~~ SOAJ: 5.0000 mg | SUBCUTANEOUS | 11 refills | Status: DC

## 2022-10-25 NOTE — Telephone Encounter (Signed)
Caller & Relationship to patient: Self  Call back number: [phone]    Date of last office visit: 2.5.24  Date of next office visit: N/A  Medication(s) to be refilled:  tirzepatide Darcel Bayley) 2.5 MG/0.5ML Pen   Pt states that his dosage is supposed to go up  Preferred Pharmacy:   [id number]   Phone: [phone]  Fax: [phone]

## 2022-10-25 NOTE — Telephone Encounter (Signed)
Champion for increase to 5 mg weekly  - done erx

## 2022-12-06 ENCOUNTER — Telehealth: Payer: Self-pay | Admitting: Internal Medicine

## 2022-12-06 MED ORDER — TRULICITY 0.75 MG/0.5ML ~~LOC~~ SOAJ: 0.7500 mg | SUBCUTANEOUS | 3 refills | Status: AC

## 2022-12-06 NOTE — Telephone Encounter (Signed)
Unfortuantely I hear that ozempic is largely on backorder as well  Please ask pt if he would want to change to trulicity maybe even temporarily, but also should check his insurance to see if covered

## 2022-12-06 NOTE — Telephone Encounter (Signed)
Called pt he states he will try the trulicity. [facility].Marland KitchenRaechel Chute

## 2022-12-06 NOTE — Telephone Encounter (Signed)
Patient called and said tirzepatide [facility]) 5 MG/0.5ML Pen  is on backorder at multiple pharmacies. He would like to know if Dr. Jonny Ruiz can send anything else in. Best callback is [phone].

## 2022-12-06 NOTE — Telephone Encounter (Signed)
Called pt inform that MD is out of the office until Monday. He states that is fine he haven't had meds in weeks now. Pharmacy states they do not have a release date. Does md want to rx something else.Marland KitchenRaechel Chute

## 2022-12-06 NOTE — Telephone Encounter (Signed)
Ok this is done 

## 2023-01-24 ENCOUNTER — Ambulatory Visit: Payer: 59 | Admitting: Gastroenterology

## 2023-01-31 LAB — LAB REPORT - SCANNED
Albumin, Urine POC: 26.3
Creatinine, POC: 42.8 mg/dL
EGFR: 36
Microalb Creat Ratio: 61

## 2023-02-05 ENCOUNTER — Encounter: Payer: Self-pay | Admitting: Nephrology

## 2023-02-11 ENCOUNTER — Telehealth: Payer: Self-pay

## 2023-02-11 ENCOUNTER — Ambulatory Visit (INDEPENDENT_AMBULATORY_CARE_PROVIDER_SITE_OTHER): Payer: 59 | Admitting: Gastroenterology

## 2023-02-11 ENCOUNTER — Encounter: Payer: Self-pay | Admitting: Gastroenterology

## 2023-02-11 VITALS — BP 110/80 | HR 87 | Ht 68.0 in | Wt 196.0 lb

## 2023-02-11 DIAGNOSIS — Z8711 Personal history of peptic ulcer disease: Secondary | ICD-10-CM | POA: Diagnosis not present

## 2023-02-11 DIAGNOSIS — Z1212 Encounter for screening for malignant neoplasm of rectum: Secondary | ICD-10-CM | POA: Diagnosis not present

## 2023-02-11 DIAGNOSIS — Z7902 Long term (current) use of antithrombotics/antiplatelets: Secondary | ICD-10-CM | POA: Diagnosis not present

## 2023-02-11 DIAGNOSIS — Z1211 Encounter for screening for malignant neoplasm of colon: Secondary | ICD-10-CM | POA: Diagnosis not present

## 2023-02-11 MED ORDER — NA SULFATE-K SULFATE-MG SULF 17.5-3.13-1.6 GM/177ML PO SOLN: 1.0000 | Freq: Once | ORAL | 0 refills | Status: AC

## 2023-02-11 NOTE — Telephone Encounter (Signed)
   Name: Martin Archer  DOB: 1961-01-18  MRN: [id number]  Primary Cardiologist: Lance Muss, MD  Chart reviewed as part of pre-operative protocol coverage. The patient has an upcoming visit scheduled with Robin Searing, NP on 02/12/2023 at which time clearance can be addressed in case there are any issues that would impact surgical recommendations.  Colonoscopy is not scheduled until 03/12/2023 as below. I added preop FYI to appointment note so that provider is aware to address at time of outpatient visit.  Per office protocol the cardiology provider should forward their finalized clearance decision and recommendations regarding antiplatelet therapy to the requesting party below.    This message will also be routed to Dr Kirke Corin and Dr. Eldridge Dace for input on holding Plavix as requested below so that this information is available to the clearing provider at time of patient's appointment.   I will route this message as FYI to requesting party and remove this message from the preop box as separate preop APP input not needed at this time.   Please call with any questions.  Joylene Grapes, NP  02/11/2023, 12:23 PM

## 2023-02-11 NOTE — Telephone Encounter (Signed)
Will defer to Dr. Kirke Corin since it is a peripheral stent that the patient has.

## 2023-02-11 NOTE — Telephone Encounter (Signed)
[facility] Pre-operative Risk Assessment     Request for surgical clearance:     Endoscopy Procedure  What type of surgery is being performed?     colonoscopy  When is this surgery scheduled?     03/12/23  What type of clearance is required ?   Pharmacy  Are there any medications that need to be held prior to surgery and how long? Plavix x 5 days  Practice name and name of physician performing surgery?      [facility]  What is your office phone and fax number?      Phone- [phone]  Fax- [phone]  Anesthesia type (None, local, MAC, general) ?       MAC

## 2023-02-11 NOTE — Progress Notes (Addendum)
Assessment     CRC screening, average risk History of GU with bleed in 2017, h/o GU in 2011. H pylori negative DM on Mounjaro or Trulicity q week depending on availability. CKD4 Left subclavian artery stenosis - S/P left subclavian artery stent on Plavix Carotid artery stenosis - S/P right carotid endarterectomy Peripheral artery disease  CAD Chronic systolic heart failure, EF 40-45%   Recommendations    Schedule colonoscopy. The risks (including bleeding, perforation, infection, missed lesions, medication reactions and possible hospitalization or surgery if complications occur), benefits, and alternatives to colonoscopy with possible biopsy and possible polypectomy were discussed with the patient and they consent to proceed.   Hold Mounjaro or Trulicity for at least 7 days prior to colonoscopy.  Hold Plavix 5 days before procedure - will instruct when and how to resume after procedure. Low but real risk of cardiovascular event such as heart attack, stroke, embolism, thrombosis or ischemia/infarct of other organs off Plavix explained and need to seek urgent help if this occurs. The patient consents to proceed. Will communicate by phone or EMR with patient's prescribing provider to confirm that holding Plavix is reasonable in this case.     HPI    This is a 62 year old male presenting for CRC screening, average risk.  He underwent colonoscopy in June 2011 by Dr. Jarold Motto that showed 1 small hyperplastic polyp and left colon diverticulosis.  He was evaluated in the office in September 2021. We discussed colonoscopy versus Cologuard however he did not schedule at that time.  He has no ongoing gastrointestinal complaints.  His PCP has encouraged him to pursue colonoscopy and Martin Archer is agreeable to proceed. Denies weight loss, abdominal pain, constipation, diarrhea, change in stool caliber, melena, hematochezia, nausea, vomiting, dysphagia, reflux symptoms, chest pain.   Labs / Imaging        Latest Ref Rng & Units 10/01/2022   11:05 AM 03/28/2022    4:29 PM 09/20/2021   11:26 AM  Hepatic Function  Total Protein 6.0 - 8.3 g/dL 7.1  6.6  6.6   Albumin 3.5 - 5.2 g/dL 4.6  4.0  4.1   AST 0 - 37 U/L 17  21  20    ALT 0 - 53 U/L 16  18  21    Alk Phosphatase 39 - 117 U/L 107  97  115   Total Bilirubin 0.2 - 1.2 mg/dL 0.6  0.5  0.4   Bilirubin, Direct 0.0 - 0.3 mg/dL 0.1  0.1  0.1        Latest Ref Rng & Units 10/01/2022   11:05 AM 03/28/2022    4:29 PM 09/20/2021   11:26 AM  CBC  WBC 4.0 - 10.5 K/uL 9.6  9.7  9.3   Hemoglobin 13.0 - 17.0 g/dL 09.8  11.9  14.7   Hematocrit 39.0 - 52.0 % 42.7  43.2  43.1   Platelets 150.0 - 400.0 K/uL 224.0  206.0  283.0    Current Medications, Allergies, Past Medical History, Past Surgical History, Family History and Social History were reviewed in [employer] record.   Physical Exam: General: Well developed, well nourished, no acute distress Head: Normocephalic and atraumatic Eyes: Sclerae anicteric, EOMI Ears: Normal auditory acuity Mouth: No deformities or lesions noted Lungs: Clear throughout to auscultation Heart: Regular rate and rhythm; No murmurs, rubs or bruits Abdomen: Soft, non tender and non distended. No masses, hepatosplenomegaly or hernias noted. Normal Bowel sounds Rectal: Deferred to colonoscopy Musculoskeletal: Symmetrical with  no gross deformities  Pulses:  Normal pulses noted Extremities: No edema or deformities noted Neurological: Alert oriented x 4, grossly nonfocal Psychological:  Alert and cooperative. Normal mood and affect   Martin Sowder T. Russella Dar, MD 02/11/2023, 10:51 AM

## 2023-02-11 NOTE — Progress Notes (Addendum)
Office Visit    Patient Name: Martin Archer Date of Encounter: 02/11/2023  Primary Care Provider:  Corwin Levins, MD Primary Cardiologist:  Lance Muss, MD Primary Electrophysiologist: None   Past Medical History    Past Medical History:  Diagnosis Date   Anemia, unspecified    Arthritis    Diverticulosis of colon (without mention of hemorrhage)    Gastric ulcer with hemorrhage and obstruction 2011   Dr Julien Girt [facility]   GERD (gastroesophageal reflux disease)    hx   Hyperlipemia    Hypertension    Personal history of colonic polyps 01/30/2010   hyperplastic rectal   Sleep apnea    on CPAP; [facility]   Stroke [facility])    mini strokes-bilateral carotid endarterectomy   Substance abuse (HCC)    History of alcohol abuse   Past Surgical History:  Procedure Laterality Date   AORTIC ARCH ANGIOGRAPHY N/A 04/26/2021   Procedure: AORTIC ARCH ANGIOGRAPHY;  Surgeon: Iran Ouch, MD;  Location: [facility] INVASIVE CV LAB;  Service: Cardiovascular;  Laterality: N/A;   CARDIAC CATHETERIZATION  01/25/2021   CAROTID ENDARTERECTOMY  11/2006   left Dr Jerilee Field   COLONOSCOPY  2011   DP-positive FOBT/IDA   ENDARTERECTOMY Right 01/19/2015   Procedure: RIGHT CAROTID ENDARTERECTOMY WITH PATCH ANGIOPLASTY;  Surgeon: Pryor Ochoa, MD;  Location: [facility] OR;  Service: Vascular;  Laterality: Right;   ENDARTERECTOMY Right 06/27/2021   Procedure: RIGHT REDO CAROTID ARTERY ENDARTERECTOMY & PLACEMENT OF 15Fr DRAIN;  Surgeon: Chuck Hint, MD;  Location: [facility] OR;  Service: Vascular;  Laterality: Right;   ESOPHAGOGASTRODUODENOSCOPY Left 06/17/2013   Procedure: ESOPHAGOGASTRODUODENOSCOPY (EGD);  Surgeon: Willis Modena, MD;  Location: [facility] ENDOSCOPY;  Service: Endoscopy;  Laterality: Left;   ESOPHAGOGASTRODUODENOSCOPY N/A 09/20/2015   Procedure: ESOPHAGOGASTRODUODENOSCOPY (EGD);  Surgeon: Meryl Dare, MD;  Location: [facility] ENDOSCOPY;  Service: Endoscopy;  Laterality:  N/A;   FOOT SURGERY Bilateral 2020   Taylor's foot   INGUINAL HERNIA REPAIR  1963   NASAL FRACTURE SURGERY  1974   PATCH ANGIOPLASTY Right 06/27/2021   Procedure: PATCH ANGIOPLASTY USING Livia Snellen BIOLOGIC PATCH;  Surgeon: Chuck Hint, MD;  Location: [facility] OR;  Service: Vascular;  Laterality: Right;   PERIPHERAL VASCULAR INTERVENTION  04/26/2021   Procedure: PERIPHERAL VASCULAR INTERVENTION;  Surgeon: Iran Ouch, MD;  Location: [facility] INVASIVE CV LAB;  Service: Cardiovascular;;   RIGHT/LEFT HEART CATH AND CORONARY ANGIOGRAPHY N/A 01/25/2021   Procedure: RIGHT/LEFT HEART CATH AND CORONARY ANGIOGRAPHY;  Surgeon: Corky Crafts, MD;  Location: [facility] INVASIVE CV LAB;  Service: Cardiovascular;  Laterality: N/A;   TEE WITHOUT CARDIOVERSION N/A 01/17/2015   Procedure: TRANSESOPHAGEAL ECHOCARDIOGRAM (TEE);  Surgeon: Thurmon Fair, MD;  Location: [facility] ENDOSCOPY;  Service: Cardiovascular;  Laterality: N/A;   WISDOM TOOTH EXTRACTION      Allergies  Allergies  Allergen Reactions   Adderall [Amphetamine-Dextroamphet Er] Diarrhea   Prozac [Fluoxetine Hcl] Diarrhea   Sertraline Hcl Diarrhea   Advil [Ibuprofen] Diarrhea     History of Present Illness    Martin Archer  is a 62 year old male with a PMH of HTN, tobacco use, carotid artery disease s/p bilateral endarterectomy with redo 2022, chronic systolic HF, TIAs, anemia, CKD, GI bleed, tobacco abuse, PAD s/p VBX stent placed to the left subclavian artery, CAD s/p [facility] 2022 with CTO of RCA and mild nonobstructive disease in remaining arteries.  [name] has been followed by Dr. Eldridge Dace since 2022 for  evaluation of HTN and abnormal echo.  2D echo was completed 11/2020 with EF of 40-45% with global hypokinesis and grade 3 DD close severely dilated LA/RA with mild dilation of aortic root (38 mm).  He underwent bilateral carotid enterectomy initially in 2016 with redo to the right and 2022.  He was referred to Dr. Kirke Corin on 03/2021 for  management of left subclavian artery stenosis.  He presented on 01/17/2021 with atypical chest pain and decreased EF of 40-45%.  He underwent [facility]  01/25/2021 that showed CTO of the proximal RCA with left-to-right collaterals and nonobstructive disease in remaining arteries.  He underwent subclavian balloon angioplasty with stent placed in 03/2021.  He was last seen by Dr. Eldridge Dace on 03/2022 for follow-up.  During visit she was euvolemic and blood pressure was initially elevated but improved after relaxation.  He had no complaints of angina.  Attending presents today for preoperative clearance visit alone.  Since last being seen in the office patient reports that he has been doing well with no new cardiac complaints since previous visit.  He has been following a good diet and exercising.  He does report some episodes of low p.o. intake which may be associated with his increased dizziness.  His blood pressure today is well-controlled at 124/84 and heart rate was 98 bpm.  He is euvolemic on examination and reports occasional indiscretions with salt.  He was excited and recently went to [place] to see his new grandchild.  He is working currently part-time at [employer] and in [employer].  Patient denies chest pain, palpitations, dyspnea, PND, orthopnea, nausea, vomiting, dizziness, syncope, edema, weight gain, or early satiety.  Home Medications    Current Outpatient Medications  Medication Sig Dispense Refill   acetaminophen (TYLENOL) 500 MG tablet Take 500 mg by mouth every 6 (six) hours as needed.     amLODipine (NORVASC) 10 MG tablet Take 10 mg by mouth daily.     aspirin EC 81 MG tablet Take 1 tablet (81 mg total) by mouth daily. Swallow whole. 30 tablet 11   atorvastatin (LIPITOR) 80 MG tablet TAKE 1 TABLET BY MOUTH DAILY 90 tablet 3   Carboxymethylcellul-Glycerin (LUBRICATING EYE DROPS OP) Place 1 drop into both eyes daily as needed (dry eyes).     carvedilol (COREG) 25 MG  tablet TAKE 1 TABLET(25 MG) BY MOUTH TWICE DAILY 180 tablet 3   clopidogrel (PLAVIX) 75 MG tablet TAKE 1 TABLET BY MOUTH DAILY WITH BREAKFAST 90 tablet 3   doxylamine, Sleep, (UNISOM) 25 MG tablet Take 25 mg by mouth at bedtime as needed for sleep.     Dulaglutide (TRULICITY) 0.75 MG/0.5ML SOPN Inject 0.75 mg into the skin once a week. 6 mL 3   empagliflozin (JARDIANCE) 10 MG TABS tablet Take 1 tablet (10 mg total) by mouth daily before breakfast. 90 tablet 3   ezetimibe (ZETIA) 10 MG tablet TAKE 1 TABLET(10 MG) BY MOUTH DAILY 90 tablet 3   fluorouracil (EFUDEX) 5 % cream Apply topically 2 (two) times daily.     furosemide (LASIX) 20 MG tablet TAKE 1 TABLET(20 MG) BY MOUTH DAILY AS NEEDED FOR LOWER EXTREMITY SWELLING 90 tablet 3   Multiple Vitamin (MULTIVITAMIN WITH MINERALS) TABS tablet Take 1 tablet by mouth daily.     Na Sulfate-K Sulfate-Mg Sulf 17.5-3.13-1.6 GM/177ML SOLN Take 1 kit by mouth once for 1 dose. 354 mL 0   sacubitril-valsartan (ENTRESTO) 97-103 MG TAKE 1 TABLET BY MOUTH TWICE DAILY 60 tablet  8   sildenafil (VIAGRA) 100 MG tablet Take 0.5-1 tablets (50-100 mg total) by mouth daily as needed for erectile dysfunction. 5 tablet 11   tirzepatide (MOUNJARO) 5 MG/0.5ML Pen Inject 5 mg into the skin once a week. (Patient not taking: Reported on 02/11/2023) 6 mL 11   No current facility-administered medications for this visit.     Review of Systems  Please see the history of present illness.    (+) Dizziness (+) Palpitations  All other systems reviewed and are otherwise negative except as noted above.  Physical Exam    Wt Readings from Last 3 Encounters:  02/11/23 196 lb (88.9 kg)  10/01/22 206 lb (93.4 kg)  09/25/22 206 lb 6.4 oz (93.6 kg)   ZO:XWRUE were no vitals filed for this visit.,There is no height or weight on file to calculate BMI.  Constitutional:      Appearance: Healthy appearance. Not in distress.  Neck:     Vascular: JVD normal.  Pulmonary:     Effort:  Pulmonary effort is normal.     Breath sounds: No wheezing. No rales. Diminished in the bases Cardiovascular:     Normal rate. Regular rhythm. Normal S1. Normal S2.      Murmurs: There is no murmur.  Edema:    Peripheral edema absent.  Abdominal:     Palpations: Abdomen is soft non tender. There is no hepatomegaly.  Skin:    General: Skin is warm and dry.  Neurological:     General: No focal deficit present.     Mental Status: Alert and oriented to person, place and time.     Cranial Nerves: Cranial nerves are intact.  EKG/LABS/ Recent Cardiac Studies    ECG personally reviewed by me today -sinus rhythm with rate of 98 bpm and TWI in V6 with no acute changes consistent with previous EKG.  Cardiac Studies & Procedures   CARDIAC CATHETERIZATION  CARDIAC CATHETERIZATION 01/25/2021  Narrative  Prox RCA lesion is 100% stenosed. Left to right collaterals.  1st Diag lesion is 25% stenosed.  3rd Mrg lesion is 70% stenosed.  LV end diastolic pressure is normal.  There is no aortic valve stenosis.  Hemodynamic findings consistent with mild pulmonary hypertension.  Ao sat 94%, PA sat 67%, mean PA pressure 27 mm Hg; mean PCWP 13 mm Hg; CO 4.6 L/min; CI 2.26  LV dysfuction out of proportion to the degree of CAD.  Medical therapy for heart failure.  Findings Coronary Findings Diagnostic  Dominance: Right  Left Anterior Descending There is mild diffuse disease throughout the vessel.  First Diagonal Branch 1st Diag lesion is 25% stenosed.  Second Diagonal Branch Vessel is small in size. The vessel exhibits minimal luminal irregularities.  Left Circumflex There is mild diffuse disease throughout the vessel.  Third Obtuse Marginal Branch 3rd Mrg lesion is 70% stenosed.  Right Coronary Artery Prox RCA lesion is 100% stenosed. The lesion is chronically occluded.  Right Posterior Descending Artery Collaterals RPDA filled by collaterals from 2nd Sept.  Intervention  No  interventions have been documented.     ECHOCARDIOGRAM  ECHOCARDIOGRAM COMPLETE 12/20/2020  Narrative ECHOCARDIOGRAM REPORT    Patient Name:   KHAMERON CRAINE Date of Exam: 12/20/2020 Medical Rec #:  [id number]            Height:       67.0 in Accession #:    [id number]           Weight:  203.8 lb Date of Birth:  05-09-1961            BSA:          2.038 m Patient Age:    59 years             BP:           112/96 mmHg Patient Gender: M                    HR:           99 bpm. Exam Location:  [address]  Procedure: 2D Echo, Cardiac Doppler, Color Doppler and Intracardiac Opacification Agent  Indications:    R06.00 Dyspnea I31.3 Pericardial Effusion  History:        Patient has prior history of Echocardiogram examinations, most recent 09/19/2015. Stroke, Signs/Symptoms:Dyspnea; Risk Factors:Family History of Coronary Artery Disease, Hypertension, Dyslipidemia, Former Smoker and Sleep Apnea.  Sonographer:    Farrel Conners RDCS Referring Phys: [id number] Len Blalock [facility]  IMPRESSIONS   1. Left ventricular ejection fraction, by estimation, is 40 to 45%. The left ventricle has mildly decreased function. The left ventricle demonstrates global hypokinesis. The left ventricular internal cavity size was moderately dilated. Left ventricular diastolic parameters are consistent with Grade III diastolic dysfunction (restrictive). Elevated left ventricular end-diastolic pressure. 2. Right ventricular systolic function is normal. The right ventricular size is normal. There is mildly elevated pulmonary artery systolic pressure. The estimated right ventricular systolic pressure is 40.2 mmHg. 3. Left atrial size was severely dilated. 4. Right atrial size was severely dilated. 5. The mitral valve is normal in structure. Mild mitral valve regurgitation. No evidence of mitral stenosis. 6. The aortic valve has an indeterminant number of cusps. Aortic valve regurgitation is not visualized. Mild  to moderate aortic valve sclerosis/calcification is present, without any evidence of aortic stenosis. 7. Aortic dilatation noted. There is mild dilatation of the aortic root, measuring 38 mm. There is mild dilatation of the ascending aorta, measuring 43 mm. 8. The inferior vena cava is normal in size with greater than 50% respiratory variability, suggesting right atrial pressure of 3 mmHg. 9. Compared to prior echo, LVF has declined.  FINDINGS Left Ventricle: Left ventricular ejection fraction, by estimation, is 40 to 45%. The left ventricle has mildly decreased function. The left ventricle demonstrates global hypokinesis. Definity contrast agent was given IV to delineate the left ventricular endocardial borders. The left ventricular internal cavity size was moderately dilated. There is no left ventricular hypertrophy. Left ventricular diastolic parameters are consistent with Grade III diastolic dysfunction (restrictive). Elevated left ventricular end-diastolic pressure.  Right Ventricle: The right ventricular size is normal. No increase in right ventricular wall thickness. Right ventricular systolic function is normal. There is mildly elevated pulmonary artery systolic pressure. The tricuspid regurgitant velocity is 3.05 m/s, and with an assumed right atrial pressure of 3 mmHg, the estimated right ventricular systolic pressure is 40.2 mmHg.  Left Atrium: Left atrial size was severely dilated.  Right Atrium: Right atrial size was severely dilated.  Pericardium: There is no evidence of pericardial effusion.  Mitral Valve: The mitral valve is normal in structure. Mild to moderate mitral annular calcification. Mild mitral valve regurgitation. No evidence of mitral valve stenosis.  Tricuspid Valve: The tricuspid valve is normal in structure. Tricuspid valve regurgitation is mild . No evidence of tricuspid stenosis.  Aortic Valve: The aortic valve has an indeterminant number of cusps. Aortic valve  regurgitation is not visualized. Mild to moderate aortic  valve sclerosis/calcification is present, without any evidence of aortic stenosis.  Pulmonic Valve: The pulmonic valve was normal in structure. Pulmonic valve regurgitation is trivial. No evidence of pulmonic stenosis.  Aorta: Aortic dilatation noted. There is mild dilatation of the aortic root, measuring 38 mm. There is mild dilatation of the ascending aorta, measuring 43 mm.  Venous: The inferior vena cava is normal in size with greater than 50% respiratory variability, suggesting right atrial pressure of 3 mmHg.  IAS/Shunts: No atrial level shunt detected by color flow Doppler.   LEFT VENTRICLE PLAX 2D LVIDd:         6.40 cm  Diastology LVIDs:         5.10 cm  LV e' medial:    4.90 cm/s LV PW:         1.20 cm  LV E/e' medial:  29.5 LV IVS:        1.10 cm  LV e' lateral:   8.70 cm/s LVOT diam:     2.40 cm  LV E/e' lateral: 16.6 LV SV:         47 LV SV Index:   23 LVOT Area:     4.52 cm   RIGHT VENTRICLE RV S prime:     7.29 cm/s TAPSE (M-mode): 1.2 cm  LEFT ATRIUM              Index       RIGHT ATRIUM           Index LA diam:        4.90 cm  2.40 cm/m  RA Area:     25.50 cm LA Vol (A2C):   93.5 ml  45.87 ml/m RA Volume:   83.40 ml  40.91 ml/m LA Vol (A4C):   94.6 ml  46.41 ml/m LA Biplane Vol: 103.0 ml 50.53 ml/m AORTIC VALVE LVOT Vmax:   59.40 cm/s LVOT Vmean:  36.400 cm/s LVOT VTI:    0.104 m  AORTA Ao Root diam: 3.80 cm Ao Asc diam:  4.30 cm  MITRAL VALVE                TRICUSPID VALVE MV Area (PHT): cm          TR Peak grad:   37.2 mmHg MV Decel Time: 136 msec     TR Vmax:        305.00 cm/s MR Peak grad: 136.4 mmHg MR Mean grad: 90.0 mmHg     SHUNTS MR Vmax:      584.00 cm/s   Systemic VTI:  0.10 m MR Vmean:     445.5 cm/s    Systemic Diam: 2.40 cm MV E velocity: 144.50 cm/s MV A velocity: 66.70 cm/s MV E/A ratio:  2.17  Armanda Magic MD Electronically signed by Armanda Magic MD Signature  Date/Time: 12/20/2020/10:56:03 AM    Final   TEE  ECHO TEE 01/18/2015            Lab Results  Component Value Date   WBC 9.6 10/01/2022   HGB 14.7 10/01/2022   HCT 42.7 10/01/2022   MCV 93.6 10/01/2022   PLT 224.0 10/01/2022   Lab Results  Component Value Date   CREATININE 2.13 (H) 10/01/2022   BUN 34 (H) 10/01/2022   NA 136 10/01/2022   K 5.7 No hemolysis seen (H) 10/01/2022   CL 104 10/01/2022   CO2 24 10/01/2022   Lab Results  Component Value Date   ALT 16 10/01/2022   AST 17  10/01/2022   ALKPHOS 107 10/01/2022   BILITOT 0.6 10/01/2022   Lab Results  Component Value Date   CHOL 119 10/01/2022   HDL 34.90 (L) 10/01/2022   LDLCALC 48 10/01/2022   LDLDIRECT 48.0 03/28/2022   TRIG 178.0 (H) 10/01/2022   CHOLHDL 3 10/01/2022    Lab Results  Component Value Date   HGBA1C 5.9 10/01/2022     Assessment & Plan    1.  Preoperative clearance: -Patient is RCRI score is 11% -The patient affirms he has been doing well without any new cardiac symptoms. They are able to achieve 6 METS without cardiac limitations. Therefore, based on ACC/AHA guidelines, the patient would be at acceptable risk for the planned procedure without further cardiovascular testing. The patient was advised that if he develops new symptoms prior to surgery to contact our office to arrange for a follow-up visit, and he verbalized understanding.   -Guidance for holding Plavix is still being obtained from primary provider.  Patient is aware to await instructions before holding medication.  2.  Chronic systolic CHF: -Patient is euvolemic today on examination -Continue GDMT with Coreg 25 mg twice daily, Jardiance 10 mg daily, Entresto 97/103 mg twice daily  3.  Coronary artery disease: -s/p [facility] 2022 with CTO of RCA and mild nonobstructive disease in remaining arteries. -Continue Lipitor 80 mg daily, ASA 81 mg daily, carvedilol 25 mg twice daily, Plavix 75 mg daily, Zetia 10 mg daily  4.   Essential hypertension: -Patient's blood pressure today was 124/84 -Continue Norvasc 10 mg daily, carvedilol 25 mg twice daily, Entresto 97/23 mg twice daily  5.  Peripheral artery disease: -Patient has history of subclavian stenosis with stent placed and balloon angioplasty in 03/2021. -Continue GDMT with Plavix 75 mg and ASA 81 mg  6. Dizziness: -Patient reports increased episodes of dizziness -We will obtain 7-day ZIO monitor to rule out arrhythmia related to her current dizziness.   Disposition: Follow-up with Lance Muss, MD or APP in 6 months    Medication Adjustments/Labs and Tests Ordered: Current medicines are reviewed at length with the patient today.  Concerns regarding medicines are outlined above.   Signed, Napoleon Form, Leodis Rains, NP 02/11/2023, 1:41 PM [facility]

## 2023-02-11 NOTE — Patient Instructions (Signed)
You have been scheduled for a colonoscopy. Please follow written instructions given to you at your visit today.  Please pick up your prep supplies at the pharmacy within the next 1-3 days. If you use inhalers (even only as needed), please bring them with you on the day of your procedure.  The [facility] providers would like to encourage you to use [facility] to communicate with providers for non-urgent requests or questions.  Due to long hold times on the telephone, sending your provider a message by [facility] may be a faster and more efficient way to get a response.  Please allow 48 business hours for a response.  Please remember that this is for non-urgent requests.   Due to recent changes in healthcare laws, you may see the results of your imaging and laboratory studies on MyChart before your provider has had a chance to review them.  We understand that in some cases there may be results that are confusing or concerning to you. Not all laboratory results come back in the same time frame and the provider may be waiting for multiple results in order to interpret others.  Please give [place] 48 hours in order for your provider to thoroughly review all the results before contacting the office for clarification of your results.   The [facility] providers would like to encourage you to use [facility] to communicate with providers for non-urgent requests or questions.  Due to long hold times on the telephone, sending your provider a message by [facility] may be a faster and more efficient way to get a response.  Please allow 48 business hours for a response.  Please remember that this is for non-urgent requests.   Due to recent changes in healthcare laws, you may see the results of your imaging and laboratory studies on MyChart before your provider has had a chance to review them.  We understand that in some cases there may be results that are confusing or concerning to you. Not all laboratory results come back in the same  time frame and the provider may be waiting for multiple results in order to interpret others.  Please give [place] 48 hours in order for your provider to thoroughly review all the results before contacting the office for clarification of your results.   Thank you for choosing me and [facility].  Venita Lick. Pleas Koch., MD., Clementeen Graham

## 2023-02-12 ENCOUNTER — Other Ambulatory Visit: Payer: Self-pay | Admitting: Interventional Cardiology

## 2023-02-12 ENCOUNTER — Ambulatory Visit: Payer: 59 | Attending: Nurse Practitioner | Admitting: Nurse Practitioner

## 2023-02-12 ENCOUNTER — Ambulatory Visit (INDEPENDENT_AMBULATORY_CARE_PROVIDER_SITE_OTHER): Payer: 59

## 2023-02-12 ENCOUNTER — Encounter: Payer: Self-pay | Admitting: Nurse Practitioner

## 2023-02-12 VITALS — BP 124/84 | HR 98 | Ht 68.0 in | Wt 196.4 lb

## 2023-02-12 DIAGNOSIS — I5022 Chronic systolic (congestive) heart failure: Secondary | ICD-10-CM

## 2023-02-12 DIAGNOSIS — I2510 Atherosclerotic heart disease of native coronary artery without angina pectoris: Secondary | ICD-10-CM

## 2023-02-12 DIAGNOSIS — I10 Essential (primary) hypertension: Secondary | ICD-10-CM

## 2023-02-12 DIAGNOSIS — R42 Dizziness and giddiness: Secondary | ICD-10-CM | POA: Diagnosis not present

## 2023-02-12 DIAGNOSIS — Z01810 Encounter for preprocedural cardiovascular examination: Secondary | ICD-10-CM

## 2023-02-12 DIAGNOSIS — I739 Peripheral vascular disease, unspecified: Secondary | ICD-10-CM

## 2023-02-12 DIAGNOSIS — R55 Syncope and collapse: Secondary | ICD-10-CM

## 2023-02-12 LAB — TSH: TSH: 1.7 u[IU]/mL (ref 0.450–4.500)

## 2023-02-12 LAB — MAGNESIUM: Magnesium: 2.2 mg/dL (ref 1.6–2.3)

## 2023-02-12 NOTE — Patient Instructions (Signed)
Medication Instructions:  Your physician recommends that you continue on your current medications as directed. Please refer to the Current Medication list given to you today. *If you need a refill on your cardiac medications before your next appointment, please call your pharmacy*   Lab Work: TODAY-MAG & TSH  If you have labs (blood work) drawn today and your tests are completely normal, you will receive your results only by: MyChart Message (if you have MyChart) OR A paper copy in the mail If you have any lab test that is abnormal or we need to change your treatment, we will call you to review the results.   Testing/Procedures: Martin Archer- Long Term Monitor Instructions  Your physician has requested you wear a ZIO patch monitor for 14 days.  This is a single patch monitor. [employer] supplies one patch monitor per enrollment. Additional stickers are not available. Please do not apply patch if you will be having a Nuclear Stress Test,  Echocardiogram, Cardiac CT, MRI, or Chest Xray during the period you would be wearing the  monitor. The patch cannot be worn during these tests. You cannot remove and re-apply the  ZIO XT patch monitor.  Your ZIO patch monitor will be mailed 3 day USPS to your address on file. It may take 3-5 days  to receive your monitor after you have been enrolled.  Once you have received your monitor, please review the enclosed instructions. Your monitor  has already been registered assigning a specific monitor serial # to you.  Billing and Patient Assistance Program Information  We have supplied [employer] with any of your insurance information on file for billing purposes. [employer] offers a sliding scale Patient Assistance Program for patients that do not have  insurance, or whose insurance does not completely cover the cost of the ZIO monitor.  You must apply for the Patient Assistance Program to qualify for this discounted rate.  To apply, please call [employer] at  [phone], select option 4, select option 2, ask to apply for  Patient Assistance Program. Meredeth Ide will ask your household income, and how many people  are in your household. They will quote your out-of-pocket cost based on that information.  [employer] will also be able to set up a 23-month, interest-free payment plan if needed.  Applying the monitor   Shave hair from upper left chest.  Hold abrader disc by orange tab. Rub abrader in 40 strokes over the upper left chest as  indicated in your monitor instructions.  Clean area with 4 enclosed alcohol pads. Let dry.  Apply patch as indicated in monitor instructions. Patch will be placed under collarbone on left  side of chest with arrow pointing upward.  Rub patch adhesive wings for 2 minutes. Remove white label marked "1". Remove the white  label marked "2". Rub patch adhesive wings for 2 additional minutes.  While looking in a mirror, press and release button in center of patch. A small green light will  flash 3-4 times. This will be your only indicator that the monitor has been turned on.  Do not shower for the first 24 hours. You may shower after the first 24 hours.  Press the button if you feel a symptom. You will hear a small click. Record Date, Time and  Symptom in the Patient Logbook.  When you are ready to remove the patch, follow instructions on the last 2 pages of Patient  Logbook. Stick patch monitor onto the last page of Patient Logbook.  Place Patient  Logbook in the blue and white box. Use locking tab on box and tape box closed  securely. The blue and white box has prepaid postage on it. Please place it in the mailbox as  soon as possible. Your physician should have your test results approximately 7 days after the  monitor has been mailed back to [facility].  Call [facility] Customer Care at [phone] if you have questions regarding  your ZIO XT patch monitor. Call them immediately if you see an orange light  blinking on your  monitor.  If your monitor falls off in less than 4 days, contact our Monitor department at [phone].  If your monitor becomes loose or falls off after 4 days call [employer] at [phone] for  suggestions on securing your monitor   Follow-Up: At [facility], you and your health needs are our priority.  As part of our continuing mission to provide you with exceptional heart care, we have created designated Provider Care Teams.  These Care Teams include your primary Cardiologist (physician) and Advanced Practice Providers (APPs -  Physician Assistants and Nurse Practitioners) who all work together to provide you with the care you need, when you need it.  We recommend signing up for the patient portal called "MyChart".  Sign up information is provided on this After Visit Summary.  MyChart is used to connect with patients for Virtual Visits (Telemedicine).  Patients are able to view lab/test results, encounter notes, upcoming appointments, etc.  Non-urgent messages can be sent to your provider as well.   To learn more about what you can do with MyChart, go to [email or web address].    Your next appointment:   6 month(s)  Provider:   Lance Muss, MD     Other Instructions  Stay hydrated and drink 64 ozs of fluid per day

## 2023-02-12 NOTE — Progress Notes (Unsigned)
Enrolled patient for a 7 day Zio XT monitor to be mailed to patients home  [place] to read 

## 2023-02-19 ENCOUNTER — Ambulatory Visit (INDEPENDENT_AMBULATORY_CARE_PROVIDER_SITE_OTHER): Payer: 59

## 2023-02-19 VITALS — Ht 68.0 in | Wt 196.0 lb

## 2023-02-19 DIAGNOSIS — Z0000 Encounter for general adult medical examination without abnormal findings: Secondary | ICD-10-CM | POA: Diagnosis not present

## 2023-02-19 NOTE — Telephone Encounter (Signed)
Okay to hold Plavix 5 days before. 

## 2023-02-19 NOTE — Telephone Encounter (Signed)
     Primary Cardiologist: Lance Muss, MD  Chart reviewed as part of pre-operative protocol coverage. Given past medical history and time since last visit, based on ACC/AHA guidelines, Martin Archer would be at acceptable risk for the planned procedure without further cardiovascular testing.   -Patient is RCRI score is 11% -The patient affirms he has been doing well without any new cardiac symptoms.  He is able to achieve 6 METS without cardiac limitations. The patient was advised that if he develops new symptoms prior to surgery to contact our office to arrange for a follow-up visit, and he verbalized understanding.   His Plavix may be held for 5 days prior to his procedure.  Please resume as soon as hemostasis is achieved.  I will route this recommendation to the requesting party via Epic fax function and remove from pre-op pool.  Please call with questions.  Thomasene Ripple. Nhia Heaphy NP-C     02/19/2023, 7:01 AM [facility] [address] Office [phone] Fax [phone]

## 2023-02-19 NOTE — Progress Notes (Signed)
Subjective:   Martin Archer is a 62 y.o. male who presents for Medicare Annual/Subsequent preventive examination.  Visit Complete: Virtual  I connected with  Martin Archer on 02/19/23 by a audio enabled telemedicine application and verified that I am speaking with the correct person using two identifiers.  Patient Location: Home  Provider Location: Office/Clinic  I discussed the limitations of evaluation and management by telemedicine. The patient expressed understanding and agreed to proceed.  Patient Medicare AWV questionnaire was completed by the patient on 02/16/2023; I have confirmed that all information answered by patient is correct and no changes since this date.  Review of Systems     Cardiac Risk Factors include: advanced age (>65men, >67 women);dyslipidemia;family history of premature cardiovascular disease;hypertension;male gender     Objective:    Today's Vitals   02/19/23 0950 02/19/23 0951  Weight: 196 lb (88.9 kg)   Height: 5\' 8"  (1.727 m)   PainSc: 5  5   PainLoc: Leg    Body mass index is 29.8 kg/m.     02/19/2023    9:53 AM 03/22/2022    8:55 AM 06/23/2021   10:34 AM 04/26/2021    3:00 PM 04/26/2021    6:26 AM 01/25/2021    9:02 AM 10/19/2017   10:19 AM  Advanced Directives  Does Patient Have a Medical Advance Directive? Yes Yes Yes Yes Yes Yes No  Type of Estate agent of [place];Living will Living will;Healthcare Power of [employer] Power of [place];Living will Healthcare Power of [place];Living will Healthcare Power of [place];Living will Healthcare Power of [place];Living will   Does patient want to make changes to medical advance directive?  No - Patient declined No - Patient declined No - Patient declined  No - Patient declined   Copy of Healthcare Power of Attorney in Chart? No - copy requested No - copy requested  No - copy requested  No - copy requested     Current Medications  (verified) Outpatient Encounter Medications as of 02/19/2023  Medication Sig   acetaminophen (TYLENOL) 500 MG tablet Take 500 mg by mouth every 6 (six) hours as needed.   amLODipine (NORVASC) 10 MG tablet Take 10 mg by mouth daily.   aspirin EC 81 MG tablet Take 1 tablet (81 mg total) by mouth daily. Swallow whole.   atorvastatin (LIPITOR) 80 MG tablet TAKE 1 TABLET BY MOUTH DAILY   Carboxymethylcellul-Glycerin (LUBRICATING EYE DROPS OP) Place 1 drop into both eyes daily as needed (dry eyes).   carvedilol (COREG) 25 MG tablet TAKE 1 TABLET(25 MG) BY MOUTH TWICE DAILY   clopidogrel (PLAVIX) 75 MG tablet TAKE 1 TABLET BY MOUTH DAILY WITH BREAKFAST   doxylamine, Sleep, (UNISOM) 25 MG tablet Take 25 mg by mouth at bedtime as needed for sleep.   Dulaglutide (TRULICITY) 0.75 MG/0.5ML SOPN Inject 0.75 mg into the skin once a week.   empagliflozin (JARDIANCE) 10 MG TABS tablet Take 1 tablet (10 mg total) by mouth daily before breakfast.   ezetimibe (ZETIA) 10 MG tablet TAKE 1 TABLET(10 MG) BY MOUTH DAILY   fluorouracil (EFUDEX) 5 % cream Apply topically 2 (two) times daily.   furosemide (LASIX) 20 MG tablet TAKE 1 TABLET(20 MG) BY MOUTH DAILY AS NEEDED FOR LOWER EXTREMITY SWELLING   Multiple Vitamin (MULTIVITAMIN WITH MINERALS) TABS tablet Take 1 tablet by mouth daily.   sacubitril-valsartan (ENTRESTO) 97-103 MG TAKE 1 TABLET BY MOUTH TWICE DAILY   sildenafil (VIAGRA) 100 MG tablet Take 0.5-1 tablets (  50-100 mg total) by mouth daily as needed for erectile dysfunction.   tirzepatide [facility]) 5 MG/0.5ML Pen Inject 5 mg into the skin once a week.   No facility-administered encounter medications on file as of 02/19/2023.    Allergies (verified) Adderall [amphetamine-dextroamphet er], Prozac [fluoxetine hcl], Sertraline hcl, and Advil [ibuprofen]   History: Past Medical History:  Diagnosis Date   Anemia, unspecified    Arthritis    Diverticulosis of colon (without mention of hemorrhage)     Gastric ulcer with hemorrhage and obstruction 2011   Dr [facility]   GERD (gastroesophageal reflux disease)    hx   Hyperlipemia    Hypertension    Personal history of colonic polyps 01/30/2010   hyperplastic rectal   Sleep apnea    on CPAP; [facility]   Stroke [facility])    mini strokes-bilateral carotid endarterectomy   Substance abuse (HCC)    History of alcohol abuse   Past Surgical History:  Procedure Laterality Date   AORTIC ARCH ANGIOGRAPHY N/A 04/26/2021   Procedure: AORTIC ARCH ANGIOGRAPHY;  Surgeon: Iran Ouch, MD;  [facility]: [facility] INVASIVE CV LAB;  Service: Cardiovascular;  Laterality: N/A;   CARDIAC CATHETERIZATION  01/25/2021   CAROTID ENDARTERECTOMY  11/2006   left Dr Jerilee Field   COLONOSCOPY  2011   DP-positive FOBT/IDA   ENDARTERECTOMY Right 01/19/2015   Procedure: RIGHT CAROTID ENDARTERECTOMY WITH PATCH ANGIOPLASTY;  Surgeon: Pryor Ochoa, MD;  Location: [facility] OR;  Service: Vascular;  Laterality: Right;   ENDARTERECTOMY Right 06/27/2021   Procedure: RIGHT REDO CAROTID ARTERY ENDARTERECTOMY & PLACEMENT OF 15Fr DRAIN;  Surgeon: Chuck Hint, MD;  Location: [facility] OR;  Service: Vascular;  Laterality: Right;   ESOPHAGOGASTRODUODENOSCOPY Left 06/17/2013   Procedure: ESOPHAGOGASTRODUODENOSCOPY (EGD);  Surgeon: Willis Modena, MD;  Location: [facility] ENDOSCOPY;  Service: Endoscopy;  Laterality: Left;   ESOPHAGOGASTRODUODENOSCOPY N/A 09/20/2015   Procedure: ESOPHAGOGASTRODUODENOSCOPY (EGD);  Surgeon: Meryl Dare, MD;  Location: [facility] ENDOSCOPY;  Service: Endoscopy;  Laterality: N/A;   FOOT SURGERY Bilateral 2020   Taylor's foot   INGUINAL HERNIA REPAIR  1963   NASAL FRACTURE SURGERY  1974   PATCH ANGIOPLASTY Right 06/27/2021   Procedure: PATCH ANGIOPLASTY USING Livia Snellen BIOLOGIC PATCH;  Surgeon: Chuck Hint, MD;  Location: [facility] OR;  Service: Vascular;  Laterality: Right;   PERIPHERAL VASCULAR INTERVENTION  04/26/2021   Procedure: PERIPHERAL  VASCULAR INTERVENTION;  Surgeon: Iran Ouch, MD;  Location: [facility] INVASIVE CV LAB;  Service: Cardiovascular;;   RIGHT/LEFT HEART CATH AND CORONARY ANGIOGRAPHY N/A 01/25/2021   Procedure: RIGHT/LEFT HEART CATH AND CORONARY ANGIOGRAPHY;  Surgeon: Corky Crafts, MD;  Location: [facility] INVASIVE CV LAB;  Service: Cardiovascular;  Laterality: N/A;   TEE WITHOUT CARDIOVERSION N/A 01/17/2015   Procedure: TRANSESOPHAGEAL ECHOCARDIOGRAM (TEE);  Surgeon: Thurmon Fair, MD;  Location: [facility] ENDOSCOPY;  Service: Cardiovascular;  Laterality: N/A;   WISDOM TOOTH EXTRACTION     Family History  Problem Relation Age of Onset   Diabetes Father    Hypertension Father    Stroke Father        in late 50s   Heart attack Mother        in 24s   Heart attack Brother 33   Diabetes Paternal Grandfather    Cancer Maternal Grandfather        unknown type   Heart attack Maternal Grandfather 1   Stroke Paternal Uncle        in late 48s   Colon  cancer Neg Hx    Esophageal cancer Neg Hx    Stomach cancer Neg Hx    Colon polyps Neg Hx    Rectal cancer Neg Hx    Social History   Socioeconomic History   Marital status: Divorced    Spouse name: Not on file   Number of children: 2   Years of education: Not on file   Highest education level: Not on file  Occupational History   Occupation: self employeed Investment banker, corporate: [employer]   Tobacco Use   Smoking status: Former    Packs/day: .5    Types: Cigarettes    Quit date: 06/22/2015    Years since quitting: 7.6   Smokeless tobacco: Never   Tobacco comments:    smoked 1976-2007 & 2009- present , up to 2 ppd, average > 1ppd  Vaping Use   Vaping Use: Never used  Substance and Sexual Activity   Alcohol use: Not Currently    Alcohol/week: 0.0 standard drinks of alcohol   Drug use: No   Sexual activity: Yes  Other Topics Concern   Not on file  Social History Narrative   Lives alone.  Daughter lives in area.    Social Determinants of Health    Financial Resource Strain: Low Risk  (02/19/2023)   Overall Financial Resource Strain (CARDIA)    Difficulty of Paying Living Expenses: Not very hard  Food Insecurity: No Food Insecurity (02/19/2023)   Hunger Vital Sign    Worried About Running Out of Food in the Last Year: Never true    Ran Out of Food in the Last Year: Never true  Transportation Needs: No Transportation Needs (02/19/2023)   PRAPARE - Administrator, Civil Service (Medical): No    Lack of Transportation (Non-Medical): No  Physical Activity: Insufficiently Active (02/19/2023)   Exercise Vital Sign    Days of Exercise per Week: 3 days    Minutes of Exercise per Session: 20 min  Stress: No Stress Concern Present (02/19/2023)   [employer] of Occupational Health - Occupational Stress Questionnaire    Feeling of Stress : Only a little  Social Connections: Unknown (02/19/2023)   Social Connection and Isolation Panel [NHANES]    Frequency of Communication with Friends and Family: More than three times a week    Frequency of Social Gatherings with Friends and Family: More than three times a week    Attends Religious Services: Patient declined    Database administrator or Organizations: Yes    Attends Engineer, structural: More than 4 times per year    Marital Status: Divorced    Tobacco Counseling Counseling given: Not Answered Tobacco comments: smoked 1976-2007 & 2009- present , up to 2 ppd, average > 1ppd   Clinical Intake:  Pre-visit preparation completed: Yes  Pain : 0-10 Pain Score: 5  Pain Type: Chronic pain Pain Location: Leg Pain Orientation: Left, Right     BMI - recorded: 29.8 Nutritional Status: BMI 25 -29 Overweight Nutritional Risks: None Diabetes: No  How often do you need to have someone help you when you read instructions, pamphlets, or other written materials from your doctor or pharmacy?: 1 - Never What is the last grade level you completed in school?:  HSG  Interpreter Needed?: No  Information entered by :: Susie Cassette, LPN.   Activities of Daily Living    02/19/2023    9:58 AM 02/16/2023    5:32 PM  In your present state of health, do you have any difficulty performing the following activities:  Hearing? 1 1  Vision? 0 0  Difficulty concentrating or making decisions? 0 0  Walking or climbing stairs? 0 0  Dressing or bathing? 0 0  Doing errands, shopping? 0 0  Preparing Food and eating ? N N  Using the Toilet? N N  In the past six months, have you accidently leaked urine? N N  Do you have problems with loss of bowel control? N N  Managing your Medications? N N  Managing your Finances? N N  Housekeeping or managing your Housekeeping? N N    Patient Care Team: Corwin Levins, MD as PCP - General (Internal Medicine) Corky Crafts, MD as PCP - Cardiology (Cardiology) Iran Ouch, MD as PCP - [facility] (Cardiology) [facility] as Consulting Physician (Optometry)  Indicate any recent Medical Services you may have received from other than [facility] providers in the past year (date may be approximate).     Assessment:   This is a routine wellness examination for Kade.  Hearing/Vision screen Hearing Screening - Comments:: Patient wears hearing aids. Vision Screening - Comments:: No glasses - up to date with routine eye exams with [facility]   Dietary issues and exercise activities discussed:     Goals Addressed             This Visit's Progress    My healthcare goal for 2024 is weight lose, gain more stamina and be pain free.        Depression Screen    02/19/2023    9:54 AM 10/01/2022   10:10 AM 03/28/2022    3:38 PM 03/22/2022    9:00 AM 09/20/2021   11:08 AM 12/19/2020   11:39 AM 03/23/2020    3:28 PM  PHQ 2/9 Scores  PHQ - 2 Score 0 0 0 0 0 0 0  PHQ- 9 Score 0 0 0        Fall Risk    02/19/2023    9:57 AM 02/16/2023    5:32 PM 10/01/2022   10:35 AM 10/01/2022    10:10 AM 03/28/2022    3:38 PM  Fall Risk   Falls in the past year? 0 0 0 0 0  Number falls in past yr: 0  0 0   Injury with Fall? 0   0   Risk for fall due to : No Fall Risks   No Fall Risks   Follow up Falls prevention discussed   Falls evaluation completed     MEDICARE RISK AT HOME:  Medicare Risk at Home - 02/19/23 0959     Any stairs in or around the home? Yes    If so, are there any without handrails? No    Home free of loose throw rugs in walkways, pet beds, electrical cords, etc? Yes    Adequate lighting in your home to reduce risk of falls? Yes    Life alert? No    Use of a cane, walker or w/c? No    Grab bars in the bathroom? No    Shower chair or bench in shower? No    Elevated toilet seat or a handicapped toilet? No             TIMED UP AND GO:  Was the test performed?  No    Cognitive Function:        02/19/2023    9:58 AM  03/22/2022    9:00 AM  6CIT Screen  What Year? 0 points 0 points  What month? 0 points 0 points  What time? 0 points 0 points  Count back from 20 0 points 0 points  Months in reverse 0 points 0 points  Repeat phrase 0 points 0 points  Total Score 0 points 0 points    Immunizations Immunization History  Administered Date(s) Administered   Influenza Whole 06/28/2010   Influenza,inj,Quad PF,6+ Mos 07/25/2017, 05/20/2019, 05/29/2021   Influenza-Unspecified 08/06/2022   Moderna Sars-Covid-2 Vaccination 12/25/2019, 01/22/2020, 06/20/2020   Pneumococcal Polysaccharide-23 08/01/2017   Td 08/27/2001, 10/02/2013    TDAP status: Up to date  Flu Vaccine status: Up to date  Pneumococcal vaccine status: Up to date  Covid-19 vaccine status: Completed vaccines  Qualifies for Shingles Vaccine? Yes   Zostavax completed No   Shingrix Completed?: No.    Education has been provided regarding the importance of this vaccine. Patient has been advised to call insurance company to determine out of pocket expense if they have not yet received  this vaccine. Advised may also receive vaccine at local pharmacy or Health Dept. Verbalized acceptance and understanding.  Screening Tests Health Maintenance  Topic Date Due   Zoster Vaccines- Shingrix (1 of 2) Never done   COVID-19 Vaccine (4 - 2023-24 season) 04/27/2022   Colonoscopy  10/02/2023 (Originally 01/28/2015)   INFLUENZA VACCINE  03/28/2023   HEMOGLOBIN A1C  04/01/2023   OPHTHALMOLOGY EXAM  08/07/2023   FOOT EXAM  10/02/2023   DTaP/Tdap/Td (3 - Tdap) 10/03/2023   Diabetic kidney evaluation - eGFR measurement  01/31/2024   Diabetic kidney evaluation - Urine ACR  01/31/2024   Medicare Annual Wellness (AWV)  02/19/2024   Hepatitis C Screening  Completed   HIV Screening  Completed   HPV VACCINES  Aged Out    Health Maintenance  Health Maintenance Due  Topic Date Due   Zoster Vaccines- Shingrix (1 of 2) Never done   COVID-19 Vaccine (4 - 2023-24 season) 04/27/2022    Colorectal cancer screening: Type of screening: Colonoscopy. Completed 01/27/2010. Repeat every 5 years-Patient is scheduled for 02/2023 with [facility].  Lung Cancer Screening: (Low Dose CT Chest recommended if Age 1-80 years, 20 pack-year currently smoking OR have quit w/in 15years.) does not qualify.   Lung Cancer Screening Referral: no  Additional Screening:  Hepatitis C Screening: does qualify; Completed 06/30/2015  Vision Screening: Recommended annual ophthalmology exams for early detection of glaucoma and other disorders of the eye. Is the patient up to date with their annual eye exam?  Yes  Who is the provider or what is the name of the office in which the patient attends annual eye exams? [facility] If pt is not established with a provider, would they like to be referred to a provider to establish care? No .   Dental Screening: Recommended annual dental exams for proper oral hygiene  Diabetic Foot Exam: Diabetic Foot Exam: Completed 10/01/2022  Community Resource Referral / Chronic  Care Management: CRR required this visit?  No   CCM required this visit?  No     Plan:     I have personally reviewed and noted the following in the patient's chart:   Medical and social history Use of alcohol, tobacco or illicit drugs  Current medications and supplements including opioid prescriptions. Patient is not currently taking opioid prescriptions. Functional ability and status Nutritional status Physical activity Advanced directives List of other physicians Hospitalizations, surgeries, and ER  visits in previous 12 months Vitals Screenings to include cognitive, depression, and falls Referrals and appointments  In addition, I have reviewed and discussed with patient certain preventive protocols, quality metrics, and best practice recommendations. A written personalized care plan for preventive services as well as general preventive health recommendations were provided to patient.     Mickeal Needy, LPN   [id number]   After Visit Summary: (Mail) Due to this being a telephonic visit, the after visit summary with patients personalized plan was offered to patient via mail   Nurse Notes: Normal cognitive status assessed by direct observation via telephone conversation by this Nurse Health Advisor. No abnormalities found.

## 2023-02-19 NOTE — Patient Instructions (Addendum)
Martin Archer , Thank you for taking time to come for your Medicare Wellness Visit. I appreciate your ongoing commitment to your health goals. Please review the following plan we discussed and let me know if I can assist you in the future.   These are the goals we discussed:  Goals      My healthcare goal for 2024 is weight lose, gain more stamina and be pain free.        This is a list of the screening recommended for you and due dates:  Health Maintenance  Topic Date Due   Zoster (Shingles) Vaccine (1 of 2) Never done   COVID-19 Vaccine (4 - 2023-24 season) 04/27/2022   Colon Cancer Screening  10/02/2023*   Flu Shot  03/28/2023   Hemoglobin A1C  04/01/2023   Eye exam for diabetics  08/07/2023   Complete foot exam   10/02/2023   DTaP/Tdap/Td vaccine (3 - Tdap) 10/03/2023   Yearly kidney function blood test for diabetes  01/31/2024   Yearly kidney health urinalysis for diabetes  01/31/2024   Medicare Annual Wellness Visit  02/19/2024   Hepatitis C Screening  Completed   HIV Screening  Completed   HPV Vaccine  Aged Out  *Topic was postponed. The date shown is not the original due date.    Advanced directives: Yes  Conditions/risks identified: Yes  Next appointment:   Preventive Care 40-64 Years, Male Preventive care refers to lifestyle choices and visits with your health care provider that can promote health and wellness. What does preventive care include? A yearly physical exam. This is also called an annual well check. Dental exams once or twice a year. Routine eye exams. Ask your health care provider how often you should have your eyes checked. Personal lifestyle choices, including: Daily care of your teeth and gums. Regular physical activity. Eating a healthy diet. Avoiding tobacco and drug use. Limiting alcohol use. Practicing safe sex. Taking low-dose aspirin every day starting at age 42. What happens during an annual well check? The services and screenings done  by your health care provider during your annual well check will depend on your age, overall health, lifestyle risk factors, and family history of disease. Counseling  Your health care provider may ask you questions about your: Alcohol use. Tobacco use. Drug use. Emotional well-being. Home and relationship well-being. Sexual activity. Eating habits. Work and work Astronomer. Screening  You may have the following tests or measurements: Height, weight, and BMI. Blood pressure. Lipid and cholesterol levels. These may be checked every 5 years, or more frequently if you are over 64 years old. Skin check. Lung cancer screening. You may have this screening every year starting at age 44 if you have a 30-pack-year history of smoking and currently smoke or have quit within the past 15 years. Fecal occult blood test (FOBT) of the stool. You may have this test every year starting at age 84. Flexible sigmoidoscopy or colonoscopy. You may have a sigmoidoscopy every 5 years or a colonoscopy every 10 years starting at age 58. Prostate cancer screening. Recommendations will vary depending on your family history and other risks. Hepatitis C blood test. Hepatitis B blood test. Sexually transmitted disease (STD) testing. Diabetes screening. This is done by checking your blood sugar (glucose) after you have not eaten for a while (fasting). You may have this done every 1-3 years. Discuss your test results, treatment options, and if necessary, the need for more tests with your health care provider. Vaccines  Your health care provider may recommend certain vaccines, such as: Influenza vaccine. This is recommended every year. Tetanus, diphtheria, and acellular pertussis (Tdap, Td) vaccine. You may need a Td booster every 10 years. Zoster vaccine. You may need this after age 19. Pneumococcal 13-valent conjugate (PCV13) vaccine. You may need this if you have certain conditions and have not been  vaccinated. Pneumococcal polysaccharide (PPSV23) vaccine. You may need one or two doses if you smoke cigarettes or if you have certain conditions. Talk to your health care provider about which screenings and vaccines you need and how often you need them. This information is not intended to replace advice given to you by your health care provider. Make sure you discuss any questions you have with your health care provider. Document Released: 09/09/2015 Document Revised: 05/02/2016 Document Reviewed: 06/14/2015 Elsevier Interactive Patient Education  2017 [employer].  Fall Prevention in the Home Falls can cause injuries. They can happen to people of all ages. There are many things you can do to make your home safe and to help prevent falls. What can I do on the outside of my home? Regularly fix the edges of walkways and driveways and fix any cracks. Remove anything that might make you trip as you walk through a door, such as a raised step or threshold. Trim any bushes or trees on the path to your home. Use bright outdoor lighting. Clear any walking paths of anything that might make someone trip, such as rocks or tools. Regularly check to see if handrails are loose or broken. Make sure that both sides of any steps have handrails. Any raised decks and porches should have guardrails on the edges. Have any leaves, snow, or ice cleared regularly. Use sand or salt on walking paths during winter. Clean up any spills in your garage right away. This includes oil or grease spills. What can I do in the bathroom? Use night lights. Install grab bars by the toilet and in the tub and shower. Do not use towel bars as grab bars. Use non-skid mats or decals in the tub or shower. If you need to sit down in the shower, use a plastic, non-slip stool. Keep the floor dry. Clean up any water that spills on the floor as soon as it happens. Remove soap buildup in the tub or shower regularly. Attach bath mats  securely with double-sided non-slip rug tape. Do not have throw rugs and other things on the floor that can make you trip. What can I do in the bedroom? Use night lights. Make sure that you have a light by your bed that is easy to reach. Do not use any sheets or blankets that are too big for your bed. They should not hang down onto the floor. Have a firm chair that has side arms. You can use this for support while you get dressed. Do not have throw rugs and other things on the floor that can make you trip. What can I do in the kitchen? Clean up any spills right away. Avoid walking on wet floors. Keep items that you use a lot in easy-to-reach places. If you need to reach something above you, use a strong step stool that has a grab bar. Keep electrical cords out of the way. Do not use floor polish or wax that makes floors slippery. If you must use wax, use non-skid floor wax. Do not have throw rugs and other things on the floor that can make you trip. What can I  do with my stairs? Do not leave any items on the stairs. Make sure that there are handrails on both sides of the stairs and use them. Fix handrails that are broken or loose. Make sure that handrails are as long as the stairways. Check any carpeting to make sure that it is firmly attached to the stairs. Fix any carpet that is loose or worn. Avoid having throw rugs at the top or bottom of the stairs. If you do have throw rugs, attach them to the floor with carpet tape. Make sure that you have a light switch at the top of the stairs and the bottom of the stairs. If you do not have them, ask someone to add them for you. What else can I do to help prevent falls? Wear shoes that: Do not have high heels. Have rubber bottoms. Are comfortable and fit you well. Are closed at the toe. Do not wear sandals. If you use a stepladder: Make sure that it is fully opened. Do not climb a closed stepladder. Make sure that both sides of the stepladder  are locked into place. Ask someone to hold it for you, if possible. Clearly mark and make sure that you can see: Any grab bars or handrails. First and last steps. Where the edge of each step is. Use tools that help you move around (mobility aids) if they are needed. These include: Canes. Walkers. Scooters. Crutches. Turn on the lights when you go into a dark area. Replace any light bulbs as soon as they burn out. Set up your furniture so you have a clear path. Avoid moving your furniture around. If any of your floors are uneven, fix them. If there are any pets around you, be aware of where they are. Review your medicines with your doctor. Some medicines can make you feel dizzy. This can increase your chance of falling. Ask your doctor what other things that you can do to help prevent falls. This information is not intended to replace advice given to you by your health care provider. Make sure you discuss any questions you have with your health care provider. Document Released: 06/09/2009 Document Revised: 01/19/2016 Document Reviewed: 09/17/2014 Elsevier Interactive Patient Education  2017 [employer].

## 2023-02-19 NOTE — Telephone Encounter (Signed)
Spoke with patient and informed him to hold Plavix 5 days prior to his procedure. Patient verbalized understanding.

## 2023-02-19 NOTE — Telephone Encounter (Signed)
Left message for patient to return my call.

## 2023-02-22 ENCOUNTER — Encounter: Payer: Self-pay | Admitting: Gastroenterology

## 2023-03-08 DIAGNOSIS — Z6833 Body mass index (BMI) 33.0-33.9, adult: Secondary | ICD-10-CM | POA: Insufficient documentation

## 2023-03-12 ENCOUNTER — Telehealth: Payer: Self-pay | Admitting: Gastroenterology

## 2023-03-12 ENCOUNTER — Encounter: Payer: Self-pay | Admitting: Gastroenterology

## 2023-03-12 ENCOUNTER — Ambulatory Visit (AMBULATORY_SURGERY_CENTER): Payer: 59 | Admitting: Gastroenterology

## 2023-03-12 VITALS — BP 126/74 | HR 85 | Temp 98.6°F | Resp 15 | Ht 68.0 in | Wt 196.6 lb

## 2023-03-12 DIAGNOSIS — D122 Benign neoplasm of ascending colon: Secondary | ICD-10-CM | POA: Diagnosis not present

## 2023-03-12 DIAGNOSIS — K635 Polyp of colon: Secondary | ICD-10-CM

## 2023-03-12 DIAGNOSIS — D125 Benign neoplasm of sigmoid colon: Secondary | ICD-10-CM

## 2023-03-12 DIAGNOSIS — Z1211 Encounter for screening for malignant neoplasm of colon: Secondary | ICD-10-CM | POA: Diagnosis not present

## 2023-03-12 MED ORDER — SODIUM CHLORIDE 0.9 % IV SOLN
500.0000 mL | Freq: Once | INTRAVENOUS | Status: DC
Start: 2023-03-12 — End: 2023-03-12

## 2023-03-12 NOTE — Progress Notes (Signed)
See 02/11/2023 H&P no changes

## 2023-03-12 NOTE — Progress Notes (Signed)
Pt says he is having no symptoms of low blood sugar (77) during admitting .

## 2023-03-12 NOTE — Progress Notes (Signed)
 Called to room to assist during endoscopic procedure.  Patient ID and intended procedure confirmed with present staff. Received instructions for my participation in the procedure from the performing physician.  

## 2023-03-12 NOTE — Op Note (Signed)
[facility] Patient Name: Martin Archer Procedure Date: 03/12/2023 2:42 PM MRN: [id number] Endoscopist: Meryl Dare , MD, [phone] Age: 62 Referring MD:  Date of Birth: Aug 27, 1961 Gender: Male Account #: [id number] Procedure:                Colonoscopy Indications:              Screening for colorectal malignant neoplasm Medicines:                Monitored Anesthesia Care Procedure:                Pre-Anesthesia Assessment:                           - Prior to the procedure, a History and Physical                            was performed, and patient medications and                            allergies were reviewed. The patient's tolerance of                            previous anesthesia was also reviewed. The risks                            and benefits of the procedure and the sedation                            options and risks were discussed with the patient.                            All questions were answered, and informed consent                            was obtained. Prior Anticoagulants: The patient has                            taken Plavix (clopidogrel), last dose was 5 days                            prior to procedure. ASA Grade Assessment: III - A                            patient with severe systemic disease. After                            reviewing the risks and benefits, the patient was                            deemed in satisfactory condition to undergo the                            procedure.  After obtaining informed consent, the colonoscope                            was passed under direct vision. Throughout the                            procedure, the patient's blood pressure, pulse, and                            oxygen saturations were monitored continuously. The                            CF [id number] #[id number] was introduced through the anus                            and advanced to the the cecum,  identified by                            appendiceal orifice and ileocecal valve. The                            ileocecal valve, appendiceal orifice, and rectum                            were photographed. The quality of the bowel                            preparation was good. The colonoscopy was performed                            without difficulty. The patient tolerated the                            procedure well. Scope In: 2:48:28 PM Scope Out: 3:02:17 PM Scope Withdrawal Time: 0 hours 10 minutes 58 seconds  Total Procedure Duration: 0 hours 13 minutes 49 seconds  Findings:                 The perianal and digital rectal examinations were                            normal.                           A 10 mm polyp was found in the ascending colon. The                            polyp was semi-pedunculated. The polyp was removed                            with a hot snare. Resection and retrieval were                            complete.  A 4 mm polyp was found in the sigmoid colon. The                            polyp was sessile. The polyp was removed with a                            cold snare. Resection and retrieval were complete.                           Multiple small-mouthed diverticula were found in                            the sigmoid colon, descending colon and ascending                            colon. There was no evidence of diverticular                            bleeding.                           Internal hemorrhoids were found during                            retroflexion. The hemorrhoids were small and Grade                            I (internal hemorrhoids that do not prolapse).                           The exam was otherwise without abnormality on                            direct and retroflexion views. Complications:            No immediate complications. Estimated blood loss:                            None. Estimated  Blood Loss:     Estimated blood loss: none. Impression:               - One 10 mm polyp in the ascending colon, removed                            with a hot snare. Resected and retrieved.                           - One 4 mm polyp in the sigmoid colon, removed with                            a cold snare. Resected and retrieved.                           - Mild diverticulosis in the sigmoid colon, in the  descending colon and in the ascending colon.                           - Internal hemorrhoids.                           - The examination was otherwise normal on direct                            and retroflexion views. Recommendation:           - Repeat colonoscopy after studies are complete for                            surveillance based on pathology results.                           - Resume Plavix (clopidogrel) tomorrow at prior                            dose. Refer to managing physician for further                            adjustment of therapy.                           - Patient has a contact number available for                            emergencies. The signs and symptoms of potential                            delayed complications were discussed with the                            patient. Return to normal activities tomorrow.                            Written discharge instructions were provided to the                            patient.                           - High fiber diet.                           - Continue present medications.                           - Await pathology results. Meryl Dare, MD 03/12/2023 3:06:53 PM This report has been signed electronically.

## 2023-03-12 NOTE — Progress Notes (Signed)
 Pt's states no medical or surgical changes since previsit or office visit. 

## 2023-03-12 NOTE — Patient Instructions (Signed)
YOU HAD AN ENDOSCOPIC PROCEDURE TODAY AT THE [facility]:   Refer to the procedure report that was given to you for any specific questions about what was found during the examination.  If the procedure report does not answer your questions, please call your gastroenterologist to clarify.  If you requested that your care partner not be given the details of your procedure findings, then the procedure report has been included in a sealed envelope for you to review at your convenience later.  **handouts given on polyps, hemorrhoids, diverticulosis and high fiber diet**  YOU SHOULD EXPECT: Some feelings of bloating in the abdomen. Passage of more gas than usual.  Walking can help get rid of the air that was put into your GI tract during the procedure and reduce the bloating. If you had a lower endoscopy (such as a colonoscopy or flexible sigmoidoscopy) you may notice spotting of blood in your stool or on the toilet paper. If you underwent a bowel prep for your procedure, you may not have a normal bowel movement for a few days.  Please Note:  You might notice some irritation and congestion in your nose or some drainage.  This is from the oxygen used during your procedure.  There is no need for concern and it should clear up in a day or so.  SYMPTOMS TO REPORT IMMEDIATELY:  Following lower endoscopy (colonoscopy or flexible sigmoidoscopy):  Excessive amounts of blood in the stool  Significant tenderness or worsening of abdominal pains  Swelling of the abdomen that is new, acute  Fever of 100F or higher  For urgent or emergent issues, a gastroenterologist can be reached at any hour by calling [phone]. Do not use MyChart messaging for urgent concerns.    DIET:  We do recommend a small meal at first, but then you may proceed to your regular diet.  Drink plenty of fluids but you should avoid alcoholic beverages for 24 hours.  ACTIVITY:  You should plan to take it easy for the rest of  today and you should NOT DRIVE or use heavy machinery until tomorrow (because of the sedation medicines used during the test).    FOLLOW UP: Our staff will call the number listed on your records the next business day following your procedure.  We will call around 7:15- 8:00 am to check on you and address any questions or concerns that you may have regarding the information given to you following your procedure. If we do not reach you, we will leave a message.     If any biopsies were taken you will be contacted by phone or by letter within the next 1-3 weeks.  Please call us at [phone] if you have not heard about the biopsies in 3 weeks.    SIGNATURES/CONFIDENTIALITY: You and/or your care partner have signed paperwork which will be entered into your electronic medical record.  These signatures attest to the fact that that the information above on your After Visit Summary has been reviewed and is understood.  Full responsibility of the confidentiality of this discharge information lies with you and/or your care-partner.

## 2023-03-12 NOTE — Progress Notes (Signed)
When CRna Picked pt up in adm informed him of pt being on [place] and Plavix and last dose of each as stated by pt .

## 2023-03-12 NOTE — Telephone Encounter (Signed)
PT is returning call. He doesn't want to come in early for procedure today because his ride wont be able to bring him before 230pm

## 2023-03-13 ENCOUNTER — Telehealth: Payer: Self-pay | Admitting: *Deleted

## 2023-03-13 NOTE — Telephone Encounter (Signed)
 Post procedure follow up call placed, no answer and left VM.  

## 2023-03-25 ENCOUNTER — Encounter: Payer: Self-pay | Admitting: Gastroenterology

## 2023-04-02 ENCOUNTER — Ambulatory Visit: Payer: Self-pay | Admitting: Licensed Clinical Social Worker

## 2023-04-02 NOTE — Patient Instructions (Signed)
Visit Information  Thank you for taking time to visit with me today. Please don't hesitate to contact me if I can be of assistance to you.   Following are the goals we discussed today:   Goals Addressed             This Visit's Progress    Patient encouraged to talk with PCP about program support. Encouraged client to think about program support in the future if needed       Interventions:  Spoke with client about client needs Discussed program support with RN, LCSW, Pharmacist  Discussed client support with PCP, Dr. Corwin Levins Encouraged client to talk with PCP about program support Client said he was doing well now and did not think he needed program support at present.  But, he will keep program in mind if he should need it in future months. He wrote down name of program. Thanked client for phone call with LCSW today Encouraged client to call LCSW as needed for further program information [phone]) Client was appreciative of LCSW phone call today       No further intervention required.   Please call the care guide team at [phone] if you need to cancel or reschedule your appointment.   If you are experiencing a Mental Health or Behavioral Health Crisis or need someone to talk to, please go to [facility] [address], [place] [phone])   The patient verbalized understanding of instructions, educational materials, and care plan provided today and DECLINED offer to receive copy of patient instructions, educational materials, and care plan.   The patient has been provided with contact information for the care management team and has been advised to call with any health related questions or concerns.   Kelton Pillar. MSW, LCSW Licensed Visual merchandiser [facility] Care Management [phone]

## 2023-04-02 NOTE — Patient Outreach (Signed)
  Care Coordination   Initial Visit Note   04/02/2023 Name: Martin Archer MRN: [id number] DOB: 1960/09/30  Martin Archer is a 62 y.o. year old male who sees Corwin Levins, MD for primary care. I spoke with  Martin Archer by phone today.  What matters to the patients health and wellness today?  Patient encouraged to talk with PCP about program support. Encouraged client to think about program support in the future if needed    Goals Addressed             This Visit's Progress    Patient encouraged to talk with PCP about program support. Encouraged client to think about program support in the future if needed       Interventions:  Spoke with client about client needs Discussed program support with RN, LCSW, Pharmacist  Discussed client support with PCP, Dr. Corwin Levins Encouraged client to talk with PCP about program support Client said he was doing well now and did not think he needed program support at present.  But, he will keep program in mind if he should need it in future months. He wrote down name of program. Thanked client for phone call with LCSW today Encouraged client to call LCSW as needed for further program information [phone]) Client was appreciative of LCSW phone call today        SDOH assessments and interventions completed:  Yes  SDOH Interventions Today    Flowsheet Row Most Recent Value  SDOH Interventions   Physical Activity Interventions Other (Comments)  [may have some mobility challenges]  Stress Interventions Other (Comment)  [may have stress in managing medical needs]        Care Coordination Interventions:  Yes, provided   Interventions Today    Flowsheet Row Most Recent Value  Chronic Disease   Chronic disease during today's visit Other  [spoke with client about client needs]  General Interventions   General Interventions Discussed/Reviewed General Interventions Discussed, Community Resources  [reviewed program  support]  Education Interventions   Education Provided Provided Education  Provided Verbal Education On [employer]  [discussed RN support, Pharmacy support, LCSW support]  Mental Health Interventions   Mental Health Discussed/Reviewed Coping Strategies  [no mood issues noted]        Follow up plan: No further intervention required.   Encounter Outcome:  Pt. Visit Completed   Kelton Pillar. MSW, LCSW Licensed Visual merchandiser [facility] Care Management [phone]

## 2023-04-15 ENCOUNTER — Encounter: Payer: Self-pay | Admitting: Family Medicine

## 2023-04-15 ENCOUNTER — Ambulatory Visit (INDEPENDENT_AMBULATORY_CARE_PROVIDER_SITE_OTHER): Payer: 59

## 2023-04-15 ENCOUNTER — Ambulatory Visit (INDEPENDENT_AMBULATORY_CARE_PROVIDER_SITE_OTHER): Payer: 59 | Admitting: Family Medicine

## 2023-04-15 VITALS — BP 122/88 | HR 89 | Ht 68.0 in | Wt 181.0 lb

## 2023-04-15 DIAGNOSIS — G8929 Other chronic pain: Secondary | ICD-10-CM

## 2023-04-15 DIAGNOSIS — M5442 Lumbago with sciatica, left side: Secondary | ICD-10-CM

## 2023-04-15 MED ORDER — TIZANIDINE HCL 4 MG PO TABS: 4.0000 mg | ORAL_TABLET | Freq: Three times a day (TID) | ORAL | 1 refills | Status: DC | PRN

## 2023-04-15 MED ORDER — PREDNISONE 50 MG PO TABS: 50.0000 mg | ORAL_TABLET | Freq: Every day | ORAL | 0 refills | Status: DC

## 2023-04-15 NOTE — Progress Notes (Signed)
I, Stevenson Clinch, CMA acting as a scribe for Clementeen Graham, MD.  Martin Archer is a 61 y.o. male who presents to [facility] at [facility] today for re-occurring low back pain. Pt was last seen by Dr. Denyse Amass on 06/02/20 and was OK to RTW w/ a 20lb lifting weight restriction. He works at [employer] in [employer] [place]  Today, pt reports LBP returning x 3 days, MOI unknown. Pt locates pain to left side lower back radiating into the left leg but not beyond the knee. Chronic B LE n/t. Short term relief with treatment modalities listed below. Notes night disturbance last night, tried sleeping with a pillow between the knees.   Radiating pain: yes LE numbness/tingling:  LE weakness: no Aggravates: twisting, bending, lifting Treatments tried: Tylenol, ice, stretching, hot shower  Dx imaging: 05/03/20 L-spine w/ bend XR  Pertinent review of systems: No fevers or chills  Relevant historical information: Significant spondylolisthesis L-spine.   Exam:  BP 122/88   Pulse 89   Ht 5\' 8"  (1.727 m)   Wt 181 lb (82.1 kg)   SpO2 98%   BMI 27.52 kg/m  General: Well Developed, well nourished, and in no acute distress.   MSK: L-spine: Normal appearing Nontender palpation spinal midline. Nontender paraspinal musculature. Decreased lumbar motion to extension.  Otherwise range of motion is intact. Lower extremity strength is intact.  Reflexes are intact.    Lab and Radiology Results  X-ray images lumbar spine obtained today personally and independently interpreted Severe spondylolisthesis L5-S1.  Appears stable compared to prior x-rays 2021.  No acute fractures are visible. Await formal radiology review    Assessment and Plan: 62 y.o. male with acute exacerbation of chronic back pain.  He was seen for back pain about 3 years ago and did quite well with physical therapy.  He is having today with sounds like an acute exacerbation of pain without much severe  radicular symptoms or weakness.  Plan for retrial of physical therapy heating pad TENS unit.  Also will refill prednisone and use tizanidine as needed.  If he is developing neurologic symptoms we should proceed to MRI quickly but neurologically is intact distally least today. Recheck in about 6 weeks.   PDMP not reviewed this encounter. Orders Placed This Encounter  Procedures   DG Lumbar Spine 2-3 Views    Standing Status:   Future    Number of Occurrences:   1    Standing Expiration Date:   04/14/2024    Order Specific Question:   Reason for Exam (SYMPTOM  OR DIAGNOSIS REQUIRED)    Answer:   eval low back pain    Order Specific Question:   Preferred imaging location?    Answer:   [facility]   Ambulatory referral to Physical Therapy    Referral Priority:   Routine    Referral Type:   Physical Medicine    Referral Reason:   Specialty Services Required    Requested Specialty:   Physical Therapy   Meds ordered this encounter  Medications   predniSONE (DELTASONE) 50 MG tablet    Sig: Take 1 tablet (50 mg total) by mouth daily.    Dispense:  5 tablet    Refill:  0   tiZANidine (ZANAFLEX) 4 MG tablet    Sig: Take 1 tablet (4 mg total) by mouth every 8 (eight) hours as needed.    Dispense:  30 tablet    Refill:  1  Discussed warning signs or symptoms. Please see discharge instructions. Patient expresses understanding.   The above documentation has been reviewed and is accurate and complete Clementeen Graham, M.D.

## 2023-04-15 NOTE — Patient Instructions (Addendum)
Thank you for coming in today.   Please get an Xray today before you leave   I've referred you to Physical Therapy.  Let us know if you don't hear from them in one week.   Take the prednisone for 5 days.   Use the tizinidine as needed for muscle pain.   Recheck in 6 weeks especially if not better.   Come back or go to the emergency room if you notice new weakness new numbness problems walking or bowel or bladder problems.

## 2023-04-16 ENCOUNTER — Telehealth: Payer: Self-pay | Admitting: Family Medicine

## 2023-04-16 NOTE — Telephone Encounter (Signed)
Called and spoke to pt at great lengths. He reported no worsening symptoms. No numbness/tingling. No bladder or bowel dysfunction. He took the 2nd dose of prednisone today. He has not yet contacted PT about scheduling. I advised and re-iterated Dr. Zollie Pee treatment plan; try using a heating pad, TENS unit, contact PT, finish the course of prednisone, and re-advised to please let us know if symptoms are worsening. He seemed confused, but I repeating his treatment plan, and he verbalized understanding.

## 2023-04-16 NOTE — Telephone Encounter (Signed)
Patient taken 2 doses of steroids and 4 muscle relaxers since visit yesterday. Does not feel any improvement and wanted to make an appt for tomorrow.  I advised patient to continue meds before scheduling a f/u. If pt does not improve, at what point should he schedule a followup appt or how much time should be give the meds he started yesterday?

## 2023-04-22 ENCOUNTER — Other Ambulatory Visit: Payer: Self-pay | Admitting: Cardiovascular Disease

## 2023-04-22 NOTE — Telephone Encounter (Signed)
Refill request

## 2023-04-23 NOTE — Progress Notes (Signed)
Lumbar spine x-ray shows significant spondylolisthesis rated grade 3.  You do have arthritis present in your back.  Have you scheduled physical therapy yet?  If worsening consider MRI for surgery planning or injection planning.

## 2023-05-01 ENCOUNTER — Other Ambulatory Visit: Payer: Self-pay | Admitting: Family Medicine

## 2023-05-01 NOTE — Telephone Encounter (Signed)
Last OV 04/15/23 Next OV 06/03/23  Last refill 04/15/23 Qty #30/1

## 2023-05-18 ENCOUNTER — Other Ambulatory Visit: Payer: Self-pay | Admitting: Interventional Cardiology

## 2023-05-31 NOTE — Progress Notes (Unsigned)
Rubin Payor, PhD, LAT, ATC acting as a scribe for Clementeen Graham, MD.  Martin Archer is a 62 y.o. male who presents to [facility] at [facility] today for f/u low back pain. Pt was last seen by Dr. Denyse Amass on 04/15/23 and was advised to use a heating pad, TENS unit, and was prescribed tizanidine, and prednisone. He was also referred to PT, but never scheduled any visits.  Today, pt reports low back pain continues. He has been doing some stretches on his own, heat, ice. He didn't feel like he needed to schedule any PT. Pain had improved and then he moved and was doing a lot of heavy lifting. Pain seems to be worse in the mornings and when sitting down. The other day at work, possibly Thursday, he did a full golf swing, which really seemed to exacerbate his LBP. Pt locates pain to both sides of his low back. No radiating pain.   Dx imaging: 04/15/23 L-spine XR 05/03/20 L-spine w/ bend XR   Pertinent review of systems: No fevers or chills  Relevant historical information: Bilateral pars defects with spondylolisthesis L5-S1. Peripheral arterial disease.  Exam:  BP 132/86   Pulse 80   Ht 5\' 8"  (1.727 m)   Wt 186 lb (84.4 kg)   SpO2 98%   BMI 28.28 kg/m  General: Well Developed, well nourished, and in no acute distress.   MSK: L-spine normal appearing. Nontender palpation spinal midline. Decreased lumbar motion pain with extension. Lower extremity strength is intact except for right great toe dorsiflexion which is reduced 4/5. Sensation is intact. Reflexes are intact.    Lab and Radiology Results  EXAM: LUMBAR SPINE - 2-3 VIEW   COMPARISON:  05/03/2020.   FINDINGS: No acute fracture.  No bone lesion.   Grade 3 anterolisthesis of L5 on S1 due to bilateral pars defects, without convincing change from the previous radiographs. No other spondylolisthesis.   Moderate loss of disc height at L5-S1. Mild loss of disc height at L1-L2. Minor loss of disc  height at L3-L4. These findings also stable.   Scattered aortoiliac atherosclerotic calcifications.   IMPRESSION: 1. No acute findings.  Stable appearance from the prior radiographs. 2. Grade 3 anterolisthesis due to chronic bilateral pars defects at L5-S1. 3. Degenerative changes as detailed.     Electronically Signed   By: Amie Portland M.D.   On: 04/22/2023 11:27 I, Clementeen Graham, personally (independently) visualized and performed the interpretation of the images attached in this note.    Assessment and Plan: 62 y.o. male with continued chronic low back pain with right lumbar radiculopathy.  Today's pain is either an extension of chronic pain or an exacerbation of chronic pain.  He does have some weakness at the L5 nerve root pattern on the right with weakness to the right great toe dorsiflexion.  He does have significant spondylolisthesis grade 3 at L5-S1 which would correspond at this level. We discussed options.  He is already had a good trial of conservative management with home exercise program.  Plan for MRI for potential injection planning.  Recheck after MRI.  Consider epidural steroid injections or nerve root block or potentially even facet injections.   PDMP not reviewed this encounter. Orders Placed This Encounter  Procedures   MR Lumbar Spine Wo Contrast    Standing Status:   Future    Standing Expiration Date:   06/02/2024    Order Specific Question:   What is the patient's sedation  requirement?    Answer:   No Sedation    Order Specific Question:   Does the patient have a pacemaker or implanted devices?    Answer:   No    Order Specific Question:   Preferred imaging location?    Answer:   [facility] (table limit-550lbs)   No orders of the defined types were placed in this encounter.    Discussed warning signs or symptoms. Please see discharge instructions. Patient expresses understanding.   The above documentation has been reviewed and is accurate and  complete Clementeen Graham, M.D.

## 2023-06-03 ENCOUNTER — Ambulatory Visit (INDEPENDENT_AMBULATORY_CARE_PROVIDER_SITE_OTHER): Payer: 59 | Admitting: Family Medicine

## 2023-06-03 ENCOUNTER — Other Ambulatory Visit: Payer: Self-pay | Admitting: Interventional Cardiology

## 2023-06-03 VITALS — BP 132/86 | HR 80 | Ht 68.0 in | Wt 186.0 lb

## 2023-06-03 DIAGNOSIS — M5442 Lumbago with sciatica, left side: Secondary | ICD-10-CM | POA: Diagnosis not present

## 2023-06-03 DIAGNOSIS — G8929 Other chronic pain: Secondary | ICD-10-CM | POA: Diagnosis not present

## 2023-06-03 DIAGNOSIS — R29898 Other symptoms and signs involving the musculoskeletal system: Secondary | ICD-10-CM

## 2023-06-03 DIAGNOSIS — M4300 Spondylolysis, site unspecified: Secondary | ICD-10-CM | POA: Diagnosis not present

## 2023-06-03 NOTE — Patient Instructions (Addendum)
Thank you for coming in today.   You should hear from MRI scheduling within 1 week. If you do not hear please let me know.    Recheck once we get the results back from the MRI to plan injections.   I can re-prescribe prednisone if you need me to.

## 2023-06-04 ENCOUNTER — Telehealth: Payer: Self-pay | Admitting: Family Medicine

## 2023-06-04 NOTE — Telephone Encounter (Signed)
Forwarding to Dr. Corey to review and advise.  

## 2023-06-04 NOTE — Telephone Encounter (Signed)
Pt scheduled first avail MRI on 10/30. States Dr. Denyse Amass discussed calling him in something for pain to get him through. [facility] on file.

## 2023-06-05 ENCOUNTER — Other Ambulatory Visit: Payer: Self-pay | Admitting: Interventional Cardiology

## 2023-06-06 MED ORDER — OXYCODONE-ACETAMINOPHEN 5-325 MG PO TABS
1.0000 | ORAL_TABLET | Freq: Three times a day (TID) | ORAL | 0 refills | Status: DC | PRN
Start: 2023-06-06 — End: 2023-07-29

## 2023-06-06 MED ORDER — PREDNISONE 50 MG PO TABS: 50.0000 mg | ORAL_TABLET | Freq: Every day | ORAL | 0 refills | Status: DC

## 2023-06-06 MED ORDER — GABAPENTIN 300 MG PO CAPS: 300.0000 mg | ORAL_CAPSULE | Freq: Three times a day (TID) | ORAL | 3 refills | Status: DC | PRN

## 2023-06-06 NOTE — Addendum Note (Signed)
Addended by: Rodolph Bong on: 06/06/2023 06:29 AM   Modules accepted: Orders

## 2023-06-06 NOTE — Telephone Encounter (Signed)
I refilled prednisone.  I also prescribed higher doses of gabapentin which can help with nerve pain.  Lastly I prescribed oxycodone which is a strong opiate based pain medicine.

## 2023-06-26 ENCOUNTER — Ambulatory Visit
Admission: RE | Admit: 2023-06-26 | Discharge: 2023-06-26 | Disposition: A | Discharge status: Home / Self Care | Payer: 59 | Source: Ambulatory Visit | Attending: Family Medicine | Admitting: Family Medicine

## 2023-06-26 DIAGNOSIS — M4300 Spondylolysis, site unspecified: Secondary | ICD-10-CM

## 2023-06-26 DIAGNOSIS — G8929 Other chronic pain: Secondary | ICD-10-CM

## 2023-07-05 ENCOUNTER — Telehealth: Payer: Self-pay | Admitting: Family Medicine

## 2023-07-05 DIAGNOSIS — M4300 Spondylolysis, site unspecified: Secondary | ICD-10-CM

## 2023-07-05 DIAGNOSIS — G8929 Other chronic pain: Secondary | ICD-10-CM

## 2023-07-05 NOTE — Telephone Encounter (Signed)
Epidural steroid injection ordered 

## 2023-07-05 NOTE — Progress Notes (Signed)
MRI shows areas of pinched nerve that could cause back pain and leg pain.  I have ordered an epidural steroid injection that should help.  You should hear from [facility] imaging soon about scheduling this injection.  I recommend we return to talk about the results of this MRI and the injection after the injection in the near future.  Schedule an appointment with me a week or 2 after you get your back injection.

## 2023-07-09 ENCOUNTER — Telehealth: Payer: Self-pay | Admitting: *Deleted

## 2023-07-09 NOTE — Telephone Encounter (Signed)
Left message to call back to schedule a tele pre op appt.  ?

## 2023-07-09 NOTE — Telephone Encounter (Signed)
   Name: Martin Archer  DOB: 1961/05/07  MRN: [id number]  Primary Cardiologist: Lance Muss, MD   Preoperative team, please contact this patient and set up a phone call appointment for further preoperative risk assessment. Please obtain consent and complete medication review. Thank you for your help. Last seen by Robin Searing, NP on 02/12/2023  I confirm that guidance regarding antiplatelet and oral anticoagulation therapy has been completed and, if necessary, noted below.  Per office protocol, if patient is without any new symptoms or concerns at the time of their virtual visit, he/she may hold Plavix for 5 days prior to procedure. Please resume Plavix as soon as possible postprocedure, at the discretion of the surgeon.    I also confirmed the patient resides in the state of [place]. As per [facility] Medical Board telemedicine laws, the patient must reside in the state in which the provider is licensed.   Joni Reining, NP 07/09/2023, 4:17 PM [facility]

## 2023-07-09 NOTE — Telephone Encounter (Signed)
   Pre-operative Risk Assessment    Patient Name: Martin Archer  DOB: 1960-10-01 MRN: [id number]  LAST OV: Alden Server Dick,NP 02/12/2024 Upcoming OV: Dr. Thomasene Lot 09/02/2023     Request for Surgical Clearance    Procedure:   Lumbar Epidural  Date of Surgery:  Clearance TBD                                 Surgeon:  Not Indicated Surgeon's Group or Practice Name:  [facility] Phone number:  [phone] Fax number:  [phone]   Type of Clearance Requested:   - Medical  - Pharmacy:  Hold Aspirin and Clopidogrel (Plavix) 5 days.   Type of Anesthesia:  Not Indicated   Additional requests/questions:    Signed, Mackey Bolon   07/09/2023, 3:38 PM

## 2023-07-10 ENCOUNTER — Other Ambulatory Visit: Payer: Self-pay | Admitting: Interventional Cardiology

## 2023-07-10 ENCOUNTER — Telehealth: Payer: Self-pay | Admitting: *Deleted

## 2023-07-10 NOTE — Telephone Encounter (Signed)
Pt called back and he has been scheduled for tele pre op appt 07/30/23. Med rec and consent are done.

## 2023-07-10 NOTE — Telephone Encounter (Signed)
Pt called back and he has been scheduled for tele pre op appt 07/30/23. Med rec and consent are done.      Patient Consent for Virtual Visit        Martin Archer has provided verbal consent on 07/10/2023 for a virtual visit (video or telephone).   CONSENT FOR VIRTUAL VISIT FOR:  Martin Archer  By participating in this virtual visit I agree to the following:  I hereby voluntarily request, consent and authorize [facility] and its employed or contracted physicians, physician assistants, nurse practitioners or other licensed health care professionals (the Practitioner), to provide me with telemedicine health care services (the "Services") as deemed necessary by the treating Practitioner. I acknowledge and consent to receive the Services by the Practitioner via telemedicine. I understand that the telemedicine visit will involve communicating with the Practitioner through live audiovisual communication technology and the disclosure of certain medical information by electronic transmission. I acknowledge that I have been given the opportunity to request an in-person assessment or other available alternative prior to the telemedicine visit and am voluntarily participating in the telemedicine visit.  I understand that I have the right to withhold or withdraw my consent to the use of telemedicine in the course of my care at any time, without affecting my right to future care or treatment, and that the Practitioner or I may terminate the telemedicine visit at any time. I understand that I have the right to inspect all information obtained and/or recorded in the course of the telemedicine visit and may receive copies of available information for a reasonable fee.  I understand that some of the potential risks of receiving the Services via telemedicine include:  Delay or interruption in medical evaluation due to technological equipment failure or disruption; Information transmitted may  not be sufficient (e.g. poor resolution of images) to allow for appropriate medical decision making by the Practitioner; and/or  In rare instances, security protocols could fail, causing a breach of personal health information.  Furthermore, I acknowledge that it is my responsibility to provide information about my medical history, conditions and care that is complete and accurate to the best of my ability. I acknowledge that Practitioner's advice, recommendations, and/or decision may be based on factors not within their control, such as incomplete or inaccurate data provided by me or distortions of diagnostic images or specimens that may result from electronic transmissions. I understand that the practice of medicine is not an exact science and that Practitioner makes no warranties or guarantees regarding treatment outcomes. I acknowledge that a copy of this consent can be made available to me via my patient portal [facility] MyChart), or I can request a printed copy by calling the office of [facility].    I understand that my insurance will be billed for this visit.   I have read or had this consent read to me. I understand the contents of this consent, which adequately explains the benefits and risks of the Services being provided via telemedicine.  I have been provided ample opportunity to ask questions regarding this consent and the Services and have had my questions answered to my satisfaction. I give my informed consent for the services to be provided through the use of telemedicine in my medical care

## 2023-07-10 NOTE — Telephone Encounter (Signed)
I returned pt's call from this morning and left message to call back to schedule tele pre op appt.

## 2023-07-17 ENCOUNTER — Telehealth: Payer: Self-pay | Admitting: Cardiology

## 2023-07-17 NOTE — Telephone Encounter (Signed)
Pt returning call regarding location of Carotid to be done. Pt states that he would like to have test done at [facility]. Please advise

## 2023-07-17 NOTE — Telephone Encounter (Signed)
 Attempted to call patient, no answer left message requesting a call back.

## 2023-07-18 NOTE — Telephone Encounter (Signed)
Patient is returning phone call.  °

## 2023-07-18 NOTE — Telephone Encounter (Signed)
 Attempted to call patient x2, no answer left message requesting a call back.

## 2023-07-19 NOTE — Telephone Encounter (Signed)
Carotids completed at [facility]

## 2023-07-24 ENCOUNTER — Ambulatory Visit (HOSPITAL_COMMUNITY)
Admission: RE | Admit: 2023-07-24 | Discharge: 2023-07-24 | Disposition: A | Discharge status: Home / Self Care | Payer: 59 | Source: Ambulatory Visit | Attending: Cardiology | Admitting: Cardiology

## 2023-07-24 ENCOUNTER — Other Ambulatory Visit (HOSPITAL_COMMUNITY): Payer: Self-pay | Admitting: *Deleted

## 2023-07-24 DIAGNOSIS — I6523 Occlusion and stenosis of bilateral carotid arteries: Secondary | ICD-10-CM | POA: Insufficient documentation

## 2023-07-29 ENCOUNTER — Other Ambulatory Visit: Payer: Self-pay | Admitting: Family Medicine

## 2023-07-29 MED ORDER — OXYCODONE-ACETAMINOPHEN 5-325 MG PO TABS: 1.0000 | ORAL_TABLET | Freq: Three times a day (TID) | ORAL | 0 refills | Status: DC | PRN

## 2023-07-29 NOTE — Telephone Encounter (Signed)
Last OV 06/03/23 Next OV not scheduled  Last refill 06/06/23 Qty #15/0

## 2023-07-29 NOTE — Telephone Encounter (Signed)
Patient called asking if Dr Denyse Amass would be able to refill his oxyCODONE-acetaminophen (PERCOCET/ROXICET) 5-325 MG tablet.  He said that he has an appointment with cardiology to discuss coming off of the blood thinner before he can schedule an injection but is in a lot of pain.   Please advise.

## 2023-07-30 ENCOUNTER — Encounter: Payer: Self-pay | Admitting: Nurse Practitioner

## 2023-07-30 ENCOUNTER — Ambulatory Visit: Payer: 59 | Attending: Nurse Practitioner | Admitting: Nurse Practitioner

## 2023-07-30 DIAGNOSIS — Z01810 Encounter for preprocedural cardiovascular examination: Secondary | ICD-10-CM | POA: Diagnosis not present

## 2023-07-30 MED ORDER — OXYCODONE-ACETAMINOPHEN 5-325 MG PO TABS: 1.0000 | ORAL_TABLET | Freq: Three times a day (TID) | ORAL | 0 refills | Status: DC | PRN

## 2023-07-30 NOTE — Telephone Encounter (Signed)
Oxycodone sent in

## 2023-07-30 NOTE — Progress Notes (Signed)
Virtual Visit via Telephone Note   Because of Martin Archer's co-morbid illnesses, he is at least at moderate risk for complications without adequate follow up.  This format is felt to be most appropriate for this patient at this time.  The patient did not have access to video technology/had technical difficulties with video requiring transitioning to audio format only (telephone).  All issues noted in this document were discussed and addressed.  No physical exam could be performed with this format.  Please refer to the patient's chart for his consent to telehealth for [facility].  Evaluation Performed:  Preoperative cardiovascular risk assessment _____________   Date:  07/30/2023   Patient ID:  Martin Archer, DOB [birth year], MRN [id number] Patient Location:  Home Provider location:   Office  Primary Care Provider:  Corwin Levins, MD Primary Cardiologist:  Lance Muss, MD  Chief Complaint / Patient Profile   62 y.o. y/o male with a h/o CAD with CTO of RCA and mild nonobstructive disease elsewhere, hyperlipidemia, substance abuse, hypertension, bilateral carotid endarterectomy with redo 2022, sleep apnea, chronic HFpEF, s/p stent to left subclavian who is pending lumbar epidural with [facility] on date TBD and presents today for telephonic preoperative cardiovascular risk assessment.  History of Present Illness    Martin Archer is a 62 y.o. male who presents via audio/video conferencing for a telehealth visit today.  Pt was last seen in cardiology clinic on 02/12/23 by Robin Searing, NP.  At that time Martin Archer was doing well.  The patient is now pending procedure as outlined above. Since his last visit, he denies chest pain, shortness of breath, lower extremity edema, fatigue, palpitations, melena, hematuria, hemoptysis, diaphoresis, weakness, presyncope, syncope, orthopnea, and PND. He remains active with walking and playing golf for  exercise as well as lifting light weights and is not having any concerning cardiac symptoms.    Past Medical History    Past Medical History:  Diagnosis Date   Anemia, unspecified    Arthritis    Diverticulosis of colon (without mention of hemorrhage)    Gastric ulcer with hemorrhage and obstruction 2011   Dr [facility]   GERD (gastroesophageal reflux disease)    hx   Hyperlipemia    Hypertension    Personal history of colonic polyps 01/30/2010   hyperplastic rectal   Sleep apnea    on CPAP; [facility]   Stroke [facility])    mini strokes-bilateral carotid endarterectomy   Substance abuse (HCC)    History of alcohol abuse   Past Surgical History:  Procedure Laterality Date   AORTIC ARCH ANGIOGRAPHY N/A 04/26/2021   Procedure: AORTIC ARCH ANGIOGRAPHY;  Surgeon: Iran Ouch, MD;  [facility] LAB;  Service: Cardiovascular;  Laterality: N/A;   CARDIAC CATHETERIZATION  01/25/2021   CAROTID ENDARTERECTOMY  11/2006   left Dr Jerilee Field   COLONOSCOPY  2011   DP-positive FOBT/IDA   ENDARTERECTOMY Right 01/19/2015   Procedure: RIGHT CAROTID ENDARTERECTOMY WITH PATCH ANGIOPLASTY;  Surgeon: Pryor Ochoa, MD;  Location: [facility] OR;  Service: Vascular;  Laterality: Right;   ENDARTERECTOMY Right 06/27/2021   Procedure: RIGHT REDO CAROTID ARTERY ENDARTERECTOMY & PLACEMENT OF 15Fr DRAIN;  Surgeon: Chuck Hint, MD;  Location: [facility] OR;  Service: Vascular;  Laterality: Right;   ESOPHAGOGASTRODUODENOSCOPY Left 06/17/2013   Procedure: ESOPHAGOGASTRODUODENOSCOPY (EGD);  Surgeon: Willis Modena, MD;  Location: [facility] ENDOSCOPY;  Service: Endoscopy;  Laterality: Left;   ESOPHAGOGASTRODUODENOSCOPY N/A  09/20/2015   Procedure: ESOPHAGOGASTRODUODENOSCOPY (EGD);  Surgeon: Meryl Dare, MD;  Location: [facility] ENDOSCOPY;  Service: Endoscopy;  Laterality: N/A;   FOOT SURGERY Bilateral 2020   Taylor's foot   INGUINAL HERNIA REPAIR  1963   NASAL FRACTURE SURGERY  1974    PATCH ANGIOPLASTY Right 06/27/2021   Procedure: PATCH ANGIOPLASTY USING Livia Snellen BIOLOGIC PATCH;  Surgeon: Chuck Hint, MD;  Location: [facility] OR;  Service: Vascular;  Laterality: Right;   PERIPHERAL VASCULAR INTERVENTION  04/26/2021   Procedure: PERIPHERAL VASCULAR INTERVENTION;  Surgeon: Iran Ouch, MD;  [facility];  Service: Cardiovascular;;   RIGHT/LEFT HEART CATH AND CORONARY ANGIOGRAPHY N/A 01/25/2021   Procedure: RIGHT/LEFT HEART CATH AND CORONARY ANGIOGRAPHY;  Surgeon: Corky Crafts, MD;  Location: [facility] INVASIVE CV LAB;  Service: Cardiovascular;  Laterality: N/A;   TEE WITHOUT CARDIOVERSION N/A 01/17/2015   Procedure: TRANSESOPHAGEAL ECHOCARDIOGRAM (TEE);  Surgeon: Thurmon Fair, MD;  Location: [facility] ENDOSCOPY;  Service: Cardiovascular;  Laterality: N/A;   WISDOM TOOTH EXTRACTION      Allergies  Allergies  Allergen Reactions   Adderall [Amphetamine-Dextroamphet Er] Diarrhea   Prozac [Fluoxetine Hcl] Diarrhea   Sertraline Hcl Diarrhea   Advil [Ibuprofen] Diarrhea    Home Medications    Prior to Admission medications   Medication Sig Start Date End Date Taking? Authorizing Provider  acetaminophen (TYLENOL) 500 MG tablet Take 500 mg by mouth every 6 (six) hours as needed.    [provider]  amLODipine (NORVASC) 10 MG tablet Take 10 mg by mouth daily. 03/28/22   [provider]  aspirin EC 81 MG tablet Take 1 tablet (81 mg total) by mouth daily. Swallow whole. 04/11/21   Iran Ouch, MD  atorvastatin (LIPITOR) 80 MG tablet TAKE 1 TABLET BY MOUTH DAILY 06/03/23   [place], Peter M, MD  Carboxymethylcellul-Glycerin (LUBRICATING EYE DROPS OP) Place 1 drop into both eyes daily as needed (dry eyes).    [provider]  carvedilol (COREG) 25 MG tablet TAKE 1 TABLET(25 MG) BY MOUTH TWICE DAILY 02/12/23   Corky Crafts, MD  clopidogrel (PLAVIX) 75 MG tablet TAKE 1 TABLET BY MOUTH DAILY WITH BREAKFAST 04/22/23   Gaston Islam., NP  doxylamine, Sleep, (UNISOM) 25 MG tablet Take 25 mg by mouth at bedtime as needed for sleep.    [provider]  Dulaglutide (TRULICITY) 0.75 MG/0.5ML SOPN Inject 0.75 mg into the skin once a week. 12/06/22   Corwin Levins, MD  ENTRESTO 97-103 MG TAKE 1 TABLET BY MOUTH TWICE DAILY 06/06/23   Gaston Islam., NP  ezetimibe (ZETIA) 10 MG tablet TAKE 1 TABLET(10 MG) BY MOUTH DAILY 07/10/23   Corky Crafts, MD  fluorouracil (EFUDEX) 5 % cream Apply topically 2 (two) times daily. 09/10/22   [provider]  furosemide (LASIX) 20 MG tablet TAKE 1 TABLET(20 MG) BY MOUTH DAILY AS NEEDED FOR LOWER EXTREMITY SWELLING 05/20/23   Corky Crafts, MD  gabapentin (NEURONTIN) 300 MG capsule Take 1 capsule (300 mg total) by mouth 3 (three) times daily as needed. 06/06/23   Rodolph Bong, MD  JARDIANCE 10 MG TABS tablet TAKE 1 TABLET(10 MG) BY MOUTH DAILY BEFORE BREAKFAST 06/03/23   [place], Peter M, MD  Multiple Vitamin (MULTIVITAMIN WITH MINERALS) TABS tablet Take 1 tablet by mouth daily.    [provider]  oxyCODONE-acetaminophen (PERCOCET/ROXICET) 5-325 MG tablet Take 1 tablet by mouth every 8 (eight) hours as needed for severe  pain (pain score 7-10). 07/29/23   Rodolph Bong, MD  predniSONE (DELTASONE) 20 MG tablet Take by mouth. 01/25/23   [provider]  predniSONE (DELTASONE) 50 MG tablet Take 1 tablet (50 mg total) by mouth daily. 06/06/23   Rodolph Bong, MD  sildenafil (VIAGRA) 100 MG tablet Take 0.5-1 tablets (50-100 mg total) by mouth daily as needed for erectile dysfunction. 10/01/22   Corwin Levins, MD  tirzepatide [facility]) 5 MG/0.5ML Pen Inject 5 mg into the skin once a week. 10/25/22   Corwin Levins, MD  triamcinolone cream (KENALOG) 0.1 % Apply topically 2 (two) times daily as needed. 01/25/23   [provider]    Physical Exam    Vital Signs:  Martin Archer does not have vital signs available for review today.  Given  telephonic nature of communication, physical exam is limited. AAOx3. NAD. Normal affect.  Speech and respirations are unlabored.  Accessory Clinical Findings    None  Assessment & Plan    1.  Preoperative Cardiovascular Risk Assessment: According to the Revised Cardiac Risk Index (RCRI), his Perioperative Risk of Major Cardiac Event is (%): 11. His Functional Capacity in METs is: 8.42 according to the Duke Activity Status Index (DASI). The patient is doing well from a cardiac perspective. Therefore, based on ACC/AHA guidelines, the patient would be at acceptable risk for the planned procedure without further cardiovascular testing.   The patient was advised that if he develops new symptoms prior to surgery to contact our office to arrange for a follow-up visit, and he verbalized understanding.  Per office protocol, he may hold Plavix for 5 days prior to procedure and should resume as soon as hemodynamically stable postoperatively. Ideally aspirin should be continued without interruption, however if the bleeding risk is too great, aspirin may be held for 5-7 days prior to surgery. Please resume aspirin post operatively when it is felt to be safe from a bleeding standpoint.   A copy of this note will be routed to requesting surgeon.  Time:   Today, I have spent 10 minutes with the patient with telehealth technology discussing medical history, symptoms, and management plan.    Levi Aland, NP-C  07/30/2023, 9:14 AM [address] Office [phone] Fax [phone]

## 2023-08-06 NOTE — Discharge Instructions (Signed)

## 2023-08-07 ENCOUNTER — Ambulatory Visit
Admission: RE | Admit: 2023-08-07 | Discharge: 2023-08-07 | Disposition: A | Discharge status: Home / Self Care | Payer: 59 | Source: Ambulatory Visit | Attending: Family Medicine | Admitting: Family Medicine

## 2023-08-07 DIAGNOSIS — M4300 Spondylolysis, site unspecified: Secondary | ICD-10-CM

## 2023-08-07 DIAGNOSIS — G8929 Other chronic pain: Secondary | ICD-10-CM

## 2023-08-07 MED ORDER — IOPAMIDOL (ISOVUE-M 200) INJECTION 41%
1.0000 mL | Freq: Once | INTRAMUSCULAR | Status: AC
  Administered 2023-08-07: 1 mL via EPIDURAL

## 2023-08-07 MED ORDER — METHYLPREDNISOLONE ACETATE 40 MG/ML INJ SUSP (RADIOLOG
80.0000 mg | Freq: Once | INTRAMUSCULAR | Status: AC
  Administered 2023-08-07: 80 mg via EPIDURAL

## 2023-08-09 LAB — LAB REPORT - SCANNED
Albumin, Urine POC: 46.7
Creatinine, POC: 95.5 mg/dL
Microalb Creat Ratio: 49

## 2023-08-26 NOTE — Progress Notes (Deleted)
 Cardiology Office Note:    Date:  08/26/2023   ID:  Martin Archer, DOB 1960-09-19, MRN [id number]  PCP:  Martin Martin ORN, Archer   [facility] Providers Cardiologist:  Martin Reek, Archer { Click to update primary Archer,subspecialty Archer or APP then REFRESH:1}    Referring Archer: Martin Martin ORN, Archer   No chief complaint on file. ***  History of Present Illness:    Martin Archer is a 62 y.o. male seen for follow up CAD and CHF. He is a former patient of Martin Archer. Also followed by Martin Martin for PAD. He has  a PMH of HTN, tobacco use, HLD, carotid artery disease s/p bilateral endarterectomy with redo 2022, chronic systolic HF- EF 40-45%, TIAs, anemia, CKD, GI bleed, PAD s/p VBX stent placed to the left subclavian artery, CAD s/p [facility] 2022 with CTO of RCA and mild nonobstructive disease in remaining arteries.   Past Medical History:  Diagnosis Date   Anemia, unspecified    Arthritis    Diverticulosis of colon (without mention of hemorrhage)    Gastric ulcer with hemorrhage and obstruction 2011   [facility]   GERD (gastroesophageal reflux disease)    hx   Hyperlipemia    Hypertension    Personal history of colonic polyps 01/30/2010   hyperplastic rectal   Sleep apnea    on CPAP; [facility]   Stroke [facility]    mini strokes-bilateral carotid endarterectomy   Substance abuse (HCC)    History of alcohol  abuse    Past Surgical History:  Procedure Laterality Date   AORTIC ARCH ANGIOGRAPHY N/A 04/26/2021   Procedure: AORTIC ARCH ANGIOGRAPHY;  Surgeon: Martin Archer;  [facility];  Service: Cardiovascular;  Laterality: N/A;   CARDIAC CATHETERIZATION  01/25/2021   CAROTID ENDARTERECTOMY  11/2006   left Martin Archer   COLONOSCOPY  2011   DP-positive FOBT/IDA   ENDARTERECTOMY Right 01/19/2015   Procedure: RIGHT CAROTID ENDARTERECTOMY WITH PATCH ANGIOPLASTY;  Surgeon: Martin JONETTA Collum, Archer;  Location: [facility] OR;  Service:  Vascular;  Laterality: Right;   ENDARTERECTOMY Right 06/27/2021   Procedure: RIGHT REDO CAROTID ARTERY ENDARTERECTOMY & PLACEMENT OF 15Fr DRAIN;  Surgeon: Martin Lonni RAMAN, Archer;  Location: [facility] OR;  Service: Vascular;  Laterality: Right;   ESOPHAGOGASTRODUODENOSCOPY Left 06/17/2013   Procedure: ESOPHAGOGASTRODUODENOSCOPY (EGD);  Surgeon: Martin Cree, Archer;  Location[facility] ENDOSCOPY;  Service: Endoscopy;  Laterality: Left;   ESOPHAGOGASTRODUODENOSCOPY N/A 09/20/2015   Procedure: ESOPHAGOGASTRODUODENOSCOPY (EGD);  Surgeon: Martin ONEIDA Buddy, Archer;  Location: [facility] ENDOSCOPY;  Service: Endoscopy;  Laterality: N/A;   Archer SURGERY Bilateral 2020   Martin Archer   INGUINAL HERNIA REPAIR  1963   NASAL FRACTURE SURGERY  1974   PATCH ANGIOPLASTY Right 06/27/2021   Procedure: PATCH ANGIOPLASTY USING GEORGE BIOLOGIC PATCH;  Surgeon: Martin Lonni RAMAN, Archer;  Location: [facility] OR;  Service: Vascular;  Laterality: Right;   PERIPHERAL VASCULAR INTERVENTION  04/26/2021   Procedure: PERIPHERAL VASCULAR INTERVENTION;  Surgeon: Martin Archer;  [facility] LAB;  Service: Cardiovascular;;   RIGHT/LEFT HEART CATH AND CORONARY ANGIOGRAPHY N/A 01/25/2021   Procedure: RIGHT/LEFT HEART CATH AND CORONARY ANGIOGRAPHY;  Surgeon: Archer Martin RAMAN, Archer;  Location[facility] INVASIVE CV LAB;  Service: Cardiovascular;  Laterality: N/A;   TEE WITHOUT CARDIOVERSION N/A 01/17/2015   Procedure: TRANSESOPHAGEAL ECHOCARDIOGRAM (TEE);  Surgeon: Jerel Balding, Archer;  Location: [facility] ENDOSCOPY;  Service: Cardiovascular;  Laterality: N/A;   WISDOM  TOOTH EXTRACTION      Current Medications: No outpatient medications have been marked as taking for the 09/02/23 encounter (Appointment) with Tameshia Bonneville M, Archer.     Allergies:   Adderall [amphetamine-dextroamphet er], Prozac  [fluoxetine  hcl], Sertraline hcl, and Advil [ibuprofen]   Social History   Socioeconomic History   Marital status: Divorced    Spouse name: Not on file    Number of children: 2   Years of education: Not on file   Highest education level: Not on file  Occupational History   Occupation: self employeed Investment Banker, Corporate: [employer]   Tobacco Use   Smoking status: Former    Current packs/day: 0.00    Types: Cigarettes    Quit date: 06/22/2015    Years since quitting: 8.1   Smokeless tobacco: Never   Tobacco comments:    smoked 1976-2007 & 2009- present , up to 2 ppd, average > 1ppd  Vaping Use   Vaping status: Never Used  Substance and Sexual Activity   Alcohol  use: Not Currently    Alcohol /week: 0.0 standard drinks of alcohol    Drug use: No   Sexual activity: Yes  Other Topics Concern   Not on file  Social History Narrative   Lives alone.  Daughter lives in area.    Social Drivers of Corporate Investment Banker Strain: Low Risk  (02/19/2023)   Overall Financial Resource Strain (CARDIA)    Difficulty of Paying Living Expenses: Not very hard  Food Insecurity: No Food Insecurity (02/19/2023)   Hunger Vital Sign    Worried About Running Out of Food in the Last Year: Never true    Ran Out of Food in the Last Year: Never true  Transportation Needs: No Transportation Needs (02/19/2023)   PRAPARE - Administrator, Civil Service (Medical): No    Lack of Transportation (Non-Medical): No  Physical Activity: Insufficiently Active (04/02/2023)   Exercise Vital Sign    Days of Exercise per Week: 3 days    Minutes of Exercise per Session: 10 min  Stress: Stress Concern Present (04/02/2023)   [employer] of Occupational Health - Occupational Stress Questionnaire    Feeling of Stress : To some extent  Social Connections: Unknown (02/19/2023)   Social Connection and Isolation Panel [NHANES]    Frequency of Communication with Friends and Family: More than three times a week    Frequency of Social Gatherings with Friends and Family: More than three times a week    Attends Religious Services: Not on Energy Manager or Organizations: Yes    Attends Engineer, Structural: More than 4 times per year    Marital Status: Divorced     Family History: The patient's ***family history includes Cancer in his maternal grandfather; Diabetes in his father and paternal grandfather; Heart attack in his mother; Heart attack (age of onset: 55) in his brother; Heart attack (age of onset: 84) in his maternal grandfather; Hypertension in his father; Stroke in his father and paternal uncle. There is no history of Colon cancer, Esophageal cancer, Stomach cancer, Colon polyps, or Rectal cancer.  ROS:   Please see the history of present illness.    *** All other systems reviewed and are negative.  EKGs/Labs/Other Studies Reviewed:    The following studies were reviewed today:  Echo 12/20/20: IMPRESSIONS     1. Left ventricular ejection fraction, by estimation, is 40 to 45%. The  left ventricle has mildly decreased function. The left ventricle  demonstrates global hypokinesis. The left ventricular internal cavity size  was moderately dilated. Left ventricular  diastolic parameters are consistent with Grade III diastolic dysfunction  (restrictive). Elevated left ventricular end-diastolic pressure.   2. Right ventricular systolic function is normal. The right ventricular  size is normal. There is mildly elevated pulmonary artery systolic  pressure. The estimated right ventricular systolic pressure is 40.2 mmHg.   3. Left atrial size was severely dilated.   4. Right atrial size was severely dilated.   5. The mitral valve is normal in structure. Mild mitral valve  regurgitation. No evidence of mitral stenosis.   6. The aortic valve has an indeterminant number of cusps. Aortic valve  regurgitation is not visualized. Mild to moderate aortic valve  sclerosis/calcification is present, without any evidence of aortic  stenosis.   7. Aortic dilatation noted. There is mild dilatation of the aortic root,  measuring  38 mm. There is mild dilatation of the ascending aorta,  measuring 43 mm.   8. The inferior vena cava is normal in size with greater than 50%  respiratory variability, suggesting right atrial pressure of 3 mmHg.   9. Compared to prior echo, LVF has declined.   Cardiac cath 01/25/21: RIGHT/LEFT HEART CATH AND CORONARY ANGIOGRAPHY   Conclusion    Prox RCA lesion is 100% stenosed. Left to right collaterals. 1st Diag lesion is 25% stenosed. 3rd Mrg lesion is 70% stenosed. LV end diastolic pressure is normal. There is no aortic valve stenosis. Hemodynamic findings consistent with mild pulmonary hypertension. Ao sat 94%, PA sat 67%, mean PA pressure 27 mm Hg; mean PCWP 13 mm Hg; CO 4.6 L/min; CI 2.26   LV dysfuction out of proportion to the degree of CAD.   Medical therapy for heart failure.   Diagnostic Dominance: Right        Event monitor 02/25/23: Study Highlights      Normal sinus rhythm with rare PACs and rare PVCs.   Brief runs of PACs.   No symptoms noted.   No atrial fibrillation.     Patch Wear Time:  6 days and 5 hours (2024-06-18T11:12:45-0400 to 2024-06-24T16:59:37-0400)   Patient had a min HR of 64 bpm, max HR of 207 bpm, and avg HR of 97 bpm. Predominant underlying rhythm was Sinus Rhythm. 5 Supraventricular Tachycardia runs occurred, the run with the fastest interval lasting 8 beats with a max rate of 207 bpm, the  longest lasting 8 beats with an avg rate of 140 bpm. Some episodes of Supraventricular Tachycardia may be possible Atrial Tachycardia with variable block. Isolated SVEs were rare (<1.0%), SVE Couplets were rare (<1.0%), and SVE Triplets were rare  (<1.0%). Isolated VEs were rare (<1.0%), and no VE Couplets or VE Triplets were present.   Recent Labs: 10/01/2022: ALT 16; BUN 34; Creatinine, Ser 2.13; Hemoglobin 14.7; Platelets 224.0; Potassium 5.7 No hemolysis seen; Sodium 136 02/12/2023: Magnesium  2.2; TSH 1.700  Recent Lipid Panel    Component Value  Date/Time   CHOL 119 10/01/2022 1105   CHOL 124 04/13/2021 1018   CHOL 286 (H) 10/12/2013 0436   TRIG 178.0 (H) 10/01/2022 1105   TRIG 572 (H) 10/12/2013 0436   TRIG 284 (HH) 07/15/2006 1158   HDL 34.90 (L) 10/01/2022 1105   HDL 35 (L) 04/13/2021 1018   HDL 35 (L) 10/12/2013 0436   CHOLHDL 3 10/01/2022 1105   VLDL 35.6 10/01/2022 1105   LDLCALC 48 10/01/2022 1105  LDLCALC 59 04/13/2021 1018   LDLCALC  03/23/2020 1601     Comment:     . LDL cholesterol not calculated. Triglyceride levels greater than 400 mg/dL invalidate calculated LDL results. . Reference range: <100 . Desirable range <100 mg/dL for primary prevention;   <70 mg/dL for patients with CHD or diabetic patients  with > or = 2 CHD risk factors. SABRA LDL-C is now calculated using the Martin-Hopkins  calculation, which is a validated novel method providing  better accuracy than the Friedewald equation in the  estimation of LDL-C.  Gladis APPLETHWAITE et al. SANDREA. 7986;689(80): 2061-2068  (http://education.[facility].com/faq/FAQ164)    LDLCALC NOT CALC 10/12/2013 0436   LDLDIRECT 48.0 03/28/2022 1629     Risk Assessment/Calculations:   {Does this patient have ATRIAL FIBRILLATION?:[phone]}  No BP recorded.  {Refresh Note OR Click here to enter BP  :1}***         Physical Exam:    VS:  There were no vitals taken for this visit.    Wt Readings from Last 3 Encounters:  06/03/23 186 lb (84.4 kg)  04/15/23 181 lb (82.1 kg)  03/12/23 196 lb 9.6 oz (89.2 kg)     GEN: *** Well nourished, well developed in no acute distress HEENT: Normal NECK: No JVD; No carotid bruits LYMPHATICS: No lymphadenopathy CARDIAC: ***RRR, no murmurs, rubs, gallops RESPIRATORY:  Clear to auscultation without rales, wheezing or rhonchi  ABDOMEN: Soft, non-tender, non-distended MUSCULOSKELETAL:  No edema; No deformity  SKIN: Warm and dry NEUROLOGIC:  Alert and oriented x 3 PSYCHIATRIC:  Normal affect   ASSESSMENT:    No  diagnosis found. PLAN:    In order of problems listed above:  CAD: No angina on medical therapy.  Known CTO of the RCA.  COntinue aggressive secondary prevention.  Chronic systolic heart failure: Appears euvolemic.  He is on aggressive heart failure therapy.  Last creatinine on file is 2.2.  Would not add any further heart failure medicines such as spironolactone that could affect renal function. HTN: Amlodipine  increased recently by renal per his report a few weeks ago.  Has not had morning meds yet this morning.  Check blood pressure readings at home after relaxing for at least 10 minutes.  Let us  know via MyChart regarding home blood pressure readings.   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   PAD: Managed with Martin. Darron.  Hyperlipidemia: LDL 58.  Continue lipid-lowering therapy. CRI: Cr 2.23.  Tight BP controlled.  Left kidney atrophied. Followed with renal.  Avoid NSAIDs.  Stay well hydrated.       {Are you ordering a CV Procedure (e.g. stress test, cath, DCCV, TEE, etc)?   Press F2        :[id number]}    Medication Adjustments/Labs and Tests Ordered: Current medicines are reviewed at length with the patient today.  Concerns regarding medicines are outlined above.  No orders of the defined types were placed in this encounter.  No orders of the defined types were placed in this encounter.   There are no Patient Instructions on file for this visit.   Signed, Margaurite Salido, Archer  08/26/2023 9:22 AM    [facility]

## 2023-08-29 NOTE — Progress Notes (Signed)
 I, Leotis Batter, CMA acting as a scribe for Artist Lloyd, MD.  Martin Archer is a 63 y.o. male who presents to [facility] at [facility] today for f/u LBP w/ MRI review. Pt was last seen by Dr. Lloyd on 06/03/23 and a MRI was ordered. Based on findings ESI was order and performed on 12/11. Oxycodone , prednisone , and gabapentin  were prescribed.   Today, pt reports continued bilateral lower back tightness. Reports overall improvement of sx s/p ESI. Notes sharp pain in the lower leg 2-3 x weekly, occurring at night while in bed. Sx improve after ambulating. Sx tend to occur after being more active throughout the day.   Dx imaging: 06/26/23 L-spine MRI 04/15/23 L-spine XR 05/03/20 L-spine w/ bend XR   Pertinent review of systems: No fevers or chills  Relevant historical information: History of a DVT   Exam:  BP 112/84   Pulse 93   Ht 5' 8 (1.727 m)   Wt 187 lb (84.8 kg)   SpO2 98%   BMI 28.43 kg/m  General: Well Developed, well nourished, and in no acute distress.   MSK: Lumbar spine lower extremity strength is intact. Reflexes are intact.    Lab and Radiology Results  EXAM: MRI LUMBAR SPINE WITHOUT CONTRAST   TECHNIQUE: Multiplanar, multisequence MR imaging of the lumbar spine was performed. No intravenous contrast was administered.   COMPARISON:  Lumbar spine radiographs 04/15/2023. The sagittal T2 weighted images from a remote lumbar MRI 12/14/2010 are available, although the images in the other planes are not.   FINDINGS: Segmentation: Conventional anatomy assumed, with the last open disc space designated L5-S1.Concordant with prior imaging.   Alignment: Chronic grade 2 anterolisthesis at L5-S1 appears slightly progressive from remote MRI, measuring approximate 1.9 cm.   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 No evidence of acute fracture or aggressive  osseous lesion. Mild sacroiliac degenerative changes bilaterally.   Conus medullaris: Extends to the T12-L1 level and appears normal.   Paraspinal and other soft tissues: No significant paraspinal findings. Severe left renal cortical scarring and atrophy.   Disc levels:   Sagittal images through the lower thoracic spine demonstrate loss of disc height with a broad-based disc protrusion at T10-11. No resulting cord deformity or significant foraminal narrowing. No significant disc space findings at T11-12 or T12-L1.   L1-2: Mild loss of disc height with mild disc bulging and mild bilateral facet hypertrophy. No spinal stenosis or nerve root encroachment.   L2-3: Preserved disc height with mild disc bulging and mild bilateral facet hypertrophy. No significant spinal stenosis or nerve root encroachment.   L3-4: Mild loss of disc height with mild disc bulging and mild to moderate facet and ligamentous hypertrophy. No significant spinal stenosis or nerve root encroachment.   L4-5: Preserved disc height with mild disc bulging. Mild to moderate bilateral facet hypertrophy. No significant spinal stenosis or nerve root encroachment.   L5-S1: As above, chronic bilateral L5 pars defects with progressive anterolisthesis and disc uncovering compared with remote MRI. There is probable interbody ankylosis at this level. There is mild central canal stenosis and mild lateral recess narrowing bilaterally. Chronic severe foraminal narrowing bilaterally with probable chronic bilateral L5 nerve root encroachment.   IMPRESSION: 1. 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 There is chronic severe foraminal narrowing bilaterally and probable chronic bilateral L5 nerve root encroachment. 2. Mild spondylosis at  the other lumbar levels without significant spinal stenosis or nerve root encroachment. 3. No acute osseous findings.      Electronically Signed   By: Elsie Perone M.D.   On: 07/04/2023 10:12 I, Artist Lloyd, personally (independently) visualized and performed the interpretation of the images attached in this note. FLUOROSCOPY: Radiation Exposure Index (as provided by the fluoroscopic device): Dose area product 22.53 uGy*m2   PROCEDURE: The procedure, risks, benefits, and alternatives were explained to the patient. Questions regarding the procedure were encouraged and answered. The patient understands and consents to the procedure.   LUMBAR EPIDURAL INJECTION:   An interlaminar approach was performed on left at L5-S1. The overlying skin was cleansed and anesthetized. A 20 gauge epidural needle was advanced using loss-of-resistance technique.   DIAGNOSTIC EPIDURAL INJECTION:   Injection of Isovue -M 200 shows a good epidural pattern with spread above and below the level of needle placement, primarily on the left no vascular opacification is seen.   THERAPEUTIC EPIDURAL INJECTION:   80 mg of Depo-Medrol  mixed with 1.5 mL 1% lidocaine  were instilled. The procedure was well-tolerated, and the patient was discharged thirty minutes following the injection in good condition.   COMPLICATIONS: None   IMPRESSION: Technically successful epidural injection on the left L5-S1 # 1     Electronically Signed   By: Lonni Necessary M.D.   On: 08/07/2023 14:23     Assessment and Plan: 63 y.o. male with bilateral lumbar radiculopathy secondary to significant pars defects impacting the L5 nerve roots.  He had good results with epidural steroid injection occurring about a month ago.  Plan to add formal physical therapy to focus on core strength.  Check back as needed.  Can repeat injection if needed in the future.   PDMP not reviewed this encounter. Orders Placed This Encounter  Procedures   Ambulatory referral to Physical Therapy    Referral Priority:   Routine    Referral Type:   Physical Medicine     Referral Reason:   Specialty Services Required    Requested Specialty:   Physical Therapy    Number of Visits Requested:   1   No orders of the defined types were placed in this encounter.    Discussed warning signs or symptoms. Please see discharge instructions. Patient expresses understanding.   The above documentation has been reviewed and is accurate and complete Artist Lloyd, M.D.

## 2023-08-30 ENCOUNTER — Ambulatory Visit (INDEPENDENT_AMBULATORY_CARE_PROVIDER_SITE_OTHER): Payer: 59 | Admitting: Family Medicine

## 2023-08-30 ENCOUNTER — Encounter: Payer: Self-pay | Admitting: Family Medicine

## 2023-08-30 VITALS — BP 112/84 | HR 93 | Ht 68.0 in | Wt 187.0 lb

## 2023-08-30 DIAGNOSIS — G8929 Other chronic pain: Secondary | ICD-10-CM

## 2023-08-30 DIAGNOSIS — M4300 Spondylolysis, site unspecified: Secondary | ICD-10-CM

## 2023-08-30 DIAGNOSIS — M5442 Lumbago with sciatica, left side: Secondary | ICD-10-CM | POA: Diagnosis not present

## 2023-08-30 DIAGNOSIS — M5441 Lumbago with sciatica, right side: Secondary | ICD-10-CM

## 2023-08-30 NOTE — Patient Instructions (Addendum)
 Thank you for coming in today.  I've referred you to Physical Therapy.  Let us know if you don't hear from them in one week.    We can repeat that injection when we need to.   Just let me know.

## 2023-09-02 ENCOUNTER — Telehealth: Payer: Self-pay | Admitting: *Deleted

## 2023-09-02 ENCOUNTER — Telehealth: Payer: Self-pay | Admitting: Cardiology

## 2023-09-02 ENCOUNTER — Ambulatory Visit: Payer: 59 | Admitting: Cardiology

## 2023-09-02 NOTE — Telephone Encounter (Signed)
 [facility] advising of appointment cancellation due to inclement weather. Main office number provided to reschedule.

## 2023-09-02 NOTE — Telephone Encounter (Signed)
*  STAT* If patient is at the pharmacy, call can be transferred to refill team.   1. Which medications need to be refilled? (please list name of each medication and dose if known)     atorvastatin  (LIPITOR ) 80 MG tablet    carvedilol  (COREG ) 25 MG tablet    ENTRESTO  97-103 MG   JARDIANCE  10 MG TABS tablet  ezetimibe  (ZETIA ) 10 MG tablet     4. Which pharmacy/location (including street and city if local pharmacy) is medication to be sent to? [id number] - [place], [place] - [address] AT [facility] [address]     5. Do they need a 30 day or 90 day supply? 90   Pt is scheduled for 09/15/22, pt had to r/s due to weather

## 2023-09-03 NOTE — Telephone Encounter (Signed)
 Patient was rescheduled to 09/16/23 with Dr. [place].

## 2023-09-07 NOTE — Progress Notes (Signed)
Cardiology Office Note:    Date:  09/16/2023   ID:  Martin Archer, DOB [birth year], MRN [id number]  PCP:  Martin Levins, MD   [facility] Providers Cardiologist:  Lance Muss, MD     Referring MD: Martin Levins, MD   Chief Complaint  Patient presents with   Coronary Artery Disease    History of Present Illness:    Martin Archer is a 63 y.o. male seen for follow up CAD and CHF. He is a former patient of Dr Eldridge Dace. Also followed by Dr Kirke Corin for PAD. He has  a PMH of HTN, tobacco use, HLD, carotid artery disease s/p bilateral endarterectomy with redo 2022, chronic systolic HF- EF 40-45%, TIAs, anemia, CKD, GI bleed, PAD s/p VBX stent placed to the left subclavian artery, CAD s/p [facility] 2022 with CTO of RCA and mild nonobstructive disease in remaining arteries.   He is a recovering alcoholic - quit 2018. He still smokes. Unable to quit with Chantix.   He currently denies any chest pain, dyspnea, palpitations. He is doing PT for his back which includes some walking. He is followed by Nephrology for CKD.   Past Medical History:  Diagnosis Date   Anemia, unspecified    Arthritis    Diverticulosis of colon (without mention of hemorrhage)    Gastric ulcer with hemorrhage and obstruction 2011   Dr Julien Girt GI   GERD (gastroesophageal reflux disease)    hx   Hyperlipemia    Hypertension    Personal history of colonic polyps 01/30/2010   hyperplastic rectal   Sleep apnea    on CPAP; [facility]   Stroke [facility])    mini strokes-bilateral carotid endarterectomy   Substance abuse (HCC)    History of alcohol abuse    Past Surgical History:  Procedure Laterality Date   AORTIC ARCH ANGIOGRAPHY N/A 04/26/2021   Procedure: AORTIC ARCH ANGIOGRAPHY;  Surgeon: Iran Ouch, MD;  [facility] IN[facility] LAB;  Service: Cardiovascular;  Laterality: N/A;   CARDIAC CATHETERIZATION  01/25/2021   CAROTID ENDARTERECTOMY  11/2006   left Dr Jerilee Field   COLONOSCOPY  2011   DP-positive FOBT/IDA   ENDARTERECTOMY Right 01/19/2015   Procedure: RIGHT CAROTID ENDARTERECTOMY WITH PATCH ANGIOPLASTY;  Surgeon: Pryor Ochoa, MD;  Location: [facility] OR;  Service: Vascular;  Laterality: Right;   ENDARTERECTOMY Right 06/27/2021   Procedure: RIGHT REDO CAROTID ARTERY ENDARTERECTOMY & PLACEMENT OF 15Fr DRAIN;  Surgeon: Chuck Hint, MD;  Location: [facility] OR;  Service: Vascular;  Laterality: Right;   ESOPHAGOGASTRODUODENOSCOPY Left 06/17/2013   Procedure: ESOPHAGOGASTRODUODENOSCOPY (EGD);  Surgeon: Willis Modena, MD;  Location: [facility] ENDOSCOPY;  Service: Endoscopy;  Laterality: Left;   ESOPHAGOGASTRODUODENOSCOPY N/A 09/20/2015   Procedure: ESOPHAGOGASTRODUODENOSCOPY (EGD);  Surgeon: Meryl Dare, MD;  Location: [facility] ENDOSCOPY;  Service: Endoscopy;  Laterality: N/A;   FOOT SURGERY Bilateral 2020   Taylor's foot   INGUINAL HERNIA REPAIR  1963   NASAL FRACTURE SURGERY  1974   PATCH ANGIOPLASTY Right 06/27/2021   Procedure: PATCH ANGIOPLASTY USING Livia Snellen BIOLOGIC PATCH;  Surgeon: Chuck Hint, MD;  Location: [facility] OR;  Service: Vascular;  Laterality: Right;   PERIPHERAL VASCULAR INTERVENTION  04/26/2021   Procedure: PERIPHERAL VASCULAR INTERVENTION;  Surgeon: Iran Ouch, MD;  [facility];  Service: Cardiovascular;;   RIGHT/LEFT HEART CATH AND CORONARY ANGIOGRAPHY N/A 01/25/2021   Procedure: RIGHT/LEFT HEART CATH AND CORONARY ANGIOGRAPHY;  Surgeon: Corky Crafts, MD;  [facility];  Service: Cardiovascular;  Laterality: N/A;   TEE WITHOUT CARDIOVERSION N/A 01/17/2015   Procedure: TRANSESOPHAGEAL ECHOCARDIOGRAM (TEE);  Surgeon: Thurmon Fair, MD;  Location: [facility] ENDOSCOPY;  Service: Cardiovascular;  Laterality: N/A;   WISDOM TOOTH EXTRACTION      Current Medications: Current Meds  Medication Sig   acetaminophen (TYLENOL) 500 MG tablet Take 500 mg by mouth every 6 (six) hours as needed.    amLODipine (NORVASC) 10 MG tablet Take 10 mg by mouth daily.   aspirin EC 81 MG tablet Take 1 tablet (81 mg total) by mouth daily. Swallow whole.   atorvastatin (LIPITOR) 80 MG tablet TAKE 1 TABLET BY MOUTH DAILY   Carboxymethylcellul-Glycerin (LUBRICATING EYE DROPS OP) Place 1 drop into both eyes daily as needed (dry eyes).   carvedilol (COREG) 25 MG tablet TAKE 1 TABLET(25 MG) BY MOUTH TWICE DAILY   clopidogrel (PLAVIX) 75 MG tablet TAKE 1 TABLET BY MOUTH DAILY WITH BREAKFAST   doxylamine, Sleep, (UNISOM) 25 MG tablet Take 25 mg by mouth at bedtime as needed for sleep.   Dulaglutide (TRULICITY) 0.75 MG/0.5ML SOPN Inject 0.75 mg into the skin once a week.   ENTRESTO 97-103 MG TAKE 1 TABLET BY MOUTH TWICE DAILY   ezetimibe (ZETIA) 10 MG tablet TAKE 1 TABLET(10 MG) BY MOUTH DAILY   furosemide (LASIX) 20 MG tablet TAKE 1 TABLET(20 MG) BY MOUTH DAILY AS NEEDED FOR LOWER EXTREMITY SWELLING   gabapentin (NEURONTIN) 300 MG capsule Take 1 capsule (300 mg total) by mouth 3 (three) times daily as needed.   JARDIANCE 10 MG TABS tablet TAKE 1 TABLET(10 MG) BY MOUTH DAILY BEFORE BREAKFAST   Multiple Vitamin (MULTIVITAMIN WITH MINERALS) TABS tablet Take 1 tablet by mouth daily.   sildenafil (VIAGRA) 100 MG tablet Take 0.5-1 tablets (50-100 mg total) by mouth daily as needed for erectile dysfunction.   tirzepatide [facility]) 5 MG/0.5ML Pen Inject 5 mg into the skin once a week.   triamcinolone cream (KENALOG) 0.1 % Apply topically 2 (two) times daily as needed.     Allergies:   Adderall [amphetamine-dextroamphet er], Prozac [fluoxetine hcl], Sertraline hcl, and Advil [ibuprofen]   Social History   Socioeconomic History   Marital status: Divorced    Spouse name: Not on file   Number of children: 2   Years of education: Not on file   Highest education level: Not on file  Occupational History   Occupation: self employeed Investment banker, corporate: [employer]   Tobacco Use   Smoking status: Former     Current packs/day: 0.00    Types: Cigarettes    Quit date: 06/22/2015    Years since quitting: 8.2   Smokeless tobacco: Never   Tobacco comments:    smoked 1976-2007 & 2009- present , up to 2 ppd, average > 1ppd  Vaping Use   Vaping status: Never Used  Substance and Sexual Activity   Alcohol use: Not Currently    Alcohol/week: 0.0 standard drinks of alcohol   Drug use: No   Sexual activity: Yes  Other Topics Concern   Not on file  Social History Narrative   Lives alone.  Daughter lives in area.    Social Drivers of Health   Financial Resource Strain: Low Risk  (02/19/2023)   Overall Financial Resource Strain (CARDIA)    Difficulty of Paying Living Expenses: Not very hard  Food Insecurity: No Food Insecurity (02/19/2023)   Hunger Vital Sign    Worried  About Running Out of Food in the Last Year: Never true    Ran Out of Food in the Last Year: Never true  Transportation Needs: No Transportation Needs (02/19/2023)   PRAPARE - Administrator, Civil Service (Medical): No    Lack of Transportation (Non-Medical): No  Physical Activity: Insufficiently Active (04/02/2023)   Exercise Vital Sign    Days of Exercise per Week: 3 days    Minutes of Exercise per Session: 10 min  Stress: Stress Concern Present (04/02/2023)   [employer] of Occupational Health - Occupational Stress Questionnaire    Feeling of Stress : To some extent  Social Connections: Unknown (02/19/2023)   Social Connection and Isolation Panel [NHANES]    Frequency of Communication with Friends and Family: More than three times a week    Frequency of Social Gatherings with Friends and Family: More than three times a week    Attends Religious Services: Not on Marketing executive or Organizations: Yes    Attends Engineer, structural: More than 4 times per year    Marital Status: Divorced     Family History: The patient's family history includes Cancer in his maternal grandfather; Diabetes  in his father and paternal grandfather; Heart attack in his mother; Heart attack (age of onset: 37) in his brother; Heart attack (age of onset: 59) in his maternal grandfather; Hypertension in his father; Stroke in his father and paternal uncle. There is no history of Colon cancer, Esophageal cancer, Stomach cancer, Colon polyps, or Rectal cancer.  ROS:   Please see the history of present illness.     All other systems reviewed and are negative.  EKGs/Labs/Other Studies Reviewed:    The following studies were reviewed today:  Echo 12/20/20: IMPRESSIONS     1. Left ventricular ejection fraction, by estimation, is 40 to 45%. The  left ventricle has mildly decreased function. The left ventricle  demonstrates global hypokinesis. The left ventricular internal cavity size  was moderately dilated. Left ventricular  diastolic parameters are consistent with Grade III diastolic dysfunction  (restrictive). Elevated left ventricular end-diastolic pressure.   2. Right ventricular systolic function is normal. The right ventricular  size is normal. There is mildly elevated pulmonary artery systolic  pressure. The estimated right ventricular systolic pressure is 40.2 mmHg.   3. Left atrial size was severely dilated.   4. Right atrial size was severely dilated.   5. The mitral valve is normal in structure. Mild mitral valve  regurgitation. No evidence of mitral stenosis.   6. The aortic valve has an indeterminant number of cusps. Aortic valve  regurgitation is not visualized. Mild to moderate aortic valve  sclerosis/calcification is present, without any evidence of aortic  stenosis.   7. Aortic dilatation noted. There is mild dilatation of the aortic root,  measuring 38 mm. There is mild dilatation of the ascending aorta,  measuring 43 mm.   8. The inferior vena cava is normal in size with greater than 50%  respiratory variability, suggesting right atrial pressure of 3 mmHg.   9. Compared to prior  echo, LVF has declined.   Cardiac cath 01/25/21: RIGHT/LEFT HEART CATH AND CORONARY ANGIOGRAPHY   Conclusion    Prox RCA lesion is 100% stenosed. Left to right collaterals. 1st Diag lesion is 25% stenosed. 3rd Mrg lesion is 70% stenosed. LV end diastolic pressure is normal. There is no aortic valve stenosis. Hemodynamic findings consistent with mild pulmonary hypertension. Ao  sat 94%, PA sat 67%, mean PA pressure 27 mm Hg; mean PCWP 13 mm Hg; CO 4.6 L/min; CI 2.26   LV dysfuction out of proportion to the degree of CAD.   Medical therapy for heart failure.   Diagnostic Dominance: Right        Event monitor 02/25/23: Study Highlights      Normal sinus rhythm with rare PACs and rare PVCs.   Brief runs of PACs.   No symptoms noted.   No atrial fibrillation.     Patch Wear Time:  6 days and 5 hours (2024-06-18T11:12:45-0400 to 2024-06-24T16:59:37-0400)   Patient had a min HR of 64 bpm, max HR of 207 bpm, and avg HR of 97 bpm. Predominant underlying rhythm was Sinus Rhythm. 5 Supraventricular Tachycardia runs occurred, the run with the fastest interval lasting 8 beats with a max rate of 207 bpm, the  longest lasting 8 beats with an avg rate of 140 bpm. Some episodes of Supraventricular Tachycardia may be possible Atrial Tachycardia with variable block. Isolated SVEs were rare (<1.0%), SVE Couplets were rare (<1.0%), and SVE Triplets were rare  (<1.0%). Isolated VEs were rare (<1.0%), and no VE Couplets or VE Triplets were present.   Recent Labs: 10/01/2022: ALT 16; BUN 34; Creatinine, Ser 2.13; Hemoglobin 14.7; Platelets 224.0; Potassium 5.7 No hemolysis seen; Sodium 136 02/12/2023: Magnesium 2.2; TSH 1.700  Recent Lipid Panel    Component Value Date/Time   CHOL 119 10/01/2022 1105   CHOL 124 04/13/2021 1018   CHOL 286 (H) 10/12/2013 0436   TRIG 178.0 (H) 10/01/2022 1105   TRIG 572 (H) 10/12/2013 0436   TRIG 284 (HH) 07/15/2006 1158   HDL 34.90 (L) 10/01/2022 1105   HDL 35 (L)  04/13/2021 1018   HDL 35 (L) 10/12/2013 0436   CHOLHDL 3 10/01/2022 1105   VLDL 35.6 10/01/2022 1105   LDLCALC 48 10/01/2022 1105   LDLCALC 59 04/13/2021 1018   LDLCALC  03/23/2020 1601     Comment:     . LDL cholesterol not calculated. Triglyceride levels greater than 400 mg/dL invalidate calculated LDL results. . Reference range: <100 . Desirable range <100 mg/dL for primary prevention;   <70 mg/dL for patients with CHD or diabetic patients  with > or = 2 CHD risk factors. Marland Kitchen LDL-C is now calculated using the Martin-Hopkins  calculation, which is a validated novel method providing  better accuracy than the Friedewald equation in the  estimation of LDL-C.  Horald Pollen et al. Lenox Ahr. 1610;960(45): 2061-2068  (http://education.Quest[employer].com/faq/FAQ164)    LDLCALC NOT CALC 10/12/2013 0436   LDLDIRECT 48.0 03/28/2022 1629     Risk Assessment/Calculations:                Physical Exam:    VS:  BP 120/82   Pulse 87   Ht 5\' 8"  (1.727 m)   Wt 188 lb (85.3 kg)   SpO2 98%   BMI 28.59 kg/m     Wt Readings from Last 3 Encounters:  09/16/23 188 lb (85.3 kg)  08/30/23 187 lb (84.8 kg)  06/03/23 186 lb (84.4 kg)     GEN:  Well nourished, well developed in no acute distress HEENT: Normal NECK: No JVD; No carotid bruits LYMPHATICS: No lymphadenopathy CARDIAC: RRR, no murmurs, rubs, gallops RESPIRATORY:  Clear to auscultation without rales, wheezing or rhonchi  ABDOMEN: Soft, non-tender, non-distended MUSCULOSKELETAL:  No edema; No deformity  SKIN: Warm and dry NEUROLOGIC:  Alert and oriented x 3 PSYCHIATRIC:  Normal  affect   ASSESSMENT:    1. Coronary artery disease involving native coronary artery of native heart without angina pectoris   2. Chronic systolic heart failure (HCC)   3. Subclavian artery stenosis, left (HCC)   4. PAD (peripheral artery disease) (HCC)   5. Hyperlipidemia, unspecified hyperlipidemia type    PLAN:    In order of problems listed  above:  CAD: No angina on medical therapy.  Known CTO of the RCA.  Continue aggressive secondary prevention.  Chronic systolic heart failure: Appears euvolemic.  He is on good heart failure therapy with Sherryll Burger, Coreg, Jardiance.  Last creatinine on file is 1.85.  Would not add aldactone given CKD and tendency for hyperkalemia. Will update Echo.  HTN: well controlled on current therapy  Carotid artery disease/subclavian disease: s/p subclavian stent.  Known left carotid occlusion  PAD: Managed with Dr. Kirke Corin.  Hyperlipidemia: LDL 48.  Continue lipid-lowering therapy. Will update labs today CRI: Cr 1.85.  Tight BP controlled.  Left kidney atrophied. Followed with Nephrology.   Avoid NSAIDs.  Stay well hydrated.  Tobacco abuse. Encourage smoking cessation Recovering alcoholic.            Medication Adjustments/Labs and Tests Ordered: Current medicines are reviewed at length with the patient today.  Concerns regarding medicines are outlined above.  Orders Placed This Encounter  Procedures   CBC w/Diff/Platelet   HgB A1c   Lipid panel   ECHOCARDIOGRAM COMPLETE   No orders of the defined types were placed in this encounter.   Patient Instructions  Medication Instructions:  Continue all medications *If you need a refill on your cardiac medications before your next appointment, please call your pharmacy*   Lab Work: Cbc,A1c,lipid panel today   Testing/Procedures: Echo  first available   Follow-Up: At [facility], you and your health needs are our priority.  As part of our continuing mission to provide you with exceptional heart care, we have created designated Provider Care Teams.  These Care Teams include your primary Cardiologist (physician) and Advanced Practice Providers (APPs -  Physician Assistants and Nurse Practitioners) who all work together to provide you with the care you need, when you need it.  We recommend signing up for the patient portal called  "MyChart".  Sign up information is provided on this After Visit Summary.  MyChart is used to connect with patients for Virtual Visits (Telemedicine).  Patients are able to view lab/test results, encounter notes, upcoming appointments, etc.  Non-urgent messages can be sent to your provider as well.   To learn more about what you can do with MyChart, go to [email or web address].    Your next appointment:  6 months   Call  in April to schedule July appointment  ( NEW OFFICE )    Provider:  Dr.Jaidah Lomax          Signed, Austen Oyster [place], MD  09/16/2023 10:53 AM    [facility]  Cardiology Office Note:    Date:  09/16/2023   ID:  Martin Gallo Pfenning, DOB 1961-08-20, MRN [id number]  PCP:  Martin Levins, MD   [facility] Providers Cardiologist:  Lance Muss, MD     Referring MD: Martin Levins, MD   Chief Complaint  Patient presents with   Coronary Artery Disease    History of Present Illness:    Martin Archer is a 63 y.o. male seen for follow up CAD and CHF. He is a former patient of Dr Eldridge Dace. Also followed  by Dr Kirke Corin for PAD. He has  a PMH of HTN, tobacco use, HLD, carotid artery disease s/p bilateral endarterectomy with redo 2022, chronic systolic HF- EF 40-45%, TIAs, anemia, CKD, GI bleed, PAD s/p VBX stent placed to the left subclavian artery, CAD s/p [facility] 2022 with CTO of RCA and mild nonobstructive disease in remaining arteries.   Past Medical History:  Diagnosis Date   Anemia, unspecified    Arthritis    Diverticulosis of colon (without mention of hemorrhage)    Gastric ulcer with hemorrhage and obstruction 2011   Dr Julien Girt GI   GERD (gastroesophageal reflux disease)    hx   Hyperlipemia    Hypertension    Personal history of colonic polyps 01/30/2010   hyperplastic rectal   Sleep apnea    on CPAP; [facility]   Stroke [facility])    mini strokes-bilateral carotid endarterectomy   Substance abuse (HCC)    History  of alcohol abuse    Past Surgical History:  Procedure Laterality Date   AORTIC ARCH ANGIOGRAPHY N/A 04/26/2021   Procedure: AORTIC ARCH ANGIOGRAPHY;  Surgeon: Iran Ouch, MD;  [facility] INV[facility] CV LAB;  Service: Cardiovascular;  Laterality: N/A;   CARDIAC CATHETERIZATION  01/25/2021   CAROTID ENDARTERECTOMY  11/2006   left Dr Jerilee Field   COLONOSCOPY  2011   DP-positive FOBT/IDA   ENDARTERECTOMY Right 01/19/2015   Procedure: RIGHT CAROTID ENDARTERECTOMY WITH PATCH ANGIOPLASTY;  Surgeon: Pryor Ochoa, MD;  Location: [facility] OR;  Service: Vascular;  Laterality: Right;   ENDARTERECTOMY Right 06/27/2021   Procedure: RIGHT REDO CAROTID ARTERY ENDARTERECTOMY & PLACEMENT OF 15Fr DRAIN;  Surgeon: Chuck Hint, MD;  Location: [facility] OR;  Service: Vascular;  Laterality: Right;   ESOPHAGOGASTRODUODENOSCOPY Left 06/17/2013   Procedure: ESOPHAGOGASTRODUODENOSCOPY (EGD);  Surgeon: Willis Modena, MD;  Location: [facility] ENDOSCOPY;  Service: Endoscopy;  Laterality: Left;   ESOPHAGOGASTRODUODENOSCOPY N/A 09/20/2015   Procedure: ESOPHAGOGASTRODUODENOSCOPY (EGD);  Surgeon: Meryl Dare, MD;  [facility] ENDOSCOPY;  Service: Endoscopy;  Laterality: N/A;   FOOT SURGERY Bilateral 2020   Taylor's foot   INGUINAL HERNIA REPAIR  1963   NASAL FRACTURE SURGERY  1974   PATCH ANGIOPLASTY Right 06/27/2021   Procedure: PATCH ANGIOPLASTY USING Livia Snellen BIOLOGIC PATCH;  Surgeon: Chuck Hint, MD;  Location: [facility] OR;  Service: Vascular;  Laterality: Right;   PERIPHERAL VASCULAR INTERVENTION  04/26/2021   Procedure: PERIPHERAL VASCULAR INTERVENTION;  Surgeon: Iran Ouch, MD;  [facility];  Service: Cardiovascular;;   RIGHT/LEFT HEART CATH AND CORONARY ANGIOGRAPHY N/A 01/25/2021   Procedure: RIGHT/LEFT HEART CATH AND CORONARY ANGIOGRAPHY;  Surgeon: Corky Crafts, MD;  Location: [facility] INVASIVE CV LAB;  Service: Cardiovascular;  Laterality: N/A;   TEE WITHOUT CARDIOVERSION N/A  01/17/2015   Procedure: TRANSESOPHAGEAL ECHOCARDIOGRAM (TEE);  Surgeon: Thurmon Fair, MD;  Location: [facility] ENDOSCOPY;  Service: Cardiovascular;  Laterality: N/A;   WISDOM TOOTH EXTRACTION      Current Medications: Current Meds  Medication Sig   acetaminophen (TYLENOL) 500 MG tablet Take 500 mg by mouth every 6 (six) hours as needed.   amLODipine (NORVASC) 10 MG tablet Take 10 mg by mouth daily.   aspirin EC 81 MG tablet Take 1 tablet (81 mg total) by mouth daily. Swallow whole.   atorvastatin (LIPITOR) 80 MG tablet TAKE 1 TABLET BY MOUTH DAILY   Carboxymethylcellul-Glycerin (LUBRICATING EYE DROPS OP) Place 1 drop into both eyes daily as needed (dry eyes).   carvedilol (COREG)  25 MG tablet TAKE 1 TABLET(25 MG) BY MOUTH TWICE DAILY   clopidogrel (PLAVIX) 75 MG tablet TAKE 1 TABLET BY MOUTH DAILY WITH BREAKFAST   doxylamine, Sleep, (UNISOM) 25 MG tablet Take 25 mg by mouth at bedtime as needed for sleep.   Dulaglutide (TRULICITY) 0.75 MG/0.5ML SOPN Inject 0.75 mg into the skin once a week.   ENTRESTO 97-103 MG TAKE 1 TABLET BY MOUTH TWICE DAILY   ezetimibe (ZETIA) 10 MG tablet TAKE 1 TABLET(10 MG) BY MOUTH DAILY   furosemide (LASIX) 20 MG tablet TAKE 1 TABLET(20 MG) BY MOUTH DAILY AS NEEDED FOR LOWER EXTREMITY SWELLING   gabapentin (NEURONTIN) 300 MG capsule Take 1 capsule (300 mg total) by mouth 3 (three) times daily as needed.   JARDIANCE 10 MG TABS tablet TAKE 1 TABLET(10 MG) BY MOUTH DAILY BEFORE BREAKFAST   Multiple Vitamin (MULTIVITAMIN WITH MINERALS) TABS tablet Take 1 tablet by mouth daily.   sildenafil (VIAGRA) 100 MG tablet Take 0.5-1 tablets (50-100 mg total) by mouth daily as needed for erectile dysfunction.   tirzepatide [facility]) 5 MG/0.5ML Pen Inject 5 mg into the skin once a week.   triamcinolone cream (KENALOG) 0.1 % Apply topically 2 (two) times daily as needed.     Allergies:   Adderall [amphetamine-dextroamphet er], Prozac [fluoxetine hcl], Sertraline hcl, and Advil  [ibuprofen]   Social History   Socioeconomic History   Marital status: Divorced    Spouse name: Not on file   Number of children: 2   Years of education: Not on file   Highest education level: Not on file  Occupational History   Occupation: self employeed Investment banker, corporate: [employer]   Tobacco Use   Smoking status: Former    Current packs/day: 0.00    Types: Cigarettes    Quit date: 06/22/2015    Years since quitting: 8.2   Smokeless tobacco: Never   Tobacco comments:    smoked 1976-2007 & 2009- present , up to 2 ppd, average > 1ppd  Vaping Use   Vaping status: Never Used  Substance and Sexual Activity   Alcohol use: Not Currently    Alcohol/week: 0.0 standard drinks of alcohol   Drug use: No   Sexual activity: Yes  Other Topics Concern   Not on file  Social History Narrative   Lives alone.  Daughter lives in area.    Social Drivers of Corporate investment banker Strain: Low Risk  (02/19/2023)   Overall Financial Resource Strain (CARDIA)    Difficulty of Paying Living Expenses: Not very hard  Food Insecurity: No Food Insecurity (02/19/2023)   Hunger Vital Sign    Worried About Running Out of Food in the Last Year: Never true    Ran Out of Food in the Last Year: Never true  Transportation Needs: No Transportation Needs (02/19/2023)   PRAPARE - Administrator, Civil Service (Medical): No    Lack of Transportation (Non-Medical): No  Physical Activity: Insufficiently Active (04/02/2023)   Exercise Vital Sign    Days of Exercise per Week: 3 days    Minutes of Exercise per Session: 10 min  Stress: Stress Concern Present (04/02/2023)   [employer] of Occupational Health - Occupational Stress Questionnaire    Feeling of Stress : To some extent  Social Connections: Unknown (02/19/2023)   Social Connection and Isolation Panel [NHANES]    Frequency of Communication with Friends and Family: More than three times a week  Frequency of Social Gatherings with  Friends and Family: More than three times a week    Attends Religious Services: Not on file    Active Member of Clubs or Organizations: Yes    Attends Engineer, structural: More than 4 times per year    Marital Status: Divorced     Family History: The patient's family history includes Cancer in his maternal grandfather; Diabetes in his father and paternal grandfather; Heart attack in his mother; Heart attack (age of onset: 41) in his brother; Heart attack (age of onset: 48) in his maternal grandfather; Hypertension in his father; Stroke in his father and paternal uncle. There is no history of Colon cancer, Esophageal cancer, Stomach cancer, Colon polyps, or Rectal cancer.  ROS:   Please see the history of present illness.     All other systems reviewed and are negative.  EKGs/Labs/Other Studies Reviewed:    The following studies were reviewed today:  Echo 12/20/20: IMPRESSIONS     1. Left ventricular ejection fraction, by estimation, is 40 to 45%. The  left ventricle has mildly decreased function. The left ventricle  demonstrates global hypokinesis. The left ventricular internal cavity size  was moderately dilated. Left ventricular  diastolic parameters are consistent with Grade III diastolic dysfunction  (restrictive). Elevated left ventricular end-diastolic pressure.   2. Right ventricular systolic function is normal. The right ventricular  size is normal. There is mildly elevated pulmonary artery systolic  pressure. The estimated right ventricular systolic pressure is 40.2 mmHg.   3. Left atrial size was severely dilated.   4. Right atrial size was severely dilated.   5. The mitral valve is normal in structure. Mild mitral valve  regurgitation. No evidence of mitral stenosis.   6. The aortic valve has an indeterminant number of cusps. Aortic valve  regurgitation is not visualized. Mild to moderate aortic valve  sclerosis/calcification is present, without any evidence of  aortic  stenosis.   7. Aortic dilatation noted. There is mild dilatation of the aortic root,  measuring 38 mm. There is mild dilatation of the ascending aorta,  measuring 43 mm.   8. The inferior vena cava is normal in size with greater than 50%  respiratory variability, suggesting right atrial pressure of 3 mmHg.   9. Compared to prior echo, LVF has declined.   Cardiac cath 01/25/21: RIGHT/LEFT HEART CATH AND CORONARY ANGIOGRAPHY   Conclusion    Prox RCA lesion is 100% stenosed. Left to right collaterals. 1st Diag lesion is 25% stenosed. 3rd Mrg lesion is 70% stenosed. LV end diastolic pressure is normal. There is no aortic valve stenosis. Hemodynamic findings consistent with mild pulmonary hypertension. Ao sat 94%, PA sat 67%, mean PA pressure 27 mm Hg; mean PCWP 13 mm Hg; CO 4.6 L/min; CI 2.26   LV dysfuction out of proportion to the degree of CAD.   Medical therapy for heart failure.   Diagnostic Dominance: Right        Event monitor 02/25/23: Study Highlights      Normal sinus rhythm with rare PACs and rare PVCs.   Brief runs of PACs.   No symptoms noted.   No atrial fibrillation.     Patch Wear Time:  6 days and 5 hours (2024-06-18T11:12:45-0400 to 2024-06-24T16:59:37-0400)   Patient had a min HR of 64 bpm, max HR of 207 bpm, and avg HR of 97 bpm. Predominant underlying rhythm was Sinus Rhythm. 5 Supraventricular Tachycardia runs occurred, the run with the fastest interval lasting  8 beats with a max rate of 207 bpm, the  longest lasting 8 beats with an avg rate of 140 bpm. Some episodes of Supraventricular Tachycardia may be possible Atrial Tachycardia with variable block. Isolated SVEs were rare (<1.0%), SVE Couplets were rare (<1.0%), and SVE Triplets were rare  (<1.0%). Isolated VEs were rare (<1.0%), and no VE Couplets or VE Triplets were present.   Recent Labs: 10/01/2022: ALT 16; BUN 34; Creatinine, Ser 2.13; Hemoglobin 14.7; Platelets 224.0; Potassium 5.7 No  hemolysis seen; Sodium 136 02/12/2023: Magnesium 2.2; TSH 1.700  Recent Lipid Panel    Component Value Date/Time   CHOL 119 10/01/2022 1105   CHOL 124 04/13/2021 1018   CHOL 286 (H) 10/12/2013 0436   TRIG 178.0 (H) 10/01/2022 1105   TRIG 572 (H) 10/12/2013 0436   TRIG 284 (HH) 07/15/2006 1158   HDL 34.90 (L) 10/01/2022 1105   HDL 35 (L) 04/13/2021 1018   HDL 35 (L) 10/12/2013 0436   CHOLHDL 3 10/01/2022 1105   VLDL 35.6 10/01/2022 1105   LDLCALC 48 10/01/2022 1105   LDLCALC 59 04/13/2021 1018   LDLCALC  03/23/2020 1601     Comment:     . LDL cholesterol not calculated. Triglyceride levels greater than 400 mg/dL invalidate calculated LDL results. . Reference range: <100 . Desirable range <100 mg/dL for primary prevention;   <70 mg/dL for patients with CHD or diabetic patients  with > or = 2 CHD risk factors. Marland Kitchen LDL-C is now calculated using the Martin-Hopkins  calculation, which is a validated novel method providing  better accuracy than the Friedewald equation in the  estimation of LDL-C.  Horald Pollen et al. Lenox Ahr. 1610;960(45): 2061-2068  (http://education.[employer].com/faq/FAQ164)    LDLCALC NOT CALC 10/12/2013 0436   LDLDIRECT 48.0 03/28/2022 1629   Dated 08/08/23: normal CMET and CBC  Risk Assessment/Calculations:                Physical Exam:    VS:  BP 120/82   Pulse 87   Ht 5\' 8"  (1.727 m)   Wt 188 lb (85.3 kg)   SpO2 98%   BMI 28.59 kg/m     Wt Readings from Last 3 Encounters:  09/16/23 188 lb (85.3 kg)  08/30/23 187 lb (84.8 kg)  06/03/23 186 lb (84.4 kg)     GEN:  Well nourished, well developed in no acute distress HEENT: Normal NECK: No JVD; No carotid bruits LYMPHATICS: No lymphadenopathy CARDIAC: RRR, no murmurs, rubs, gallops RESPIRATORY:  Clear to auscultation without rales, wheezing or rhonchi  ABDOMEN: Soft, non-tender, non-distended MUSCULOSKELETAL:  No edema; No deformity  SKIN: Warm and dry NEUROLOGIC:  Alert and oriented  x 3 PSYCHIATRIC:  Normal affect   ASSESSMENT:    1. Coronary artery disease involving native coronary artery of native heart without angina pectoris   2. Chronic systolic heart failure (HCC)   3. Subclavian artery stenosis, left (HCC)   4. PAD (peripheral artery disease) (HCC)   5. Hyperlipidemia, unspecified hyperlipidemia type    PLAN:    In order of problems listed above:  CAD: No angina on medical therapy.  Known CTO of the RCA.  Continue aggressive secondary prevention.  Chronic systolic heart failure: Appears euvolemic.  He is on aggressive heart failure therapy.  Last creatinine on file is 2.2.  Would not add any further heart failure medicines such as spironolactone that could affect renal function. HTN: Amlodipine increased recently by renal per his report a few weeks ago.  Has not had morning meds yet this morning.  Check blood pressure readings at home after relaxing for at least 10 minutes.  Let us know via MyChart regarding home blood pressure readings.   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   PAD: Managed with Dr. Kirke Corin.  Hyperlipidemia: LDL 58.  Continue lipid-lowering therapy. CRI: Cr 2.23.  Tight BP controlled.  Left kidney atrophied. Followed with renal.  Avoid NSAIDs.  Stay well hydrated.            Medication Adjustments/Labs and Tests Ordered: Current medicines are reviewed at length with the patient today.  Concerns regarding medicines are outlined above.  Orders Placed This Encounter  Procedures   CBC w/Diff/Platelet   HgB A1c   Lipid panel   ECHOCARDIOGRAM COMPLETE   No orders of the defined types were placed in this encounter.   Patient Instructions  Medication Instructions:  Continue all medications *If you need a refill on your cardiac medications before your next appointment, please call your pharmacy*   Lab Work: Cbc,A1c,lipid panel today   Testing/Procedures: Echo  first available   Follow-Up: At [facility], you and your health needs are our priority.  As part of our continuing mission to provide you with exceptional heart care, we have created designated Provider Care Teams.  These Care Teams include your primary Cardiologist (physician) and Advanced Practice Providers (APPs -  Physician Assistants and Nurse Practitioners) who all work together to provide you with the care you need, when you need it.  We recommend signing up for the patient portal called "MyChart".  Sign up information is provided on this After Visit Summary.  MyChart is used to connect with patients for Virtual Visits (Telemedicine).  Patients are able to view lab/test results, encounter notes, upcoming appointments, etc.  Non-urgent messages can be sent to your provider as well.   To learn more about what you can do with MyChart, go to [email or web address].    Your next appointment:  6 months   Call  in April to schedule July appointment  ( NEW OFFICE )    Provider:  Dr.Orvill Coulthard          Signed, Guelda Batson [place], MD  09/16/2023 10:53 AM    [facility]

## 2023-09-11 ENCOUNTER — Other Ambulatory Visit: Payer: Self-pay

## 2023-09-11 ENCOUNTER — Encounter: Payer: Self-pay | Admitting: Physical Therapy

## 2023-09-11 ENCOUNTER — Ambulatory Visit: Payer: 59 | Attending: Family Medicine | Admitting: Physical Therapy

## 2023-09-11 DIAGNOSIS — M5432 Sciatica, left side: Secondary | ICD-10-CM

## 2023-09-11 DIAGNOSIS — M4300 Spondylolysis, site unspecified: Secondary | ICD-10-CM | POA: Diagnosis not present

## 2023-09-11 DIAGNOSIS — M6281 Muscle weakness (generalized): Secondary | ICD-10-CM | POA: Insufficient documentation

## 2023-09-11 DIAGNOSIS — M5442 Lumbago with sciatica, left side: Secondary | ICD-10-CM | POA: Diagnosis not present

## 2023-09-11 DIAGNOSIS — G8929 Other chronic pain: Secondary | ICD-10-CM | POA: Diagnosis present

## 2023-09-11 DIAGNOSIS — M5441 Lumbago with sciatica, right side: Secondary | ICD-10-CM | POA: Insufficient documentation

## 2023-09-11 NOTE — Therapy (Signed)
 OUTPATIENT PHYSICAL THERAPY THORACOLUMBAR EVALUATION   Patient Name: Martin Archer MRN: [id number] DOB:[birth year], 63 y.o., male Today's Date: 09/11/2023  END OF SESSION:  PT End of Session - 09/11/23 0855     Visit Number 1    Date for PT Re-Evaluation 11/06/23    Authorization Type UHC medicare/Medicaid secondary    PT Start Time 0851    PT Stop Time 0930    PT Time Calculation (min) 39 min    Activity Tolerance Patient tolerated treatment well             Past Medical History:  Diagnosis Date   Anemia, unspecified    Arthritis    Diverticulosis of colon (without mention of hemorrhage)    Gastric ulcer with hemorrhage and obstruction 2011   Dr Bernestine Brighter GI   GERD (gastroesophageal reflux disease)    hx   Hyperlipemia    Hypertension    Personal history of colonic polyps 01/30/2010   hyperplastic rectal   Sleep apnea    on CPAP;  Sleep Medicine   Stroke [facility])    mini strokes-bilateral carotid endarterectomy   Substance abuse (HCC)    History of alcohol  abuse   Past Surgical History:  Procedure Laterality Date   AORTIC ARCH ANGIOGRAPHY N/A 04/26/2021   Procedure: AORTIC ARCH ANGIOGRAPHY;  Surgeon: Wenona Hamilton, MD;  Location: [facility] INVASIVE CV LAB;  Service: Cardiovascular;  Laterality: N/A;   CARDIAC CATHETERIZATION  01/25/2021   CAROTID ENDARTERECTOMY  11/2006   left Dr Jackquelyn Mass   COLONOSCOPY  2011   DP-positive FOBT/IDA   ENDARTERECTOMY Right 01/19/2015   Procedure: RIGHT CAROTID ENDARTERECTOMY WITH PATCH ANGIOPLASTY;  Surgeon: Palma Bob, MD;  Location: [facility] OR;  Service: Vascular;  Laterality: Right;   ENDARTERECTOMY Right 06/27/2021   Procedure: RIGHT REDO CAROTID ARTERY ENDARTERECTOMY & PLACEMENT OF 15Fr DRAIN;  Surgeon: Dannis Dy, MD;  Location: [facility] OR;  Service: Vascular;  Laterality: Right;   ESOPHAGOGASTRODUODENOSCOPY Left 06/17/2013   Procedure: ESOPHAGOGASTRODUODENOSCOPY (EGD);  Surgeon: Evangeline Hilts, MD;   Location: [facility] ENDOSCOPY;  Service: Endoscopy;  Laterality: Left;   ESOPHAGOGASTRODUODENOSCOPY N/A 09/20/2015   Procedure: ESOPHAGOGASTRODUODENOSCOPY (EGD);  Surgeon: Asencion Blacksmith, MD;  Location: [facility] ENDOSCOPY;  Service: Endoscopy;  Laterality: N/A;   FOOT SURGERY Bilateral 2020   Taylor's foot   INGUINAL HERNIA REPAIR  1963   NASAL FRACTURE SURGERY  1974   PATCH ANGIOPLASTY Right 06/27/2021   Procedure: PATCH ANGIOPLASTY USING Corinna Dickens BIOLOGIC PATCH;  Surgeon: Dannis Dy, MD;  Location: [facility] OR;  Service: Vascular;  Laterality: Right;   PERIPHERAL VASCULAR INTERVENTION  04/26/2021   Procedure: PERIPHERAL VASCULAR INTERVENTION;  Surgeon: Wenona Hamilton, MD;  Location: [facility] INVASIVE CV LAB;  Service: Cardiovascular;;   RIGHT/LEFT HEART CATH AND CORONARY ANGIOGRAPHY N/A 01/25/2021   Procedure: RIGHT/LEFT HEART CATH AND CORONARY ANGIOGRAPHY;  Surgeon: Lucendia Rusk, MD;  Location: [facility] INVASIVE CV LAB;  Service: Cardiovascular;  Laterality: N/A;   TEE WITHOUT CARDIOVERSION N/A 01/17/2015   Procedure: TRANSESOPHAGEAL ECHOCARDIOGRAM (TEE);  Surgeon: Luana Rumple, MD;  Location: [facility] ENDOSCOPY;  Service: Cardiovascular;  Laterality: N/A;   WISDOM TOOTH EXTRACTION     Patient Active Problem List   Diagnosis Date Noted   BMI 33.0-33.9,adult 03/08/2023   Erectile dysfunction 10/03/2022   OSA on CPAP 04/10/2022   Skin lesion of left arm 03/29/2022   Diabetes (HCC) 09/24/2021   Vitamin D  deficiency 09/24/2021   Diarrhea 09/05/2021   Viral URI 09/05/2021  Sinusitis 09/05/2021   Carotid artery stenosis 06/27/2021   Intermittent pain and swelling of hand 05/29/2021   Stenosis of left subclavian artery (HCC) 04/26/2021   Chronic systolic heart failure (HCC)    Fatigue 12/19/2020   Dyspnea 12/19/2020   Spondylolisthesis of lumbosacral region 05/03/2020   CKD (chronic kidney disease) stage 4, GFR 15-29 ml/min (HCC) 09/26/2017   Syncope 09/26/2017   Elevated transaminase level  07/25/2017   Cannabis abuse 07/23/2017   Uncomplicated alcohol  dependence (HCC) 07/23/2017   Melena    Gastric ulcer with hemorrhage    Right hemiparesis (HCC) 09/18/2015   Chronic blood loss anemia 09/18/2015   Hematemesis 09/18/2015   Encounter for well adult exam with abnormal findings 06/29/2015   History of CEA (carotid endarterectomy) 03/25/2015   HLD (hyperlipidemia) 03/25/2015   Tobacco use disorder 03/25/2015   Carotid artery occlusion with infarction (HCC) 03/25/2015   Carotid aneurysm, 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   Stroke (HCC)    Aortic arch atherosclerosis (HCC)    Carotid stenosis 01/16/2015   Thrombus: BILATERAL EXTERNAL JUGULAR VEINS per CT angio head and neck 01/16/15 01/16/2015   DVT (deep venous thrombosis) (HCC)    Numbness and tingling of left arm and leg 01/15/2015   Cerebral infarction (HCC)    Paresthesia 12/08/2014   Alcohol  abuse, in remission 07/14/2013   ADD (attention deficit disorder) 07/14/2013   Nicotine  dependence 06/18/2013   Back pain, chronic 06/17/2013   ACUTE GASTRIC ULCER W/HEMORRHAGE AND OBSTRUCTION 02/28/2010   History of colonic polyps 02/28/2010   DIVERTICULOSIS, COLON 02/02/2010   ANEMIA, SEVERE 01/13/2010   Essential hypertension 01/13/2010   Sleep apnea 01/13/2010   Left carotid bruit 01/13/2010   MIXED HEARING LOSS BILATERAL 02/16/2009    PCP: Rosalia Colonel MD  REFERRING PROVIDER: Garlan Juniper MD  REFERRING DIAG: [phone], 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  More recently worsened.  Works part  time/semi retired.  I do some lifting.  It flared my back up.  Surgeon says it's up to me on surgery.   Had previous PT used a ball, balancing, TM, strengthening, stretching  "PT added to the pain"  "the last time it didn't help  and made me hurt more" Lost 20# taking injections I play golf but I haven't swung a club in the last few months 2-3 blocks max walk   Has a gym where he lives but does not use  PERTINENT HISTORY:  L5- S1 ESI 12/11; prior ESI "several" See cardiac history above HTN Vascular issues (may need stint in leg) Numbness in both feet On blood thinners Foot surgery 6 month old Grandtr in [place]  PAIN:   Are you having pain? Yes NPRS scale: 2/10 Pain location: bil low back pain and left LE especially knee to ankle; numbness in both feet Pain orientation: Bilateral  PAIN TYPE: aching Pain description: constant  Aggravating factors: my mattress (waken with low back pain); sleeping, walking Relieving factors: fetal position usually left side; sitting (sometimes relief, sometimes not)  PRECAUTIONS: None    WEIGHT BEARING RESTRICTIONS: No  FALLS:  Has patient fallen in last 6 months? No  OCCUPATION: part-time work 4-6 hours standing   PLOF: Independent with basic ADLs  PATIENT GOALS: I hope to loosen things up; maybe play golf; how can I alleviate the pain?;  go to the gym where I live  OBJECTIVE:  Note: Objective measures were completed at Evaluation unless otherwise noted.  DIAGNOSTIC FINDINGS:  MRI Nov '24 IMPRESSION: 1. 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 There is chronic severe foraminal narrowing bilaterally and probable chronic bilateral L5 nerve root encroachment. 2. Mild spondylosis at the other lumbar levels without significant spinal stenosis or nerve root encroachment. 3. No acute osseous findings.  PATIENT SURVEYS:  Modified Oswestry 40% pt left glasses in the car so  questionnaire done verbally and was time consuming   COGNITION: Overall cognitive status: Within functional limits for tasks assessed     MUSCLE LENGTH: Moderate decrease in HS and hip flexor lengths bilaterally POSTURE: decreased lumbar lordosis  PALPATION: Decreased fascial mobility in lumbar region bil  LUMBAR ROM:   AROM eval  Flexion 6 inches from floor  Extension 10 degrees  Right lateral flexion 20 degrees  Left lateral flexion 20 degrees  Right rotation   Left rotation    (Blank rows = not tested) TRUNK STRENGTH:  Decreased activation of transverse abdominus muscles; abdominals 4-/5; decreased activation of lumbar multifidi; trunk extensors 4-/5  LOWER EXTREMITY ROM:   grossly WFLs except decreased hip external rotation bil left more limited than right  LOWER EXTREMITY MMT:      MMT Right eval Left eval  Hip flexion 4+ 4+  Hip extension 4+ 4+  Hip abduction 4 4-  Hip adduction    Hip internal rotation    Hip external rotation    Knee flexion    Knee extension 4+ 4+  Ankle dorsiflexion    Ankle plantarflexion 4 4  Ankle inversion    Ankle eversion     (Blank rows = not tested)  LUMBAR SPECIAL TESTS:  Slump test: Negative  FUNCTIONAL TESTS:  Able to rise from the chair without UE assist Able to stoop to pick up small object with ease SLS < 3 sec bilaterally; much less stable on left with pelvic drop  GAIT:  Comments: WFLs for short distances   TREATMENT DATE: 09/11/23  Eval completed Discussed basic concepts of DN (pt never tried before)   PATIENT EDUCATION:  Education details: Educated patient on anatomy and physiology of current symptoms, prognosis, plan of care as well as initial self care strategies to promote recovery Person educated: Patient Education method: Explanation Education comprehension: verbalized  understanding  HOME EXERCISE PROGRAM: To be started  ASSESSMENT:  CLINICAL IMPRESSION: Patient is a 63 y.o. male who was seen today for physical therapy evaluation and treatment for chronic back pain with left-sided sciatica. The patient would benefit from PT to address trunk and hip range of motion deficits, strength asymmetries in lumbo/pelvic and hip regions and pain levels that are currently affecting activities of daily living at home and work including sitting, standing, sleeping, walking, lifting and performing hobbies and recreational activities including playing golf. Martin Archer has had multiple prior rounds of PT and associates a worsening of pain as a result and therefore fearful of exercise.  He would benefit from manual techniques for pain modulation and a graded exposure to exercise (slower progression).   OBJECTIVE IMPAIRMENTS: decreased activity tolerance, difficulty walking, decreased strength, increased fascial restrictions, impaired perceived functional ability, impaired flexibility, postural dysfunction, and pain.   ACTIVITY LIMITATIONS: carrying, lifting, sleeping, and locomotion level  PARTICIPATION LIMITATIONS: driving, community activity, and occupation  PERSONAL FACTORS: Past/current experiences, Time since onset of injury/illness/exacerbation, and 1-2 comorbidities: HTN; vascular issues   are also affecting patient's functional outcome.   REHAB POTENTIAL: Good  CLINICAL DECISION MAKING: Stable/uncomplicated  EVALUATION COMPLEXITY: Low   GOALS: Goals reviewed with patient? Yes  SHORT TERM GOALS: Target date: 10/09/2023    The patient will demonstrate knowledge of basic self care strategies and exercises to promote healing  Baseline: Goal status: INITIAL  2.  The patient will report a 30% improvement in pain levels with functional activities which are currently difficult including sleeping and walking Baseline:  Goal status: INITIAL  3.  The patient will have  improved trunk flexor and extensor muscle strength to at least 4/5 needed for lifting medium weight objects such as grocery bags, laundry and luggage  Baseline:  Goal status: INITIAL  4.  The patient will have improved left hip strength to at least 4+/5 needed for standing, walking longer distances at home and in the community  Baseline:  Goal status: INITIAL    LONG TERM GOALS: Target date: 11/06/2023    The patient will be independent in a safe self progression of a home and/or gym exercise program to promote further recovery of function   Baseline:  Goal status: INITIAL  2.  The patient will report a 60% improvement in pain levels with functional activities which are currently difficult including sleeping and walking Baseline:  Goal status: INITIAL  3.  The patient will have improved trunk flexor and extensor muscle strength to at least 4+/5 needed for lifting his 76 month old granddaughter Baseline:  Goal status: INITIAL  4.  The patient will be able to walk 900 feet in 6 minutes with pain level <5/10 Baseline:  Goal status: INITIAL  5.  Modified Oswestry score improved to 31% indicating improved function with less pain Baseline:  Goal status: INITIAL   PLAN:  PT FREQUENCY: 2x/week  PT DURATION: 8 weeks  PLANNED INTERVENTIONS: 97164- PT Re-evaluation, 97110-Therapeutic exercises, 97530- Therapeutic activity, [id number]- Neuromuscular re-education, 97535- Self Care, [id number]- Manual therapy, [id number]- Aquatic Therapy, 97014- Electrical stimulation (unattended), [id number]- Electrical stimulation (manual), [id number]- Ultrasound, [id number]- Traction (mechanical), [id number]- Ionotophoresis 4mg /ml Dexamethasone , Patient/Family education, Taping, Dry Needling,  Joint mobilization, Spinal manipulation, Spinal mobilization, Cryotherapy, and Moist heat.  PLAN FOR NEXT SESSION: dry needling to lumbar multifidi; lumbar and hip joint mobilization; graded exposure to exercise with bias toward flexion secondary  to stenosis; get baseline walking speed 2 or 3 MWT  Darien Eden, PT 09/11/23 6:40 PM Phone: [phone] Fax: [phone]

## 2023-09-12 ENCOUNTER — Ambulatory Visit: Payer: 59

## 2023-09-12 DIAGNOSIS — M6281 Muscle weakness (generalized): Secondary | ICD-10-CM

## 2023-09-12 DIAGNOSIS — M5432 Sciatica, left side: Secondary | ICD-10-CM

## 2023-09-12 DIAGNOSIS — G8929 Other chronic pain: Secondary | ICD-10-CM | POA: Diagnosis not present

## 2023-09-12 NOTE — Patient Instructions (Signed)
 Trigger Point Dry Needling  What is Trigger Point Dry Needling (DN)? DN is a physical therapy technique used to treat muscle pain and dysfunction. Specifically, DN helps deactivate muscle trigger points (muscle knots).  A thin filiform needle is used to penetrate the skin and stimulate the underlying trigger point. The goal is for a local twitch response (LTR) to occur and for the trigger point to relax. No medication of any kind is injected during the procedure.   What Does Trigger Point Dry Needling Feel Like?  The procedure feels different for each individual patient. Some patients report that they do not actually feel the needle enter the skin and overall the process is not painful. Very mild bleeding may occur. However, many patients feel a deep cramping in the muscle in which the needle was inserted. This is the local twitch response.   How Will I feel after the treatment? Soreness is normal, and the onset of soreness may not occur for a few hours. Typically this soreness does not last longer than two days.  Bruising is uncommon, however; ice can be used to decrease any possible bruising.  In rare cases feeling tired or nauseous after the treatment is normal. In addition, your symptoms may get worse before they get better, this period will typically not last longer than 24 hours.   What Can I do After My Treatment? Increase your hydration by drinking more water for the next 24 hours.  You may place ice or heat on the areas treated that have become sore, however, do not use heat on inflamed or bruised areas. Heat often brings more relief post needling. You can continue your regular activities, but vigorous activity is not recommended initially after the treatment for 24 hours. DN is best combined with other physical therapy such as strengthening, stretching, and other therapies.   What are the complications? While your therapist has had extensive training in minimizing the risks of trigger  point dry needling, it is important to understand the risks of any procedure.  Risks include bleeding, pain, fatigue, hematoma, infection, vertigo, nausea or nerve involvement. Monitor for any changes to your skin or sensation. Contact your therapist or MD with concerns.  A rare but serious complication is a pneumothorax over or near your middle and upper chest and back If you have dry needling in this area, monitor for the following symptoms: Shortness of breath on exertion and/or Difficulty taking a deep breath and/or Chest Pain and/or A dry cough If any of the above symptoms develop, please go to the nearest emergency room or call 911. Tell them you had dry needling over your thorax and report any symptoms you are having. Please follow-up with your treating therapist after you complete the medical evaluation.   [facility]  [address] [place] [address].  [phone]

## 2023-09-12 NOTE — Therapy (Addendum)
OUTPATIENT PHYSICAL THERAPY  TREATMENT   Patient Name: Martin Archer MRN: [id number] DOB:June 06, 1961, 63 y.o., male Today's Date: 09/12/2023  END OF SESSION:  PT End of Session - 09/12/23 0847     Visit Number 2    Date for PT Re-Evaluation 11/06/23    Authorization Type [employer]HC medicare/Medicaid secondary    Progress Note Due on Visit 10    PT Start Time 0801    PT Stop Time 0847    PT Time Calculation (min) 46 min    Activity Tolerance Patient tolerated treatment well    Behavior During Therapy [facility] for tasks assessed/performed              Past Medical History:  Diagnosis Date   Anemia, unspecified    Arthritis    Diverticulosis of colon (without mention of hemorrhage)    Gastric ulcer with hemorrhage and obstruction 2011   Dr Julien Girt [facility]   GERD (gastroesophageal reflux disease)    hx   Hyperlipemia    Hypertension    Personal history of colonic polyps 01/30/2010   hyperplastic rectal   Sleep apnea    on CPAP; [facility]   Stroke [facility])    mini strokes-bilateral carotid endarterectomy   Substance abuse (HCC)    History of alcohol abuse   Past Surgical History:  Procedure Laterality Date   AORTIC ARCH ANGIOGRAPHY N/A 04/26/2021   Procedure: AORTIC ARCH ANGIOGRAPHY;  Surgeon: Iran Ouch, MD;  Location: [facility] INVASIVE CV LAB;  Service: Cardiovascular;  Laterality: N/A;   CARDIAC CATHETERIZATION  01/25/2021   CAROTID ENDARTERECTOMY  11/2006   left Dr Jerilee Field   COLONOSCOPY  2011   DP-positive FOBT/IDA   ENDARTERECTOMY Right 01/19/2015   Procedure: RIGHT CAROTID ENDARTERECTOMY WITH PATCH ANGIOPLASTY;  Surgeon: Pryor Ochoa, MD;  Location: [facility] OR;  Service: Vascular;  Laterality: Right;   ENDARTERECTOMY Right 06/27/2021   Procedure: RIGHT REDO CAROTID ARTERY ENDARTERECTOMY & PLACEMENT OF 15Fr DRAIN;  Surgeon: Chuck Hint, MD;  Location: [facility] OR;  Service: Vascular;  Laterality: Right;   ESOPHAGOGASTRODUODENOSCOPY Left  06/17/2013   Procedure: ESOPHAGOGASTRODUODENOSCOPY (EGD);  Surgeon: Willis Modena, MD;  Location: [facility] ENDOSCOPY;  Service: Endoscopy;  Laterality: Left;   ESOPHAGOGASTRODUODENOSCOPY N/A 09/20/2015   Procedure: ESOPHAGOGASTRODUODENOSCOPY (EGD);  Surgeon: Meryl Dare, MD;  Location: [facility] ENDOSCOPY;  Service: Endoscopy;  Laterality: N/A;   FOOT SURGERY Bilateral 2020   Taylor's foot   INGUINAL HERNIA REPAIR  1963   NASAL FRACTURE SURGERY  1974   PATCH ANGIOPLASTY Right 06/27/2021   Procedure: PATCH ANGIOPLASTY USING Livia Snellen BIOLOGIC PATCH;  Surgeon: Chuck Hint, MD;  Location: [facility] OR;  Service: Vascular;  Laterality: Right;   PERIPHERAL VASCULAR INTERVENTION  04/26/2021   Procedure: PERIPHERAL VASCULAR INTERVENTION;  Surgeon: Iran Ouch, MD;  Location: [facility] INVASIVE CV LAB;  Service: Cardiovascular;;   RIGHT/LEFT HEART CATH AND CORONARY ANGIOGRAPHY N/A 01/25/2021   Procedure: RIGHT/LEFT HEART CATH AND CORONARY ANGIOGRAPHY;  Surgeon: Corky Crafts, MD;  Location: [facility] INVASIVE CV LAB;  Service: Cardiovascular;  Laterality: N/A;   TEE WITHOUT CARDIOVERSION N/A 01/17/2015   Procedure: TRANSESOPHAGEAL ECHOCARDIOGRAM (TEE);  Surgeon: Thurmon Fair, MD;  Location: [facility] ENDOSCOPY;  Service: Cardiovascular;  Laterality: N/A;   WISDOM TOOTH EXTRACTION     Patient Active Problem List   Diagnosis Date Noted   BMI 33.0-33.9,adult 03/08/2023   Erectile dysfunction 10/03/2022   OSA on CPAP 04/10/2022   Skin lesion of left arm 03/29/2022  Diabetes (HCC) 09/24/2021   Vitamin D deficiency 09/24/2021   Diarrhea 09/05/2021   Viral URI 09/05/2021   Sinusitis 09/05/2021   Carotid artery stenosis 06/27/2021   Intermittent pain and swelling of hand 05/29/2021   Stenosis of left subclavian artery (HCC) 04/26/2021   Chronic systolic heart failure (HCC)    Fatigue 12/19/2020   Dyspnea 12/19/2020   Spondylolisthesis of lumbosacral region 05/03/2020   CKD (chronic kidney disease) stage 4,  GFR 15-29 ml/min (HCC) 09/26/2017   Syncope 09/26/2017   Elevated transaminase level 07/25/2017   Cannabis abuse 07/23/2017   Uncomplicated alcohol dependence (HCC) 07/23/2017   Melena    Gastric ulcer with hemorrhage    Right hemiparesis (HCC) 09/18/2015   Chronic blood loss anemia 09/18/2015   Hematemesis 09/18/2015   Encounter for well adult exam with abnormal findings 06/29/2015   History of CEA (carotid endarterectomy) 03/25/2015   HLD (hyperlipidemia) 03/25/2015   Tobacco use disorder 03/25/2015   Carotid artery occlusion with infarction (HCC) 03/25/2015   Carotid aneurysm, 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   Stroke (HCC)    Aortic arch atherosclerosis (HCC)    Carotid stenosis 01/16/2015   Thrombus: BILATERAL EXTERNAL JUGULAR VEINS per CT angio head and neck 01/16/15 01/16/2015   DVT (deep venous thrombosis) (HCC)    Numbness and tingling of left arm and leg 01/15/2015   Cerebral infarction (HCC)    Paresthesia 12/08/2014   Alcohol abuse, in remission 07/14/2013   ADD (attention deficit disorder) 07/14/2013   Nicotine dependence 06/18/2013   Back pain, chronic 06/17/2013   ACUTE GASTRIC ULCER W/HEMORRHAGE AND OBSTRUCTION 02/28/2010   History of colonic polyps 02/28/2010   DIVERTICULOSIS, COLON 02/02/2010   ANEMIA, SEVERE 01/13/2010   Essential hypertension 01/13/2010   Sleep apnea 01/13/2010   Left carotid bruit 01/13/2010   MIXED HEARING LOSS BILATERAL 02/16/2009    PCP: Oliver Barre MD  REFERRING PROVIDER: Clementeen Graham MD  REFERRING DIAG: [phone], G89.29 chronic bilateral low back pain with left sided sciatica; M43.00 pars defect  Rationale for Evaluation and Treatment: Rehabilitation  THERAPY DIAG:  Back pain; weakness  ONSET DATE: > 1 year  SUBJECTIVE:                                                                                                                                                                                           SUBJECTIVE STATEMENT: I am stiff  today.  Feeling ok.  Looking for a new mattress.    Lost 20# taking injections I play golf but I haven't swung a club in the last few months 2-3 blocks max walk   Has a gym where he  lives but does not use  PERTINENT HISTORY:  L5- S1 ESI 12/11; prior ESI "several" See cardiac history above HTN Vascular issues (may need stint in leg) Numbness in both feet On blood thinners Foot surgery 20 month old Grandtr in [place]  PAIN: 09/12/23  Are you having pain? Yes NPRS scale: 2/10 (stiffness) Pain location: bil low back pain and left LE especially knee to ankle; numbness in both feet Pain orientation: Bilateral  PAIN TYPE: aching Pain description: constant  Aggravating factors: my mattress (waken with low back pain); sleeping, walking Relieving factors: fetal position usually left side; sitting (sometimes relief, sometimes not)  PRECAUTIONS: None    WEIGHT BEARING RESTRICTIONS: No  FALLS:  Has patient fallen in last 6 months? No  OCCUPATION: part-time work 4-6 hours standing   PLOF: Independent with basic ADLs  PATIENT GOALS: I hope to loosen things up; maybe play golf; how can I alleviate the pain?;  go to the gym where I live  OBJECTIVE:  Note: Objective measures were completed at Evaluation unless otherwise noted.  DIAGNOSTIC FINDINGS:  MRI Nov '24 IMPRESSION: 1. 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 There is chronic severe foraminal narrowing bilaterally and probable chronic bilateral L5 nerve root encroachment. 2. Mild spondylosis at the other lumbar levels without significant spinal stenosis or nerve root encroachment. 3. No acute osseous findings.  PATIENT SURVEYS:  Modified Oswestry 40% pt left glasses in the car so questionnaire done verbally and was time consuming   COGNITION: Overall cognitive status: Within functional limits for tasks assessed     MUSCLE LENGTH: Moderate decrease in  HS and hip flexor lengths bilaterally POSTURE: decreased lumbar lordosis  PALPATION: Decreased fascial mobility in lumbar region bil  LUMBAR ROM:   AROM eval  Flexion 6 inches from floor  Extension 10 degrees  Right lateral flexion 20 degrees  Left lateral flexion 20 degrees  Right rotation   Left rotation    (Blank rows = not tested) TRUNK STRENGTH:  Decreased activation of transverse abdominus muscles; abdominals 4-/5; decreased activation of lumbar multifidi; trunk extensors 4-/5  LOWER EXTREMITY ROM:   grossly WFLs except decreased hip external rotation bil left more limited than right  LOWER EXTREMITY MMT:      MMT Right eval Left eval  Hip flexion 4+ 4+  Hip extension 4+ 4+  Hip abduction 4 4-  Hip adduction    Hip internal rotation    Hip external rotation    Knee flexion    Knee extension 4+ 4+  Ankle dorsiflexion    Ankle plantarflexion 4 4  Ankle inversion    Ankle eversion     (Blank rows = not tested)  LUMBAR SPECIAL TESTS:  Slump test: Negative  FUNCTIONAL TESTS:  Able to rise from the chair without UE assist Able to stoop to pick up small object with ease SLS < 3 sec bilaterally; much less stable on left with pelvic drop 09/12/23: 6 min walk test: 45- age related norm is 1745  GAIT:  Comments: WFLs for short distances   TREATMENT DATE:  09/12/23:  Seated hamstring stretch 3x20 seconds  Seated piriformis stretch 3x20 seconds Knee to chest 3x20 seconds  Trunk rotation 3x20 seconds  6 min walk test-see above  Trigger Point Dry Needling  Initial Treatment: Pt instructed on Dry Needling rational, procedures, and possible side effects. Pt instructed to expect mild to moderate muscle soreness later in the day and/or into the next  day.  Pt instructed in methods to reduce muscle soreness. Pt instructed to continue prescribed HEP. Because Dry Needling was performed over or adjacent to a lung field, pt was educated on S/S of pneumothorax and to seek  immediate medical attention should they occur.  Patient was educated on signs and symptoms of infection and other risk factors and advised to seek medical attention should they occur.  Patient verbalized understanding of these instructions and education.   Patient Verbal Consent Given: Yes Education Handout Provided: Yes Muscles Treated: bil lumbar multifidi and gluteals  Treatment Response/Outcome: twitch response and improved tissue mobility  Elongation and release to bil low back and gluteals  09/11/23                                                                                                                              Eval completed Discussed basic concepts of DN (pt never tried before)  PATIENT EDUCATION:  Education details: Access Code: [email or web address], Dry needling  Person educated: Patient Education method: Explanation Education comprehension: verbalized understanding  HOME EXERCISE PROGRAM: Access Code: [email or web address] URL: [email or web address] Date: 09/12/2023 Prepared by: Tresa Endo  Exercises - Supine Lower Trunk Rotation  - 3 x daily - 7 x weekly - 1 sets - 3 reps - 20 hold - Hooklying Single Knee to Chest  - 3 x daily - 7 x weekly - 1 sets - 3 reps - 20 hold - Seated Hamstring Stretch  - 3 x daily - 7 x weekly - 1 sets - 3 reps - 20 hold - Seated Piriformis Stretch with Trunk Bend  - 3 x daily - 7 x weekly - 1 sets - 3 reps - 20 hold  ASSESSMENT:  CLINICAL IMPRESSION: First time follow-up after evaluation. PT spent session initiating HEP for flexibility.  6 min walk test baseline taken today. Pt agreed to DN and had good response with improved tissue mobility after manual therapy.  Pt walked 1004 feet in 6 minutes and rated pain as 5/10 in hips and calves.  Age related norm for 6 min walk is 1745 feet. Patient will benefit from skilled PT to address the below impairments and improve overall function.   OBJECTIVE IMPAIRMENTS: decreased activity tolerance, difficulty  walking, decreased strength, increased fascial restrictions, impaired perceived functional ability, impaired flexibility, postural dysfunction, and pain.   ACTIVITY LIMITATIONS: carrying, lifting, sleeping, and locomotion level  PARTICIPATION LIMITATIONS: driving, community activity, and occupation  PERSONAL FACTORS: Past/current experiences, Time since onset of injury/illness/exacerbation, and 1-2 comorbidities: HTN; vascular issues   are also affecting patient's functional outcome.   REHAB POTENTIAL: Good  CLINICAL DECISION MAKING: Stable/uncomplicated  EVALUATION COMPLEXITY: Low   GOALS: Goals reviewed with patient? Yes  SHORT TERM GOALS: Target date: 10/09/2023    The patient will demonstrate knowledge of basic self care strategies and exercises to promote healing  Baseline: Goal status: INITIAL  2.  The patient will report a 30% improvement in pain levels with  functional activities which are currently difficult including sleeping and walking Baseline:  Goal status: INITIAL  3.  The patient will have improved trunk flexor and extensor muscle strength to at least 4/5 needed for lifting medium weight objects such as grocery bags, laundry and luggage  Baseline:  Goal status: INITIAL  4.  The patient will have improved left hip strength to at least 4+/5 needed for standing, walking longer distances at home and in the community  Baseline:  Goal status: INITIAL    LONG TERM GOALS: Target date: 11/06/2023    The patient will be independent in a safe self progression of a home and/or gym exercise program to promote further recovery of function   Baseline:  Goal status: INITIAL  2.  The patient will report a 60% improvement in pain levels with functional activities which are currently difficult including sleeping and walking Baseline:  Goal status: INITIAL  3.  The patient will have improved trunk flexor and extensor muscle strength to at least 4+/5 needed for lifting his  45 month old granddaughter Baseline:  Goal status: INITIAL  4.  The patient will be able to walk 900 feet in 6 minutes with pain level <5/10 Baseline: 1007 with 5/10 bil leg pain Goal status: MET  5.  Modified Oswestry score improved to 31% indicating improved function with less pain Baseline:  Goal status: INITIAL   PLAN:  PT FREQUENCY: 2x/week  PT DURATION: 8 weeks  PLANNED INTERVENTIONS: 97164- PT Re-evaluation, 97110-Therapeutic exercises, 97530- Therapeutic activity, 97112- Neuromuscular re-education, 97535- Self Care, 40981- Manual therapy, [id number]- Aquatic Therapy, 97014- Electrical stimulation (unattended), [id number]- Electrical stimulation (manual), [id number]- Ultrasound, [id number]- Traction (mechanical), [id number]- Ionotophoresis 4mg /ml Dexamethasone, Patient/Family education, Taping, Dry Needling, Joint mobilization, Spinal manipulation, Spinal mobilization, Cryotherapy, and Moist heat.  PLAN FOR NEXT SESSION: assess response to DN, review HEP,  try [employer], PT 09/12/23 8:50 AM

## 2023-09-16 ENCOUNTER — Encounter: Payer: Self-pay | Admitting: Cardiology

## 2023-09-16 ENCOUNTER — Ambulatory Visit: Payer: 59 | Attending: Cardiology | Admitting: Cardiology

## 2023-09-16 VITALS — BP 120/82 | HR 87 | Ht 68.0 in | Wt 188.0 lb

## 2023-09-16 DIAGNOSIS — I5022 Chronic systolic (congestive) heart failure: Secondary | ICD-10-CM

## 2023-09-16 DIAGNOSIS — I771 Stricture of artery: Secondary | ICD-10-CM | POA: Diagnosis not present

## 2023-09-16 DIAGNOSIS — I739 Peripheral vascular disease, unspecified: Secondary | ICD-10-CM

## 2023-09-16 DIAGNOSIS — I2510 Atherosclerotic heart disease of native coronary artery without angina pectoris: Secondary | ICD-10-CM | POA: Diagnosis not present

## 2023-09-16 DIAGNOSIS — E785 Hyperlipidemia, unspecified: Secondary | ICD-10-CM

## 2023-09-16 LAB — LIPID PANEL

## 2023-09-16 NOTE — Patient Instructions (Signed)
Medication Instructions:  Continue all medications *If you need a refill on your cardiac medications before your next appointment, please call your pharmacy*   Lab Work: Cbc,A1c,lipid panel today   Testing/Procedures: Echo  first available   Follow-Up: At [facility], you and your health needs are our priority.  As part of our continuing mission to provide you with exceptional heart care, we have created designated Provider Care Teams.  These Care Teams include your primary Cardiologist (physician) and Advanced Practice Providers (APPs -  Physician Assistants and Nurse Practitioners) who all work together to provide you with the care you need, when you need it.  We recommend signing up for the patient portal called "MyChart".  Sign up information is provided on this After Visit Summary.  MyChart is used to connect with patients for Virtual Visits (Telemedicine).  Patients are able to view lab/test results, encounter notes, upcoming appointments, etc.  Non-urgent messages can be sent to your provider as well.   To learn more about what you can do with MyChart, go to [email or web address].    Your next appointment:  6 months   Call  in April to schedule July appointment  ( NEW OFFICE )    Provider:  Dr.Jordan

## 2023-09-17 ENCOUNTER — Telehealth: Payer: Self-pay | Admitting: Cardiology

## 2023-09-17 ENCOUNTER — Ambulatory Visit: Payer: 59 | Admitting: Physical Therapy

## 2023-09-17 DIAGNOSIS — G8929 Other chronic pain: Secondary | ICD-10-CM | POA: Diagnosis not present

## 2023-09-17 DIAGNOSIS — M5432 Sciatica, left side: Secondary | ICD-10-CM

## 2023-09-17 DIAGNOSIS — M6281 Muscle weakness (generalized): Secondary | ICD-10-CM

## 2023-09-17 LAB — HEMOGLOBIN A1C
Est. average glucose Bld gHb Est-mCnc: 111 mg/dL
Hgb A1c MFr Bld: 5.5 % (ref 4.8–5.6)

## 2023-09-17 LAB — CBC WITH DIFFERENTIAL/PLATELET
Basophils Absolute: 0.1 10*3/uL (ref 0.0–0.2)
Basos: 1 %
EOS (ABSOLUTE): 0 10*3/uL (ref 0.0–0.4)
Eos: 0 %
Hematocrit: 43.4 % (ref 37.5–51.0)
Hemoglobin: 14.7 g/dL (ref 13.0–17.7)
Immature Grans (Abs): 0 10*3/uL (ref 0.0–0.1)
Immature Granulocytes: 0 %
Lymphocytes Absolute: 1.9 10*3/uL (ref 0.7–3.1)
Lymphs: 16 %
MCH: 31.5 pg (ref 26.6–33.0)
MCHC: 33.9 g/dL (ref 31.5–35.7)
MCV: 93 fL (ref 79–97)
Monocytes Absolute: 1.3 10*3/uL — ABNORMAL HIGH (ref 0.1–0.9)
Monocytes: 11 %
Neutrophils Absolute: 8.4 10*3/uL — ABNORMAL HIGH (ref 1.4–7.0)
Neutrophils: 72 %
Platelets: 306 10*3/uL (ref 150–450)
RBC: 4.66 x10E6/uL (ref 4.14–5.80)
RDW: 12.7 % (ref 11.6–15.4)
WBC: 11.6 10*3/uL — ABNORMAL HIGH (ref 3.4–10.8)

## 2023-09-17 LAB — LIPID PANEL
Cholesterol, Total: 117 mg/dL (ref 100–199)
HDL: 41 mg/dL (ref >39)
LDL CALC COMMENT:: 2.9 ratio (ref 0.0–5.0)
LDL Chol Calc (NIH): 54 mg/dL (ref 0–99)
Triglycerides: 123 mg/dL (ref 0–149)
VLDL Cholesterol Cal: 22 mg/dL (ref 5–40)

## 2023-09-17 NOTE — Patient Instructions (Signed)

## 2023-09-17 NOTE — Telephone Encounter (Signed)
 Called patient with no answer. Left message to call back

## 2023-09-17 NOTE — Therapy (Signed)
OUTPATIENT PHYSICAL THERAPY  TREATMENT   Patient Name: Martin Archer MRN: [id number] DOB:[birth year], 63 y.o., male Today's Date: 09/17/2023  END OF SESSION:  PT End of Session - 09/17/23 0843     Visit Number 3    Date for PT Re-Evaluation 11/06/23    Authorization Type [employer]HC medicare/Medicaid secondary    Progress Note Due on Visit 10    PT Start Time 0845    PT Stop Time 0925    PT Time Calculation (min) 40 min    Activity Tolerance Patient tolerated treatment well              Past Medical History:  Diagnosis Date   Anemia, unspecified    Arthritis    Diverticulosis of colon (without mention of hemorrhage)    Gastric ulcer with hemorrhage and obstruction 2011   Dr Julien Girt [facility]   GERD (gastroesophageal reflux disease)    hx   Hyperlipemia    Hypertension    Personal history of colonic polyps 01/30/2010   hyperplastic rectal   Sleep apnea    on CPAP; [facility]   Stroke [facility])    mini strokes-bilateral carotid endarterectomy   Substance abuse (HCC)    History of alcohol abuse   Past Surgical History:  Procedure Laterality Date   AORTIC ARCH ANGIOGRAPHY N/A 04/26/2021   Procedure: AORTIC ARCH ANGIOGRAPHY;  Surgeon: Iran Ouch, MD;  Location: [facility] INVASIVE CV LAB;  Service: Cardiovascular;  Laterality: N/A;   CARDIAC CATHETERIZATION  01/25/2021   CAROTID ENDARTERECTOMY  11/2006   left Dr Jerilee Field   COLONOSCOPY  2011   DP-positive FOBT/IDA   ENDARTERECTOMY Right 01/19/2015   Procedure: RIGHT CAROTID ENDARTERECTOMY WITH PATCH ANGIOPLASTY;  Surgeon: Pryor Ochoa, MD;  Location: [facility] OR;  Service: Vascular;  Laterality: Right;   ENDARTERECTOMY Right 06/27/2021   Procedure: RIGHT REDO CAROTID ARTERY ENDARTERECTOMY & PLACEMENT OF 15Fr DRAIN;  Surgeon: Chuck Hint, MD;  Location: [facility] OR;  Service: Vascular;  Laterality: Right;   ESOPHAGOGASTRODUODENOSCOPY Left 06/17/2013   Procedure: ESOPHAGOGASTRODUODENOSCOPY (EGD);   Surgeon: Willis Modena, MD;  Location: [facility] ENDOSCOPY;  Service: Endoscopy;  Laterality: Left;   ESOPHAGOGASTRODUODENOSCOPY N/A 09/20/2015   Procedure: ESOPHAGOGASTRODUODENOSCOPY (EGD);  Surgeon: Meryl Dare, MD;  Location: [facility] ENDOSCOPY;  Service: Endoscopy;  Laterality: N/A;   FOOT SURGERY Bilateral 2020   Taylor's foot   INGUINAL HERNIA REPAIR  1963   NASAL FRACTURE SURGERY  1974   PATCH ANGIOPLASTY Right 06/27/2021   Procedure: PATCH ANGIOPLASTY USING Livia Snellen BIOLOGIC PATCH;  Surgeon: Chuck Hint, MD;  Location: [facility] OR;  Service: Vascular;  Laterality: Right;   PERIPHERAL VASCULAR INTERVENTION  04/26/2021   Procedure: PERIPHERAL VASCULAR INTERVENTION;  Surgeon: Iran Ouch, MD;  Location: [facility] INVASIVE CV LAB;  Service: Cardiovascular;;   RIGHT/LEFT HEART CATH AND CORONARY ANGIOGRAPHY N/A 01/25/2021   Procedure: RIGHT/LEFT HEART CATH AND CORONARY ANGIOGRAPHY;  Surgeon: Corky Crafts, MD;  Location: [facility] INVASIVE CV LAB;  Service: Cardiovascular;  Laterality: N/A;   TEE WITHOUT CARDIOVERSION N/A 01/17/2015   Procedure: TRANSESOPHAGEAL ECHOCARDIOGRAM (TEE);  Surgeon: Thurmon Fair, MD;  Location: [facility] ENDOSCOPY;  Service: Cardiovascular;  Laterality: N/A;   WISDOM TOOTH EXTRACTION     Patient Active Problem List   Diagnosis Date Noted   BMI 33.0-33.9,adult 03/08/2023   Erectile dysfunction 10/03/2022   OSA on CPAP 04/10/2022   Skin lesion of left arm 03/29/2022   Diabetes (HCC) 09/24/2021   Vitamin D deficiency 09/24/2021  Diarrhea 09/05/2021   Viral URI 09/05/2021   Sinusitis 09/05/2021   Carotid artery stenosis 06/27/2021   Intermittent pain and swelling of hand 05/29/2021   Stenosis of left subclavian artery (HCC) 04/26/2021   Chronic systolic heart failure (HCC)    Fatigue 12/19/2020   Dyspnea 12/19/2020   Spondylolisthesis of lumbosacral region 05/03/2020   CKD (chronic kidney disease) stage 4, GFR 15-29 ml/min (HCC) 09/26/2017   Syncope 09/26/2017    Elevated transaminase level 07/25/2017   Cannabis abuse 07/23/2017   Uncomplicated alcohol dependence (HCC) 07/23/2017   Melena    Gastric ulcer with hemorrhage    Right hemiparesis (HCC) 09/18/2015   Chronic blood loss anemia 09/18/2015   Hematemesis 09/18/2015   Encounter for well adult exam with abnormal findings 06/29/2015   History of CEA (carotid endarterectomy) 03/25/2015   HLD (hyperlipidemia) 03/25/2015   Tobacco use disorder 03/25/2015   Carotid artery occlusion with infarction (HCC) 03/25/2015   Carotid aneurysm, 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   Stroke (HCC)    Aortic arch atherosclerosis (HCC)    Carotid stenosis 01/16/2015   Thrombus: BILATERAL EXTERNAL JUGULAR VEINS per CT angio head and neck 01/16/15 01/16/2015   DVT (deep venous thrombosis) (HCC)    Numbness and tingling of left arm and leg 01/15/2015   Cerebral infarction (HCC)    Paresthesia 12/08/2014   Alcohol abuse, in remission 07/14/2013   ADD (attention deficit disorder) 07/14/2013   Nicotine dependence 06/18/2013   Back pain, chronic 06/17/2013   ACUTE GASTRIC ULCER W/HEMORRHAGE AND OBSTRUCTION 02/28/2010   History of colonic polyps 02/28/2010   DIVERTICULOSIS, COLON 02/02/2010   ANEMIA, SEVERE 01/13/2010   Essential hypertension 01/13/2010   Sleep apnea 01/13/2010   Left carotid bruit 01/13/2010   MIXED HEARING LOSS BILATERAL 02/16/2009    PCP: Oliver Barre MD  REFERRING PROVIDER: Clementeen Graham MD  REFERRING DIAG: [phone], G89.29 chronic bilateral low back pain with left sided sciatica; M43.00 pars defect  Rationale for Evaluation and Treatment: Rehabilitation  THERAPY DIAG:  Back pain; weakness  ONSET DATE: > 1 year  SUBJECTIVE:                                                                                                                                                                                           SUBJECTIVE STATEMENT: I have some stomach issues this morning.  My big toe woke me up  pulsating.  It went away eventually.  The DN felt good.  I wasn't stiff this morning.  I've doing the sit down ex's some but not the lay down exercises.  It's more convenient.       Lost  20# taking injections I play golf but I haven't swung a club in the last few months 2-3 blocks max walk   Has a gym where he lives but does not use  PERTINENT HISTORY:  L5- S1 ESI 12/11; prior ESI "several" See cardiac history above HTN Vascular issues (may need stint in leg) Numbness in both feet On blood thinners Foot surgery 85 month old Grandtr in [place]  PAIN: 09/12/23  Are you having pain? Yes NPRS scale: 2-3/10 (stiffness) Pain location: bil low back pain and left LE especially knee to ankle; numbness in both feet Pain orientation: Bilateral  PAIN TYPE: aching Pain description: constant  Aggravating factors: my mattress (waken with low back pain); sleeping, walking Relieving factors: fetal position usually left side; sitting (sometimes relief, sometimes not)  PRECAUTIONS: None    WEIGHT BEARING RESTRICTIONS: No  FALLS:  Has patient fallen in last 6 months? No  OCCUPATION: part-time work 4-6 hours standing   PLOF: Independent with basic ADLs  PATIENT GOALS: I hope to loosen things up; maybe play golf; how can I alleviate the pain?;  go to the gym where I live  OBJECTIVE:  Note: Objective measures were completed at Evaluation unless otherwise noted.  DIAGNOSTIC FINDINGS:  MRI Nov '24 IMPRESSION: 1. 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 There is chronic severe foraminal narrowing bilaterally and probable chronic bilateral L5 nerve root encroachment. 2. Mild spondylosis at the other lumbar levels without significant spinal stenosis or nerve root encroachment. 3. No acute osseous findings.  PATIENT SURVEYS:  Modified Oswestry 40% pt left glasses in the car so questionnaire done verbally and was time  consuming   COGNITION: Overall cognitive status: Within functional limits for tasks assessed     MUSCLE LENGTH: Moderate decrease in HS and hip flexor lengths bilaterally POSTURE: decreased lumbar lordosis  PALPATION: Decreased fascial mobility in lumbar region bil  LUMBAR ROM:   AROM eval  Flexion 6 inches from floor  Extension 10 degrees  Right lateral flexion 20 degrees  Left lateral flexion 20 degrees  Right rotation   Left rotation    (Blank rows = not tested) TRUNK STRENGTH:  Decreased activation of transverse abdominus muscles; abdominals 4-/5; decreased activation of lumbar multifidi; trunk extensors 4-/5  LOWER EXTREMITY ROM:   grossly WFLs except decreased hip external rotation bil left more limited than right  LOWER EXTREMITY MMT:      MMT Right eval Left eval  Hip flexion 4+ 4+  Hip extension 4+ 4+  Hip abduction 4 4-  Hip adduction    Hip internal rotation    Hip external rotation    Knee flexion    Knee extension 4+ 4+  Ankle dorsiflexion    Ankle plantarflexion 4 4  Ankle inversion    Ankle eversion     (Blank rows = not tested)  LUMBAR SPECIAL TESTS:  Slump test: Negative  FUNCTIONAL TESTS:  Able to rise from the chair without UE assist Able to stoop to pick up small object with ease SLS < 3 sec bilaterally; much less stable on left with pelvic drop 09/12/23: 6 min walk test: 56- age related norm is 1745  GAIT:  Comments: WFLs for short distances   TREATMENT DATE:  08/29/23:  Nu-Step L4 new model 3 min 2nd step hip flexor stretch 3 sets of 5 with UE reaches up and overhead right/left 2nd step HS 5x right/left Seated 5# Kettlebell chops to simulate golf swing  10x right/left  Standing holding 5# weight farmer's hold with BOSU taps 10x right/left Home TENS info given Trigger Point Dry Needling Subsequent Treatment: Instructions reviewed, if requested by the patient, prior to subsequent dry needling treatment.   Patient Verbal Consent  Given: Yes Education Handout Provided: Previously Provided Muscles Treated: bil lumbar multifidi and gluteals  Electrical Stimulation Performed: Yes, Parameters: 1.5 ma 80 pps 8 min to bil lumbar multifidi and gluteals Treatment Response/Outcome: twitch response and improved tissue mobility  Elongation and release to bil low back and gluteals   Trigger Point Dry Needling  Initial Treatment: Pt instructed on Dry Needling rational, procedures, and possible side effects. Pt instructed to expect mild to moderate muscle soreness later in the day and/or into the next day.  Pt instructed in methods to reduce muscle soreness. Pt instructed to continue prescribed HEP. Because Dry Needling was performed over or adjacent to a lung field, pt was educated on S/S of pneumothorax and to seek immediate medical attention should they occur.  Patient was educated on signs and symptoms of infection and other risk factors and advised to seek medical attention should they occur.  Patient verbalized understanding of these instructions and education.   Patient Verbal Consent Given: Yes Education Handout Provided: Yes Muscles Treated: bil lumbar multifidi and gluteals  Treatment Response/Outcome: twitch response and improved tissue mobility  Elongation and release to bil low back and gluteals 09/12/23:  Seated hamstring stretch 3x20 seconds  Seated piriformis stretch 3x20 seconds Knee to chest 3x20 seconds  Trunk rotation 3x20 seconds  6 min walk test-see above  Trigger Point Dry Needling  Initial Treatment: Pt instructed on Dry Needling rational, procedures, and possible side effects. Pt instructed to expect mild to moderate muscle soreness later in the day and/or into the next day.  Pt instructed in methods to reduce muscle soreness. Pt instructed to continue prescribed HEP. Because Dry Needling was performed over or adjacent to a lung field, pt was educated on S/S of pneumothorax and to seek immediate  medical attention should they occur.  Patient was educated on signs and symptoms of infection and other risk factors and advised to seek medical attention should they occur.  Patient verbalized understanding of these instructions and education.   Patient Verbal Consent Given: Yes Education Handout Provided: Yes Muscles Treated: bil lumbar multifidi and gluteals  Treatment Response/Outcome: twitch response and improved tissue mobility  Elongation and release to bil low back and gluteals  09/11/23                                                                                                                              Eval completed Discussed basic concepts of DN (pt never tried before)  PATIENT EDUCATION:  Education details: Access Code: [email or web address], Dry needling; home TENS info Person educated: Patient Education method: Explanation Education comprehension: verbalized understanding  HOME EXERCISE PROGRAM: Access Code: [email or web address] URL: [email or web address] Date: 09/12/2023 Prepared by: Tresa Endo  Exercises -  Supine Lower Trunk Rotation  - 3 x daily - 7 x weekly - 1 sets - 3 reps - 20 hold - Hooklying Single Knee to Chest  - 3 x daily - 7 x weekly - 1 sets - 3 reps - 20 hold - Seated Hamstring Stretch  - 3 x daily - 7 x weekly - 1 sets - 3 reps - 20 hold - Seated Piriformis Stretch with Trunk Bend  - 3 x daily - 7 x weekly - 1 sets - 3 reps - 20 hold  ASSESSMENT:  CLINICAL IMPRESSION: Pt states he appreciates the slower progression of rehab. Good response to trunk mobility and with the initiation of seated and standing core strengthening.  Electrical stimulation added to indwelling needles for further impacts on pain relief and neuromuscular changes.  Information provided on home TENS for pain control.  Therapist monitoring response to all interventions and modifying treatment accordingly.     OBJECTIVE IMPAIRMENTS: decreased activity tolerance, difficulty walking, decreased  strength, increased fascial restrictions, impaired perceived functional ability, impaired flexibility, postural dysfunction, and pain.   ACTIVITY LIMITATIONS: carrying, lifting, sleeping, and locomotion level  PARTICIPATION LIMITATIONS: driving, community activity, and occupation  PERSONAL FACTORS: Past/current experiences, Time since onset of injury/illness/exacerbation, and 1-2 comorbidities: HTN; vascular issues   are also affecting patient's functional outcome.   REHAB POTENTIAL: Good  CLINICAL DECISION MAKING: Stable/uncomplicated  EVALUATION COMPLEXITY: Low   GOALS: Goals reviewed with patient? Yes  SHORT TERM GOALS: Target date: 10/09/2023    The patient will demonstrate knowledge of basic self care strategies and exercises to promote healing  Baseline: Goal status: INITIAL  2.  The patient will report a 30% improvement in pain levels with functional activities which are currently difficult including sleeping and walking Baseline:  Goal status: INITIAL  3.  The patient will have improved trunk flexor and extensor muscle strength to at least 4/5 needed for lifting medium weight objects such as grocery bags, laundry and luggage  Baseline:  Goal status: INITIAL  4.  The patient will have improved left hip strength to at least 4+/5 needed for standing, walking longer distances at home and in the community  Baseline:  Goal status: INITIAL    LONG TERM GOALS: Target date: 11/06/2023    The patient will be independent in a safe self progression of a home and/or gym exercise program to promote further recovery of function   Baseline:  Goal status: INITIAL  2.  The patient will report a 60% improvement in pain levels with functional activities which are currently difficult including sleeping and walking Baseline:  Goal status: INITIAL  3.  The patient will have improved trunk flexor and extensor muscle strength to at least 4+/5 needed for lifting his 76 month old  granddaughter Baseline:  Goal status: INITIAL  4.  The patient will be able to walk 900 feet in 6 minutes with pain level <5/10 Baseline: 1007 with 5/10 bil leg pain Goal status: MET  5.  Modified Oswestry score improved to 31% indicating improved function with less pain Baseline:  Goal status: INITIAL   PLAN:  PT FREQUENCY: 2x/week  PT DURATION: 8 weeks  PLANNED INTERVENTIONS: 97164- PT Re-evaluation, 97110-Therapeutic exercises, 97530- Therapeutic activity, 97112- Neuromuscular re-education, 97535- Self Care, 47829- Manual therapy, [id number]- Aquatic Therapy, 97014- Electrical stimulation (unattended), [id number]- Electrical stimulation (manual), [id number]- Ultrasound, [id number]- Traction (mechanical), [id number]- Ionotophoresis 4mg /ml Dexamethasone, Patient/Family education, Taping, Dry Needling, Joint mobilization, Spinal manipulation, Spinal mobilization, Cryotherapy, and Moist heat.  PLAN FOR  NEXT SESSION: assess response to DN combined with ES, add to HEP,   Lita Mains, PT 09/17/23 9:38 AM Phone: [phone] Fax: [phone]

## 2023-09-17 NOTE — Telephone Encounter (Signed)
Pt would like a call back to discuss lab results

## 2023-09-18 ENCOUNTER — Telehealth: Payer: Self-pay

## 2023-09-18 NOTE — Telephone Encounter (Signed)
Called patient left message on personal voice mail Dr.Jordan reviewed recent lab results normal.

## 2023-09-18 NOTE — Telephone Encounter (Signed)
Called and spoke to patient. Verified name and DOB. He is calling to get lab results. Informed him results are back and I would send a message to Dr [place] to review/

## 2023-09-18 NOTE — Telephone Encounter (Signed)
Spoke to patient he stated he did receive message labs normal.

## 2023-09-19 ENCOUNTER — Ambulatory Visit: Payer: 59 | Admitting: Physical Therapy

## 2023-09-20 ENCOUNTER — Ambulatory Visit: Payer: 59 | Admitting: Physical Therapy

## 2023-09-20 DIAGNOSIS — G8929 Other chronic pain: Secondary | ICD-10-CM | POA: Diagnosis not present

## 2023-09-20 DIAGNOSIS — M5432 Sciatica, left side: Secondary | ICD-10-CM

## 2023-09-20 DIAGNOSIS — M6281 Muscle weakness (generalized): Secondary | ICD-10-CM

## 2023-09-20 NOTE — Therapy (Signed)
OUTPATIENT PHYSICAL THERAPY  TREATMENT   Patient Name: Martin Archer MRN: [id number] DOB:03/31/1961, 63 y.o., male Today's Date: 09/20/2023  END OF SESSION:  PT End of Session - 09/20/23 0844     Visit Number 4    Date for PT Re-Evaluation 11/06/23    Authorization Type [employer] medicare/Medicaid secondary    Progress Note Due on Visit 10    PT Start Time 0842    PT Stop Time 0925    PT Time Calculation (min) 43 min    Activity Tolerance Patient tolerated treatment well              Past Medical History:  Diagnosis Date   Anemia, unspecified    Arthritis    Diverticulosis of colon (without mention of hemorrhage)    Gastric ulcer with hemorrhage and obstruction 2011   Dr Julien Girt [facility]   GERD (gastroesophageal reflux disease)    hx   Hyperlipemia    Hypertension    Personal history of colonic polyps 01/30/2010   hyperplastic rectal   Sleep apnea    on CPAP; [facility]   Stroke [facility])    mini strokes-bilateral carotid endarterectomy   Substance abuse (HCC)    History of alcohol abuse   Past Surgical History:  Procedure Laterality Date   AORTIC ARCH ANGIOGRAPHY N/A 04/26/2021   Procedure: AORTIC ARCH ANGIOGRAPHY;  Surgeon: Iran Ouch, MD;  Location: [facility] INVASIVE CV LAB;  Service: Cardiovascular;  Laterality: N/A;   CARDIAC CATHETERIZATION  01/25/2021   CAROTID ENDARTERECTOMY  11/2006   left Dr Jerilee Field   COLONOSCOPY  2011   DP-positive FOBT/IDA   ENDARTERECTOMY Right 01/19/2015   Procedure: RIGHT CAROTID ENDARTERECTOMY WITH PATCH ANGIOPLASTY;  Surgeon: Pryor Ochoa, MD;  Location: [facility] OR;  Service: Vascular;  Laterality: Right;   ENDARTERECTOMY Right 06/27/2021   Procedure: RIGHT REDO CAROTID ARTERY ENDARTERECTOMY & PLACEMENT OF 15Fr DRAIN;  Surgeon: Chuck Hint, MD;  Location: [facility] OR;  Service: Vascular;  Laterality: Right;   ESOPHAGOGASTRODUODENOSCOPY Left 06/17/2013   Procedure: ESOPHAGOGASTRODUODENOSCOPY (EGD);   Surgeon: Willis Modena, MD;  Location: [facility] ENDOSCOPY;  Service: Endoscopy;  Laterality: Left;   ESOPHAGOGASTRODUODENOSCOPY N/A 09/20/2015   Procedure: ESOPHAGOGASTRODUODENOSCOPY (EGD);  Surgeon: Meryl Dare, MD;  Location: [facility] ENDOSCOPY;  Service: Endoscopy;  Laterality: N/A;   FOOT SURGERY Bilateral 2020   Taylor's foot   INGUINAL HERNIA REPAIR  1963   NASAL FRACTURE SURGERY  1974   PATCH ANGIOPLASTY Right 06/27/2021   Procedure: PATCH ANGIOPLASTY USING Livia Snellen BIOLOGIC PATCH;  Surgeon: Chuck Hint, MD;  Location: [facility] OR;  Service: Vascular;  Laterality: Right;   PERIPHERAL VASCULAR INTERVENTION  04/26/2021   Procedure: PERIPHERAL VASCULAR INTERVENTION;  Surgeon: Iran Ouch, MD;  Location: [facility] INVASIVE CV LAB;  Service: Cardiovascular;;   RIGHT/LEFT HEART CATH AND CORONARY ANGIOGRAPHY N/A 01/25/2021   Procedure: RIGHT/LEFT HEART CATH AND CORONARY ANGIOGRAPHY;  Surgeon: Corky Crafts, MD;  Location: [facility] INVASIVE CV LAB;  Service: Cardiovascular;  Laterality: N/A;   TEE WITHOUT CARDIOVERSION N/A 01/17/2015   Procedure: TRANSESOPHAGEAL ECHOCARDIOGRAM (TEE);  Surgeon: Thurmon Fair, MD;  Location: [facility] ENDOSCOPY;  Service: Cardiovascular;  Laterality: N/A;   WISDOM TOOTH EXTRACTION     Patient Active Problem List   Diagnosis Date Noted   BMI 33.0-33.9,adult 03/08/2023   Erectile dysfunction 10/03/2022   OSA on CPAP 04/10/2022   Skin lesion of left arm 03/29/2022   Diabetes (HCC) 09/24/2021   Vitamin D deficiency 09/24/2021  Diarrhea 09/05/2021   Viral URI 09/05/2021   Sinusitis 09/05/2021   Carotid artery stenosis 06/27/2021   Intermittent pain and swelling of hand 05/29/2021   Stenosis of left subclavian artery (HCC) 04/26/2021   Chronic systolic heart failure (HCC)    Fatigue 12/19/2020   Dyspnea 12/19/2020   Spondylolisthesis of lumbosacral region 05/03/2020   CKD (chronic kidney disease) stage 4, GFR 15-29 ml/min (HCC) 09/26/2017   Syncope 09/26/2017    Elevated transaminase level 07/25/2017   Cannabis abuse 07/23/2017   Uncomplicated alcohol dependence (HCC) 07/23/2017   Melena    Gastric ulcer with hemorrhage    Right hemiparesis (HCC) 09/18/2015   Chronic blood loss anemia 09/18/2015   Hematemesis 09/18/2015   Encounter for well adult exam with abnormal findings 06/29/2015   History of CEA (carotid endarterectomy) 03/25/2015   HLD (hyperlipidemia) 03/25/2015   Tobacco use disorder 03/25/2015   Carotid artery occlusion with infarction (HCC) 03/25/2015   Carotid aneurysm, 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   Stroke (HCC)    Aortic arch atherosclerosis (HCC)    Carotid stenosis 01/16/2015   Thrombus: BILATERAL EXTERNAL JUGULAR VEINS per CT angio head and neck 01/16/15 01/16/2015   DVT (deep venous thrombosis) (HCC)    Numbness and tingling of left arm and leg 01/15/2015   Cerebral infarction (HCC)    Paresthesia 12/08/2014   Alcohol abuse, in remission 07/14/2013   ADD (attention deficit disorder) 07/14/2013   Nicotine dependence 06/18/2013   Back pain, chronic 06/17/2013   ACUTE GASTRIC ULCER W/HEMORRHAGE AND OBSTRUCTION 02/28/2010   History of colonic polyps 02/28/2010   DIVERTICULOSIS, COLON 02/02/2010   ANEMIA, SEVERE 01/13/2010   Essential hypertension 01/13/2010   Sleep apnea 01/13/2010   Left carotid bruit 01/13/2010   MIXED HEARING LOSS BILATERAL 02/16/2009    PCP: Oliver Barre MD  REFERRING PROVIDER: Clementeen Graham MD  REFERRING DIAG: [id number], G89.29 chronic bilateral low back pain with left sided sciatica; M43.00 pars defect  Rationale for Evaluation and Treatment: Rehabilitation  THERAPY DIAG:  Back pain; weakness  ONSET DATE: > 1 year  SUBJECTIVE:                                                                                                                                                                                           SUBJECTIVE STATEMENT: I feel good!  I noticed getting out of the car it was better.  I took  some golf swings the other day.   Lost 20# taking injections I play golf but I haven't swung a club in the last few months 2-3 blocks max walk   Has a gym where he lives but  does not use  PERTINENT HISTORY:  L5- S1 ESI 12/11; prior ESI "several" See cardiac history above HTN Vascular issues (may need stint in leg) Numbness in both feet On blood thinners Foot surgery 53 month old Grandtr in [place]  PAIN:   Are you having pain? no NPRS scale: 0/10  Pain location: bil low back pain and left LE especially knee to ankle; numbness in both feet Pain orientation: Bilateral  PAIN TYPE: aching Pain description: constant  Aggravating factors: my mattress (waken with low back pain); sleeping, walking Relieving factors: fetal position usually left side; sitting (sometimes relief, sometimes not)  PRECAUTIONS: None    WEIGHT BEARING RESTRICTIONS: No  FALLS:  Has patient fallen in last 6 months? No  OCCUPATION: part-time work 4-6 hours standing   PLOF: Independent with basic ADLs  PATIENT GOALS: I hope to loosen things up; maybe play golf; how can I alleviate the pain?;  go to the gym where I live  OBJECTIVE:  Note: Objective measures were completed at Evaluation unless otherwise noted.  DIAGNOSTIC FINDINGS:  MRI Nov '24 IMPRESSION: 1. 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 There is chronic severe foraminal narrowing bilaterally and probable chronic bilateral L5 nerve root encroachment. 2. Mild spondylosis at the other lumbar levels without significant spinal stenosis or nerve root encroachment. 3. No acute osseous findings.  PATIENT SURVEYS:  Modified Oswestry 40% pt left glasses in the car so questionnaire done verbally and was time consuming   COGNITION: Overall cognitive status: Within functional limits for tasks assessed     MUSCLE LENGTH: Moderate decrease in HS and hip flexor lengths  bilaterally POSTURE: decreased lumbar lordosis  PALPATION: Decreased fascial mobility in lumbar region bil  LUMBAR ROM:   AROM eval  Flexion 6 inches from floor  Extension 10 degrees  Right lateral flexion 20 degrees  Left lateral flexion 20 degrees  Right rotation   Left rotation    (Blank rows = not tested) TRUNK STRENGTH:  Decreased activation of transverse abdominus muscles; abdominals 4-/5; decreased activation of lumbar multifidi; trunk extensors 4-/5  LOWER EXTREMITY ROM:   grossly WFLs except decreased hip external rotation bil left more limited than right  LOWER EXTREMITY MMT:      MMT Right eval Left eval  Hip flexion 4+ 4+  Hip extension 4+ 4+  Hip abduction 4 4-  Hip adduction    Hip internal rotation    Hip external rotation    Knee flexion    Knee extension 4+ 4+  Ankle dorsiflexion    Ankle plantarflexion 4 4  Ankle inversion    Ankle eversion     (Blank rows = not tested)  LUMBAR SPECIAL TESTS:  Slump test: Negative  FUNCTIONAL TESTS:  Able to rise from the chair without UE assist Able to stoop to pick up small object with ease SLS < 3 sec bilaterally; much less stable on left with pelvic drop 09/12/23: 6 min walk test: 42- age related norm is 1745  GAIT:  Comments: WFLs for short distances   TREATMENT DATE:  09/20/23:  Nu-Step L5 new model 5 min 2nd step hip flexor stretch 3 sets of 5 with UE reaches up and overhead right/left 2nd step HS 5x right/left Standing core strengthening series holding pair of 5 pound dumbbells while marching sets of 8 reps each:  1) Farmers hold; 2) single at the shoulder hold; 3) single overhead press hold  Seated trunk extension red power  cord 25x Sit to stand holding 5# 8x; holding both 5# dumbbells at the shoulder 8x Standing bil 5# dumbbells modified hip hinge 10x (verbal cues for forward gaze) Quadruped rock with crossed ankles and biased angles 10x Open books 10x right/left with foam roll  08/29/23:   Nu-Step L4 new model 3 min 2nd step hip flexor stretch 3 sets of 5 with UE reaches up and overhead right/left 2nd step HS 5x right/left Seated 5# Kettlebell chops to simulate golf swing 10x right/left  Standing holding 5# weight farmer's hold with BOSU taps 10x right/left Home TENS info given Trigger Point Dry Needling Subsequent Treatment: Instructions reviewed, if requested by the patient, prior to subsequent dry needling treatment.   Patient Verbal Consent Given: Yes Education Handout Provided: Previously Provided Muscles Treated: bil lumbar multifidi and gluteals  Electrical Stimulation Performed: Yes, Parameters: 1.5 ma 80 pps 8 min to bil lumbar multifidi and gluteals Treatment Response/Outcome: twitch response and improved tissue mobility  Elongation and release to bil low back and gluteals   Trigger Point Dry Needling  Initial Treatment: Pt instructed on Dry Needling rational, procedures, and possible side effects. Pt instructed to expect mild to moderate muscle soreness later in the day and/or into the next day.  Pt instructed in methods to reduce muscle soreness. Pt instructed to continue prescribed HEP. Because Dry Needling was performed over or adjacent to a lung field, pt was educated on S/S of pneumothorax and to seek immediate medical attention should they occur.  Patient was educated on signs and symptoms of infection and other risk factors and advised to seek medical attention should they occur.  Patient verbalized understanding of these instructions and education.   Patient Verbal Consent Given: Yes Education Handout Provided: Yes Muscles Treated: bil lumbar multifidi and gluteals  Treatment Response/Outcome: twitch response and improved tissue mobility  Elongation and release to bil low back and gluteals 09/12/23:  Seated hamstring stretch 3x20 seconds  Seated piriformis stretch 3x20 seconds Knee to chest 3x20 seconds  Trunk rotation 3x20 seconds  6 min walk  test-see above  Trigger Point Dry Needling  Initial Treatment: Pt instructed on Dry Needling rational, procedures, and possible side effects. Pt instructed to expect mild to moderate muscle soreness later in the day and/or into the next day.  Pt instructed in methods to reduce muscle soreness. Pt instructed to continue prescribed HEP. Because Dry Needling was performed over or adjacent to a lung field, pt was educated on S/S of pneumothorax and to seek immediate medical attention should they occur.  Patient was educated on signs and symptoms of infection and other risk factors and advised to seek medical attention should they occur.  Patient verbalized understanding of these instructions and education.   Patient Verbal Consent Given: Yes Education Handout Provided: Yes Muscles Treated: bil lumbar multifidi and gluteals  Treatment Response/Outcome: twitch response and improved tissue mobility  Elongation and release to bil low back and gluteals  09/11/23  Eval completed Discussed basic concepts of DN (pt never tried before)  PATIENT EDUCATION:  Education details: Access Code: [email or web address], Dry needling; home TENS info Person educated: Patient Education method: Explanation Education comprehension: verbalized understanding  HOME EXERCISE PROGRAM: Access Code: [email or web address] URL: [email or web address] Date: 09/20/2023 Prepared by: Lavinia Sharps  Exercises - Supine Lower Trunk Rotation  - 3 x daily - 7 x weekly - 1 sets - 3 reps - 20 hold - Hooklying Single Knee to Chest  - 3 x daily - 7 x weekly - 1 sets - 3 reps - 20 hold - Seated Hamstring Stretch  - 3 x daily - 7 x weekly - 1 sets - 3 reps - 20 hold - Seated Piriformis Stretch with Trunk Bend  - 3 x daily - 7 x weekly - 1 sets - 3 reps - 20 hold - Sit to Stand with Arms Crossed  - 1 x daily - 7 x weekly -  1 sets - 10 reps - Standing Hamstring Stretch with Step  - 1 x daily - 7 x weekly - 1 sets - 5 reps - Standing Knee Flexion Stretch on Step  - 1 x daily - 7 x weekly - 1 sets - 5 reps - Half Deadlift with Kettlebell  - 1 x daily - 7 x weekly - 1 sets - 10 reps - Quadruped Rocking Backward  - 1 x daily - 7 x weekly - 1 sets - 10 reps - Sidelying Open Book Thoracic Rotation with Knee on Foam Roll  - 1 x daily - 7 x weekly - 1 sets - 10 reps  ASSESSMENT:  CLINICAL IMPRESSION: Updated HEP to included trunk mobility ex's and progressive lumbo/pelvic strength ex's.  Instruction given in hip hinging /dead lift technique needed for safe lifting capacity of household items and for posterior chain strengthening of lumbar and lower extremity musculature.  Verbal cues to keep the weight close to the body, hip and shoulders to rise together and for forward gaze needed for neutral spine alignment.   No pain reported during treatment session.     OBJECTIVE IMPAIRMENTS: decreased activity tolerance, difficulty walking, decreased strength, increased fascial restrictions, impaired perceived functional ability, impaired flexibility, postural dysfunction, and pain.   ACTIVITY LIMITATIONS: carrying, lifting, sleeping, and locomotion level  PARTICIPATION LIMITATIONS: driving, community activity, and occupation  PERSONAL FACTORS: Past/current experiences, Time since onset of injury/illness/exacerbation, and 1-2 comorbidities: HTN; vascular issues   are also affecting patient's functional outcome.   REHAB POTENTIAL: Good  CLINICAL DECISION MAKING: Stable/uncomplicated  EVALUATION COMPLEXITY: Low   GOALS: Goals reviewed with patient? Yes  SHORT TERM GOALS: Target date: 10/09/2023    The patient will demonstrate knowledge of basic self care strategies and exercises to promote healing  Baseline: Goal status: INITIAL  2.  The patient will report a 30% improvement in pain levels with functional activities  which are currently difficult including sleeping and walking Baseline:  Goal status: INITIAL  3.  The patient will have improved trunk flexor and extensor muscle strength to at least 4/5 needed for lifting medium weight objects such as grocery bags, laundry and luggage  Baseline:  Goal status: INITIAL  4.  The patient will have improved left hip strength to at least 4+/5 needed for standing, walking longer distances at home and in the community  Baseline:  Goal status: INITIAL    LONG TERM GOALS: Target date: 11/06/2023    The patient will be independent in a safe self progression of a  home and/or gym exercise program to promote further recovery of function   Baseline:  Goal status: INITIAL  2.  The patient will report a 60% improvement in pain levels with functional activities which are currently difficult including sleeping and walking Baseline:  Goal status: INITIAL  3.  The patient will have improved trunk flexor and extensor muscle strength to at least 4+/5 needed for lifting his 23 month old granddaughter Baseline:  Goal status: INITIAL  4.  The patient will be able to walk 900 feet in 6 minutes with pain level <5/10 Baseline: 1007 with 5/10 bil leg pain Goal status: MET  5.  Modified Oswestry score improved to 31% indicating improved function with less pain Baseline:  Goal status: INITIAL   PLAN:  PT FREQUENCY: 2x/week  PT DURATION: 8 weeks  PLANNED INTERVENTIONS: 97164- PT Re-evaluation, 97110-Therapeutic exercises, 97530- Therapeutic activity, 97112- Neuromuscular re-education, 97535- Self Care, 16109- Manual therapy, [id number]- Aquatic Therapy, 97014- Electrical stimulation (unattended), [id number]- Electrical stimulation (manual), [id number]- Ultrasound, [id number]- Traction (mechanical), [id number]- Ionotophoresis 4mg /ml Dexamethasone, Patient/Family education, Taping, Dry Needling, Joint mobilization, Spinal manipulation, Spinal mobilization, Cryotherapy, and Moist heat.  PLAN FOR  NEXT SESSION:  DN combined with ES as needed,  Nustep; core strengthening; hip hinge; squats, trunk mobility ex's for golf  Lavinia Sharps, PT 09/20/23 9:38 AM Phone: [phone] Fax: [phone]

## 2023-09-24 ENCOUNTER — Encounter: Payer: Self-pay | Admitting: Cardiovascular Disease

## 2023-09-24 ENCOUNTER — Ambulatory Visit: Payer: 59

## 2023-09-24 ENCOUNTER — Ambulatory Visit: Payer: 59 | Attending: Cardiovascular Disease | Admitting: Cardiovascular Disease

## 2023-09-24 VITALS — BP 92/68 | HR 94 | Ht 68.0 in | Wt 185.0 lb

## 2023-09-24 DIAGNOSIS — M6281 Muscle weakness (generalized): Secondary | ICD-10-CM

## 2023-09-24 DIAGNOSIS — I771 Stricture of artery: Secondary | ICD-10-CM

## 2023-09-24 DIAGNOSIS — I5022 Chronic systolic (congestive) heart failure: Secondary | ICD-10-CM

## 2023-09-24 DIAGNOSIS — I714 Abdominal aortic aneurysm, without rupture, unspecified: Secondary | ICD-10-CM | POA: Diagnosis not present

## 2023-09-24 DIAGNOSIS — M5432 Sciatica, left side: Secondary | ICD-10-CM

## 2023-09-24 DIAGNOSIS — I10 Essential (primary) hypertension: Secondary | ICD-10-CM

## 2023-09-24 DIAGNOSIS — I779 Disorder of arteries and arterioles, unspecified: Secondary | ICD-10-CM

## 2023-09-24 DIAGNOSIS — I739 Peripheral vascular disease, unspecified: Secondary | ICD-10-CM

## 2023-09-24 DIAGNOSIS — G8929 Other chronic pain: Secondary | ICD-10-CM | POA: Diagnosis not present

## 2023-09-24 MED ORDER — AMLODIPINE BESYLATE 5 MG PO TABS: 5.0000 mg | ORAL_TABLET | Freq: Every day | ORAL | 4 refills | Status: AC

## 2023-09-24 NOTE — Progress Notes (Signed)
Cardiology Office Note   Date:  09/24/2023   ID:  Martin Archer, DOB 03/04/1961, MRN [id number]  PCP:  Corwin Levins, MD  Cardiologist: Dr. [place]  Chief Complaint  Patient presents with   Follow-up    1 year.       History of Present Illness: Martin Archer is a 63 y.o. 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   He has known history of essential hypertension, previous tobacco use, carotid artery disease status post bilateral endarterectomy, chronic systolic heart failure, history of anemia due to GI bleed and chronic kidney disease. He is not diabetic and quit smoking in 2021. He had an echocardiogram done in April of 2022 which showed an ejection fraction of 40 to 45% with global hypokinesis, severely dilated atria, mild mitral regurgitation mildly calcified aortic valve.  He underwent a right and left cardiac catheterization in June of 2022 which showed chronically occluded proximal right coronary artery with left-to-right collaterals and mild nonobstructive disease on the left side.  Medical therapy was recommended.  Carotid Doppler done in May of 2022 showed an occluded left carotid artery with moderate disease on the right side.  There was significant stenosis noted in the left subclavian artery with 37 mm of pressure difference between the arms and retrograde flow in the left vertebral artery.  The patient had highly symptomatic left arm claudication.  I proceeded with angiography in August of 2022 which showed severe left subclavian artery stenosis.  I performed successful VBX covered stent placement to the left subclavian artery.  In addition, he was noted to have flush occlusion of the right SFA.  Lower extremity Dopplers in August of 2022 showed an ABI of 0.62 on the right and 0.84 on the left.  Duplex showed borderline stenosis in the right external iliac  artery with occluded right SFA.  On the left, there was moderate SFA disease.  Upon follow-up carotid Doppler, the patient was noted to have patent left subclavian artery stent with chronically occluded left carotid artery and progression of right carotid stenosis to greater than 80%.  The patient underwent redo carotid endarterectomy in November of 2022 by Dr. Edilia Bo.  Renal artery duplex in February 2023 showed no evidence of renal artery stenosis.  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  He has been doing reasonably well with no chest pain or worsening dyspnea.  He reports mild bilateral leg claudication which is equal on both sides.  He is slightly hypertensive but denies dizziness.  Past Medical History:  Diagnosis Date   Anemia, unspecified    Arthritis    Diverticulosis of colon (without mention of hemorrhage)    Gastric ulcer with hemorrhage and obstruction 2011   Dr Julien Girt [facility]   GERD (gastroesophageal reflux disease)    hx   Hyperlipemia    Hypertension    Personal history of colonic polyps 01/30/2010   hyperplastic rectal   Sleep apnea    on CPAP; [facility]   Stroke [facility])    mini strokes-bilateral carotid endarterectomy   Substance abuse (HCC)    History of alcohol abuse    Past Surgical History:  Procedure Laterality Date   AORTIC ARCH ANGIOGRAPHY N/A 04/26/2021   Procedure: AORTIC ARCH ANGIOGRAPHY;  Surgeon: Iran Ouch, MD;  Location: [facility] INVASIVE CV LAB;  Service: Cardiovascular;  Laterality: N/A;   CARDIAC  CATHETERIZATION  01/25/2021   CAROTID ENDARTERECTOMY  11/2006   left Dr Jerilee Field   COLONOSCOPY  2011   DP-positive FOBT/IDA   ENDARTERECTOMY Right 01/19/2015   Procedure: RIGHT CAROTID ENDARTERECTOMY WITH PATCH ANGIOPLASTY;  Surgeon: Pryor Ochoa, MD;  Location: [facility] OR;  Service: Vascular;  Laterality: Right;   ENDARTERECTOMY Right 06/27/2021   Procedure: RIGHT REDO CAROTID ARTERY ENDARTERECTOMY  & PLACEMENT OF 15Fr DRAIN;  Surgeon: Chuck Hint, MD;  Location: [facility] OR;  Service: Vascular;  Laterality: Right;   ESOPHAGOGASTRODUODENOSCOPY Left 06/17/2013   Procedure: ESOPHAGOGASTRODUODENOSCOPY (EGD);  Surgeon: Willis Modena, MD;  Location: [facility] ENDOSCOPY;  Service: Endoscopy;  Laterality: Left;   ESOPHAGOGASTRODUODENOSCOPY N/A 09/20/2015   Procedure: ESOPHAGOGASTRODUODENOSCOPY (EGD);  Surgeon: Meryl Dare, MD;  Location: [facility] ENDOSCOPY;  Service: Endoscopy;  Laterality: N/A;   FOOT SURGERY Bilateral 2020   Taylor's foot   INGUINAL HERNIA REPAIR  1963   NASAL FRACTURE SURGERY  1974   PATCH ANGIOPLASTY Right 06/27/2021   Procedure: PATCH ANGIOPLASTY USING Livia Snellen BIOLOGIC PATCH;  Surgeon: Chuck Hint, MD;  Location: [facility] OR;  Service: Vascular;  Laterality: Right;   PERIPHERAL VASCULAR INTERVENTION  04/26/2021   Procedure: PERIPHERAL VASCULAR INTERVENTION;  Surgeon: Iran Ouch, MD;  Location: [facility] INVASIVE CV LAB;  Service: Cardiovascular;;   RIGHT/LEFT HEART CATH AND CORONARY ANGIOGRAPHY N/A 01/25/2021   Procedure: RIGHT/LEFT HEART CATH AND CORONARY ANGIOGRAPHY;  Surgeon: Corky Crafts, MD;  Location: [facility] INVASIVE CV LAB;  Service: Cardiovascular;  Laterality: N/A;   TEE WITHOUT CARDIOVERSION N/A 01/17/2015   Procedure: TRANSESOPHAGEAL ECHOCARDIOGRAM (TEE);  Surgeon: Thurmon Fair, MD;  Location: [facility] ENDOSCOPY;  Service: Cardiovascular;  Laterality: N/A;   WISDOM TOOTH EXTRACTION       Current Outpatient Medications  Medication Sig Dispense Refill   acetaminophen (TYLENOL) 500 MG tablet Take 500 mg by mouth every 6 (six) hours as needed.     amLODipine (NORVASC) 10 MG tablet Take 10 mg by mouth daily.     aspirin EC 81 MG tablet Take 1 tablet (81 mg total) by mouth daily. Swallow whole. 30 tablet 11   atorvastatin (LIPITOR) 80 MG tablet TAKE 1 TABLET BY MOUTH DAILY 90 tablet 3   Carboxymethylcellul-Glycerin (LUBRICATING EYE DROPS OP) Place 1 drop into  both eyes daily as needed (dry eyes).     carvedilol (COREG) 25 MG tablet TAKE 1 TABLET(25 MG) BY MOUTH TWICE DAILY 180 tablet 3   clopidogrel (PLAVIX) 75 MG tablet TAKE 1 TABLET BY MOUTH DAILY WITH BREAKFAST 90 tablet 3   doxylamine, Sleep, (UNISOM) 25 MG tablet Take 25 mg by mouth at bedtime as needed for sleep.     Dulaglutide (TRULICITY) 0.75 MG/0.5ML SOPN Inject 0.75 mg into the skin once a week. 6 mL 3   ENTRESTO 97-103 MG TAKE 1 TABLET BY MOUTH TWICE DAILY 60 tablet 8   ezetimibe (ZETIA) 10 MG tablet TAKE 1 TABLET(10 MG) BY MOUTH DAILY 90 tablet 3   furosemide (LASIX) 20 MG tablet TAKE 1 TABLET(20 MG) BY MOUTH DAILY AS NEEDED FOR LOWER EXTREMITY SWELLING 90 tablet 2   gabapentin (NEURONTIN) 300 MG capsule Take 1 capsule (300 mg total) by mouth 3 (three) times daily as needed. 90 capsule 3   JARDIANCE 10 MG TABS tablet TAKE 1 TABLET(10 MG) BY MOUTH DAILY BEFORE BREAKFAST 90 tablet 3   Multiple Vitamin (MULTIVITAMIN WITH MINERALS) TABS tablet Take 1 tablet by mouth daily.     sildenafil (VIAGRA) 100 MG tablet Take  0.5-1 tablets (50-100 mg total) by mouth daily as needed for erectile dysfunction. 5 tablet 11   tirzepatide (MOUNJARO) 5 MG/0.5ML Pen Inject 5 mg into the skin once a week. 6 mL 11   triamcinolone cream (KENALOG) 0.1 % Apply topically 2 (two) times daily as needed.     No current facility-administered medications for this visit.    Allergies:   Adderall [amphetamine-dextroamphet er], Prozac [fluoxetine hcl], Sertraline hcl, and Advil [ibuprofen]    Social History:  The patient  reports that he quit smoking about 8 years ago. His smoking use included cigarettes. He has never used smokeless tobacco. He reports that he does not currently use alcohol. He reports that he does not use drugs.   Family History:  The patient's family history includes Cancer in his maternal grandfather; Diabetes in his father and paternal grandfather; Heart attack in his mother; Heart attack (age of  onset: 86) in his brother; Heart attack (age of onset: 45) in his maternal grandfather; Hypertension in his father; Stroke in his father and paternal uncle.    ROS:  Please see the history of present illness.   Otherwise, review of systems are positive for none.   All other systems are reviewed and negative.    PHYSICAL EXAM: VS:  BP 92/68 (BP Location: Left Arm, Patient Position: Sitting, Cuff Size: Normal)   Pulse 94   Ht 5\' 8"  (1.727 m)   Wt 185 lb (83.9 kg)   BMI 28.13 kg/m  , BMI Body mass index is 28.13 kg/m. GEN: Well nourished, well developed, in no acute distress  HEENT: normal  Neck: no JVD or masses.  Right carotid bruit Cardiac: RRR; no murmurs, rubs, or gallops,no edema  Respiratory:  clear to auscultation bilaterally, normal work of breathing GI: soft, nontender, nondistended, + BS MS: no deformity or atrophy  Skin: warm and dry, no rash Neuro:  Strength and sensation are intact Psych: euthymic mood, full affect Vascular: Femoral pulses +1 on the right and +2 on the left.   EKG:  EKG is ordered today. EKG showed: Normal sinus rhythm Septal infarct , age undetermined When compared with ECG of 12-Feb-2023 10:25, Septal infarct is now Present T wave inversion no longer evident in Lateral leads   Recent Labs: 10/01/2022: ALT 16; BUN 34; Creatinine, Ser 2.13; Potassium 5.7 No hemolysis seen; Sodium 136 02/12/2023: Magnesium 2.2; TSH 1.700 09/16/2023: Hemoglobin 14.7; Platelets 306    Lipid Panel    Component Value Date/Time   CHOL 117 09/16/2023 1100   CHOL 286 (H) 10/12/2013 0436   TRIG 123 09/16/2023 1100   TRIG 572 (H) 10/12/2013 0436   TRIG 284 (HH) 07/15/2006 1158   HDL 41 09/16/2023 1100   HDL 35 (L) 10/12/2013 0436   CHOLHDL 2.9 09/16/2023 1100   CHOLHDL 3 10/01/2022 1105   VLDL 35.6 10/01/2022 1105   LDLCALC 54 09/16/2023 1100   LDLCALC  03/23/2020 1601     Comment:     . LDL cholesterol not calculated. Triglyceride levels greater than 400 mg/dL  invalidate calculated LDL results. . Reference range: <100 . Desirable range <100 mg/dL for primary prevention;   <70 mg/dL for patients with CHD or diabetic patients  with > or = 2 CHD risk factors. Marland Kitchen LDL-C is now calculated using the Martin-Hopkins  calculation, which is a validated novel method providing  better accuracy than the Friedewald equation in the  estimation of LDL-C.  Horald Pollen et al. Lenox Ahr. 0454;098(11): 2061-2068  (http://education.[employer].com/faq/FAQ164)  LDLCALC NOT CALC 10/12/2013 0436   LDLDIRECT 48.0 03/28/2022 1629      Wt Readings from Last 3 Encounters:  09/24/23 185 lb (83.9 kg)  09/16/23 188 lb (85.3 kg)  08/30/23 187 lb (84.8 kg)           No data to display            ASSESSMENT AND PLAN:  1.  Left subclavian artery stenosis: Status post successful covered stent placement .  No recurrent left arm claudication .  Continue long-term dual antiplatelet therapy as tolerated given his extensive cardiovascular disease.   Most recent carotid Doppler in November showed patent stent with no significant restenosis.  Repeat in 1 year.  2.  Carotid artery stenosis: Occluded left carotid artery.  Status post recent redo right carotid endarterectomy.  Will get follow-up carotid Doppler in November of this year.  3.  Coronary artery disease involving native coronary arteries without angina: He has no anginal symptoms.  Continue medical therapy.    4.  Chronic systolic heart failure: He appears to be euvolemic and currently on carvedilol, Entresto and Jardiance.  Given his low blood pressure, I decreased amlodipine to 5 mg daily.  5.  Peripheral arterial disease: The patient has evidence of bilateral leg claudication worse on the right side.  His right SFA is occluded.  He has mild claudication that does not seem to be lifestyle limiting.  6.  Hyperlipidemia: Continue high-dose atorvastatin and Zetia.  Most recent lipid profile in January showed  an LDL of 54.  7.  Small abdominal aortic aneurysm: I requested a follow-up ultrasound.   Disposition:   FU with me in 12 months  Signed,  Lorine Bears, MD  09/24/2023 10:55 AM    [facility]

## 2023-09-24 NOTE — Patient Instructions (Signed)
Medication Instructions:  - Decrease amlodipine(NORVASC) to 5mg  daily    *If you need a refill on your cardiac medications before your next appointment, please call your pharmacy*   Lab Work: NONE    If you have labs (blood work) drawn today and your tests are completely normal, you will receive your results only by: MyChart Message (if you have MyChart) OR A paper copy in the mail If you have any lab test that is abnormal or we need to change your treatment, we will call you to review the results.   Testing/Procedures: Your physician has requested that you have an abdominal aorta duplex. During this test, an ultrasound is used to evaluate the aorta. Allow 30 minutes for this exam. Do not eat after midnight the day before and avoid carbonated beverages.  Please note: We ask at that you not bring children with you during ultrasound (echo/ vascular) testing. Due to room size and safety concerns, children are not allowed in the ultrasound rooms during exams. Our front office staff cannot provide observation of children in our lobby area while testing is being conducted. An adult accompanying a patient to their appointment will only be allowed in the ultrasound room at the discretion of the ultrasound technician under special circumstances. We apologize for any inconvenience.    Follow-Up: At [facility], you and your health needs are our priority.  As part of our continuing mission to provide you with exceptional heart care, we have created designated Provider Care Teams.  These Care Teams include your primary Cardiologist (physician) and Advanced Practice Providers (APPs -  Physician Assistants and Nurse Practitioners) who all work together to provide you with the care you need, when you need it.  We recommend signing up for the patient portal called "MyChart".  Sign up information is provided on this After Visit Summary.  MyChart is used to connect with patients for Virtual Visits  (Telemedicine).  Patients are able to view lab/test results, encounter notes, upcoming appointments, etc.  Non-urgent messages can be sent to your provider as well.   To learn more about what you can do with MyChart, go to [email or web address].    Your next appointment:   1 year(s)  The format for your next appointment:   In Person  Provider:   Lorine Bears, MD    Other Instructions

## 2023-09-24 NOTE — Therapy (Signed)
OUTPATIENT PHYSICAL THERAPY  TREATMENT   Patient Name: Martin Archer MRN: [id number] DOB:1961/01/27, 63 y.o., male Today's Date: 09/24/2023  END OF SESSION:  PT End of Session - 09/24/23 0924     Visit Number 5    Date for PT Re-Evaluation 11/06/23    Authorization Type [employer]HC medicare/Medicaid secondary    Progress Note Due on Visit 10    PT Start Time 0835    PT Stop Time 0920    PT Time Calculation (min) 45 min    Activity Tolerance Patient tolerated treatment well    Behavior During Therapy [facility] for tasks assessed/performed               Past Medical History:  Diagnosis Date   Anemia, unspecified    Arthritis    Diverticulosis of colon (without mention of hemorrhage)    Gastric ulcer with hemorrhage and obstruction 2011   Dr Julien Girt GI   GERD (gastroesophageal reflux disease)    hx   Hyperlipemia    Hypertension    Personal history of colonic polyps 01/30/2010   hyperplastic rectal   Sleep apnea    on CPAP; [facility]   Stroke [facility])    mini strokes-bilateral carotid endarterectomy   Substance abuse (HCC)    History of alcohol abuse   Past Surgical History:  Procedure Laterality Date   AORTIC ARCH ANGIOGRAPHY N/A 04/26/2021   Procedure: AORTIC ARCH ANGIOGRAPHY;  Surgeon: Iran Ouch, MD;  Location: [facility] INVASIVE CV LAB;  Service: Cardiovascular;  Laterality: N/A;   CARDIAC CATHETERIZATION  01/25/2021   CAROTID ENDARTERECTOMY  11/2006   left Dr Jerilee Field   COLONOSCOPY  2011   DP-positive FOBT/IDA   ENDARTERECTOMY Right 01/19/2015   Procedure: RIGHT CAROTID ENDARTERECTOMY WITH PATCH ANGIOPLASTY;  Surgeon: Pryor Ochoa, MD;  Location: [facility] OR;  Service: Vascular;  Laterality: Right;   ENDARTERECTOMY Right 06/27/2021   Procedure: RIGHT REDO CAROTID ARTERY ENDARTERECTOMY & PLACEMENT OF 15Fr DRAIN;  Surgeon: Chuck Hint, MD;  Location: [facility] OR;  Service: Vascular;  Laterality: Right;   ESOPHAGOGASTRODUODENOSCOPY Left  06/17/2013   Procedure: ESOPHAGOGASTRODUODENOSCOPY (EGD);  Surgeon: Willis Modena, MD;  Location: [facility] ENDOSCOPY;  Service: Endoscopy;  Laterality: Left;   ESOPHAGOGASTRODUODENOSCOPY N/A 09/20/2015   Procedure: ESOPHAGOGASTRODUODENOSCOPY (EGD);  Surgeon: Meryl Dare, MD;  Location: [facility] ENDOSCOPY;  Service: Endoscopy;  Laterality: N/A;   FOOT SURGERY Bilateral 2020   Taylor's foot   INGUINAL HERNIA REPAIR  1963   NASAL FRACTURE SURGERY  1974   PATCH ANGIOPLASTY Right 06/27/2021   Procedure: PATCH ANGIOPLASTY USING Livia Snellen BIOLOGIC PATCH;  Surgeon: Chuck Hint, MD;  Location: [facility] OR;  Service: Vascular;  Laterality: Right;   PERIPHERAL VASCULAR INTERVENTION  04/26/2021   Procedure: PERIPHERAL VASCULAR INTERVENTION;  Surgeon: Iran Ouch, MD;  Location: [facility] INVASIVE CV LAB;  Service: Cardiovascular;;   RIGHT/LEFT HEART CATH AND CORONARY ANGIOGRAPHY N/A 01/25/2021   Procedure: RIGHT/LEFT HEART CATH AND CORONARY ANGIOGRAPHY;  Surgeon: Corky Crafts, MD;  Location: [facility] INVASIVE CV LAB;  Service: Cardiovascular;  Laterality: N/A;   TEE WITHOUT CARDIOVERSION N/A 01/17/2015   Procedure: TRANSESOPHAGEAL ECHOCARDIOGRAM (TEE);  Surgeon: Thurmon Fair, MD;  Location: [facility] ENDOSCOPY;  Service: Cardiovascular;  Laterality: N/A;   WISDOM TOOTH EXTRACTION     Patient Active Problem List   Diagnosis Date Noted   BMI 33.0-33.9,adult 03/08/2023   Erectile dysfunction 10/03/2022   OSA on CPAP 04/10/2022   Skin lesion of left arm 03/29/2022  Diabetes (HCC) 09/24/2021   Vitamin D deficiency 09/24/2021   Diarrhea 09/05/2021   Viral URI 09/05/2021   Sinusitis 09/05/2021   Carotid artery stenosis 06/27/2021   Intermittent pain and swelling of hand 05/29/2021   Stenosis of left subclavian artery (HCC) 04/26/2021   Chronic systolic heart failure (HCC)    Fatigue 12/19/2020   Dyspnea 12/19/2020   Spondylolisthesis of lumbosacral region 05/03/2020   CKD (chronic kidney disease) stage 4,  GFR 15-29 ml/min (HCC) 09/26/2017   Syncope 09/26/2017   Elevated transaminase level 07/25/2017   Cannabis abuse 07/23/2017   Uncomplicated alcohol dependence (HCC) 07/23/2017   Melena    Gastric ulcer with hemorrhage    Right hemiparesis (HCC) 09/18/2015   Chronic blood loss anemia 09/18/2015   Hematemesis 09/18/2015   Encounter for well adult exam with abnormal findings 06/29/2015   History of CEA (carotid endarterectomy) 03/25/2015   HLD (hyperlipidemia) 03/25/2015   Tobacco use disorder 03/25/2015   Carotid artery occlusion with infarction (HCC) 03/25/2015   Carotid aneurysm, 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   Stroke (HCC)    Aortic arch atherosclerosis (HCC)    Carotid stenosis 01/16/2015   Thrombus: BILATERAL EXTERNAL JUGULAR VEINS per CT angio head and neck 01/16/15 01/16/2015   DVT (deep venous thrombosis) (HCC)    Numbness and tingling of left arm and leg 01/15/2015   Cerebral infarction (HCC)    Paresthesia 12/08/2014   Alcohol abuse, in remission 07/14/2013   ADD (attention deficit disorder) 07/14/2013   Nicotine dependence 06/18/2013   Back pain, chronic 06/17/2013   ACUTE GASTRIC ULCER W/HEMORRHAGE AND OBSTRUCTION 02/28/2010   History of colonic polyps 02/28/2010   DIVERTICULOSIS, COLON 02/02/2010   ANEMIA, SEVERE 01/13/2010   Essential hypertension 01/13/2010   Sleep apnea 01/13/2010   Left carotid bruit 01/13/2010   MIXED HEARING LOSS BILATERAL 02/16/2009    PCP: Oliver Barre MD  REFERRING PROVIDER: Clementeen Graham MD  REFERRING DIAG: [phone], G89.29 chronic bilateral low back pain with left sided sciatica; M43.00 pars defect  Rationale for Evaluation and Treatment: Rehabilitation  THERAPY DIAG:  Back pain; weakness  ONSET DATE: > 1 year  SUBJECTIVE:                                                                                                                                                                                           SUBJECTIVE STATEMENT: I am  having less pain overall, less frequent. 40% less frequent pain.  I had some pain when I exerted myself at work.  I took some Tylenol and then I was fine.     Lost 20# taking injections I play golf but  I haven't swung a club in the last few months 2-3 blocks max walk   Has a gym where he lives but does not use  PERTINENT HISTORY:  L5- S1 ESI 12/11; prior ESI "several" See cardiac history above HTN Vascular issues (may need stint in leg) Numbness in both feet On blood thinners Foot surgery 31 month old Grandtr in [place]  PAIN:  09/24/23 Are you having pain? no NPRS scale: 0/10  Pain location: bil low back pain and left LE especially knee to ankle; numbness in both feet Pain orientation: Bilateral  PAIN TYPE: aching Pain description: constant  Aggravating factors: my mattress (waken with low back pain); sleeping, walking Relieving factors: fetal position usually left side; sitting (sometimes relief, sometimes not)  PRECAUTIONS: None    WEIGHT BEARING RESTRICTIONS: No  FALLS:  Has patient fallen in last 6 months? No  OCCUPATION: part-time work 4-6 hours standing   PLOF: Independent with basic ADLs  PATIENT GOALS: I hope to loosen things up; maybe play golf; how can I alleviate the pain?;  go to the gym where I live  OBJECTIVE:  Note: Objective measures were completed at Evaluation unless otherwise noted.  DIAGNOSTIC FINDINGS:  MRI Nov '24 IMPRESSION: 1. 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 There is chronic severe foraminal narrowing bilaterally and probable chronic bilateral L5 nerve root encroachment. 2. Mild spondylosis at the other lumbar levels without significant spinal stenosis or nerve root encroachment. 3. No acute osseous findings.  PATIENT SURVEYS:  Modified Oswestry 40% pt left glasses in the car so questionnaire done verbally and was time consuming   COGNITION: Overall cognitive  status: Within functional limits for tasks assessed     MUSCLE LENGTH: Moderate decrease in HS and hip flexor lengths bilaterally POSTURE: decreased lumbar lordosis  PALPATION: Decreased fascial mobility in lumbar region bil  LUMBAR ROM:   AROM eval  Flexion 6 inches from floor  Extension 10 degrees  Right lateral flexion 20 degrees  Left lateral flexion 20 degrees  Right rotation   Left rotation    (Blank rows = not tested) TRUNK STRENGTH:  Decreased activation of transverse abdominus muscles; abdominals 4-/5; decreased activation of lumbar multifidi; trunk extensors 4-/5  LOWER EXTREMITY ROM:   grossly WFLs except decreased hip external rotation bil left more limited than right  LOWER EXTREMITY MMT:      MMT Right eval Left eval  Hip flexion 4+ 4+  Hip extension 4+ 4+  Hip abduction 4 4-  Hip adduction    Hip internal rotation    Hip external rotation    Knee flexion    Knee extension 4+ 4+  Ankle dorsiflexion    Ankle plantarflexion 4 4  Ankle inversion    Ankle eversion     (Blank rows = not tested)  LUMBAR SPECIAL TESTS:  Slump test: Negative  FUNCTIONAL TESTS:  Able to rise from the chair without UE assist Able to stoop to pick up small object with ease SLS < 3 sec bilaterally; much less stable on left with pelvic drop 09/12/23: 6 min walk test: 60- age related norm is 1745  GAIT:  Comments: WFLs for short distances   TREATMENT DATE:  09/24/23:  Nu-Step L4x 8 minutes (old model)-PT present to discuss progress 2nd step hip flexor stretch 3 sets of 5 with UE reaches up and overhead right/left Seated hamstring stretch 3x20 seconds  Standing core strengthening series holding pair of 5 pound dumbbells  while marching sets of 8 reps each:  1) Farmers hold; 2) single at the shoulder hold; 3) single overhead press hold  Seated trunk extension red power cord 25x Sit to stand holding 10# kettlebell 2x10 Standing bil 5# dumbbells modified hip hinge x15  (tactile cues for straight back) Hip flexor stretch at step: 5x10 seconds  Farmer's carry around building: 10# kettlebell Rt and Lt  Trigger Point Dry Needling Subsequent Treatment: Instructions reviewed, if requested by the patient, prior to subsequent dry needling treatment.  Patient Verbal Consent Given: Yes Education Handout Provided: Previously Provided Muscles Treated: bil lumbar multifidi and gluteals  Electrical Stimulation Performed: Yes, Parameters: 1.5 ma 80 pps 8 min to bil lumbar multifidi and gluteals Treatment Response/Outcome: twitch response and improved tissue mobility  Elongation and release to bil low back and gluteals 09/20/23:  Nu-Step L5 new model 5 min 2nd step hip flexor stretch 3 sets of 5 with UE reaches up and overhead right/left 2nd step HS 5x right/left Standing core strengthening series holding pair of 5 pound dumbbells while marching sets of 8 reps each:  1) Farmers hold; 2) single at the shoulder hold; 3) single overhead press hold  Sit to stand holding 5# 8x; holding both 5# dumbbells at the shoulder 8x Red power cord extensions x20 Standing bil 5# dumbbells modified hip hinge 10x (verbal cues for forward gaze) Quadruped rock with crossed ankles and biased angles 10x Open books 10x right/left with foam roll  08/29/23:  Nu-Step L4 new model 3 min 2nd step hip flexor stretch 3 sets of 5 with UE reaches up and overhead right/left 2nd step HS 5x right/left Seated 5# Kettlebell chops to simulate golf swing 10x right/left  Standing holding 5# weight farmer's hold with BOSU taps 10x right/left Home TENS info given Trigger Point Dry Needling Subsequent Treatment: Instructions reviewed, if requested by the patient, prior to subsequent dry needling treatment.   Patient Verbal Consent Given: Yes Education Handout Provided: Previously Provided Muscles Treated: bil lumbar multifidi and gluteals  Electrical Stimulation Performed: Yes, Parameters: 1.5 ma 80 pps 8  min to bil lumbar multifidi and gluteals Treatment Response/Outcome: twitch response and improved tissue mobility  Elongation and release to bil low back and gluteals  Patient Verbal Consent Given: Yes Education Handout Provided: Yes Muscles Treated: bil lumbar multifidi and gluteals  Treatment Response/Outcome: twitch response and improved tissue mobility  Elongation and release to bil low back and gluteals  PATIENT EDUCATION:  Education details: Access Code: [email or web address], Dry needling; home TENS info Person educated: Patient Education method: Explanation Education comprehension: verbalized understanding  HOME EXERCISE PROGRAM: Access Code: [email or web address] URL: [email or web address] Date: 09/20/2023 Prepared by: Lavinia Sharps  Exercises - Supine Lower Trunk Rotation  - 3 x daily - 7 x weekly - 1 sets - 3 reps - 20 hold - Hooklying Single Knee to Chest  - 3 x daily - 7 x weekly - 1 sets - 3 reps - 20 hold - Seated Hamstring Stretch  - 3 x daily - 7 x weekly - 1 sets - 3 reps - 20 hold - Seated Piriformis Stretch with Trunk Bend  - 3 x daily - 7 x weekly - 1 sets - 3 reps - 20 hold - Sit to Stand with Arms Crossed  - 1 x daily - 7 x weekly - 1 sets - 10 reps - Standing Hamstring Stretch with Step  - 1 x daily - 7 x weekly - 1 sets - 5 reps - Standing Knee  Flexion Stretch on Step  - 1 x daily - 7 x weekly - 1 sets - 5 reps - Half Deadlift with Kettlebell  - 1 x daily - 7 x weekly - 1 sets - 10 reps - Quadruped Rocking Backward  - 1 x daily - 7 x weekly - 1 sets - 10 reps - Sidelying Open Book Thoracic Rotation with Knee on Foam Roll  - 1 x daily - 7 x weekly - 1 sets - 10 reps  ASSESSMENT:  CLINICAL IMPRESSION: Pt reports 40% overall reduction in the frequency of pain.  When he has pain, he utilizes stretching and takes Tylenol to help. Pt is doing well with strength and flexibility progression and PT monitored throughout session today for pain and technique.  Pt denies any  limitation with lifting light weight objects and is making body mechanics modifications with this. No pain reported during treatment session.  Good response to DN with twitch response and improved tissue mobility after.  Patient will benefit from skilled PT to address the below impairments and improve overall function.    OBJECTIVE IMPAIRMENTS: decreased activity tolerance, difficulty walking, decreased strength, increased fascial restrictions, impaired perceived functional ability, impaired flexibility, postural dysfunction, and pain.   ACTIVITY LIMITATIONS: carrying, lifting, sleeping, and locomotion level  PARTICIPATION LIMITATIONS: driving, community activity, and occupation  PERSONAL FACTORS: Past/current experiences, Time since onset of injury/illness/exacerbation, and 1-2 comorbidities: HTN; vascular issues   are also affecting patient's functional outcome.   REHAB POTENTIAL: Good  CLINICAL DECISION MAKING: Stable/uncomplicated  EVALUATION COMPLEXITY: Low   GOALS: Goals reviewed with patient? Yes  SHORT TERM GOALS: Target date: 10/09/2023    The patient will demonstrate knowledge of basic self care strategies and exercises to promote healing  Baseline: using stretching and activity modification to address symptoms (09/24/23) Goal status: MET  2.  The patient will report a 30% improvement in pain levels with functional activities which are currently difficult including sleeping and walking Baseline: 40% (09/24/23) Goal status: MET  3.  The patient will have improved trunk flexor and extensor muscle strength to at least 4/5 needed for lifting medium weight objects such as grocery bags, laundry and luggage  Baseline: 09/24/23 (09/24/23) Goal status: MET  4.  The patient will have improved left hip strength to at least 4+/5 needed for standing, walking longer distances at home and in the community  Baseline:  Goal status: INITIAL    LONG TERM GOALS: Target date: 11/06/2023     The patient will be independent in a safe self progression of a home and/or gym exercise program to promote further recovery of function   Baseline:  Goal status: INITIAL  2.  The patient will report a 60% improvement in pain levels with functional activities which are currently difficult including sleeping and walking Baseline:  Goal status: INITIAL  3.  The patient will have improved trunk flexor and extensor muscle strength to at least 4+/5 needed for lifting his 36 month old granddaughter Baseline:  Goal status: INITIAL  4.  The patient will be able to walk 900 feet in 6 minutes with pain level <5/10 Baseline: 1007 with 5/10 bil leg pain Goal status: MET  5.  Modified Oswestry score improved to 31% indicating improved function with less pain Baseline:  Goal status: INITIAL   PLAN:  PT FREQUENCY: 2x/week  PT DURATION: 8 weeks  PLANNED INTERVENTIONS: 97164- PT Re-evaluation, 97110-Therapeutic exercises, 97530- Therapeutic activity, [id number]- Neuromuscular re-education, 97535- Self Care, 78469- Manual therapy, [id number]- Aquatic  Therapy, 97014- Electrical stimulation (unattended), [id number]- Electrical stimulation (manual), 40981- Ultrasound, [id number]- Traction (mechanical), 19147- Ionotophoresis 4mg /ml Dexamethasone, Patient/Family education, Taping, Dry Needling, Joint mobilization, Spinal manipulation, Spinal mobilization, Cryotherapy, and Moist heat.  PLAN FOR NEXT SESSION:  assess response to DN, Nustep; core strengthening; hip hinge; squats, trunk mobility ex's for golf  Lorrene Reid, PT 09/24/23 9:26 AM    Phone: [phone] Fax: [phone]

## 2023-09-26 ENCOUNTER — Ambulatory Visit: Payer: 59

## 2023-09-26 ENCOUNTER — Telehealth: Payer: Self-pay

## 2023-09-26 NOTE — Telephone Encounter (Signed)
Left voicemail due to no-show appt for PT today.

## 2023-09-29 ENCOUNTER — Other Ambulatory Visit: Payer: Self-pay | Admitting: Family Medicine

## 2023-09-30 NOTE — Telephone Encounter (Signed)
Last OV 1/3/5  Last refill 06/06/23 Qty #90/3

## 2023-10-01 ENCOUNTER — Ambulatory Visit: Payer: 59 | Attending: Family Medicine | Admitting: Physical Therapy

## 2023-10-01 DIAGNOSIS — M6281 Muscle weakness (generalized): Secondary | ICD-10-CM | POA: Insufficient documentation

## 2023-10-01 DIAGNOSIS — M5432 Sciatica, left side: Secondary | ICD-10-CM | POA: Insufficient documentation

## 2023-10-01 NOTE — Therapy (Signed)
 OUTPATIENT PHYSICAL THERAPY  TREATMENT   Patient Name: Martin Archer MRN: [id number] DOB:[birth year], 63 y.o., male Today's Date: 10/01/2023  END OF SESSION:  PT End of Session - 10/01/23 0845     Visit Number 6    Date for PT Re-Evaluation 11/06/23    Authorization Type UHC medicare/Medicaid secondary    Progress Note Due on Visit 10    PT Start Time [phone]    PT Stop Time 0930    PT Time Calculation (min) 44 min    Activity Tolerance Patient tolerated treatment well               Past Medical History:  Diagnosis Date   Anemia, unspecified    Arthritis    Diverticulosis of colon (without mention of hemorrhage)    Gastric ulcer with hemorrhage and obstruction 2011   Dr Jakie Finn [facility]   GERD (gastroesophageal reflux disease)    hx   Hyperlipemia    Hypertension    Personal history of colonic polyps 01/30/2010   hyperplastic rectal   Sleep apnea    on CPAP; [facility]   Stroke [facility])    mini strokes-bilateral carotid endarterectomy   Substance abuse (HCC)    History of alcohol  abuse   Past Surgical History:  Procedure Laterality Date   AORTIC ARCH ANGIOGRAPHY N/A 04/26/2021   Procedure: AORTIC ARCH ANGIOGRAPHY;  Surgeon: Darron Deatrice LABOR, MD;  Location: [facility] INVASIVE [facility] LAB;  Service: Cardiovascular;  Laterality: N/A;   CARDIAC CATHETERIZATION  01/25/2021   CAROTID ENDARTERECTOMY  11/2006   left Dr JINNY JONETTA Collum   COLONOSCOPY  2011   DP-positive FOBT/IDA   ENDARTERECTOMY Right 01/19/2015   Procedure: RIGHT CAROTID ENDARTERECTOMY WITH PATCH ANGIOPLASTY;  Surgeon: Lynwood JONETTA Collum, MD;  Location: [facility] OR;  Service: Vascular;  Laterality: Right;   ENDARTERECTOMY Right 06/27/2021   Procedure: RIGHT REDO CAROTID ARTERY ENDARTERECTOMY & PLACEMENT OF 15Fr DRAIN;  Surgeon: Eliza Lonni RAMAN, MD;  Location: [facility] OR;  Service: Vascular;  Laterality: Right;   ESOPHAGOGASTRODUODENOSCOPY Left 06/17/2013   Procedure: ESOPHAGOGASTRODUODENOSCOPY (EGD);   Surgeon: Elsie Cree, MD;  Location: [facility] ENDOSCOPY;  Service: Endoscopy;  Laterality: Left;   ESOPHAGOGASTRODUODENOSCOPY N/A 09/20/2015   Procedure: ESOPHAGOGASTRODUODENOSCOPY (EGD);  Surgeon: Gwendlyn ONEIDA Buddy, MD;  Location: [facility] ENDOSCOPY;  Service: Endoscopy;  Laterality: N/A;   FOOT SURGERY Bilateral 2020   Taylor's foot   INGUINAL HERNIA REPAIR  1963   NASAL FRACTURE SURGERY  1974   PATCH ANGIOPLASTY Right 06/27/2021   Procedure: PATCH ANGIOPLASTY USING GEORGE BIOLOGIC PATCH;  Surgeon: Eliza Lonni RAMAN, MD;  Location: [facility] OR;  Service: Vascular;  Laterality: Right;   PERIPHERAL VASCULAR INTERVENTION  04/26/2021   Procedure: PERIPHERAL VASCULAR INTERVENTION;  Surgeon: Darron Deatrice LABOR, MD;  Location: [facility]VAS[facility];  Service: Cardiovascular;;   RIGHT/LEFT HEART CATH AND CORONARY ANGIOGRAPHY N/A 01/25/2021   Procedure: RIGHT/LEFT HEART CATH AND CORONARY ANGIOGRAPHY;  Surgeon: Dann Candyce RAMAN, MD;  Location: [facility] INVASIVE CV LAB;  Service: Cardiovascular;  Laterality: N/A;   TEE WITHOUT CARDIOVERSION N/A 01/17/2015   Procedure: TRANSESOPHAGEAL ECHOCARDIOGRAM (TEE);  Surgeon: Jerel Balding, MD;  Location: [facility] ENDOSCOPY;  Service: Cardiovascular;  Laterality: N/A;   WISDOM TOOTH EXTRACTION     Patient Active Problem List   Diagnosis Date Noted   BMI 33.0-33.9,adult 03/08/2023   Erectile dysfunction 10/03/2022   OSA on CPAP 04/10/2022   Skin lesion of left arm 03/29/2022   Diabetes (HCC) 09/24/2021   Vitamin D  deficiency 09/24/2021  Diarrhea 09/05/2021   Viral URI 09/05/2021   Sinusitis 09/05/2021   Carotid artery stenosis 06/27/2021   Intermittent pain and swelling of hand 05/29/2021   Stenosis of left subclavian artery (HCC) 04/26/2021   Chronic systolic heart failure (HCC)    Fatigue 12/19/2020   Dyspnea 12/19/2020   Spondylolisthesis of lumbosacral region 05/03/2020   CKD (chronic kidney disease) stage 4, GFR 15-29 ml/min (HCC) 09/26/2017   Syncope 09/26/2017    Elevated transaminase level 07/25/2017   Cannabis abuse 07/23/2017   Uncomplicated alcohol  dependence (HCC) 07/23/2017   Melena    Gastric ulcer with hemorrhage    Right hemiparesis (HCC) 09/18/2015   Chronic blood loss anemia 09/18/2015   Hematemesis 09/18/2015   Encounter for well adult exam with abnormal findings 06/29/2015   History of CEA (carotid endarterectomy) 03/25/2015   HLD (hyperlipidemia) 03/25/2015   Tobacco use disorder 03/25/2015   Carotid artery occlusion with infarction (HCC) 03/25/2015   Carotid aneurysm, 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   Stroke (HCC)    Aortic arch atherosclerosis (HCC)    Carotid stenosis 01/16/2015   Thrombus: BILATERAL EXTERNAL JUGULAR VEINS per CT angio head and neck 01/16/15 01/16/2015   DVT (deep venous thrombosis) (HCC)    Numbness and tingling of left arm and leg 01/15/2015   Cerebral infarction (HCC)    Paresthesia 12/08/2014   Alcohol  abuse, in remission 07/14/2013   ADD (attention deficit disorder) 07/14/2013   Nicotine  dependence 06/18/2013   Back pain, chronic 06/17/2013   ACUTE GASTRIC ULCER W/HEMORRHAGE AND OBSTRUCTION 02/28/2010   History of colonic polyps 02/28/2010   DIVERTICULOSIS, COLON 02/02/2010   ANEMIA, SEVERE 01/13/2010   Essential hypertension 01/13/2010   Sleep apnea 01/13/2010   Left carotid bruit 01/13/2010   MIXED HEARING LOSS BILATERAL 02/16/2009    PCP: Norleen Agent MD  REFERRING PROVIDER: Joane Birmingham MD  REFERRING DIAG: [phone], G89.29 chronic bilateral low back pain with left sided sciatica; M43.00 pars defect  Rationale for Evaluation and Treatment: Rehabilitation  THERAPY DIAG:  Back pain; weakness  ONSET DATE: > 1 year  SUBJECTIVE:                                                                                                                                                                                           SUBJECTIVE STATEMENT: Back has been good.  Some stiffness in the mornings.  Played golf,  didn't play well.  I didn't follow through b/c I'm worried about the rotation.  I need to complete that swing.     Lost 20# taking injections I play golf but I haven't swung a club in the last few months  2-3 blocks max walk   Has a gym where he lives but does not use  PERTINENT HISTORY:  L5- S1 ESI 12/11; prior ESI several See cardiac history above HTN Vascular issues (may need stint in leg) Numbness in both feet On blood thinners Foot surgery 26 month old Grandtr in [place]  PAIN:  Are you having pain? no NPRS scale: 0/10  Pain location: bil low back pain and left LE especially knee to ankle; numbness in both feet Pain orientation: Bilateral  PAIN TYPE: aching Pain description: constant  Aggravating factors: my mattress (waken with low back pain); sleeping, walking Relieving factors: fetal position usually left side; sitting (sometimes relief, sometimes not)  PRECAUTIONS: None    WEIGHT BEARING RESTRICTIONS: No  FALLS:  Has patient fallen in last 6 months? No  OCCUPATION: part-time work 4-6 hours standing   PLOF: Independent with basic ADLs  PATIENT GOALS: I hope to loosen things up; maybe play golf; how can I alleviate the pain?;  go to the gym where I live  OBJECTIVE:  Note: Objective measures were completed at Evaluation unless otherwise noted.  DIAGNOSTIC FINDINGS:  MRI Nov '24 IMPRESSION: 1. 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 There is chronic severe foraminal narrowing bilaterally and probable chronic bilateral L5 nerve root encroachment. 2. Mild spondylosis at the other lumbar levels without significant spinal stenosis or nerve root encroachment. 3. No acute osseous findings.  PATIENT SURVEYS:  Modified Oswestry 40% pt left glasses in the car so questionnaire done verbally and was time consuming   COGNITION: Overall cognitive status: Within functional limits for tasks  assessed     MUSCLE LENGTH: Moderate decrease in HS and hip flexor lengths bilaterally POSTURE: decreased lumbar lordosis  PALPATION: Decreased fascial mobility in lumbar region bil  LUMBAR ROM:   AROM eval  Flexion 6 inches from floor  Extension 10 degrees  Right lateral flexion 20 degrees  Left lateral flexion 20 degrees  Right rotation   Left rotation    (Blank rows = not tested) TRUNK STRENGTH:  Decreased activation of transverse abdominus muscles; abdominals 4-/5; decreased activation of lumbar multifidi; trunk extensors 4-/5  LOWER EXTREMITY ROM:   grossly WFLs except decreased hip external rotation bil left more limited than right  LOWER EXTREMITY MMT:      MMT Right eval Left eval  Hip flexion 4+ 4+  Hip extension 4+ 4+  Hip abduction 4 4-  Hip adduction    Hip internal rotation    Hip external rotation    Knee flexion    Knee extension 4+ 4+  Ankle dorsiflexion    Ankle plantarflexion 4 4  Ankle inversion    Ankle eversion     (Blank rows = not tested)  LUMBAR SPECIAL TESTS:  Slump test: Negative  FUNCTIONAL TESTS:  Able to rise from the chair without UE assist Able to stoop to pick up small object with ease SLS < 3 sec bilaterally; much less stable on left with pelvic drop 09/12/23: 6 min walk test: 40- age related norm is 1745  GAIT:  Comments: WFLs for short distances   TREATMENT DATE:  10/01/23:  2nd step hip flexor stretch 3 sets of 5 with UE reaches up and overhead right/left standing hamstring stretch on the 2nd step 3x20 seconds  Open books with top leg on foam roll 10x right/left Quadruped foam roll thread the needle 10x right/left Standing red power cord golf simulation 1/2 backswing 12x  each way Manual therapy: soft tissue mobilization lumbar musculature Nu-Step L5 5 min  Trigger Point Dry Needling Subsequent Treatment: Instructions reviewed, if requested by the patient, prior to subsequent dry needling treatment.  Patient Verbal  Consent Given: Yes Education Handout Provided: Previously Provided Muscles Treated: bil lumbar multifidi and gluteals  Electrical Stimulation Performed: Yes, Parameters: 1.5 ma 80 pps 8 min to bil lumbar multifidi and gluteals Treatment Response/Outcome: twitch response and improved tissue mobility  Elongation and release to bil low back and gluteals  09/24/23:  Nu-Step L4x 8 minutes (old model)-PT present to discuss progress 2nd step hip flexor stretch 3 sets of 5 with UE reaches up and overhead right/left Seated hamstring stretch 3x20 seconds  Standing core strengthening series holding pair of 5 pound dumbbells while marching sets of 8 reps each:  1) Farmers hold; 2) single at the shoulder hold; 3) single overhead press hold  Seated trunk extension red power cord 25x Sit to stand holding 10# kettlebell 2x10 Standing bil 5# dumbbells modified hip hinge x15 (tactile cues for straight back) Hip flexor stretch at step: 5x10 seconds  Farmer's carry around building: 10# kettlebell Rt and Lt  Trigger Point Dry Needling Subsequent Treatment: Instructions reviewed, if requested by the patient, prior to subsequent dry needling treatment.  Patient Verbal Consent Given: Yes Education Handout Provided: Previously Provided Muscles Treated: bil lumbar multifidi and gluteals  Electrical Stimulation Performed: Yes, Parameters: 1.5 ma 80 pps 8 min to bil lumbar multifidi and gluteals Treatment Response/Outcome: twitch response and improved tissue mobility  Elongation and release to bil low back and gluteals 09/20/23:  Nu-Step L5 new model 5 min 2nd step hip flexor stretch 3 sets of 5 with UE reaches up and overhead right/left 2nd step HS 5x right/left Standing core strengthening series holding pair of 5 pound dumbbells while marching sets of 8 reps each:  1) Farmers hold; 2) single at the shoulder hold; 3) single overhead press hold  Sit to stand holding 5# 8x; holding both 5# dumbbells at the shoulder  8x Red power cord extensions x20 Standing bil 5# dumbbells modified hip hinge 10x (verbal cues for forward gaze) Quadruped rock with crossed ankles and biased angles 10x Open books 10x right/left with foam roll  08/29/23:  Nu-Step L4 new model 3 min 2nd step hip flexor stretch 3 sets of 5 with UE reaches up and overhead right/left 2nd step HS 5x right/left Seated 5# Kettlebell chops to simulate golf swing 10x right/left  Standing holding 5# weight farmer's hold with BOSU taps 10x right/left Home TENS info given Trigger Point Dry Needling Subsequent Treatment: Instructions reviewed, if requested by the patient, prior to subsequent dry needling treatment.   Patient Verbal Consent Given: Yes Education Handout Provided: Previously Provided Muscles Treated: bil lumbar multifidi and gluteals  Electrical Stimulation Performed: Yes, Parameters: 1.5 ma 80 pps 8 min to bil lumbar multifidi and gluteals Treatment Response/Outcome: twitch response and improved tissue mobility  Elongation and release to bil low back and gluteals  Patient Verbal Consent Given: Yes Education Handout Provided: Yes Muscles Treated: bil lumbar multifidi and gluteals  Treatment Response/Outcome: twitch response and improved tissue mobility  Elongation and release to bil low back and gluteals  PATIENT EDUCATION:  Education details: Access Code: [email or web address], Dry needling; home TENS info Person educated: Patient Education method: Explanation Education comprehension: verbalized understanding  HOME EXERCISE PROGRAM: Access Code: [email or web address] URL: [email or web address] Date: 09/20/2023 Prepared by: Glade Pesa  Exercises - Supine Lower Trunk Rotation  - 3  x daily - 7 x weekly - 1 sets - 3 reps - 20 hold - Hooklying Single Knee to Chest  - 3 x daily - 7 x weekly - 1 sets - 3 reps - 20 hold - Seated Hamstring Stretch  - 3 x daily - 7 x weekly - 1 sets - 3 reps - 20 hold - Seated Piriformis Stretch with Trunk  Bend  - 3 x daily - 7 x weekly - 1 sets - 3 reps - 20 hold - Sit to Stand with Arms Crossed  - 1 x daily - 7 x weekly - 1 sets - 10 reps - Standing Hamstring Stretch with Step  - 1 x daily - 7 x weekly - 1 sets - 5 reps - Standing Knee Flexion Stretch on Step  - 1 x daily - 7 x weekly - 1 sets - 5 reps - Half Deadlift with Kettlebell  - 1 x daily - 7 x weekly - 1 sets - 10 reps - Quadruped Rocking Backward  - 1 x daily - 7 x weekly - 1 sets - 10 reps - Sidelying Open Book Thoracic Rotation with Knee on Foam Roll  - 1 x daily - 7 x weekly - 1 sets - 10 reps  ASSESSMENT:  CLINICAL IMPRESSION: Emphasis on improving trunk mobility particularly rotation needed for playing golf.  Verbal and tactile cues to improve technique with exercises and limit compensatory strategies. Back pain overall significantly improved which he attributes to DN combined with ES.    OBJECTIVE IMPAIRMENTS: decreased activity tolerance, difficulty walking, decreased strength, increased fascial restrictions, impaired perceived functional ability, impaired flexibility, postural dysfunction, and pain.   ACTIVITY LIMITATIONS: carrying, lifting, sleeping, and locomotion level  PARTICIPATION LIMITATIONS: driving, community activity, and occupation  PERSONAL FACTORS: Past/current experiences, Time since onset of injury/illness/exacerbation, and 1-2 comorbidities: HTN; vascular issues   are also affecting patient's functional outcome.   REHAB POTENTIAL: Good  CLINICAL DECISION MAKING: Stable/uncomplicated  EVALUATION COMPLEXITY: Low   GOALS: Goals reviewed with patient? Yes  SHORT TERM GOALS: Target date: 10/09/2023    The patient will demonstrate knowledge of basic self care strategies and exercises to promote healing  Baseline: using stretching and activity modification to address symptoms (09/24/23) Goal status: MET  2.  The patient will report a 30% improvement in pain levels with functional activities which are  currently difficult including sleeping and walking Baseline: 40% (09/24/23) Goal status: MET  3.  The patient will have improved trunk flexor and extensor muscle strength to at least 4/5 needed for lifting medium weight objects such as grocery bags, laundry and luggage  Baseline: 09/24/23 (09/24/23) Goal status: MET  4.  The patient will have improved left hip strength to at least 4+/5 needed for standing, walking longer distances at home and in the community  Baseline:  Goal status: INITIAL    LONG TERM GOALS: Target date: 11/06/2023    The patient will be independent in a safe self progression of a home and/or gym exercise program to promote further recovery of function   Baseline:  Goal status: INITIAL  2.  The patient will report a 60% improvement in pain levels with functional activities which are currently difficult including sleeping and walking Baseline:  Goal status: INITIAL  3.  The patient will have improved trunk flexor and extensor muscle strength to at least 4+/5 needed for lifting his 84 month old granddaughter Baseline:  Goal status: INITIAL  4.  The patient will be able  to walk 900 feet in 6 minutes with pain level <5/10 Baseline: 1007 with 5/10 bil leg pain Goal status: MET  5.  Modified Oswestry score improved to 31% indicating improved function with less pain Baseline:  Goal status: INITIAL   PLAN:  PT FREQUENCY: 2x/week  PT DURATION: 8 weeks  PLANNED INTERVENTIONS: 97164- PT Re-evaluation, 97110-Therapeutic exercises, 97530- Therapeutic activity, 97112- Neuromuscular re-education, 97535- Self Care, 02859- Manual therapy, [id number]- Aquatic Therapy, 97014- Electrical stimulation (unattended), [id number]- Electrical stimulation (manual), [id number]- Ultrasound, [id number]- Traction (mechanical), [id number]- Ionotophoresis 4mg /ml Dexamethasone , Patient/Family education, Taping, Dry Needling, Joint mobilization, Spinal manipulation, Spinal mobilization, Cryotherapy, and Moist  heat.  PLAN FOR NEXT SESSION:   DN/ES lumbar, Nustep; core strengthening; hip hinge; squats, trunk mobility ex's for golf  Glade Pesa, PT 10/01/23 7:14 PM Phone: [phone] Fax: [phone]  Phone: [phone] Fax: [phone]

## 2023-10-03 ENCOUNTER — Ambulatory Visit: Payer: 59 | Admitting: Physical Therapy

## 2023-10-03 DIAGNOSIS — M5432 Sciatica, left side: Secondary | ICD-10-CM

## 2023-10-03 DIAGNOSIS — M6281 Muscle weakness (generalized): Secondary | ICD-10-CM

## 2023-10-03 NOTE — Therapy (Signed)
 OUTPATIENT PHYSICAL THERAPY  TREATMENT   Patient Name: Martin Archer MRN: [id number] DOB:1961/06/20, 63 y.o., male Today's Date: 10/03/2023  END OF SESSION:  PT End of Session - 10/03/23 0811     Visit Number 7    Date for PT Re-Evaluation 11/06/23    Authorization Type UHC medicare/Medicaid secondary    Progress Note Due on Visit 10    PT Start Time 0801    PT Stop Time 0844    PT Time Calculation (min) 43 min    Activity Tolerance Patient tolerated treatment well               Past Medical History:  Diagnosis Date   Anemia, unspecified    Arthritis    Diverticulosis of colon (without mention of hemorrhage)    Gastric ulcer with hemorrhage and obstruction 2011   Dr Jakie Finn [facility]   GERD (gastroesophageal reflux disease)    hx   Hyperlipemia    Hypertension    Personal history of colonic polyps 01/30/2010   hyperplastic rectal   Sleep apnea    on CPAP; [facility]   Stroke [facility])    mini strokes-bilateral carotid endarterectomy   Substance abuse (HCC)    History of alcohol  abuse   Past Surgical History:  Procedure Laterality Date   AORTIC ARCH ANGIOGRAPHY N/A 04/26/2021   Procedure: AORTIC ARCH ANGIOGRAPHY;  Surgeon: Darron Deatrice LABOR, MD;  Location: [facility] INVASIVE CV LAB;  Service: Cardiovascular;  Laterality: N/A;   CARDIAC CATHETERIZATION  01/25/2021   CAROTID ENDARTERECTOMY  11/2006   left Dr JINNY JONETTA Collum   COLONOSCOPY  2011   DP-positive FOBT/IDA   ENDARTERECTOMY Right 01/19/2015   Procedure: RIGHT CAROTID ENDARTERECTOMY WITH PATCH ANGIOPLASTY;  Surgeon: Lynwood JONETTA Collum, MD;  Location: [facility] OR;  Service: Vascular;  Laterality: Right;   ENDARTERECTOMY Right 06/27/2021   Procedure: RIGHT REDO CAROTID ARTERY ENDARTERECTOMY & PLACEMENT OF 15Fr DRAIN;  Surgeon: Eliza Lonni RAMAN, MD;  Location: [facility] OR;  Service: Vascular;  Laterality: Right;   ESOPHAGOGASTRODUODENOSCOPY Left 06/17/2013   Procedure: ESOPHAGOGASTRODUODENOSCOPY (EGD);   Surgeon: Elsie Cree, MD;  Location: [facility] ENDOSCOPY;  Service: Endoscopy;  Laterality: Left;   ESOPHAGOGASTRODUODENOSCOPY N/A 09/20/2015   Procedure: ESOPHAGOGASTRODUODENOSCOPY (EGD);  Surgeon: Gwendlyn ONEIDA Buddy, MD;  Location: [facility] ENDOSCOPY;  Service: Endoscopy;  Laterality: N/A;   FOOT SURGERY Bilateral 2020   Taylor's foot   INGUINAL HERNIA REPAIR  1963   NASAL FRACTURE SURGERY  1974   PATCH ANGIOPLASTY Right 06/27/2021   Procedure: PATCH ANGIOPLASTY USING GEORGE BIOLOGIC PATCH;  Surgeon: Eliza Lonni RAMAN, MD;  Location: [facility] OR;  Service: Vascular;  Laterality: Right;   PERIPHERAL VASCULAR INTERVENTION  04/26/2021   Procedure: PERIPHERAL VASCULAR INTERVENTION;  Surgeon: Darron Deatrice LABOR, MD;  Location: [facility] INVAS[facility] CV LAB;  Service: Cardiovascular;;   RIGHT/LEFT HEART CATH AND CORONARY ANGIOGRAPHY N/A 01/25/2021   Procedure: RIGHT/LEFT HEART CATH AND CORONARY ANGIOGRAPHY;  Surgeon: Dann Candyce RAMAN, MD;  Location: [facility] INVASIVE CV LAB;  Service: Cardiovascular;  Laterality: N/A;   TEE WITHOUT CARDIOVERSION N/A 01/17/2015   Procedure: TRANSESOPHAGEAL ECHOCARDIOGRAM (TEE);  Surgeon: Jerel Balding, MD;  Location: [facility] ENDOSCOPY;  Service: Cardiovascular;  Laterality: N/A;   WISDOM TOOTH EXTRACTION     Patient Active Problem List   Diagnosis Date Noted   BMI 33.0-33.9,adult 03/08/2023   Erectile dysfunction 10/03/2022   OSA on CPAP 04/10/2022   Skin lesion of left arm 03/29/2022   Diabetes (HCC) 09/24/2021   Vitamin D  deficiency 09/24/2021  Diarrhea 09/05/2021   Viral URI 09/05/2021   Sinusitis 09/05/2021   Carotid artery stenosis 06/27/2021   Intermittent pain and swelling of hand 05/29/2021   Stenosis of left subclavian artery (HCC) 04/26/2021   Chronic systolic heart failure (HCC)    Fatigue 12/19/2020   Dyspnea 12/19/2020   Spondylolisthesis of lumbosacral region 05/03/2020   CKD (chronic kidney disease) stage 4, GFR 15-29 ml/min (HCC) 09/26/2017   Syncope 09/26/2017    Elevated transaminase level 07/25/2017   Cannabis abuse 07/23/2017   Uncomplicated alcohol  dependence (HCC) 07/23/2017   Melena    Gastric ulcer with hemorrhage    Right hemiparesis (HCC) 09/18/2015   Chronic blood loss anemia 09/18/2015   Hematemesis 09/18/2015   Encounter for well adult exam with abnormal findings 06/29/2015   History of CEA (carotid endarterectomy) 03/25/2015   HLD (hyperlipidemia) 03/25/2015   Tobacco use disorder 03/25/2015   Carotid artery occlusion with infarction (HCC) 03/25/2015   Carotid aneurysm, 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   Stroke (HCC)    Aortic arch atherosclerosis (HCC)    Carotid stenosis 01/16/2015   Thrombus: BILATERAL EXTERNAL JUGULAR VEINS per CT angio head and neck 01/16/15 01/16/2015   DVT (deep venous thrombosis) (HCC)    Numbness and tingling of left arm and leg 01/15/2015   Cerebral infarction (HCC)    Paresthesia 12/08/2014   Alcohol  abuse, in remission 07/14/2013   ADD (attention deficit disorder) 07/14/2013   Nicotine  dependence 06/18/2013   Back pain, chronic 06/17/2013   ACUTE GASTRIC ULCER W/HEMORRHAGE AND OBSTRUCTION 02/28/2010   History of colonic polyps 02/28/2010   DIVERTICULOSIS, COLON 02/02/2010   ANEMIA, SEVERE 01/13/2010   Essential hypertension 01/13/2010   Sleep apnea 01/13/2010   Left carotid bruit 01/13/2010   MIXED HEARING LOSS BILATERAL 02/16/2009    PCP: Norleen Agent MD  REFERRING PROVIDER: Joane Birmingham MD  REFERRING DIAG: [phone], G89.29 chronic bilateral low back pain with left sided sciatica; M43.00 pars defect  Rationale for Evaluation and Treatment: Rehabilitation  THERAPY DIAG:  Back pain; weakness  ONSET DATE: > 1 year  SUBJECTIVE:                                                                                                                                                                                           SUBJECTIVE STATEMENT: Doing good, no issues.  Did some cleaning the other day and that was  ok.   Lost 20# taking injections I play golf but I haven't swung a club in the last few months 2-3 blocks max walk   Has a gym where he lives but does not use  PERTINENT HISTORY:  L5-  S1 ESI 12/11; prior ESI several See cardiac history above HTN Vascular issues (may need stint in leg) Numbness in both feet On blood thinners Foot surgery 79 month old Grandtr in [place]  PAIN:  Are you having pain? no NPRS scale: 0/10  Pain location: bil low back pain and left LE especially knee to ankle; numbness in both feet Pain orientation: Bilateral  PAIN TYPE: aching Pain description: constant  Aggravating factors: my mattress (waken with low back pain); sleeping, walking Relieving factors: fetal position usually left side; sitting (sometimes relief, sometimes not)  PRECAUTIONS: None    WEIGHT BEARING RESTRICTIONS: No  FALLS:  Has patient fallen in last 6 months? No  OCCUPATION: part-time work 4-6 hours standing   PLOF: Independent with basic ADLs  PATIENT GOALS: I hope to loosen things up; maybe play golf; how can I alleviate the pain?;  go to the gym where I live  OBJECTIVE:  Note: Objective measures were completed at Evaluation unless otherwise noted.  DIAGNOSTIC FINDINGS:  MRI Nov '24 IMPRESSION: 1. 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 There is chronic severe foraminal narrowing bilaterally and probable chronic bilateral L5 nerve root encroachment. 2. Mild spondylosis at the other lumbar levels without significant spinal stenosis or nerve root encroachment. 3. No acute osseous findings.  PATIENT SURVEYS:  Modified Oswestry 40% pt left glasses in the car so questionnaire done verbally and was time consuming   COGNITION: Overall cognitive status: Within functional limits for tasks assessed     MUSCLE LENGTH: Moderate decrease in HS and hip flexor lengths bilaterally POSTURE: decreased lumbar  lordosis  PALPATION: Decreased fascial mobility in lumbar region bil  LUMBAR ROM:   AROM eval  Flexion 6 inches from floor  Extension 10 degrees  Right lateral flexion 20 degrees  Left lateral flexion 20 degrees  Right rotation   Left rotation    (Blank rows = not tested) TRUNK STRENGTH:  Decreased activation of transverse abdominus muscles; abdominals 4-/5; decreased activation of lumbar multifidi; trunk extensors 4-/5  LOWER EXTREMITY ROM:   grossly WFLs except decreased hip external rotation bil left more limited than right  LOWER EXTREMITY MMT:      MMT Right eval Left eval  Hip flexion 4+ 4+  Hip extension 4+ 4+  Hip abduction 4 4-  Hip adduction    Hip internal rotation    Hip external rotation    Knee flexion    Knee extension 4+ 4+  Ankle dorsiflexion    Ankle plantarflexion 4 4  Ankle inversion    Ankle eversion     (Blank rows = not tested)  LUMBAR SPECIAL TESTS:  Slump test: Negative  FUNCTIONAL TESTS:  Able to rise from the chair without UE assist Able to stoop to pick up small object with ease SLS < 3 sec bilaterally; much less stable on left with pelvic drop 09/12/23: 6 min walk test: 18- age related norm is 1745  GAIT:  Comments: WFLs for short distances   TREATMENT DATE:  10/03/23:  Nu-Step L5 10 min while discussing back, gym options  Doorway pec stretch/calf stretch and QL 5x each right/left Therapeutic activity for motions needed with golf, lifting Pink power cord diagonal extensions 10x each way (encouraged hip hinge) standing hamstring stretch on the 2nd step 3x20 seconds  Modified dead lift to knee level pair of 10# dumbbells 12x  Standing snatch/press overhead 5x right/left  Holding pair of 10# weights with step taps  10x 1/2 kneel on BOSU open books, added green band 10x each side Lat bar standing 40# 15x cues to engage lats  10/01/23:  2nd step hip flexor stretch 3 sets of 5 with UE reaches up and overhead right/left standing  hamstring stretch on the 2nd step 3x20 seconds  Open books with top leg on foam roll 10x right/left Quadruped foam roll thread the needle 10x right/left Standing red power cord golf simulation 1/2 backswing 12x each way Manual therapy: soft tissue mobilization lumbar musculature Nu-Step L5 5 min  Trigger Point Dry Needling Subsequent Treatment: Instructions reviewed, if requested by the patient, prior to subsequent dry needling treatment.  Patient Verbal Consent Given: Yes Education Handout Provided: Previously Provided Muscles Treated: bil lumbar multifidi and gluteals  Electrical Stimulation Performed: Yes, Parameters: 1.5 ma 80 pps 8 min to bil lumbar multifidi and gluteals Treatment Response/Outcome: twitch response and improved tissue mobility  Elongation and release to bil low back and gluteals  09/24/23:  Nu-Step L4x 8 minutes (old model)-PT present to discuss progress 2nd step hip flexor stretch 3 sets of 5 with UE reaches up and overhead right/left Seated hamstring stretch 3x20 seconds  Standing core strengthening series holding pair of 5 pound dumbbells while marching sets of 8 reps each:  1) Farmers hold; 2) single at the shoulder hold; 3) single overhead press hold  Seated trunk extension red power cord 25x Sit to stand holding 10# kettlebell 2x10 Standing bil 5# dumbbells modified hip hinge x15 (tactile cues for straight back) Hip flexor stretch at step: 5x10 seconds  Farmer's carry around building: 10# kettlebell Rt and Lt  Trigger Point Dry Needling Subsequent Treatment: Instructions reviewed, if requested by the patient, prior to subsequent dry needling treatment.  Patient Verbal Consent Given: Yes Education Handout Provided: Previously Provided Muscles Treated: bil lumbar multifidi and gluteals  Electrical Stimulation Performed: Yes, Parameters: 1.5 ma 80 pps 8 min to bil lumbar multifidi and gluteals Treatment Response/Outcome: twitch response and improved tissue  mobility  Elongation and release to bil low back and gluteals 09/20/23:  Nu-Step L5 new model 5 min 2nd step hip flexor stretch 3 sets of 5 with UE reaches up and overhead right/left 2nd step HS 5x right/left Standing core strengthening series holding pair of 5 pound dumbbells while marching sets of 8 reps each:  1) Farmers hold; 2) single at the shoulder hold; 3) single overhead press hold  Sit to stand holding 5# 8x; holding both 5# dumbbells at the shoulder 8x Red power cord extensions x20 Standing bil 5# dumbbells modified hip hinge 10x (verbal cues for forward gaze) Quadruped rock with crossed ankles and biased angles 10x Open books 10x right/left with foam roll PATIENT EDUCATION:  Education details: Access Code: [email or web address], Dry needling; home TENS info Person educated: Patient Education method: Explanation Education comprehension: verbalized understanding  HOME EXERCISE PROGRAM: Access Code: [email or web address] URL: [email or web address] Date: 09/20/2023 Prepared by: Glade Pesa  Exercises - Supine Lower Trunk Rotation  - 3 x daily - 7 x weekly - 1 sets - 3 reps - 20 hold - Hooklying Single Knee to Chest  - 3 x daily - 7 x weekly - 1 sets - 3 reps - 20 hold - Seated Hamstring Stretch  - 3 x daily - 7 x weekly - 1 sets - 3 reps - 20 hold - Seated Piriformis Stretch with Trunk Bend  - 3 x daily - 7 x weekly - 1 sets - 3 reps - 20 hold - Sit to  Stand with Arms Crossed  - 1 x daily - 7 x weekly - 1 sets - 10 reps - Standing Hamstring Stretch with Step  - 1 x daily - 7 x weekly - 1 sets - 5 reps - Standing Knee Flexion Stretch on Step  - 1 x daily - 7 x weekly - 1 sets - 5 reps - Half Deadlift with Kettlebell  - 1 x daily - 7 x weekly - 1 sets - 10 reps - Quadruped Rocking Backward  - 1 x daily - 7 x weekly - 1 sets - 10 reps - Sidelying Open Book Thoracic Rotation with Knee on Foam Roll  - 1 x daily - 7 x weekly - 1 sets - 10 reps  ASSESSMENT:  CLINICAL  IMPRESSION: Instruction given in hip hinging /dead lift technique needed for safe lifting capacity of household items and for posterior chain strengthening of lumbar and lower extremity musculature.  Verbal cues to keep the weight close to the body, hip and shoulders to rise together and for forward gaze needed for neutral spine alignment.   No back pain as long as he maintains forward gaze with lifting.  Improving trunk rotation noted, needed for golf.    OBJECTIVE IMPAIRMENTS: decreased activity tolerance, difficulty walking, decreased strength, increased fascial restrictions, impaired perceived functional ability, impaired flexibility, postural dysfunction, and pain.   ACTIVITY LIMITATIONS: carrying, lifting, sleeping, and locomotion level  PARTICIPATION LIMITATIONS: driving, community activity, and occupation  PERSONAL FACTORS: Past/current experiences, Time since onset of injury/illness/exacerbation, and 1-2 comorbidities: HTN; vascular issues   are also affecting patient's functional outcome.   REHAB POTENTIAL: Good  CLINICAL DECISION MAKING: Stable/uncomplicated  EVALUATION COMPLEXITY: Low   GOALS: Goals reviewed with patient? Yes  SHORT TERM GOALS: Target date: 10/09/2023    The patient will demonstrate knowledge of basic self care strategies and exercises to promote healing  Baseline: using stretching and activity modification to address symptoms (09/24/23) Goal status: MET  2.  The patient will report a 30% improvement in pain levels with functional activities which are currently difficult including sleeping and walking Baseline: 40% (09/24/23) Goal status: MET  3.  The patient will have improved trunk flexor and extensor muscle strength to at least 4/5 needed for lifting medium weight objects such as grocery bags, laundry and luggage  Baseline: 09/24/23 (09/24/23) Goal status: MET  4.  The patient will have improved left hip strength to at least 4+/5 needed for standing,  walking longer distances at home and in the community  Baseline:  Goal status: INITIAL    LONG TERM GOALS: Target date: 11/06/2023    The patient will be independent in a safe self progression of a home and/or gym exercise program to promote further recovery of function   Baseline:  Goal status: INITIAL  2.  The patient will report a 60% improvement in pain levels with functional activities which are currently difficult including sleeping and walking Baseline:  Goal status: INITIAL  3.  The patient will have improved trunk flexor and extensor muscle strength to at least 4+/5 needed for lifting his 53 month old granddaughter Baseline:  Goal status: INITIAL  4.  The patient will be able to walk 900 feet in 6 minutes with pain level <5/10 Baseline: 1007 with 5/10 bil leg pain Goal status: MET  5.  Modified Oswestry score improved to 31% indicating improved function with less pain Baseline:  Goal status: INITIAL   PLAN:  PT FREQUENCY: 2x/week  PT DURATION: 8  weeks  PLANNED INTERVENTIONS: 97164- PT Re-evaluation, 97110-Therapeutic exercises, 97530- Therapeutic activity, [id number]- Neuromuscular re-education, 97535- Self Care, 02859- Manual therapy, [id number]- Aquatic Therapy, 97014- Electrical stimulation (unattended), [id number]- Electrical stimulation (manual), [id number]- Ultrasound, [id number]- Traction (mechanical), [id number]- Ionotophoresis 4mg /ml Dexamethasone , Patient/Family education, Taping, Dry Needling, Joint mobilization, Spinal manipulation, Spinal mobilization, Cryotherapy, and Moist heat.  PLAN FOR NEXT SESSION:   DN/ES lumbar as needed, Nustep; core strengthening; hip hinge; squats, trunk mobility ex's for golf  Glade Pesa, PT 10/03/23 8:42 AM Phone: [phone] Fax: [phone]  Phone: [phone] Fax: [phone]

## 2023-10-07 ENCOUNTER — Other Ambulatory Visit (HOSPITAL_COMMUNITY): Payer: Self-pay

## 2023-10-07 ENCOUNTER — Telehealth: Payer: Self-pay | Admitting: Pharmacy Technician

## 2023-10-07 NOTE — Telephone Encounter (Signed)
 Pharmacy Patient Advocate Encounter   Received notification from CoverMyMeds that prior authorization for Mounjaro  5MG /0.5ML auto-injectors is required/requested.   Insurance verification completed.   The patient is insured through [facility] .   Per test claim: PA required; PA submitted to above mentioned insurance via CoverMyMeds Key/confirmation #/EOC [id number] Status is pending

## 2023-10-07 NOTE — Telephone Encounter (Signed)
 Pharmacy Patient Advocate Encounter  Received notification from [employer] that Prior Authorization for Mounjaro  5MG /0.5ML auto-injectors  has been APPROVED from 10/07/2023 to 08/26/2024. Ran test claim, Copay is $0.00. This test claim was processed through [facility]- copay amounts may vary at other pharmacies due to pharmacy/plan contracts, or as the patient moves through the different stages of their insurance plan.   PA #/Case ID/Reference #: [id number]

## 2023-10-08 ENCOUNTER — Ambulatory Visit: Payer: 59 | Admitting: Physical Therapy

## 2023-10-08 DIAGNOSIS — M6281 Muscle weakness (generalized): Secondary | ICD-10-CM

## 2023-10-08 DIAGNOSIS — M5432 Sciatica, left side: Secondary | ICD-10-CM

## 2023-10-08 NOTE — Patient Instructions (Signed)

## 2023-10-08 NOTE — Therapy (Signed)
OUTPATIENT PHYSICAL THERAPY  TREATMENT   Patient Name: Martin Archer MRN: [id number] DOB:06-09-1961, 63 y.o., male Today's Date: 10/08/2023  END OF SESSION:  PT End of Session - 10/08/23 0845     Visit Number 8    Date for PT Re-Evaluation 11/06/23    Authorization Type [employer]HC medicare/Medicaid secondary    Progress Note Due on Visit 10    PT Start Time 0843    PT Stop Time 0925    PT Time Calculation (min) 42 min    Activity Tolerance Patient tolerated treatment well               Past Medical History:  Diagnosis Date   Anemia, unspecified    Arthritis    Diverticulosis of colon (without mention of hemorrhage)    Gastric ulcer with hemorrhage and obstruction 2011   Dr Julien Girt [facility]   GERD (gastroesophageal reflux disease)    hx   Hyperlipemia    Hypertension    Personal history of colonic polyps 01/30/2010   hyperplastic rectal   Sleep apnea    on CPAP; [facility]   Stroke [facility]    mini strokes-bilateral carotid endarterectomy   Substance abuse (HCC)    History of alcohol abuse   Past Surgical History:  Procedure Laterality Date   AORTIC ARCH ANGIOGRAPHY N/A 04/26/2021   Procedure: AORTIC ARCH ANGIOGRAPHY;  Surgeon: Iran Ouch, MD;  Location: [facility];  Service: Cardiovascular;  Laterality: N/A;   CARDIAC CATHETERIZATION  01/25/2021   CAROTID ENDARTERECTOMY  11/2006   left Dr Jerilee Field   COLONOSCOPY  2011   DP-positive FOBT/IDA   ENDARTERECTOMY Right 01/19/2015   Procedure: RIGHT CAROTID ENDARTERECTOMY WITH PATCH ANGIOPLASTY;  Surgeon: Pryor Ochoa, MD;  Location: [facility] OR;  Service: Vascular;  Laterality: Right;   ENDARTERECTOMY Right 06/27/2021   Procedure: RIGHT REDO CAROTID ARTERY ENDARTERECTOMY & PLACEMENT OF 15Fr DRAIN;  Surgeon: Chuck Hint, MD;  Location: [facility] OR;  Service: Vascular;  Laterality: Right;   ESOPHAGOGASTRODUODENOSCOPY Left 06/17/2013   Procedure: ESOPHAGOGASTRODUODENOSCOPY (EGD);   Surgeon: Willis Modena, MD;  Location: [facility] ENDOSCOPY;  Service: Endoscopy;  Laterality: Left;   ESOPHAGOGASTRODUODENOSCOPY N/A 09/20/2015   Procedure: ESOPHAGOGASTRODUODENOSCOPY (EGD);  Surgeon: Meryl Dare, MD;  Location: [facility] ENDOSCOPY;  Service: Endoscopy;  Laterality: N/A;   FOOT SURGERY Bilateral 2020   Taylor's foot   INGUINAL HERNIA REPAIR  1963   NASAL FRACTURE SURGERY  1974   PATCH ANGIOPLASTY Right 06/27/2021   Procedure: PATCH ANGIOPLASTY USING Livia Snellen BIOLOGIC PATCH;  Surgeon: Chuck Hint, MD;  Location: [facility] OR;  Service: Vascular;  Laterality: Right;   PERIPHERAL VASCULAR INTERVENTION  04/26/2021   Procedure: PERIPHERAL VASCULAR INTERVENTION;  Surgeon: Iran Ouch, MD;  Location: [facility];  Service: Cardiovascular;;   RIGHT/LEFT HEART CATH AND CORONARY ANGIOGRAPHY N/A 01/25/2021   Procedure: RIGHT/LEFT HEART CATH AND CORONARY ANGIOGRAPHY;  Surgeon: Corky Crafts, MD;  Location: [facility] INVASIVE CV LAB;  Service: Cardiovascular;  Laterality: N/A;   TEE WITHOUT CARDIOVERSION N/A 01/17/2015   Procedure: TRANSESOPHAGEAL ECHOCARDIOGRAM (TEE);  Surgeon: Thurmon Fair, MD;  Location: [facility] ENDOSCOPY;  Service: Cardiovascular;  Laterality: N/A;   WISDOM TOOTH EXTRACTION     Patient Active Problem List   Diagnosis Date Noted   BMI 33.0-33.9,adult 03/08/2023   Erectile dysfunction 10/03/2022   OSA on CPAP 04/10/2022   Skin lesion of left arm 03/29/2022   Diabetes (HCC) 09/24/2021   Vitamin D deficiency 09/24/2021  Diarrhea 09/05/2021   Viral URI 09/05/2021   Sinusitis 09/05/2021   Carotid artery stenosis 06/27/2021   Intermittent pain and swelling of hand 05/29/2021   Stenosis of left subclavian artery (HCC) 04/26/2021   Chronic systolic heart failure (HCC)    Fatigue 12/19/2020   Dyspnea 12/19/2020   Spondylolisthesis of lumbosacral region 05/03/2020   CKD (chronic kidney disease) stage 4, GFR 15-29 ml/min (HCC) 09/26/2017   Syncope 09/26/2017    Elevated transaminase level 07/25/2017   Cannabis abuse 07/23/2017   Uncomplicated alcohol dependence (HCC) 07/23/2017   Melena    Gastric ulcer with hemorrhage    Right hemiparesis (HCC) 09/18/2015   Chronic blood loss anemia 09/18/2015   Hematemesis 09/18/2015   Encounter for well adult exam with abnormal findings 06/29/2015   History of CEA (carotid endarterectomy) 03/25/2015   HLD (hyperlipidemia) 03/25/2015   Tobacco use disorder 03/25/2015   Carotid artery occlusion with infarction (HCC) 03/25/2015   Carotid aneurysm, 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   Stroke (HCC)    Aortic arch atherosclerosis (HCC)    Carotid stenosis 01/16/2015   Thrombus: BILATERAL EXTERNAL JUGULAR VEINS per CT angio head and neck 01/16/15 01/16/2015   DVT (deep venous thrombosis) (HCC)    Numbness and tingling of left arm and leg 01/15/2015   Cerebral infarction (HCC)    Paresthesia 12/08/2014   Alcohol abuse, in remission 07/14/2013   ADD (attention deficit disorder) 07/14/2013   Nicotine dependence 06/18/2013   Back pain, chronic 06/17/2013   ACUTE GASTRIC ULCER W/HEMORRHAGE AND OBSTRUCTION 02/28/2010   History of colonic polyps 02/28/2010   DIVERTICULOSIS, COLON 02/02/2010   ANEMIA, SEVERE 01/13/2010   Essential hypertension 01/13/2010   Sleep apnea 01/13/2010   Left carotid bruit 01/13/2010   MIXED HEARING LOSS BILATERAL 02/16/2009    PCP: Oliver Barre MD  REFERRING PROVIDER: Clementeen Graham MD  REFERRING DIAG: [phone], G89.29 chronic bilateral low back pain with left sided sciatica; M43.00 pars defect  Rationale for Evaluation and Treatment: Rehabilitation  THERAPY DIAG:  Back pain; weakness  ONSET DATE: > 1 year  SUBJECTIVE:                                                                                                                                                                                           SUBJECTIVE STATEMENT: When I don't think about it it's good.  I felt it yesterday when I  twist and turned.   Lost 20# taking injections I play golf but I haven't swung a club in the last few months 2-3 blocks max walk   Has a gym where he lives but does not use  PERTINENT  HISTORY:  L5- S1 ESI 12/11; prior ESI "several" See cardiac history above HTN Vascular issues (may need stint in leg) Numbness in both feet On blood thinners Foot surgery 14 month old Grandtr in [place]  PAIN:  Are you having pain? no NPRS scale: 0/10  Pain location: bil low back pain and left LE especially knee to ankle; numbness in both feet Pain orientation: Bilateral  PAIN TYPE: aching Pain description: constant  Aggravating factors: my mattress (waken with low back pain); sleeping, walking Relieving factors: fetal position usually left side; sitting (sometimes relief, sometimes not)  PRECAUTIONS: None    WEIGHT BEARING RESTRICTIONS: No  FALLS:  Has patient fallen in last 6 months? No  OCCUPATION: part-time work 4-6 hours standing   PLOF: Independent with basic ADLs  PATIENT GOALS: I hope to loosen things up; maybe play golf; how can I alleviate the pain?;  go to the gym where I live  OBJECTIVE:  Note: Objective measures were completed at Evaluation unless otherwise noted.  DIAGNOSTIC FINDINGS:  MRI Nov '24 IMPRESSION: 1. 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 There is chronic severe foraminal narrowing bilaterally and probable chronic bilateral L5 nerve root encroachment. 2. Mild spondylosis at the other lumbar levels without significant spinal stenosis or nerve root encroachment. 3. No acute osseous findings.  PATIENT SURVEYS:  Modified Oswestry 40% pt left glasses in the car so questionnaire done verbally and was time consuming   COGNITION: Overall cognitive status: Within functional limits for tasks assessed     MUSCLE LENGTH: Moderate decrease in HS and hip flexor lengths bilaterally POSTURE:  decreased lumbar lordosis  PALPATION: Decreased fascial mobility in lumbar region bil  LUMBAR ROM:   AROM eval  Flexion 6 inches from floor  Extension 10 degrees  Right lateral flexion 20 degrees  Left lateral flexion 20 degrees  Right rotation   Left rotation    (Blank rows = not tested) TRUNK STRENGTH:  Decreased activation of transverse abdominus muscles; abdominals 4-/5; decreased activation of lumbar multifidi; trunk extensors 4-/5  LOWER EXTREMITY ROM:   grossly WFLs except decreased hip external rotation bil left more limited than right  LOWER EXTREMITY MMT:      MMT Right eval Left eval  Hip flexion 4+ 4+  Hip extension 4+ 4+  Hip abduction 4 4-  Hip adduction    Hip internal rotation    Hip external rotation    Knee flexion    Knee extension 4+ 4+  Ankle dorsiflexion    Ankle plantarflexion 4 4  Ankle inversion    Ankle eversion     (Blank rows = not tested)  LUMBAR SPECIAL TESTS:  Slump test: Negative  FUNCTIONAL TESTS:  Able to rise from the chair without UE assist Able to stoop to pick up small object with ease SLS < 3 sec bilaterally; much less stable on left with pelvic drop 09/12/23: 6 min walk test: 66- age related norm is 1745  GAIT:  Comments: WFLs for short distances   TREATMENT DATE:  10/08/23:  Nu-Step L3 10 min while discussing back and ex equipment options  Pt demo hip hinge no weights (good form) Quadruped rocking crossed ankles 15x Sidelying open books 10x right/left Goblet squats 15# 10x Dead lifts 30# bar just below knee level 10x Modified elbows/knees plank 10x 5 sec hold Therapeutic activity for motions needed with golf, lifting Home TENS info Manual therapy: soft tissue mobilization to lumbar and gluteal musculature  Trigger Point Dry Needling Subsequent Treatment: Instructions reviewed, if requested by the patient, prior to subsequent dry needling treatment.  Patient Verbal Consent Given: Yes Education Handout Provided:  Previously Provided Muscles Treated: bil lumbar multifidi and gluteals  Electrical Stimulation Performed: Yes, Parameters: 1.5 ma 80 pps 8 min to bil lumbar multifidi and gluteals Treatment Response/Outcome: twitch response and improved tissue mobility  Elongation and release to bil low back and gluteals  10/03/23:  Nu-Step L5 10 min while discussing back, gym options  Doorway pec stretch/calf stretch and QL 5x each right/left Therapeutic activity for motions needed with golf, lifting Pink power cord diagonal extensions 10x each way (encouraged hip hinge) standing hamstring stretch on the 2nd step 3x20 seconds  Modified dead lift to knee level pair of 10# dumbbells 12x  Standing snatch/press overhead 5x right/left  Holding pair of 10# weights with step taps 10x 1/2 kneel on BOSU open books, added green band 10x each side Lat bar standing 40# 15x cues to engage lats  10/01/23:  2nd step hip flexor stretch 3 sets of 5 with UE reaches up and overhead right/left standing hamstring stretch on the 2nd step 3x20 seconds  Open books with top leg on foam roll 10x right/left Quadruped foam roll thread the needle 10x right/left Standing red power cord golf simulation 1/2 backswing 12x each way Manual therapy: soft tissue mobilization lumbar musculature Nu-Step L5 5 min  Trigger Point Dry Needling Subsequent Treatment: Instructions reviewed, if requested by the patient, prior to subsequent dry needling treatment.  Patient Verbal Consent Given: Yes Education Handout Provided: Previously Provided Muscles Treated: bil lumbar multifidi and gluteals  Electrical Stimulation Performed: Yes, Parameters: 1.5 ma 80 pps 8 min to bil lumbar multifidi and gluteals Treatment Response/Outcome: twitch response and improved tissue mobility  Elongation and release to bil low back and gluteals  09/24/23:  Nu-Step L4x 8 minutes (old model)-PT present to discuss progress 2nd step hip flexor stretch 3 sets of 5 with UE  reaches up and overhead right/left Seated hamstring stretch 3x20 seconds  Standing core strengthening series holding pair of 5 pound dumbbells while marching sets of 8 reps each:  1) Farmers hold; 2) single at the shoulder hold; 3) single overhead press hold  Seated trunk extension red power cord 25x Sit to stand holding 10# kettlebell 2x10 Standing bil 5# dumbbells modified hip hinge x15 (tactile cues for straight back) Hip flexor stretch at step: 5x10 seconds  Farmer's carry around building: 10# kettlebell Rt and Lt  Trigger Point Dry Needling Subsequent Treatment: Instructions reviewed, if requested by the patient, prior to subsequent dry needling treatment.  Patient Verbal Consent Given: Yes Education Handout Provided: Previously Provided Muscles Treated: bil lumbar multifidi and gluteals  Electrical Stimulation Performed: Yes, Parameters: 1.5 ma 80 pps 8 min to bil lumbar multifidi and gluteals Treatment Response/Outcome: twitch response and improved tissue mobility  Elongation and release to bil low back and gluteals PATIENT EDUCATION:  Education details: Access Code: [email or web address], Dry needling; home TENS info Person educated: Patient Education method: Explanation Education comprehension: verbalized understanding  HOME EXERCISE PROGRAM: Access Code: [email or web address] URL: [email or web address] Date: 09/20/2023 Prepared by: Lavinia Sharps  Exercises - Supine Lower Trunk Rotation  - 3 x daily - 7 x weekly - 1 sets - 3 reps - 20 hold - Hooklying Single Knee to Chest  - 3 x daily - 7 x weekly - 1 sets - 3 reps - 20 hold - Seated Hamstring Stretch  - 3 x daily -  7 x weekly - 1 sets - 3 reps - 20 hold - Seated Piriformis Stretch with Trunk Bend  - 3 x daily - 7 x weekly - 1 sets - 3 reps - 20 hold - Sit to Stand with Arms Crossed  - 1 x daily - 7 x weekly - 1 sets - 10 reps - Standing Hamstring Stretch with Step  - 1 x daily - 7 x weekly - 1 sets - 5 reps - Standing Knee Flexion  Stretch on Step  - 1 x daily - 7 x weekly - 1 sets - 5 reps - Half Deadlift with Kettlebell  - 1 x daily - 7 x weekly - 1 sets - 10 reps - Quadruped Rocking Backward  - 1 x daily - 7 x weekly - 1 sets - 10 reps - Sidelying Open Book Thoracic Rotation with Knee on Foam Roll  - 1 x daily - 7 x weekly - 1 sets - 10 reps  ASSESSMENT:  CLINICAL IMPRESSION: Jesusita Oka is progressing well strengthening of lumbo/pelvic and hip musculature.  He demonstrates much improved technique with hip hinge/dead lifts and able to lift 30# from just below knee level. Fewer cues needed.  He responds very well with DN combined with ES which has kept his pain level very low the last few weeks.    OBJECTIVE IMPAIRMENTS: decreased activity tolerance, difficulty walking, decreased strength, increased fascial restrictions, impaired perceived functional ability, impaired flexibility, postural dysfunction, and pain.   ACTIVITY LIMITATIONS: carrying, lifting, sleeping, and locomotion level  PARTICIPATION LIMITATIONS: driving, community activity, and occupation  PERSONAL FACTORS: Past/current experiences, Time since onset of injury/illness/exacerbation, and 1-2 comorbidities: HTN; vascular issues   are also affecting patient's functional outcome.   REHAB POTENTIAL: Good  CLINICAL DECISION MAKING: Stable/uncomplicated  EVALUATION COMPLEXITY: Low   GOALS: Goals reviewed with patient? Yes  SHORT TERM GOALS: Target date: 10/09/2023    The patient will demonstrate knowledge of basic self care strategies and exercises to promote healing  Baseline: using stretching and activity modification to address symptoms (09/24/23) Goal status: MET  2.  The patient will report a 30% improvement in pain levels with functional activities which are currently difficult including sleeping and walking Baseline: 40% (09/24/23) Goal status: MET  3.  The patient will have improved trunk flexor and extensor muscle strength to at least 4/5 needed  for lifting medium weight objects such as grocery bags, laundry and luggage  Baseline: 09/24/23 (09/24/23) Goal status: MET  4.  The patient will have improved left hip strength to at least 4+/5 needed for standing, walking longer distances at home and in the community  Baseline:  Goal status: INITIAL    LONG TERM GOALS: Target date: 11/06/2023    The patient will be independent in a safe self progression of a home and/or gym exercise program to promote further recovery of function   Baseline:  Goal status: INITIAL  2.  The patient will report a 60% improvement in pain levels with functional activities which are currently difficult including sleeping and walking Baseline:  Goal status: INITIAL  3.  The patient will have improved trunk flexor and extensor muscle strength to at least 4+/5 needed for lifting his 48 month old granddaughter Baseline:  Goal status: INITIAL  4.  The patient will be able to walk 900 feet in 6 minutes with pain level <5/10 Baseline: 1007 with 5/10 bil leg pain Goal status: MET  5.  Modified Oswestry score improved to 31% indicating improved  function with less pain Baseline:  Goal status: INITIAL   PLAN:  PT FREQUENCY: 2x/week  PT DURATION: 8 weeks  PLANNED INTERVENTIONS: 97164- PT Re-evaluation, 97110-Therapeutic exercises, 97530- Therapeutic activity, 97112- Neuromuscular re-education, 97535- Self Care, 16109- Manual therapy, [id number]- Aquatic Therapy, 97014- Electrical stimulation (unattended), [id number]- Electrical stimulation (manual), [id number]- Ultrasound, [id number]- Traction (mechanical), [id number]- Ionotophoresis 4mg /ml Dexamethasone, Patient/Family education, Taping, Dry Needling, Joint mobilization, Spinal manipulation, Spinal mobilization, Cryotherapy, and Moist heat.  PLAN FOR NEXT SESSION:   DN/ES lumbar as needed, Nustep; core strengthening; hip hinge; squats, trunk mobility ex's for golf; pt may purchase home TENS and will bring in for instruction if  needed  Lavinia Sharps, PT 10/08/23 10:10 AM Phone: [phone] Fax: [phone]

## 2023-10-09 ENCOUNTER — Other Ambulatory Visit (HOSPITAL_COMMUNITY): Payer: Self-pay

## 2023-10-10 ENCOUNTER — Encounter: Payer: 59 | Admitting: Physical Therapy

## 2023-10-14 ENCOUNTER — Ambulatory Visit: Payer: 59

## 2023-10-14 DIAGNOSIS — M5432 Sciatica, left side: Secondary | ICD-10-CM | POA: Diagnosis not present

## 2023-10-14 DIAGNOSIS — M6281 Muscle weakness (generalized): Secondary | ICD-10-CM

## 2023-10-14 NOTE — Therapy (Signed)
OUTPATIENT PHYSICAL THERAPY  TREATMENT   Patient Name: Martin Archer MRN: [id number] DOB:01-05-1961, 63 y.o., male Today's Date: 10/14/2023  END OF SESSION:  PT End of Session - 10/14/23 0930     Visit Number 9    Date for PT Re-Evaluation 11/06/23    Authorization Type UHC medicare/Medicaid secondary    Progress Note Due on Visit 10    PT Start Time 0845    PT Stop Time 0930    PT Time Calculation (min) 45 min    Activity Tolerance Patient tolerated treatment well    Behavior During Therapy [facility] for tasks assessed/performed                Past Medical History:  Diagnosis Date   Anemia, unspecified    Arthritis    Diverticulosis of colon (without mention of hemorrhage)    Gastric ulcer with hemorrhage and obstruction 2011   Dr Julien Girt GI   GERD (gastroesophageal reflux disease)    hx   Hyperlipemia    Hypertension    Personal history of colonic polyps 01/30/2010   hyperplastic rectal   Sleep apnea    on CPAP; [facility]   Stroke [facility])    mini strokes-bilateral carotid endarterectomy   Substance abuse (HCC)    History of alcohol abuse   Past Surgical History:  Procedure Laterality Date   AORTIC ARCH ANGIOGRAPHY N/A 04/26/2021   Procedure: AORTIC ARCH ANGIOGRAPHY;  Surgeon: Iran Ouch, MD;  Location: [facility] INVASIVE CV LAB;  Service: Cardiovascular;  Laterality: N/A;   CARDIAC CATHETERIZATION  01/25/2021   CAROTID ENDARTERECTOMY  11/2006   left Dr Jerilee Field   COLONOSCOPY  2011   DP-positive FOBT/IDA   ENDARTERECTOMY Right 01/19/2015   Procedure: RIGHT CAROTID ENDARTERECTOMY WITH PATCH ANGIOPLASTY;  Surgeon: Pryor Ochoa, MD;  Location[facility] OR;  Service: Vascular;  Laterality: Right;   ENDARTERECTOMY Right 06/27/2021   Procedure: RIGHT REDO CAROTID ARTERY ENDARTERECTOMY & PLACEMENT OF 15Fr DRAIN;  Surgeon: Chuck Hint, MD;  Location: [facility] OR;  Service: Vascular;  Laterality: Right;   ESOPHAGOGASTRODUODENOSCOPY Left  06/17/2013   Procedure: ESOPHAGOGASTRODUODENOSCOPY (EGD);  Surgeon: Willis Modena, MD;  Location: [facility] ENDOSCOPY;  Service: Endoscopy;  Laterality: Left;   ESOPHAGOGASTRODUODENOSCOPY N/A 09/20/2015   Procedure: ESOPHAGOGASTRODUODENOSCOPY (EGD);  Surgeon: Meryl Dare, MD;  Location: [facility] ENDOSCOPY;  Service: Endoscopy;  Laterality: N/A;   FOOT SURGERY Bilateral 2020   Taylor's foot   INGUINAL HERNIA REPAIR  1963   NASAL FRACTURE SURGERY  1974   PATCH ANGIOPLASTY Right 06/27/2021   Procedure: PATCH ANGIOPLASTY USING Livia Snellen BIOLOGIC PATCH;  Surgeon: Chuck Hint, MD;  Location: [facility] OR;  Service: Vascular;  Laterality: Right;   PERIPHERAL VASCULAR INTERVENTION  04/26/2021   Procedure: PERIPHERAL VASCULAR INTERVENTION;  Surgeon: Iran Ouch, MD;  Location: [facility] INVASIVE CV LAB;  Service: Cardiovascular;;   RIGHT/LEFT HEART CATH AND CORONARY ANGIOGRAPHY N/A 01/25/2021   Procedure: RIGHT/LEFT HEART CATH AND CORONARY ANGIOGRAPHY;  Surgeon: Corky Crafts, MD;  Location: [facility] INVASIVE CV LAB;  Service: Cardiovascular;  Laterality: N/A;   TEE WITHOUT CARDIOVERSION N/A 01/17/2015   Procedure: TRANSESOPHAGEAL ECHOCARDIOGRAM (TEE);  Surgeon: Thurmon Fair, MD;  Location: [facility] ENDOSCOPY;  Service: Cardiovascular;  Laterality: N/A;   WISDOM TOOTH EXTRACTION     Patient Active Problem List   Diagnosis Date Noted   BMI 33.0-33.9,adult 03/08/2023   Erectile dysfunction 10/03/2022   OSA on CPAP 04/10/2022   Skin lesion of left arm 03/29/2022  Diabetes (HCC) 09/24/2021   Vitamin D deficiency 09/24/2021   Diarrhea 09/05/2021   Viral URI 09/05/2021   Sinusitis 09/05/2021   Carotid artery stenosis 06/27/2021   Intermittent pain and swelling of hand 05/29/2021   Stenosis of left subclavian artery (HCC) 04/26/2021   Chronic systolic heart failure (HCC)    Fatigue 12/19/2020   Dyspnea 12/19/2020   Spondylolisthesis of lumbosacral region 05/03/2020   CKD (chronic kidney disease) stage 4,  GFR 15-29 ml/min (HCC) 09/26/2017   Syncope 09/26/2017   Elevated transaminase level 07/25/2017   Cannabis abuse 07/23/2017   Uncomplicated alcohol dependence (HCC) 07/23/2017   Melena    Gastric ulcer with hemorrhage    Right hemiparesis (HCC) 09/18/2015   Chronic blood loss anemia 09/18/2015   Hematemesis 09/18/2015   Encounter for well adult exam with abnormal findings 06/29/2015   History of CEA (carotid endarterectomy) 03/25/2015   HLD (hyperlipidemia) 03/25/2015   Tobacco use disorder 03/25/2015   Carotid artery occlusion with infarction (HCC) 03/25/2015   Carotid aneurysm, 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   Stroke (HCC)    Aortic arch atherosclerosis (HCC)    Carotid stenosis 01/16/2015   Thrombus: BILATERAL EXTERNAL JUGULAR VEINS per CT angio head and neck 01/16/15 01/16/2015   DVT (deep venous thrombosis) (HCC)    Numbness and tingling of left arm and leg 01/15/2015   Cerebral infarction (HCC)    Paresthesia 12/08/2014   Alcohol abuse, in remission 07/14/2013   ADD (attention deficit disorder) 07/14/2013   Nicotine dependence 06/18/2013   Back pain, chronic 06/17/2013   ACUTE GASTRIC ULCER W/HEMORRHAGE AND OBSTRUCTION 02/28/2010   History of colonic polyps 02/28/2010   DIVERTICULOSIS, COLON 02/02/2010   ANEMIA, SEVERE 01/13/2010   Essential hypertension 01/13/2010   Sleep apnea 01/13/2010   Left carotid bruit 01/13/2010   MIXED HEARING LOSS BILATERAL 02/16/2009    PCP: Oliver Barre MD  REFERRING PROVIDER: Clementeen Graham MD  REFERRING DIAG: [phone], G89.29 chronic bilateral low back pain with left sided sciatica; M43.00 pars defect  Rationale for Evaluation and Treatment: Rehabilitation  THERAPY DIAG:  Back pain; weakness  ONSET DATE: > 1 year  SUBJECTIVE:                                                                                                                                                                                           SUBJECTIVE STATEMENT: I haven't  had much back pain.  My pain is 80-90% better overall.   Lost 20# taking injections I play golf but I haven't swung a club in the last few months 2-3 blocks max walk   Has a gym where he lives  but does not use  PERTINENT HISTORY:  L5- S1 ESI 12/11; prior ESI "several" See cardiac history above HTN Vascular issues (may need stint in leg) Numbness in both feet On blood thinners Foot surgery 52 month old Grandtr in [place]  PAIN:  Are you having pain? no NPRS scale: 0/10  Pain location: bil low back pain and left LE especially knee to ankle; numbness in both feet Pain orientation: Bilateral  PAIN TYPE: aching Pain description: constant  Aggravating factors: my mattress (waken with low back pain); sleeping, walking Relieving factors: fetal position usually left side; sitting (sometimes relief, sometimes not)  PRECAUTIONS: None    WEIGHT BEARING RESTRICTIONS: No  FALLS:  Has patient fallen in last 6 months? No  OCCUPATION: part-time work 4-6 hours standing   PLOF: Independent with basic ADLs  PATIENT GOALS: I hope to loosen things up; maybe play golf; how can I alleviate the pain?;  go to the gym where I live  OBJECTIVE:  Note: Objective measures were completed at Evaluation unless otherwise noted.  DIAGNOSTIC FINDINGS:  MRI Nov '24 IMPRESSION: 1. 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 There is chronic severe foraminal narrowing bilaterally and probable chronic bilateral L5 nerve root encroachment. 2. Mild spondylosis at the other lumbar levels without significant spinal stenosis or nerve root encroachment. 3. No acute osseous findings.  PATIENT SURVEYS:  Modified Oswestry 40% pt left glasses in the car so questionnaire done verbally and was time consuming  10/14/23: Modified Oswestry 2%   COGNITION: Overall cognitive status: Within functional limits for tasks assessed     MUSCLE  LENGTH: Moderate decrease in HS and hip flexor lengths bilaterally POSTURE: decreased lumbar lordosis  PALPATION: Decreased fascial mobility in lumbar region bil  LUMBAR ROM:   AROM eval  Flexion 6 inches from floor  Extension 10 degrees  Right lateral flexion 20 degrees  Left lateral flexion 20 degrees  Right rotation   Left rotation    (Blank rows = not tested) TRUNK STRENGTH:  Decreased activation of transverse abdominus muscles; abdominals 4-/5; decreased activation of lumbar multifidi; trunk extensors 4-/5  LOWER EXTREMITY ROM:   grossly WFLs except decreased hip external rotation bil left more limited than right  LOWER EXTREMITY MMT:      MMT Right eval Left eval  Hip flexion 4+ 4+  Hip extension 4+ 4+  Hip abduction 4 4-  Hip adduction    Hip internal rotation    Hip external rotation    Knee flexion    Knee extension 4+ 4+  Ankle dorsiflexion    Ankle plantarflexion 4 4  Ankle inversion    Ankle eversion     (Blank rows = not tested)  LUMBAR SPECIAL TESTS:  Slump test: Negative  FUNCTIONAL TESTS:  Able to rise from the chair without UE assist Able to stoop to pick up small object with ease SLS < 3 sec bilaterally; much less stable on left with pelvic drop 09/12/23: 6 min walk test: 21- age related norm is 1745  GAIT:  Comments: WFLs for short distances   TREATMENT DATE:  10/14/23:  Nu-Step L3 10 min while discussing back and ex equipment options  Walking in reverse: 15# x10 Ball roll outs: 3 ways 5" hold x5 each Quadruped rocking crossed ankles 15x Sidelying open books 10x right/left Goblet squats 15# 2x10 Farmer's carry: 10#- 1 lap around the building in each arm Dead lifts 30# bar just below knee level 10x-verbal  cues for breathing  10/08/23:  Nu-Step L3 10 min while discussing back and ex equipment options  Pt demo hip hinge no weights (good form) Quadruped rocking crossed ankles 15x Sidelying open books 10x right/left Goblet squats 15#  10x Dead lifts 30# bar just below knee level 10x Modified elbows/knees plank 10x 5 sec hold Therapeutic activity for motions needed with golf, lifting Seated hamstring stretch 2x20 seconds  Manual therapy: soft tissue mobilization to lumbar and gluteal musculature Trigger Point Dry Needling Subsequent Treatment: Instructions reviewed, if requested by the patient, prior to subsequent dry needling treatment.  Patient Verbal Consent Given: Yes Education Handout Provided: Previously Provided Muscles Treated: bil lumbar multifidi and gluteals  Electrical Stimulation Performed: Yes, Parameters: 1.5 ma 80 pps 8 min to bil lumbar multifidi and gluteals Treatment Response/Outcome: twitch response and improved tissue mobility  Elongation and release to bil low back and gluteals  10/03/23:  Nu-Step L5 10 min while discussing back, gym options  Doorway pec stretch/calf stretch and QL 5x each right/left Therapeutic activity for motions needed with golf, lifting Pink power cord diagonal extensions 10x each way (encouraged hip hinge) standing hamstring stretch on the 2nd step 3x20 seconds  Modified dead lift to knee level pair of 10# dumbbells 12x  Standing snatch/press overhead 5x right/left  Holding pair of 10# weights with step taps 10x 1/2 kneel on BOSU open books, added green band 10x each side Lat bar standing 40# 15x cues to engage lats  PATIENT EDUCATION:  Education details: Access Code: [email or web address], Dry needling; home TENS info Person educated: Patient Education method: Explanation Education comprehension: verbalized understanding  HOME EXERCISE PROGRAM: Access Code: [email or web address] URL: [email or web address] Date: 09/20/2023 Prepared by: Lavinia Sharps  Exercises - Supine Lower Trunk Rotation  - 3 x daily - 7 x weekly - 1 sets - 3 reps - 20 hold - Hooklying Single Knee to Chest  - 3 x daily - 7 x weekly - 1 sets - 3 reps - 20 hold - Seated Hamstring Stretch  - 3 x daily - 7 x  weekly - 1 sets - 3 reps - 20 hold - Seated Piriformis Stretch with Trunk Bend  - 3 x daily - 7 x weekly - 1 sets - 3 reps - 20 hold - Sit to Stand with Arms Crossed  - 1 x daily - 7 x weekly - 1 sets - 10 reps - Standing Hamstring Stretch with Step  - 1 x daily - 7 x weekly - 1 sets - 5 reps - Standing Knee Flexion Stretch on Step  - 1 x daily - 7 x weekly - 1 sets - 5 reps - Half Deadlift with Kettlebell  - 1 x daily - 7 x weekly - 1 sets - 10 reps - Quadruped Rocking Backward  - 1 x daily - 7 x weekly - 1 sets - 10 reps - Sidelying Open Book Thoracic Rotation with Knee on Foam Roll  - 1 x daily - 7 x weekly - 1 sets - 10 reps  ASSESSMENT:  CLINICAL IMPRESSION: Pt reports 80-90% overall pain reduction since the start of care.  He is mostly stretching at home and has not started going to the gym.  He stands for work and reports that he has not had much difficulty with this.  Modified Oswestry is improved to 2% disability-meeting goal.  Pt is lifting heavy objects at work with minimal to no pain.  Patient will benefit from skilled PT to address the below impairments  and improve overall function.   OBJECTIVE IMPAIRMENTS: decreased activity tolerance, difficulty walking, decreased strength, increased fascial restrictions, impaired perceived functional ability, impaired flexibility, postural dysfunction, and pain.   ACTIVITY LIMITATIONS: carrying, lifting, sleeping, and locomotion level  PARTICIPATION LIMITATIONS: driving, community activity, and occupation  PERSONAL FACTORS: Past/current experiences, Time since onset of injury/illness/exacerbation, and 1-2 comorbidities: HTN; vascular issues   are also affecting patient's functional outcome.   REHAB POTENTIAL: Good  CLINICAL DECISION MAKING: Stable/uncomplicated  EVALUATION COMPLEXITY: Low   GOALS: Goals reviewed with patient? Yes  SHORT TERM GOALS: Target date: 10/09/2023    The patient will demonstrate knowledge of basic self care  strategies and exercises to promote healing  Baseline: using stretching and activity modification to address symptoms (09/24/23) Goal status: MET  2.  The patient will report a 30% improvement in pain levels with functional activities which are currently difficult including sleeping and walking Baseline: 40% (09/24/23) Goal status: MET  3.  The patient will have improved trunk flexor and extensor muscle strength to at least 4/5 needed for lifting medium weight objects such as grocery bags, laundry and luggage  Baseline: 09/24/23 (09/24/23) Goal status: MET  4.  The patient will have improved left hip strength to at least 4+/5 needed for standing, walking longer distances at home and in the community  Baseline:  Goal status: INITIAL    LONG TERM GOALS: Target date: 11/06/2023    The patient will be independent in a safe self progression of a home and/or gym exercise program to promote further recovery of function   Baseline:  Goal status: INITIAL  2.  The patient will report a 60% improvement in pain levels with functional activities which are currently difficult including sleeping and walking Baseline: 80-90% (10/14/23) Goal status: MET  3.  The patient will have improved trunk flexor and extensor muscle strength to at least 4+/5 needed for lifting his 81 month old granddaughter Baseline:  Goal status: INITIAL  4.  The patient will be able to walk 900 feet in 6 minutes with pain level <5/10 Baseline: 1007 with 5/10 bil leg pain Goal status: MET  5.  Modified Oswestry score improved to 31% indicating improved function with less pain Baseline: 2% (10/14/23) Goal status: MET   PLAN:  PT FREQUENCY: 2x/week  PT DURATION: 8 weeks  PLANNED INTERVENTIONS: 97164- PT Re-evaluation, 97110-Therapeutic exercises, 97530- Therapeutic activity, 97112- Neuromuscular re-education, 97535- Self Care, 16109- Manual therapy, [id number]- Aquatic Therapy, 97014- Electrical stimulation (unattended),  [id number]- Electrical stimulation (manual), [id number]- Ultrasound, 60454- Traction (mechanical), [id number]- Ionotophoresis 4mg /ml Dexamethasone, Patient/Family education, Taping, Dry Needling, Joint mobilization, Spinal manipulation, Spinal mobilization, Cryotherapy, and Moist heat.  PLAN FOR NEXT SESSION:   DN/ES lumbar as needed, Nustep; core strengthening; hip hinge; squats, trunk mobility ex's for golf; 10th visit next  Lorrene Reid, PT 10/14/23 9:38 AM  Phone: [phone] Fax: [phone]

## 2023-10-15 ENCOUNTER — Ambulatory Visit: Payer: 59 | Admitting: Cardiovascular Disease

## 2023-10-17 ENCOUNTER — Ambulatory Visit: Payer: 59

## 2023-10-21 ENCOUNTER — Ambulatory Visit (HOSPITAL_COMMUNITY)
Admission: RE | Admit: 2023-10-21 | Discharge: 2023-10-21 | Disposition: A | Discharge status: Home / Self Care | Payer: 59 | Source: Ambulatory Visit | Attending: Cardiology | Admitting: Cardiology

## 2023-10-21 DIAGNOSIS — I2510 Atherosclerotic heart disease of native coronary artery without angina pectoris: Secondary | ICD-10-CM | POA: Diagnosis not present

## 2023-10-21 DIAGNOSIS — I5022 Chronic systolic (congestive) heart failure: Secondary | ICD-10-CM | POA: Diagnosis present

## 2023-10-21 DIAGNOSIS — I739 Peripheral vascular disease, unspecified: Secondary | ICD-10-CM | POA: Diagnosis present

## 2023-10-21 DIAGNOSIS — E785 Hyperlipidemia, unspecified: Secondary | ICD-10-CM | POA: Diagnosis present

## 2023-10-21 DIAGNOSIS — I771 Stricture of artery: Secondary | ICD-10-CM | POA: Diagnosis not present

## 2023-10-21 LAB — ECHOCARDIOGRAM COMPLETE
AR max vel: 1.71 cm2
AV Area VTI: 1.63 cm2
AV Area mean vel: 1.55 cm2
AV Mean grad: 7.6 mm[Hg]
AV Peak grad: 12.1 mm[Hg]
Ao pk vel: 1.74 m/s
Area-P 1/2: 4.1 cm2
Est EF: 58
MV M vel: 2 m/s
MV Peak grad: 16 mm[Hg]
S' Lateral: 3.56 cm

## 2023-10-22 ENCOUNTER — Ambulatory Visit: Payer: 59 | Admitting: Physical Therapy

## 2023-10-22 DIAGNOSIS — M5432 Sciatica, left side: Secondary | ICD-10-CM | POA: Diagnosis not present

## 2023-10-22 DIAGNOSIS — M6281 Muscle weakness (generalized): Secondary | ICD-10-CM

## 2023-10-22 NOTE — Therapy (Signed)
 OUTPATIENT PHYSICAL THERAPY  TREATMENT   Patient Name: Martin Archer MRN: [id number] DOB:1961-06-13, 63 y.o., male Today's Date: 10/22/2023  Progress Note Reporting Period 08/31/23 to 10/22/23  See note below for Objective Data and Assessment of Progress/Goals.     END OF SESSION:  PT End of Session - 10/22/23 0842     Visit Number 10    Date for PT Re-Evaluation 11/06/23    Authorization Type [employer]HC medicare/Medicaid secondary    PT Start Time 0845    PT Stop Time 0925    PT Time Calculation (min) 40 min    Activity Tolerance Patient tolerated treatment well                Past Medical History:  Diagnosis Date   Anemia, unspecified    Arthritis    Diverticulosis of colon (without mention of hemorrhage)    Gastric ulcer with hemorrhage and obstruction 2011   Dr Julien Girt [facility]   GERD (gastroesophageal reflux disease)    hx   Hyperlipemia    Hypertension    Personal history of colonic polyps 01/30/2010   hyperplastic rectal   Sleep apnea    on CPAP; [facility]   Stroke [facility])    mini strokes-bilateral carotid endarterectomy   Substance abuse (HCC)    History of alcohol abuse   Past Surgical History:  Procedure Laterality Date   AORTIC ARCH ANGIOGRAPHY N/A 04/26/2021   Procedure: AORTIC ARCH ANGIOGRAPHY;  Surgeon: Iran Ouch, MD;  Location: [facility] INVASIVE CV LAB;  Service: Cardiovascular;  Laterality: N/A;   CARDIAC CATHETERIZATION  01/25/2021   CAROTID ENDARTERECTOMY  11/2006   left Dr Jerilee Field   COLONOSCOPY  2011   DP-positive FOBT/IDA   ENDARTERECTOMY Right 01/19/2015   Procedure: RIGHT CAROTID ENDARTERECTOMY WITH PATCH ANGIOPLASTY;  Surgeon: Pryor Ochoa, MD;  Location: [facility] OR;  Service: Vascular;  Laterality: Right;   ENDARTERECTOMY Right 06/27/2021   Procedure: RIGHT REDO CAROTID ARTERY ENDARTERECTOMY & PLACEMENT OF 15Fr DRAIN;  Surgeon: Chuck Hint, MD;  Location: [facility] OR;  Service: Vascular;  Laterality: Right;    ESOPHAGOGASTRODUODENOSCOPY Left 06/17/2013   Procedure: ESOPHAGOGASTRODUODENOSCOPY (EGD);  Surgeon: Willis Modena, MD;  Location: [facility] ENDOSCOPY;  Service: Endoscopy;  Laterality: Left;   ESOPHAGOGASTRODUODENOSCOPY N/A 09/20/2015   Procedure: ESOPHAGOGASTRODUODENOSCOPY (EGD);  Surgeon: Meryl Dare, MD;  Location: [facility] ENDOSCOPY;  Service: Endoscopy;  Laterality: N/A;   FOOT SURGERY Bilateral 2020   Taylor's foot   INGUINAL HERNIA REPAIR  1963   NASAL FRACTURE SURGERY  1974   PATCH ANGIOPLASTY Right 06/27/2021   Procedure: PATCH ANGIOPLASTY USING Livia Snellen BIOLOGIC PATCH;  Surgeon: Chuck Hint, MD;  Location: [facility] OR;  Service: Vascular;  Laterality: Right;   PERIPHERAL VASCULAR INTERVENTION  04/26/2021   Procedure: PERIPHERAL VASCULAR INTERVENTION;  Surgeon: Iran Ouch, MD;  Location: [facility] INVASIVE CV LAB;  Service: Cardiovascular;;   RIGHT/LEFT HEART CATH AND CORONARY ANGIOGRAPHY N/A 01/25/2021   Procedure: RIGHT/LEFT HEART CATH AND CORONARY ANGIOGRAPHY;  Surgeon: Corky Crafts, MD;  Location: [facility] INVASIVE CV LAB;  Service: Cardiovascular;  Laterality: N/A;   TEE WITHOUT CARDIOVERSION N/A 01/17/2015   Procedure: TRANSESOPHAGEAL ECHOCARDIOGRAM (TEE);  Surgeon: Thurmon Fair, MD;  Location: [facility] ENDOSCOPY;  Service: Cardiovascular;  Laterality: N/A;   WISDOM TOOTH EXTRACTION     Patient Active Problem List   Diagnosis Date Noted   BMI 33.0-33.9,adult 03/08/2023   Erectile dysfunction 10/03/2022   OSA on CPAP 04/10/2022   Skin lesion of  left arm 03/29/2022   Diabetes (HCC) 09/24/2021   Vitamin D deficiency 09/24/2021   Diarrhea 09/05/2021   Viral URI 09/05/2021   Sinusitis 09/05/2021   Carotid artery stenosis 06/27/2021   Intermittent pain and swelling of hand 05/29/2021   Stenosis of left subclavian artery (HCC) 04/26/2021   Chronic systolic heart failure (HCC)    Fatigue 12/19/2020   Dyspnea 12/19/2020   Spondylolisthesis of lumbosacral region 05/03/2020   CKD  (chronic kidney disease) stage 4, GFR 15-29 ml/min (HCC) 09/26/2017   Syncope 09/26/2017   Elevated transaminase level 07/25/2017   Cannabis abuse 07/23/2017   Uncomplicated alcohol dependence (HCC) 07/23/2017   Melena    Gastric ulcer with hemorrhage    Right hemiparesis (HCC) 09/18/2015   Chronic blood loss anemia 09/18/2015   Hematemesis 09/18/2015   Encounter for well adult exam with abnormal findings 06/29/2015   History of CEA (carotid endarterectomy) 03/25/2015   HLD (hyperlipidemia) 03/25/2015   Tobacco use disorder 03/25/2015   Carotid artery occlusion with infarction (HCC) 03/25/2015   Carotid aneurysm, 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   Stroke (HCC)    Aortic arch atherosclerosis (HCC)    Carotid stenosis 01/16/2015   Thrombus: BILATERAL EXTERNAL JUGULAR VEINS per CT angio head and neck 01/16/15 01/16/2015   DVT (deep venous thrombosis) (HCC)    Numbness and tingling of left arm and leg 01/15/2015   Cerebral infarction (HCC)    Paresthesia 12/08/2014   Alcohol abuse, in remission 07/14/2013   ADD (attention deficit disorder) 07/14/2013   Nicotine dependence 06/18/2013   Back pain, chronic 06/17/2013   ACUTE GASTRIC ULCER W/HEMORRHAGE AND OBSTRUCTION 02/28/2010   History of colonic polyps 02/28/2010   DIVERTICULOSIS, COLON 02/02/2010   ANEMIA, SEVERE 01/13/2010   Essential hypertension 01/13/2010   Sleep apnea 01/13/2010   Left carotid bruit 01/13/2010   MIXED HEARING LOSS BILATERAL 02/16/2009    PCP: Oliver Barre MD  REFERRING PROVIDER: Clementeen Graham MD  REFERRING DIAG: [phone], G89.29 chronic bilateral low back pain with left sided sciatica; M43.00 pars defect  Rationale for Evaluation and Treatment: Rehabilitation  THERAPY DIAG:  Back pain; weakness  ONSET DATE: > 1 year  SUBJECTIVE:                                                                                                                                                                                            SUBJECTIVE STATEMENT: My back is doing OK.  I haven't tested my back, only played 1 round (of golf). I might do some chipping and putting today.  I should probably stretch every day.  Had a heart scan yesterday and the  results came back good.  Has another heart test on Monday.    Lost 20# taking injections I play golf but I haven't swung a club in the last few months 2-3 blocks max walk   Has a gym where he lives but does not use  PERTINENT HISTORY:  L5- S1 ESI 12/11; prior ESI "several" See cardiac history above HTN Vascular issues (may need stint in leg) Numbness in both feet On blood thinners Foot surgery 87 month old Grandtr in [place]  PAIN:  Are you having pain? no NPRS scale: 0/10, not consistent, just some tightness Pain location: bil low back pain and left LE especially knee to ankle; numbness in both feet Pain orientation: Bilateral  PAIN TYPE: aching Pain description: constant  Aggravating factors: my mattress (waken with low back pain); sleeping, walking Relieving factors: fetal position usually left side; sitting (sometimes relief, sometimes not)  PRECAUTIONS: None    WEIGHT BEARING RESTRICTIONS: No  FALLS:  Has patient fallen in last 6 months? No  OCCUPATION: part-time work 4-6 hours standing   PLOF: Independent with basic ADLs  PATIENT GOALS: I hope to loosen things up; maybe play golf; how can I alleviate the pain?;  go to the gym where I live  OBJECTIVE:  Note: Objective measures were completed at Evaluation unless otherwise noted.  DIAGNOSTIC FINDINGS:  MRI Nov '24 IMPRESSION: 1. 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 There is chronic severe foraminal narrowing bilaterally and probable chronic bilateral L5 nerve root encroachment. 2. Mild spondylosis at the other lumbar levels without significant spinal stenosis or nerve root encroachment. 3. No acute osseous  findings.  PATIENT SURVEYS:  Modified Oswestry 40% pt left glasses in the car so questionnaire done verbally and was time consuming  10/14/23: Modified Oswestry 2%   COGNITION: Overall cognitive status: Within functional limits for tasks assessed     MUSCLE LENGTH: Moderate decrease in HS and hip flexor lengths bilaterally POSTURE: decreased lumbar lordosis  PALPATION: Decreased fascial mobility in lumbar region bil  LUMBAR ROM:   AROM eval 2/25  Flexion 6 inches from floor WFLs  Extension 10 degrees WFLs  Right lateral flexion 20 degrees WFLs  Left lateral flexion 20 degrees WFLs  Right rotation    Left rotation     (Blank rows = not tested) TRUNK STRENGTH:  Decreased activation of transverse abdominus muscles; abdominals 4-/5; decreased activation of lumbar multifidi; trunk extensors 4-/5  LOWER EXTREMITY ROM:   grossly WFLs except decreased hip external rotation bil left more limited than right  LOWER EXTREMITY MMT:      MMT Right eval Left eval 2/25  Hip flexion 4+ 4+ 5/5  Hip extension 4+ 4+ 5/5  Hip abduction 4 4- 4+/4+  Hip adduction     Hip internal rotation     Hip external rotation     Knee flexion     Knee extension 4+ 4+ 5/5  Ankle dorsiflexion     Ankle plantarflexion 4 4 4+/4+  Ankle inversion     Ankle eversion      (Blank rows = not tested)  LUMBAR SPECIAL TESTS:  Slump test: Negative  FUNCTIONAL TESTS:  Able to rise from the chair without UE assist Able to stoop to pick up small object with ease SLS < 3 sec bilaterally; much less stable on left with pelvic drop 09/12/23: 6 min walk test: 75- age related norm is 1745  GAIT:  Comments: WFLs for short  distances   TREATMENT DATE:  10/22/23:  Nu-Step L3 10 min while discussing status Doorway stretch 5x right/left Seated Matrix row 25# 2x 10 Quadruped rocking crossed ankles 15x Standing 4# yellow plyo ball chops 10x right/left (trunk rotation needed for golf) Manual therapy: soft tissue  mobilization to lumbar and gluteal musculature Trigger Point Dry Needling Subsequent Treatment: Instructions reviewed, if requested by the patient, prior to subsequent dry needling treatment.  Patient Verbal Consent Given: Yes Education Handout Provided: Previously Provided Muscles Treated: bil lumbar multifidi and gluteals  Electrical Stimulation Performed: Yes, Parameters: 1.5 ma 80 pps 8 min to bil lumbar multifidi and gluteals Treatment Response/Outcome: twitch response and improved tissue mobility  Elongation and release to bil low back and gluteals  10/14/23:  Nu-Step L3 10 min while discussing back and ex equipment options  Walking in reverse: 15# x10 Ball roll outs: 3 ways 5" hold x5 each Quadruped rocking crossed ankles 15x Sidelying open books 10x right/left Goblet squats 15# 2x10 Farmer's carry: 10#- 1 lap around the building in each arm Dead lifts 30# bar just below knee level 10x-verbal cues for breathing  10/08/23:  Nu-Step L3 10 min while discussing back and ex equipment options  Pt demo hip hinge no weights (good form) Quadruped rocking crossed ankles 15x Sidelying open books 10x right/left Goblet squats 15# 10x Dead lifts 30# bar just below knee level 10x Modified elbows/knees plank 10x 5 sec hold Therapeutic activity for motions needed with golf, lifting Seated hamstring stretch 2x20 seconds  Manual therapy: soft tissue mobilization to lumbar and gluteal musculature Trigger Point Dry Needling Subsequent Treatment: Instructions reviewed, if requested by the patient, prior to subsequent dry needling treatment.  Patient Verbal Consent Given: Yes Education Handout Provided: Previously Provided Muscles Treated: bil lumbar multifidi and gluteals  Electrical Stimulation Performed: Yes, Parameters: 1.5 ma 80 pps 8 min to bil lumbar multifidi and gluteals Treatment Response/Outcome: twitch response and improved tissue mobility  Elongation and release to bil low back and  gluteals  10/03/23:  Nu-Step L5 10 min while discussing back, gym options  Doorway pec stretch/calf stretch and QL 5x each right/left Therapeutic activity for motions needed with golf, lifting Pink power cord diagonal extensions 10x each way (encouraged hip hinge) standing hamstring stretch on the 2nd step 3x20 seconds  Modified dead lift to knee level pair of 10# dumbbells 12x  Standing snatch/press overhead 5x right/left  Holding pair of 10# weights with step taps 10x 1/2 kneel on BOSU open books, added green band 10x each side Lat bar standing 40# 15x cues to engage lats  PATIENT EDUCATION:  Education details: Access Code: [email or web address], Dry needling; home TENS info Person educated: Patient Education method: Explanation Education comprehension: verbalized understanding  HOME EXERCISE PROGRAM: Access Code: [email or web address] URL: [email or web address] Date: 09/20/2023 Prepared by: Lavinia Sharps  Exercises - Supine Lower Trunk Rotation  - 3 x daily - 7 x weekly - 1 sets - 3 reps - 20 hold - Hooklying Single Knee to Chest  - 3 x daily - 7 x weekly - 1 sets - 3 reps - 20 hold - Seated Hamstring Stretch  - 3 x daily - 7 x weekly - 1 sets - 3 reps - 20 hold - Seated Piriformis Stretch with Trunk Bend  - 3 x daily - 7 x weekly - 1 sets - 3 reps - 20 hold - Sit to Stand with Arms Crossed  - 1 x daily - 7 x weekly - 1 sets - 10 reps -  Standing Hamstring Stretch with Step  - 1 x daily - 7 x weekly - 1 sets - 5 reps - Standing Knee Flexion Stretch on Step  - 1 x daily - 7 x weekly - 1 sets - 5 reps - Half Deadlift with Kettlebell  - 1 x daily - 7 x weekly - 1 sets - 10 reps - Quadruped Rocking Backward  - 1 x daily - 7 x weekly - 1 sets - 10 reps - Sidelying Open Book Thoracic Rotation with Knee on Foam Roll  - 1 x daily - 7 x weekly - 1 sets - 10 reps  ASSESSMENT:  CLINICAL IMPRESSION: Jesusita Oka is progressing with trunk mobility needed for return to golf.  He reports some concern about  re-injuring himself with golf and therefore discussed benefits of strengthening to reduce this risk.  He responds well to DN combined with ES which reduces his thoracolumbar stiffness. Will finalize HEP over the next 4 visits and anticipate readiness for discharge at that time.   OBJECTIVE IMPAIRMENTS: decreased activity tolerance, difficulty walking, decreased strength, increased fascial restrictions, impaired perceived functional ability, impaired flexibility, postural dysfunction, and pain.   ACTIVITY LIMITATIONS: carrying, lifting, sleeping, and locomotion level  PARTICIPATION LIMITATIONS: driving, community activity, and occupation  PERSONAL FACTORS: Past/current experiences, Time since onset of injury/illness/exacerbation, and 1-2 comorbidities: HTN; vascular issues   are also affecting patient's functional outcome.   REHAB POTENTIAL: Good  CLINICAL DECISION MAKING: Stable/uncomplicated  EVALUATION COMPLEXITY: Low   GOALS: Goals reviewed with patient? Yes  SHORT TERM GOALS: Target date: 10/09/2023    The patient will demonstrate knowledge of basic self care strategies and exercises to promote healing  Baseline: using stretching and activity modification to address symptoms (09/24/23) Goal status: MET  2.  The patient will report a 30% improvement in pain levels with functional activities which are currently difficult including sleeping and walking Baseline: 40% (09/24/23) Goal status: MET  3.  The patient will have improved trunk flexor and extensor muscle strength to at least 4/5 needed for lifting medium weight objects such as grocery bags, laundry and luggage  Baseline: 09/24/23 (09/24/23) Goal status: MET  4.  The patient will have improved left hip strength to at least 4+/5 needed for standing, walking longer distances at home and in the community  Baseline:  Goal status: INITIAL    LONG TERM GOALS: Target date: 11/06/2023    The patient will be independent in a  safe self progression of a home and/or gym exercise program to promote further recovery of function   Baseline:  Goal status: INITIAL  2.  The patient will report a 60% improvement in pain levels with functional activities which are currently difficult including sleeping and walking Baseline: 80-90% (10/14/23) Goal status: MET  3.  The patient will have improved trunk flexor and extensor muscle strength to at least 4+/5 needed for lifting his 7 month old granddaughter Baseline:  Goal status: INITIAL  4.  The patient will be able to walk 900 feet in 6 minutes with pain level <5/10 Baseline: 1007 with 5/10 bil leg pain Goal status: MET  5.  Modified Oswestry score improved to 31% indicating improved function with less pain Baseline: 2% (10/14/23) Goal status: MET   PLAN:  PT FREQUENCY: 2x/week  PT DURATION: 8 weeks  PLANNED INTERVENTIONS: 97164- PT Re-evaluation, 97110-Therapeutic exercises, 97530- Therapeutic activity, 97112- Neuromuscular re-education, 97535- Self Care, 16109- Manual therapy, [id number]- Aquatic Therapy, 97014- Electrical stimulation (unattended), [id number]- Electrical stimulation (manual), [id number]-  Ultrasound, 09811- Traction (mechanical), [id number]- Ionotophoresis 4mg /ml Dexamethasone, Patient/Family education, Taping, Dry Needling, Joint mobilization, Spinal manipulation, Spinal mobilization, Cryotherapy, and Moist heat.  PLAN FOR NEXT SESSION:   DN/ES lumbar as needed, Nustep; core strengthening; hip hinge; squats, trunk mobility ex's for golf  Lavinia Sharps, PT 10/22/23 9:26 AM Phone: [phone] Fax: [phone]

## 2023-10-24 ENCOUNTER — Ambulatory Visit: Payer: 59

## 2023-10-24 DIAGNOSIS — M5432 Sciatica, left side: Secondary | ICD-10-CM | POA: Diagnosis not present

## 2023-10-24 DIAGNOSIS — M6281 Muscle weakness (generalized): Secondary | ICD-10-CM

## 2023-10-24 NOTE — Therapy (Signed)
 OUTPATIENT PHYSICAL THERAPY  TREATMENT   Patient Name: Martin Archer MRN: [id number] DOB:[birth year], 63 y.o., male Today's Date: 10/24/2023  Progress Note Reporting Period 08/31/23 to 10/22/23  See note below for Objective Data and Assessment of Progress/Goals.     END OF SESSION:  PT End of Session - 10/24/23 0931     Visit Number 11    Date for PT Re-Evaluation 11/06/23    Authorization Type [employer] medicare/Medicaid secondary    Progress Note Due on Visit 20    PT Start Time 0847    PT Stop Time 0932    PT Time Calculation (min) 45 min    Activity Tolerance Patient tolerated treatment well    Behavior During Therapy [facility] for tasks assessed/performed                 Past Medical History:  Diagnosis Date   Anemia, unspecified    Arthritis    Diverticulosis of colon (without mention of hemorrhage)    Gastric ulcer with hemorrhage and obstruction 2011   Dr Julien Girt [facility]   GERD (gastroesophageal reflux disease)    hx   Hyperlipemia    Hypertension    Personal history of colonic polyps 01/30/2010   hyperplastic rectal   Sleep apnea    on CPAP; [facility]   Stroke [facility])    mini strokes-bilateral carotid endarterectomy   Substance abuse (HCC)    History of alcohol abuse   Past Surgical History:  Procedure Laterality Date   AORTIC ARCH ANGIOGRAPHY N/A 04/26/2021   Procedure: AORTIC ARCH ANGIOGRAPHY;  Surgeon: Iran Ouch, MD;  Location: [facility] INVASIVE CV LAB;  Service: Cardiovascular;  Laterality: N/A;   CARDIAC CATHETERIZATION  01/25/2021   CAROTID ENDARTERECTOMY  11/2006   left Dr Jerilee Field   COLONOSCOPY  2011   DP-positive FOBT/IDA   ENDARTERECTOMY Right 01/19/2015   Procedure: RIGHT CAROTID ENDARTERECTOMY WITH PATCH ANGIOPLASTY;  Surgeon: Pryor Ochoa, MD;  Location: [facility] OR;  Service: Vascular;  Laterality: Right;   ENDARTERECTOMY Right 06/27/2021   Procedure: RIGHT REDO CAROTID ARTERY ENDARTERECTOMY & PLACEMENT OF 15Fr DRAIN;   Surgeon: Chuck Hint, MD;  Location: [facility] OR;  Service: Vascular;  Laterality: Right;   ESOPHAGOGASTRODUODENOSCOPY Left 06/17/2013   Procedure: ESOPHAGOGASTRODUODENOSCOPY (EGD);  Surgeon: Willis Modena, MD;  Location: [facility] ENDOSCOPY;  Service: Endoscopy;  Laterality: Left;   ESOPHAGOGASTRODUODENOSCOPY N/A 09/20/2015   Procedure: ESOPHAGOGASTRODUODENOSCOPY (EGD);  Surgeon: Meryl Dare, MD;  Location: [facility] ENDOSCOPY;  Service: Endoscopy;  Laterality: N/A;   FOOT SURGERY Bilateral 2020   Taylor's foot   INGUINAL HERNIA REPAIR  1963   NASAL FRACTURE SURGERY  1974   PATCH ANGIOPLASTY Right 06/27/2021   Procedure: PATCH ANGIOPLASTY USING Livia Snellen BIOLOGIC PATCH;  Surgeon: Chuck Hint, MD;  Location: [facility] OR;  Service: Vascular;  Laterality: Right;   PERIPHERAL VASCULAR INTERVENTION  04/26/2021   Procedure: PERIPHERAL VASCULAR INTERVENTION;  Surgeon: Iran Ouch, MD;  Location: [facility] INVASIVE CV LAB;  Service: Cardiovascular;;   RIGHT/LEFT HEART CATH AND CORONARY ANGIOGRAPHY N/A 01/25/2021   Procedure: RIGHT/LEFT HEART CATH AND CORONARY ANGIOGRAPHY;  Surgeon: Corky Crafts, MD;  Location: [facility] INVASIVE CV LAB;  Service: Cardiovascular;  Laterality: N/A;   TEE WITHOUT CARDIOVERSION N/A 01/17/2015   Procedure: TRANSESOPHAGEAL ECHOCARDIOGRAM (TEE);  Surgeon: Thurmon Fair, MD;  Location: [facility] ENDOSCOPY;  Service: Cardiovascular;  Laterality: N/A;   WISDOM TOOTH EXTRACTION     Patient Active Problem List   Diagnosis Date Noted  BMI 33.0-33.9,adult 03/08/2023   Erectile dysfunction 10/03/2022   OSA on CPAP 04/10/2022   Skin lesion of left arm 03/29/2022   Diabetes (HCC) 09/24/2021   Vitamin D deficiency 09/24/2021   Diarrhea 09/05/2021   Viral URI 09/05/2021   Sinusitis 09/05/2021   Carotid artery stenosis 06/27/2021   Intermittent pain and swelling of hand 05/29/2021   Stenosis of left subclavian artery (HCC) 04/26/2021   Chronic systolic heart failure (HCC)     Fatigue 12/19/2020   Dyspnea 12/19/2020   Spondylolisthesis of lumbosacral region 05/03/2020   CKD (chronic kidney disease) stage 4, GFR 15-29 ml/min (HCC) 09/26/2017   Syncope 09/26/2017   Elevated transaminase level 07/25/2017   Cannabis abuse 07/23/2017   Uncomplicated alcohol dependence (HCC) 07/23/2017   Melena    Gastric ulcer with hemorrhage    Right hemiparesis (HCC) 09/18/2015   Chronic blood loss anemia 09/18/2015   Hematemesis 09/18/2015   Encounter for well adult exam with abnormal findings 06/29/2015   History of CEA (carotid endarterectomy) 03/25/2015   HLD (hyperlipidemia) 03/25/2015   Tobacco use disorder 03/25/2015   Carotid artery occlusion with infarction (HCC) 03/25/2015   Carotid aneurysm, 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   Stroke (HCC)    Aortic arch atherosclerosis (HCC)    Carotid stenosis 01/16/2015   Thrombus: BILATERAL EXTERNAL JUGULAR VEINS per CT angio head and neck 01/16/15 01/16/2015   DVT (deep venous thrombosis) (HCC)    Numbness and tingling of left arm and leg 01/15/2015   Cerebral infarction (HCC)    Paresthesia 12/08/2014   Alcohol abuse, in remission 07/14/2013   ADD (attention deficit disorder) 07/14/2013   Nicotine dependence 06/18/2013   Back pain, chronic 06/17/2013   ACUTE GASTRIC ULCER W/HEMORRHAGE AND OBSTRUCTION 02/28/2010   History of colonic polyps 02/28/2010   DIVERTICULOSIS, COLON 02/02/2010   ANEMIA, SEVERE 01/13/2010   Essential hypertension 01/13/2010   Sleep apnea 01/13/2010   Left carotid bruit 01/13/2010   MIXED HEARING LOSS BILATERAL 02/16/2009    PCP: Oliver Barre MD  REFERRING PROVIDER: Clementeen Graham MD  REFERRING DIAG: [phone], G89.29 chronic bilateral low back pain with left sided sciatica; M43.00 pars defect  Rationale for Evaluation and Treatment: Rehabilitation  THERAPY DIAG:  Back pain; weakness  ONSET DATE: > 1 year  SUBJECTIVE:                                                                                                                                                                                            SUBJECTIVE STATEMENT: My back is doing OK.  I haven't tested my back, only played 1 round (of golf). I might do some  chipping and putting today.  I should probably stretch every day.  Had a heart scan yesterday and the results came back good.  Has another heart test on Monday.    Lost 20# taking injections I play golf but I haven't swung a club in the last few months 2-3 blocks max walk   Has a gym where he lives but does not use  PERTINENT HISTORY:  L5- S1 ESI 12/11; prior ESI "several" See cardiac history above HTN Vascular issues (may need stint in leg) Numbness in both feet On blood thinners Foot surgery 62 month old Grandtr in [place]  PAIN:  Are you having pain? no NPRS scale: 0/10, not consistent, just some tightness Pain location: bil low back pain and left LE especially knee to ankle; numbness in both feet Pain orientation: Bilateral  PAIN TYPE: aching Pain description: constant  Aggravating factors: my mattress (waken with low back pain); sleeping, walking Relieving factors: fetal position usually left side; sitting (sometimes relief, sometimes not)  PRECAUTIONS: None    WEIGHT BEARING RESTRICTIONS: No  FALLS:  Has patient fallen in last 6 months? No  OCCUPATION: part-time work 4-6 hours standing   PLOF: Independent with basic ADLs  PATIENT GOALS: I hope to loosen things up; maybe play golf; how can I alleviate the pain?;  go to the gym where I live  OBJECTIVE:  Note: Objective measures were completed at Evaluation unless otherwise noted.  DIAGNOSTIC FINDINGS:  MRI Nov '24 IMPRESSION: 1. 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 There is chronic severe foraminal narrowing bilaterally and probable chronic bilateral L5 nerve root encroachment. 2. Mild spondylosis at the other lumbar levels  without significant spinal stenosis or nerve root encroachment. 3. No acute osseous findings.  PATIENT SURVEYS:  Modified Oswestry 40% pt left glasses in the car so questionnaire done verbally and was time consuming  10/14/23: Modified Oswestry 2%   COGNITION: Overall cognitive status: Within functional limits for tasks assessed     MUSCLE LENGTH: Moderate decrease in HS and hip flexor lengths bilaterally POSTURE: decreased lumbar lordosis  PALPATION: Decreased fascial mobility in lumbar region bil  LUMBAR ROM:   AROM eval 2/25  Flexion 6 inches from floor WFLs  Extension 10 degrees WFLs  Right lateral flexion 20 degrees WFLs  Left lateral flexion 20 degrees WFLs  Right rotation    Left rotation     (Blank rows = not tested) TRUNK STRENGTH:  Decreased activation of transverse abdominus muscles; abdominals 4-/5; decreased activation of lumbar multifidi; trunk extensors 4-/5  LOWER EXTREMITY ROM:   grossly WFLs except decreased hip external rotation bil left more limited than right  LOWER EXTREMITY MMT:      MMT Right eval Left eval 2/25  Hip flexion 4+ 4+ 5/5  Hip extension 4+ 4+ 5/5  Hip abduction 4 4- 4+/4+  Hip adduction     Hip internal rotation     Hip external rotation     Knee flexion     Knee extension 4+ 4+ 5/5  Ankle dorsiflexion     Ankle plantarflexion 4 4 4+/4+  Ankle inversion     Ankle eversion      (Blank rows = not tested)  LUMBAR SPECIAL TESTS:  Slump test: Negative  FUNCTIONAL TESTS:  Able to rise from the chair without UE assist Able to stoop to pick up small object with ease SLS < 3 sec bilaterally; much less stable on left with pelvic  drop 09/12/23: 6 min walk test: 22- age related norm is 1745  GAIT:  Comments: WFLs for short distances   TREATMENT DATE:   10/24/23:  Nu-Step L4 10 min while discussing back and ex equipment options  Walking in reverse: 15# x10 Seated hamstring and figure 4 2x20  Standing kettlebell 5# rotation  plyochops x15 each  Sidelying open books 10x right/left Seated Matrix row 25# 2x 10 Chops with pulley: 10# down x15 Rt and Lt Farmer's carry: 10#- 1 lap around the building in each arm  10/22/23:  Nu-Step L3 10 min while discussing status Doorway stretch 5x right/left Seated Matrix row 25# 2x 10 Quadruped rocking crossed ankles 15x Standing 4# yellow plyo ball chops 10x right/left (trunk rotation needed for golf) Manual therapy: soft tissue mobilization to lumbar and gluteal musculature Trigger Point Dry Needling Subsequent Treatment: Instructions reviewed, if requested by the patient, prior to subsequent dry needling treatment.  Patient Verbal Consent Given: Yes Education Handout Provided: Previously Provided Muscles Treated: bil lumbar multifidi and gluteals  Electrical Stimulation Performed: Yes, Parameters: 1.5 ma 80 pps 8 min to bil lumbar multifidi and gluteals Treatment Response/Outcome: twitch response and improved tissue mobility  Elongation and release to bil low back and gluteals  10/14/23:  Nu-Step L3 10 min while discussing back and ex equipment options  Walking in reverse: 15# x10 Ball roll outs: 3 ways 5" hold x5 each Quadruped rocking crossed ankles 15x Sidelying open books 10x right/left Goblet squats 15# 2x10 Farmer's carry: 10#- 1 lap around the building in each arm Dead lifts 30# bar just below knee level 10x-verbal cues for breathing  PATIENT EDUCATION:  Education details: Access Code: [email or web address], Dry needling; home TENS info Person educated: Patient Education method: Explanation Education comprehension: verbalized understanding  HOME EXERCISE PROGRAM: Access Code: [email or web address] URL: [email or web address] Date: 09/20/2023 Prepared by: Lavinia Sharps  Exercises - Supine Lower Trunk Rotation  - 3 x daily - 7 x weekly - 1 sets - 3 reps - 20 hold - Hooklying Single Knee to Chest  - 3 x daily - 7 x weekly - 1 sets - 3 reps - 20 hold - Seated  Hamstring Stretch  - 3 x daily - 7 x weekly - 1 sets - 3 reps - 20 hold - Seated Piriformis Stretch with Trunk Bend  - 3 x daily - 7 x weekly - 1 sets - 3 reps - 20 hold - Sit to Stand with Arms Crossed  - 1 x daily - 7 x weekly - 1 sets - 10 reps - Standing Hamstring Stretch with Step  - 1 x daily - 7 x weekly - 1 sets - 5 reps - Standing Knee Flexion Stretch on Step  - 1 x daily - 7 x weekly - 1 sets - 5 reps - Half Deadlift with Kettlebell  - 1 x daily - 7 x weekly - 1 sets - 10 reps - Quadruped Rocking Backward  - 1 x daily - 7 x weekly - 1 sets - 10 reps - Sidelying Open Book Thoracic Rotation with Knee on Foam Roll  - 1 x daily - 7 x weekly - 1 sets - 10 reps  ASSESSMENT:  CLINICAL IMPRESSION: Pt continues to work on strength and flexibility needed for function and return to golf. Pt tolerates advancement of exercise well and continues to work on this at home.  PT discussed strategies for pain management if he were to experience any.  Discussed decompression and stretching exercises to manage  this.  PT monitored throughout session for pain, exercise tolerance and technique.  Probable D/C over the next 2-3 sessions.  Patient will benefit from skilled PT to address the below impairments and improve overall function.    OBJECTIVE IMPAIRMENTS: decreased activity tolerance, difficulty walking, decreased strength, increased fascial restrictions, impaired perceived functional ability, impaired flexibility, postural dysfunction, and pain.   ACTIVITY LIMITATIONS: carrying, lifting, sleeping, and locomotion level  PARTICIPATION LIMITATIONS: driving, community activity, and occupation  PERSONAL FACTORS: Past/current experiences, Time since onset of injury/illness/exacerbation, and 1-2 comorbidities: HTN; vascular issues   are also affecting patient's functional outcome.   REHAB POTENTIAL: Good  CLINICAL DECISION MAKING: Stable/uncomplicated  EVALUATION COMPLEXITY: Low   GOALS: Goals  reviewed with patient? Yes  SHORT TERM GOALS: Target date: 10/09/2023    The patient will demonstrate knowledge of basic self care strategies and exercises to promote healing  Baseline: using stretching and activity modification to address symptoms (09/24/23) Goal status: MET  2.  The patient will report a 30% improvement in pain levels with functional activities which are currently difficult including sleeping and walking Baseline: 40% (09/24/23) Goal status: MET  3.  The patient will have improved trunk flexor and extensor muscle strength to at least 4/5 needed for lifting medium weight objects such as grocery bags, laundry and luggage  Baseline: 09/24/23 (09/24/23) Goal status: MET  4.  The patient will have improved left hip strength to at least 4+/5 needed for standing, walking longer distances at home and in the community  Baseline:  Goal status: INITIAL    LONG TERM GOALS: Target date: 11/06/2023    The patient will be independent in a safe self progression of a home and/or gym exercise program to promote further recovery of function   Baseline:  Goal status: INITIAL  2.  The patient will report a 60% improvement in pain levels with functional activities which are currently difficult including sleeping and walking Baseline: 80-90% (10/14/23) Goal status: MET  3.  The patient will have improved trunk flexor and extensor muscle strength to at least 4+/5 needed for lifting his 57 month old granddaughter Baseline:  Goal status: INITIAL  4.  The patient will be able to walk 900 feet in 6 minutes with pain level <5/10 Baseline: 1007 with 5/10 bil leg pain Goal status: MET  5.  Modified Oswestry score improved to 31% indicating improved function with less pain Baseline: 2% (10/14/23) Goal status: MET   PLAN:  PT FREQUENCY: 2x/week  PT DURATION: 8 weeks  PLANNED INTERVENTIONS: 97164- PT Re-evaluation, 97110-Therapeutic exercises, 97530- Therapeutic activity, 97112-  Neuromuscular re-education, 97535- Self Care, 40981- Manual therapy, [id number]- Aquatic Therapy, 97014- Electrical stimulation (unattended), [id number]- Electrical stimulation (manual), [id number]- Ultrasound, 19147- Traction (mechanical), [id number]- Ionotophoresis 4mg /ml Dexamethasone, Patient/Family education, Taping, Dry Needling, Joint mobilization, Spinal manipulation, Spinal mobilization, Cryotherapy, and Moist heat.  PLAN FOR NEXT SESSION:   DN/ES lumbar as needed, Nustep; core strengthening; hip hinge; squats, trunk mobility ex's for golf  Lorrene Reid, PT 10/24/23 9:33 AM

## 2023-10-28 ENCOUNTER — Ambulatory Visit (HOSPITAL_COMMUNITY)
Admission: RE | Admit: 2023-10-28 | Discharge: 2023-10-28 | Disposition: A | Discharge status: Home / Self Care | Payer: 59 | Source: Ambulatory Visit | Attending: Cardiovascular Disease | Admitting: Cardiovascular Disease

## 2023-10-28 DIAGNOSIS — I700 Atherosclerosis of aorta: Secondary | ICD-10-CM | POA: Diagnosis present

## 2023-10-28 DIAGNOSIS — I714 Abdominal aortic aneurysm, without rupture, unspecified: Secondary | ICD-10-CM | POA: Insufficient documentation

## 2023-10-29 ENCOUNTER — Ambulatory Visit: Payer: 59 | Attending: Family Medicine | Admitting: Physical Therapy

## 2023-10-29 DIAGNOSIS — M5432 Sciatica, left side: Secondary | ICD-10-CM | POA: Diagnosis present

## 2023-10-29 DIAGNOSIS — M6281 Muscle weakness (generalized): Secondary | ICD-10-CM | POA: Diagnosis present

## 2023-10-29 NOTE — Therapy (Signed)
 OUTPATIENT PHYSICAL THERAPY  TREATMENT   Patient Name: Martin Archer MRN: [id number] DOB:Jan 24, 1961, 63 y.o., male Today's Date: 10/29/2023   END OF SESSION:  PT End of Session - 10/29/23 0851     Visit Number 12    Date for PT Re-Evaluation 11/06/23    Authorization Type UHC medicare/Medicaid secondary    Progress Note Due on Visit 20    PT Start Time 0846    PT Stop Time 0925    PT Time Calculation (min) 39 min    Activity Tolerance Patient tolerated treatment well                 Past Medical History:  Diagnosis Date   Anemia, unspecified    Arthritis    Diverticulosis of colon (without mention of hemorrhage)    Gastric ulcer with hemorrhage and obstruction 2011   Dr Julien Girt [facility]   GERD (gastroesophageal reflux disease)    hx   Hyperlipemia    Hypertension    Personal history of colonic polyps 01/30/2010   hyperplastic rectal   Sleep apnea    on CPAP; [facility]   Stroke [facility])    mini strokes-bilateral carotid endarterectomy   Substance abuse (HCC)    History of alcohol abuse   Past Surgical History:  Procedure Laterality Date   AORTIC ARCH ANGIOGRAPHY N/A 04/26/2021   Procedure: AORTIC ARCH ANGIOGRAPHY;  Surgeon: Iran Ouch, MD;  Location: [facility] INVASIVE CV LAB;  Service: Cardiovascular;  Laterality: N/A;   CARDIAC CATHETERIZATION  01/25/2021   CAROTID ENDARTERECTOMY  11/2006   left Dr Jerilee Field   COLONOSCOPY  2011   DP-positive FOBT/IDA   ENDARTERECTOMY Right 01/19/2015   Procedure: RIGHT CAROTID ENDARTERECTOMY WITH PATCH ANGIOPLASTY;  Surgeon: Pryor Ochoa, MD;  Location: [facility] OR;  Service: Vascular;  Laterality: Right;   ENDARTERECTOMY Right 06/27/2021   Procedure: RIGHT REDO CAROTID ARTERY ENDARTERECTOMY & PLACEMENT OF 15Fr DRAIN;  Surgeon: Chuck Hint, MD;  Location: [facility] OR;  Service: Vascular;  Laterality: Right;   ESOPHAGOGASTRODUODENOSCOPY Left 06/17/2013   Procedure: ESOPHAGOGASTRODUODENOSCOPY (EGD);   Surgeon: Willis Modena, MD;  Location: [facility] ENDOSCOPY;  Service: Endoscopy;  Laterality: Left;   ESOPHAGOGASTRODUODENOSCOPY N/A 09/20/2015   Procedure: ESOPHAGOGASTRODUODENOSCOPY (EGD);  Surgeon: Meryl Dare, MD;  Location: [facility] ENDOSCOPY;  Service: Endoscopy;  Laterality: N/A;   FOOT SURGERY Bilateral 2020   Taylor's foot   INGUINAL HERNIA REPAIR  1963   NASAL FRACTURE SURGERY  1974   PATCH ANGIOPLASTY Right 06/27/2021   Procedure: PATCH ANGIOPLASTY USING Livia Snellen BIOLOGIC PATCH;  Surgeon: Chuck Hint, MD;  Location: [facility] OR;  Service: Vascular;  Laterality: Right;   PERIPHERAL VASCULAR INTERVENTION  04/26/2021   Procedure: PERIPHERAL VASCULAR INTERVENTION;  Surgeon: Iran Ouch, MD;  Location: [facility] INVASIVE CV LAB;  Service: Cardiovascular;;   RIGHT/LEFT HEART CATH AND CORONARY ANGIOGRAPHY N/A 01/25/2021   Procedure: RIGHT/LEFT HEART CATH AND CORONARY ANGIOGRAPHY;  Surgeon: Corky Crafts, MD;  Location: [facility] INVASIVE CV LAB;  Service: Cardiovascular;  Laterality: N/A;   TEE WITHOUT CARDIOVERSION N/A 01/17/2015   Procedure: TRANSESOPHAGEAL ECHOCARDIOGRAM (TEE);  Surgeon: Thurmon Fair, MD;  Location: [facility] ENDOSCOPY;  Service: Cardiovascular;  Laterality: N/A;   WISDOM TOOTH EXTRACTION     Patient Active Problem List   Diagnosis Date Noted   BMI 33.0-33.9,adult 03/08/2023   Erectile dysfunction 10/03/2022   OSA on CPAP 04/10/2022   Skin lesion of left arm 03/29/2022   Diabetes (HCC) 09/24/2021   Vitamin  D deficiency 09/24/2021   Diarrhea 09/05/2021   Viral URI 09/05/2021   Sinusitis 09/05/2021   Carotid artery stenosis 06/27/2021   Intermittent pain and swelling of hand 05/29/2021   Stenosis of left subclavian artery (HCC) 04/26/2021   Chronic systolic heart failure (HCC)    Fatigue 12/19/2020   Dyspnea 12/19/2020   Spondylolisthesis of lumbosacral region 05/03/2020   CKD (chronic kidney disease) stage 4, GFR 15-29 ml/min (HCC) 09/26/2017   Syncope 09/26/2017    Elevated transaminase level 07/25/2017   Cannabis abuse 07/23/2017   Uncomplicated alcohol dependence (HCC) 07/23/2017   Melena    Gastric ulcer with hemorrhage    Right hemiparesis (HCC) 09/18/2015   Chronic blood loss anemia 09/18/2015   Hematemesis 09/18/2015   Encounter for well adult exam with abnormal findings 06/29/2015   History of CEA (carotid endarterectomy) 03/25/2015   HLD (hyperlipidemia) 03/25/2015   Tobacco use disorder 03/25/2015   Carotid artery occlusion with infarction (HCC) 03/25/2015   Carotid aneurysm, 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   Stroke (HCC)    Aortic arch atherosclerosis (HCC)    Carotid stenosis 01/16/2015   Thrombus: BILATERAL EXTERNAL JUGULAR VEINS per CT angio head and neck 01/16/15 01/16/2015   DVT (deep venous thrombosis) (HCC)    Numbness and tingling of left arm and leg 01/15/2015   Cerebral infarction (HCC)    Paresthesia 12/08/2014   Alcohol abuse, in remission 07/14/2013   ADD (attention deficit disorder) 07/14/2013   Nicotine dependence 06/18/2013   Back pain, chronic 06/17/2013   ACUTE GASTRIC ULCER W/HEMORRHAGE AND OBSTRUCTION 02/28/2010   History of colonic polyps 02/28/2010   DIVERTICULOSIS, COLON 02/02/2010   ANEMIA, SEVERE 01/13/2010   Essential hypertension 01/13/2010   Sleep apnea 01/13/2010   Left carotid bruit 01/13/2010   MIXED HEARING LOSS BILATERAL 02/16/2009    PCP: Oliver Barre MD  REFERRING PROVIDER: Clementeen Graham MD  REFERRING DIAG: [id number], G89.29 chronic bilateral low back pain with left sided sciatica; M43.00 pars defect  Rationale for Evaluation and Treatment: Rehabilitation  THERAPY DIAG:  Back pain; weakness  ONSET DATE: > 1 year  SUBJECTIVE:                                                                                                                                                                                           SUBJECTIVE STATEMENT: Had U/S for AAA yesterday, no results yet. Back is feeling better.  The DN really helps me.   Lost 20# taking injections I play golf but I haven't swung a club in the last few months 2-3 blocks max walk   Has a gym where he lives but does  not use  PERTINENT HISTORY:  L5- S1 ESI 12/11; prior ESI "several" See cardiac history above HTN Vascular issues (may need stint in leg) Numbness in both feet On blood thinners Foot surgery 78 month old Grandtr in [place]  PAIN:  Are you having pain? no NPRS scale: 0/10, not consistent, just some tightness Pain location: bil low back pain and left LE especially knee to ankle; numbness in both feet Pain orientation: Bilateral  PAIN TYPE: aching Pain description: constant  Aggravating factors: my mattress (waken with low back pain); sleeping, walking Relieving factors: fetal position usually left side; sitting (sometimes relief, sometimes not)  PRECAUTIONS: None    WEIGHT BEARING RESTRICTIONS: No  FALLS:  Has patient fallen in last 6 months? No  OCCUPATION: part-time work 4-6 hours standing   PLOF: Independent with basic ADLs  PATIENT GOALS: I hope to loosen things up; maybe play golf; how can I alleviate the pain?;  go to the gym where I live  OBJECTIVE:  Note: Objective measures were completed at Evaluation unless otherwise noted.  DIAGNOSTIC FINDINGS:  MRI Nov '24 IMPRESSION: 1. 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 There is chronic severe foraminal narrowing bilaterally and probable chronic bilateral L5 nerve root encroachment. 2. Mild spondylosis at the other lumbar levels without significant spinal stenosis or nerve root encroachment. 3. No acute osseous findings.  PATIENT SURVEYS:  Modified Oswestry 40% pt left glasses in the car so questionnaire done verbally and was time consuming  10/14/23: Modified Oswestry 2%   COGNITION: Overall cognitive status: Within functional limits for tasks assessed     MUSCLE  LENGTH: Moderate decrease in HS and hip flexor lengths bilaterally POSTURE: decreased lumbar lordosis  PALPATION: Decreased fascial mobility in lumbar region bil  LUMBAR ROM:   AROM eval 2/25  Flexion 6 inches from floor WFLs  Extension 10 degrees WFLs  Right lateral flexion 20 degrees WFLs  Left lateral flexion 20 degrees WFLs  Right rotation    Left rotation     (Blank rows = not tested) TRUNK STRENGTH:  Decreased activation of transverse abdominus muscles; abdominals 4-/5; decreased activation of lumbar multifidi; trunk extensors 4-/5  LOWER EXTREMITY ROM:   grossly WFLs except decreased hip external rotation bil left more limited than right  LOWER EXTREMITY MMT:      MMT Right eval Left eval 2/25  Hip flexion 4+ 4+ 5/5  Hip extension 4+ 4+ 5/5  Hip abduction 4 4- 4+/4+  Hip adduction     Hip internal rotation     Hip external rotation     Knee flexion     Knee extension 4+ 4+ 5/5  Ankle dorsiflexion     Ankle plantarflexion 4 4 4+/4+  Ankle inversion     Ankle eversion      (Blank rows = not tested)  LUMBAR SPECIAL TESTS:  Slump test: Negative  FUNCTIONAL TESTS:  Able to rise from the chair without UE assist Able to stoop to pick up small object with ease SLS < 3 sec bilaterally; much less stable on left with pelvic drop 09/12/23: 6 min walk test: 54- age related norm is 1745  GAIT:  Comments: WFLs for short distances   TREATMENT DATE:  10/29/23:  bike 8 min while discussing current status treatment plan Seated hamstring  2x20  Quadruped rock to childs pose 5x Sidelying open books 10x right/left Seated Matrix row 25# 2x 10 Manual therapy: soft tissue mobilization to lumbar  and gluteal musculature Trigger Point Dry Needling Subsequent Treatment: Instructions reviewed, if requested by the patient, prior to subsequent dry needling treatment.  Patient Verbal Consent Given: Yes Education Handout Provided: Previously Provided Muscles Treated: bil lumbar  multifidi and gluteals  Electrical Stimulation Performed: Yes, Parameters: 1.5 ma 80 pps 8 min to bil lumbar multifidi and gluteals Treatment Response/Outcome: twitch response and improved tissue mobility  Elongation and release to bil low back and gluteals 10/24/23:  Nu-Step L4 10 min while discussing back and ex equipment options  Walking in reverse: 15# x10 Seated hamstring and figure 4 2x20  Standing kettlebell 5# rotation plyochops x15 each  Sidelying open books 10x right/left Seated Matrix row 25# 2x 10 Chops with pulley: 10# down x15 Rt and Lt Farmer's carry: 10#- 1 lap around the building in each arm  10/22/23:  Nu-Step L3 10 min while discussing status Doorway stretch 5x right/left Seated Matrix row 25# 2x 10 Quadruped rocking crossed ankles 15x Standing 4# yellow plyo ball chops 10x right/left (trunk rotation needed for golf) Manual therapy: soft tissue mobilization to lumbar and gluteal musculature Trigger Point Dry Needling Subsequent Treatment: Instructions reviewed, if requested by the patient, prior to subsequent dry needling treatment.  Patient Verbal Consent Given: Yes Education Handout Provided: Previously Provided Muscles Treated: bil lumbar multifidi and gluteals  Electrical Stimulation Performed: Yes, Parameters: 1.5 ma 80 pps 8 min to bil lumbar multifidi and gluteals Treatment Response/Outcome: twitch response and improved tissue mobility  Elongation and release to bil low back and gluteals  10/14/23:  Nu-Step L3 10 min while discussing back and ex equipment options  Walking in reverse: 15# x10 Ball roll outs: 3 ways 5" hold x5 each Quadruped rocking crossed ankles 15x Sidelying open books 10x right/left Goblet squats 15# 2x10 Farmer's carry: 10#- 1 lap around the building in each arm Dead lifts 30# bar just below knee level 10x-verbal cues for breathing  PATIENT EDUCATION:  Education details: Access Code: [email or web address], Dry needling; home TENS info Person  educated: Patient Education method: Explanation Education comprehension: verbalized understanding  HOME EXERCISE PROGRAM: Access Code: [email or web address] URL: [email or web address] Date: 09/20/2023 Prepared by: Lavinia Sharps  Exercises - Supine Lower Trunk Rotation  - 3 x daily - 7 x weekly - 1 sets - 3 reps - 20 hold - Hooklying Single Knee to Chest  - 3 x daily - 7 x weekly - 1 sets - 3 reps - 20 hold - Seated Hamstring Stretch  - 3 x daily - 7 x weekly - 1 sets - 3 reps - 20 hold - Seated Piriformis Stretch with Trunk Bend  - 3 x daily - 7 x weekly - 1 sets - 3 reps - 20 hold - Sit to Stand with Arms Crossed  - 1 x daily - 7 x weekly - 1 sets - 10 reps - Standing Hamstring Stretch with Step  - 1 x daily - 7 x weekly - 1 sets - 5 reps - Standing Knee Flexion Stretch on Step  - 1 x daily - 7 x weekly - 1 sets - 5 reps - Half Deadlift with Kettlebell  - 1 x daily - 7 x weekly - 1 sets - 10 reps - Quadruped Rocking Backward  - 1 x daily - 7 x weekly - 1 sets - 10 reps - Sidelying Open Book Thoracic Rotation with Knee on Foam Roll  - 1 x daily - 7 x weekly - 1 sets - 10 reps  ASSESSMENT:  CLINICAL IMPRESSION:  Therapist providing verbal cues to optimize technique with exercises but fewer cues needed overall.  The patient benefits significantly from dry needling combined with ES to stimulate underlying myofascial trigger points and muscular tissue for management of neuromusculoskeletal pain and address movement impairments.  Much improved soft tissue mobility following treatment session.    Jesusita Oka should meet remaining goals in the next 1-2 visits and be ready for discharge at that time.    OBJECTIVE IMPAIRMENTS: decreased activity tolerance, difficulty walking, decreased strength, increased fascial restrictions, impaired perceived functional ability, impaired flexibility, postural dysfunction, and pain.   ACTIVITY LIMITATIONS: carrying, lifting, sleeping, and locomotion  level  PARTICIPATION LIMITATIONS: driving, community activity, and occupation  PERSONAL FACTORS: Past/current experiences, Time since onset of injury/illness/exacerbation, and 1-2 comorbidities: HTN; vascular issues   are also affecting patient's functional outcome.   REHAB POTENTIAL: Good  CLINICAL DECISION MAKING: Stable/uncomplicated  EVALUATION COMPLEXITY: Low   GOALS: Goals reviewed with patient? Yes  SHORT TERM GOALS: Target date: 10/09/2023    The patient will demonstrate knowledge of basic self care strategies and exercises to promote healing  Baseline: using stretching and activity modification to address symptoms (09/24/23) Goal status: MET  2.  The patient will report a 30% improvement in pain levels with functional activities which are currently difficult including sleeping and walking Baseline: 40% (09/24/23) Goal status: MET  3.  The patient will have improved trunk flexor and extensor muscle strength to at least 4/5 needed for lifting medium weight objects such as grocery bags, laundry and luggage  Baseline: 09/24/23 (09/24/23) Goal status: MET  4.  The patient will have improved left hip strength to at least 4+/5 needed for standing, walking longer distances at home and in the community  Baseline:  Goal status: INITIAL    LONG TERM GOALS: Target date: 11/06/2023    The patient will be independent in a safe self progression of a home and/or gym exercise program to promote further recovery of function   Baseline:  Goal status: INITIAL  2.  The patient will report a 60% improvement in pain levels with functional activities which are currently difficult including sleeping and walking Baseline: 80-90% (10/14/23) Goal status: MET  3.  The patient will have improved trunk flexor and extensor muscle strength to at least 4+/5 needed for lifting his 28 month old granddaughter Baseline:  Goal status: INITIAL  4.  The patient will be able to walk 900 feet in 6  minutes with pain level <5/10 Baseline: 1007 with 5/10 bil leg pain Goal status: MET  5.  Modified Oswestry score improved to 31% indicating improved function with less pain Baseline: 2% (10/14/23) Goal status: MET   PLAN:  PT FREQUENCY: 2x/week  PT DURATION: 8 weeks  PLANNED INTERVENTIONS: 97164- PT Re-evaluation, 97110-Therapeutic exercises, 97530- Therapeutic activity, 97112- Neuromuscular re-education, 97535- Self Care, 16109- Manual therapy, [id number]- Aquatic Therapy, 97014- Electrical stimulation (unattended), [id number]- Electrical stimulation (manual), [id number]- Ultrasound, [id number]- Traction (mechanical), [id number]- Ionotophoresis 4mg /ml Dexamethasone, Patient/Family education, Taping, Dry Needling, Joint mobilization, Spinal manipulation, Spinal mobilization, Cryotherapy, and Moist heat.  PLAN FOR NEXT SESSION:   discharge in 1-2 visits; DN/ES lumbar as needed, Nustep; core strengthening; hip hinge; squats, trunk mobility ex's for golf  Lavinia Sharps, PT 10/29/23 9:21 AM Phone: [phone] Fax: [phone]

## 2023-10-30 ENCOUNTER — Other Ambulatory Visit (HOSPITAL_COMMUNITY): Payer: Self-pay

## 2023-10-30 DIAGNOSIS — I714 Abdominal aortic aneurysm, without rupture, unspecified: Secondary | ICD-10-CM

## 2023-10-31 ENCOUNTER — Ambulatory Visit: Payer: 59 | Admitting: Physical Therapy

## 2023-10-31 ENCOUNTER — Ambulatory Visit

## 2023-10-31 DIAGNOSIS — M5432 Sciatica, left side: Secondary | ICD-10-CM

## 2023-10-31 DIAGNOSIS — M6281 Muscle weakness (generalized): Secondary | ICD-10-CM

## 2023-10-31 NOTE — Therapy (Signed)
 OUTPATIENT PHYSICAL THERAPY  TREATMENT   Patient Name: Martin Archer MRN: [id number] DOB:October 31, 1960, 63 y.o., male Today's Date: 10/31/2023   END OF SESSION:  PT End of Session - 10/31/23 1145     Visit Number 13    Date for PT Re-Evaluation 11/06/23    Authorization Type UHC medicare/Medicaid secondary    Progress Note Due on Visit 20    PT Start Time 1102    PT Stop Time 1146    PT Time Calculation (min) 44 min    Activity Tolerance Patient tolerated treatment well    Behavior During Therapy WFL for tasks assessed/performed                  Past Medical History:  Diagnosis Date   Anemia, unspecified    Arthritis    Diverticulosis of colon (without mention of hemorrhage)    Gastric ulcer with hemorrhage and obstruction 2011   Dr Julien Girt [facility]   GERD (gastroesophageal reflux disease)    hx   Hyperlipemia    Hypertension    Personal history of colonic polyps 01/30/2010   hyperplastic rectal   Sleep apnea    on CPAP; [facility]   Stroke [facility])    mini strokes-bilateral carotid endarterectomy   Substance abuse (HCC)    History of alcohol abuse   Past Surgical History:  Procedure Laterality Date   AORTIC ARCH ANGIOGRAPHY N/A 04/26/2021   Procedure: AORTIC ARCH ANGIOGRAPHY;  Surgeon: Iran Ouch, MD;  Location: [facility] INVASIVE CV LAB;  Service: Cardiovascular;  Laterality: N/A;   CARDIAC CATHETERIZATION  01/25/2021   CAROTID ENDARTERECTOMY  11/2006   left Dr Jerilee Field   COLONOSCOPY  2011   DP-positive FOBT/IDA   ENDARTERECTOMY Right 01/19/2015   Procedure: RIGHT CAROTID ENDARTERECTOMY WITH PATCH ANGIOPLASTY;  Surgeon: Pryor Ochoa, MD;  Location: [facility] OR;  Service: Vascular;  Laterality: Right;   ENDARTERECTOMY Right 06/27/2021   Procedure: RIGHT REDO CAROTID ARTERY ENDARTERECTOMY & PLACEMENT OF 15Fr DRAIN;  Surgeon: Chuck Hint, MD;  Location: [facility] OR;  Service: Vascular;  Laterality: Right;   ESOPHAGOGASTRODUODENOSCOPY  Left 06/17/2013   Procedure: ESOPHAGOGASTRODUODENOSCOPY (EGD);  Surgeon: Willis Modena, MD;  Location: [facility] ENDOSCOPY;  Service: Endoscopy;  Laterality: Left;   ESOPHAGOGASTRODUODENOSCOPY N/A 09/20/2015   Procedure: ESOPHAGOGASTRODUODENOSCOPY (EGD);  Surgeon: Meryl Dare, MD;  Location: [facility] ENDOSCOPY;  Service: Endoscopy;  Laterality: N/A;   FOOT SURGERY Bilateral 2020   Taylor's foot   INGUINAL HERNIA REPAIR  1963   NASAL FRACTURE SURGERY  1974   PATCH ANGIOPLASTY Right 06/27/2021   Procedure: PATCH ANGIOPLASTY USING Livia Snellen BIOLOGIC PATCH;  Surgeon: Chuck Hint, MD;  Location: [facility] OR;  Service: Vascular;  Laterality: Right;   PERIPHERAL VASCULAR INTERVENTION  04/26/2021   Procedure: PERIPHERAL VASCULAR INTERVENTION;  Surgeon: Iran Ouch, MD;  Location: [facility] INVASIVE CV LAB;  Service: Cardiovascular;;   RIGHT/LEFT HEART CATH AND CORONARY ANGIOGRAPHY N/A 01/25/2021   Procedure: RIGHT/LEFT HEART CATH AND CORONARY ANGIOGRAPHY;  Surgeon: Corky Crafts, MD;  Location[facility] INVASIVE CV LAB;  Service: Cardiovascular;  Laterality: N/A;   TEE WITHOUT CARDIOVERSION N/A 01/17/2015   Procedure: TRANSESOPHAGEAL ECHOCARDIOGRAM (TEE);  Surgeon: Thurmon Fair, MD;  Location: [facility] ENDOSCOPY;  Service: Cardiovascular;  Laterality: N/A;   WISDOM TOOTH EXTRACTION     Patient Active Problem List   Diagnosis Date Noted   BMI 33.0-33.9,adult 03/08/2023   Erectile dysfunction 10/03/2022   OSA on CPAP 04/10/2022   Skin lesion of  left arm 03/29/2022   Diabetes (HCC) 09/24/2021   Vitamin D deficiency 09/24/2021   Diarrhea 09/05/2021   Viral URI 09/05/2021   Sinusitis 09/05/2021   Carotid artery stenosis 06/27/2021   Intermittent pain and swelling of hand 05/29/2021   Stenosis of left subclavian artery (HCC) 04/26/2021   Chronic systolic heart failure (HCC)    Fatigue 12/19/2020   Dyspnea 12/19/2020   Spondylolisthesis of lumbosacral region 05/03/2020   CKD (chronic kidney disease)  stage 4, GFR 15-29 ml/min (HCC) 09/26/2017   Syncope 09/26/2017   Elevated transaminase level 07/25/2017   Cannabis abuse 07/23/2017   Uncomplicated alcohol dependence (HCC) 07/23/2017   Melena    Gastric ulcer with hemorrhage    Right hemiparesis (HCC) 09/18/2015   Chronic blood loss anemia 09/18/2015   Hematemesis 09/18/2015   Encounter for well adult exam with abnormal findings 06/29/2015   History of CEA (carotid endarterectomy) 03/25/2015   HLD (hyperlipidemia) 03/25/2015   Tobacco use disorder 03/25/2015   Carotid artery occlusion with infarction (HCC) 03/25/2015   Carotid aneurysm, 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   Stroke (HCC)    Aortic arch atherosclerosis (HCC)    Carotid stenosis 01/16/2015   Thrombus: BILATERAL EXTERNAL JUGULAR VEINS per CT angio head and neck 01/16/15 01/16/2015   DVT (deep venous thrombosis) (HCC)    Numbness and tingling of left arm and leg 01/15/2015   Cerebral infarction (HCC)    Paresthesia 12/08/2014   Alcohol abuse, in remission 07/14/2013   ADD (attention deficit disorder) 07/14/2013   Nicotine dependence 06/18/2013   Back pain, chronic 06/17/2013   ACUTE GASTRIC ULCER W/HEMORRHAGE AND OBSTRUCTION 02/28/2010   History of colonic polyps 02/28/2010   DIVERTICULOSIS, COLON 02/02/2010   ANEMIA, SEVERE 01/13/2010   Essential hypertension 01/13/2010   Sleep apnea 01/13/2010   Left carotid bruit 01/13/2010   MIXED HEARING LOSS BILATERAL 02/16/2009    PCP: Oliver Barre MD  REFERRING PROVIDER: Clementeen Graham MD  REFERRING DIAG: [phone], G89.29 chronic bilateral low back pain with left sided sciatica; M43.00 pars defect  Rationale for Evaluation and Treatment: Rehabilitation  THERAPY DIAG:  Back pain; weakness  ONSET DATE: > 1 year  SUBJECTIVE:                                                                                                                                                                                           SUBJECTIVE STATEMENT: I  felt good after last session. I feel 80% better since the start of care.    Lost 20# taking injections I play golf but I haven't swung a club in the last few months 2-3 blocks max walk  Has a gym where he lives but does not use  PERTINENT HISTORY:  L5- S1 ESI 12/11; prior ESI "several" See cardiac history above HTN Vascular issues (may need stint in leg) Numbness in both feet On blood thinners Foot surgery 33 month old Grandtr in [place]  PAIN:  Are you having pain? no NPRS scale: 0/10, not consistent, just some tightness Pain location: bil low back pain and left LE especially knee to ankle; numbness in both feet Pain orientation: Bilateral  PAIN TYPE: aching Pain description: constant  Aggravating factors: my mattress (waken with low back pain); sleeping, walking Relieving factors: fetal position usually left side; sitting (sometimes relief, sometimes not)  PRECAUTIONS: None    WEIGHT BEARING RESTRICTIONS: No  FALLS:  Has patient fallen in last 6 months? No  OCCUPATION: part-time work 4-6 hours standing   PLOF: Independent with basic ADLs  PATIENT GOALS: I hope to loosen things up; maybe play golf; how can I alleviate the pain?;  go to the gym where I live  OBJECTIVE:  Note: Objective measures were completed at Evaluation unless otherwise noted.  DIAGNOSTIC FINDINGS:  MRI Nov '24 IMPRESSION: 1. 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 There is chronic severe foraminal narrowing bilaterally and probable chronic bilateral L5 nerve root encroachment. 2. Mild spondylosis at the other lumbar levels without significant spinal stenosis or nerve root encroachment. 3. No acute osseous findings.  PATIENT SURVEYS:  Modified Oswestry 40% pt left glasses in the car so questionnaire done verbally and was time consuming  10/14/23: Modified Oswestry 2%   COGNITION: Overall cognitive status: Within functional  limits for tasks assessed     MUSCLE LENGTH: Moderate decrease in HS and hip flexor lengths bilaterally POSTURE: decreased lumbar lordosis  PALPATION: Decreased fascial mobility in lumbar region bil  LUMBAR ROM:   AROM eval 2/25  Flexion 6 inches from floor WFLs  Extension 10 degrees WFLs  Right lateral flexion 20 degrees WFLs  Left lateral flexion 20 degrees WFLs  Right rotation    Left rotation     (Blank rows = not tested) TRUNK STRENGTH:  Decreased activation of transverse abdominus muscles; abdominals 4-/5; decreased activation of lumbar multifidi; trunk extensors 4-/5  LOWER EXTREMITY ROM:   grossly WFLs except decreased hip external rotation bil left more limited than right  LOWER EXTREMITY MMT:      MMT Right eval Left eval 2/25  Hip flexion 4+ 4+ 5/5  Hip extension 4+ 4+ 5/5  Hip abduction 4 4- 4+/4+  Hip adduction     Hip internal rotation     Hip external rotation     Knee flexion     Knee extension 4+ 4+ 5/5  Ankle dorsiflexion     Ankle plantarflexion 4 4 4+/4+  Ankle inversion     Ankle eversion      (Blank rows = not tested)  LUMBAR SPECIAL TESTS:  Slump test: Negative  FUNCTIONAL TESTS:  Able to rise from the chair without UE assist Able to stoop to pick up small object with ease SLS < 3 sec bilaterally; much less stable on left with pelvic drop 09/12/23: 6 min walk test: 28- age related norm is 1745  GAIT:  Comments: WFLs for short distances   TREATMENT DATE:   10/31/23:  Nu-Step L4 10 min while discussing back and ex equipment options  Walking in reverse: 15# x10 Seated hamstring and figure 4 2x20  Standing kettlebell 5# rotation plyochops  x15 each  Sidelying open books 10x right/left Seated Matrix row 25# 2x 10 Chops with pulley: 10# down x15 Rt and Lt Farmer's carry: 10#- 1 lap around the building in each arm  10/29/23:  bike 8 min while discussing current status treatment plan Seated hamstring  2x20  Quadruped rock to childs pose  5x Sidelying open books 10x right/left Seated Matrix row 25# 2x 10 Manual therapy: soft tissue mobilization to lumbar and gluteal musculature Trigger Point Dry Needling Subsequent Treatment: Instructions reviewed, if requested by the patient, prior to subsequent dry needling treatment.  Patient Verbal Consent Given: Yes Education Handout Provided: Previously Provided Muscles Treated: bil lumbar multifidi and gluteals  Electrical Stimulation Performed: Yes, Parameters: 1.5 ma 80 pps 8 min to bil lumbar multifidi and gluteals Treatment Response/Outcome: twitch response and improved tissue mobility  Elongation and release to bil low back and gluteals 10/24/23:  Nu-Step L4 10 min while discussing back and ex equipment options  Walking in reverse: 15# x10 Seated hamstring and figure 4 2x20  Standing kettlebell 5# rotation plyochops x15 each  Sidelying open books 10x right/left Seated Matrix row 25# 2x 10 Chops with pulley: 10# down x15 Rt and Lt Farmer's carry: 10#- 1 lap around the building in each arm   PATIENT EDUCATION:  Education details: Access Code: [email or web address], Dry needling; home TENS info Person educated: Patient Education method: Explanation Education comprehension: verbalized understanding  HOME EXERCISE PROGRAM: Access Code: [email or web address] URL: [email or web address] Date: 09/20/2023 Prepared by: Lavinia Sharps  Exercises - Supine Lower Trunk Rotation  - 3 x daily - 7 x weekly - 1 sets - 3 reps - 20 hold - Hooklying Single Knee to Chest  - 3 x daily - 7 x weekly - 1 sets - 3 reps - 20 hold - Seated Hamstring Stretch  - 3 x daily - 7 x weekly - 1 sets - 3 reps - 20 hold - Seated Piriformis Stretch with Trunk Bend  - 3 x daily - 7 x weekly - 1 sets - 3 reps - 20 hold - Sit to Stand with Arms Crossed  - 1 x daily - 7 x weekly - 1 sets - 10 reps - Standing Hamstring Stretch with Step  - 1 x daily - 7 x weekly - 1 sets - 5 reps - Standing Knee Flexion Stretch on Step  -  1 x daily - 7 x weekly - 1 sets - 5 reps - Half Deadlift with Kettlebell  - 1 x daily - 7 x weekly - 1 sets - 10 reps - Quadruped Rocking Backward  - 1 x daily - 7 x weekly - 1 sets - 10 reps - Sidelying Open Book Thoracic Rotation with Knee on Foam Roll  - 1 x daily - 7 x weekly - 1 sets - 10 reps  ASSESSMENT:  CLINICAL IMPRESSION: Therapist providing verbal cues to optimize technique with exercises but fewer cues needed overall.  Pt continues to have positive response to dry needling and manual techniques. He reports 80% overall improvement in symptoms since the start of care.  He is independent and compliant with HEP.   Jesusita Oka should meet remaining goals in the next 1-2 visits and be ready for discharge at that time.    OBJECTIVE IMPAIRMENTS: decreased activity tolerance, difficulty walking, decreased strength, increased fascial restrictions, impaired perceived functional ability, impaired flexibility, postural dysfunction, and pain.   ACTIVITY LIMITATIONS: carrying, lifting, sleeping, and locomotion level  PARTICIPATION LIMITATIONS: driving, community activity, and occupation  PERSONAL FACTORS: Past/current experiences, Time since onset of injury/illness/exacerbation, and 1-2 comorbidities: HTN; vascular issues   are also affecting patient's functional outcome.   REHAB POTENTIAL: Good  CLINICAL DECISION MAKING: Stable/uncomplicated  EVALUATION COMPLEXITY: Low   GOALS: Goals reviewed with patient? Yes  SHORT TERM GOALS: Target date: 10/09/2023    The patient will demonstrate knowledge of basic self care strategies and exercises to promote healing  Baseline: using stretching and activity modification to address symptoms (09/24/23) Goal status: MET  2.  The patient will report a 30% improvement in pain levels with functional activities which are currently difficult including sleeping and walking Baseline: 40% (09/24/23) Goal status: MET  3.  The patient will have improved trunk  flexor and extensor muscle strength to at least 4/5 needed for lifting medium weight objects such as grocery bags, laundry and luggage  Baseline: 09/24/23 (09/24/23) Goal status: MET  4.  The patient will have improved left hip strength to at least 4+/5 needed for standing, walking longer distances at home and in the community  Baseline:  Goal status: INITIAL    LONG TERM GOALS: Target date: 11/06/2023    The patient will be independent in a safe self progression of a home and/or gym exercise program to promote further recovery of function   Baseline:  Goal status: in progress   2.  The patient will report a 60% improvement in pain levels with functional activities which are currently difficult including sleeping and walking Baseline: 80-90% (10/14/23) Goal status: MET  3.  The patient will have improved trunk flexor and extensor muscle strength to at least 4+/5 needed for lifting his 3 month old granddaughter Baseline:  Goal status: INITIAL  4.  The patient will be able to walk 900 feet in 6 minutes with pain level <5/10 Baseline: 1007 with 5/10 bil leg pain Goal status: MET  5.  Modified Oswestry score improved to 31% indicating improved function with less pain Baseline: 2% (10/14/23) Goal status: MET   PLAN:  PT FREQUENCY: 2x/week  PT DURATION: 8 weeks  PLANNED INTERVENTIONS: 97164- PT Re-evaluation, 97110-Therapeutic exercises, 97530- Therapeutic activity, 97112- Neuromuscular re-education, 97535- Self Care, 16109- Manual therapy, [id number]- Aquatic Therapy, 97014- Electrical stimulation (unattended), [id number]- Electrical stimulation (manual), [id number]- Ultrasound, 60454- Traction (mechanical), [id number]- Ionotophoresis 4mg /ml Dexamethasone, Patient/Family education, Taping, Dry Needling, Joint mobilization, Spinal manipulation, Spinal mobilization, Cryotherapy, and Moist heat.  PLAN FOR NEXT SESSION:   Plan D/C next visit, dry needling with e-stim?, text walk test, finalize HEP  Lorrene Reid, PT 10/31/23 11:46 AM

## 2023-11-05 ENCOUNTER — Ambulatory Visit: Payer: 59

## 2023-11-14 ENCOUNTER — Ambulatory Visit

## 2023-11-19 NOTE — Therapy (Signed)
 OUTPATIENT PHYSICAL THERAPY  TREATMENT   Patient Name: Martin Archer MRN: [id number] DOB:Sep 09, 1960, 63 y.o., male Today's Date: 11/20/2023   END OF SESSION:  PT End of Session - 11/20/23 0855     Visit Number 14    Authorization Type UHC medicare/Medicaid secondary    PT Start Time 0847    PT Stop Time 0930    PT Time Calculation (min) 43 min    Activity Tolerance Patient tolerated treatment well    Behavior During Therapy [facility] for tasks assessed/performed                   Past Medical History:  Diagnosis Date   Anemia, unspecified    Arthritis    Diverticulosis of colon (without mention of hemorrhage)    Gastric ulcer with hemorrhage and obstruction 2011   Dr Julien Girt GI   GERD (gastroesophageal reflux disease)    hx   Hyperlipemia    Hypertension    Personal history of colonic polyps 01/30/2010   hyperplastic rectal   Sleep apnea    on CPAP;  Sleep Medicine   Stroke [facility])    mini strokes-bilateral carotid endarterectomy   Substance abuse (HCC)    History of alcohol abuse   Past Surgical History:  Procedure Laterality Date   AORTIC ARCH ANGIOGRAPHY N/A 04/26/2021   Procedure: AORTIC ARCH ANGIOGRAPHY;  Surgeon: Iran Ouch, MD;  Location: [facility] INVASIVE CV LAB;  Service: Cardiovascular;  Laterality: N/A;   CARDIAC CATHETERIZATION  01/25/2021   CAROTID ENDARTERECTOMY  11/2006   left Dr Jerilee Field   COLONOSCOPY  2011   DP-positive FOBT/IDA   ENDARTERECTOMY Right 01/19/2015   Procedure: RIGHT CAROTID ENDARTERECTOMY WITH PATCH ANGIOPLASTY;  Surgeon: Pryor Ochoa, MD;  Location: [facility] OR;  Service: Vascular;  Laterality: Right;   ENDARTERECTOMY Right 06/27/2021   Procedure: RIGHT REDO CAROTID ARTERY ENDARTERECTOMY & PLACEMENT OF 15Fr DRAIN;  Surgeon: Chuck Hint, MD;  Location: [facility] OR;  Service: Vascular;  Laterality: Right;   ESOPHAGOGASTRODUODENOSCOPY Left 06/17/2013   Procedure: ESOPHAGOGASTRODUODENOSCOPY (EGD);   Surgeon: Willis Modena, MD;  Location: [facility] ENDOSCOPY;  Service: Endoscopy;  Laterality: Left;   ESOPHAGOGASTRODUODENOSCOPY N/A 09/20/2015   Procedure: ESOPHAGOGASTRODUODENOSCOPY (EGD);  Surgeon: Meryl Dare, MD;  Location: [facility] ENDOSCOPY;  Service: Endoscopy;  Laterality: N/A;   FOOT SURGERY Bilateral 2020   Taylor's foot   INGUINAL HERNIA REPAIR  1963   NASAL FRACTURE SURGERY  1974   PATCH ANGIOPLASTY Right 06/27/2021   Procedure: PATCH ANGIOPLASTY USING Livia Snellen BIOLOGIC PATCH;  Surgeon: Chuck Hint, MD;  Location: [facility] OR;  Service: Vascular;  Laterality: Right;   PERIPHERAL VASCULAR INTERVENTION  04/26/2021   Procedure: PERIPHERAL VASCULAR INTERVENTION;  Surgeon: Iran Ouch, MD;  Location: [facility] INVASIVE CV LAB;  Service: Cardiovascular;;   RIGHT/LEFT HEART CATH AND CORONARY ANGIOGRAPHY N/A 01/25/2021   Procedure: RIGHT/LEFT HEART CATH AND CORONARY ANGIOGRAPHY;  Surgeon: Corky Crafts, MD;  Location: [facility] INVASIVE CV LAB;  Service: Cardiovascular;  Laterality: N/A;   TEE WITHOUT CARDIOVERSION N/A 01/17/2015   Procedure: TRANSESOPHAGEAL ECHOCARDIOGRAM (TEE);  Surgeon: Thurmon Fair, MD;  Location: [facility] ENDOSCOPY;  Service: Cardiovascular;  Laterality: N/A;   WISDOM TOOTH EXTRACTION     Patient Active Problem List   Diagnosis Date Noted   BMI 33.0-33.9,adult 03/08/2023   Erectile dysfunction 10/03/2022   OSA on CPAP 04/10/2022   Skin lesion of left arm 03/29/2022   Diabetes (HCC) 09/24/2021   Vitamin D deficiency 09/24/2021  Diarrhea 09/05/2021   Viral URI 09/05/2021   Sinusitis 09/05/2021   Carotid artery stenosis 06/27/2021   Intermittent pain and swelling of hand 05/29/2021   Stenosis of left subclavian artery (HCC) 04/26/2021   Chronic systolic heart failure (HCC)    Fatigue 12/19/2020   Dyspnea 12/19/2020   Spondylolisthesis of lumbosacral region 05/03/2020   CKD (chronic kidney disease) stage 4, GFR 15-29 ml/min (HCC) 09/26/2017   Syncope 09/26/2017    Elevated transaminase level 07/25/2017   Cannabis abuse 07/23/2017   Uncomplicated alcohol dependence (HCC) 07/23/2017   Melena    Gastric ulcer with hemorrhage    Right hemiparesis (HCC) 09/18/2015   Chronic blood loss anemia 09/18/2015   Hematemesis 09/18/2015   Encounter for well adult exam with abnormal findings 06/29/2015   History of CEA (carotid endarterectomy) 03/25/2015   HLD (hyperlipidemia) 03/25/2015   Tobacco use disorder 03/25/2015   Carotid artery occlusion with infarction (HCC) 03/25/2015   Carotid aneurysm, 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   Stroke (HCC)    Aortic arch atherosclerosis (HCC)    Carotid stenosis 01/16/2015   Thrombus: BILATERAL EXTERNAL JUGULAR VEINS per CT angio head and neck 01/16/15 01/16/2015   DVT (deep venous thrombosis) (HCC)    Numbness and tingling of left arm and leg 01/15/2015   Cerebral infarction (HCC)    Paresthesia 12/08/2014   Alcohol abuse, in remission 07/14/2013   ADD (attention deficit disorder) 07/14/2013   Nicotine dependence 06/18/2013   Back pain, chronic 06/17/2013   ACUTE GASTRIC ULCER W/HEMORRHAGE AND OBSTRUCTION 02/28/2010   History of colonic polyps 02/28/2010   DIVERTICULOSIS, COLON 02/02/2010   ANEMIA, SEVERE 01/13/2010   Essential hypertension 01/13/2010   Sleep apnea 01/13/2010   Left carotid bruit 01/13/2010   MIXED HEARING LOSS BILATERAL 02/16/2009    PCP: Oliver Barre MD  REFERRING PROVIDER: Clementeen Graham MD  REFERRING DIAG: [phone], G89.29 chronic bilateral low back pain with left sided sciatica; M43.00 pars defect  Rationale for Evaluation and Treatment: Rehabilitation  THERAPY DIAG:  Back pain; weakness  ONSET DATE: > 1 year  SUBJECTIVE:                                                                                                                                                                                           SUBJECTIVE STATEMENT: Lapse in treatment since 10/31/23.  I played golf and was sore the next  day but was able to resolve the pain. I feel 80% better since the start of care.    Lost 20# taking injections I play golf but I haven't swung a club in the last few months 2-3 blocks max walk  Has a gym where he lives but does not use  PERTINENT HISTORY:  L5- S1 ESI 12/11; prior ESI "several" See cardiac history above HTN Vascular issues (may need stint in leg) Numbness in both feet On blood thinners Foot surgery 28 month old Grandtr in [place]  PAIN:  Are you having pain? no NPRS scale: 0/10, not consistent, just some tightness Pain location: bil low back pain and left LE especially knee to ankle; numbness in both feet Pain orientation: Bilateral  PAIN TYPE: aching Pain description: constant  Aggravating factors: my mattress (waken with low back pain); sleeping, walking Relieving factors: fetal position usually left side; sitting (sometimes relief, sometimes not)  PRECAUTIONS: None    WEIGHT BEARING RESTRICTIONS: No  FALLS:  Has patient fallen in last 6 months? No  OCCUPATION: part-time work 4-6 hours standing   PLOF: Independent with basic ADLs  PATIENT GOALS: I hope to loosen things up; maybe play golf; how can I alleviate the pain?;  go to the gym where I live  OBJECTIVE:  Note: Objective measures were completed at Evaluation unless otherwise noted.  DIAGNOSTIC FINDINGS:  MRI Nov '24 IMPRESSION: 1. 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 There is chronic severe foraminal narrowing bilaterally and probable chronic bilateral L5 nerve root encroachment. 2. Mild spondylosis at the other lumbar levels without significant spinal stenosis or nerve root encroachment. 3. No acute osseous findings.  PATIENT SURVEYS:  Modified Oswestry 40% pt left glasses in the car so questionnaire done verbally and was time consuming  10/14/23: Modified Oswestry 2%   COGNITION: Overall cognitive status: Within  functional limits for tasks assessed     MUSCLE LENGTH: Moderate decrease in HS and hip flexor lengths bilaterally POSTURE: decreased lumbar lordosis  PALPATION: Decreased fascial mobility in lumbar region bil  LUMBAR ROM:   AROM eval 2/25  Flexion 6 inches from floor WFLs  Extension 10 degrees WFLs  Right lateral flexion 20 degrees WFLs  Left lateral flexion 20 degrees WFLs  Right rotation    Left rotation     (Blank rows = not tested) TRUNK STRENGTH:  Decreased activation of transverse abdominus muscles; abdominals 4-/5; decreased activation of lumbar multifidi; trunk extensors 4-/5  LOWER EXTREMITY ROM:   grossly WFLs except decreased hip external rotation bil left more limited than right  LOWER EXTREMITY MMT:      MMT Right eval Left eval 2/25  Hip flexion 4+ 4+ 5/5  Hip extension 4+ 4+ 5/5  Hip abduction 4 4- 4+/4+  Hip adduction     Hip internal rotation     Hip external rotation     Knee flexion     Knee extension 4+ 4+ 5/5  Ankle dorsiflexion     Ankle plantarflexion 4 4 4+/4+  Ankle inversion     Ankle eversion      (Blank rows = not tested)  LUMBAR SPECIAL TESTS:  Slump test: Negative  FUNCTIONAL TESTS:  Able to rise from the chair without UE assist Able to stoop to pick up small object with ease SLS < 3 sec bilaterally; much less stable on left with pelvic drop 09/12/23: 6 min walk test: 45- age related norm is 1745  GAIT:  Comments: WFLs for short distances   TREATMENT DATE:   11/19/23:  Nu-Step L4 10 min while discussing back and ex equipment options  Walking in reverse: 15# x10 Seated hamstring and figure 4 2x20  Standing kettlebell 5# rotation plyochops  x15 each  Sidelying open books 10x right/left Deadlift 5# x10 Hip flexor stretch on step 3x20 seconds  Farmer's carry: 10#- 1 lap around the building in each arm 10/31/23:  Nu-Step L4 10 min while discussing back and ex equipment options  Walking in reverse: 15# x10 Seated hamstring and  figure 4 2x20  Standing kettlebell 5# rotation plyochops x15 each  Sidelying open books 10x right/left Seated Matrix row 25# 2x 10 Chops with pulley: 10# down x15 Rt and Lt Farmer's carry: 10#- 1 lap around the building in each arm  10/29/23:  bike 8 min while discussing current status treatment plan Seated hamstring  2x20  Quadruped rock to childs pose 5x Sidelying open books 10x right/left Seated Matrix row 25# 2x 10 Manual therapy: soft tissue mobilization to lumbar and gluteal musculature Trigger Point Dry Needling Subsequent Treatment: Instructions reviewed, if requested by the patient, prior to subsequent dry needling treatment.  Patient Verbal Consent Given: Yes Education Handout Provided: Previously Provided Muscles Treated: bil lumbar multifidi and gluteals  Electrical Stimulation Performed: Yes, Parameters: 1.5 ma 80 pps 8 min to bil lumbar multifidi and gluteals Treatment Response/Outcome: twitch response and improved tissue mobility  Elongation and release to bil low back and gluteals   PATIENT EDUCATION:  Education details: Access Code: [email or web address], Dry needling; home TENS info Person educated: Patient Education method: Explanation Education comprehension: verbalized understanding  HOME EXERCISE PROGRAM: Access Code: [email or web address] URL: [email or web address] Date: 09/20/2023 Prepared by: Lavinia Sharps  Exercises - Supine Lower Trunk Rotation  - 3 x daily - 7 x weekly - 1 sets - 3 reps - 20 hold - Hooklying Single Knee to Chest  - 3 x daily - 7 x weekly - 1 sets - 3 reps - 20 hold - Seated Hamstring Stretch  - 3 x daily - 7 x weekly - 1 sets - 3 reps - 20 hold - Seated Piriformis Stretch with Trunk Bend  - 3 x daily - 7 x weekly - 1 sets - 3 reps - 20 hold - Sit to Stand with Arms Crossed  - 1 x daily - 7 x weekly - 1 sets - 10 reps - Standing Hamstring Stretch with Step  - 1 x daily - 7 x weekly - 1 sets - 5 reps - Standing Knee Flexion Stretch on Step  - 1  x daily - 7 x weekly - 1 sets - 5 reps - Half Deadlift with Kettlebell  - 1 x daily - 7 x weekly - 1 sets - 10 reps - Quadruped Rocking Backward  - 1 x daily - 7 x weekly - 1 sets - 10 reps - Sidelying Open Book Thoracic Rotation with Knee on Foam Roll  - 1 x daily - 7 x weekly - 1 sets - 10 reps  ASSESSMENT:  CLINICAL IMPRESSION: Lapse in treatment due to pt working more.  He hasn't been as consistent with his exercise program and we discussed how to remain consistent after D/C.  Reviewed all HEP with pt today.  Pt played golf and had some soreness after this and this resolved quickly. He reports 80-90% overall improvement in symptoms since the start of care.     OBJECTIVE IMPAIRMENTS: decreased activity tolerance, difficulty walking, decreased strength, increased fascial restrictions, impaired perceived functional ability, impaired flexibility, postural dysfunction, and pain.   ACTIVITY LIMITATIONS: carrying, lifting, sleeping, and locomotion level  PARTICIPATION LIMITATIONS: driving, community activity, and occupation  PERSONAL FACTORS: Past/current experiences, Time since onset of injury/illness/exacerbation, and  1-2 comorbidities: HTN; vascular issues   are also affecting patient's functional outcome.   REHAB POTENTIAL: Good  CLINICAL DECISION MAKING: Stable/uncomplicated  EVALUATION COMPLEXITY: Low   GOALS: Goals reviewed with patient? Yes  SHORT TERM GOALS: Target date: 10/09/2023    The patient will demonstrate knowledge of basic self care strategies and exercises to promote healing  Baseline: using stretching and activity modification to address symptoms (09/24/23) Goal status: MET  2.  The patient will report a 30% improvement in pain levels with functional activities which are currently difficult including sleeping and walking Baseline: 40% (09/24/23) Goal status: MET  3.  The patient will have improved trunk flexor and extensor muscle strength to at least 4/5 needed  for lifting medium weight objects such as grocery bags, laundry and luggage  Baseline: 09/24/23 (09/24/23) Goal status: MET  4.  The patient will have improved left hip strength to at least 4+/5 needed for standing, walking longer distances at home and in the community  Baseline: No limits with walking (11/20/23) Goal status: MET    LONG TERM GOALS: Target date: 11/20/2023    The patient will be independent in a safe self progression of a home and/or gym exercise program to promote further recovery of function   Baseline:  Goal status: MET  2.  The patient will report a 60% improvement in pain levels with functional activities which are currently difficult including sleeping and walking Baseline: 80-90% (10/14/23) Goal status: MET  3.  The patient will have improved trunk flexor and extensor muscle strength to at least 4+/5 needed for lifting his 5 month old granddaughter Baseline:  Goal status: MET  4.  The patient will be able to walk 900 feet in 6 minutes with pain level <5/10 Baseline: 1007 with 5/10 bil leg pain Goal status: MET  5.  Modified Oswestry score improved to 31% indicating improved function with less pain Baseline: 2% (10/14/23) Goal status: MET   PLAN:  PHYSICAL THERAPY DISCHARGE SUMMARY  Visits from Start of Care: 14  Current functional level related to goals / functional outcomes: Pt reports 80-90% overall improvement since the start of care.  Has played golf with soreness only after.     Remaining deficits: Intermittent and mild LBP and stiffness with activity.  He is able to resolve with Tylenol and exercise   Education / Equipment: HEP, body mechanics    Patient agrees to discharge. Patient goals were met. Patient is being discharged due to meeting the stated rehab goals.   Lorrene Reid, PT 11/20/23 9:31 AM

## 2023-11-20 ENCOUNTER — Ambulatory Visit

## 2023-11-20 DIAGNOSIS — M5432 Sciatica, left side: Secondary | ICD-10-CM

## 2023-11-20 DIAGNOSIS — M6281 Muscle weakness (generalized): Secondary | ICD-10-CM

## 2023-11-30 ENCOUNTER — Other Ambulatory Visit: Payer: Self-pay | Admitting: Internal Medicine

## 2023-12-02 ENCOUNTER — Other Ambulatory Visit: Payer: Self-pay

## 2024-01-27 ENCOUNTER — Other Ambulatory Visit: Payer: Self-pay | Admitting: Family Medicine

## 2024-01-27 NOTE — Telephone Encounter (Signed)
 Last OV 08/30/23 Next OV not scheduled  Last refill 09/30/23 Qty # 90/3

## 2024-01-30 ENCOUNTER — Other Ambulatory Visit: Payer: Self-pay | Admitting: Nurse Practitioner

## 2024-02-04 LAB — LAB REPORT - SCANNED
Albumin, Urine POC: 15.3
EGFR: 37
Microalb Creat Ratio: 19

## 2024-02-11 ENCOUNTER — Other Ambulatory Visit: Payer: Self-pay | Admitting: Interventional Cardiology

## 2024-02-25 ENCOUNTER — Ambulatory Visit (INDEPENDENT_AMBULATORY_CARE_PROVIDER_SITE_OTHER): Payer: 59

## 2024-02-25 VITALS — Ht 68.0 in | Wt 180.0 lb

## 2024-02-25 DIAGNOSIS — Z0000 Encounter for general adult medical examination without abnormal findings: Secondary | ICD-10-CM | POA: Diagnosis not present

## 2024-02-25 NOTE — Patient Instructions (Signed)
 Mr. Chalfin , Thank you for taking time out of your busy schedule to complete your Annual Wellness Visit with me. I enjoyed our conversation and look forward to speaking with you again next year. I, as well as your care team,  appreciate your ongoing commitment to your health goals. Please review the following plan we discussed and let me know if I can assist you in the future. Your Game plan/ To Do List     Follow up Visits: Next Medicare AWV with our clinical staff: 03/01/2025 with Nurse Health Advisor at 8:50am by telephone   Have you seen your provider in the last 6 months (3 months if uncontrolled diabetes)? No Next Office Visit with your provider: Sept 3rd @ 10:20AM with Dr Lynwood Rush (Please keep appointment to have labwork and discuss CPAP equipment order).  Your last appointment was in 2024.    Clinician Recommendations:  Aim for 30 minutes of exercise or brisk walking, 6-8 glasses of water, and 5 servings of fruits and vegetables each day. Educated and advised on getting the Pneumonia, Tdap (Tetenus), COVID, and Shingles vaccines at local pharmacy.        This is a list of the screening recommended for you and due dates:  Health Maintenance  Topic Date Due   Zoster (Shingles) Vaccine (1 of 2) Never done   Pneumococcal Vaccine for age over 16 (2 of 2 - PCV) 08/01/2018   COVID-19 Vaccine (4 - 2024-25 season) 04/28/2023   Eye exam for diabetics  08/07/2023   Complete foot exam   10/02/2023   DTaP/Tdap/Td vaccine (3 - Tdap) 10/03/2023   Hemoglobin A1C  03/15/2024   Flu Shot  03/27/2024   Yearly kidney function blood test for diabetes  02/03/2025   Yearly kidney health urinalysis for diabetes  02/03/2025   Medicare Annual Wellness Visit  02/24/2025   Colon Cancer Screening  03/11/2026   Hepatitis C Screening  Completed   HIV Screening  Completed   Hepatitis B Vaccine  Aged Out   HPV Vaccine  Aged Out   Meningitis B Vaccine  Aged Out    Advanced directives: (Copy Requested)  Please bring a copy of your health care power of attorney and living will to the office to be added to your chart at your convenience. You can mail to [facility] [address] [place], [place] [address] or email to [email or web address] Advance Care Planning is important because it:  [x]  Makes sure you receive the medical care that is consistent with your values, goals, and preferences  [x]  It provides guidance to your family and loved ones and reduces their decisional burden about whether or not they are making the right decisions based on your wishes.  Follow the link provided in your after visit summary or read over the paperwork we have mailed to you to help you started getting your Advance Directives in place. If you need assistance in completing these, please reach out to us  so that we can help you!

## 2024-02-25 NOTE — Progress Notes (Signed)
 Subjective:   Martin Archer is a 63 y.o. who presents for a Medicare Wellness preventive visit.  As a reminder, Annual Wellness Visits don't include a physical exam, and some assessments may be limited, especially if this visit is performed virtually. We may recommend an in-person follow-up visit with your provider if needed.  Visit Complete: Virtual I connected with  Martin Archer on 02/25/24 by a audio enabled telemedicine application and verified that I am speaking with the correct person using two identifiers.  Patient Location: Home  Provider Location: Office/Clinic  I discussed the limitations of evaluation and management by telemedicine. The patient expressed understanding and agreed to proceed.  Vital Signs: Because this visit was a virtual/telehealth visit, some criteria may be missing or patient reported. Any vitals not documented were not able to be obtained and vitals that have been documented are patient reported.  VideoDeclined- This patient declined Librarian, academic. Therefore the visit was completed with audio only.  Persons Participating in Visit: Patient.  AWV Questionnaire: Yes: Patient Medicare AWV questionnaire was completed by the patient on 02/25/2024 (partial); I have confirmed that all information answered by patient is correct and no changes since this date.  Cardiac Risk Factors include: advanced age (>99men, >31 women);diabetes mellitus;dyslipidemia;hypertension;male gender     Objective:    Today's Vitals   02/25/24 0851  Weight: 180 lb (81.6 kg)  Height: 5' 8 (1.727 m)   Body mass index is 27.37 kg/m.     02/25/2024    8:51 AM 09/11/2023    8:55 AM 02/19/2023    9:53 AM 03/22/2022    8:55 AM 06/23/2021   10:34 AM 04/26/2021    3:00 PM 04/26/2021    6:26 AM  Advanced Directives  Does Patient Have a Medical Advance Directive? Yes No Yes Yes Yes Yes Yes  Type of Estate agent of  [place];Living will  Healthcare Power of [place];Living will Living will;Healthcare Power of [employer] Power of [place];Living will Healthcare Power of [place];Living will Healthcare Power of [place];Living will  Does patient want to make changes to medical advance directive?    No - Patient declined No - Patient declined No - Patient declined   Copy of Healthcare Power of Attorney in Chart? No - copy requested  No - copy requested No - copy requested  No - copy requested   Would patient like information on creating a medical advance directive?  No - Patient declined         Current Medications (verified) Outpatient Encounter Medications as of 02/25/2024  Medication Sig   acetaminophen  (TYLENOL ) 500 MG tablet Take 500 mg by mouth every 6 (six) hours as needed.   amLODipine  (NORVASC ) 5 MG tablet Take 1 tablet (5 mg total) by mouth daily.   aspirin  EC 81 MG tablet Take 1 tablet (81 mg total) by mouth daily. Swallow whole.   atorvastatin  (LIPITOR ) 80 MG tablet TAKE 1 TABLET BY MOUTH DAILY   Carboxymethylcellul-Glycerin (LUBRICATING EYE DROPS OP) Place 1 drop into both eyes daily as needed (dry eyes).   carvedilol  (COREG ) 25 MG tablet TAKE 1 TABLET(25 MG) BY MOUTH TWICE DAILY   clopidogrel  (PLAVIX ) 75 MG tablet TAKE 1 TABLET BY MOUTH DAILY WITH BREAKFAST   doxylamine , Sleep, (UNISOM ) 25 MG tablet Take 25 mg by mouth at bedtime as needed for sleep.   Dulaglutide  (TRULICITY ) 0.75 MG/0.5ML SOPN Inject 0.75 mg into the skin once a week.   ezetimibe  (ZETIA ) 10 MG  tablet TAKE 1 TABLET(10 MG) BY MOUTH DAILY   furosemide  (LASIX ) 20 MG tablet TAKE 1 TABLET(20 MG) BY MOUTH DAILY AS NEEDED FOR LOWER EXTREMITY SWELLING   gabapentin  (NEURONTIN ) 300 MG capsule TAKE 1 CAPSULE(300 MG) BY MOUTH THREE TIMES DAILY AS NEEDED   JARDIANCE  10 MG TABS tablet TAKE 1 TABLET(10 MG) BY MOUTH DAILY BEFORE BREAKFAST   MOUNJARO  5 MG/0.5ML Pen ADMINISTER 5 MG UNDER THE SKIN 1 TIME A WEEK   Multiple Vitamin  (MULTIVITAMIN WITH MINERALS) TABS tablet Take 1 tablet by mouth daily.   sacubitril -valsartan  (ENTRESTO ) 97-103 MG TAKE 1 TABLET BY MOUTH TWICE DAILY   sildenafil  (VIAGRA ) 100 MG tablet Take 0.5-1 tablets (50-100 mg total) by mouth daily as needed for erectile dysfunction.   triamcinolone cream (KENALOG) 0.1 % Apply topically 2 (two) times daily as needed.   No facility-administered encounter medications on file as of 02/25/2024.    Allergies (verified) Adderall [amphetamine-dextroamphet er], Prozac  [fluoxetine  hcl], Sertraline hcl, and Advil [ibuprofen]   History: Past Medical History:  Diagnosis Date   Anemia, unspecified    Arthritis    Diverticulosis of colon (without mention of hemorrhage)    Gastric ulcer with hemorrhage and obstruction 2011   Dr Jakie Finn [facility]   GERD (gastroesophageal reflux disease)    hx   Hyperlipemia    Hypertension    Personal history of colonic polyps 01/30/2010   hyperplastic rectal   Sleep apnea    on CPAP; [facility]   Stroke [facility])    mini strokes-bilateral carotid endarterectomy   Substance abuse (HCC)    History of alcohol  abuse   Past Surgical History:  Procedure Laterality Date   AORTIC ARCH ANGIOGRAPHY N/A 04/26/2021   Procedure: AORTIC ARCH ANGIOGRAPHY;  Surgeon: Darron Deatrice LABOR, MD;  Location: [facility] INVASIVE CV LAB;  Service: Cardiovascular;  Laterality: N/A;   CARDIAC CATHETERIZATION  01/25/2021   CAROTID ENDARTERECTOMY  11/2006   left Dr JINNY JONETTA Collum   COLONOSCOPY  2011   DP-positive FOBT/IDA   ENDARTERECTOMY Right 01/19/2015   Procedure: RIGHT CAROTID ENDARTERECTOMY WITH PATCH ANGIOPLASTY;  Surgeon: Lynwood JONETTA Collum, MD;  Location: [facility] OR;  Service: Vascular;  Laterality: Right;   ENDARTERECTOMY Right 06/27/2021   Procedure: RIGHT REDO CAROTID ARTERY ENDARTERECTOMY & PLACEMENT OF 15Fr DRAIN;  Surgeon: Eliza Lonni RAMAN, MD;  Location: [facility] OR;  Service: Vascular;  Laterality: Right;   ESOPHAGOGASTRODUODENOSCOPY Left  06/17/2013   Procedure: ESOPHAGOGASTRODUODENOSCOPY (EGD);  Surgeon: Elsie Cree, MD;  Location[facility] ENDOSCOPY;  Service: Endoscopy;  Laterality: Left;   ESOPHAGOGASTRODUODENOSCOPY N/A 09/20/2015   Procedure: ESOPHAGOGASTRODUODENOSCOPY (EGD);  Surgeon: Gwendlyn ONEIDA Buddy, MD;  Location: [facility] ENDOSCOPY;  Service: Endoscopy;  Laterality: N/A;   FOOT SURGERY Bilateral 2020   Taylor's foot   INGUINAL HERNIA REPAIR  1963   NASAL FRACTURE SURGERY  1974   PATCH ANGIOPLASTY Right 06/27/2021   Procedure: PATCH ANGIOPLASTY USING GEORGE BIOLOGIC PATCH;  Surgeon: Eliza Lonni RAMAN, MD;  Location: [facility] OR;  Service: Vascular;  Laterality: Right;   PERIPHERAL VASCULAR INTERVENTION  04/26/2021   Procedure: PERIPHERAL VASCULAR INTERVENTION;  Surgeon: Darron Deatrice LABOR, MD;  Location: [facility] INVASIVE CV LAB;  Service: Cardiovascular;;   RIGHT/LEFT HEART CATH AND CORONARY ANGIOGRAPHY N/A 01/25/2021   Procedure: RIGHT/LEFT HEART CATH AND CORONARY ANGIOGRAPHY;  Surgeon: Dann Candyce RAMAN, MD;  Location: [facility] INVASIVE CV LAB;  Service: Cardiovascular;  Laterality: N/A;   TEE WITHOUT CARDIOVERSION N/A 01/17/2015   Procedure: TRANSESOPHAGEAL ECHOCARDIOGRAM (TEE);  Surgeon: Jerel Balding, MD;  Location: [facility] ENDOSCOPY;  Service: Cardiovascular;  Laterality: N/A;   WISDOM TOOTH EXTRACTION     Family History  Problem Relation Age of Onset   Diabetes Father    Hypertension Father    Stroke Father        in late 29s   Heart attack Mother        in 70s   Heart attack Brother 54   Diabetes Paternal Grandfather    Cancer Maternal Grandfather        unknown type   Heart attack Maternal Grandfather 59   Stroke Paternal Uncle        in late 60s   Colon cancer Neg Hx    Esophageal cancer Neg Hx    Stomach cancer Neg Hx    Colon polyps Neg Hx    Rectal cancer Neg Hx    Social History   Socioeconomic History   Marital status: Divorced    Spouse name: Not on file   Number of children: 2   Years of education: Not  on file   Highest education level: Associate degree: academic program  Occupational History   Occupation: self employeed Investment banker, corporate: [employer]   Tobacco Use   Smoking status: Former    Current packs/day: 0.00    Types: Cigarettes    Quit date: 06/22/2015    Years since quitting: 8.6   Smokeless tobacco: Never   Tobacco comments:    smoked 1976-2007 & 2009- present , up to 2 ppd, average > 1ppd  Vaping Use   Vaping status: Never Used  Substance and Sexual Activity   Alcohol  use: Not Currently    Alcohol /week: 0.0 standard drinks of alcohol    Drug use: No   Sexual activity: Yes  Other Topics Concern   Not on file  Social History Narrative   Lives alone.  Daughter lives in area.    Social Drivers of Corporate investment banker Strain: Low Risk  (02/25/2024)   Overall Financial Resource Strain (CARDIA)    Difficulty of Paying Living Expenses: Not hard at all  Food Insecurity: No Food Insecurity (02/25/2024)   Hunger Vital Sign    Worried About Running Out of Food in the Last Year: Never true    Ran Out of Food in the Last Year: Never true  Transportation Needs: No Transportation Needs (02/25/2024)   PRAPARE - Administrator, Civil Service (Medical): No    Lack of Transportation (Non-Medical): No  Physical Activity: Insufficiently Active (02/25/2024)   Exercise Vital Sign    Days of Exercise per Week: 3 days    Minutes of Exercise per Session: 30 min  Stress: No Stress Concern Present (02/25/2024)   [employer] of Occupational Health - Occupational Stress Questionnaire    Feeling of Stress: Not at all  Social Connections: Moderately Integrated (02/25/2024)   Social Connection and Isolation Panel    Frequency of Communication with Friends and Family: More than three times a week    Frequency of Social Gatherings with Friends and Family: Once a week    Attends Religious Services: More than 4 times per year    Active Member of [employer] or Organizations: Yes     Attends Banker Meetings: More than 4 times per year    Marital Status: Divorced    Tobacco Counseling Counseling given: No Tobacco comments: smoked 1976-2007 & 2009- present , up to 2 ppd, average > 1ppd  Clinical Intake:  Pre-visit preparation completed: Yes  Pain : No/denies pain     BMI - recorded: 27.37 Nutritional Status: BMI 25 -29 Overweight Nutritional Risks: None Diabetes: Yes CBG done?: No Did pt. bring in CBG monitor from home?: No  Lab Results  Component Value Date   HGBA1C 5.5 09/16/2023   HGBA1C 5.9 10/01/2022   HGBA1C 6.2 03/28/2022     How often do you need to have someone help you when you read instructions, pamphlets, or other written materials from your doctor or pharmacy?: 1 - Never  Interpreter Needed?: No  Information entered by :: Verdie Saba, CMA   Activities of Daily Living     02/25/2024    8:55 AM  In your present state of health, do you have any difficulty performing the following activities:  Hearing? 0  Comment wears hearing aids  Vision? 0  Difficulty concentrating or making decisions? 0  Walking or climbing stairs? 0  Dressing or bathing? 0  Doing errands, shopping? 0  Preparing Food and eating ? N  Using the Toilet? N  In the past six months, have you accidently leaked urine? N  Do you have problems with loss of bowel control? N  Managing your Medications? N  Managing your Finances? N  Housekeeping or managing your Housekeeping? N    Patient Care Team: Norleen Lynwood ORN, MD as PCP - General (Internal Medicine) Dann Candyce RAMAN, MD as PCP - Cardiology (Cardiology) Darron Deatrice LABOR, MD as PCP - [facility] (Cardiology) [facility] as Consulting Physician (Optometry)  I have updated your Care Teams any recent Medical Services you may have received from other providers in the past year.     Assessment:   This is a routine wellness examination for [place].  Hearing/Vision  screen Hearing Screening - Comments:: Wears hearing aids Vision Screening - Comments:: Wears rx glasses - up to date with routine eye exams with [facility]   Goals Addressed               This Visit's Progress     Patient Stated (pt-stated)        Patient stated he plans to continue exercising doing PT exercising - staying active       Depression Screen     02/25/2024    8:57 AM 02/19/2023    9:54 AM 10/01/2022   10:10 AM 03/28/2022    3:38 PM 03/22/2022    9:00 AM 09/20/2021   11:08 AM 12/19/2020   11:39 AM  PHQ 2/9 Scores  PHQ - 2 Score 0 0 0 0 0 0 0  PHQ- 9 Score 0 0 0 0       Fall Risk     02/25/2024    8:56 AM 02/19/2023    9:57 AM 02/16/2023    5:32 PM 10/01/2022   10:35 AM 10/01/2022   10:10 AM  Fall Risk   Falls in the past year? 0 0 0 0 0  Number falls in past yr: 0 0  0 0  Injury with Fall? 0 0   0  Risk for fall due to : No Fall Risks No Fall Risks   No Fall Risks  Follow up Falls evaluation completed;Falls prevention discussed Falls prevention discussed   Falls evaluation completed    MEDICARE RISK AT HOME:  Medicare Risk at Home Any stairs in or around the home?: Yes If so, are there any without handrails?: Yes Home free of loose throw rugs  in walkways, pet beds, electrical cords, etc?: Yes Adequate lighting in your home to reduce risk of falls?: Yes Life alert?: No Use of a cane, walker or w/c?: No Grab bars in the bathroom?: Yes Shower chair or bench in shower?: No Elevated toilet seat or a handicapped toilet?: No  TIMED UP AND GO:  Was the test performed?  No  Cognitive Function: 6CIT completed        02/25/2024    8:59 AM 02/19/2023    9:58 AM 03/22/2022    9:00 AM  6CIT Screen  What Year? 0 points 0 points 0 points  What month? 0 points 0 points 0 points  What time? 0 points 0 points 0 points  Count back from 20 0 points 0 points 0 points  Months in reverse 0 points 0 points 0 points  Repeat phrase 2 points 0 points 0 points  Total Score 2  points 0 points 0 points    Immunizations Immunization History  Administered Date(s) Administered   Influenza Whole 06/28/2010   Influenza,inj,Quad PF,6+ Mos 07/25/2017, 05/20/2019, 05/29/2021   Influenza-Unspecified 08/06/2022   Moderna Sars-Covid-2 Vaccination 12/25/2019, 01/22/2020, 06/20/2020   Pneumococcal Polysaccharide-23 08/01/2017   Td 08/27/2001, 10/02/2013    Screening Tests Health Maintenance  Topic Date Due   Zoster Vaccines- Shingrix (1 of 2) Never done   Pneumococcal Vaccine: 50+ Years (2 of 2 - PCV) 08/01/2018   COVID-19 Vaccine (4 - 2024-25 season) 04/28/2023   FOOT EXAM  10/02/2023   DTaP/Tdap/Td (3 - Tdap) 10/03/2023   HEMOGLOBIN A1C  03/15/2024   INFLUENZA VACCINE  03/27/2024   OPHTHALMOLOGY EXAM  12/29/2024   Diabetic kidney evaluation - eGFR measurement  02/03/2025   Diabetic kidney evaluation - Urine ACR  02/03/2025   Medicare Annual Wellness (AWV)  02/24/2025   Colonoscopy  03/11/2026   Hepatitis C Screening  Completed   HIV Screening  Completed   Hepatitis B Vaccines  Aged Out   HPV VACCINES  Aged Out   Meningococcal B Vaccine  Aged Out    Health Maintenance  Health Maintenance Due  Topic Date Due   Zoster Vaccines- Shingrix (1 of 2) Never done   Pneumococcal Vaccine: 50+ Years (2 of 2 - PCV) 08/01/2018   COVID-19 Vaccine (4 - 2024-25 season) 04/28/2023   FOOT EXAM  10/02/2023   DTaP/Tdap/Td (3 - Tdap) 10/03/2023   Health Maintenance Items Addressed: 02/25/2024   Additional Screening:  Vision Screening: Recommended annual ophthalmology exams for early detection of glaucoma and other disorders of the eye. Would you like a referral to an eye doctor? No  Patient stated had a diabetic eye exam with [facility] in 2025.  Dental Screening: Recommended annual dental exams for proper oral hygiene  Community Resource Referral / Chronic Care Management: CRR required this visit?  No   CCM required this visit?  No   Plan:    I have personally  reviewed and noted the following in the patient's chart:   Medical and social history Use of alcohol , tobacco or illicit drugs  Current medications and supplements including opioid prescriptions. Patient is not currently taking opioid prescriptions. Functional ability and status Nutritional status Physical activity Advanced directives List of other physicians Hospitalizations, surgeries, and ER visits in previous 12 months Vitals Screenings to include cognitive, depression, and falls Referrals and appointments  In addition, I have reviewed and discussed with patient certain preventive protocols, quality metrics, and best practice recommendations. A written personalized care plan for preventive services as  well as general preventive health recommendations were provided to patient.   Verdie CHRISTELLA Saba, CMA   02/25/2024   After Visit Summary: (Mail) Due to this being a telephonic visit, the after visit summary with patients personalized plan was offered to patient via mail   Notes: Scheduled an appt w/PCP.  Last seen in 2024.  Scheduled an appt for 04/29/2024 at 10:20am.

## 2024-03-17 ENCOUNTER — Ambulatory Visit: Admitting: Cardiology

## 2024-04-13 ENCOUNTER — Other Ambulatory Visit: Payer: Self-pay | Admitting: Nurse Practitioner

## 2024-04-29 ENCOUNTER — Ambulatory Visit: Admitting: Internal Medicine

## 2024-04-29 ENCOUNTER — Ambulatory Visit: Payer: Self-pay | Admitting: Internal Medicine

## 2024-04-29 ENCOUNTER — Encounter: Payer: Self-pay | Admitting: Internal Medicine

## 2024-04-29 VITALS — BP 108/72 | HR 95 | Temp 97.8°F | Ht 68.0 in | Wt 184.4 lb

## 2024-04-29 DIAGNOSIS — E1165 Type 2 diabetes mellitus with hyperglycemia: Secondary | ICD-10-CM | POA: Diagnosis not present

## 2024-04-29 DIAGNOSIS — Z7985 Long-term (current) use of injectable non-insulin antidiabetic drugs: Secondary | ICD-10-CM

## 2024-04-29 DIAGNOSIS — G4730 Sleep apnea, unspecified: Secondary | ICD-10-CM | POA: Diagnosis not present

## 2024-04-29 DIAGNOSIS — Z0001 Encounter for general adult medical examination with abnormal findings: Secondary | ICD-10-CM

## 2024-04-29 DIAGNOSIS — Z125 Encounter for screening for malignant neoplasm of prostate: Secondary | ICD-10-CM | POA: Diagnosis not present

## 2024-04-29 DIAGNOSIS — E538 Deficiency of other specified B group vitamins: Secondary | ICD-10-CM | POA: Diagnosis not present

## 2024-04-29 DIAGNOSIS — Z0000 Encounter for general adult medical examination without abnormal findings: Secondary | ICD-10-CM

## 2024-04-29 DIAGNOSIS — I10 Essential (primary) hypertension: Secondary | ICD-10-CM

## 2024-04-29 DIAGNOSIS — E7800 Pure hypercholesterolemia, unspecified: Secondary | ICD-10-CM

## 2024-04-29 DIAGNOSIS — N184 Chronic kidney disease, stage 4 (severe): Secondary | ICD-10-CM

## 2024-04-29 DIAGNOSIS — E559 Vitamin D deficiency, unspecified: Secondary | ICD-10-CM

## 2024-04-29 DIAGNOSIS — Z7984 Long term (current) use of oral hypoglycemic drugs: Secondary | ICD-10-CM

## 2024-04-29 LAB — HEMOGLOBIN A1C: Hgb A1c MFr Bld: 6 % (ref 4.6–6.5)

## 2024-04-29 LAB — LIPID PANEL
Cholesterol: 110 mg/dL (ref 0–200)
HDL: 41 mg/dL (ref >39.00)
LDL Cholesterol: 50 mg/dL (ref 0–99)
NonHDL: 69.49
Total CHOL/HDL Ratio: 3
Triglycerides: 99 mg/dL (ref 0.0–149.0)
VLDL: 19.8 mg/dL (ref 0.0–40.0)

## 2024-04-29 LAB — HEPATIC FUNCTION PANEL
ALT: 16 U/L (ref 0–53)
AST: 21 U/L (ref 0–37)
Albumin: 4.2 g/dL (ref 3.5–5.2)
Alkaline Phosphatase: 96 U/L (ref 39–117)
Bilirubin, Direct: 0.1 mg/dL (ref 0.0–0.3)
Total Bilirubin: 0.5 mg/dL (ref 0.2–1.2)
Total Protein: 6.8 g/dL (ref 6.0–8.3)

## 2024-04-29 LAB — URINALYSIS, ROUTINE W REFLEX MICROSCOPIC
Bilirubin Urine: NEGATIVE
Hgb urine dipstick: NEGATIVE
Ketones, ur: NEGATIVE
Leukocytes,Ua: NEGATIVE
Nitrite: NEGATIVE
RBC / HPF: NONE SEEN (ref 0–?)
Specific Gravity, Urine: 1.01 (ref 1.000–1.030)
Total Protein, Urine: NEGATIVE
Urine Glucose: >=1000 — AB
Urobilinogen, UA: 0.2 (ref 0.0–1.0)
WBC, UA: NONE SEEN (ref 0–?)
pH: 6 (ref 5.0–8.0)

## 2024-04-29 LAB — BASIC METABOLIC PANEL WITH GFR
BUN: 35 mg/dL — ABNORMAL HIGH (ref 6–23)
CO2: 27 meq/L (ref 19–32)
Calcium: 8.8 mg/dL (ref 8.4–10.5)
Chloride: 102 meq/L (ref 96–112)
Creatinine, Ser: 1.85 mg/dL — ABNORMAL HIGH (ref 0.40–1.50)
GFR: 37.81 mL/min — ABNORMAL LOW (ref >60.00)
Glucose, Bld: 78 mg/dL (ref 70–99)
Potassium: 5.1 meq/L (ref 3.5–5.1)
Sodium: 138 meq/L (ref 135–145)

## 2024-04-29 LAB — CBC WITH DIFFERENTIAL/PLATELET
Basophils Absolute: 0.1 K/uL (ref 0.0–0.1)
Basophils Relative: 0.5 % (ref 0.0–3.0)
Eosinophils Absolute: 0.2 K/uL (ref 0.0–0.7)
Eosinophils Relative: 1.6 % (ref 0.0–5.0)
HCT: 41.1 % (ref 39.0–52.0)
Hemoglobin: 13.8 g/dL (ref 13.0–17.0)
Lymphocytes Relative: 15.2 % (ref 12.0–46.0)
Lymphs Abs: 1.6 K/uL (ref 0.7–4.0)
MCHC: 33.6 g/dL (ref 30.0–36.0)
MCV: 92 fl (ref 78.0–100.0)
Monocytes Absolute: 1.1 K/uL — ABNORMAL HIGH (ref 0.1–1.0)
Monocytes Relative: 9.7 % (ref 3.0–12.0)
Neutro Abs: 7.9 K/uL — ABNORMAL HIGH (ref 1.4–7.7)
Neutrophils Relative %: 73 % (ref 43.0–77.0)
Platelets: 274 K/uL (ref 150.0–400.0)
RBC: 4.47 Mil/uL (ref 4.22–5.81)
RDW: 13.8 % (ref 11.5–15.5)
WBC: 10.9 K/uL — ABNORMAL HIGH (ref 4.0–10.5)

## 2024-04-29 LAB — MICROALBUMIN / CREATININE URINE RATIO
Creatinine,U: 68.5 mg/dL
Microalb Creat Ratio: 23.2 mg/g (ref 0.0–30.0)
Microalb, Ur: 1.6 mg/dL (ref 0.0–1.9)

## 2024-04-29 LAB — VITAMIN B12: Vitamin B-12: 407 pg/mL (ref 211–911)

## 2024-04-29 LAB — PSA: PSA: 2.01 ng/mL (ref 0.10–4.00)

## 2024-04-29 LAB — TSH: TSH: 1.62 u[IU]/mL (ref 0.35–5.50)

## 2024-04-29 LAB — VITAMIN D 25 HYDROXY (VIT D DEFICIENCY, FRACTURES): VITD: 51.04 ng/mL (ref 30.00–100.00)

## 2024-04-29 NOTE — Progress Notes (Signed)
 The test results show that your current treatment is OK, as the tests are stable.  Please continue the same plan.  There is no other need for change of treatment or further evaluation based on these results, at this time.  thanks

## 2024-04-29 NOTE — Patient Instructions (Addendum)
 Please consider the Shingrx, Prevnar 20 and Tdap tetanus shot and Flu shot at the pharmacy as you mentioned  Please continue all other medications as before,  Please have the pharmacy call with any other refills you may need.  Please continue your efforts at being more active, low cholesterol diet, and weight control.  You are otherwise up to date with prevention measures today.  Please keep your appointments with your specialists as you may have planned  You will be contacted regarding the referral for: Pulmonary for sleep apnea  Please go to the LAB at the blood drawing area for the tests to be done  You will be contacted by phone if any changes need to be made immediately.  Otherwise, you will receive a letter about your results with an explanation, but please check with MyChart first.  Please make an Appointment to return in 6 months, or sooner if needed

## 2024-04-29 NOTE — Progress Notes (Unsigned)
 Patient ID: Martin Archer, male   DOB: [birth year], 63 y.o.   MRN: [id number]         Chief Complaint:: wellness exam and ckd3a, dm, htn, hld, low vit d       HPI:  Martin Archer is a 63 y.o. male here for wellness exam; declines all vax today, o/w up to date                        Also Pt denies chest pain, increased sob or doe, wheezing, orthopnea, PND, increased LE swelling, palpitations, dizziness or syncope.   Pt denies polydipsia, polyuria, or new focal neuro s/s.   , Pt denies fever, wt loss, night sweats, loss of appetite, or other constitutional symptoms  Has lost several lbs now about 20 lbs with mounjaro .  To see cardiology soon, needs pulm referral to f/u [facility].  Wt Readings from Last 3 Encounters:  04/29/24 184 lb 6.4 oz (83.6 kg)  02/25/24 180 lb (81.6 kg)  09/24/23 185 lb (83.9 kg)   BP Readings from Last 3 Encounters:  04/29/24 108/72  09/24/23 92/68  09/16/23 120/82   Immunization History  Administered Date(s) Administered   Influenza Whole 06/28/2010   Influenza,inj,Quad PF,6+ Mos 07/25/2017, 05/20/2019, 05/29/2021   Influenza-Unspecified 08/06/2022   Moderna Sars-Covid-2 Vaccination 12/25/2019, 01/22/2020, 06/20/2020   Pneumococcal Polysaccharide-23 08/01/2017   Td 08/27/2001, 10/02/2013   Health Maintenance Due  Topic Date Due   Zoster Vaccines- Shingrix (1 of 2) Never done   Pneumococcal Vaccine: 50+ Years (2 of 2 - PCV) 08/01/2018   DTaP/Tdap/Td (3 - Tdap) 10/03/2023   Influenza Vaccine  03/27/2024      Past Medical History:  Diagnosis Date   Anemia, unspecified    Arthritis    Diverticulosis of colon (without mention of hemorrhage)    Gastric ulcer with hemorrhage and obstruction 2011   Dr Jakie Finn [facility]   GERD (gastroesophageal reflux disease)    hx   Hyperlipemia    Hypertension    Personal history of colonic polyps 01/30/2010   hyperplastic rectal   Sleep apnea    on CPAP; [facility]   Stroke [facility])    mini  strokes-bilateral carotid endarterectomy   Substance abuse (HCC)    History of alcohol  abuse   Past Surgical History:  Procedure Laterality Date   AORTIC ARCH ANGIOGRAPHY N/A 04/26/2021   Procedure: AORTIC ARCH ANGIOGRAPHY;  Surgeon: Darron Deatrice LABOR, MD;  Location: [facility] INVASIVE CV LAB;  Service: Cardiovascular;  Laterality: N/A;   CARDIAC CATHETERIZATION  01/25/2021   CAROTID ENDARTERECTOMY  11/2006   left Dr JINNY JONETTA Collum   COLONOSCOPY  2011   DP-positive FOBT/IDA   ENDARTERECTOMY Right 01/19/2015   Procedure: RIGHT CAROTID ENDARTERECTOMY WITH PATCH ANGIOPLASTY;  Surgeon: Lynwood JONETTA Collum, MD;  Location: [facility] OR;  Service: Vascular;  Laterality: Right;   ENDARTERECTOMY Right 06/27/2021   Procedure: RIGHT REDO CAROTID ARTERY ENDARTERECTOMY & PLACEMENT OF 15Fr DRAIN;  Surgeon: Eliza Lonni RAMAN, MD;  Location: [facility] OR;  Service: Vascular;  Laterality: Right;   ESOPHAGOGASTRODUODENOSCOPY Left 06/17/2013   Procedure: ESOPHAGOGASTRODUODENOSCOPY (EGD);  Surgeon: Elsie Cree, MD;  Location: [facility] ENDOSCOPY;  Service: Endoscopy;  Laterality: Left;   ESOPHAGOGASTRODUODENOSCOPY N/A 09/20/2015   Procedure: ESOPHAGOGASTRODUODENOSCOPY (EGD);  Surgeon: Gwendlyn ONEIDA Buddy, MD;  Location: [facility] ENDOSCOPY;  Service: Endoscopy;  Laterality: N/A;   FOOT SURGERY Bilateral 2020   Taylor's foot   INGUINAL HERNIA REPAIR  1963   NASAL  FRACTURE SURGERY  1974   PATCH ANGIOPLASTY Right 06/27/2021   Procedure: PATCH ANGIOPLASTY USING GEORGE BIOLOGIC PATCH;  Surgeon: Eliza Lonni RAMAN, MD;  Location: [facility] OR;  Service: Vascular;  Laterality: Right;   PERIPHERAL VASCULAR INTERVENTION  04/26/2021   Procedure: PERIPHERAL VASCULAR INTERVENTION;  Surgeon: Darron Deatrice LABOR, MD;  Location: [facility] INVASIVE CV LAB;  Service: Cardiovascular;;   RIGHT/LEFT HEART CATH AND CORONARY ANGIOGRAPHY N/A 01/25/2021   Procedure: RIGHT/LEFT HEART CATH AND CORONARY ANGIOGRAPHY;  Surgeon: Dann Candyce RAMAN, MD;  Location: [facility] INVASIVE CV LAB;   Service: Cardiovascular;  Laterality: N/A;   TEE WITHOUT CARDIOVERSION N/A 01/17/2015   Procedure: TRANSESOPHAGEAL ECHOCARDIOGRAM (TEE);  Surgeon: Jerel Balding, MD;  Location: [facility] ENDOSCOPY;  Service: Cardiovascular;  Laterality: N/A;   WISDOM TOOTH EXTRACTION      reports that he quit smoking about 8 years ago. His smoking use included cigarettes. He has never used smokeless tobacco. He reports that he does not currently use alcohol . He reports that he does not use drugs. family history includes Cancer in his maternal grandfather; Diabetes in his father and paternal grandfather; Heart attack in his mother; Heart attack (age of onset: 47) in his brother; Heart attack (age of onset: 32) in his maternal grandfather; Hypertension in his father; Stroke in his father and paternal uncle. Allergies  Allergen Reactions   Adderall [Amphetamine-Dextroamphet Er] Diarrhea   Prozac  [Fluoxetine  Hcl] Diarrhea   Sertraline Hcl Diarrhea   Advil [Ibuprofen] Diarrhea   Current Outpatient Medications on File Prior to Visit  Medication Sig Dispense Refill   acetaminophen  (TYLENOL ) 500 MG tablet Take 500 mg by mouth every 6 (six) hours as needed.     amLODipine  (NORVASC ) 5 MG tablet Take 1 tablet (5 mg total) by mouth daily. 90 tablet 4   aspirin  EC 81 MG tablet Take 1 tablet (81 mg total) by mouth daily. Swallow whole. 30 tablet 11   atorvastatin  (LIPITOR ) 80 MG tablet TAKE 1 TABLET BY MOUTH DAILY 90 tablet 3   Carboxymethylcellul-Glycerin (LUBRICATING EYE DROPS OP) Place 1 drop into both eyes daily as needed (dry eyes).     carvedilol  (COREG ) 25 MG tablet TAKE 1 TABLET(25 MG) BY MOUTH TWICE DAILY 180 tablet 3   clopidogrel  (PLAVIX ) 75 MG tablet TAKE 1 TABLET BY MOUTH DAILY WITH BREAKFAST 90 tablet 1   doxylamine , Sleep, (UNISOM ) 25 MG tablet Take 25 mg by mouth at bedtime as needed for sleep.     Dulaglutide  (TRULICITY ) 0.75 MG/0.5ML SOPN Inject 0.75 mg into the skin once a week. 6 mL 3   ezetimibe  (ZETIA ) 10  MG tablet TAKE 1 TABLET(10 MG) BY MOUTH DAILY 90 tablet 3   furosemide  (LASIX ) 20 MG tablet TAKE 1 TABLET(20 MG) BY MOUTH DAILY AS NEEDED FOR LOWER EXTREMITY SWELLING 90 tablet 2   gabapentin  (NEURONTIN ) 300 MG capsule TAKE 1 CAPSULE(300 MG) BY MOUTH THREE TIMES DAILY AS NEEDED 90 capsule 3   JARDIANCE  10 MG TABS tablet TAKE 1 TABLET(10 MG) BY MOUTH DAILY BEFORE BREAKFAST 90 tablet 3   MOUNJARO  5 MG/0.5ML Pen ADMINISTER 5 MG UNDER THE SKIN 1 TIME A WEEK 6 mL 11   Multiple Vitamin (MULTIVITAMIN WITH MINERALS) TABS tablet Take 1 tablet by mouth daily.     sacubitril -valsartan  (ENTRESTO ) 97-103 MG TAKE 1 TABLET BY MOUTH TWICE DAILY 180 tablet 2   sildenafil  (VIAGRA ) 100 MG tablet Take 0.5-1 tablets (50-100 mg total) by mouth daily as needed for erectile dysfunction. 5 tablet 11   triamcinolone cream (  KENALOG) 0.1 % Apply topically 2 (two) times daily as needed.     LOKELMA 10 g PACK packet Take by mouth.     No current facility-administered medications on file prior to visit.        ROS:  All others reviewed and negative.  Objective        PE:  BP 108/72 (BP Location: Right Arm, Patient Position: Sitting, Cuff Size: Normal)   Pulse 95   Temp 97.8 F (36.6 C) (Oral)   Ht 5' 8 (1.727 m)   Wt 184 lb 6.4 oz (83.6 kg)   SpO2 97%   BMI 28.04 kg/m                 Constitutional: Pt appears in NAD               HENT: Head: NCAT.                Right Ear: External ear normal.                 Left Ear: External ear normal.                Eyes: . Pupils are equal, round, and reactive to light. Conjunctivae and EOM are normal               Nose: without d/c or deformity               Neck: Neck supple. Gross normal ROM               Cardiovascular: Normal rate and regular rhythm.                 Pulmonary/Chest: Effort normal and breath sounds without rales or wheezing.                Abd:  Soft, NT, ND, + BS, no organomegaly               Neurological: Pt is alert. At baseline orientation,  motor grossly intact               Skin: Skin is warm. No rashes, no other new lesions, LE edema - none               Psychiatric: Pt behavior is normal without agitation   Micro: none  Cardiac tracings I have personally interpreted today:  none  Pertinent Radiological findings (summarize): none   Lab Results  Component Value Date   WBC 10.9 (H) 04/29/2024   HGB 13.8 04/29/2024   HCT 41.1 04/29/2024   PLT 274.0 04/29/2024   GLUCOSE 78 04/29/2024   CHOL 110 04/29/2024   TRIG 99.0 04/29/2024   HDL 41.00 04/29/2024   LDLDIRECT 48.0 03/28/2022   LDLCALC 50 04/29/2024   ALT 16 04/29/2024   AST 21 04/29/2024   NA 138 04/29/2024   K 5.1 Hemolysis seen... 04/29/2024   CL 102 04/29/2024   CREATININE 1.85 (H) 04/29/2024   BUN 35 (H) 04/29/2024   CO2 27 04/29/2024   TSH 1.62 04/29/2024   PSA 2.01 04/29/2024   INR 1.0 06/23/2021   HGBA1C 6.0 04/29/2024   MICROALBUR 1.6 04/29/2024   Assessment/Plan:  Martin Archer is a 63 y.o. White or Caucasian [1] male with  has a past medical history of Anemia, unspecified, Arthritis, Diverticulosis of colon (without mention of hemorrhage), Gastric ulcer with hemorrhage and obstruction (2011), GERD (gastroesophageal reflux disease), Hyperlipemia, Hypertension, Personal history of  colonic polyps (01/30/2010), Sleep apnea, Stroke [facility]), and Substance abuse (HCC).  Encounter for well adult exam with abnormal findings Age and sex appropriate education and counseling updated with regular exercise and diet Referrals for preventative services - none needed Immunizations addressed - declines all today Smoking counseling  - none needed Evidence for depression or other mood disorder - none significant Most recent labs reviewed. I have personally reviewed and have noted: 1) the patient's medical and social history 2) The patient's current medications and supplements 3) The patient's height, weight, and BMI have been recorded in the chart   CKD  (chronic kidney disease) stage 4, GFR 15-29 ml/min (HCC) Lab Results  Component Value Date   CREATININE 1.85 (H) 04/29/2024   Stable overall, cont to avoid nephrotoxins   Diabetes (HCC) Lab Results  Component Value Date   HGBA1C 6.0 04/29/2024   Stable, pt to continue current medical treatment, jardiance  10 every day, mounjaro  5 mg weekly   Essential hypertension BP Readings from Last 3 Encounters:  04/29/24 108/72  09/24/23 92/68  09/16/23 120/82   Stable, pt to continue medical treatment norvasc  5 every day, coreg  25 bid   HLD (hyperlipidemia) Lab Results  Component Value Date   LDLCALC 50 04/29/2024   Stable, pt to continue current statin lipitor  80 mg every day, zetia  10 qd   Sleep apnea Due for f/u - refer pulmonary  Vitamin D  deficiency Last vitamin D  Lab Results  Component Value Date   VD25OH 51.04 04/29/2024   Stable, cont oral replacement  Followup: Return in about 6 months (around 10/27/2024).  Lynwood Rush, MD 05/01/2024 8:15 PM [facility]

## 2024-05-01 ENCOUNTER — Encounter: Payer: Self-pay | Admitting: Internal Medicine

## 2024-05-01 NOTE — Assessment & Plan Note (Signed)
 Age and sex appropriate education and counseling updated with regular exercise and diet Referrals for preventative services - none needed Immunizations addressed - declines all today Smoking counseling  - none needed Evidence for depression or other mood disorder - none significant Most recent labs reviewed. I have personally reviewed and have noted: 1) the patient's medical and social history 2) The patient's current medications and supplements 3) The patient's height, weight, and BMI have been recorded in the chart

## 2024-05-01 NOTE — Assessment & Plan Note (Signed)
 Last vitamin D  Lab Results  Component Value Date   VD25OH 51.04 04/29/2024   Stable, cont oral replacement

## 2024-05-01 NOTE — Assessment & Plan Note (Signed)
 Lab Results  Component Value Date   HGBA1C 6.0 04/29/2024   Stable, pt to continue current medical treatment, jardiance  10 every day, mounjaro  5 mg weekly

## 2024-05-01 NOTE — Assessment & Plan Note (Signed)
 Due for f/u - refer pulmonary

## 2024-05-01 NOTE — Assessment & Plan Note (Signed)
 Lab Results  Component Value Date   CREATININE 1.85 (H) 04/29/2024   Stable overall, cont to avoid nephrotoxins

## 2024-05-01 NOTE — Assessment & Plan Note (Signed)
 Lab Results  Component Value Date   LDLCALC 50 04/29/2024   Stable, pt to continue current statin lipitor  80 mg every day, zetia  10 qd

## 2024-05-01 NOTE — Assessment & Plan Note (Signed)
 BP Readings from Last 3 Encounters:  04/29/24 108/72  09/24/23 92/68  09/16/23 120/82   Stable, pt to continue medical treatment norvasc  5 every day, coreg  25 bid

## 2024-05-11 NOTE — Progress Notes (Signed)
 Cardiology Office Note:    Date:  05/18/2024   ID:  Martin Archer, DOB 15-May-1961, MRN [id number]  PCP:  Norleen Agent ORN, MD    [facility] Providers Cardiologist:  Candyce Reek, MD     Referring MD: Norleen Agent ORN, MD   Chief Complaint  Patient presents with   Coronary Artery Disease    History of Present Illness:    Martin Archer is a 63 y.o. male seen for follow up CAD and CHF. He is a former patient of Dr Reek. Also followed by Dr Darron for PAD. He has  a PMH of HTN, tobacco use, HLD, carotid artery disease s/p bilateral endarterectomy with redo 2022, chronic systolic HF- EF 40-45%, TIAs, anemia, CKD, GI bleed, PAD s/p VBX stent placed to the left subclavian artery, CAD s/p [facility] 2022 with CTO of RCA and mild nonobstructive disease in remaining arteries.   He is a recovering alcoholic - quit 2018. He still smokes cigars. Unable to quit with Chantix.   Echo earlier this year showed improvement in EF to normal. Mild AS, mild MR.   He currently denies any chest pain, dyspnea, palpitations. He completed PT for his back which includes some walking. He is followed by Nephrology for CKD and pulmonary for OSA. 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)  Past Medical History:  Diagnosis Date   Anemia, unspecified    Arthritis    Diverticulosis of colon (without mention of hemorrhage)    Gastric ulcer with hemorrhage and obstruction 2011   Dr Jakie Finn GI   GERD (gastroesophageal reflux disease)    hx   Hyperlipemia    Hypertension    Personal history of colonic polyps 01/30/2010   hyperplastic rectal   Sleep apnea    on CPAP; [facility]   Stroke [facility])    mini strokes-bilateral carotid endarterectomy   Substance abuse (HCC)    History of alcohol  abuse    Past Surgical History:  Procedure Laterality Date   AORTIC ARCH ANGIOGRAPHY N/A 04/26/2021   Procedure: AORTIC ARCH ANGIOGRAPHY;  Surgeon: Darron Deatrice LABOR, MD;  [facility] INVASIVE CV LAB;  Service: Cardiovascular;  Laterality: N/A;   CARDIAC CATHETERIZATION  01/25/2021   CAROTID ENDARTERECTOMY  11/2006   left Dr JINNY JONETTA Collum   COLONOSCOPY  2011   DP-positive FOBT/IDA   ENDARTERECTOMY Right 01/19/2015   Procedure: RIGHT CAROTID ENDARTERECTOMY WITH PATCH ANGIOPLASTY;  Surgeon: Agent JONETTA Collum, MD;  Location: [facility] OR;  Service: Vascular;  Laterality: Right;   ENDARTERECTOMY Right 06/27/2021   Procedure: RIGHT REDO CAROTID ARTERY ENDARTERECTOMY & PLACEMENT OF 15Fr DRAIN;  Surgeon: Eliza Lonni RAMAN, MD;  Location: [facility] OR;  Service: Vascular;  Laterality: Right;   ESOPHAGOGASTRODUODENOSCOPY Left 06/17/2013   Procedure: ESOPHAGOGASTRODUODENOSCOPY (EGD);  Surgeon: Elsie Cree, MD;  Location: [facility] ENDOSCOPY;  Service: Endoscopy;  Laterality: Left;   ESOPHAGOGASTRODUODENOSCOPY N/A 09/20/2015   Procedure: ESOPHAGOGASTRODUODENOSCOPY (EGD);  Surgeon: Gwendlyn ONEIDA Buddy, MD;  Location: [facility] ENDOSCOPY;  Service: Endoscopy;  Laterality: N/A;   FOOT SURGERY Bilateral 2020   Taylor's foot   INGUINAL HERNIA REPAIR  1963   NASAL FRACTURE SURGERY  1974   PATCH ANGIOPLASTY Right 06/27/2021   Procedure: PATCH ANGIOPLASTY USING GEORGE BIOLOGIC PATCH;  Surgeon: Eliza Lonni RAMAN, MD;  Location: [facility] OR;  Service: Vascular;  Laterality: Right;   PERIPHERAL VASCULAR INTERVENTION  04/26/2021   Procedure: PERIPHERAL VASCULAR INTERVENTION;  Surgeon: Darron Deatrice LABOR, MD;  [facility] INVAS[facility] CV  LAB;  Service: Cardiovascular;;   RIGHT/LEFT HEART CATH AND CORONARY ANGIOGRAPHY N/A 01/25/2021   Procedure: RIGHT/LEFT HEART CATH AND CORONARY ANGIOGRAPHY;  Surgeon: Dann Candyce RAMAN, MD;  Location: [facility] INVASIVE CV LAB;  Service: Cardiovascular;  Laterality: N/A;   TEE WITHOUT CARDIOVERSION N/A 01/17/2015   Procedure: TRANSESOPHAGEAL ECHOCARDIOGRAM (TEE);  Surgeon: Jerel Balding, MD;  Location: [facility] ENDOSCOPY;  Service: Cardiovascular;  Laterality: N/A;   WISDOM  TOOTH EXTRACTION      Current Medications: Current Meds  Medication Sig   acetaminophen  (TYLENOL ) 500 MG tablet Take 500 mg by mouth every 6 (six) hours as needed.   aspirin  EC 81 MG tablet Take 1 tablet (81 mg total) by mouth daily. Swallow whole.   atorvastatin  (LIPITOR ) 80 MG tablet TAKE 1 TABLET BY MOUTH DAILY   Carboxymethylcellul-Glycerin (LUBRICATING EYE DROPS OP) Place 1 drop into both eyes daily as needed (dry eyes).   carvedilol  (COREG ) 25 MG tablet TAKE 1 TABLET(25 MG) BY MOUTH TWICE DAILY   clopidogrel  (PLAVIX ) 75 MG tablet TAKE 1 TABLET BY MOUTH DAILY WITH BREAKFAST   doxylamine , Sleep, (UNISOM ) 25 MG tablet Take 25 mg by mouth at bedtime as needed for sleep.   Dulaglutide  (TRULICITY ) 0.75 MG/0.5ML SOPN Inject 0.75 mg into the skin once a week.   ezetimibe  (ZETIA ) 10 MG tablet TAKE 1 TABLET(10 MG) BY MOUTH DAILY   furosemide  (LASIX ) 20 MG tablet TAKE 1 TABLET(20 MG) BY MOUTH DAILY AS NEEDED FOR LOWER EXTREMITY SWELLING   gabapentin  (NEURONTIN ) 300 MG capsule Take 1 capsule (300 mg total) by mouth 3 (three) times daily as needed.   JARDIANCE  10 MG TABS tablet TAKE 1 TABLET(10 MG) BY MOUTH DAILY BEFORE BREAKFAST   LOKELMA 10 g PACK packet Take by mouth.   MOUNJARO  5 MG/0.5ML Pen ADMINISTER 5 MG UNDER THE SKIN 1 TIME A WEEK   Multiple Vitamin (MULTIVITAMIN WITH MINERALS) TABS tablet Take 1 tablet by mouth daily.   sacubitril -valsartan  (ENTRESTO ) 97-103 MG TAKE 1 TABLET BY MOUTH TWICE DAILY   [DISCONTINUED] amLODipine  (NORVASC ) 10 MG tablet Take 10 mg by mouth daily.     Allergies:   Adderall [amphetamine-dextroamphet er], Prozac  [fluoxetine  hcl], Sertraline hcl, and Advil [ibuprofen]   Social History   Socioeconomic History   Marital status: Divorced    Spouse name: Not on file   Number of children: 2   Years of education: Not on file   Highest education level: Associate degree: academic program  Occupational History   Occupation: self employeed Investment banker, corporate: [employer]   Tobacco Use   Smoking status: Former    Current packs/day: 0.00    Types: Cigarettes    Quit date: 06/22/2015    Years since quitting: 8.9   Smokeless tobacco: Never   Tobacco comments:    smoked 1976-2007 & 2009- present , up to 2 ppd, average > 1ppd  Vaping Use   Vaping status: Never Used  Substance and Sexual Activity   Alcohol  use: Not Currently    Alcohol /week: 0.0 standard drinks of alcohol    Drug use: No   Sexual activity: Yes  Other Topics Concern   Not on file  Social History Narrative   Lives alone.  Daughter lives in area.    Social Drivers of Corporate investment banker Strain: Low Risk  (02/25/2024)   Overall Financial Resource Strain (CARDIA)    Difficulty of Paying Living Expenses: Not hard at all  Food Insecurity: No Food Insecurity (02/25/2024)  Hunger Vital Sign    Worried About Running Out of Food in the Last Year: Never true    Ran Out of Food in the Last Year: Never true  Transportation Needs: No Transportation Needs (02/25/2024)   PRAPARE - Administrator, Civil Service (Medical): No    Lack of Transportation (Non-Medical): No  Physical Activity: Insufficiently Active (02/25/2024)   Exercise Vital Sign    Days of Exercise per Week: 3 days    Minutes of Exercise per Session: 30 min  Stress: No Stress Concern Present (02/25/2024)   [employer] of Occupational Health - Occupational Stress Questionnaire    Feeling of Stress: Not at all  Social Connections: Moderately Integrated (02/25/2024)   Social Connection and Isolation Panel    Frequency of Communication with Friends and Family: More than three times a week    Frequency of Social Gatherings with Friends and Family: Once a week    Attends Religious Services: More than 4 times per year    Active Member of [employer] or Organizations: Yes    Attends Engineer, structural: More than 4 times per year    Marital Status: Divorced     Family History: The patient's family history  includes Cancer in his maternal grandfather; Diabetes in his father and paternal grandfather; Heart attack in his mother; Heart attack (age of onset: 59) in his brother; Heart attack (age of onset: 84) in his maternal grandfather; Hypertension in his father; Stroke in his father and paternal uncle. There is no history of Colon cancer, Esophageal cancer, Stomach cancer, Colon polyps, or Rectal cancer.  ROS:   Please see the history of present illness.     All other systems reviewed and are negative.  EKGs/Labs/Other Studies Reviewed:    The following studies were reviewed today:  Echo 12/20/20: IMPRESSIONS     1. Left ventricular ejection fraction, by estimation, is 40 to 45%. The  left ventricle has mildly decreased function. The left ventricle  demonstrates global hypokinesis. The left ventricular internal cavity size  was moderately dilated. Left ventricular  diastolic parameters are consistent with Grade III diastolic dysfunction  (restrictive). Elevated left ventricular end-diastolic pressure.   2. Right ventricular systolic function is normal. The right ventricular  size is normal. There is mildly elevated pulmonary artery systolic  pressure. The estimated right ventricular systolic pressure is 40.2 mmHg.   3. Left atrial size was severely dilated.   4. Right atrial size was severely dilated.   5. The mitral valve is normal in structure. Mild mitral valve  regurgitation. No evidence of mitral stenosis.   6. The aortic valve has an indeterminant number of cusps. Aortic valve  regurgitation is not visualized. Mild to moderate aortic valve  sclerosis/calcification is present, without any evidence of aortic  stenosis.   7. Aortic dilatation noted. There is mild dilatation of the aortic root,  measuring 38 mm. There is mild dilatation of the ascending aorta,  measuring 43 mm.   8. The inferior vena cava is normal in size with greater than 50%  respiratory variability, suggesting  right atrial pressure of 3 mmHg.   9. Compared to prior echo, LVF has declined.   Cardiac cath 01/25/21: RIGHT/LEFT HEART CATH AND CORONARY ANGIOGRAPHY   Conclusion    Prox RCA lesion is 100% stenosed. Left to right collaterals. 1st Diag lesion is 25% stenosed. 3rd Mrg lesion is 70% stenosed. LV end diastolic pressure is normal. There is no aortic valve stenosis. Hemodynamic  findings consistent with mild pulmonary hypertension. Ao sat 94%, PA sat 67%, mean PA pressure 27 mm Hg; mean PCWP 13 mm Hg; CO 4.6 L/min; CI 2.26   LV dysfuction out of proportion to the degree of CAD.   Medical therapy for heart failure.   Diagnostic Dominance: Right        Event monitor 02/25/23: Study Highlights      Normal sinus rhythm with rare PACs and rare PVCs.   Brief runs of PACs.   No symptoms noted.   No atrial fibrillation.     Patch Wear Time:  6 days and 5 hours (2024-06-18T11:12:45-0400 to 2024-06-24T16:59:37-0400)   Patient had a min HR of 64 bpm, max HR of 207 bpm, and avg HR of 97 bpm. Predominant underlying rhythm was Sinus Rhythm. 5 Supraventricular Tachycardia runs occurred, the run with the fastest interval lasting 8 beats with a max rate of 207 bpm, the  longest lasting 8 beats with an avg rate of 140 bpm. Some episodes of Supraventricular Tachycardia may be possible Atrial Tachycardia with variable block. Isolated SVEs were rare (<1.0%), SVE Couplets were rare (<1.0%), and SVE Triplets were rare  (<1.0%). Isolated VEs were rare (<1.0%), and no VE Couplets or VE Triplets were present.   Recent Labs: 04/29/2024: ALT 16; BUN 35; Creatinine, Ser 1.85; Hemoglobin 13.8; Platelets 274.0; Potassium 5.1 Hemolysis seen...; Sodium 138; TSH 1.62  Recent Lipid Panel    Component Value Date/Time   CHOL 110 04/29/2024 1105   CHOL 117 09/16/2023 1100   CHOL 286 (H) 10/12/2013 0436   TRIG 99.0 04/29/2024 1105   TRIG 572 (H) 10/12/2013 0436   TRIG 284 (HH) 07/15/2006 1158   HDL 41.00  04/29/2024 1105   HDL 41 09/16/2023 1100   HDL 35 (L) 10/12/2013 0436   CHOLHDL 3 04/29/2024 1105   VLDL 19.8 04/29/2024 1105   LDLCALC 50 04/29/2024 1105   LDLCALC 54 09/16/2023 1100   LDLCALC  03/23/2020 1601     Comment:     . LDL cholesterol not calculated. Triglyceride levels greater than 400 mg/dL invalidate calculated LDL results. . Reference range: <100 . Desirable range <100 mg/dL for primary prevention;   <70 mg/dL for patients with CHD or diabetic patients  with > or = 2 CHD risk factors. SABRA LDL-C is now calculated using the Martin-Hopkins  calculation, which is a validated novel method providing  better accuracy than the Friedewald equation in the  estimation of LDL-C.  Gladis APPLETHWAITE et al. SANDREA. 7986;689(80): 2061-2068  (http://education.Quest[employer].com/faq/FAQ164)    LDLCALC NOT CALC 10/12/2013 0436   LDLDIRECT 48.0 03/28/2022 1629   Dated 04/29/24: cholesterol 110, triglycerides 99, HDL 41, LDL 50. A1c 6%. Creatinine 1.85. otherwise CBC, CMET and TSH normal.   Risk Assessment/Calculations:                Physical Exam:    VS:  BP 110/70 (BP Location: Left Arm, Patient Position: Sitting, Cuff Size: Normal)   Pulse 95   Ht 5' 8 (1.727 m)   Wt 187 lb 12.8 oz (85.2 kg)   SpO2 94%   BMI 28.55 kg/m     Wt Readings from Last 3 Encounters:  05/18/24 187 lb 12.8 oz (85.2 kg)  05/13/24 185 lb (83.9 kg)  04/29/24 184 lb 6.4 oz (83.6 kg)     GEN:  Well nourished, well developed in no acute distress HEENT: Normal NECK: No JVD; No carotid bruits LYMPHATICS: No lymphadenopathy CARDIAC: RRR, no murmurs, rubs, gallops  RESPIRATORY:  Clear to auscultation without rales, wheezing or rhonchi  ABDOMEN: Soft, non-tender, non-distended MUSCULOSKELETAL:  No edema; No deformity  SKIN: Warm and dry NEUROLOGIC:  Alert and oriented x 3 PSYCHIATRIC:  Normal affect   ASSESSMENT:    1. Coronary artery disease of native artery of native heart with stable angina  pectoris (HCC)   2. PAD (peripheral artery disease)   3. Carotid artery disease, unspecified laterality (HCC)   4. Essential hypertension   5. Subclavian artery stenosis, left   6. Abdominal aortic aneurysm (AAA) without rupture, unspecified part (HCC)   7. Hyperlipidemia, unspecified hyperlipidemia type     PLAN:    In order of problems listed above:  CAD: No angina on medical therapy.  Known CTO of the RCA.  Continue aggressive secondary prevention.  Chronic systolic heart failure: Appears euvolemic.  He is on good heart failure therapy with Entresto , Coreg , Jardiance .  Last creatinine on file is 1.85.  Would not add aldactone given CKD and tendency for hyperkalemia. Last Echo showed some improvement in EF to 55-60% HTN: well controlled on current therapy  Carotid artery disease/subclavian disease: s/p subclavian stent.  Known left carotid occlusion  PAD: Managed with Dr. Darron.  Hyperlipidemia: LDL 50.  Continue lipid-lowering therapy.  CRI: Cr 1.85.  Tight BP controlled.  Left kidney atrophied. Followed with Nephrology.   Avoid NSAIDs.  Stay well hydrated.  Tobacco abuse. Encourage smoking cessation Recovering alcoholic.  AAA 2.9 cm. Followed by Dr Darron           Medication Adjustments/Labs and Tests Ordered: Current medicines are reviewed at length with the patient today.  Concerns regarding medicines are outlined above.  No orders of the defined types were placed in this encounter.  No orders of the defined types were placed in this encounter.   Patient Instructions  Medication Instructions:  Continue same medications *If you need a refill on your cardiac medications before your next appointment, please call your pharmacy*  Lab Work: None ordered  Testing/Procedures: None ordered  Follow-Up: At [facility], you and your health needs are our priority.  As part of our continuing mission to provide you with exceptional heart care, our providers are all  part of one team.  This team includes your primary Cardiologist (physician) and Advanced Practice Providers or APPs (Physician Assistants and Nurse Practitioners) who all work together to provide you with the care you need, when you need it.  Your next appointment:  1 year    Call in May to schedule Sept appointment     Provider:  Dr.Ernisha Sorn    We recommend signing up for the patient portal called MyChart.  Sign up information is provided on this After Visit Summary.  MyChart is used to connect with patients for Virtual Visits (Telemedicine).  Patients are able to view lab/test results, encounter notes, upcoming appointments, etc.  Non-urgent messages can be sent to your provider as well.   To learn more about what you can do with MyChart, go to [email or web address].         Signed, Tyrin Herbers [place], MD  05/18/2024 5:02 PM    [facility]  Cardiology Office Note:    Date:  05/18/2024   ID:  Martin Archer, DOB 03-Feb-1961, MRN [id number]  PCP:  Norleen Agent ORN, MD   [facility] Providers Cardiologist:  Candyce Reek, MD     Referring MD: Norleen Agent ORN, MD   Chief Complaint  Patient presents with  Coronary Artery Disease    History of Present Illness:    Martin Archer is a 63 y.o. male seen for follow up CAD and CHF. He is a former patient of Dr Dann. Also followed by Dr Darron for PAD. He has  a PMH of HTN, tobacco use, HLD, carotid artery disease s/p bilateral endarterectomy with redo 2022, chronic systolic HF- EF 40-45%, TIAs, anemia, CKD, GI bleed, PAD s/p VBX stent placed to the left subclavian artery, CAD s/p [facility] 2022 with CTO of RCA and mild nonobstructive disease in remaining arteries.   Past Medical History:  Diagnosis Date   Anemia, unspecified    Arthritis    Diverticulosis of colon (without mention of hemorrhage)    Gastric ulcer with hemorrhage and obstruction 2011   Dr Jakie Finn GI   GERD (gastroesophageal reflux  disease)    hx   Hyperlipemia    Hypertension    Personal history of colonic polyps 01/30/2010   hyperplastic rectal   Sleep apnea    on CPAP; [facility]   Stroke [facility])    mini strokes-bilateral carotid endarterectomy   Substance abuse (HCC)    History of alcohol  abuse    Past Surgical History:  Procedure Laterality Date   AORTIC ARCH ANGIOGRAPHY N/A 04/26/2021   Procedure: AORTIC ARCH ANGIOGRAPHY;  Surgeon: Darron Deatrice LABOR, MD;  [facility] INVASIVE CV LAB;  Service: Cardiovascular;  Laterality: N/A;   CARDIAC CATHETERIZATION  01/25/2021   CAROTID ENDARTERECTOMY  11/2006   left Dr JINNY JONETTA Collum   COLONOSCOPY  2011   DP-positive FOBT/IDA   ENDARTERECTOMY Right 01/19/2015   Procedure: RIGHT CAROTID ENDARTERECTOMY WITH PATCH ANGIOPLASTY;  Surgeon: Lynwood JONETTA Collum, MD;  Location: [facility] OR;  Service: Vascular;  Laterality: Right;   ENDARTERECTOMY Right 06/27/2021   Procedure: RIGHT REDO CAROTID ARTERY ENDARTERECTOMY & PLACEMENT OF 15Fr DRAIN;  Surgeon: Eliza Lonni RAMAN, MD;  Location[facility] OR;  Service: Vascular;  Laterality: Right;   ESOPHAGOGASTRODUODENOSCOPY Left 06/17/2013   Procedure: ESOPHAGOGASTRODUODENOSCOPY (EGD);  Surgeon: Elsie Cree, MD;  Location: [facility] ENDOSCOPY;  Service: Endoscopy;  Laterality: Left;   ESOPHAGOGASTRODUODENOSCOPY N/A 09/20/2015   Procedure: ESOPHAGOGASTRODUODENOSCOPY (EGD);  Surgeon: Gwendlyn ONEIDA Buddy, MD;  Location: [facility] ENDOSCOPY;  Service: Endoscopy;  Laterality: N/A;   FOOT SURGERY Bilateral 2020   Taylor's foot   INGUINAL HERNIA REPAIR  1963   NASAL FRACTURE SURGERY  1974   PATCH ANGIOPLASTY Right 06/27/2021   Procedure: PATCH ANGIOPLASTY USING GEORGE BIOLOGIC PATCH;  Surgeon: Eliza Lonni RAMAN, MD;  Location: [facility] OR;  Service: Vascular;  Laterality: Right;   PERIPHERAL VASCULAR INTERVENTION  04/26/2021   Procedure: PERIPHERAL VASCULAR INTERVENTION;  Surgeon: Darron Deatrice LABOR, MD;  [facility]: [facility] INVASIVE CV LAB;  Service:  Cardiovascular;;   RIGHT/LEFT HEART CATH AND CORONARY ANGIOGRAPHY N/A 01/25/2021   Procedure: RIGHT/LEFT HEART CATH AND CORONARY ANGIOGRAPHY;  Surgeon: Dann Candyce RAMAN, MD;  Location: [facility] INVASIVE CV LAB;  Service: Cardiovascular;  Laterality: N/A;   TEE WITHOUT CARDIOVERSION N/A 01/17/2015   Procedure: TRANSESOPHAGEAL ECHOCARDIOGRAM (TEE);  Surgeon: Jerel Balding, MD;  Location: [facility] ENDOSCOPY;  Service: Cardiovascular;  Laterality: N/A;   WISDOM TOOTH EXTRACTION      Current Medications: Current Meds  Medication Sig   acetaminophen  (TYLENOL ) 500 MG tablet Take 500 mg by mouth every 6 (six) hours as needed.   aspirin  EC 81 MG tablet Take 1 tablet (81 mg total) by mouth daily. Swallow whole.   atorvastatin  (LIPITOR ) 80 MG tablet TAKE 1 TABLET  BY MOUTH DAILY   Carboxymethylcellul-Glycerin (LUBRICATING EYE DROPS OP) Place 1 drop into both eyes daily as needed (dry eyes).   carvedilol  (COREG ) 25 MG tablet TAKE 1 TABLET(25 MG) BY MOUTH TWICE DAILY   clopidogrel  (PLAVIX ) 75 MG tablet TAKE 1 TABLET BY MOUTH DAILY WITH BREAKFAST   doxylamine , Sleep, (UNISOM ) 25 MG tablet Take 25 mg by mouth at bedtime as needed for sleep.   Dulaglutide  (TRULICITY ) 0.75 MG/0.5ML SOPN Inject 0.75 mg into the skin once a week.   ezetimibe  (ZETIA ) 10 MG tablet TAKE 1 TABLET(10 MG) BY MOUTH DAILY   furosemide  (LASIX ) 20 MG tablet TAKE 1 TABLET(20 MG) BY MOUTH DAILY AS NEEDED FOR LOWER EXTREMITY SWELLING   gabapentin  (NEURONTIN ) 300 MG capsule Take 1 capsule (300 mg total) by mouth 3 (three) times daily as needed.   JARDIANCE  10 MG TABS tablet TAKE 1 TABLET(10 MG) BY MOUTH DAILY BEFORE BREAKFAST   LOKELMA 10 g PACK packet Take by mouth.   MOUNJARO  5 MG/0.5ML Pen ADMINISTER 5 MG UNDER THE SKIN 1 TIME A WEEK   Multiple Vitamin (MULTIVITAMIN WITH MINERALS) TABS tablet Take 1 tablet by mouth daily.   sacubitril -valsartan  (ENTRESTO ) 97-103 MG TAKE 1 TABLET BY MOUTH TWICE DAILY   [DISCONTINUED] amLODipine  (NORVASC ) 10 MG  tablet Take 10 mg by mouth daily.     Allergies:   Adderall [amphetamine-dextroamphet er], Prozac  [fluoxetine  hcl], Sertraline hcl, and Advil [ibuprofen]   Social History   Socioeconomic History   Marital status: Divorced    Spouse name: Not on file   Number of children: 2   Years of education: Not on file   Highest education level: Associate degree: academic program  Occupational History   Occupation: self employeed Investment banker, corporate: [employer]   Tobacco Use   Smoking status: Former    Current packs/day: 0.00    Types: Cigarettes    Quit date: 06/22/2015    Years since quitting: 8.9   Smokeless tobacco: Never   Tobacco comments:    smoked 1976-2007 & 2009- present , up to 2 ppd, average > 1ppd  Vaping Use   Vaping status: Never Used  Substance and Sexual Activity   Alcohol  use: Not Currently    Alcohol /week: 0.0 standard drinks of alcohol    Drug use: No   Sexual activity: Yes  Other Topics Concern   Not on file  Social History Narrative   Lives alone.  Daughter lives in area.    Social Drivers of Corporate investment banker Strain: Low Risk  (02/25/2024)   Overall Financial Resource Strain (CARDIA)    Difficulty of Paying Living Expenses: Not hard at all  Food Insecurity: No Food Insecurity (02/25/2024)   Hunger Vital Sign    Worried About Running Out of Food in the Last Year: Never true    Ran Out of Food in the Last Year: Never true  Transportation Needs: No Transportation Needs (02/25/2024)   PRAPARE - Administrator, Civil Service (Medical): No    Lack of Transportation (Non-Medical): No  Physical Activity: Insufficiently Active (02/25/2024)   Exercise Vital Sign    Days of Exercise per Week: 3 days    Minutes of Exercise per Session: 30 min  Stress: No Stress Concern Present (02/25/2024)   [employer] of Occupational Health - Occupational Stress Questionnaire    Feeling of Stress: Not at all  Social Connections: Moderately Integrated  (02/25/2024)   Social Connection and Isolation Panel  Frequency of Communication with Friends and Family: More than three times a week    Frequency of Social Gatherings with Friends and Family: Once a week    Attends Religious Services: More than 4 times per year    Active Member of [employer] or Organizations: Yes    Attends Engineer, structural: More than 4 times per year    Marital Status: Divorced     Family History: The patient's family history includes Cancer in his maternal grandfather; Diabetes in his father and paternal grandfather; Heart attack in his mother; Heart attack (age of onset: 91) in his brother; Heart attack (age of onset: 34) in his maternal grandfather; Hypertension in his father; Stroke in his father and paternal uncle. There is no history of Colon cancer, Esophageal cancer, Stomach cancer, Colon polyps, or Rectal cancer.  ROS:   Please see the history of present illness.     All other systems reviewed and are negative.  EKGs/Labs/Other Studies Reviewed:    The following studies were reviewed today:  Echo 12/20/20: IMPRESSIONS     1. Left ventricular ejection fraction, by estimation, is 40 to 45%. The  left ventricle has mildly decreased function. The left ventricle  demonstrates global hypokinesis. The left ventricular internal cavity size  was moderately dilated. Left ventricular  diastolic parameters are consistent with Grade III diastolic dysfunction  (restrictive). Elevated left ventricular end-diastolic pressure.   2. Right ventricular systolic function is normal. The right ventricular  size is normal. There is mildly elevated pulmonary artery systolic  pressure. The estimated right ventricular systolic pressure is 40.2 mmHg.   3. Left atrial size was severely dilated.   4. Right atrial size was severely dilated.   5. The mitral valve is normal in structure. Mild mitral valve  regurgitation. No evidence of mitral stenosis.   6. The aortic valve  has an indeterminant number of cusps. Aortic valve  regurgitation is not visualized. Mild to moderate aortic valve  sclerosis/calcification is present, without any evidence of aortic  stenosis.   7. Aortic dilatation noted. There is mild dilatation of the aortic root,  measuring 38 mm. There is mild dilatation of the ascending aorta,  measuring 43 mm.   8. The inferior vena cava is normal in size with greater than 50%  respiratory variability, suggesting right atrial pressure of 3 mmHg.   9. Compared to prior echo, LVF has declined.   Cardiac cath 01/25/21: RIGHT/LEFT HEART CATH AND CORONARY ANGIOGRAPHY   Conclusion    Prox RCA lesion is 100% stenosed. Left to right collaterals. 1st Diag lesion is 25% stenosed. 3rd Mrg lesion is 70% stenosed. LV end diastolic pressure is normal. There is no aortic valve stenosis. Hemodynamic findings consistent with mild pulmonary hypertension. Ao sat 94%, PA sat 67%, mean PA pressure 27 mm Hg; mean PCWP 13 mm Hg; CO 4.6 L/min; CI 2.26   LV dysfuction out of proportion to the degree of CAD.   Medical therapy for heart failure.   Diagnostic Dominance: Right        Event monitor 02/25/23: Study Highlights      Normal sinus rhythm with rare PACs and rare PVCs.   Brief runs of PACs.   No symptoms noted.   No atrial fibrillation.     Patch Wear Time:  6 days and 5 hours (2024-06-18T11:12:45-0400 to 2024-06-24T16:59:37-0400)   Patient had a min HR of 64 bpm, max HR of 207 bpm, and avg HR of 97 bpm. Predominant underlying rhythm  was Sinus Rhythm. 5 Supraventricular Tachycardia runs occurred, the run with the fastest interval lasting 8 beats with a max rate of 207 bpm, the  longest lasting 8 beats with an avg rate of 140 bpm. Some episodes of Supraventricular Tachycardia may be possible Atrial Tachycardia with variable block. Isolated SVEs were rare (<1.0%), SVE Couplets were rare (<1.0%), and SVE Triplets were rare  (<1.0%). Isolated VEs were rare  (<1.0%), and no VE Couplets or VE Triplets were present.  Echo 10/21/23: IMPRESSIONS     1. Left ventricular ejection fraction, by estimation, is 58%. The left  ventricle has normal function. The left ventricle has no regional wall  motion abnormalities. There is mild left ventricular hypertrophy. Left  ventricular diastolic parameters are  consistent with Grade I diastolic dysfunction (impaired relaxation).   2. Right ventricular systolic function is normal. The right ventricular  size is normal. There is normal pulmonary artery systolic pressure.   3. Left atrial size was moderately dilated.   4. Right atrial size was mildly dilated.   5. The mitral valve is abnormal. Mild mitral valve regurgitation. No  evidence of mitral stenosis.   6. The aortic valve is tricuspid. There is moderate calcification of the  aortic valve. There is moderate thickening of the aortic valve. Aortic  valve regurgitation is not visualized. Mild aortic valve stenosis.   7. Aortic dilatation noted. There is moderate dilatation of the ascending  aorta, measuring 41 mm.   8. The inferior vena cava is normal in size with greater than 50%  respiratory variability, suggesting right atrial pressure of 3 mmHg.     Recent Labs: 04/29/2024: ALT 16; BUN 35; Creatinine, Ser 1.85; Hemoglobin 13.8; Platelets 274.0; Potassium 5.1 Hemolysis seen...; Sodium 138; TSH 1.62  Recent Lipid Panel    Component Value Date/Time   CHOL 110 04/29/2024 1105   CHOL 117 09/16/2023 1100   CHOL 286 (H) 10/12/2013 0436   TRIG 99.0 04/29/2024 1105   TRIG 572 (H) 10/12/2013 0436   TRIG 284 (HH) 07/15/2006 1158   HDL 41.00 04/29/2024 1105   HDL 41 09/16/2023 1100   HDL 35 (L) 10/12/2013 0436   CHOLHDL 3 04/29/2024 1105   VLDL 19.8 04/29/2024 1105   LDLCALC 50 04/29/2024 1105   LDLCALC 54 09/16/2023 1100   LDLCALC  03/23/2020 1601     Comment:     . LDL cholesterol not calculated. Triglyceride levels greater than 400 mg/dL  invalidate calculated LDL results. . Reference range: <100 . Desirable range <100 mg/dL for primary prevention;   <70 mg/dL for patients with CHD or diabetic patients  with > or = 2 CHD risk factors. SABRA LDL-C is now calculated using the Martin-Hopkins  calculation, which is a validated novel method providing  better accuracy than the Friedewald equation in the  estimation of LDL-C.  Gladis APPLETHWAITE et al. SANDREA. 7986;689(80): 2061-2068  (http://education.[employer].com/faq/FAQ164)    LDLCALC NOT CALC 10/12/2013 0436   LDLDIRECT 48.0 03/28/2022 1629   Dated 08/08/23: normal CMET and CBC  Risk Assessment/Calculations:                Physical Exam:    VS:  BP 110/70 (BP Location: Left Arm, Patient Position: Sitting, Cuff Size: Normal)   Pulse 95   Ht 5' 8 (1.727 m)   Wt 187 lb 12.8 oz (85.2 kg)   SpO2 94%   BMI 28.55 kg/m     Wt Readings from Last 3 Encounters:  05/18/24 187 lb 12.8 oz (  85.2 kg)  05/13/24 185 lb (83.9 kg)  04/29/24 184 lb 6.4 oz (83.6 kg)     GEN:  Well nourished, well developed in no acute distress HEENT: Normal NECK: No JVD; No carotid bruits LYMPHATICS: No lymphadenopathy CARDIAC: RRR, no murmurs, rubs, gallops RESPIRATORY:  Clear to auscultation without rales, wheezing or rhonchi  ABDOMEN: Soft, non-tender, non-distended MUSCULOSKELETAL:  No edema; No deformity  SKIN: Warm and dry NEUROLOGIC:  Alert and oriented x 3 PSYCHIATRIC:  Normal affect   ASSESSMENT:    1. Coronary artery disease of native artery of native heart with stable angina pectoris (HCC)   2. PAD (peripheral artery disease)   3. Carotid artery disease, unspecified laterality (HCC)   4. Essential hypertension   5. Subclavian artery stenosis, left   6. Abdominal aortic aneurysm (AAA) without rupture, unspecified part (HCC)   7. Hyperlipidemia, unspecified hyperlipidemia type     PLAN:    In order of problems listed above:  CAD: No angina on medical therapy.  Known CTO  of the RCA.  Continue aggressive secondary prevention.  Chronic systolic heart failure: Appears euvolemic.  He is on aggressive heart failure therapy.  Last creatinine on file is 2.2.  Would not add any further heart failure medicines such as spironolactone that could affect renal function. HTN: Amlodipine  increased recently by renal per his report a few weeks ago.  Has not had morning meds yet this morning.  Check blood pressure readings at home after relaxing for at least 10 minutes.  Let us  know via MyChart regarding home blood pressure readings.   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   PAD: Managed with Dr. Darron.  Hyperlipidemia: LDL 58.  Continue lipid-lowering therapy. CRI: Cr 2.23.  Tight BP controlled.  Left kidney atrophied. Followed with renal.  Avoid NSAIDs.  Stay well hydrated.            Medication Adjustments/Labs and Tests Ordered: Current medicines are reviewed at length with the patient today.  Concerns regarding medicines are outlined above.  No orders of the defined types were placed in this encounter.  No orders of the defined types were placed in this encounter.   Patient Instructions  Medication Instructions:  Continue same medications *If you need a refill on your cardiac medications before your next appointment, please call your pharmacy*  Lab Work: None ordered  Testing/Procedures: None ordered  Follow-Up: At [facility], you and your health needs are our priority.  As part of our continuing mission to provide you with exceptional heart care, our providers are all part of one team.  This team includes your primary Cardiologist (physician) and Advanced Practice Providers or APPs (Physician Assistants and Nurse Practitioners) who all work together to provide you with the care you need, when you need it.  Your next appointment:  1 year    Call in May to schedule Sept appointment     Provider:  Dr.Hagan Maltz    We  recommend signing up for the patient portal called MyChart.  Sign up information is provided on this After Visit Summary.  MyChart is used to connect with patients for Virtual Visits (Telemedicine).  Patients are able to view lab/test results, encounter notes, upcoming appointments, etc.  Non-urgent messages can be sent to your provider as well.   To learn more about what you can do with MyChart, go to [email or web address].         Signed, Aero Drummonds [place], MD  05/18/2024 5:02 PM  [facility]

## 2024-05-13 ENCOUNTER — Ambulatory Visit (INDEPENDENT_AMBULATORY_CARE_PROVIDER_SITE_OTHER): Admitting: Family Medicine

## 2024-05-13 ENCOUNTER — Encounter: Payer: Self-pay | Admitting: Family Medicine

## 2024-05-13 VITALS — BP 104/80 | HR 87 | Ht 68.0 in | Wt 185.0 lb

## 2024-05-13 DIAGNOSIS — M5441 Lumbago with sciatica, right side: Secondary | ICD-10-CM | POA: Diagnosis not present

## 2024-05-13 DIAGNOSIS — M5442 Lumbago with sciatica, left side: Secondary | ICD-10-CM

## 2024-05-13 DIAGNOSIS — G8929 Other chronic pain: Secondary | ICD-10-CM

## 2024-05-13 DIAGNOSIS — M4300 Spondylolysis, site unspecified: Secondary | ICD-10-CM | POA: Diagnosis not present

## 2024-05-13 MED ORDER — GABAPENTIN 300 MG PO CAPS: 300.0000 mg | ORAL_CAPSULE | Freq: Three times a day (TID) | ORAL | 3 refills | Status: AC | PRN

## 2024-05-13 NOTE — Patient Instructions (Addendum)
 Thank you for coming in today.   MISS Transforaminal lumbar interbody fusion (TLIF)  We've placed an order for a back injection.   Gabapentin  has been refilled.   See you back as needed.

## 2024-05-13 NOTE — Progress Notes (Unsigned)
 I, Leotis Batter, CMA acting as a scribe for Artist Lloyd, MD.  Martin Archer is a 63 y.o. male who presents to [facility] today for flare of his LBP. Pt was last seen by Dr. Lloyd on 08/30/23 and was re-referred to PT, completing 14 visits. Last lumbar ESI, 08/07/23.  Today, pt reports exacerbation of lower back sx. Sx are bilateral, radiating into B LE. Sciatic-type pain. Sx have responded well to ESI in the past. Sx worse with prolonged sitting. Does a lot of standing and walking at work, which helps short-term Significant relief, 90%, improvement with last back injection. Has been taking Oxycontin  for more severe pain, has also tried muscle relaxer's. Sx also tend to flare-up during the night. Also taking Gabapentin  as needed.   Dx imaging: 06/26/23 L-spine MRI 04/15/23 L-spine XR 05/03/20 L-spine w/ bend XR   Pertinent review of systems: No fever or chills  Relevant historical information: spondylolisthesis.  History of a DVT.   Exam:  BP 104/80   Pulse 87   Ht 5' 8 (1.727 m)   Wt 185 lb (83.9 kg)   SpO2 97%   BMI 28.13 kg/m  General: Well Developed, well nourished, and in no acute distress.   MSK: L-spine decreased lumbar motion.  Lower extremity strength is intact. Reflexes are intact.    Lab and Radiology Results  EXAM: MRI LUMBAR SPINE WITHOUT CONTRAST   TECHNIQUE: Multiplanar, multisequence MR imaging of the lumbar spine was performed. No intravenous contrast was administered.   COMPARISON:  Lumbar spine radiographs 04/15/2023. The sagittal T2 weighted images from a remote lumbar MRI 12/14/2010 are available, although the images in the other planes are not.   FINDINGS: Segmentation: Conventional anatomy assumed, with the last open disc space designated L5-S1.Concordant with prior imaging.   Alignment: Chronic grade 2 anterolisthesis at L5-S1 appears slightly progressive from remote MRI, measuring approximate 1.9 cm.    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 No evidence of acute fracture or aggressive osseous lesion. Mild sacroiliac degenerative changes bilaterally.   Conus medullaris: Extends to the T12-L1 level and appears normal.   Paraspinal and other soft tissues: No significant paraspinal findings. Severe left renal cortical scarring and atrophy.   Disc levels:   Sagittal images through the lower thoracic spine demonstrate loss of disc height with a broad-based disc protrusion at T10-11. No resulting cord deformity or significant foraminal narrowing. No significant disc space findings at T11-12 or T12-L1.   L1-2: Mild loss of disc height with mild disc bulging and mild bilateral facet hypertrophy. No spinal stenosis or nerve root encroachment.   L2-3: Preserved disc height with mild disc bulging and mild bilateral facet hypertrophy. No significant spinal stenosis or nerve root encroachment.   L3-4: Mild loss of disc height with mild disc bulging and mild to moderate facet and ligamentous hypertrophy. No significant spinal stenosis or nerve root encroachment.   L4-5: Preserved disc height with mild disc bulging. Mild to moderate bilateral facet hypertrophy. No significant spinal stenosis or nerve root encroachment.   L5-S1: As above, chronic bilateral L5 pars defects with progressive anterolisthesis and disc uncovering compared with remote MRI. There is probable interbody ankylosis at this level. There is mild central canal stenosis and mild lateral recess narrowing bilaterally. Chronic severe foraminal narrowing bilaterally with probable chronic bilateral L5 nerve root encroachment.   IMPRESSION: 1. 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 There is chronic severe foraminal narrowing bilaterally and probable chronic bilateral L5  nerve root encroachment. 2. Mild spondylosis at the other lumbar levels without significant spinal stenosis or nerve root encroachment. 3. No acute osseous findings.     Electronically Signed   By: Elsie Perone M.D.   On: 07/04/2023 10:12 I, Artist Lloyd, personally (independently) visualized and performed the interpretation of the images attached in this note.     Assessment and Plan: 63 y.o. male with left lumbar radiculopathy L5 dermatomal pattern attributable to spondylolisthesis L5-S1.  Plan for repeat epidural steroid injection and gabapentin . Additionally we did discuss surgical options and after discussion we will go ahead and refer to neurosurgery to discuss options and potentially plan for surgery in the future.  Gabapentin  refilled to use as needed PDMP not reviewed this encounter. Orders Placed This Encounter  Procedures   DG INJECT DIAG/THERA/INC NEEDLE/CATH/PLC EPI/LUMB/SAC W/IMG    Level and technique per radiology    Standing Status:   Future    Expiration Date:   06/12/2024    Reason for Exam (SYMPTOM  OR DIAGNOSIS REQUIRED):   Low back pain    Preferred Imaging Location?:   [facility]   Meds ordered this encounter  Medications   gabapentin  (NEURONTIN ) 300 MG capsule    Sig: Take 1 capsule (300 mg total) by mouth 3 (three) times daily as needed.    Dispense:  90 capsule    Refill:  3     Discussed warning signs or symptoms. Please see discharge instructions. Patient expresses understanding.   The above documentation has been reviewed and is accurate and complete Artist Lloyd, M.D.

## 2024-05-18 ENCOUNTER — Encounter: Payer: Self-pay | Admitting: Cardiology

## 2024-05-18 ENCOUNTER — Ambulatory Visit: Attending: Cardiology | Admitting: Cardiology

## 2024-05-18 VITALS — BP 110/70 | HR 95 | Ht 68.0 in | Wt 187.8 lb

## 2024-05-18 DIAGNOSIS — I10 Essential (primary) hypertension: Secondary | ICD-10-CM | POA: Diagnosis not present

## 2024-05-18 DIAGNOSIS — I779 Disorder of arteries and arterioles, unspecified: Secondary | ICD-10-CM

## 2024-05-18 DIAGNOSIS — I739 Peripheral vascular disease, unspecified: Secondary | ICD-10-CM | POA: Diagnosis not present

## 2024-05-18 DIAGNOSIS — I25118 Atherosclerotic heart disease of native coronary artery with other forms of angina pectoris: Secondary | ICD-10-CM | POA: Diagnosis not present

## 2024-05-18 DIAGNOSIS — E785 Hyperlipidemia, unspecified: Secondary | ICD-10-CM

## 2024-05-18 DIAGNOSIS — I771 Stricture of artery: Secondary | ICD-10-CM

## 2024-05-18 DIAGNOSIS — I714 Abdominal aortic aneurysm, without rupture, unspecified: Secondary | ICD-10-CM

## 2024-05-18 NOTE — Patient Instructions (Signed)
 Medication Instructions:  Continue same medications *If you need a refill on your cardiac medications before your next appointment, please call your pharmacy*  Lab Work: None ordered  Testing/Procedures: None ordered  Follow-Up: At [facility], you and your health needs are our priority.  As part of our continuing mission to provide you with exceptional heart care, our providers are all part of one team.  This team includes your primary Cardiologist (physician) and Advanced Practice Providers or APPs (Physician Assistants and Nurse Practitioners) who all work together to provide you with the care you need, when you need it.  Your next appointment:  1 year   Call in May to schedule Sept appointment      Provider:  Dr.Jordan   We recommend signing up for the patient portal called MyChart.  Sign up information is provided on this After Visit Summary.  MyChart is used to connect with patients for Virtual Visits (Telemedicine).  Patients are able to view lab/test results, encounter notes, upcoming appointments, etc.  Non-urgent messages can be sent to your provider as well.   To learn more about what you can do with MyChart, go to [email or web address].

## 2024-05-25 ENCOUNTER — Telehealth: Payer: Self-pay | Admitting: Family Medicine

## 2024-05-25 ENCOUNTER — Telehealth: Payer: Self-pay

## 2024-05-25 NOTE — Telephone Encounter (Signed)
 Patient asked if Dr Joane would be able to send in something for pain while he is waiting for the epidural injection and to see the surgeon?  Please advise.

## 2024-05-25 NOTE — Telephone Encounter (Signed)
   Pre-operative Risk Assessment    Patient Name: Martin Archer  DOB: 09/05/1960 MRN: [id number]   Date of last office visit: 05/18/24 PETER SWAZILAND, MD Date of next office visit: NONE   Request for Surgical Clearance    Procedure:  LUMBAR EPIDURAL  Date of Surgery:  Clearance TBD                                Surgeon:  NOT INDICATED Surgeon's Group or Practice Name:  [facility] Phone number:  [phone] Fax number:  [phone]   Type of Clearance Requested:   - Medical  - Pharmacy:  Hold Clopidogrel  (Plavix ) 5 DAYS BEFORE & ASPIRIN  ?   Type of Anesthesia:  Not Indicated   Additional requests/questions:    Signed, Lucie DELENA Ku   05/25/2024, 1:38 PM

## 2024-05-25 NOTE — Telephone Encounter (Signed)
   Primary Cardiologist: Candyce Reek, MD  Chart reviewed as part of pre-operative protocol coverage. Given past medical history and time since last visit, based on ACC/AHA guidelines, Martin Archer would be at acceptable risk for the planned procedure without further cardiovascular testing.   Patient should contact our office if he is having new symptoms that are concerning from a cardiac perspective to arrange a follow-up appointment.    Per office protocol, he may hold Plavix  for 5 days prior to procedure and should resume as soon as hemodynamically stable postoperatively. Ideally aspirin  should be continued without interruption, however if the bleeding risk is too great, aspirin  may be held for 5-7 days prior to surgery. Please resume aspirin  post operatively when it is felt to be safe from a bleeding standpoint.    I will route this recommendation to the requesting party via Epic fax function and remove from pre-op pool.  Please call with questions.  Rosaline EMERSON Bane, NP-C 05/25/2024, 2:13 PM [address] [place], [place] [address] Office [phone] Fax [phone]

## 2024-05-26 MED ORDER — HYDROCODONE-ACETAMINOPHEN 5-325 MG PO TABS: 1.0000 | ORAL_TABLET | Freq: Four times a day (QID) | ORAL | 0 refills | Status: AC | PRN

## 2024-05-26 NOTE — Telephone Encounter (Signed)
 Pt called again requesting pain rx to get him through till his ESI. Pharmacy on file is correct.

## 2024-05-26 NOTE — Telephone Encounter (Signed)
 Hydrocodone  sent to pharmacy.  This should help with pain.  Use with caution.

## 2024-05-26 NOTE — Addendum Note (Signed)
 Addended by: JOANE ARTIST RAMAN on: 05/26/2024 12:08 PM   Modules accepted: Orders

## 2024-05-28 ENCOUNTER — Other Ambulatory Visit: Payer: Self-pay | Admitting: Cardiology

## 2024-06-03 ENCOUNTER — Other Ambulatory Visit: Payer: Self-pay | Admitting: Family Medicine

## 2024-06-03 DIAGNOSIS — G8929 Other chronic pain: Secondary | ICD-10-CM

## 2024-06-10 ENCOUNTER — Ambulatory Visit (INDEPENDENT_AMBULATORY_CARE_PROVIDER_SITE_OTHER)

## 2024-06-10 VITALS — BP 138/80 | HR 90 | Temp 98.0°F | Ht 68.0 in | Wt 188.0 lb

## 2024-06-10 DIAGNOSIS — Z122 Encounter for screening for malignant neoplasm of respiratory organs: Secondary | ICD-10-CM

## 2024-06-10 DIAGNOSIS — G4733 Obstructive sleep apnea (adult) (pediatric): Secondary | ICD-10-CM

## 2024-06-10 DIAGNOSIS — G4734 Idiopathic sleep related nonobstructive alveolar hypoventilation: Secondary | ICD-10-CM

## 2024-06-10 NOTE — Patient Instructions (Signed)
 Notification of test results are managed in the following manner: If there are any recommendations or changes to the plan of care discussed in office today, we will contact you and let you know what they are. If you do not hear from us , then your results are normal/expected and you can view them through your MyChart account, or a letter will be sent to you. Thank you again for trusting us  with your care [facility].

## 2024-06-10 NOTE — Progress Notes (Signed)
 Referring Physician:  Joane Artist RAMAN, MD [address] [place],  [place] [address]  Primary Physician:  Norleen Lynwood ORN, MD  History of Present Illness: 06/16/2024 [name] is here today with a chief complaint of chronic sciatic pain who presents for evaluation of ongoing leg pain and consideration of surgical options.  He has experienced sciatic pain for 25 years, primarily affecting his left leg. The pain flares for about thirty minutes and worsens with standing or walking. He received an injection in December of the previous year, providing significant relief until recently. The pain has returned as a burning sensation in the left leg with occasional numbness, especially when standing for extended periods. Back pain has become more prominent over the last couple of years.  He works part-time at [employer], requiring prolonged standing, and experiences discomfort when getting into his car after work. He plays golf and tries to walk around his neighborhood, although not daily. The pain varies in intensity, with some days being extremely painful, making it difficult to get out of bed, while other days he can go for months without significant issues.   Bowel/Bladder Dysfunction: none  Conservative measures:  Physical therapy:  has participated in through [facility] from 09/11/2023 to 11/20/2023 Multimodal medical therapy including regular antiinflammatories:  Oxycontin , Hydrocodone , Gabapentin , Tylenol , Tizinidine Injections:   08/07/2023: Left L5-S1 ESI at [facility] 12/14/2010: Left L5-S1 ESI at [facility]  Past Surgery: no spinal surgeries   Martin Archer has no symptoms of cervical myelopathy.  The symptoms are causing a significant impact on the patient's life.   I have utilized the care everywhere function in epic to review the outside records available from external health systems.   Review of Systems:  A 10 point review of systems is  negative, except for the pertinent positives and negatives detailed in the HPI.  Past Medical History: Past Medical History:  Diagnosis Date   Anemia, unspecified    Arthritis    Diverticulosis of colon (without mention of hemorrhage)    Gastric ulcer with hemorrhage and obstruction 2011   Dr Jakie Finn [facility]   GERD (gastroesophageal reflux disease)    hx   Hyperlipemia    Hypertension    Personal history of colonic polyps 01/30/2010   hyperplastic rectal   Sleep apnea    on CPAP; [facility]   Stroke [facility])    mini strokes-bilateral carotid endarterectomy   Substance abuse (HCC)    History of alcohol  abuse    Past Surgical History: Past Surgical History:  Procedure Laterality Date   AORTIC ARCH ANGIOGRAPHY N/A 04/26/2021   Procedure: AORTIC ARCH ANGIOGRAPHY;  Surgeon: Darron Deatrice LABOR, MD;  Location: [facility] INVASIVE CV LAB;  Service: Cardiovascular;  Laterality: N/A;   CARDIAC CATHETERIZATION  01/25/2021   CAROTID ENDARTERECTOMY  11/2006   left Dr JINNY JONETTA Collum   COLONOSCOPY  2011   DP-positive FOBT/IDA   ENDARTERECTOMY Right 01/19/2015   Procedure: RIGHT CAROTID ENDARTERECTOMY WITH PATCH ANGIOPLASTY;  Surgeon: Lynwood JONETTA Collum, MD;  Location: [facility] OR;  Service: Vascular;  Laterality: Right;   ENDARTERECTOMY Right 06/27/2021   Procedure: RIGHT REDO CAROTID ARTERY ENDARTERECTOMY & PLACEMENT OF 15Fr DRAIN;  Surgeon: Eliza Lonni RAMAN, MD;  Location: [facility] OR;  Service: Vascular;  Laterality: Right;   ESOPHAGOGASTRODUODENOSCOPY Left 06/17/2013   Procedure: ESOPHAGOGASTRODUODENOSCOPY (EGD);  Surgeon: Elsie Cree, MD;  Location: [facility] ENDOSCOPY;  Service: Endoscopy;  Laterality: Left;   ESOPHAGOGASTRODUODENOSCOPY N/A 09/20/2015   Procedure:  ESOPHAGOGASTRODUODENOSCOPY (EGD);  Surgeon: Gwendlyn ONEIDA Buddy, MD;  Location: [facility] ENDOSCOPY;  Service: Endoscopy;  Laterality: N/A;   FOOT SURGERY Bilateral 2020   Taylor's foot   INGUINAL HERNIA REPAIR  1963   NASAL FRACTURE SURGERY   1974   PATCH ANGIOPLASTY Right 06/27/2021   Procedure: PATCH ANGIOPLASTY USING GEORGE BIOLOGIC PATCH;  Surgeon: Eliza Lonni RAMAN, MD;  Location: [facility] OR;  Service: Vascular;  Laterality: Right;   PERIPHERAL VASCULAR INTERVENTION  04/26/2021   Procedure: PERIPHERAL VASCULAR INTERVENTION;  Surgeon: Darron Deatrice LABOR, MD;  Location: [facility] INVASIVE CV LAB;  Service: Cardiovascular;;   RIGHT/LEFT HEART CATH AND CORONARY ANGIOGRAPHY N/A 01/25/2021   Procedure: RIGHT/LEFT HEART CATH AND CORONARY ANGIOGRAPHY;  Surgeon: Dann Candyce RAMAN, MD;  Location: [facility] INVASIVE CV LAB;  Service: Cardiovascular;  Laterality: N/A;   TEE WITHOUT CARDIOVERSION N/A 01/17/2015   Procedure: TRANSESOPHAGEAL ECHOCARDIOGRAM (TEE);  Surgeon: Jerel Balding, MD;  Location: [facility] ENDOSCOPY;  Service: Cardiovascular;  Laterality: N/A;   WISDOM TOOTH EXTRACTION      Allergies: Allergies as of 06/16/2024 - Review Complete 06/16/2024  Allergen Reaction Noted   Adderall [amphetamine-dextroamphet er] Diarrhea 07/14/2013   Prozac  [fluoxetine  hcl] Diarrhea 07/14/2013   Sertraline hcl Diarrhea 07/01/2009   Advil [ibuprofen] Diarrhea 01/24/2021    Medications:  Current Outpatient Medications:    acetaminophen  (TYLENOL ) 500 MG tablet, Take 500 mg by mouth every 6 (six) hours as needed., Disp: , Rfl:    amLODipine  (NORVASC ) 5 MG tablet, Take 1 tablet (5 mg total) by mouth daily., Disp: 90 tablet, Rfl: 4   aspirin  EC 81 MG tablet, Take 1 tablet (81 mg total) by mouth daily. Swallow whole., Disp: 30 tablet, Rfl: 11   atorvastatin  (LIPITOR ) 80 MG tablet, TAKE 1 TABLET BY MOUTH DAILY, Disp: 90 tablet, Rfl: 3   Carboxymethylcellul-Glycerin (LUBRICATING EYE DROPS OP), Place 1 drop into both eyes daily as needed (dry eyes)., Disp: , Rfl:    carvedilol  (COREG ) 25 MG tablet, TAKE 1 TABLET(25 MG) BY MOUTH TWICE DAILY, Disp: 180 tablet, Rfl: 3   clopidogrel  (PLAVIX ) 75 MG tablet, TAKE 1 TABLET BY MOUTH DAILY WITH BREAKFAST, Disp: 90 tablet,  Rfl: 1   doxylamine , Sleep, (UNISOM ) 25 MG tablet, Take 25 mg by mouth at bedtime as needed for sleep., Disp: , Rfl:    Dulaglutide  (TRULICITY ) 0.75 MG/0.5ML SOPN, Inject 0.75 mg into the skin once a week., Disp: 6 mL, Rfl: 3   ezetimibe  (ZETIA ) 10 MG tablet, TAKE 1 TABLET(10 MG) BY MOUTH DAILY, Disp: 90 tablet, Rfl: 3   furosemide  (LASIX ) 20 MG tablet, TAKE 1 TABLET(20 MG) BY MOUTH DAILY AS NEEDED FOR LOWER EXTREMITY SWELLING, Disp: 90 tablet, Rfl: 2   gabapentin  (NEURONTIN ) 300 MG capsule, Take 1 capsule (300 mg total) by mouth 3 (three) times daily as needed., Disp: 90 capsule, Rfl: 3   HYDROcodone -acetaminophen  (NORCO/VICODIN) 5-325 MG tablet, Take 1 tablet by mouth every 6 (six) hours as needed., Disp: 15 tablet, Rfl: 0   JARDIANCE  10 MG TABS tablet, TAKE 1 TABLET(10 MG) BY MOUTH DAILY BEFORE BREAKFAST, Disp: 90 tablet, Rfl: 3   LOKELMA 10 g PACK packet, Take by mouth., Disp: , Rfl:    MOUNJARO  5 MG/0.5ML Pen, ADMINISTER 5 MG UNDER THE SKIN 1 TIME A WEEK, Disp: 6 mL, Rfl: 11   Multiple Vitamin (MULTIVITAMIN WITH MINERALS) TABS tablet, Take 1 tablet by mouth daily., Disp: , Rfl:    sacubitril -valsartan  (ENTRESTO ) 97-103 MG, TAKE 1 TABLET BY MOUTH TWICE DAILY, Disp: 180 tablet,  Rfl: 2   sildenafil  (VIAGRA ) 100 MG tablet, Take 0.5-1 tablets (50-100 mg total) by mouth daily as needed for erectile dysfunction., Disp: 5 tablet, Rfl: 11   triamcinolone cream (KENALOG) 0.1 %, Apply topically 2 (two) times daily as needed., Disp: , Rfl:   Social History: Social History   Tobacco Use   Smoking status: Former    Current packs/day: 0.00    Types: Cigarettes    Quit date: 06/22/2015    Years since quitting: 8.9   Smokeless tobacco: Never   Tobacco comments:    smoked 1976-2007 & 2009- present , up to 2 ppd, average > 1ppd  Vaping Use   Vaping status: Never Used  Substance Use Topics   Alcohol  use: Not Currently    Alcohol /week: 0.0 standard drinks of alcohol    Drug use: No    Family  Medical History: Family History  Problem Relation Age of Onset   Diabetes Father    Hypertension Father    Stroke Father        in late 75s   Heart attack Mother        in 82s   Heart attack Brother 47   Diabetes Paternal Grandfather    Cancer Maternal Grandfather        unknown type   Heart attack Maternal Grandfather 64   Stroke Paternal Uncle        in late 110s   Colon cancer Neg Hx    Esophageal cancer Neg Hx    Stomach cancer Neg Hx    Colon polyps Neg Hx    Rectal cancer Neg Hx     Physical Examination: Vitals:   06/16/24 0951  BP: 126/84    General: Patient is in no apparent distress. Attention to examination is appropriate.  Neck:   Supple.  Full range of motion.  Respiratory: Patient is breathing without any difficulty.   NEUROLOGICAL:     Awake, alert, oriented to person, place, and time.  Speech is clear and fluent.   Cranial Nerves: Pupils equal round and reactive to light.  Facial tone is symmetric.  Facial sensation is symmetric. Shoulder shrug is symmetric. Tongue protrusion is midline.  There is no pronator drift.  Strength: Side Biceps Triceps Deltoid Interossei Grip Wrist Ext. Wrist Flex.  R 5 5 5 5 5 5 5   L 5 5 5 5 5 5 5    Side Iliopsoas Quads Hamstring PF DF EHL  R 5 5 5 5 5 5   L 5 5 5 5 5 5    Reflexes are 1+ and symmetric at the biceps, triceps, brachioradialis, patella and achilles.   Hoffman's is absent.   Bilateral upper and lower extremity sensation is intact to light touch.    No evidence of dysmetria noted.  Gait is normal.     Medical Decision Making  Imaging: MRI L spine 06/26/2023 Disc levels:   Sagittal images through the lower thoracic spine demonstrate loss of disc height with a broad-based disc protrusion at T10-11. No resulting cord deformity or significant foraminal narrowing. No significant disc space findings at T11-12 or T12-L1.   L1-2: Mild loss of disc height with mild disc bulging and mild bilateral facet  hypertrophy. No spinal stenosis or nerve root encroachment.   L2-3: Preserved disc height with mild disc bulging and mild bilateral facet hypertrophy. No significant spinal stenosis or nerve root encroachment.   L3-4: Mild loss of disc height with mild disc bulging and mild to moderate facet and  ligamentous hypertrophy. No significant spinal stenosis or nerve root encroachment.   L4-5: Preserved disc height with mild disc bulging. Mild to moderate bilateral facet hypertrophy. No significant spinal stenosis or nerve root encroachment.   L5-S1: As above, chronic bilateral L5 pars defects with progressive anterolisthesis and disc uncovering compared with remote MRI. There is probable interbody ankylosis at this level. There is mild central canal stenosis and mild lateral recess narrowing bilaterally. Chronic severe foraminal narrowing bilaterally with probable chronic bilateral L5 nerve root encroachment.   IMPRESSION: 1. 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 There is chronic severe foraminal narrowing bilaterally and probable chronic bilateral L5 nerve root encroachment. 2. Mild spondylosis at the other lumbar levels without significant spinal stenosis or nerve root encroachment. 3. No acute osseous findings.     Electronically Signed   By: Elsie Perone M.D.   On: 07/04/2023 10:12  I have personally reviewed the images and agree with the above interpretation.  Assessment and Plan: Mr. Boozer is a pleasant 63 y.o. male with intermittent radicular pain due to severe compression of the L5 nerve roots bilaterally but worse on the left than the right.  This is due to a pars defect with a grade 2/3 anterolisthesis.  He is currently less symptomatic and would like to try an injection.  This is scheduled for next week.  I will touch base with him 2 weeks after his injection.  If he is not significantly  improved, we will proceed with a CT scan and consider L5-S1 anterior lumbar interbody fusion.   I spent a total of 30 minutes in this patient's care today. This time was spent reviewing pertinent records including imaging studies, obtaining and confirming history, performing a directed evaluation, formulating and discussing my recommendations, and documenting the visit within the medical record.     Thank you for involving me in the care of this patient.      Aniella Wandrey K. Clois MD, [facility] Neurosurgery

## 2024-06-10 NOTE — Progress Notes (Signed)
 New Patient Pulmonology Office Visit   Subjective:  Patient ID: Martin Archer, male    DOB: [birth year]  MRN: [id number]  Referred by: Norleen Lynwood ORN, MD  CC: No chief complaint on file.   HPI Martin Archer is a 63 y.o. male with TIA status post bilateral carotid endarterectomy, hypertension, hyperlipidemia, GERD, PUD, OSA on CPAP presents for evaluation of sleep apnea.  OSA history: Dx long time ago in early 2000s. Used it for 10 years. The CPAP machine broke and never got it back again.  HST in 2022 showed severe OSA with significant desaturation.  Advised to get CPAP titration study.  Things were lost.   Mouth breather: y Preferred sleeping position: side.   Sleep related Symptoms:  Snoring- y Witnessed apnea- y Gasping/choking- y morning HA/dry mouth- y/y tired on awakening, excessive daytime sleepiness- does not feel tired.    Sleep routine: takes unisom . Unsure of strength. Takes 1 hour before.  -Bed: 10-11p. Falls asleep quickly.   -Nocturnal awakenings: once to urinate.  -Wake: 6-7a.  -Napping:no  Social Hist/Habits:  -Caffeine:once a day coffee. However, drinks caffeinated soda through the day.  -Alcohol : used to AUD. Now does not drinks.  -Nicotine :1 pk x 40 years. Quit in 2021.  -Occupation: own business and travel for business. No exposure.   PRIOR TESTS and IMAGING: PSG/HSAT: HST March 2022: Severe OSA with AHI 55, O2 nadir 75%, O2 desaturation for 176 minutes.  ECHO: Echo 01/14/2024: EF 58%, grade 1 diastolic dysfunction no valvular abnormalities.  MRI Brain: MRI 2017 did not show any acute or chronic infarcts on the brain but there were chronically occluded left internal carotid artery.      No data to display          Allergies: Adderall [amphetamine-dextroamphet er], Prozac  [fluoxetine  hcl], Sertraline hcl, and Advil [ibuprofen]  Current Outpatient Medications:    acetaminophen  (TYLENOL ) 500 MG tablet, Take 500 mg by mouth every 6  (six) hours as needed., Disp: , Rfl:    amLODipine  (NORVASC ) 5 MG tablet, Take 1 tablet (5 mg total) by mouth daily., Disp: 90 tablet, Rfl: 4   aspirin  EC 81 MG tablet, Take 1 tablet (81 mg total) by mouth daily. Swallow whole., Disp: 30 tablet, Rfl: 11   atorvastatin  (LIPITOR ) 80 MG tablet, TAKE 1 TABLET BY MOUTH DAILY, Disp: 90 tablet, Rfl: 3   Carboxymethylcellul-Glycerin (LUBRICATING EYE DROPS OP), Place 1 drop into both eyes daily as needed (dry eyes)., Disp: , Rfl:    carvedilol  (COREG ) 25 MG tablet, TAKE 1 TABLET(25 MG) BY MOUTH TWICE DAILY, Disp: 180 tablet, Rfl: 3   clopidogrel  (PLAVIX ) 75 MG tablet, TAKE 1 TABLET BY MOUTH DAILY WITH BREAKFAST, Disp: 90 tablet, Rfl: 1   doxylamine , Sleep, (UNISOM ) 25 MG tablet, Take 25 mg by mouth at bedtime as needed for sleep., Disp: , Rfl:    Dulaglutide  (TRULICITY ) 0.75 MG/0.5ML SOPN, Inject 0.75 mg into the skin once a week., Disp: 6 mL, Rfl: 3   ezetimibe  (ZETIA ) 10 MG tablet, TAKE 1 TABLET(10 MG) BY MOUTH DAILY, Disp: 90 tablet, Rfl: 3   furosemide  (LASIX ) 20 MG tablet, TAKE 1 TABLET(20 MG) BY MOUTH DAILY AS NEEDED FOR LOWER EXTREMITY SWELLING, Disp: 90 tablet, Rfl: 2   gabapentin  (NEURONTIN ) 300 MG capsule, Take 1 capsule (300 mg total) by mouth 3 (three) times daily as needed., Disp: 90 capsule, Rfl: 3   HYDROcodone -acetaminophen  (NORCO/VICODIN) 5-325 MG tablet, Take 1 tablet by mouth every 6 (six) hours as  needed., Disp: 15 tablet, Rfl: 0   JARDIANCE  10 MG TABS tablet, TAKE 1 TABLET(10 MG) BY MOUTH DAILY BEFORE BREAKFAST, Disp: 90 tablet, Rfl: 3   LOKELMA 10 g PACK packet, Take by mouth., Disp: , Rfl:    MOUNJARO  5 MG/0.5ML Pen, ADMINISTER 5 MG UNDER THE SKIN 1 TIME A WEEK, Disp: 6 mL, Rfl: 11   Multiple Vitamin (MULTIVITAMIN WITH MINERALS) TABS tablet, Take 1 tablet by mouth daily., Disp: , Rfl:    sacubitril -valsartan  (ENTRESTO ) 97-103 MG, TAKE 1 TABLET BY MOUTH TWICE DAILY, Disp: 180 tablet, Rfl: 2   sildenafil  (VIAGRA ) 100 MG tablet, Take  0.5-1 tablets (50-100 mg total) by mouth daily as needed for erectile dysfunction. (Patient not taking: Reported on 05/18/2024), Disp: 5 tablet, Rfl: 11   triamcinolone cream (KENALOG) 0.1 %, Apply topically 2 (two) times daily as needed. (Patient not taking: Reported on 05/18/2024), Disp: , Rfl:  Past Medical History:  Diagnosis Date   Anemia, unspecified    Arthritis    Diverticulosis of colon (without mention of hemorrhage)    Gastric ulcer with hemorrhage and obstruction 2011   Dr Jakie Finn GI   GERD (gastroesophageal reflux disease)    hx   Hyperlipemia    Hypertension    Personal history of colonic polyps 01/30/2010   hyperplastic rectal   Sleep apnea    on CPAP;  Sleep Medicine   Stroke [facility])    mini strokes-bilateral carotid endarterectomy   Substance abuse (HCC)    History of alcohol  abuse   Past Surgical History:  Procedure Laterality Date   AORTIC ARCH ANGIOGRAPHY N/A 04/26/2021   Procedure: AORTIC ARCH ANGIOGRAPHY;  Surgeon: Darron Deatrice LABOR, MD;  [facility];  Service: Cardiovascular;  Laterality: N/A;   CARDIAC CATHETERIZATION  01/25/2021   CAROTID ENDARTERECTOMY  11/2006   left Dr JINNY JONETTA Collum   COLONOSCOPY  2011   DP-positive FOBT/IDA   ENDARTERECTOMY Right 01/19/2015   Procedure: RIGHT CAROTID ENDARTERECTOMY WITH PATCH ANGIOPLASTY;  Surgeon: Lynwood JONETTA Collum, MD;  Location: [facility] OR;  Service: Vascular;  Laterality: Right;   ENDARTERECTOMY Right 06/27/2021   Procedure: RIGHT REDO CAROTID ARTERY ENDARTERECTOMY & PLACEMENT OF 15Fr DRAIN;  Surgeon: Eliza Lonni RAMAN, MD;  Location: [facility] OR;  Service: Vascular;  Laterality: Right;   ESOPHAGOGASTRODUODENOSCOPY Left 06/17/2013   Procedure: ESOPHAGOGASTRODUODENOSCOPY (EGD);  Surgeon: Elsie Cree, MD;  Location: [facility] ENDOSCOPY;  Service: Endoscopy;  Laterality: Left;   ESOPHAGOGASTRODUODENOSCOPY N/A 09/20/2015   Procedure: ESOPHAGOGASTRODUODENOSCOPY (EGD);  Surgeon: Gwendlyn ONEIDA Buddy, MD;   Location: [facility] ENDOSCOPY;  Service: Endoscopy;  Laterality: N/A;   FOOT SURGERY Bilateral 2020   Taylor's foot   INGUINAL HERNIA REPAIR  1963   NASAL FRACTURE SURGERY  1974   PATCH ANGIOPLASTY Right 06/27/2021   Procedure: PATCH ANGIOPLASTY USING GEORGE BIOLOGIC PATCH;  Surgeon: Eliza Lonni RAMAN, MD;  Location: [facility] OR;  Service: Vascular;  Laterality: Right;   PERIPHERAL VASCULAR INTERVENTION  04/26/2021   Procedure: PERIPHERAL VASCULAR INTERVENTION;  Surgeon: Darron Deatrice LABOR, MD;  [facility]: [facility];  Service: Cardiovascular;;   RIGHT/LEFT HEART CATH AND CORONARY ANGIOGRAPHY N/A 01/25/2021   Procedure: RIGHT/LEFT HEART CATH AND CORONARY ANGIOGRAPHY;  Surgeon: Dann Candyce RAMAN, MD;  Location: [facility] INVASIVE CV LAB;  Service: Cardiovascular;  Laterality: N/A;   TEE WITHOUT CARDIOVERSION N/A 01/17/2015   Procedure: TRANSESOPHAGEAL ECHOCARDIOGRAM (TEE);  Surgeon: Jerel Balding, MD;  Location: [facility] ENDOSCOPY;  Service: Cardiovascular;  Laterality: N/A;   WISDOM TOOTH EXTRACTION  Family History  Problem Relation Age of Onset   Diabetes Father    Hypertension Father    Stroke Father        in late 65s   Heart attack Mother        in 61s   Heart attack Brother 12   Diabetes Paternal Grandfather    Cancer Maternal Grandfather        unknown type   Heart attack Maternal Grandfather 87   Stroke Paternal Uncle        in late 43s   Colon cancer Neg Hx    Esophageal cancer Neg Hx    Stomach cancer Neg Hx    Colon polyps Neg Hx    Rectal cancer Neg Hx    Social History   Socioeconomic History   Marital status: Divorced    Spouse name: Not on file   Number of children: 2   Years of education: Not on file   Highest education level: Associate degree: academic program  Occupational History   Occupation: self employeed Investment banker, corporate: [employer]   Tobacco Use   Smoking status: Former    Current packs/day: 0.00    Types: Cigarettes    Quit date: 06/22/2015     Years since quitting: 8.9   Smokeless tobacco: Never   Tobacco comments:    smoked 1976-2007 & 2009- present , up to 2 ppd, average > 1ppd  Vaping Use   Vaping status: Never Used  Substance and Sexual Activity   Alcohol  use: Not Currently    Alcohol /week: 0.0 standard drinks of alcohol    Drug use: No   Sexual activity: Yes  Other Topics Concern   Not on file  Social History Narrative   Lives alone.  Daughter lives in area.    Social Drivers of Corporate investment banker Strain: Low Risk  (02/25/2024)   Overall Financial Resource Strain (CARDIA)    Difficulty of Paying Living Expenses: Not hard at all  Food Insecurity: No Food Insecurity (02/25/2024)   Hunger Vital Sign    Worried About Running Out of Food in the Last Year: Never true    Ran Out of Food in the Last Year: Never true  Transportation Needs: No Transportation Needs (02/25/2024)   PRAPARE - Administrator, Civil Service (Medical): No    Lack of Transportation (Non-Medical): No  Physical Activity: Insufficiently Active (02/25/2024)   Exercise Vital Sign    Days of Exercise per Week: 3 days    Minutes of Exercise per Session: 30 min  Stress: No Stress Concern Present (02/25/2024)   [employer] of Occupational Health - Occupational Stress Questionnaire    Feeling of Stress: Not at all  Social Connections: Moderately Integrated (02/25/2024)   Social Connection and Isolation Panel    Frequency of Communication with Friends and Family: More than three times a week    Frequency of Social Gatherings with Friends and Family: Once a week    Attends Religious Services: More than 4 times per year    Active Member of [employer] or Organizations: Yes    Attends Banker Meetings: More than 4 times per year    Marital Status: Divorced  Intimate Partner Violence: Not At Risk (02/25/2024)   Humiliation, Afraid, Rape, and Kick questionnaire    Fear of Current or Ex-Partner: No    Emotionally Abused: No     Physically Abused: No    Sexually Abused: No  Objective:  There were no vitals taken for this visit. BMI Readings from Last 3 Encounters:  06/10/24 28.59 kg/m  05/18/24 28.55 kg/m  05/13/24 28.13 kg/m    Physical Exam: CONSTITUTIONAL: NAD, well-appearing NASAL/OROPHARYNX:  Normal mucosa. No septal deviation. No hypertrophy of inferior turbinates. Modified Mallampati score 3.  CV: RRR s1s2 nl, no murmurs  RESP: Clear to auscultation, normal respiratory effort   NEURO: CN II/XII grossly intact PSYCH: Alert & oriented x 3, Euthymic, appropriate affect  Diagnostic Review:  Last metabolic panel Lab Results  Component Value Date   GLUCOSE 78 04/29/2024   NA 138 04/29/2024   K 5.1 Hemolysis seen... 04/29/2024   CL 102 04/29/2024   CO2 27 04/29/2024   BUN 35 (H) 04/29/2024   CREATININE 1.85 (H) 04/29/2024   GFR 37.81 (L) 04/29/2024   CALCIUM  8.8 04/29/2024   PROT 6.8 04/29/2024   ALBUMIN 4.2 04/29/2024   LABGLOB 2.1 04/13/2021   AGRATIO 2.0 04/13/2021   BILITOT 0.5 04/29/2024   ALKPHOS 96 04/29/2024   AST 21 04/29/2024   ALT 16 04/29/2024   ANIONGAP 8 06/28/2021         Assessment & Plan:   Assessment & Plan OSA (obstructive sleep apnea) Nocturnal hypoxemia I discussed with the patient the pathophysiology of obstructive sleep apnea, its association with weight, and its negative effects on hypertension, diabetes, mental health, A-fib, stroke if left untreated.  I briefly discussed the treatment options for obstructive sleep apnea  Based on previous sleep study he had significant hypoxemia during sleep.  If hypoxemia not resolved after being on CPAP will have to evaluate for underlying lung disease. Orders:   Split night study; Future  Encounter for screening for lung cancer Patient agreed for lung cancer screening after discussion. Orders:   Ambulatory Referral for Lung Cancer Scre    He/She was counselled about not driving while drowsy which is common  side effect of sleep related disorders.   No follow-ups on file.   Time spent: 30 min.   Jamiria Langill, MD

## 2024-06-16 ENCOUNTER — Encounter: Payer: Self-pay | Admitting: Neurosurgery

## 2024-06-16 ENCOUNTER — Ambulatory Visit (INDEPENDENT_AMBULATORY_CARE_PROVIDER_SITE_OTHER): Admitting: Neurosurgery

## 2024-06-16 VITALS — BP 126/84 | Ht 68.0 in | Wt 187.0 lb

## 2024-06-16 DIAGNOSIS — M4310 Spondylolisthesis, site unspecified: Secondary | ICD-10-CM

## 2024-06-16 DIAGNOSIS — M4316 Spondylolisthesis, lumbar region: Secondary | ICD-10-CM | POA: Diagnosis not present

## 2024-06-16 DIAGNOSIS — M5416 Radiculopathy, lumbar region: Secondary | ICD-10-CM

## 2024-06-23 NOTE — Discharge Instructions (Signed)

## 2024-06-24 ENCOUNTER — Inpatient Hospital Stay
Admission: RE | Admit: 2024-06-24 | Discharge: 2024-06-24 | Disposition: A | Source: Ambulatory Visit | Attending: Family Medicine | Admitting: Family Medicine

## 2024-06-24 DIAGNOSIS — G8929 Other chronic pain: Secondary | ICD-10-CM

## 2024-06-24 MED ORDER — IOPAMIDOL (ISOVUE-M 200) INJECTION 41%
1.0000 mL | Freq: Once | INTRAMUSCULAR | Status: AC
  Administered 2024-06-24: 1 mL via EPIDURAL

## 2024-06-24 MED ORDER — METHYLPREDNISOLONE ACETATE 40 MG/ML INJ SUSP (RADIOLOG
80.0000 mg | Freq: Once | INTRAMUSCULAR | Status: AC
  Administered 2024-06-24: 80 mg via EPIDURAL

## 2024-07-07 ENCOUNTER — Ambulatory Visit (INDEPENDENT_AMBULATORY_CARE_PROVIDER_SITE_OTHER): Admitting: Internal Medicine

## 2024-07-07 ENCOUNTER — Encounter: Payer: Self-pay | Admitting: Internal Medicine

## 2024-07-07 ENCOUNTER — Ambulatory Visit

## 2024-07-07 ENCOUNTER — Ambulatory Visit: Payer: Self-pay

## 2024-07-07 VITALS — BP 120/84 | HR 94 | Temp 98.8°F | Ht 68.0 in | Wt 188.0 lb

## 2024-07-07 DIAGNOSIS — R062 Wheezing: Secondary | ICD-10-CM | POA: Diagnosis not present

## 2024-07-07 DIAGNOSIS — I10 Essential (primary) hypertension: Secondary | ICD-10-CM | POA: Diagnosis not present

## 2024-07-07 DIAGNOSIS — R059 Cough, unspecified: Secondary | ICD-10-CM | POA: Insufficient documentation

## 2024-07-07 DIAGNOSIS — R051 Acute cough: Secondary | ICD-10-CM

## 2024-07-07 DIAGNOSIS — R6889 Other general symptoms and signs: Secondary | ICD-10-CM

## 2024-07-07 DIAGNOSIS — E559 Vitamin D deficiency, unspecified: Secondary | ICD-10-CM

## 2024-07-07 LAB — POCT INFLUENZA A/B
Influenza A, POC: NEGATIVE
Influenza B, POC: NEGATIVE

## 2024-07-07 LAB — POC COVID19 BINAXNOW: SARS Coronavirus 2 Ag: NEGATIVE

## 2024-07-07 MED ORDER — PREDNISONE 10 MG PO TABS: ORAL_TABLET | ORAL | 0 refills | Status: AC

## 2024-07-07 MED ORDER — LEVOFLOXACIN 500 MG PO TABS: 500.0000 mg | ORAL_TABLET | Freq: Every day | ORAL | 0 refills | Status: AC

## 2024-07-07 MED ORDER — HYDROCODONE BIT-HOMATROP MBR 5-1.5 MG/5ML PO SOLN: 5.0000 mL | Freq: Four times a day (QID) | ORAL | 0 refills | Status: AC | PRN

## 2024-07-07 NOTE — Progress Notes (Signed)
 Patient ID: Toribio Lynwood Columbus, male   DOB: 09-03-1960, 63 y.o.   MRN: [id number]        Chief Complaint: follow up prod cough, wheezing, htn, low vit d        HPI:  Kaston Faughn is a 63 y.o. male Here with acute onset mild to mod 2-3 days ST, HA, general weakness and malaise, with prod cough greenish sputum with mild wheezing sob, but Pt denies chest pain, orthopnea, PND, increased LE swelling, palpitations, dizziness or syncope.         Wt Readings from Last 3 Encounters:  07/07/24 188 lb (85.3 kg)  06/16/24 187 lb (84.8 kg)  06/10/24 188 lb (85.3 kg)   BP Readings from Last 3 Encounters:  07/07/24 120/84  06/24/24 (!) 163/92  06/16/24 126/84         Past Medical History:  Diagnosis Date   Anemia, unspecified    Arthritis    Diverticulosis of colon (without mention of hemorrhage)    Gastric ulcer with hemorrhage and obstruction 2011   Dr Jakie Finn GI   GERD (gastroesophageal reflux disease)    hx   Hyperlipemia    Hypertension    Personal history of colonic polyps 01/30/2010   hyperplastic rectal   Sleep apnea    on CPAP; [facility]   Stroke [facility])    mini strokes-bilateral carotid endarterectomy   Substance abuse (HCC)    History of alcohol  abuse   Past Surgical History:  Procedure Laterality Date   AORTIC ARCH ANGIOGRAPHY N/A 04/26/2021   Procedure: AORTIC ARCH ANGIOGRAPHY;  Surgeon: Darron Deatrice LABOR, MD;  [facility];  Service: Cardiovascular;  Laterality: N/A;   CARDIAC CATHETERIZATION  01/25/2021   CAROTID ENDARTERECTOMY  11/2006   left Dr JINNY JONETTA Collum   COLONOSCOPY  2011   DP-positive FOBT/IDA   ENDARTERECTOMY Right 01/19/2015   Procedure: RIGHT CAROTID ENDARTERECTOMY WITH PATCH ANGIOPLASTY;  Surgeon: Lynwood JONETTA Collum, MD;  Location: [facility] OR;  Service: Vascular;  Laterality: Right;   ENDARTERECTOMY Right 06/27/2021   Procedure: RIGHT REDO CAROTID ARTERY ENDARTERECTOMY & PLACEMENT OF 15Fr DRAIN;  Surgeon: Eliza Lonni RAMAN, MD;  Location: [facility] OR;  Service: Vascular;  Laterality: Right;   ESOPHAGOGASTRODUODENOSCOPY Left 06/17/2013   Procedure: ESOPHAGOGASTRODUODENOSCOPY (EGD);  Surgeon: Elsie Cree, MD;  Location[facility] ENDOSCOPY;  Service: Endoscopy;  Laterality: Left;   ESOPHAGOGASTRODUODENOSCOPY N/A 09/20/2015   Procedure: ESOPHAGOGASTRODUODENOSCOPY (EGD);  Surgeon: Gwendlyn ONEIDA Buddy, MD;  Location: [facility] ENDOSCOPY;  Service: Endoscopy;  Laterality: N/A;   FOOT SURGERY Bilateral 2020   Taylor's foot   INGUINAL HERNIA REPAIR  1963   NASAL FRACTURE SURGERY  1974   PATCH ANGIOPLASTY Right 06/27/2021   Procedure: PATCH ANGIOPLASTY USING GEORGE BIOLOGIC PATCH;  Surgeon: Eliza Lonni RAMAN, MD;  Location: [facility] OR;  Service: Vascular;  Laterality: Right;   PERIPHERAL VASCULAR INTERVENTION  04/26/2021   Procedure: PERIPHERAL VASCULAR INTERVENTION;  Surgeon: Darron Deatrice LABOR, MD;  [facility] INV[facility] LAB;  Service: Cardiovascular;;   RIGHT/LEFT HEART CATH AND CORONARY ANGIOGRAPHY N/A 01/25/2021   Procedure: RIGHT/LEFT HEART CATH AND CORONARY ANGIOGRAPHY;  Surgeon: Dann Candyce RAMAN, MD;  Location: [facility] INVASIVE CV LAB;  Service: Cardiovascular;  Laterality: N/A;   TEE WITHOUT CARDIOVERSION N/A 01/17/2015   Procedure: TRANSESOPHAGEAL ECHOCARDIOGRAM (TEE);  Surgeon: Jerel Balding, MD;  Location: [facility] ENDOSCOPY;  Service: Cardiovascular;  Laterality: N/A;   WISDOM TOOTH EXTRACTION      reports that he quit smoking about 9  years ago. His smoking use included cigarettes. He has never used smokeless tobacco. He reports that he does not currently use alcohol . He reports that he does not use drugs. family history includes Cancer in his maternal grandfather; Diabetes in his father and paternal grandfather; Heart attack in his mother; Heart attack (age of onset: 61) in his brother; Heart attack (age of onset: 20) in his maternal grandfather; Hypertension in his father; Stroke in his father and paternal  uncle. Allergies  Allergen Reactions   Adderall [Amphetamine-Dextroamphet Er] Diarrhea   Prozac  [Fluoxetine  Hcl] Diarrhea   Sertraline Hcl Diarrhea   Advil [Ibuprofen] Diarrhea   Current Outpatient Medications on File Prior to Visit  Medication Sig Dispense Refill   acetaminophen  (TYLENOL ) 500 MG tablet Take 500 mg by mouth every 6 (six) hours as needed.     amLODipine  (NORVASC ) 5 MG tablet Take 1 tablet (5 mg total) by mouth daily. 90 tablet 4   aspirin  EC 81 MG tablet Take 1 tablet (81 mg total) by mouth daily. Swallow whole. 30 tablet 11   atorvastatin  (LIPITOR ) 80 MG tablet TAKE 1 TABLET BY MOUTH DAILY 90 tablet 3   Carboxymethylcellul-Glycerin (LUBRICATING EYE DROPS OP) Place 1 drop into both eyes daily as needed (dry eyes).     carvedilol  (COREG ) 25 MG tablet TAKE 1 TABLET(25 MG) BY MOUTH TWICE DAILY 180 tablet 3   clopidogrel  (PLAVIX ) 75 MG tablet TAKE 1 TABLET BY MOUTH DAILY WITH BREAKFAST 90 tablet 1   doxylamine , Sleep, (UNISOM ) 25 MG tablet Take 25 mg by mouth at bedtime as needed for sleep.     Dulaglutide  (TRULICITY ) 0.75 MG/0.5ML SOPN Inject 0.75 mg into the skin once a week. 6 mL 3   ezetimibe  (ZETIA ) 10 MG tablet TAKE 1 TABLET(10 MG) BY MOUTH DAILY 90 tablet 3   furosemide  (LASIX ) 20 MG tablet TAKE 1 TABLET(20 MG) BY MOUTH DAILY AS NEEDED FOR LOWER EXTREMITY SWELLING 90 tablet 2   gabapentin  (NEURONTIN ) 300 MG capsule Take 1 capsule (300 mg total) by mouth 3 (three) times daily as needed. 90 capsule 3   HYDROcodone -acetaminophen  (NORCO/VICODIN) 5-325 MG tablet Take 1 tablet by mouth every 6 (six) hours as needed. 15 tablet 0   JARDIANCE  10 MG TABS tablet TAKE 1 TABLET(10 MG) BY MOUTH DAILY BEFORE BREAKFAST 90 tablet 3   LOKELMA 10 g PACK packet Take by mouth.     MOUNJARO  5 MG/0.5ML Pen ADMINISTER 5 MG UNDER THE SKIN 1 TIME A WEEK 6 mL 11   Multiple Vitamin (MULTIVITAMIN WITH MINERALS) TABS tablet Take 1 tablet by mouth daily.     sacubitril -valsartan  (ENTRESTO ) 97-103 MG  TAKE 1 TABLET BY MOUTH TWICE DAILY 180 tablet 2   sildenafil  (VIAGRA ) 100 MG tablet Take 0.5-1 tablets (50-100 mg total) by mouth daily as needed for erectile dysfunction. 5 tablet 11   triamcinolone cream (KENALOG) 0.1 % Apply topically 2 (two) times daily as needed.     No current facility-administered medications on file prior to visit.        ROS:  All others reviewed and negative.  Objective        PE:  BP 120/84 (BP Location: Right Arm, Patient Position: Sitting, Cuff Size: Normal)   Pulse 94   Temp 98.8 F (37.1 C) (Oral)   Ht 5' 8 (1.727 m)   Wt 188 lb (85.3 kg)   SpO2 94%   BMI 28.59 kg/m  Constitutional: Pt appears mild ill               HENT: Head: NCAT.                Right Ear: External ear normal.                 Left Ear: External ear normal. Bilat tm's with mild erythema.  Max sinus areas non tender.  Pharynx with mild erythema, no exudate               Eyes: . Pupils are equal, round, and reactive to light. Conjunctivae and EOM are normal               Nose: without d/c or deformity               Neck: Neck supple. Gross normal ROM               Cardiovascular: Normal rate and regular rhythm.                 Pulmonary/Chest: Effort normal and breath sounds without rales but with few mild wheezing.                               Neurological: Pt is alert. At baseline orientation, motor grossly intact               Skin: Skin is warm. No rashes, no other new lesions, LE edema - none               Psychiatric: Pt behavior is normal without agitation   Micro: none  Cardiac tracings I have personally interpreted today:  none  Pertinent Radiological findings (summarize): none   Lab Results  Component Value Date   WBC 10.9 (H) 04/29/2024   HGB 13.8 04/29/2024   HCT 41.1 04/29/2024   PLT 274.0 04/29/2024   GLUCOSE 78 04/29/2024   CHOL 110 04/29/2024   TRIG 99.0 04/29/2024   HDL 41.00 04/29/2024   LDLDIRECT 48.0 03/28/2022   LDLCALC 50  04/29/2024   ALT 16 04/29/2024   AST 21 04/29/2024   NA 138 04/29/2024   K 5.1 Hemolysis seen... 04/29/2024   CL 102 04/29/2024   CREATININE 1.85 (H) 04/29/2024   BUN 35 (H) 04/29/2024   CO2 27 04/29/2024   TSH 1.62 04/29/2024   PSA 2.01 04/29/2024   INR 1.0 06/23/2021   HGBA1C 6.0 04/29/2024   MICROALBUR 1.6 04/29/2024   POCT - Covid - neg, Flu - neg  Assessment/Plan:  Wilkins Elpers Urbieta is a 63 y.o. White or Caucasian [1] male with  has a past medical history of Anemia, unspecified, Arthritis, Diverticulosis of colon (without mention of hemorrhage), Gastric ulcer with hemorrhage and obstruction (2011), GERD (gastroesophageal reflux disease), Hyperlipemia, Hypertension, Personal history of colonic polyps (01/30/2010), Sleep apnea, Stroke (HCC), and Substance abuse (HCC).  Cough Mild to mod, can't r/o pna, covid and flu negative, for cxr, also now for antibx course, cough med prn, to f/u any worsening symptoms or concerns  Wheezing Mild to mod, for prednisone  taper, to f/u any worsening symptoms or concerns  Essential hypertension BP Readings from Last 3 Encounters:  07/07/24 120/84  06/24/24 (!) 163/92  06/16/24 126/84   Stable, pt to continue medical treatment norvasc  5 every day, coreg  25 bid   Vitamin D  deficiency Last vitamin D  Lab Results  Component Value Date  VD25OH 51.04 04/29/2024   Stable, cont oral replacement  Followup: Return if symptoms worsen or fail to improve.  Lynwood Rush, MD 07/07/2024 8:44 PM [facility]

## 2024-07-07 NOTE — Patient Instructions (Signed)
 Please take all new medication as prescribed - the antibiotic, cough medicine, and prednisone   Please continue all other medications as before  Please have the pharmacy call with any other refills you may need.  Please continue your efforts at being more active, low cholesterol diet, and weight control.  Please keep your appointments with your specialists as you may have planned  Please go to the XRAY Department in the first floor for the x-ray testing  You will be contacted by phone if any changes need to be made immediately.  Otherwise, you will receive a letter about your results with an explanation, but please check with MyChart first.

## 2024-07-07 NOTE — Assessment & Plan Note (Signed)
 Mild to mod, for prednisone taper, to f/u any worsening symptoms or concerns

## 2024-07-07 NOTE — Assessment & Plan Note (Signed)
 BP Readings from Last 3 Encounters:  07/07/24 120/84  06/24/24 (!) 163/92  06/16/24 126/84   Stable, pt to continue medical treatment norvasc  5 every day, coreg  25 bid

## 2024-07-07 NOTE — Telephone Encounter (Signed)
 FYI Only or Action Required?: FYI only for provider: appointment scheduled on today.  Patient was last seen in primary care on 04/29/2024 by Norleen Lynwood ORN, MD.  Called Nurse Triage reporting Generalized Body Aches.  Symptoms began 2 days ago.  Interventions attempted: OTC medications  Symptoms are: unchanged.  Triage Disposition: Home Care  Patient/caregiver understands and will follow disposition?: No  Copied from CRM [id number]. Topic: Clinical - Red Word Triage >> Jul 07, 2024  8:08 AM Martin Archer wrote: Red Word that prompted transfer to Nurse Triage: pt is non stop coughing, sneezing, Severe headaches, body pain and aches. Reason for Disposition  [1] Muscle aches or pain AND [2] present < 7 days  Answer Assessment - Initial Assessment Questions 1. ONSET: When did the muscle aches or body pains start?      2 days 2. LOCATION: What part of your body is hurting? (e.g., entire body, arms, legs)      Entire body aches 3. SEVERITY: How bad is the pain? (Scale 1-10; or mild, moderate, severe)     7 5. FEVER: Do you have a fever? If Yes, ask: What is your temperature, how was it measured, and  when did it start?      I have not taken my temp, I have had some cold chills 6. OTHER SYMPTOMS: Do you have any other symptoms? (e.g., chest pain, cold or flu symptoms, rash, weakness, weight loss)     Coughing, sneezing, runny nose, sore throat  Taking OTC medication but it is not giving full relief. Pt requesting appt.  Protocols used: Muscle Aches and Body Pain-A-AH

## 2024-07-07 NOTE — Assessment & Plan Note (Signed)
 Last vitamin D  Lab Results  Component Value Date   VD25OH 51.04 04/29/2024   Stable, cont oral replacement

## 2024-07-07 NOTE — Assessment & Plan Note (Addendum)
 Mild to mod, can't r/o pna, covid and flu negative, for cxr, also now for antibx course, cough med prn, to f/u any worsening symptoms or concerns

## 2024-07-08 ENCOUNTER — Encounter: Payer: Self-pay | Admitting: Neurosurgery

## 2024-07-09 ENCOUNTER — Ambulatory Visit: Payer: Self-pay | Admitting: Internal Medicine

## 2024-07-13 ENCOUNTER — Ambulatory Visit (HOSPITAL_COMMUNITY)
Admission: RE | Admit: 2024-07-13 | Discharge: 2024-07-13 | Disposition: A | Discharge status: Home / Self Care | Payer: 59 | Source: Ambulatory Visit | Attending: Cardiovascular Disease | Admitting: Cardiovascular Disease

## 2024-07-13 DIAGNOSIS — I6523 Occlusion and stenosis of bilateral carotid arteries: Secondary | ICD-10-CM | POA: Insufficient documentation

## 2024-07-15 ENCOUNTER — Other Ambulatory Visit: Payer: Self-pay

## 2024-07-17 ENCOUNTER — Ambulatory Visit: Payer: Self-pay | Admitting: Cardiovascular Disease

## 2024-07-17 DIAGNOSIS — I6523 Occlusion and stenosis of bilateral carotid arteries: Secondary | ICD-10-CM

## 2024-07-17 MED ORDER — EZETIMIBE 10 MG PO TABS: 10.0000 mg | ORAL_TABLET | Freq: Every day | ORAL | 3 refills | Status: AC

## 2024-07-30 LAB — LAB REPORT - SCANNED
Albumin, Urine POC: 23.8
Creatinine, POC: 66.9 mg/dL
EGFR: 42
Microalb Creat Ratio: 36

## 2024-08-11 ENCOUNTER — Telehealth: Payer: Self-pay

## 2024-08-11 ENCOUNTER — Encounter (HOSPITAL_BASED_OUTPATIENT_CLINIC_OR_DEPARTMENT_OTHER): Payer: Self-pay

## 2024-08-11 ENCOUNTER — Ambulatory Visit (HOSPITAL_BASED_OUTPATIENT_CLINIC_OR_DEPARTMENT_OTHER)

## 2024-08-11 MED ORDER — TIRZEPATIDE 7.5 MG/0.5ML ~~LOC~~ SOAJ: 7.5000 mg | SUBCUTANEOUS | 3 refills | Status: AC

## 2024-08-11 NOTE — Addendum Note (Signed)
 Addended by: NORLEEN LYNWOOD ORN on: 08/11/2024 04:33 PM   Modules accepted: Orders

## 2024-08-11 NOTE — Telephone Encounter (Signed)
 Ok the mounjaro  was increased to 7.5 mg weekly, thanks

## 2024-08-11 NOTE — Telephone Encounter (Signed)
 Copied from CRM #[id number]. Topic: Clinical - Medication Question >> Aug 11, 2024 12:20 PM Rea ORN wrote: Reason for CRM: Pt calling to see if his dose of Mounjaro  can be increase.  Please call back to advise, [phone]

## 2024-08-12 NOTE — Telephone Encounter (Signed)
 Called and let Pt know

## 2024-09-02 ENCOUNTER — Encounter: Payer: Self-pay | Admitting: Cardiovascular Disease

## 2024-09-29 ENCOUNTER — Encounter: Payer: Self-pay | Admitting: Cardiovascular Disease

## 2024-09-29 ENCOUNTER — Ambulatory Visit: Admitting: Cardiovascular Disease

## 2024-09-29 VITALS — BP 122/78 | HR 90 | Ht 68.0 in | Wt 192.8 lb

## 2024-09-29 DIAGNOSIS — I771 Stricture of artery: Secondary | ICD-10-CM | POA: Diagnosis not present

## 2024-09-29 DIAGNOSIS — I10 Essential (primary) hypertension: Secondary | ICD-10-CM | POA: Diagnosis not present

## 2024-09-29 DIAGNOSIS — E785 Hyperlipidemia, unspecified: Secondary | ICD-10-CM | POA: Diagnosis not present

## 2024-09-29 DIAGNOSIS — I2510 Atherosclerotic heart disease of native coronary artery without angina pectoris: Secondary | ICD-10-CM

## 2024-09-29 DIAGNOSIS — I6523 Occlusion and stenosis of bilateral carotid arteries: Secondary | ICD-10-CM

## 2024-09-29 DIAGNOSIS — I739 Peripheral vascular disease, unspecified: Secondary | ICD-10-CM | POA: Diagnosis not present

## 2024-09-29 DIAGNOSIS — I714 Abdominal aortic aneurysm, without rupture, unspecified: Secondary | ICD-10-CM | POA: Diagnosis not present

## 2024-09-29 DIAGNOSIS — I5022 Chronic systolic (congestive) heart failure: Secondary | ICD-10-CM

## 2024-10-26 ENCOUNTER — Encounter: Admitting: Family Medicine

## 2024-10-27 ENCOUNTER — Ambulatory Visit (HOSPITAL_COMMUNITY)

## 2024-10-28 ENCOUNTER — Ambulatory Visit (HOSPITAL_COMMUNITY)

## 2024-11-05 ENCOUNTER — Ambulatory Visit: Admitting: Physician Assistant

## 2024-11-05 ENCOUNTER — Ambulatory Visit (HOSPITAL_COMMUNITY)

## 2024-12-14 ENCOUNTER — Ambulatory Visit (HOSPITAL_COMMUNITY)

## 2024-12-14 ENCOUNTER — Ambulatory Visit: Admitting: Physician Assistant

## 2025-03-01 ENCOUNTER — Ambulatory Visit
